# Patient Record
Sex: Female | Born: 1950 | Race: White | Hispanic: No | Smoking: Former smoker
Health system: Southern US, Community
[De-identification: ages and names within clinical notes are randomized; demographics above are authoritative.]

## PROBLEM LIST (undated history)

## (undated) DIAGNOSIS — Z720 Tobacco use: Secondary | ICD-10-CM

## (undated) DIAGNOSIS — F32A Depression, unspecified: Secondary | ICD-10-CM

## (undated) DIAGNOSIS — E785 Hyperlipidemia, unspecified: Secondary | ICD-10-CM

## (undated) DIAGNOSIS — N183 Chronic kidney disease, stage 3 unspecified: Secondary | ICD-10-CM

## (undated) DIAGNOSIS — I2510 Atherosclerotic heart disease of native coronary artery without angina pectoris: Secondary | ICD-10-CM

## (undated) DIAGNOSIS — I5022 Chronic systolic (congestive) heart failure: Secondary | ICD-10-CM

## (undated) DIAGNOSIS — Z8719 Personal history of other diseases of the digestive system: Secondary | ICD-10-CM

## (undated) DIAGNOSIS — I255 Ischemic cardiomyopathy: Secondary | ICD-10-CM

## (undated) DIAGNOSIS — I219 Acute myocardial infarction, unspecified: Secondary | ICD-10-CM

## (undated) DIAGNOSIS — I10 Essential (primary) hypertension: Secondary | ICD-10-CM

## (undated) DIAGNOSIS — I509 Heart failure, unspecified: Secondary | ICD-10-CM

## (undated) DIAGNOSIS — M1990 Unspecified osteoarthritis, unspecified site: Secondary | ICD-10-CM

## (undated) DIAGNOSIS — F329 Major depressive disorder, single episode, unspecified: Secondary | ICD-10-CM

## (undated) HISTORY — DX: Atherosclerotic heart disease of native coronary artery without angina pectoris: I25.10

## (undated) HISTORY — DX: Chronic systolic (congestive) heart failure: I50.22

## (undated) HISTORY — DX: Acute myocardial infarction, unspecified: I21.9

## (undated) HISTORY — DX: Hyperlipidemia, unspecified: E78.5

## (undated) HISTORY — DX: Major depressive disorder, single episode, unspecified: F32.9

## (undated) HISTORY — PX: CORONARY ANGIOPLASTY: SHX604

## (undated) HISTORY — PX: OTHER SURGICAL HISTORY: SHX169

## (undated) HISTORY — DX: Essential (primary) hypertension: I10

## (undated) HISTORY — DX: Depression, unspecified: F32.A

## (undated) HISTORY — DX: Tobacco use: Z72.0

## (undated) HISTORY — DX: Ischemic cardiomyopathy: I25.5

---

## 1998-11-12 ENCOUNTER — Ambulatory Visit (HOSPITAL_COMMUNITY): Admission: RE | Admit: 1998-11-12 | Discharge: 1998-11-12 | Payer: Self-pay | Admitting: Obstetrics and Gynecology

## 1998-11-12 ENCOUNTER — Encounter: Payer: Self-pay | Admitting: Obstetrics and Gynecology

## 1998-12-04 ENCOUNTER — Other Ambulatory Visit: Admission: RE | Admit: 1998-12-04 | Discharge: 1998-12-04 | Payer: Self-pay | Admitting: Obstetrics and Gynecology

## 1999-12-10 ENCOUNTER — Other Ambulatory Visit: Admission: RE | Admit: 1999-12-10 | Discharge: 1999-12-10 | Payer: Self-pay | Admitting: Obstetrics and Gynecology

## 2000-08-06 ENCOUNTER — Encounter: Payer: Self-pay | Admitting: Family Medicine

## 2000-08-06 ENCOUNTER — Encounter: Admission: RE | Admit: 2000-08-06 | Discharge: 2000-08-06 | Payer: Self-pay | Admitting: Family Medicine

## 2000-12-05 ENCOUNTER — Encounter: Payer: Self-pay | Admitting: Emergency Medicine

## 2000-12-06 ENCOUNTER — Inpatient Hospital Stay (HOSPITAL_COMMUNITY): Admission: EM | Admit: 2000-12-06 | Discharge: 2000-12-09 | Payer: Self-pay | Admitting: Emergency Medicine

## 2001-06-14 ENCOUNTER — Other Ambulatory Visit: Admission: RE | Admit: 2001-06-14 | Discharge: 2001-06-14 | Payer: Self-pay | Admitting: Obstetrics and Gynecology

## 2001-06-27 ENCOUNTER — Encounter: Admission: RE | Admit: 2001-06-27 | Discharge: 2001-06-27 | Payer: Self-pay | Admitting: Obstetrics and Gynecology

## 2001-06-27 ENCOUNTER — Encounter: Payer: Self-pay | Admitting: Obstetrics and Gynecology

## 2002-03-01 ENCOUNTER — Encounter: Payer: Self-pay | Admitting: *Deleted

## 2002-03-01 ENCOUNTER — Inpatient Hospital Stay (HOSPITAL_COMMUNITY): Admission: EM | Admit: 2002-03-01 | Discharge: 2002-03-04 | Payer: Self-pay | Admitting: *Deleted

## 2002-08-14 ENCOUNTER — Encounter: Payer: Self-pay | Admitting: Obstetrics and Gynecology

## 2002-08-14 ENCOUNTER — Encounter: Admission: RE | Admit: 2002-08-14 | Discharge: 2002-08-14 | Payer: Self-pay | Admitting: Obstetrics and Gynecology

## 2004-10-29 ENCOUNTER — Ambulatory Visit: Payer: Self-pay | Admitting: Cardiovascular Disease

## 2004-11-07 ENCOUNTER — Ambulatory Visit: Payer: Self-pay

## 2004-11-21 ENCOUNTER — Ambulatory Visit: Payer: Self-pay | Admitting: Cardiovascular Disease

## 2004-12-22 ENCOUNTER — Ambulatory Visit: Payer: Self-pay | Admitting: Cardiovascular Disease

## 2005-03-06 ENCOUNTER — Ambulatory Visit: Payer: Self-pay | Admitting: Cardiovascular Disease

## 2005-09-15 ENCOUNTER — Ambulatory Visit: Payer: Self-pay | Admitting: Cardiovascular Disease

## 2005-11-06 ENCOUNTER — Ambulatory Visit: Payer: Self-pay | Admitting: Cardiovascular Disease

## 2005-11-06 ENCOUNTER — Ambulatory Visit: Payer: Self-pay

## 2006-11-01 ENCOUNTER — Ambulatory Visit: Payer: Self-pay | Admitting: Cardiovascular Disease

## 2006-11-04 ENCOUNTER — Ambulatory Visit: Payer: Self-pay | Admitting: Cardiovascular Disease

## 2006-11-04 ENCOUNTER — Inpatient Hospital Stay (HOSPITAL_BASED_OUTPATIENT_CLINIC_OR_DEPARTMENT_OTHER): Admission: RE | Admit: 2006-11-04 | Discharge: 2006-11-04 | Payer: Self-pay | Admitting: Cardiovascular Disease

## 2006-11-11 ENCOUNTER — Ambulatory Visit: Payer: Self-pay | Admitting: Cardiovascular Disease

## 2008-11-08 ENCOUNTER — Ambulatory Visit: Payer: Self-pay | Admitting: Cardiovascular Disease

## 2008-11-26 ENCOUNTER — Ambulatory Visit (HOSPITAL_COMMUNITY): Admission: RE | Admit: 2008-11-26 | Discharge: 2008-11-26 | Payer: Self-pay | Admitting: Cardiovascular Disease

## 2008-11-26 ENCOUNTER — Ambulatory Visit: Payer: Self-pay | Admitting: Cardiovascular Disease

## 2008-12-28 ENCOUNTER — Ambulatory Visit: Payer: Self-pay | Admitting: Internal Medicine

## 2009-01-17 ENCOUNTER — Ambulatory Visit: Payer: Self-pay | Admitting: Internal Medicine

## 2009-01-17 LAB — CONVERTED CEMR LAB
BUN: 9 mg/dL (ref 6–23)
Basophils Absolute: 0.1 10*3/uL (ref 0.0–0.1)
Basophils Relative: 0.8 % (ref 0.0–3.0)
Chloride: 102 meq/L (ref 96–112)
Creatinine, Ser: 0.7 mg/dL (ref 0.4–1.2)
Eosinophils Absolute: 0.2 10*3/uL (ref 0.0–0.7)
Eosinophils Relative: 2.5 % (ref 0.0–5.0)
GFR calc Af Amer: 110 mL/min
GFR calc non Af Amer: 91 mL/min
HCT: 36.3 % (ref 36.0–46.0)
MCHC: 34.9 g/dL (ref 30.0–36.0)
MCV: 93.1 fL (ref 78.0–100.0)
Monocytes Absolute: 0.7 10*3/uL (ref 0.1–1.0)
Neutrophils Relative %: 63 % (ref 43.0–77.0)
Platelets: 221 10*3/uL (ref 150–400)
Potassium: 3.6 meq/L (ref 3.5–5.1)
WBC: 8.4 10*3/uL (ref 4.5–10.5)
aPTT: 28 s (ref 21.7–29.8)

## 2009-01-18 ENCOUNTER — Inpatient Hospital Stay (HOSPITAL_COMMUNITY): Admission: AD | Admit: 2009-01-18 | Discharge: 2009-01-19 | Payer: Self-pay | Admitting: Internal Medicine

## 2009-01-18 ENCOUNTER — Ambulatory Visit: Payer: Self-pay | Admitting: Internal Medicine

## 2009-01-18 HISTORY — PX: CARDIAC DEFIBRILLATOR PLACEMENT: SHX171

## 2009-02-01 ENCOUNTER — Encounter: Payer: Self-pay | Admitting: Internal Medicine

## 2009-02-04 ENCOUNTER — Ambulatory Visit: Payer: Self-pay

## 2009-05-01 DIAGNOSIS — I10 Essential (primary) hypertension: Secondary | ICD-10-CM

## 2009-05-01 DIAGNOSIS — I2589 Other forms of chronic ischemic heart disease: Secondary | ICD-10-CM

## 2009-05-01 DIAGNOSIS — F329 Major depressive disorder, single episode, unspecified: Secondary | ICD-10-CM

## 2009-05-01 DIAGNOSIS — I219 Acute myocardial infarction, unspecified: Secondary | ICD-10-CM | POA: Insufficient documentation

## 2009-05-01 DIAGNOSIS — I2510 Atherosclerotic heart disease of native coronary artery without angina pectoris: Secondary | ICD-10-CM

## 2009-05-09 ENCOUNTER — Ambulatory Visit: Payer: Self-pay | Admitting: Internal Medicine

## 2009-09-02 ENCOUNTER — Telehealth (INDEPENDENT_AMBULATORY_CARE_PROVIDER_SITE_OTHER): Payer: Self-pay | Admitting: *Deleted

## 2009-11-15 ENCOUNTER — Ambulatory Visit: Payer: Self-pay | Admitting: Internal Medicine

## 2010-08-21 ENCOUNTER — Encounter (INDEPENDENT_AMBULATORY_CARE_PROVIDER_SITE_OTHER): Payer: Self-pay | Admitting: *Deleted

## 2010-10-13 ENCOUNTER — Telehealth: Payer: Self-pay | Admitting: Cardiovascular Disease

## 2010-12-09 ENCOUNTER — Encounter (INDEPENDENT_AMBULATORY_CARE_PROVIDER_SITE_OTHER): Payer: Self-pay | Admitting: *Deleted

## 2010-12-28 ENCOUNTER — Encounter: Payer: Self-pay | Admitting: Cardiovascular Disease

## 2011-01-05 ENCOUNTER — Encounter: Payer: Self-pay | Admitting: Internal Medicine

## 2011-01-06 NOTE — Progress Notes (Signed)
Summary: pt needs refill   Phone Note Refill Request Call back at Home Phone [phone] Message from:  Patient on [employer] home delivery  Refills Requested: Medication #1:  CRESTOR 20 MG TABS Take one tablet by mouth daily. pt needs a 45 day supply she cant afford the 90day  Initial call taken by: Shelda Pal,  October 13, 2010 8:58 AM    Prescriptions: CRESTOR 20 MG TABS (ROSUVASTATIN CALCIUM) Take one tablet by mouth daily.  #45 x 4   Entered by:   Burnett Kanaris   Authorized by:   Bosie Clos, MD, [facility]   Signed by:   Burnett Kanaris on 10/13/2010   Method used:   Faxed to ...       [address] [facility] (mail-order)       [place]  [address]       Ph: [phone]       Fax: [phone]   RxID:   [id number]

## 2011-01-06 NOTE — Letter (Signed)
Summary: Device-Delinquent Check  [place], [place] [address] [place]   [place], [place] [address]   Phone: [phone]  Fax: [phone]     August 21, 2010 MRN: [id number]   [place], [place]  [address]   Dear [name],  According to our records, you have not had your implanted device checked in the recommended period of time.  We are unable to determine appropriate device function without checking your device on a regular basis.  Please call our office to schedule an appointment, with Dr. Ladon Applebaum soon as possible.  If you are having your device checked by another physician, please call us so that we may update our records.  Thank you, Terri Friendly, [name]  September 15, 624THL 3:24 PM   [facility]

## 2011-01-08 NOTE — Letter (Signed)
Summary: Device-Delinquent Check  [place], [place] [address] [place]   [place], [place] [address]   Phone: [phone]  Fax: [phone]     December 09, 2010 MRN: [id number]   [place], [place]  [address]   Dear Ms. Widmann,  According to our records, you have not had your implanted device checked in the recommended period of time.  We are unable to determine appropriate device function without checking your device on a regular basis.  Please call our office to schedule an appointment, with Dr Rayann Heman, as soon as possible.  If you are having your device checked by another physician, please call us so that we may update our records.  Thank you,  Gasper Sells, EMT  December 09, 2010 2:15 PM  [facility]

## 2011-01-28 NOTE — Cardiovascular Report (Signed)
Summary: Certified Letter Returned - Not doing f/u  Certified Letter Returned - Not doing f/u   Imported By: Ranell Patrick 01/19/2011 11:26:12  _____________________________________________________________________  External Attachment:    Type:   Image     Comment:   External Document

## 2011-01-30 ENCOUNTER — Encounter: Payer: Self-pay | Admitting: Cardiovascular Disease

## 2011-01-30 ENCOUNTER — Ambulatory Visit (INDEPENDENT_AMBULATORY_CARE_PROVIDER_SITE_OTHER): Payer: Managed Care, Other (non HMO) | Admitting: Cardiovascular Disease

## 2011-01-30 DIAGNOSIS — Z720 Tobacco use: Secondary | ICD-10-CM | POA: Insufficient documentation

## 2011-01-30 DIAGNOSIS — R0989 Other specified symptoms and signs involving the circulatory and respiratory systems: Secondary | ICD-10-CM | POA: Insufficient documentation

## 2011-01-30 DIAGNOSIS — I2510 Atherosclerotic heart disease of native coronary artery without angina pectoris: Secondary | ICD-10-CM

## 2011-01-30 DIAGNOSIS — I252 Old myocardial infarction: Secondary | ICD-10-CM

## 2011-01-30 DIAGNOSIS — E785 Hyperlipidemia, unspecified: Secondary | ICD-10-CM

## 2011-02-03 NOTE — Assessment & Plan Note (Signed)
Summary: F1Y PER PT CALL-MJ/JT  Medications Added LIPITOR 40 MG TABS (ATORVASTATIN CALCIUM) Take one tablet by mouth daily. PULMICORT FLEXHALER 180 MCG/ACT AEPB (BUDESONIDE) as needed ALLEGRA ALLERGY 180 MG TABS (FEXOFENADINE HCL) 1 tab by mouth once daily ZYBAN 150 MG XR12H-TAB (BUPROPION HCL (SMOKING DETER)) one tablet once daily x 3 days then one tabklet two times a day      Allergies Added: NKDA  Primary Provider:  [facility]    History of Present Illness: 60 yo ischemic DCM with previous intervention to LAD and RCA.  EF by MRI 24% with significant apical and inferior scar.  Prophylactic AICD placed by Dr Rayann Heman.  2/10.  Normal device function on routine device clinic F/U.  She is still depressed.  Mothr died in 08-Aug-2023.  Still smoking.  Has program at work Failed hypnosis and gum.  Told her we would start Zyban and patches.  Link to vascular disease discussed.  No SSCP, dyspnea, palpitaitons or device D/C  Current Problems (verified): 1)  Implantation of Defibrillator, Medtronic  (ICD-V45.02) 2)  Myocardial Infarction  (ICD-410.90) 3)  Depression  (ICD-311) 4)  Hypertension  (ICD-401.9) 5)  Cad  (ICD-414.00) 6)  Cardiomyopathy, Ischemic  (ICD-414.8)  Current Medications (verified): 1)  Furosemide 20 Mg Tabs (Furosemide) .... Take One Tablet By Mouth Daily. 2)  Ramipril 10 Mg Caps (Ramipril) .... Take One Capsule By Mouth Daily 3)  Crestor 10 Mg Tabs (Rosuvastatin Calcium) .... Take One Tablet By Mouth Daily. 4)  Adult Aspirin Low Strength 81 Mg Tbdp (Aspirin) .... Once Daily 5)  Prilosec 20 Mg Cpdr (Omeprazole) .... Take One Once Daily 6)  Paroxetine Hcl 30 Mg Tabs (Paroxetine Hcl) .... Take One Tablet By Mouth Once Daily 7)  Metoprolol Succinate 50 Mg Xr24h-Tab (Metoprolol Succinate) .... Take One Tablet By Mouth Once Daily 8)  Vitamin B-12 Cr 1000 Mcg Cr-Tabs (Cyanocobalamin) .... Take One Tab At Bedtime 9)  Vitamin C 500 Mg Tabs (Ascorbic Acid) .... Take One Tablet By Mouth  Once Daily. 10)  Fish Oil   Oil (Fish Oil) .... Take One Tablet By Mouth Once Daily. 11)  Pulmicort Flexhaler 180 Mcg/act Aepb (Budesonide) .... As Needed 12)  Allegra Allergy 180 Mg Tabs (Fexofenadine Hcl) .Marland Kitchen.. 1 Tab By Mouth Once Daily  Allergies (verified): No Known Drug Allergies  Past History:  Past Medical History: Last updated: 11/15/2009 Current Problems:  IMPLANTATION OF DEFIBRILLATOR, MEDTRONIC (ICD-V45.02) MYOCARDIAL INFARCTION (ICD-410.90) DEPRESSION (ICD-311) HYPERTENSION (ICD-401.9) CAD (ICD-414.00) CARDIOMYOPATHY, ISCHEMIC (ICD-414.8)    Past Surgical History: Last updated: 05/01/2009 implantation of a Medtronic single chamber ICD  01/18/2009  Thompson Grayer, MD Cardiac Catheterization [phone]  Family History: Last updated: 05/01/2009  Her family history is again noncontributory.  Social History: Last updated: 05/01/2009 She is divorced.  She works for [employer].  She has two children, a son of whom died in a car accident at the age of 65.  She does not use alcohol or recreational drugs.  Review of Systems       Denies fever, malais, weight loss, blurry vision, decreased visual acuity, cough, sputum, SOB, hemoptysis, pleuritic pain, palpitaitons, heartburn, abdominal pain, melena, lower extremity edema, claudication, or rash.   Vital Signs:  Patient profile:   60 year old female Height:      63 inches Weight:      136 pounds BMI:     24.18 Pulse rate:   89 / minute Resp:     14 per minute BP sitting:   119 /  60  (left arm)  Vitals Entered By: Burnett Kanaris (January 30, 2011 4:01 PM)  Physical Exam  General:  Affect appropriate Healthy:  appears stated age 60: normal Neck supple with no adenopathy JVP normal left  bruits no thyromegaly Lungs clear with mild wheezing and good diaphragmatic motion Heart:  S1/S2 no murmur,rub, gallop or click PMI normal Abdomen: benighn, BS positve, no tenderness, no AAA no bruit.  No HSM or  HJR Distal pulses intact with no bruits No edema Neuro non-focal Skin warm and dry     ICD Specifications Following MD:  Thompson Grayer, MD     ICD Vendor:  Medtronic     ICD Model Number:  [place]     ICD Serial Number:  [id number] ICD DOI:  01/18/2009     ICD Implanting MD:  Thompson Grayer, MD  Lead 1:    Location: RV     DOI: 01/18/2009     Model #: [id number]     Serial #[id number]     Status: active  Indications::  ICM   Episodes Coumadin:  No  Brady Parameters Mode VVI     Lower Rate Limit:  40      Tachy Zones VF:  200     VT:  off     VT1:  off     Impression & Recommendations:  Problem # 1:  IMPLANTATION OF DEFIBRILLATOR, MEDTRONIC (ICD-V45.02) F/U cllinci normal device funtion  Problem # 2:  CAD (ICD-414.00)  Stable no angina Her updated medication list for this problem includes:    Ramipril 10 Mg Caps (Ramipril) .Marland Kitchen... Take one capsule by mouth daily    Adult Aspirin Low Strength 81 Mg Tbdp (Aspirin) ..... Once daily    Metoprolol Succinate 50 Mg Xr24h-tab (Metoprolol succinate) .Marland Kitchen... Take one tablet by mouth once daily  Her updated medication list for this problem includes:    Ramipril 10 Mg Caps (Ramipril) .Marland Kitchen... Take one capsule by mouth daily    Adult Aspirin Low Strength 81 Mg Tbdp (Aspirin) ..... Once daily    Metoprolol Succinate 50 Mg Xr24h-tab (Metoprolol succinate) .Marland Kitchen... Take one tablet by mouth once daily  Problem # 3:  CARDIOMYOPATHY, ISCHEMIC (ICD-414.8)  No active CHF euvolemic  Cotninue current meds Her updated medication list for this problem includes:    Furosemide 20 Mg Tabs (Furosemide) .Marland Kitchen... Take one tablet by mouth daily.    Ramipril 10 Mg Caps (Ramipril) .Marland Kitchen... Take one capsule by mouth daily    Adult Aspirin Low Strength 81 Mg Tbdp (Aspirin) ..... Once daily    Metoprolol Succinate 50 Mg Xr24h-tab (Metoprolol succinate) .Marland Kitchen... Take one tablet by mouth once daily  Her updated medication list for this problem includes:    Furosemide 20 Mg Tabs  (Furosemide) .Marland Kitchen... Take one tablet by mouth daily.    Ramipril 10 Mg Caps (Ramipril) .Marland Kitchen... Take one capsule by mouth daily    Adult Aspirin Low Strength 81 Mg Tbdp (Aspirin) ..... Once daily    Metoprolol Succinate 50 Mg Xr24h-tab (Metoprolol succinate) .Marland Kitchen... Take one tablet by mouth once daily  Problem # 4:  CAROTID BRUIT, LEFT (ICD-785.9) Knwon vascular diseae and smoking Carotid duplex ordered  Problem # 5:  HYPERTENSION (ICD-401.9)  Well controlled Her updated medication list for this problem includes:    Furosemide 20 Mg Tabs (Furosemide) .Marland Kitchen... Take one tablet by mouth daily.    Ramipril 10 Mg Caps (Ramipril) .Marland Kitchen... Take one capsule by mouth daily    Adult Aspirin Low  Strength 81 Mg Tbdp (Aspirin) ..... Once daily    Metoprolol Succinate 50 Mg Xr24h-tab (Metoprolol succinate) .Marland Kitchen... Take one tablet by mouth once daily  Her updated medication list for this problem includes:    Furosemide 20 Mg Tabs (Furosemide) .Marland Kitchen... Take one tablet by mouth daily.    Ramipril 10 Mg Caps (Ramipril) .Marland Kitchen... Take one capsule by mouth daily    Adult Aspirin Low Strength 81 Mg Tbdp (Aspirin) ..... Once daily    Metoprolol Succinate 50 Mg Xr24h-tab (Metoprolol succinate) .Marland Kitchen... Take one tablet by mouth once daily  Problem # 6:  SMOKER (ICD-305.1) F/U work place paln  Zyban started and encouraged to use 14mg  patch  Other Orders: Carotid Duplex (Carotid Duplex)  Patient Instructions: 1)  Your physician has recommended you make the following change in your medication: stop crestor 2)  START LIPITOR 40MG  ONCE DAILY 3)  Your physician wants you to follow-up in: [place] will receive a reminder letter in the mail two months in advance. If you don't receive a letter, please call our office to schedule the follow-up appointment. 4)  Your physician has requested that you have a carotid duplex. This test is an ultrasound of the carotid arteries in your neck. It looks at blood flow through these arteries that  supply the brain with blood. Allow one hour for this exam. There are no restrictions or special instructions. Prescriptions: ZYBAN 150 MG XR12H-TAB (BUPROPION HCL (SMOKING DETER)) one tablet once daily x 3 days then one tabklet two times a day  #64 x 1   Entered by:   Fredia Beets, RN   Authorized by:   Bosie Clos, MD, [facility]   Signed by:   Fredia Beets, RN on 01/30/2011   Method used:   Print then Give to Patient   RxID:   [id number] LIPITOR 40 MG TABS (ATORVASTATIN CALCIUM) Take one tablet by mouth daily.  #90 x 4   Entered by:   Fredia Beets, RN   Authorized by:   Bosie Clos, MD, [facility]   Signed by:   Fredia Beets, RN on 01/30/2011   Method used:   Faxed to ...       [address] [facility] (mail-order)       [place]  [address]       Ph: [phone]       Fax: [phone]   RxID:   [id number]

## 2011-02-04 ENCOUNTER — Encounter: Payer: Self-pay | Admitting: Internal Medicine

## 2011-02-13 ENCOUNTER — Encounter: Payer: Self-pay | Admitting: Cardiovascular Disease

## 2011-02-13 ENCOUNTER — Encounter (INDEPENDENT_AMBULATORY_CARE_PROVIDER_SITE_OTHER): Payer: Managed Care, Other (non HMO)

## 2011-02-13 DIAGNOSIS — I6529 Occlusion and stenosis of unspecified carotid artery: Secondary | ICD-10-CM

## 2011-02-13 DIAGNOSIS — R0989 Other specified symptoms and signs involving the circulatory and respiratory systems: Secondary | ICD-10-CM

## 2011-03-12 ENCOUNTER — Telehealth: Payer: Self-pay | Admitting: Cardiovascular Disease

## 2011-03-12 NOTE — Telephone Encounter (Signed)
Furosemide 30 day supply to [facility]

## 2011-03-13 MED ORDER — FUROSEMIDE 20 MG PO TABS: 20.0000 mg | ORAL_TABLET | Freq: Every day | ORAL | Status: DC

## 2011-03-18 ENCOUNTER — Other Ambulatory Visit: Payer: Self-pay | Admitting: *Deleted

## 2011-03-19 ENCOUNTER — Other Ambulatory Visit: Payer: Self-pay | Admitting: *Deleted

## 2011-03-19 MED ORDER — FUROSEMIDE 20 MG PO TABS: 20.0000 mg | ORAL_TABLET | Freq: Every day | ORAL | Status: DC

## 2011-04-01 ENCOUNTER — Ambulatory Visit (INDEPENDENT_AMBULATORY_CARE_PROVIDER_SITE_OTHER): Payer: Managed Care, Other (non HMO) | Admitting: Internal Medicine

## 2011-04-01 ENCOUNTER — Encounter: Payer: Self-pay | Admitting: Internal Medicine

## 2011-04-01 DIAGNOSIS — I2589 Other forms of chronic ischemic heart disease: Secondary | ICD-10-CM

## 2011-04-01 DIAGNOSIS — I5023 Acute on chronic systolic (congestive) heart failure: Secondary | ICD-10-CM | POA: Insufficient documentation

## 2011-04-01 DIAGNOSIS — I10 Essential (primary) hypertension: Secondary | ICD-10-CM

## 2011-04-01 DIAGNOSIS — I509 Heart failure, unspecified: Secondary | ICD-10-CM

## 2011-04-01 DIAGNOSIS — F17200 Nicotine dependence, unspecified, uncomplicated: Secondary | ICD-10-CM

## 2011-04-01 DIAGNOSIS — I428 Other cardiomyopathies: Secondary | ICD-10-CM

## 2011-04-01 NOTE — Assessment & Plan Note (Signed)
No ischemic symptoms. Normal defibrillator function See Claudia Desanctis Art report No changes today

## 2011-04-01 NOTE — Patient Instructions (Signed)
Take lasix 40mg  every day for 3 days and then back to 20mg  every day. Start 2gram sodium per day diet. Smoking  Cessation program. Call [phone] or [email or web address] Your physician wants you to follow-up in:  12 months You will receive a reminder letter in the mail two months in advance. If you don't receive a letter, please call our office to schedule the follow-up appointment.

## 2011-04-01 NOTE — Assessment & Plan Note (Signed)
She appears slightly volume overloaded today and optivol is elevated.  She also reports symptoms of CHF, though these are mild. I have increased lasix to 40mg  daily today x 3 days.  She will then weight daily and increase lasix if weight increases by more than 2 lbs. 2 gram salt restriction advised.

## 2011-04-01 NOTE — Progress Notes (Signed)
The patient presents today for routine electrophysiology followup.  Since last being seen in our clinic, the patient reports doing very well.  She reports mild SOB with abdominal distension and orthopnea x 2-3 weeks.  She is unware of any increase in salt and reports compliance with medicine.  Today, she denies symptoms of palpitations, chest pain,lower extremity edema, dizziness, presyncope, syncope, or neurologic sequela.  The patient feels that she is tolerating medications without difficulties and is otherwise without complaint today.   Past Medical History  Diagnosis Date  . Ischemic cardiomyopathy   . CAD (coronary artery disease)   . Chronic systolic dysfunction of left ventricle   . HTN (hypertension)   . Depression    Past Surgical History  Procedure Date  . Cardiac defibrillator placement 01/18/09    [facility]T implanted by [facility]    Current Outpatient Prescriptions  Medication Sig Dispense Refill  . aspirin 81 MG tablet Take 81 mg by mouth daily.        Marland Kitchen atorvastatin (LIPITOR) 40 MG tablet Take 40 mg by mouth daily.        . Budesonide (PULMICORT IN) Inhale into the lungs as directed.        . Cyanocobalamin (VITAMIN B-12 CR PO) Take by mouth as directed.        . fish oil-omega-3 fatty acids 1000 MG capsule Take 2 g by mouth daily.        . furosemide (LASIX) 20 MG tablet Take 1 tablet (20 mg total) by mouth daily.  90 tablet  1  . metoprolol (TOPROL-XL) 50 MG 24 hr tablet Take 50 mg by mouth daily.        Marland Kitchen omeprazole (PRILOSEC) 10 MG capsule Take 10 mg by mouth daily.        Marland Kitchen PARoxetine (PAXIL) 30 MG tablet Take 30 mg by mouth every morning.        . ramipril (ALTACE) 10 MG capsule Take 10 mg by mouth daily.          Not on File  History   Social History  . Marital Status: Divorced    Spouse Name: N/A    Number of Children: N/A  . Years of Education: N/A   Occupational History  . Not on file.   Social History Main Topics  . Smoking status: Current Everyday Smoker  .  Smokeless tobacco: Not on file  . Alcohol Use: No  . Drug Use: No  . Sexually Active: Not on file   Other Topics Concern  . Not on file   Social History Narrative   Lives in [place]    No family history on file.  ROS-  All systems are reviewed and are negative except as outlined in the HPI above  Physical Exam: Filed Vitals:   04/01/11 1309  BP: 100/60  Pulse: 70  Height: 5\' 3"  (1.6 m)  Weight: 135 lb (61.236 kg)    GEN- The patient is well appearing, alert and oriented x 3 today.   Head- normocephalic, atraumatic Eyes-  Sclera clear, conjunctiva pink Ears- hearing intact Oropharynx- clear Neck- supple, JVP9 Lymph- no cervical lymphadenopathy Lungs- few basilar rales, normal work of breathing Chest- ICD pocket is well healed Heart- Regular rate and rhythm, no murmurs, rubs or gallops, PMI not laterally displaced GI- soft, NT, ND, + BS Extremities- no clubbing, cyanosis, or edema MS- no significant deformity or atrophy Skin- no rash or lesion Psych- euthymic mood, full affect Neuro- strength and sensation  are intact  ICD interrogation- reviewed in detail today,  See PACEART report  Assessment and Plan:

## 2011-04-01 NOTE — Assessment & Plan Note (Signed)
Cessation advised today She is not ready to quite

## 2011-04-01 NOTE — Assessment & Plan Note (Signed)
Controlled.  

## 2011-04-02 ENCOUNTER — Encounter: Payer: Self-pay | Admitting: Internal Medicine

## 2011-04-08 ENCOUNTER — Telehealth: Payer: Self-pay | Admitting: Internal Medicine

## 2011-04-08 NOTE — Telephone Encounter (Signed)
Pt. States will fax FMLA papers tomorrow to be fill out and sign.

## 2011-04-08 NOTE — Telephone Encounter (Signed)
Pt needs her fmla paper to be fill out.  Pt is going to be faxing fmla paper today.

## 2011-04-21 NOTE — Assessment & Plan Note (Signed)
[facility]                            CARDIOLOGY OFFICE NOTE   NAME:Lowman, NAZARI TOURANGEAU                        MRN:          [id number]  DATE:11/08/2008                            DOB:          Oct 08, 1951    [name] is seen today in followup.  Unfortunately, last time I saw her was  2 years ago.  She has been under somewhat of a lot of stress.  Her  mother is elderly and has had problems with dementia, hip fracture, and  she needs to work and see her mom on a daily basis.  The patient was  cathed in November 2007.  She has had a previous anterior wall MI with  angioplasty of the LAD and also angioplasty of the RCA.  She had patent  arteries by cath on November 2007.  Unfortunately at that time, however,  her EF was in the 35% range.  Given her coronary artery disease and  ischemic cardiomyopathy, I wanted to restratify her at that time either  in the determine protocol or using MRI to see if she would be a  candidate for defibrillator.  She never followed up with this and never  kept her appointment with Dr. Caryl Comes.  I explained to Shontia that there is  some significant issues regarding her current heart condition and sudden  death.  I still think she should have a cardiac MRI to restratify her.  If EF is below 35% and she has a large scar burden, I still would like  to refer her to possible AICD.   She otherwise has been doing well.  She is not having any significant  chest pain.  She has mild exertional dyspnea.  There has been no  presyncope or palpitations.   She has no allergies.   CURRENT MEDICATIONS:  1. Prozac 20 a day.  2. Toprol 50 a day.  3. Lasix 20 a day.  4. Altace 10.  5. Aspirin a day.  6. Prevacid 30 a day.  7. Zetia 10 a day.  8. Crestor 10 a day.   PHYSICAL EXAMINATION:  GENERAL:  Remarkable for somewhat depressed-  looking female in no distress.  VITAL SIGNS:  Weight is 136, blood pressure 140/82, pulse 75 and  regular,  respiratory rate is 14, afebrile.  HEENT:  Unremarkable.  She has a left carotid bruit just above the  clavicle.  No lymphadenopathy, thyromegaly, or JVP elevation.  LUNGS:  Clear with good diaphragmatic motion.  No wheezing.  HEART:  S1 and S2 with normal heart sounds.  PMI normal.  ABDOMEN:  Benign.  Bowel sounds positive.  No AAA, no tenderness, no  bruit, no hepatosplenomegaly, no hepatojugular reflux, no tenderness.  EXTREMITIES:  Distal pulses are intact.  No edema.  NEURO:  Nonfocal.  SKIN:  Warm and dry.  MUSCULOSKELETAL:  No muscle weakness.   IMPRESSION:  1. Ischemic cardiomyopathy.  Followup cardiac MRI to assess EF and      scar burden possible referral to EP if she has significant scar      burden or decreased EF.  2. Hypertension, currently well controlled.  Continue current dose of      diuretic and ACE inhibitor.  3. Hypercholesterolemia.  Lipid and liver profile in 6 months.      Continue Zetia and Crestor.  4. Left carotid bruit.  Looking back to our notes, she did have a      carotid duplex study in 2007.  At that time, there was an ECA      stenosis but no ICA stenosis.  I suspect this is a murmur that I      continue to hear.  I do not think she needs a followup carotid      duplex at this time.   Overall I think, the patient is stable.  Further recommendations will be  based on the results of her cardiac MRI.  Her EKG today did show sinus  rhythm with a previous anterior and inferior wall infarction.     [name]. Johnsie Cancel, MD, [facility]  Electronically Signed    PCN/MedQ  DD: 11/08/2008  DT: 11/09/2008  Job #: [id number]

## 2011-04-21 NOTE — Letter (Signed)
December 28, 2008    [name]. Johnsie Cancel, [place], [address], [place] [address]   RE:  Terri, Bowen  MRN:  [id number]  /  DOB:  [birth year]   Dear Collier Salina,   It was my pleasure to see your patient [name] in Electrophysiology  consultation today for consideration of ICD implantation.  As you  recall, 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  She is  status post distant anterior myocardial infarction with stenting of the  left anterior descending and diagonal arteries in 2001.  She had a  repeat left heart catheterization in November 2007, which revealed an  ejection fraction of 35% with patent stents and no significant  progression of her coronary artery disease.  She most recently had a  cardiac MRI performed on November 26, 2008, which revealed severe left  ventricular cavitary enlargement with an inferolateral and apical scar  and an ejection fraction of 24%.  There was noted to be full-thickness  scar of the inferolateral wall, distal septum, and apex.  A 2/3  thickness scar was also noted over the mid and apical inferior wall.  She is therefore referred for consideration of an implantable cardiac  defibrillator.   The patient reports doing quite well.  She typically is limited due to  shortness breath and leg weakness with moderate activities.  She denies  chest pain, palpitations, presyncope, or syncope.  She has not had  significant orthopnea, paroxysmal nocturnal dyspnea, or lower extremity  edema.  She is chronically compliant with her medical therapy for her  ischemic cardiomyopathy and has been maintaining an optimal medical  therapy.  Unfortunately, she continues to smoke, but does have interest  in cessation.   PAST MEDICAL  HISTORY:  1. Coronary artery disease, status post percutaneous coronary      intervention of the LAD.  2. Ischemic cardiomyopathy (as above).  3. New York Heart Association class II/III heart failure symptoms      chronically.  4. Hypertension.  5. Depression.   ALLERGIES:  No known drug allergies.   MEDICATIONS:  1. Aspirin 81 mg daily.  2. Toprol-XL 50 mg daily.  3. Lasix 20 mg daily.  4. Altace 10 mg daily.  5. Paroxetine 20 mg daily.  6. Prevacid 30 mg daily.  7. Zetia 10 mg daily.  8. Crestor 10 mg daily.  9. Vitamin D daily.  10.Vitamin B12 daily.  11.Albuterol p.r.n.  12.Nitroglycerin p.r.n.   SOCIAL HISTORY:  The patient lives in [place].  She works in  Geophysical data processor for [employer].  She has a 40 pack-year history of tobacco, which is  ongoing.  She denies alcohol or drug use.   FAMILY HISTORY:  Notable for diabetes, coronary artery disease,  cerebrovascular disease, and cancer.   REVIEW OF SYSTEMS:  All systems reviewed and negative except as outlined  in the HPI above.   PHYSICAL EXAMINATION:  VITAL SIGNS:  Blood pressure 130/70, heart rate  96, respirations 18, and weight 137 pounds.  GENERAL:  The patient is a thin, well-appearing female in no acute  distress.  She is alert and oriented x3.  HEENT:  Normocephalic and atraumatic.  Sclerae clear.  Conjunctivae  pink.  Oropharynx clear.  NECK:  Supple.  No JVD, thyromegaly, or  bruits.  LUNGS:  Clear to auscultation bilaterally.  HEART:  Regular rate and rhythm.  No murmurs, rubs, or gallops.  GI:  Soft, nontender, and nondistended.  Positive bowel sounds.  EXTREMITIES:  No clubbing, cyanosis, or edema.  NEUROLOGIC:  Strength and sensation are intact.  SKIN:  No ecchymosis or lacerations.  MUSCULOSKELETAL:  No deformity or atrophy.  PSYCH:  Euthymic mood and full affect.   EKG performed on November 08, 2008, reveals sinus rhythm with a PR  interval of 180 milliseconds.  The QRS duration is 94 milliseconds.  An   anterolateral infarct pattern is observed.  Left axis deviation is also  noted.   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  She meets SCD-HeFT made it to  criteria for implantable cardioverter-defibrillator implantation.  Risks, benefits, and alternatives to implantable cardioverter-  defibrillator implantation were discussed in detail with the patient  today.  These risk include but are not limited to infection, bleeding,  vascular damage, pericardial effusion, tamponade, pneumothorax, lead  dislodgement, cardiac perforation, and malfunctioning/recall of the  device.  She is also aware that inappropriate shocks could possibly be  delivered; however, device programming should minimize this.  She  understands these risks and wishes to proceed.   PLAN:  We will schedule the patient for a single chamber ICD on January 18, 2009.     Sincerely,      Thompson Grayer, MD  Electronically Signed    JA/MedQ  DD: 12/28/2008  DT: 12/29/2008  Job #: [id number]   CC:    Stanford [place], FNP

## 2011-04-21 NOTE — Op Note (Signed)
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   PREPROCEDURE DIAGNOSES:  1. Ischemic cardiomyopathy.  2. Coronary artery disease.  3. New York Heart Association class II/III heart failure.   POSTPROCEDURE DIAGNOSES:  1. Ischemic cardiomyopathy.  2. Coronary artery disease.  3. New York Heart Association class II/III heart failure.   PROCEDURES:  1. ICD implantation.  2. Programmed extra stimulus testing.   DESCRIPTION OF PROCEDURE:  Informed written consent was obtained and the  patient was brought to the Electrophysiology Lab in a fasting state.  She was adequately sedated with intravenous Versed and fentanyl as  outlined in the nursing report.  The patient's left chest was prepped  and draped in the usual sterile fashion by the EP lab staff.  The skin  overlying the left deltopectoral region was infiltrated with lidocaine  for local analgesia.  A 5-cm incision was then made over the left  deltopectoral region.  The left subcutaneous pacemaker pocket was  fashioned using a combination of sharp and blunt dissection.  Electrocautery was used to assure hemostasis.  The left cephalic vein  was visualized and cannulated.  Through this vein, a Medtronic [employer] operational officer model [id number] (serial number [id number]) defibrillator  lead was advanced into the right atrial appendage with fluoroscopic  visualization.  The  lead was then actively secured to the pectoralis  fascia using #2 silk suture over the suture sleeve.  Initial lead  measurements revealed an R-wave of 14.5 mV with an impedance of 1081  ohms and a threshold of 0.9 volts at 0.5 milliseconds.  The lead was  then connected to a Medtronic Virtuoso VR model D154VWC (serial number  [id number]) single chamber ICD.  The ICD pocket was irrigated with  copious gentamicin solution.  The device was then placed into the  pocket.  The pocket was closed with 2.0 Vicryl suture for the  subcutaneous and subcuticular layers.  Steri-Strips and a sterile  dressing were then applied.  Ventricular fibrillation was induced with a  T-shock and ventricular fibrillation was adequately detected with no  signal dropout with a sensitivity programmed at 1.2 mV.  The patient was  successfully defibrillated with a single 10 joules shock delivered  through the device with an impedance of 75 ohms.  Programmed extra  stimulus testing was performed with a basic cycle length of 500  milliseconds through the device from the right ventricular apex with S1,  S2, S3, S4 extrastimuli down to refractoriness with no ventricular  tachycardia  induced.  The procedure was therefore considered completed.  There were no early apparent complications.   CONCLUSIONS:  1. Successful implantation of a Medtronic single chamber ICD for      primary prevention of sudden cardiac death as described above.  2. Defibrillation threshold less than or equal to 10 joules.  3. No inducible ventricular tachycardia with programmed extra stimulus      testing with a basic cycle length of 500 milliseconds with S1, S2,      S3, S4 extrastimuli down to refractoriness.  4. No early apparent complications.      Thompson Grayer, MD  Electronically Signed     JA/MEDQ  D:  01/18/2009  T:  01/19/2009  Job:  [id number]   cc:   [name]. Johnsie Cancel, MD, [facility]  Maureen Dr. Armandina Gemma

## 2011-04-21 NOTE — Discharge Summary (Signed)
[name], [name]                 ACCOUNT NO.:  [id number]   MEDICAL RECORD NO.:  [id number]          PATIENT TYPE:  INP   LOCATION:  2041                         FACILITY:  [facility]   PHYSICIAN:  Satira Sark, MD DATE OF BIRTH:  12-03-1951   DATE OF ADMISSION:  01/18/2009  DATE OF DISCHARGE:  01/19/2009                               DISCHARGE SUMMARY   PRIMARY CARDIOLOGIST:  Collier Salina C. Johnsie Cancel, MD, [facility]   PRIMARY ELECTROPHYSIOLOGIST:  Thompson Grayer, MD   FINAL DISCHARGE DIAGNOSES:  1. Severe ischemic cardiomyopathy.      a.     Status post Medtronic single-chamber defibrillator, by Dr.       Thompson Grayer, for primary prophylaxis.      b.     Ejection fraction approximately 25%.      c.     New York Heart Association Class II/III heart failure       symptoms.      d.     Status post remote anterior myocardial infarction, treated       with percutaneous intervention of left anterior descending and       diagonal arteries in 2001.   SECONDARY DIAGNOSES:  1. Chronic systolic heart failure.  2. Hypertension.  3. Dyslipidemia.  4. Depression.   REASON FOR ADMISSION:  [name] is a 60 year old female, followed by  Dr. Jenkins Rouge, with history of severe ischemic cardiomyopathy.  She  was recently referred to Dr. Thompson Grayer for formal EP evaluation  regarding risk stratification for sudden cardiac death.  Recommendation  was to proceed with defibrillator implantation.   HOSPITAL COURSE:  The patient underwent placement of a Medtronic single-  chamber defibrillator, by Dr. Thompson Grayer, on day of admission, with no  noted complications.  She was kept for overnight observation, cleared  for discharge the following morning in hemodynamically stable condition.  Postop ICD interrogation notable for no adjustments indicated.   The patient will resume all previous home medications.   Postop chest x-ray was negative.   DISCHARGE LABORATORIES:  None.   DISCHARGE MEDICATIONS:  1.  Toprol-XL 50 mg daily.  2. Furosemide 20 mg daily.  3. Altace 10 mg daily.  4. Aspirin 81 mg daily.  5. Prevacid 30 mg daily.  6. Crestor 10 mg daily.  7. Zetia 10 mg daily.  8. Paxil 20 mg daily.   INSTRUCTIONS:  1. The patient is referred to standard pacemaker discharge sheet for      complete instructions, including restrictions with respect to      showering, driving, and raising of left arm.  2. Arrangements will be made for the patient to follow up in the      [facility] in approximately 10 days, for wound      check.  She will then be scheduled for a followup with Dr. Thompson Grayer in approximately 3 months.  3. The patient is to follow up with Dr. Jenkins Rouge, as previously      scheduled.   DISCHARGE DISPOSITION:  Stable.  DISCHARGE DURATION:  Greater than 30 minutes.      Gene Serpe, PA-C      Satira Sark, MD  Electronically Signed    GS/MEDQ  D:  01/19/2009  T:  01/19/2009  Job:  [id number]

## 2011-04-24 NOTE — H&P (Signed)
[place]  Patient:    [name], [name] Visit Number: [id number] MRN: [id number]          Service Type: MED Location: [phone] Attending Physician:  [name] Dictated by:   [name], M.D., [facility] Admit Date:  03/01/2002   CC:         [name]. [name], M.D.   History and Physical  HISTORY OF PRESENT ILLNESS:  [name] is a 60 year old woman who presents to the emergency department with chest pain that began this evening at 6 oclock. She has a history of an acute myocardial infarction on December 06, 2000 that was associated with a total occlusion of her LAD and her diagonal, both of which were angioplastied.  She had residual EF of 35%.  She has had no chest pain until today, when she developed chest pain that was retrosternal with radiation into the neck and both arms.  It was unassociated with shortness of breath, diaphoresis or nausea.  There was some associated dizziness.  It was really quite intense and she uses a Levine sign to describe the discomfort. She has had an intercurrent sinus infection with drainage and cough.  She has had progressive epigastric discomfort of late with antecedent history of GE reflux disease and has had a brackish taste which she attributes this to her sinus infection.  Her cardiac risk factors include ongoing cigarette abuse, she is hypertensive and cholesterolemic, on Lipitor therapy.  She does not have diabetes.  PAST MEDICAL HISTORY:  Her past medical history is notable primarily as above.  REVIEW OF SYSTEMS:  Her review of systems is notable as above and is otherwise noncontributory.  MEDICATIONS:  Her medications, for which she did not bring doses, include Altace, Toprol, Lasix, Paxil, aspirin, Prevacid once a day and she also takes Lipitor 20 mg, recently increased.  FAMILY HISTORY:  Her family history is again noncontributory.  SOCIAL HISTORY:  She is divorced.  She  works for [employer].  She has two children, a son of whom died in a car accident at the age of 34.  She does not use alcohol or recreational drugs.  PHYSICAL EXAMINATION:  GENERAL:  She is a young, middle-aged Caucasian female who appears older than her stated age.  VITAL SIGNS:  Her blood pressure is 132/66.  Her pulse is 73.  HEENT:  No scleral icterus or xanthomata.  NECK:  Her neck was supple.  Her neck veins were flat.  Her carotids were brisk with bilateral bruits.  BACK:  Without kyphosis or scoliosis.  LUNGS:  Clear.  HEART:  Heart sounds were regular without murmurs or gallops.  ABDOMEN:  Soft with epigastric and diffuse abdominal tenderness.  There was no midline pulsation or hepatomegaly.  EXTREMITIES:  Femoral pulses were 2+.  Distal pulses were intact.  There was no clubbing, cyanosis, or edema.  NEUROLOGIC:  Exam was grossly normal.  LABORATORY AND ACCESSORY DATA:  Electrocardiogram demonstrated sinus rhythm at 70 beats per minute with intervals of 0.16/0.09/0.37 with axis that was leftward and -76.  There were T wave inversions in II, III and V and V1 to V5; all the changes similar qualitatively to the electrocardiograms from her post myocardial infarction.  The T wave inversions are much less notable now than before.  IMPRESSION: 1. Chest pain with typical features similar to her prior myocardial    infarction. 2. Status post anterior wall myocardial infarction with ejection fraction of  35% with left anterior descending and first diagonal percutaneous    transluminal coronary angioplasty. 3. Epigastric discomfort with a history of gastroesophageal reflux disease. 4. Sinus infection with fevers, chills and cough. 5. Lipids, hypertension, ongoing cigarette abuse. 6. Abnormal electrocardiogram.  [name] has chest pain that is suggestive of her acute ischemic syndrome. Her electrocardiogram is abnormal, although it may be incomplete resolution from  her myocardial infarction electrocardiogram.  Other potential causes for this would include a gastrointestinal cause as well as infection cause, given her symptoms as outlined above.  Based on the above, therefore we will: 1. Admit for serial enzymes, heparin and nitroglycerin. 2. If her enzymes are positive or if her electrocardiographic changes are new    compared to an intercurrent electrocardiogram, I would submit her for    catheterization; if these are both negative, I would undertake a Cardiolite    scan. 3. Will increase her prednisone. 4. Antibiotics for sinus infection.Dictated by:   [name], M.D., [facility] Attending Physician:  [name] DD:  03/01/02 TD:  03/02/02 Job: [id number]

## 2011-04-24 NOTE — Assessment & Plan Note (Signed)
[facility]                            CARDIOLOGY OFFICE NOTE   NAME:[name], [name]                        MRN:          [id number]  DATE:11/01/2006                            DOB:          03/19/1951    The patient returns today for followup.  She has had a previous ischemic  cardiomyopathy with improvement in her EF to 50%.   She had an anterior wall MI which was fairly large in 2001 treated with  stenting of the LAD and diagonal.   She had a SEMI in 2003 with occlusion of the PDA which was treated  medically.  Over the last 2 months, particularly the last 2-3 weeks, she  has had recurrent angina that reminds her of a previous MI.  Although  the episodes have been self-limited, they have been fairly frequent,  occurring almost on a daily basis.  She is under a lot of stress lately.  She has a 62-year-old grandson who may be autistic.   The patient has taken a nitroglycerin on 2 occasions with relief of her  pain fairly quickly.  She has not had to take more than one  nitroglycerin.   She has been compliant with her medications which include:  1. Paroxetine 20 mg daily.  2. Toprol 50 mg daily.  3. Lasix 20 daily.  4. Altace 10 daily.  5. Aspirin daily.  6. Prevacid 30 daily.  7. Zetia 10 daily.  8. Crestor 20 daily.   REVIEW OF SYSTEMS:  Remarkable for no significant palpitations, PND,  orthopnea, or lower extremity edema.   Her LV function has improved to 50% by most recent echo.  She has not  been on Plavix and her previous stent was non-drug-eluting.   EXAM:  Blood pressure 120/70.  Pulse was 70 and regular.  HEENT:  Normal.  There are no carotid bruits.  There is no lymphadenopathy.  There is no  thyromegaly.  LUNGS:  Clear.  There is a S1 and S2 with normal heart sounds.  ABDOMEN:  Benign.  LOWER EXTREMITIES:  Intact pulses, no edema.  NEUROLOGIC:  Nonfocal.  SKIN:  Warm and dry.   EKG shows sinus rhythm with previous  anterolateral wall MI.  She has had  persistent T-wave inversions across her precordium since her MI.   IMPRESSION:  Recurrent angina in a patient with known coronary artery  disease requiring nitroglycerin.  I spoke at length to [facility] about  repeat heart catheterization.  I think this is important.  She had  recovery of left ventricular function after a large anterior wall  myocardial infarction and I would hate to lose progress with her.  On  her repeat catheter in 2003, she had 50% residual left anterior  descending disease at the site of her previous total occlusion.  The  patient will continue her current medications.  Since there is a  potential for needing open heart surgery, we will not start her on  Plavix.  She knows to go to the hospital for any prolonged episodes.  She will have a chest x-ray and  laboratory work today.  Further  recommendations will be based on the results of her heart  catheterization.     [name]. Johnsie Cancel, MD, [facility]  Electronically Signed    PCN/MedQ  DD: 11/01/2006  DT: 11/01/2006  Job #: [id number]

## 2011-04-24 NOTE — Assessment & Plan Note (Signed)
[facility]                            CARDIOLOGY OFFICE NOTE   NAME:Bowen, Terri BIERNACKI                        MRN:          [id number]  DATE:11/11/2006                            DOB:          1951-07-08    [name] returns today for follow-up.  She has a history of anterior wall  MI back, I believe, in 1995.  At the time her EF was 50%; however,  recently her ejection fraction seems to have decreased with some  remodeling.  I did a catheterization on her on November 04, 2006, for  increasing shortness of breath and chest pain.  Her coronary arteries  were essentially patent without critical disease; however, her EF  appeared to be in the 35% range with significant anterior and apical  wall hypokinesis.  The patient is on reasonable medical therapy  including beta blockers, Lasix, ACE inhibitors, and aspirin.  I told her  that I wanted her to have a DETERMINE protocol MRI to quantitate her EF  and extent of scar.  She has an appointment to see Dr. Caryl Comes on the 13th  to discuss possibilities of a defibrillator.   MEDICATIONS:  1. Paxil 20 mg a day.  2. Toprol 50 mg a day.  3. Lasix 20 mg a day.  4. Altace 10 mg a day.  5. An aspirin a day.  6. Prevacid 30 mg a day.  7. Zetia 10 mg a day.  8. Crestor 10 mg a day.   REVIEW OF SYSTEMS:  She does not have PND, orthopnea, palpitations or  syncope.  There has been no evidence of frequent ambient ventricular  ectopy.   PHYSICAL EXAMINATION:  VITAL SIGNS:  Her blood pressure is 120/70, pulse  70 and regular.  HEENT:  Normal.  NECK:  There are no carotid bruits, no lymphadenopathy.  LUNGS:  Clear.  CARDIAC:  There is an S1, S2, with normal heart sounds.  ABDOMEN:  Benign.  Catheterization site is well-healed with minimal  ecchymosis laterally.  EXTREMITIES:  Distal pulses are intact with no edema.  NEUROLOGIC:  Nonfocal.   IMPRESSION:  Ischemic cardiomyopathy at 35%, catheterization showing no  residual  disease.  Cardiac MRI to quantitate ejection fraction and  examine the extent of scar tissue.  Follow up with Dr. Caryl Comes next week  for possible defibrillator.    [name]. Johnsie Cancel, MD, [facility]  Electronically Signed   PCN/MedQ  DD: 11/11/2006  DT: 11/12/2006  Job #: [id number]

## 2011-04-24 NOTE — Discharge Summary (Signed)
[place]. [facility]  Patient:    [name], [name]                        MRN: [id number] Adm. Date:  [id number] Disc. Date: 12/09/00 Attending:  Cristopher Peru Dictator:   Tad Moore, P.A. CC:         Stanford Scotland, M.D., [facility], [place], [place], [place] [address]                  Referring Physician Discharge [place]  DISCHARGE DIAGNOSES:  1. Status post anterior lateral wall myocardial infarction.  2. Status post percutaneous transluminal coronary angioplasty x 3.  3. Left ventricular dysfunction, congestive heart failure:  Ejection     fraction 35%.  4. Hyperlipidemia.  5. Gastroesophageal reflux disease.  6. Sinusitis.  ADMISSION HISTORY:  The patient presented to the emergency room on December 06, 2000, complaining of substernal chest pain radiating to both arms.  She was seen and admitted by Dr. Crissie Sickles with diagnosis of rule out MI.  She was seen later that day by Dr. Jenkins Rouge.  After reviewing serial enzymes, Dr. Johnsie Cancel felt the the patient was having and acute MI.  She was taken emergently to the cath lab by Dr. Christy Sartorius.  Catheterization results:  1. Ejection fraction 35%.  2. Akinesis of the mid to distal anterior wall and apex.  3. Mild aneurysmal enlargement.  4. No mitral regurgitation.  5. Left main ostial tapering about 45%.  6. Left anterior descending:  Two diagonals 100% mid, 100% D2.  7. Left circumflex: Two small OM proximal, OM-3 mid 50%.  8. Right coronary artery dominant, PDA, and 2PV, mid 30%.  9. Recheck left ventricular function and perfusion in four to six months.     Dr. Lyndel Safe placed the patient on aspirin, Plavix, heparin, ReoPro.  That evening the patient had a nine-beat run of nonsustained ventricular tachycardia.  She was asymptomatic.  Dr. Lyndel Safe felt because of the catheterization findings that the patient was at risk for a left ventricular thrombus.  She was consented to the [place]  study to prevent left ventricular thrombus, and she was started on Lovenox.  On December 08, 2000, the patient underwent 2-D echocardiogram.  Findings:  1. Apical akinesis.  2. No mural thrombus.  3. Ejection fraction 30-35%.  4. Severe hypokinesis of anterior wall.  5. Severe hypokinesis of septal wall.  6. Severe hypokinesis of periapical wall.  7. Mild mitral regurgitation.  8. Mild dilation of the left atrium.  9. Mild to moderate tricuspid regurgitation.  On December 09, 2000, the patient was again seen by Dr. Johnsie Cancel.  He felt that the patient was stable enough to go home.  DISCHARGE MEDICATIONS:  1. Coated aspirin 81 mg q.d.  2. Lanoxin 0.125 mg every other day  3. Paxil 20 mg q.d.  4. Lipitor 10 mg q.d.  5. Prempro 0.625/2.5 mg q.d.  6. Prevacid 30 mg q.d.  7. Altace 5 mg q.d.  8. Lasix 20 mg q.d.  9. Toprol XL 50 mg q.d. 10. Lovenox as directed by research. 11. Tussionex one teaspoon q.12h. p.r.n. 12. Z-Pak as directed.  DISCHARGE INSTRUCTIONS:  1. The patient was advised to avoid driving, heavy lifting, sexual activity     or tub baths for 24 hours.  2. The patient was advised she may return to work on December 27, 2000.  3. She was advised to follow  a low-fat, low-cholesterol diet.  4. She was advised to watch the catheterization site for bleeding, pain,     swelling.  To call the [place] office for any of these problems.  5. She was advised to stop smoking.  DISCHARGE FOLLOWUP:  She was advised to call the [place] office on Friday, January 4 for a follow-up appointment with the P.A. in two weeks and Dr. Johnsie Cancel in six to eight weeks.  LABORATORY AND X-RAY DATA:  White count 7.1, hemoglobin 10.7, hematocrit 31.7, and platelets of 194.  ABGs:  A pH of 7.437, pCO2 43.4, bicarb 29.0, pCO2 of 31.0, base excess 5.0.  Sodium 137, potassium 4.6, chloride 104, glucose 122, BUN 10, creatinine 1.2.  Peak CK of 1535, peak CK-MB 342.5, peak troponin I of 10.69.  EKG revealed  normal sinus rhythm with a low voltage QRS, left anterior fascicular block, inferior and anterolateral infarction, prolonged QT. DD:  12/09/00 TD:  12/09/00 Job: [id number]

## 2011-04-24 NOTE — Cardiovascular Report (Signed)
[name], [name]                 ACCOUNT NO.:  [id number]   MEDICAL RECORD NO.:  [id number]          PATIENT TYPE:  OIB   LOCATION:  1961                         FACILITY:  [place]   PHYSICIAN:  Peter C. Johnsie Cancel, MD, FACCDATE OF BIRTH:  09-29-1951   DATE OF PROCEDURE:  11/04/2006  DATE OF DISCHARGE:                            CARDIAC CATHETERIZATION   [name] is a 60 year old patient who has had a previous anterior wall  MI with angioplasty of the LAD.  She has also had angioplasty of the  right coronary artery.   She has been having recurrent chest pain.  The study was done to rule  out progressive disease.   Cine catheterization was done with 4-French catheters from right femoral  artery.   Left main coronary artery was a short segment and was normal.   The left anterior descending artery was calcified.  The proximal portion  had 20% discrete stenosis.  The mid vessel was normal.  The distal  vessel after the takeoff of a rather large second diagonal branch was  somewhat small.  At the takeoff of the diagonal branch, there was a 40%  residual lesion.   There were no obvious stent struts seen fluoroscopically.   Circumflex coronary artery was nondominant.  There was a 30% eccentric  lesion proximally.   There was one large obtuse marginal branch which was normal.   The right coronary artery was dominant.   The proximal segment was normal.  The mid segment had 20 to 30% multiple  discrete lesions.   The patient had a PDA and 2 posterior lateral branches.  The distal  posterior lateral branch had 20 to 30% discrete disease.   RAO ventriculography:  RAO ventriculography showed significant anterior  apical wall hypokinesis.  The EF was in the 35% range.   There was no significant MR.  LV pressure was 145/61.  Aortic pressure  is 140/19.   IMPRESSION:  Patient has patent arteries.  However, her LV function is  down.  She will be referred for cardiac MRI and probably to  determine  trial to assess scar burden and risk for VT.  Her chest pain would  appear to be noncardiac at this time.      [name]. Johnsie Cancel, MD, [facility]  Electronically Signed     PCN/MEDQ  D:  11/04/2006  T:  11/04/2006  Job:  [id number]

## 2011-04-24 NOTE — Discharge Summary (Signed)
[place]. [facility]  Patient:    [name], [name] Visit Number: [id number] MRN: [id number]          Service Type: MED Location: [phone] Attending Physician:  Nikki Dom Dictated by:   Mannie Stabile, P.A. Admit Date:  03/01/2002 Discharge Date: 03/04/2002   CC:         Youlanda Roys. Deatra Ina, M.D.   Referring Physician Discharge [place]  PROCEDURES: 1. Coronary angiogram, March 02, 2002. 2. Carotid Dopplers.  REASON FOR ADMISSION:  Please refer to dictated admission noted.  LABORATORY DATA:  WBC 11.6, hemoglobin 12.7, hematocrit 35.8, platelets 243 on admission.  Sodium 137, potassium 3.8, glucose 118, BUN 8, creatinine 0.8 on admission.  Cardiac enzymes:  Peak CPK 347/53, peak troponin I 5.39. Lipid profile:  Total cholesterol 173, triglycerides 181, HDL 52, LDL 85.  Admission CXR:  Mild cardiomegaly, COPD.  HOSPITAL COURSE:  The patient presented with symptoms felt typical for ischemic heart disease, and cardiac enzymes were abnormal and consistent with non-Q-wave myocardial infarction.  The patient was stabilized on aspirin, beta blocker, and heparin and referred for coronary angiography.  Cardiac catheterization, performed by Dr. Terald Sleeper (see report for full details), revealed total occlusion of the distal PLA with otherwise nonobstructive coronary artery disease and moderate LV dysfunction (EF 40%) with 1+ mitral regurgitation.  Dr. Dannielle Burn recommended aggressive risk factor modification and the addition of Foltx and Plavix, the latter for nine months.  Recommendations also to proceed with an outpatient adenosine Cardiolite in 4-6 weeks.  The patient will also need outpatient lower extremity ABI, left.  Of note, the patient also underwent carotid duplex scanning for evaluation of bilateral bruits.  The study indicated 40-60% RICA with no significant LICA disease and moderate left ECA disease.  DISCHARGE MEDICATIONS:  1. Plavix 75  mg q.d. (x 9 months).  2. Foltx 1 tablet q.d.  3. Beconase nasal spray as directed.  4. Aspirin 81 mg q.d.  5. Lipitor 20 mg q.d.  6. Altace 2.5 mg b.i.d.  7. Lasix 20 mg q.d.  8. Protonix 40 mg b.i.d.  9. Paxil 20 mg q.d. 10. Toprol XL 50 mg q.d.  INSTRUCTIONS: 1. No heavy lifting, driving, or strenuous activity x 2 days. 2. Low fat/cholesterol diet. 3. Stop smoking tobacco.  The patient is instructed to call and schedule a follow-up appointment with Dr. Johnsie Cancel in two weeks.  Arrangements will be made for adenosine Cardiolite testing in 4-6 weeks.  She will also need to be scheduled for ABIs.  DISCHARGE DIAGNOSES: 1. Coronary artery disease.    a. Non-Q-wave myocardial infarction secondary to 100% posterolateral       coronary artery - treated medically.    b. Plavix x 9 months.    c. Moderate left ventricular dysfunction (EF 40%). 2. Peripheral vascular disease.    a. 40-60% right internal carotid artery. 3. Dyslipidemia. 4. Hypertension. 5. Chronic obstructive pulmonary disease/tobacco. 6. History of gastroesophageal reflux disease. Dictated by:   Mannie Stabile, P.A. Attending Physician:  Nikki Dom DD:  04/12/02 TD:  04/12/02 Job: [id number]

## 2011-04-24 NOTE — Cardiovascular Report (Signed)
[place]. [facility]  Patient:    [name], [name]                        MRN: [id number] Proc. Date: 12/06/00 Adm. Date:  [id number] Attending:  Cristopher Peru CC:         Grafton Folk, [place]. Johnsie Cancel, M.D. [facility]   Cardiac Catheterization  PROCEDURES PERFORMED 1. Left heart catheterization. 2. Left ventriculogram. 3. Selective coronary angiography. 4. Percutaneous transluminal coronary angioplasty of the mid and distal left    anterior descending. 5. Percutaneous transluminal coronary angioplasty of the second diagonal    branch. 6. Anterior jet of the left anterior descending. 7. Intravascular ultrasound of the left anterior descending and left main    trunk.  DIAGNOSES 1. Single-vessel coronary artery disease with acute anterior wall myocardial    infarction. 2. Moderate left ventricular dysfunction.  HISTORY:  [name] is a 60 year old white female with known history of LV dysfunction and congestive heart failure, who presents with substernal chest discomfort with shortness of breath and diaphoresis.  Patient had radiation of pain to both arms.  She had nonspecific ECG changes on presentation, which on followup suggested evolving anterior and anterolateral myocardial infarction; in addition, the patient had elevated cardiac enzymes.  She was brought to the catheterization lab urgently for left heart catheterization.  TECHNIQUE:  Informed consent was obtained from the patient.  She was brought to the catheterization lab.  A 6-French sheath was placed in the right femoral artery and left heart catheterization performed in the usual fashion using preformed 6-French Judkins catheters.  Initial findings are as follows:  1. Left main trunk:  There is an ostial taper of approximately 40%.  Damping    of wave form was noted with engagement of the [place] catheter. 2. LAD:  This begins as a large-caliber vessel and tapers  significantly in the    midsection.  The LAD is occluded in the midsection near the takeoff of the    second septal perforator.  The first diagonal branch is seen and has only    mild irregularities.  The proximal and mid-LAD has mild irregularities.    The distal LAD will later be seen to a narrowing of 70% after the second    diagonal branch.  The second diagonal branch is occluded in its proximal    segment. 3. Left circumflex artery:  This is a medium-caliber vessel that provided two    small marginal branches in the proximal segment and a bifurcating third    marginal branch in the midsection.  The A-V circumflex has a narrowing of    50% after the second marginal branch.  There is a further narrowing of 30%    in the proximal segment of the inferior division of the bifurcating third    marginal branch. 4. Right coronary artery is dominant.  This is a large-caliber vessel that    provides the posterior descending artery and two posterior ventricular    branches in the terminal segment.  There are irregularities of 30% in the    midsection of the right coronary artery. 5. LV:  Mildly dilated in systolic and end-diastolic dimensions.  Overall left    ventricular function is moderately impaired, ejection fraction    approximately 35%.  There is akinesis of the mid and distal anterior wall    as well as apex, with mild aneurysmal enlargement.  No mitral  regurgitation    is noted.  LV pressure is 142/15.  Aortic was 142/80.  LVEDP equals 25.  With these findings, we elected to proceed with urgent revascularization of the LAD.  The 6-French sheath in the right femoral artery was exchanged over a wire for a 7-French sheath and a 6-French venous sheath placed in the right femoral vein.  The patient was given heparin and ReoPro on a weight-adjusted basis and Plavix 300 mg orally.  A 0.014 Patriot wire was then introduced within a 3.5 Q guide catheter with side-holes.  This was used to probe  the lesion, with successful advancing into the distal LAD.  Repeat angiography showed sluggish flow into the distal LAD with evidence of stenosis and thrombus within the mid LAD at the site of occlusion.  The Possis AngioJet system was introduced and two passes made within the mid and distal LAD at the site of occlusion at 18 seconds each.  Repeat angiography showed improvement in vessel lumen; however, there was significant residual disease in the mid and distal LAD.  A 2.5 x 20.0-mm OpenSail balloon was introduced and a total of four inflations were performed within the mid and distal LAD at the site of occlusion as well as a further narrowing of 70% in the distal section at up to 8 atmospheres for 60 seconds.  Repeat angiography showed significant improvement in vessel lumen and flow.  There was evidence of a small thrombus that had migrated into the distal section of the vessel.  The origin of the second diagonal branch was now visible.  A short Hi-Torque floppy wire was introduced and used to cross the second diagonal branch.  A 2.5 x 10.0-mm CrossSail balloon was then introduced and an inflation performed at 8 atmospheres for 60 seconds in the proximal segment of the vessel.  Repeat angiography showed some mild vessel disruption, although flow was not that compromised and was in fact significantly improved.  A repeat inflation was performed at 4 atmospheres for three minutes; however, there was persistence of this defect.  This appeared to represent residual thrombus and not necessarily dissection.  Further assessment of the LAD was then performed as well as the left main trunk with intravascular ultrasound.  The floppy wire in the second diagonal branch was removed.  Intravascular ultrasound showed a small caliber of the distal LAD, approximately 2 mm in diameter.  There was evidence of an eccentric plaque in the midsection at the takeoff of the second diagonal branch which appeared  to compromise the lumen by approximately 20-30%.  There was further mild eccentric plaque in the proximal and  midsection of the LAD that was noncritical.  At the site of occlusion, there was no significant disease.  We initially prepared for stenting of the mid-LAD at the site of occlusion; however, with these findings, we elected not to. The left main trunk was a large-caliber vessel in its distal segment with a lumen of 4 x 4 mm.  The ostial segment was 2 x 4 mm.  There was significant eccentric plaque with calcium in the left main trunk.  Final angiography was then performed after the administration of intracoronary nitroglycerin and verapamil, showing an excellent result with less than 20% residual narrowing in the mid and distal LAD as well as the first diagonal branch.  There was TIMI-3 flow through the LAD and second diagonal branch.  This was deemed an acceptable result.  The guide catheter was then removed and the sheath secured into  position.  The patient tolerated the procedure well with resolution of pain.  She was then transferred to the floor in stable condition.  HEMODYNAMIC DATA:  LV was 142/15 and aortic was 142/80.  LVEDP equals 25.  FINAL RESULT 1. Successful percutaneous transluminal coronary angioplasty of the mid left    anterior descending, with the reduction of 100% narrowing to less than 20%. 2. Successful percutaneous transluminal coronary angioplasty of the distal    left anterior descending, with reduction of 70% narrowing to less than 20%. 3. Successful percutaneous transluminal coronary angioplasty of the second    diagonal branch, with reduction of 100% narrowing to less than 20%,    improvement of flow through the left anterior descending and second    diagonal branch from TIMI grade 0 to TIMI grade 3.  ASSESSMENT AND PLAN:  [name] is a 60 year old white female who suffered an anterior and anterolateral wall myocardial infarction with significant  left ventricular dysfunction.  She has at least moderate residual disease in the left main trunk that should be followed closely.  Reassessment of her left ventricular function and coronary perfusion may be made in four to six months time to determine if further revascularization is warranted. DD:  12/06/00 TD:  12/06/00 Job: [id number]

## 2011-04-24 NOTE — Cardiovascular Report (Signed)
[place]. [facility]  Patient:    TAILOR, STRATMANN Visit Number: [id number] MRN: [id number]          Service Type: MED Location: [phone] Attending Physician:  Nikki Dom Dictated by:   Terald Sleeper, M.D. [facility] Proc. Date: 03/02/02 Admit Date:  03/01/2002   CC:         Dr. Dallas Breeding C. Johnsie Cancel, M.D. [facility]   Cardiac Catheterization  DATE OF BIRTH: Jan 14, 1951  REFERRING PHYSICIAN: Dr. Deatra Ina  CARDIOLOGIST: [name]. Johnsie Cancel, M.D.  PROCEDURES PERFORMED: 1. Left heart catheterization with selective coronary angiography. 2. Ventriculography.  DIAGNOSES: 1. Occluded posterolateral branch (infarction-related artery, status post    non-Q-wave myocardial infarction). 2. Nonobstructive coronary artery disease in the distribution of the    left anterior descending and circumflex coronary artery. 3. Mild to moderate left ventricular systolic dysfunction.  INDICATIONS: The patient is a 60 year old white female with a history of coronary artery disease and continued tobacco use. The patient presented approximately one-year ago with anterior wall myocardial infarction requiring AngioJet therapy and percutaneous coronary intervention to the LAD and diagonal. She now presents with recurrent substernal chest pain and has ruled in for a non-Q-wave myocardial infarction. The patient had been referred for diagnostic catheterization to assess her coronary anatomy.  DESCRIPTION OF PROCEDURE: After informed consent was obtained, the patient was brought to the catheterization laboratory. Her right groin was sterilely prepped and draped.  Lidocaine 1% was injected. A 6 French arterial sheath was placed using the modified Seldinger technique.  Subsequently, a 6 [place] JL 3.5 and JR4 catheters were used to engage the ostia of the coronary arteries. Coronary angiography was then performed in various projections using manual injection of contrast. Following  coronary angiography, a 6 French pigtail catheter was placed in the left ventricular cavity and appropriate left-sided hemodynamics were obtained. Ventriculography was then performed in a single plan RAO projection using power injection of contrast. At the termination of the procedure, all catheters and sheath were removed and the patient was brought back to the holding area. No complications were encountered and adequate hemostasis was provided.  FINDINGS:  HEMODYNAMICS: Left ventricular pressure 133/16 mmHg, aortic pressure 133/62 mmHg. There was no gradient on aortic pullback.  VENTRICULOGRAPHY: Ejection fraction approximately 40% as there were multiple segmental wall motion abnormalities. There appeared to be hypokinesis of the anterior wall extending from the mid ventricle to the apex. There was a smaller area of hypokinesis in the mid inferior wall. Mitral regurgitation of 1+ was noted.  SELECTIVE CORONARY ANGIOGRAPHY: 1. Left main coronary is a large caliber vessel with no evidence of    flow-limiting coronary artery disease. 2. The LAD is a moderate to large caliber vessel giving rise to two    moderate sized diagonal branches. There is approximately 50% re-stenosis    at the prior site of the percutaneous coronary interventions following the    second diagonal branch. The remainder of the LAD distribution is free    of flow-limiting coronary artery disease. 3. Circumflex coronary is a large caliber vessel with diffuse 30%    stenosis in the proximal vessel and the first obtuse marginal branch    that is approximately 50% stenosed at the ostia. The circumflex proper,    otherwise is free of flow-limiting coronary artery disease. 4. Right coronary artery is a large caliber vessel terminating in three    posterolateral branches and a posterior descending artery. The mid  RCA has a diffuse 20-30%. There is a cutoff site in the second    posterolateral branch with some dye  hang-up consistent with a an acute    occlusion of the second posterolateral branch. This appeared to be the    infarct-related artery.  RECOMMENDATIONS: The angiographic images were reviewed with Dr. Vicenta Aly. The plan is to continue medical therapy, particularly in light of a small occlusion of the posterolateral branch. I have counseled the patient regarding tobacco use. I have also added Plavix to her medical regimen and decreased her aspirin to 81 mg a day. The patient also will be placed on Foltx. A lipid panel needs to be monitored closely and the patient could be a candidate for ______ for secondary prevention.  Followup is required and a Cardiolite study in four to six has been scheduled. ictated by:   Terald Sleeper, M.D. [place] Attending Physician:  Nikki Dom DD:  03/02/02 TD:  03/03/02 Job: [id number]

## 2011-05-07 NOTE — Telephone Encounter (Signed)
When papers come will fax to [facility]

## 2011-06-03 ENCOUNTER — Other Ambulatory Visit: Payer: Self-pay | Admitting: *Deleted

## 2011-06-03 DIAGNOSIS — I2589 Other forms of chronic ischemic heart disease: Secondary | ICD-10-CM

## 2011-06-03 DIAGNOSIS — I5023 Acute on chronic systolic (congestive) heart failure: Secondary | ICD-10-CM

## 2011-06-03 DIAGNOSIS — I509 Heart failure, unspecified: Secondary | ICD-10-CM

## 2011-06-03 DIAGNOSIS — F17200 Nicotine dependence, unspecified, uncomplicated: Secondary | ICD-10-CM

## 2011-06-03 DIAGNOSIS — I10 Essential (primary) hypertension: Secondary | ICD-10-CM

## 2011-06-03 DIAGNOSIS — I428 Other cardiomyopathies: Secondary | ICD-10-CM

## 2011-06-03 MED ORDER — METOPROLOL SUCCINATE ER 50 MG PO TB24: 50.0000 mg | ORAL_TABLET | Freq: Every day | ORAL | Status: DC

## 2011-06-04 ENCOUNTER — Other Ambulatory Visit: Payer: Self-pay | Admitting: *Deleted

## 2011-06-04 DIAGNOSIS — I2589 Other forms of chronic ischemic heart disease: Secondary | ICD-10-CM

## 2011-06-04 DIAGNOSIS — I509 Heart failure, unspecified: Secondary | ICD-10-CM

## 2011-06-04 DIAGNOSIS — I428 Other cardiomyopathies: Secondary | ICD-10-CM

## 2011-06-04 DIAGNOSIS — I10 Essential (primary) hypertension: Secondary | ICD-10-CM

## 2011-06-04 DIAGNOSIS — I5023 Acute on chronic systolic (congestive) heart failure: Secondary | ICD-10-CM

## 2011-06-04 DIAGNOSIS — F17200 Nicotine dependence, unspecified, uncomplicated: Secondary | ICD-10-CM

## 2011-06-04 MED ORDER — METOPROLOL SUCCINATE ER 50 MG PO TB24: 50.0000 mg | ORAL_TABLET | Freq: Every day | ORAL | Status: DC

## 2011-06-08 ENCOUNTER — Other Ambulatory Visit: Payer: Self-pay | Admitting: *Deleted

## 2011-06-08 DIAGNOSIS — F17200 Nicotine dependence, unspecified, uncomplicated: Secondary | ICD-10-CM

## 2011-06-08 DIAGNOSIS — I10 Essential (primary) hypertension: Secondary | ICD-10-CM

## 2011-06-08 DIAGNOSIS — I2589 Other forms of chronic ischemic heart disease: Secondary | ICD-10-CM

## 2011-06-08 DIAGNOSIS — I5023 Acute on chronic systolic (congestive) heart failure: Secondary | ICD-10-CM

## 2011-06-08 DIAGNOSIS — I509 Heart failure, unspecified: Secondary | ICD-10-CM

## 2011-06-08 DIAGNOSIS — I428 Other cardiomyopathies: Secondary | ICD-10-CM

## 2011-06-08 MED ORDER — ATORVASTATIN CALCIUM 40 MG PO TABS: 40.0000 mg | ORAL_TABLET | Freq: Every day | ORAL | Status: DC

## 2011-07-02 ENCOUNTER — Encounter: Payer: Managed Care, Other (non HMO) | Admitting: *Deleted

## 2011-07-07 ENCOUNTER — Encounter: Payer: Self-pay | Admitting: *Deleted

## 2011-07-13 ENCOUNTER — Encounter: Payer: Self-pay | Admitting: Internal Medicine

## 2011-07-13 ENCOUNTER — Ambulatory Visit (INDEPENDENT_AMBULATORY_CARE_PROVIDER_SITE_OTHER): Payer: Managed Care, Other (non HMO) | Admitting: *Deleted

## 2011-07-13 DIAGNOSIS — I428 Other cardiomyopathies: Secondary | ICD-10-CM

## 2011-07-13 DIAGNOSIS — I5023 Acute on chronic systolic (congestive) heart failure: Secondary | ICD-10-CM

## 2011-07-15 NOTE — Progress Notes (Signed)
icd remote check w/icm

## 2011-07-22 ENCOUNTER — Encounter: Payer: Self-pay | Admitting: Cardiovascular Disease

## 2011-07-24 ENCOUNTER — Other Ambulatory Visit: Payer: Self-pay | Admitting: Family Medicine

## 2011-07-24 ENCOUNTER — Ambulatory Visit
Admission: RE | Admit: 2011-07-24 | Discharge: 2011-07-24 | Disposition: A | Discharge status: Home / Self Care | Payer: Managed Care, Other (non HMO) | Source: Ambulatory Visit | Attending: Family Medicine | Admitting: Family Medicine

## 2011-07-24 DIAGNOSIS — J329 Chronic sinusitis, unspecified: Secondary | ICD-10-CM

## 2011-07-28 ENCOUNTER — Encounter: Payer: Self-pay | Admitting: *Deleted

## 2011-08-14 ENCOUNTER — Ambulatory Visit: Payer: Managed Care, Other (non HMO) | Admitting: Cardiovascular Disease

## 2011-08-14 ENCOUNTER — Encounter: Payer: Self-pay | Admitting: Cardiovascular Disease

## 2011-08-14 ENCOUNTER — Ambulatory Visit (INDEPENDENT_AMBULATORY_CARE_PROVIDER_SITE_OTHER)
Admission: RE | Admit: 2011-08-14 | Discharge: 2011-08-14 | Disposition: A | Discharge status: Home / Self Care | Payer: Managed Care, Other (non HMO) | Source: Ambulatory Visit | Attending: Cardiovascular Disease | Admitting: Cardiovascular Disease

## 2011-08-14 ENCOUNTER — Ambulatory Visit (INDEPENDENT_AMBULATORY_CARE_PROVIDER_SITE_OTHER): Payer: Managed Care, Other (non HMO) | Admitting: Cardiovascular Disease

## 2011-08-14 ENCOUNTER — Telehealth: Payer: Self-pay | Admitting: Physician Assistant

## 2011-08-14 VITALS — BP 136/85 | HR 91 | Resp 14 | Ht 63.0 in | Wt 135.0 lb

## 2011-08-14 DIAGNOSIS — I509 Heart failure, unspecified: Secondary | ICD-10-CM | POA: Insufficient documentation

## 2011-08-14 DIAGNOSIS — I2510 Atherosclerotic heart disease of native coronary artery without angina pectoris: Secondary | ICD-10-CM

## 2011-08-14 DIAGNOSIS — I10 Essential (primary) hypertension: Secondary | ICD-10-CM

## 2011-08-14 LAB — BRAIN NATRIURETIC PEPTIDE: Brain Natriuretic Peptide: 1098.6 pg/mL — ABNORMAL HIGH (ref 0.0–100.0)

## 2011-08-14 LAB — BASIC METABOLIC PANEL WITH GFR
BUN: 12 mg/dL (ref 6–23)
Creat: 0.82 mg/dL (ref 0.50–1.10)
GFR, Est African American: >60 mL/min (ref >60)
GFR, Est Non African American: >60 mL/min (ref >60)

## 2011-08-14 MED ORDER — METOLAZONE 2.5 MG PO TABS: ORAL_TABLET | ORAL | Status: DC

## 2011-08-14 NOTE — Assessment & Plan Note (Signed)
Add zaroxyln  BMET and BNP with CXR today.  F/U in 2-3 weeks  Not clear what component of dyspnea is cardiac vs pulmonary related

## 2011-08-14 NOTE — Telephone Encounter (Signed)
Rec'd a call that her BNP was >1000. Other labs OK. Called pt and she is doing OK. She filled Rx for metolazone and will take it as directed. Advised her to call 911 and come to ER if Sx worsen.

## 2011-08-14 NOTE — Patient Instructions (Signed)
Your physician recommends that you schedule a follow-up appointment in: 2-3 WEEKS  A chest x-ray takes a picture of the organs and structures inside the chest, including the heart, lungs, and blood vessels. This test can show several things, including, whether the heart is enlarges; whether fluid is building up in the lungs; and whether pacemaker / defibrillator leads are still in place. AT [place]  Your physician recommends that you return for lab work in: TODAY  START ZAROXOLYN 2.5 MG 30 [place]

## 2011-08-14 NOTE — Progress Notes (Signed)
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 EF by MRI 24% with significant apical and inferior scar. Prophylactic AICD placed by Dr Rayann Heman. 2/10. Normal device function on routine device clinic F/U. She is still depressed Paxil helps some . Mother died in 08-21-10. Still smoking. Has program at work Failed hypnosis and gum. Told her we would start Zyban and patches. Link to vascular disease discussed. No SSCP, dyspnea, palpitaitons or device D/C  She has been Rx with antibiotics by primary for URI and sinuses  Feels she is retaining fluid and weight up .  Not clear if dyspnea is related to COPD or CHF.     ROS: Denies fever, malais, weight loss, blurry vision, decreased visual acuity, cough, sputum, SOB, hemoptysis, pleuritic pain, palpitaitons, heartburn, abdominal pain, melena, lower extremity edema, claudication, or rash.  All other systems reviewed and negative  General: Affect appropriate Healthy:  appears stated age 60: normal Neck supple with no adenopathy JVP normal no bruits no thyromegaly Lungs bialteral inspitory crackles  with no wheezing and good diaphragmatic motion Heart:  S1/S2 no murmur,rub, gallop or click PMI normal Abdomen: benighn, BS positve, no tenderness, no AAA no bruit.  No HSM or HJR Distal pulses intact with no bruits Plus one bilateral  edema Neuro non-focal Skin warm and dry No muscular weakness   Current Outpatient Prescriptions  Medication Sig Dispense Refill  . aspirin 81 MG tablet Take 81 mg by mouth daily.        Marland Kitchen atorvastatin (LIPITOR) 40 MG tablet Take 1 tablet (40 mg total) by mouth daily.  90 tablet  3  . Budesonide (PULMICORT IN) Inhale into the lungs as directed.        . Cyanocobalamin (VITAMIN B-12 CR PO) Take by mouth as directed.        . fexofenadine (ALLEGRA) 180 MG tablet Take 180 mg by mouth daily.        . fish oil-omega-3 fatty acids 1000 MG capsule Take 2 g by mouth daily.        . furosemide (LASIX) 20 MG  tablet Take 1 tablet (20 mg total) by mouth daily.  90 tablet  1  . metoprolol (TOPROL-XL) 50 MG 24 hr tablet Take 1 tablet (50 mg total) by mouth daily.  90 tablet  3  . omeprazole (PRILOSEC) 10 MG capsule Take 10 mg by mouth daily.        Marland Kitchen PARoxetine (PAXIL) 30 MG tablet Take 30 mg by mouth every morning.        . ramipril (ALTACE) 10 MG capsule Take 10 mg by mouth daily.        . vitamin C (ASCORBIC ACID) 500 MG tablet Take 500 mg by mouth daily.          Allergies  Review of patient's allergies indicates no known allergies.  Electrocardiogram:  Assessment and Plan

## 2011-08-14 NOTE — Assessment & Plan Note (Signed)
Well controlled.  Continue current medications and low sodium Dash type diet.    

## 2011-08-14 NOTE — Assessment & Plan Note (Signed)
Stable with no angina and good activity level.  Continue medical Rx  

## 2011-08-19 ENCOUNTER — Telehealth: Payer: Self-pay | Admitting: Cardiovascular Disease

## 2011-08-19 NOTE — Telephone Encounter (Signed)
Spoke with pt, she is aware of lab results. Her weight is down and her swelling is better. She will weigh and if she is back down close to her dry weight she will decrease the zaroxolyn to every other day. Fredia Beets

## 2011-08-19 NOTE — Telephone Encounter (Signed)
Pt rtn call 

## 2011-08-31 ENCOUNTER — Ambulatory Visit: Payer: Managed Care, Other (non HMO) | Admitting: Cardiovascular Disease

## 2011-09-02 ENCOUNTER — Other Ambulatory Visit: Payer: Self-pay | Admitting: *Deleted

## 2011-09-02 DIAGNOSIS — F17200 Nicotine dependence, unspecified, uncomplicated: Secondary | ICD-10-CM

## 2011-09-02 DIAGNOSIS — I2589 Other forms of chronic ischemic heart disease: Secondary | ICD-10-CM

## 2011-09-02 DIAGNOSIS — I10 Essential (primary) hypertension: Secondary | ICD-10-CM

## 2011-09-02 DIAGNOSIS — I5023 Acute on chronic systolic (congestive) heart failure: Secondary | ICD-10-CM

## 2011-09-02 DIAGNOSIS — I428 Other cardiomyopathies: Secondary | ICD-10-CM

## 2011-09-02 DIAGNOSIS — I509 Heart failure, unspecified: Secondary | ICD-10-CM

## 2011-09-02 MED ORDER — RAMIPRIL 10 MG PO CAPS: 10.0000 mg | ORAL_CAPSULE | Freq: Every day | ORAL | Status: DC

## 2011-09-09 ENCOUNTER — Other Ambulatory Visit: Payer: Self-pay | Admitting: *Deleted

## 2011-09-09 MED ORDER — FUROSEMIDE 20 MG PO TABS: 20.0000 mg | ORAL_TABLET | Freq: Every day | ORAL | Status: DC

## 2011-09-17 ENCOUNTER — Ambulatory Visit (INDEPENDENT_AMBULATORY_CARE_PROVIDER_SITE_OTHER)
Admission: RE | Admit: 2011-09-17 | Discharge: 2011-09-17 | Disposition: A | Discharge status: Home / Self Care | Payer: Managed Care, Other (non HMO) | Source: Ambulatory Visit | Attending: Cardiovascular Disease | Admitting: Cardiovascular Disease

## 2011-09-17 ENCOUNTER — Ambulatory Visit (INDEPENDENT_AMBULATORY_CARE_PROVIDER_SITE_OTHER): Payer: Managed Care, Other (non HMO) | Admitting: Cardiovascular Disease

## 2011-09-17 ENCOUNTER — Encounter: Payer: Self-pay | Admitting: *Deleted

## 2011-09-17 ENCOUNTER — Encounter: Payer: Self-pay | Admitting: Cardiovascular Disease

## 2011-09-17 DIAGNOSIS — I428 Other cardiomyopathies: Secondary | ICD-10-CM

## 2011-09-17 DIAGNOSIS — Z95810 Presence of automatic (implantable) cardiac defibrillator: Secondary | ICD-10-CM | POA: Insufficient documentation

## 2011-09-17 DIAGNOSIS — I2510 Atherosclerotic heart disease of native coronary artery without angina pectoris: Secondary | ICD-10-CM

## 2011-09-17 DIAGNOSIS — F17200 Nicotine dependence, unspecified, uncomplicated: Secondary | ICD-10-CM

## 2011-09-17 DIAGNOSIS — I509 Heart failure, unspecified: Secondary | ICD-10-CM

## 2011-09-17 DIAGNOSIS — Z01810 Encounter for preprocedural cardiovascular examination: Secondary | ICD-10-CM

## 2011-09-17 DIAGNOSIS — I2589 Other forms of chronic ischemic heart disease: Secondary | ICD-10-CM

## 2011-09-17 DIAGNOSIS — I10 Essential (primary) hypertension: Secondary | ICD-10-CM

## 2011-09-17 DIAGNOSIS — I5023 Acute on chronic systolic (congestive) heart failure: Secondary | ICD-10-CM

## 2011-09-17 LAB — CBC WITH DIFFERENTIAL/PLATELET
Basophils Absolute: 0 10*3/uL (ref 0.0–0.1)
Basophils Relative: 0.3 % (ref 0.0–3.0)
Eosinophils Absolute: 0.1 10*3/uL (ref 0.0–0.7)
MCHC: 33.9 g/dL (ref 30.0–36.0)
MCV: 94.3 fl (ref 78.0–100.0)
Monocytes Absolute: 0.7 10*3/uL (ref 0.1–1.0)
Neutrophils Relative %: 64.6 % (ref 43.0–77.0)
Platelets: 209 10*3/uL (ref 150.0–400.0)
RDW: 15.3 % — ABNORMAL HIGH (ref 11.5–14.6)

## 2011-09-17 LAB — BASIC METABOLIC PANEL
BUN: 14 mg/dL (ref 6–23)
CO2: 31 mEq/L (ref 19–32)
Calcium: 9.3 mg/dL (ref 8.4–10.5)
Chloride: 92 mEq/L — ABNORMAL LOW (ref 96–112)
Creatinine, Ser: 0.9 mg/dL (ref 0.4–1.2)
Glucose, Bld: 121 mg/dL — ABNORMAL HIGH (ref 70–99)

## 2011-09-17 LAB — PROTIME-INR
INR: 0.9 ratio (ref 0.8–1.0)
Prothrombin Time: 10.4 s (ref 10.2–12.4)

## 2011-09-17 MED ORDER — RAMIPRIL 10 MG PO CAPS
10.0000 mg | ORAL_CAPSULE | Freq: Every day | ORAL | Status: DC
Start: 2011-09-17 — End: 2011-09-17

## 2011-09-17 MED ORDER — RAMIPRIL 10 MG PO CAPS: 10.0000 mg | ORAL_CAPSULE | Freq: Every day | ORAL | Status: DC

## 2011-09-17 NOTE — Assessment & Plan Note (Signed)
CHF improved with addition of zaroxyln.  Check labs.  Right heart cath with cors

## 2011-09-17 NOTE — Progress Notes (Signed)
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 EF by MRI 24% with significant apical and inferior scar. Prophylactic AICD placed by Dr Rayann Heman. 2/10. Normal device function on routine device clinic F/U. She is still depressed Paxil helps some . Mother died in 08-28-10. Still smoking. Has program at work Failed hypnosis and gum. Told her we would start Zyban and patches. Link to vascular disease discussed  Had CHF exacerbatin last  Few weeks.  Weight down 135 to 127 and has had a good response to zarozyln.  She is having angina.  In am and with stress and exertion.  No prolonged episodes Encouraged her to use nitro if needed  Last cath 2007 with patent cors.  Previous PCI of LAD and RCA.    With angina and CHF exacerbation right and left cath indicated.  Risks discussed and willing to proceed.  She stopped lipitor due to general malaise and memory problems  She feels better off it and will avoid statins for now  ROS: Denies fever, malais, weight loss, blurry vision, decreased visual acuity, cough, sputum, , hemoptysis, pleuritic pain, palpitaitons, heartburn, abdominal pain, melena, lower extremity edema, claudication, or rash.  All other systems reviewed and negative  General: Affect appropriate Healthy:  appears stated age 61: normal Neck supple with no adenopathy JVP normal no bruits no thyromegaly Lungs clear with no wheezing and good diaphragmatic motion Bronchitic cough Heart:  S1/S2 no murmur,rub, gallop or click PMI normal Abdomen: benighn, BS positve, no tenderness, no AAA no bruit.  No HSM or HJR Distal pulses intact with no bruits No edema Neuro non-focal Skin warm and dry No muscular weakness   Current Outpatient Prescriptions  Medication Sig Dispense Refill  . aspirin 81 MG tablet Take 81 mg by mouth daily.        . Budesonide (PULMICORT IN) Inhale into the lungs as directed.        . Cyanocobalamin (VITAMIN B-12 CR PO) Take by mouth as directed.          . fexofenadine (ALLEGRA) 180 MG tablet Take 180 mg by mouth daily.        . fish oil-omega-3 fatty acids 1000 MG capsule Take 2 g by mouth daily.        . furosemide (LASIX) 20 MG tablet Take 1 tablet (20 mg total) by mouth daily.  90 tablet  1  . metolazone (ZAROXOLYN) 2.5 MG tablet TAKE ONE TABLET 30 MIN PRIOR TO LASIX DOSAGE  30 tablet  11  . metoprolol (TOPROL-XL) 50 MG 24 hr tablet Take 1 tablet (50 mg total) by mouth daily.  90 tablet  3  . omeprazole (PRILOSEC) 10 MG capsule Take 10 mg by mouth daily.        Marland Kitchen PARoxetine (PAXIL) 30 MG tablet Take 30 mg by mouth every morning.        . ramipril (ALTACE) 10 MG capsule Take 1 capsule (10 mg total) by mouth daily.  90 capsule  3    Allergies  Review of patient's allergies indicates no known allergies.  Electrocardiogram:  Assessment and Plan

## 2011-09-17 NOTE — Assessment & Plan Note (Signed)
Well controlled.  Continue current medications and low sodium Dash type diet.    

## 2011-09-17 NOTE — Assessment & Plan Note (Signed)
Angina and known decreased LV function. Right and left cath next Friday.  Labs and CXR to be done

## 2011-09-17 NOTE — Assessment & Plan Note (Signed)
Stress at work keeping her from quitting Consider Chantix post cath

## 2011-09-17 NOTE — Patient Instructions (Signed)
Your physician recommends that you schedule a follow-up appointment in: [place] has requested that you have a cardiac catheterization. Cardiac catheterization is used to diagnose and/or treat various heart conditions. Doctors may recommend this procedure for a number of different reasons. The most common reason is to evaluate chest pain. Chest pain can be a symptom of coronary artery disease (CAD), and cardiac catheterization can show whether plaque is narrowing or blocking your heart's arteries. This procedure is also used to evaluate the valves, as well as measure the blood flow and oxygen levels in different parts of your heart. For further information please visit [email or web address]. Please follow instruction sheet, as given.   A chest x-ray takes a picture of the organs and structures inside the chest, including the heart, lungs, and blood vessels. This test can show several things, including, whether the heart is enlarges; whether fluid is building up in the lungs; and whether pacemaker / defibrillator leads are still in place. AT [place]  Your physician recommends that you return for lab work in: [place]

## 2011-09-17 NOTE — Assessment & Plan Note (Signed)
Medtronic Virtuoso single lead.  No CHF parameters.  No D/C F/U with EPS.  May need BiV upgrade in future

## 2011-09-23 ENCOUNTER — Telehealth: Payer: Self-pay | Admitting: Cardiovascular Disease

## 2011-09-23 DIAGNOSIS — I2589 Other forms of chronic ischemic heart disease: Secondary | ICD-10-CM

## 2011-09-23 DIAGNOSIS — I509 Heart failure, unspecified: Secondary | ICD-10-CM

## 2011-09-23 DIAGNOSIS — I10 Essential (primary) hypertension: Secondary | ICD-10-CM

## 2011-09-23 DIAGNOSIS — I428 Other cardiomyopathies: Secondary | ICD-10-CM

## 2011-09-23 DIAGNOSIS — I5023 Acute on chronic systolic (congestive) heart failure: Secondary | ICD-10-CM

## 2011-09-23 DIAGNOSIS — F17200 Nicotine dependence, unspecified, uncomplicated: Secondary | ICD-10-CM

## 2011-09-23 MED ORDER — RAMIPRIL 10 MG PO CAPS: 10.0000 mg | ORAL_CAPSULE | Freq: Every day | ORAL | Status: DC

## 2011-09-23 MED ORDER — POTASSIUM CHLORIDE CRYS ER 20 MEQ PO TBCR: 20.0000 meq | EXTENDED_RELEASE_TABLET | Freq: Every day | ORAL | Status: DC

## 2011-09-23 NOTE — Telephone Encounter (Signed)
Pt returning call from nurse. Please call back.

## 2011-09-23 NOTE — Telephone Encounter (Signed)
Spoke with pt, aware of labs and new med Fredia Beets

## 2011-09-25 ENCOUNTER — Other Ambulatory Visit: Payer: Self-pay | Admitting: Cardiovascular Disease

## 2011-09-25 ENCOUNTER — Inpatient Hospital Stay (HOSPITAL_BASED_OUTPATIENT_CLINIC_OR_DEPARTMENT_OTHER)
Admission: RE | Admit: 2011-09-25 | Discharge: 2011-09-25 | Disposition: A | Discharge status: Home / Self Care | Payer: Managed Care, Other (non HMO) | Source: Ambulatory Visit | Attending: Cardiovascular Disease | Admitting: Cardiovascular Disease

## 2011-09-25 ENCOUNTER — Other Ambulatory Visit: Payer: Self-pay | Admitting: Cardiology

## 2011-09-25 DIAGNOSIS — I6529 Occlusion and stenosis of unspecified carotid artery: Secondary | ICD-10-CM

## 2011-09-25 DIAGNOSIS — I2510 Atherosclerotic heart disease of native coronary artery without angina pectoris: Secondary | ICD-10-CM

## 2011-09-25 DIAGNOSIS — R0602 Shortness of breath: Secondary | ICD-10-CM | POA: Insufficient documentation

## 2011-09-25 DIAGNOSIS — R079 Chest pain, unspecified: Secondary | ICD-10-CM | POA: Insufficient documentation

## 2011-09-25 DIAGNOSIS — Z95810 Presence of automatic (implantable) cardiac defibrillator: Secondary | ICD-10-CM | POA: Insufficient documentation

## 2011-09-25 DIAGNOSIS — I2589 Other forms of chronic ischemic heart disease: Secondary | ICD-10-CM | POA: Insufficient documentation

## 2011-09-25 DIAGNOSIS — I208 Other forms of angina pectoris: Secondary | ICD-10-CM

## 2011-09-25 LAB — POCT I-STAT 3, VENOUS BLOOD GAS (G3P V)
Acid-Base Excess: 1 mmol/L (ref 0.0–2.0)
Bicarbonate: 28.2 mEq/L — ABNORMAL HIGH (ref 20.0–24.0)
TCO2: 30 mmol/L (ref 0–100)
pH, Ven: 7.334 — ABNORMAL HIGH (ref 7.250–7.300)

## 2011-09-25 LAB — POCT I-STAT 3, ART BLOOD GAS (G3+)
Acid-Base Excess: 3 mmol/L — ABNORMAL HIGH (ref 0.0–2.0)
Bicarbonate: 28.9 mEq/L — ABNORMAL HIGH (ref 20.0–24.0)
O2 Saturation: 91 %
TCO2: 30 mmol/L (ref 0–100)
pCO2 arterial: 48.7 mmHg — ABNORMAL HIGH (ref 35.0–45.0)
pH, Arterial: 7.381 (ref 7.350–7.400)
pO2, Arterial: 63 mmHg — ABNORMAL LOW (ref 80.0–100.0)

## 2011-09-25 MED ORDER — ISOSORBIDE MONONITRATE ER 30 MG PO TB24: 30.0000 mg | ORAL_TABLET | Freq: Every day | ORAL | Status: DC

## 2011-09-28 ENCOUNTER — Encounter: Payer: Managed Care, Other (non HMO) | Admitting: Cardiology

## 2011-09-28 ENCOUNTER — Encounter: Payer: Self-pay | Admitting: *Deleted

## 2011-10-01 NOTE — Cardiovascular Report (Signed)
  [name], [name]                 ACCOUNT NO.:  [id number]  MEDICAL RECORD NO.:  [id number]  LOCATION:                                 FACILITY:  PHYSICIAN:  [name]. Johnsie Cancel, MD, [facility]   DATE OF BIRTH:  DATE OF PROCEDURE: DATE OF DISCHARGE:                           CARDIAC CATHETERIZATION   A 60 year old patient with known ischemic cardiomyopathy and AICD, chest pain, and shortness of breath.  Right and left heart catheterization was done to rule out recurrent coronary artery disease and assess filling pressures.  Cine catheterization was done with 4-French right femoral arterial sheath and a 7-French right femoral venous sheath.  Standard JL-4 and JR- 4 catheters were used to engage the coronaries.  The patient received 2 mg of Versed for sedation.  Right heart pressures were as follows:  Mean right atrial pressure was 10.  RV pressure was 53/7.  PA pressure was 54/22 with a mean of 37. Mean pulmonary capillary wedge pressure was 21.  LV pressure was 120/12 with a post A-wave EDP of 22.  Aortic pressure was 168/57.  Fick cardiac output was 2.9 L/minute with a cardiac index of 1.8 L/minute per meter squared.  Aortic sat was 91%.  PA sat was 54%.  Left main coronary artery was somewhat calcified and a short segment without critical disease.  Left anterior descending artery was normal in its proximal and mid portion.  Distal portion after the takeoff of a medium-sized diagonal branch had a 40% discrete lesion.  Distal vessel beyond that was normal.  First diagonal branch was small and normal. Second diagonal branch was medium sized and normal.  Circumflex coronary artery was nondominant.  There was a 30% tubular lesion proximally.  First obtuse marginal branch had a 50% tubular lesion.  Distal AV groove branch was normal.  I did not think the OM lesion was flow limiting.  Right coronary artery was dominant.  There was a 30% tubular lesion proximally.  Mid and distal vessel  PDA and PLA were normal.  Due to the patient's high filling pressures, ventriculography was not performed.  IMPRESSION:  The patient has an ischemic cardiomyopathy.  She had previous angioplasty to the LAD and RCA, which appeared patent.  I do not think that the OM lesion is flow-limiting.  Her filling pressures would appear to be somewhat high.  Continued adjustment of her medications is in order.  She will continue her current dose of furosemide and metolazone.  She is on beta blockers.  We will consider adding nitrates.  She is also currently on Altace 10 mg a day.  She tolerated the procedure well.     [name]. Johnsie Cancel, MD, [facility]     PCN/MEDQ  D:  09/25/2011  T:  09/25/2011  Job:  [id number]  Electronically Signed by Jenkins Rouge MD [facility] on 10/01/2011 12:27:42 PM

## 2011-10-15 ENCOUNTER — Ambulatory Visit (INDEPENDENT_AMBULATORY_CARE_PROVIDER_SITE_OTHER): Payer: Managed Care, Other (non HMO) | Admitting: *Deleted

## 2011-10-15 ENCOUNTER — Other Ambulatory Visit: Payer: Self-pay

## 2011-10-15 DIAGNOSIS — I5023 Acute on chronic systolic (congestive) heart failure: Secondary | ICD-10-CM

## 2011-10-15 DIAGNOSIS — Z95810 Presence of automatic (implantable) cardiac defibrillator: Secondary | ICD-10-CM

## 2011-10-15 DIAGNOSIS — I428 Other cardiomyopathies: Secondary | ICD-10-CM

## 2011-10-16 ENCOUNTER — Ambulatory Visit (INDEPENDENT_AMBULATORY_CARE_PROVIDER_SITE_OTHER): Payer: Managed Care, Other (non HMO) | Admitting: Cardiovascular Disease

## 2011-10-16 ENCOUNTER — Encounter: Payer: Self-pay | Admitting: Cardiovascular Disease

## 2011-10-16 DIAGNOSIS — I10 Essential (primary) hypertension: Secondary | ICD-10-CM

## 2011-10-16 DIAGNOSIS — I2510 Atherosclerotic heart disease of native coronary artery without angina pectoris: Secondary | ICD-10-CM

## 2011-10-16 DIAGNOSIS — I509 Heart failure, unspecified: Secondary | ICD-10-CM

## 2011-10-16 DIAGNOSIS — Z95810 Presence of automatic (implantable) cardiac defibrillator: Secondary | ICD-10-CM

## 2011-10-16 NOTE — Assessment & Plan Note (Signed)
Well controlled.  Continue current medications and low sodium Dash type diet.    

## 2011-10-16 NOTE — Progress Notes (Signed)
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 EF by MRI 24% with significant apical and inferior scar. Prophylactic AICD placed by Dr Rayann Heman. 2/10. Normal device function on routine device clinic F/U. She is still depressed Paxil helps some . Mother died in 2010/08/11. Still smoking. Has program at work Failed hypnosis and gum. Told her we would start Zyban and patches. Link to vascular disease discussed Had CHF exacerbatin last Few weeks. Weight down 135 to 127 and has had a good response to zarozyln. She is having angina. In am and with stress and exertion. No prolonged episodes Encouraged her to use nitro if needed  Last cath 2007 with patent cors. Previous PCI of LAD and RCA.   Cath last month showed some elevation in right heart pressure  Right heart pressures were as follows:  Mean right atrial pressure was   10.  RV pressure was 53/7.  PA pressure was 54/22 with a mean of 37.   Mean pulmonary capillary wedge pressure was 21.  LV pressure was 120/12   with a post A-wave EDP of 22.  Aortic pressure was 168/57.  Fick cardiac   output was 2.9 L/minute with a cardiac index of 1.8 L/minute per meter   squared.  Aortic sat was 91%.  PA sat was 54%.  Stent to RCA and LAD patient  Residual 505 OM lesion  Not taking imdur.  Explained the benefit of it for CAD and CHF.  She will try it more PRN in future.    ROS: Denies fever, malais, weight loss, blurry vision, decreased visual acuity, cough, sputum, SOB, hemoptysis, pleuritic pain, palpitaitons, heartburn, abdominal pain, melena, lower extremity edema, claudication, or rash.  All other systems reviewed and negative  General: Affect appropriate Healthy:  appears stated age 60: normal Neck supple with no adenopathy JVP normal no bruits no thyromegaly Lungs clear with no wheezing and good diaphragmatic motion Heart:  S1/S2 no murmur,rub, gallop or click PMI normal Abdomen: benighn, BS positve, no tenderness, no AAA no bruit.   No HSM or HJR Distal pulses intact with no bruits No edema Neuro non-focal Skin warm and dry No muscular weakness   Current Outpatient Prescriptions  Medication Sig Dispense Refill  . aspirin 81 MG tablet Take 81 mg by mouth daily.        . Budesonide (PULMICORT IN) Inhale into the lungs as directed.        . Cyanocobalamin (VITAMIN B-12 CR PO) Take by mouth as directed.        . fexofenadine (ALLEGRA) 180 MG tablet Take 180 mg by mouth daily.        . fish oil-omega-3 fatty acids 1000 MG capsule Take 2 g by mouth daily.        . furosemide (LASIX) 20 MG tablet Take 1 tablet (20 mg total) by mouth daily.  90 tablet  1  . isosorbide mononitrate (IMDUR) 30 MG 24 hr tablet Take 1 tablet (30 mg total) by mouth daily.  30 tablet  11  . metolazone (ZAROXOLYN) 2.5 MG tablet 1 tab  Every other day       . metoprolol (TOPROL-XL) 50 MG 24 hr tablet Take 1 tablet (50 mg total) by mouth daily.  90 tablet  3  . omeprazole (PRILOSEC) 10 MG capsule Take 10 mg by mouth daily.        Marland Kitchen PARoxetine (PAXIL) 30 MG tablet Take 30 mg by mouth every morning.        Marland Kitchen  potassium chloride SA (K-DUR,KLOR-CON) 20 MEQ tablet Take 20 mEq by mouth daily. 1 tab po qd when pt remebers       . ramipril (ALTACE) 10 MG capsule Take 1 capsule (10 mg total) by mouth daily.  90 capsule  3    Allergies  Review of patient's allergies indicates no known allergies.  Electrocardiogram:  Assessment and Plan

## 2011-10-16 NOTE — Patient Instructions (Signed)
Your physician recommends that you schedule a follow-up appointment in: 3 months.  

## 2011-10-16 NOTE — Assessment & Plan Note (Signed)
Improved.  Continue current dose of diuretics.

## 2011-10-16 NOTE — Assessment & Plan Note (Signed)
Recent cath with no critical stenosis.  Continue ASA and Plavix

## 2011-10-16 NOTE — Assessment & Plan Note (Signed)
Rhythm stable no AICD shocks  F/U EPS

## 2011-10-18 LAB — REMOTE ICD DEVICE
BATTERY VOLTAGE: 3.17 V
BRDY-0002RV: 40 {beats}/min
CHARGE TIME: 9.158 s
HV IMPEDENCE: 80 Ohm
RV LEAD AMPLITUDE: 17.8 mv
RV LEAD IMPEDENCE ICD: 560 Ohm
TOT-0006: 20100212000000
TZAT-0001FASTVT: 1
TZAT-0001SLOWVT: 1
TZAT-0002FASTVT: NEGATIVE
TZAT-0012FASTVT: 200 ms
TZAT-0012SLOWVT: 200 ms
TZAT-0012SLOWVT: 200 ms
TZAT-0013SLOWVT: 1
TZAT-0013SLOWVT: 1
TZAT-0013SLOWVT: 1
TZAT-0018SLOWVT: NEGATIVE
TZAT-0019SLOWVT: 8 V
TZAT-0019SLOWVT: 8 V
TZAT-0019SLOWVT: 8 V
TZAT-0020SLOWVT: 1.5 ms
TZAT-0020SLOWVT: 1.5 ms
TZAT-0020SLOWVT: 1.5 ms
TZON-0003SLOWVT: 400 ms
TZON-0004SLOWVT: 12
TZST-0001FASTVT: 2
TZST-0001FASTVT: 4
TZST-0001SLOWVT: 4
TZST-0001SLOWVT: 6
TZST-0002FASTVT: NEGATIVE
TZST-0002FASTVT: NEGATIVE
TZST-0002FASTVT: NEGATIVE
TZST-0003SLOWVT: 35 J
VF: 0

## 2011-10-22 ENCOUNTER — Encounter: Payer: Self-pay | Admitting: *Deleted

## 2011-10-23 NOTE — Progress Notes (Signed)
Remote icd check with icm

## 2011-11-25 ENCOUNTER — Encounter: Payer: Self-pay | Admitting: Internal Medicine

## 2012-01-21 ENCOUNTER — Encounter: Payer: Managed Care, Other (non HMO) | Admitting: *Deleted

## 2012-01-25 ENCOUNTER — Encounter: Payer: Self-pay | Admitting: *Deleted

## 2012-01-27 ENCOUNTER — Ambulatory Visit: Payer: Managed Care, Other (non HMO) | Admitting: Cardiovascular Disease

## 2012-01-28 ENCOUNTER — Ambulatory Visit (INDEPENDENT_AMBULATORY_CARE_PROVIDER_SITE_OTHER): Payer: Managed Care, Other (non HMO) | Admitting: *Deleted

## 2012-01-28 ENCOUNTER — Encounter: Payer: Self-pay | Admitting: Internal Medicine

## 2012-01-28 DIAGNOSIS — Z95810 Presence of automatic (implantable) cardiac defibrillator: Secondary | ICD-10-CM

## 2012-01-28 DIAGNOSIS — I428 Other cardiomyopathies: Secondary | ICD-10-CM

## 2012-02-01 LAB — REMOTE ICD DEVICE
BRDY-0002RV: 40 {beats}/min
DEV-0020ICD: NEGATIVE
FVT: 0
RV LEAD IMPEDENCE ICD: 552 Ohm
TOT-0001: 1
TZAT-0001SLOWVT: 1
TZAT-0001SLOWVT: 2
TZAT-0001SLOWVT: 3
TZAT-0002FASTVT: NEGATIVE
TZAT-0004SLOWVT: 3
TZAT-0004SLOWVT: 8
TZAT-0004SLOWVT: 8
TZAT-0005SLOWVT: 75 pct
TZAT-0005SLOWVT: 88 pct
TZAT-0005SLOWVT: 91 pct
TZAT-0012FASTVT: 200 ms
TZAT-0018FASTVT: NEGATIVE
TZAT-0018SLOWVT: NEGATIVE
TZAT-0019FASTVT: 8 V
TZAT-0019SLOWVT: 8 V
TZAT-0020SLOWVT: 1.5 ms
TZAT-0020SLOWVT: 1.5 ms
TZON-0003VSLOWVT: 450 ms
TZST-0001FASTVT: 5
TZST-0001FASTVT: 6
TZST-0001SLOWVT: 4
TZST-0001SLOWVT: 5
TZST-0002FASTVT: NEGATIVE
TZST-0002FASTVT: NEGATIVE
TZST-0002FASTVT: NEGATIVE
TZST-0003SLOWVT: 35 J
TZST-0003SLOWVT: 7 J
VF: 0

## 2012-02-03 ENCOUNTER — Other Ambulatory Visit: Payer: Self-pay | Admitting: Family Medicine

## 2012-02-03 DIAGNOSIS — Z1231 Encounter for screening mammogram for malignant neoplasm of breast: Secondary | ICD-10-CM

## 2012-02-04 NOTE — Progress Notes (Signed)
Remote icd check  

## 2012-02-09 ENCOUNTER — Encounter: Payer: Self-pay | Admitting: *Deleted

## 2012-02-22 ENCOUNTER — Ambulatory Visit
Admission: RE | Admit: 2012-02-22 | Discharge: 2012-02-22 | Disposition: A | Discharge status: Home / Self Care | Payer: Managed Care, Other (non HMO) | Source: Ambulatory Visit | Attending: Family Medicine | Admitting: Family Medicine

## 2012-02-22 DIAGNOSIS — Z1231 Encounter for screening mammogram for malignant neoplasm of breast: Secondary | ICD-10-CM

## 2012-02-24 ENCOUNTER — Other Ambulatory Visit: Payer: Self-pay | Admitting: Family Medicine

## 2012-02-24 DIAGNOSIS — R928 Other abnormal and inconclusive findings on diagnostic imaging of breast: Secondary | ICD-10-CM

## 2012-02-29 ENCOUNTER — Ambulatory Visit (INDEPENDENT_AMBULATORY_CARE_PROVIDER_SITE_OTHER): Payer: Managed Care, Other (non HMO) | Admitting: Cardiovascular Disease

## 2012-02-29 ENCOUNTER — Encounter: Payer: Self-pay | Admitting: Cardiovascular Disease

## 2012-02-29 VITALS — BP 118/72 | HR 79 | Wt 133.8 lb

## 2012-02-29 DIAGNOSIS — I509 Heart failure, unspecified: Secondary | ICD-10-CM

## 2012-02-29 DIAGNOSIS — I2510 Atherosclerotic heart disease of native coronary artery without angina pectoris: Secondary | ICD-10-CM

## 2012-02-29 DIAGNOSIS — I10 Essential (primary) hypertension: Secondary | ICD-10-CM

## 2012-02-29 DIAGNOSIS — F329 Major depressive disorder, single episode, unspecified: Secondary | ICD-10-CM

## 2012-02-29 DIAGNOSIS — Z95810 Presence of automatic (implantable) cardiac defibrillator: Secondary | ICD-10-CM

## 2012-02-29 NOTE — Assessment & Plan Note (Signed)
No D/C has outpatient transtelephonic monitory

## 2012-02-29 NOTE — Assessment & Plan Note (Signed)
Well controlled.  Continue current medications and low sodium Dash type diet.    

## 2012-02-29 NOTE — Assessment & Plan Note (Signed)
No angina encouraged her to take imdur regularly

## 2012-02-29 NOTE — Assessment & Plan Note (Signed)
Continue antidpressant  F/u primary.  No improvement

## 2012-02-29 NOTE — Assessment & Plan Note (Signed)
Euvolemic with no CHF since last year  Continue current dose of meds

## 2012-02-29 NOTE — Patient Instructions (Signed)
Your physician wants you to follow-up in:  6 MONTHS WITH DR NISHAN  You will receive a reminder letter in the mail two months in advance. If you don't receive a letter, please call our office to schedule the follow-up appointment. Your physician recommends that you continue on your current medications as directed. Please refer to the Current Medication list given to you today. 

## 2012-02-29 NOTE — Progress Notes (Signed)
61 yo ischemic DCM with previous intervention to LAD and RCA. EF by MRI 24% with significant apical and inferior scar. Prophylactic AICD placed by Dr Rayann Heman. 2/10. Normal device function on routine device clinic F/U. She is still depressed Paxil helps some . Mother died in Aug 06, 2010. Still smoking. Has program at work Failed hypnosis and gum.Never took zyban or patches  Link to vascular disease discussed Had CHF exacerbatin last year but none since . No angina  Allergies starting to act up this Spring  Cath 10/12 showed some elevation in right heart pressure  Right heart pressures were as follows: Mean right atrial pressure was  10. RV pressure was 53/7. PA pressure was 54/22 with a mean of 37.  Mean pulmonary capillary wedge pressure was 21. LV pressure was 120/12  with a post A-wave EDP of 22. Aortic pressure was 168/57. Fick cardiac  output was 2.9 L/minute with a cardiac index of 1.8 L/minute per meter  squared. Aortic sat was 91%. PA sat was 54%.  Stent to RCA and LAD patient Residual 505 OM lesion  Still depressed and smoking  ROS: Denies fever, malais, weight loss, blurry vision, decreased visual acuity, cough, sputum, SOB, hemoptysis, pleuritic pain, palpitaitons, heartburn, abdominal pain, melena, lower extremity edema, claudication, or rash.  All other systems reviewed and negative  General: Affect appropriate Chronically ill female HEENT: normal Neck supple with no adenopathy JVP normal left carotid bruits no thyromegaly Lungs Rhonchi and wheezy  Heart:  S1/S2 no murmur, no rub, gallop or click PMI normal Abdomen: benighn, BS positve, no tenderness, no AAA no bruit.  No HSM or HJR Distal pulses intact with no bruits No edema Neuro non-focal Skin warm and dry No muscular weakness   Current Outpatient Prescriptions  Medication Sig Dispense Refill  . aspirin 81 MG tablet Take 81 mg by mouth daily.        . Cyanocobalamin (VITAMIN B-12 CR PO) Take by mouth as directed.         . fexofenadine (ALLEGRA) 180 MG tablet Take 180 mg by mouth as needed.       . fish oil-omega-3 fatty acids 1000 MG capsule Take 2 g by mouth daily.        . fluticasone (FLONASE) 50 MCG/ACT nasal spray Place 2 sprays into the nose daily.      . Fluticasone-Salmeterol (ADVAIR) 100-50 MCG/DOSE AEPB Inhale 1 puff into the lungs every 12 (twelve) hours.      . furosemide (LASIX) 20 MG tablet Take 1 tablet (20 mg total) by mouth daily.  90 tablet  1  . isosorbide mononitrate (IMDUR) 30 MG 24 hr tablet Take 1 tablet (30 mg total) by mouth daily.  30 tablet  11  . KP OMEPRAZOLE MAGNESIUM PO Take 12.6 mg by mouth daily.      . metolazone (ZAROXOLYN) 2.5 MG tablet Take 5 mg by mouth every 21 ( twenty-one) days. 1 tab  Every other day      . metoprolol (TOPROL-XL) 50 MG 24 hr tablet Take 1 tablet (50 mg total) by mouth daily.  90 tablet  3  . PARoxetine (PAXIL) 30 MG tablet Take 30 mg by mouth every morning.        . potassium chloride SA (K-DUR,KLOR-CON) 20 MEQ tablet Take 20 mEq by mouth daily. 1 tab po qd when pt remebers       . ramipril (ALTACE) 10 MG capsule Take 1 capsule (10 mg total) by mouth daily.  [place]  capsule  3    Allergies  Statins  Electrocardiogram:  Assessment and Plan

## 2012-03-18 ENCOUNTER — Encounter (HOSPITAL_COMMUNITY)
Admission: RE | Disposition: A | Discharge status: Home / Self Care | Payer: Self-pay | Source: Ambulatory Visit | Attending: Gastroenterology

## 2012-03-18 ENCOUNTER — Ambulatory Visit (HOSPITAL_COMMUNITY)
Admission: RE | Admit: 2012-03-18 | Discharge: 2012-03-18 | Disposition: A | Discharge status: Home / Self Care | Payer: Managed Care, Other (non HMO) | Source: Ambulatory Visit | Attending: Gastroenterology | Admitting: Gastroenterology

## 2012-03-18 ENCOUNTER — Encounter (HOSPITAL_COMMUNITY): Payer: Self-pay | Admitting: *Deleted

## 2012-03-18 DIAGNOSIS — I10 Essential (primary) hypertension: Secondary | ICD-10-CM | POA: Insufficient documentation

## 2012-03-18 DIAGNOSIS — I2510 Atherosclerotic heart disease of native coronary artery without angina pectoris: Secondary | ICD-10-CM | POA: Insufficient documentation

## 2012-03-18 DIAGNOSIS — I252 Old myocardial infarction: Secondary | ICD-10-CM | POA: Insufficient documentation

## 2012-03-18 DIAGNOSIS — Z1211 Encounter for screening for malignant neoplasm of colon: Secondary | ICD-10-CM | POA: Insufficient documentation

## 2012-03-18 DIAGNOSIS — K620 Anal polyp: Secondary | ICD-10-CM | POA: Insufficient documentation

## 2012-03-18 DIAGNOSIS — D126 Benign neoplasm of colon, unspecified: Secondary | ICD-10-CM | POA: Insufficient documentation

## 2012-03-18 DIAGNOSIS — K648 Other hemorrhoids: Secondary | ICD-10-CM | POA: Insufficient documentation

## 2012-03-18 DIAGNOSIS — K644 Residual hemorrhoidal skin tags: Secondary | ICD-10-CM | POA: Insufficient documentation

## 2012-03-18 DIAGNOSIS — Z95810 Presence of automatic (implantable) cardiac defibrillator: Secondary | ICD-10-CM | POA: Insufficient documentation

## 2012-03-18 HISTORY — DX: Heart failure, unspecified: I50.9

## 2012-03-18 HISTORY — DX: Personal history of other diseases of the digestive system: Z87.19

## 2012-03-18 HISTORY — PX: COLONOSCOPY: SHX5424

## 2012-03-18 SURGERY — COLONOSCOPY
Anesthesia: Moderate Sedation

## 2012-03-18 MED ORDER — FENTANYL CITRATE 0.05 MG/ML IJ SOLN
INTRAMUSCULAR | Status: AC
  Filled 2012-03-18: qty 2

## 2012-03-18 MED ORDER — FENTANYL NICU IV SYRINGE 50 MCG/ML
INJECTION | INTRAMUSCULAR | Status: DC | PRN
  Administered 2012-03-18 (×2): 25 ug via INTRAVENOUS

## 2012-03-18 MED ORDER — MIDAZOLAM HCL 5 MG/5ML IJ SOLN
INTRAMUSCULAR | Status: DC | PRN
  Administered 2012-03-18 (×2): 2 mg via INTRAVENOUS
  Administered 2012-03-18: 1 mg via INTRAVENOUS

## 2012-03-18 MED ORDER — MIDAZOLAM HCL 10 MG/2ML IJ SOLN
INTRAMUSCULAR | Status: AC
  Filled 2012-03-18: qty 2

## 2012-03-18 MED ORDER — SODIUM CHLORIDE 0.9 % IV SOLN
Freq: Once | INTRAVENOUS | Status: AC
  Administered 2012-03-18: 500 mL via INTRAVENOUS

## 2012-03-18 NOTE — Op Note (Signed)
[facility] [place], [place]  [id number]  OPERATIVE PROCEDURE REPORT  PATIENT:  Terri, Bowen  MR#:  [id number] BIRTHDATE:  23-Jul-1951  GENDER:  female ENDOSCOPIST:  Carol Ada, MD ASSISTANT:  Gunnar Fusi, RN and Blase Mess PROCEDURE DATE:  03/18/2012 PROCEDURE:  Colonoscopy with snare polypectomy ASA CLASS:  Class III INDICATIONS:  Screening MEDICATIONS:  Fentanyl 50 mcg IV, Versed 4 mg IV  DESCRIPTION OF PROCEDURE:   After the risks benefits and alternatives of the procedure were thoroughly explained, informed consent was obtained.  Digital rectal exam was performed and revealed no abnormalities.   The Pentax Colonoscope [id number] endoscope was introduced through the anus and advanced to the cecum, which was identified by both the appendix and ileocecal valve, without limitations.  The quality of the prep was excellent..  The instrument was then slowly withdrawn as the colon was fully examined. <<PROCEDUREIMAGES>>  FINDINGS:  A 3 mm sessile descending colon polyp was removed with a cold snare, however, it was not able to be recovered. The polyp appeared to be adenomatous. A 3 mm sessile rectal polyp was removed with a cold snare.   Retroflexed views in the rectum revealed internal and external hemorrhoids.    The scope was then withdrawn from the patient and the procedure terminated. CI 2:46 min, WD 15 min.  COMPLICATIONS:  None  IMPRESSION:  1) Polyp, multiple 2) Internal and external hemorrhoids RECOMMENDATIONS:  1) Await biopsy results 2) Repeat Colonoscopy in 5 years.  ______________________________ Carol Ada, MD  CPT CODES:  [phone] DIAGNOSIS CODES:  211.3, 4556, V76.51  CC:  Lanice Shirts, M.D.  n. Lorrin MaisCarol Ada at 03/18/2012 08:43 AM  Barrie Dunker, [place]:[id number]

## 2012-03-18 NOTE — H&P (Signed)
  Reason for Consult: Screening colonoscopy Referring Physician: Lynne Logan, M.D.  [name] HPI: 61 year old female referred for a screening colonoscopy.  She denies any problems with diarrhea, constipation, hematochezia, melena, abdominal pain, nausea, vomiting, GERD, or dysphagia.  She does have a defibrillator.  Past Medical History  Diagnosis Date  . Ischemic cardiomyopathy   . CAD (coronary artery disease)   . Chronic systolic dysfunction of left ventricle   . HTN (hypertension)   . Depression   . MI (myocardial infarction)   . ICD (implantable cardiac defibrillator) in place   . CHF (congestive heart failure)   . Asthma   . H/O hiatal hernia     Past Surgical History  Procedure Date  . Cardiac defibrillator placement 01/18/09    MDT implanted by JA  . Insert / replace / remove pacemaker     "pacemaker is not on"  . Coronary angioplasty   . Cardiac stents     History reviewed. No pertinent family history.  Social History:  reports that she has been smoking.  She does not have any smokeless tobacco history on file. She reports that she does not drink alcohol or use illicit drugs.  Allergies:  Allergies  Allergen Reactions  . Statins Other (See Comments)    Gradually tired.    Medications:  Scheduled:   . sodium chloride   Intravenous Once   Continuous:   No results found for this or any previous visit (from the past 24 hour(s)).   No results found.  ROS:  As stated above in the HPI otherwise negative.  Blood pressure 154/64, pulse 76, temperature 98.4 F (36.9 C), temperature source Oral, resp. rate 18, height 5\' 3"  (1.6 m), weight 56.7 kg (125 lb), SpO2 95.00%.    PE: Gen: NAD, Alert and Oriented HEENT:  [place]/AT, EOMI Neck: Supple, no LAD Lungs: CTA Bilaterally CV: RRR without M/G/R ABM: Soft, NTND, +BS Ext: No C/C/E  Assessment/Plan: 1) Screening colonoscopy.  Plan: 1) Colonoscopy.  Marquis Down D 03/18/2012, 8:03 AM

## 2012-03-18 NOTE — Interval H&P Note (Signed)
History and Physical Interval Note:  03/18/2012 8:05 AM  [name]  has presented today for surgery, with the diagnosis of screening/pt has a defib  The various methods of treatment have been discussed with the patient and family. After consideration of risks, benefits and other options for treatment, the patient has consented to  Procedure(s) (LRB): COLONOSCOPY (N/A) as a surgical intervention .  The patients' history has been reviewed, patient examined, no change in status, stable for surgery.  I have reviewed the patients' chart and labs.  Questions were answered to the patient's satisfaction.     Boomer Winders D

## 2012-03-18 NOTE — Discharge Instructions (Signed)
Colonoscopy Care After These instructions give you information on caring for yourself after your procedure. Your doctor may also give you more specific instructions. Call your doctor if you have any problems or questions after your procedure. HOME CARE  Take it easy for the next 24 hours.   Rest.   Walk or use warm packs on your belly (abdomen) if you have belly cramping or gas.   Do not drive for 24 hours.   You may shower.   Do not sign important papers or use machinery for 24 hours.   Drink enough fluids to keep your pee (urine) clear or pale yellow.   Resume your normal diet. Avoid heavy or fried foods.   Avoid alcohol.   Continue taking your normal medicines.   Only take medicine as told by your doctor. Do not take aspirin.  If you had growths (polyps) removed:  Do not take aspirin.   Do not drink alcohol for 7 days or as told by your doctor.   Eat a soft diet for 24 hours.  GET HELP RIGHT AWAY IF:  You have a fever.   You pass clumps of tissue (blood clots) or fill the toilet with blood.   You have belly pain that gets worse and medicine does not help.   Your belly is puffy (swollen).   You feel sick to your stomach (nauseous) or throw up (vomit).  MAKE SURE YOU:  Understand these instructions.   Will watch your condition.   Will get help right away if you are not doing well or get worse.  Document Released: 12/26/2010 Document Revised: 11/12/2011 Document Reviewed: 12/26/2010 [facility] Patient Information 2012 [place].

## 2012-03-21 ENCOUNTER — Encounter (HOSPITAL_COMMUNITY): Payer: Self-pay | Admitting: Gastroenterology

## 2012-03-29 ENCOUNTER — Telehealth: Payer: Self-pay | Admitting: Cardiovascular Disease

## 2012-03-29 ENCOUNTER — Other Ambulatory Visit: Payer: Self-pay | Admitting: *Deleted

## 2012-03-29 DIAGNOSIS — I10 Essential (primary) hypertension: Secondary | ICD-10-CM

## 2012-03-29 DIAGNOSIS — F17200 Nicotine dependence, unspecified, uncomplicated: Secondary | ICD-10-CM

## 2012-03-29 DIAGNOSIS — I5023 Acute on chronic systolic (congestive) heart failure: Secondary | ICD-10-CM

## 2012-03-29 DIAGNOSIS — I2589 Other forms of chronic ischemic heart disease: Secondary | ICD-10-CM

## 2012-03-29 DIAGNOSIS — I509 Heart failure, unspecified: Secondary | ICD-10-CM

## 2012-03-29 DIAGNOSIS — I428 Other cardiomyopathies: Secondary | ICD-10-CM

## 2012-03-29 MED ORDER — RAMIPRIL 10 MG PO CAPS: 10.0000 mg | ORAL_CAPSULE | Freq: Every day | ORAL | Status: DC

## 2012-03-29 MED ORDER — FUROSEMIDE 20 MG PO TABS: 20.0000 mg | ORAL_TABLET | Freq: Every day | ORAL | Status: DC

## 2012-03-29 NOTE — Telephone Encounter (Signed)
furosimide refill needed [facility] won't have enough to last until they deliver, needs 2 weeks worth  @ [facility] as well as ramipril

## 2012-04-05 ENCOUNTER — Other Ambulatory Visit: Payer: Self-pay | Admitting: Cardiovascular Disease

## 2012-04-05 MED ORDER — FUROSEMIDE 20 MG PO TABS: 20.0000 mg | ORAL_TABLET | Freq: Every day | ORAL | Status: DC

## 2012-04-13 ENCOUNTER — Telehealth: Payer: Self-pay | Admitting: Internal Medicine

## 2012-04-13 NOTE — Telephone Encounter (Signed)
PT AWARE  ONCE RECEIVES PAPERWORK TO DROP  OFF AT  OFFICE TO HAVE FILLED OUT. PT VERBALIZED UNDERSTANDING./CY

## 2012-04-13 NOTE — Telephone Encounter (Signed)
Will forward to Christine--pt follows with Dr Johnsie Cancel for her primary cardiologist

## 2012-04-13 NOTE — Telephone Encounter (Signed)
New msg Pt wants to talk to you about fmla paperwork. She said new paperwork has to be filled out every year. Please call

## 2012-05-24 ENCOUNTER — Telehealth: Payer: Self-pay | Admitting: Internal Medicine

## 2012-05-24 ENCOUNTER — Encounter: Payer: Self-pay | Admitting: Internal Medicine

## 2012-05-24 NOTE — Telephone Encounter (Signed)
05-24-12 PT PAST DUE WITH DEVICE CHECK, DUE IN April WITH [facility], OK TO SCHEDULE WITH [facility], ALL NUMBERS ARE INCORRECT OR OUT OF SERVICE, SENT LETTER/MT

## 2012-06-08 ENCOUNTER — Other Ambulatory Visit: Payer: Self-pay | Admitting: Cardiovascular Disease

## 2012-06-08 DIAGNOSIS — I5023 Acute on chronic systolic (congestive) heart failure: Secondary | ICD-10-CM

## 2012-06-08 DIAGNOSIS — I428 Other cardiomyopathies: Secondary | ICD-10-CM

## 2012-06-08 DIAGNOSIS — I509 Heart failure, unspecified: Secondary | ICD-10-CM

## 2012-06-08 DIAGNOSIS — I2589 Other forms of chronic ischemic heart disease: Secondary | ICD-10-CM

## 2012-06-08 DIAGNOSIS — F17200 Nicotine dependence, unspecified, uncomplicated: Secondary | ICD-10-CM

## 2012-06-08 DIAGNOSIS — I10 Essential (primary) hypertension: Secondary | ICD-10-CM

## 2012-06-08 MED ORDER — METOPROLOL SUCCINATE ER 50 MG PO TB24: 50.0000 mg | ORAL_TABLET | Freq: Every day | ORAL | Status: DC

## 2012-06-08 NOTE — Telephone Encounter (Signed)
Refill- Metoprolol  Patient needs temp supply of RX Metoprolol until mail order arrives, verified preferred as [facility]. She can be reached at [phone] for additional information.

## 2012-06-16 ENCOUNTER — Other Ambulatory Visit: Payer: Self-pay | Admitting: Internal Medicine

## 2012-06-16 DIAGNOSIS — I5023 Acute on chronic systolic (congestive) heart failure: Secondary | ICD-10-CM

## 2012-06-16 DIAGNOSIS — I10 Essential (primary) hypertension: Secondary | ICD-10-CM

## 2012-06-16 DIAGNOSIS — I509 Heart failure, unspecified: Secondary | ICD-10-CM

## 2012-06-16 DIAGNOSIS — I428 Other cardiomyopathies: Secondary | ICD-10-CM

## 2012-06-16 DIAGNOSIS — I2589 Other forms of chronic ischemic heart disease: Secondary | ICD-10-CM

## 2012-06-16 DIAGNOSIS — F17200 Nicotine dependence, unspecified, uncomplicated: Secondary | ICD-10-CM

## 2012-06-16 MED ORDER — METOPROLOL SUCCINATE ER 50 MG PO TB24: 50.0000 mg | ORAL_TABLET | Freq: Every day | ORAL | Status: DC

## 2012-06-16 NOTE — Telephone Encounter (Signed)
[facility] didn't receive rx please resend

## 2012-06-16 NOTE — Telephone Encounter (Signed)
Fax Received. Refill Completed. Loneta Tamplin Chowoe (R.M.A)   

## 2012-07-01 ENCOUNTER — Encounter: Payer: Self-pay | Admitting: Cardiology

## 2012-07-01 ENCOUNTER — Ambulatory Visit (INDEPENDENT_AMBULATORY_CARE_PROVIDER_SITE_OTHER): Payer: Managed Care, Other (non HMO) | Admitting: Cardiology

## 2012-07-01 ENCOUNTER — Encounter: Payer: Self-pay | Admitting: Internal Medicine

## 2012-07-01 VITALS — BP 116/82 | HR 77 | Ht 63.0 in | Wt 133.0 lb

## 2012-07-01 DIAGNOSIS — I2589 Other forms of chronic ischemic heart disease: Secondary | ICD-10-CM

## 2012-07-01 DIAGNOSIS — Z95810 Presence of automatic (implantable) cardiac defibrillator: Secondary | ICD-10-CM

## 2012-07-01 DIAGNOSIS — I509 Heart failure, unspecified: Secondary | ICD-10-CM

## 2012-07-01 LAB — ICD DEVICE OBSERVATION
BRDY-0002RV: 40 {beats}/min
DEV-0020ICD: NEGATIVE
RV LEAD IMPEDENCE ICD: 544 Ohm
TZAT-0001SLOWVT: 1
TZAT-0001SLOWVT: 2
TZAT-0001SLOWVT: 3
TZAT-0002FASTVT: NEGATIVE
TZAT-0005SLOWVT: 88 pct
TZAT-0012FASTVT: 200 ms
TZAT-0018SLOWVT: NEGATIVE
TZAT-0018SLOWVT: NEGATIVE
TZAT-0019FASTVT: 8 V
TZAT-0019SLOWVT: 8 V
TZAT-0019SLOWVT: 8 V
TZAT-0019SLOWVT: 8 V
TZAT-0020FASTVT: 1.5 ms
TZAT-0020SLOWVT: 1.5 ms
TZON-0004VSLOWVT: 20
TZON-0005SLOWVT: 12
TZST-0001FASTVT: 3
TZST-0001FASTVT: 5
TZST-0001SLOWVT: 4
TZST-0001SLOWVT: 6
TZST-0002FASTVT: NEGATIVE
TZST-0002FASTVT: NEGATIVE
TZST-0002FASTVT: NEGATIVE
TZST-0003SLOWVT: 35 J
TZST-0003SLOWVT: 7 J

## 2012-07-01 NOTE — Progress Notes (Signed)
ELECTROPHYSIOLOGY OFFICE NOTE  Patient ID: [name] MRN: [id number], DOB/AGE: January 18, 1951   Date of Visit: 07/01/2012  Primary Physician: Ailene Ards, RN Primary Cardiologist: Johnsie Cancel, MD Reason for Visit: Device follow-up  History of Present Illness [name] is a pleasant 61 year old woman with an ischemic CM s/p single chamber ICD implantation for primary prevention of SCD, chronic systolic CHF, HTN and asthma who presents today for device follow-up. She reports she has been feeling well from a cardiac standpoint. She reports her CHF symptoms are stable. She denies CP, SOB, palpitations, dizziness or LE swelling. She denies ICD shocks. She reports compliance with her medications.  Past Medical History  Diagnosis Date  . Ischemic cardiomyopathy   . CAD (coronary artery disease)   . Chronic systolic dysfunction of left ventricle   . HTN (hypertension)   . Depression   . MI (myocardial infarction)   . ICD (implantable cardiac defibrillator) in place   . CHF (congestive heart failure)   . Asthma   . H/O hiatal hernia     Past Surgical History  Procedure Date  . Cardiac defibrillator placement 01/18/09    MDT implanted by JA  . Insert / replace / remove pacemaker     "pacemaker is not on"  . Coronary angioplasty   . Cardiac stents   . Colonoscopy 03/18/2012    Procedure: COLONOSCOPY;  Surgeon: Beryle Beams, MD;  Location: [facility];  Service: Endoscopy;  Laterality: N/A;    Allergies/Intolerances Allergies  Allergen Reactions  . Statins Other (See Comments)    Gradually tired.   Current Home Medications Current Outpatient Prescriptions  Medication Sig Dispense Refill  . aspirin 81 MG tablet Take 81 mg by mouth daily.        . Cyanocobalamin (VITAMIN B-12 CR PO) Take by mouth as directed.        . fexofenadine (ALLEGRA) 180 MG tablet Take 180 mg by mouth as needed.       . fish oil-omega-3 fatty acids 1000 MG capsule Take 2 g by mouth daily.          . fluticasone (FLONASE) 50 MCG/ACT nasal spray Place 2 sprays into the nose daily.      . Fluticasone-Salmeterol (ADVAIR) 100-50 MCG/DOSE AEPB Inhale 1 puff into the lungs every 12 (twelve) hours.      . furosemide (LASIX) 20 MG tablet Take 1 tablet (20 mg total) by mouth daily.  90 tablet  3  . isosorbide mononitrate (IMDUR) 30 MG 24 hr tablet Take 1 tablet (30 mg total) by mouth daily.  30 tablet  11  . KP OMEPRAZOLE MAGNESIUM PO Take 12.6 mg by mouth daily.      . metolazone (ZAROXOLYN) 2.5 MG tablet Take 5 mg by mouth every 21 ( twenty-one) days. 1 tab  Every other day      . metoprolol succinate (TOPROL-XL) 50 MG 24 hr tablet Take 1 tablet (50 mg total) by mouth daily.  90 tablet  3  . PARoxetine (PAXIL) 30 MG tablet Take 30 mg by mouth every morning.        . potassium chloride SA (K-DUR,KLOR-CON) 20 MEQ tablet Take 20 mEq by mouth daily. 1 tab po qd when pt remebers       . ramipril (ALTACE) 10 MG capsule Take 1 capsule (10 mg total) by mouth daily.  30 capsule  0  . DISCONTD: Budesonide (PULMICORT IN) Inhale into the lungs as directed.        Marland Kitchen  DISCONTD: omeprazole (PRILOSEC) 10 MG capsule Take 10 mg by mouth daily.        Marland Kitchen DISCONTD: potassium chloride SA (K-DUR,KLOR-CON) 20 MEQ tablet Take 1 tablet (20 mEq total) by mouth daily.  30 tablet  12   Social History Social History  . Marital Status: Divorced    Spouse Name: N/A    Number of Children: 2  . Years of Education: N/A   Occupational History  . Works for [employer] History Main Topics  . Smoking status: Current Everyday Smoker  . Smokeless tobacco: Not on file  . Alcohol Use: No  . Drug Use: No  . Sexually Active: No   Social History Narrative   Lives in [place]    Review of Systems General:  No chills, fever, night sweats or weight changes Cardiovascular:  No chest pain, dyspnea on exertion, edema, orthopnea, palpitations, paroxysmal nocturnal dyspnea Dermatological: No rash, lesions or  masses Respiratory: No cough, dyspnea Urologic: No hematuria, dysuria Abdominal:   No nausea, vomiting, diarrhea, bright red blood per rectum, melena, or hematemesis Neurologic:  No visual changes, weakness, changes in mental status All other systems reviewed and are otherwise negative except as noted above.  Physical Exam Blood pressure 116/82, pulse 77, height 5\' 3"  (1.6 m), weight 133 lb (60.328 kg).  General: Well developed, well appearing 61 year old female in no acute distress. HEENT: Normocephalic, atraumatic. EOMs intact. Sclera nonicteric. Oropharynx clear.  Neck: Supple. No JVD. Lungs: Respirations regular and unlabored, CTA bilaterally. No wheezes, rales or rhonchi. Heart: RRR. S1, S2 present. No murmurs, rub, S3 or S4. Abdomen: Soft, non-distended.  Extremities: No clubbing, cyanosis or edema. DP/PT/Radials 2+ and equal bilaterally. Psych: Normal affect. Neuro: Alert and oriented X 3. Moves all extremities spontaneously.   Diagnostics Device interrogation shows normal single chamber ICD function with good battery status and stable lead measurements/parameters; LIA software was added per protocol; no other programming changes were made; see PaceArt report  Assessment and Plan 1. Ischemic cardiomyopathy s/p single chamber ICD implantation - normal device function; continue remote follow-up every 3 months; follow-up with Dr. Rayann Heman in one year unless needed sooner 2. CAD - stable without anginal symptoms; continue follow-up with primary cardiologist, Dr. Johnsie Cancel, as scheduled 3. Chronic systolic CHF - appears euvolemic by exam today; continue medical therapy, low sodium diet and routine follow-up  [name] expressed verbal understanding and agrees with this plan of care. Signed, Ileene Hutchinson, PA-C 07/01/2012, 4:12 PM

## 2012-07-01 NOTE — Patient Instructions (Signed)
Remote monitoring is used to monitor your Pacemaker of ICD from home. This monitoring reduces the number of office visits required to check your device to one time per year. It allows [place] to keep an eye on the functioning of your device to ensure it is working properly. You are scheduled for a device check from home on October 03, 2012. You may send your transmission at any time that day. If you have a wireless device, the transmission will be sent automatically. After your physician reviews your transmission, you will receive a postcard with your next transmission date.  Your physician wants you to follow-up in: 1 year with Dr Rayann Heman.  You will receive a reminder letter in the mail two months in advance. If you don't receive a letter, please call our office to schedule the follow-up appointment.

## 2012-10-03 ENCOUNTER — Encounter: Payer: Managed Care, Other (non HMO) | Admitting: *Deleted

## 2012-10-10 ENCOUNTER — Telehealth: Payer: Self-pay | Admitting: Cardiovascular Disease

## 2012-10-10 DIAGNOSIS — I428 Other cardiomyopathies: Secondary | ICD-10-CM

## 2012-10-10 DIAGNOSIS — I509 Heart failure, unspecified: Secondary | ICD-10-CM

## 2012-10-10 DIAGNOSIS — I10 Essential (primary) hypertension: Secondary | ICD-10-CM

## 2012-10-10 DIAGNOSIS — I5023 Acute on chronic systolic (congestive) heart failure: Secondary | ICD-10-CM

## 2012-10-10 DIAGNOSIS — F17200 Nicotine dependence, unspecified, uncomplicated: Secondary | ICD-10-CM

## 2012-10-10 DIAGNOSIS — I2589 Other forms of chronic ischemic heart disease: Secondary | ICD-10-CM

## 2012-10-10 MED ORDER — RAMIPRIL 10 MG PO CAPS: 10.0000 mg | ORAL_CAPSULE | Freq: Every day | ORAL | Status: DC

## 2012-10-10 NOTE — Telephone Encounter (Signed)
New Problem:    Patient called in needing a 15 day refill of her ramipril (ALTACE) 10 MG capsule [facility] [place]. And a regular refill of her ramipril (ALTACE) 10 MG capsule sent into the pharmacy listed on file.  Patient is completely out of her medication.

## 2012-10-11 ENCOUNTER — Encounter: Payer: Self-pay | Admitting: *Deleted

## 2012-10-25 ENCOUNTER — Other Ambulatory Visit: Payer: Self-pay | Admitting: *Deleted

## 2012-10-25 DIAGNOSIS — F17200 Nicotine dependence, unspecified, uncomplicated: Secondary | ICD-10-CM

## 2012-10-25 DIAGNOSIS — I5023 Acute on chronic systolic (congestive) heart failure: Secondary | ICD-10-CM

## 2012-10-25 DIAGNOSIS — I509 Heart failure, unspecified: Secondary | ICD-10-CM

## 2012-10-25 DIAGNOSIS — I2589 Other forms of chronic ischemic heart disease: Secondary | ICD-10-CM

## 2012-10-25 MED ORDER — RAMIPRIL 10 MG PO CAPS: 10.0000 mg | ORAL_CAPSULE | Freq: Every day | ORAL | Status: DC

## 2012-10-31 ENCOUNTER — Encounter: Payer: Self-pay | Admitting: Internal Medicine

## 2012-10-31 ENCOUNTER — Ambulatory Visit (INDEPENDENT_AMBULATORY_CARE_PROVIDER_SITE_OTHER): Payer: Managed Care, Other (non HMO) | Admitting: *Deleted

## 2012-10-31 DIAGNOSIS — Z95810 Presence of automatic (implantable) cardiac defibrillator: Secondary | ICD-10-CM

## 2012-10-31 DIAGNOSIS — I2589 Other forms of chronic ischemic heart disease: Secondary | ICD-10-CM

## 2012-11-02 LAB — REMOTE ICD DEVICE
BATTERY VOLTAGE: 3.14 V
DEV-0020ICD: NEGATIVE
PACEART VT: 0
TOT-0001: 1
TOT-0006: 20100212000000
TZAT-0004SLOWVT: 3
TZAT-0004SLOWVT: 8
TZAT-0004SLOWVT: 8
TZAT-0011SLOWVT: 10 ms
TZAT-0011SLOWVT: 10 ms
TZAT-0012FASTVT: 200 ms
TZAT-0012SLOWVT: 200 ms
TZAT-0012SLOWVT: 200 ms
TZAT-0013SLOWVT: 1
TZAT-0018FASTVT: NEGATIVE
TZAT-0020FASTVT: 1.5 ms
TZAT-0020SLOWVT: 1.5 ms
TZON-0003SLOWVT: 400 ms
TZST-0001FASTVT: 2
TZST-0001FASTVT: 6
TZST-0001SLOWVT: 4
TZST-0002FASTVT: NEGATIVE
TZST-0003SLOWVT: 35 J
TZST-0003SLOWVT: 35 J
TZST-0003SLOWVT: 7 J
VENTRICULAR PACING ICD: 0.03 pct

## 2012-11-04 ENCOUNTER — Telehealth: Payer: Self-pay

## 2012-11-04 NOTE — Telephone Encounter (Signed)
Pt called stating that she has been waiting over 3 wks for her refill for ramipril. I called [place] [facility] they advised me that they received the refill and were processing it should ship out next week. Called pt back to advise her of the status

## 2012-11-16 ENCOUNTER — Encounter: Payer: Self-pay | Admitting: *Deleted

## 2012-12-01 ENCOUNTER — Other Ambulatory Visit: Payer: Self-pay

## 2012-12-01 DIAGNOSIS — I208 Other forms of angina pectoris: Secondary | ICD-10-CM

## 2012-12-01 MED ORDER — ISOSORBIDE MONONITRATE ER 30 MG PO TB24: 30.0000 mg | ORAL_TABLET | Freq: Every day | ORAL | Status: DC

## 2012-12-08 ENCOUNTER — Other Ambulatory Visit: Payer: Self-pay | Admitting: *Deleted

## 2012-12-08 DIAGNOSIS — I5023 Acute on chronic systolic (congestive) heart failure: Secondary | ICD-10-CM

## 2012-12-08 DIAGNOSIS — F17200 Nicotine dependence, unspecified, uncomplicated: Secondary | ICD-10-CM

## 2012-12-08 DIAGNOSIS — I2589 Other forms of chronic ischemic heart disease: Secondary | ICD-10-CM

## 2012-12-08 DIAGNOSIS — I509 Heart failure, unspecified: Secondary | ICD-10-CM

## 2012-12-08 MED ORDER — RAMIPRIL 10 MG PO CAPS: 10.0000 mg | ORAL_CAPSULE | Freq: Every day | ORAL | Status: DC

## 2013-02-06 ENCOUNTER — Other Ambulatory Visit: Payer: Self-pay | Admitting: Internal Medicine

## 2013-02-06 ENCOUNTER — Encounter: Payer: Self-pay | Admitting: Internal Medicine

## 2013-02-06 ENCOUNTER — Ambulatory Visit (INDEPENDENT_AMBULATORY_CARE_PROVIDER_SITE_OTHER): Payer: Managed Care, Other (non HMO) | Admitting: *Deleted

## 2013-02-06 DIAGNOSIS — Z95810 Presence of automatic (implantable) cardiac defibrillator: Secondary | ICD-10-CM

## 2013-02-06 DIAGNOSIS — I509 Heart failure, unspecified: Secondary | ICD-10-CM

## 2013-02-08 LAB — REMOTE ICD DEVICE
BATTERY VOLTAGE: 3.11 V
BRDY-0002RV: 40 {beats}/min
CHARGE TIME: 9.519 s
HV IMPEDENCE: 74 Ohm
RV LEAD AMPLITUDE: 17.1 mv
TOT-0006: 20100212000000
TZAT-0001FASTVT: 1
TZAT-0001SLOWVT: 1
TZAT-0001SLOWVT: 2
TZAT-0001SLOWVT: 3
TZAT-0002FASTVT: NEGATIVE
TZAT-0004SLOWVT: 8
TZAT-0004SLOWVT: 8
TZAT-0012FASTVT: 200 ms
TZAT-0012SLOWVT: 200 ms
TZAT-0012SLOWVT: 200 ms
TZAT-0013SLOWVT: 1
TZAT-0013SLOWVT: 1
TZAT-0018FASTVT: NEGATIVE
TZAT-0018SLOWVT: NEGATIVE
TZAT-0019SLOWVT: 8 V
TZAT-0019SLOWVT: 8 V
TZAT-0020SLOWVT: 1.5 ms
TZAT-0020SLOWVT: 1.5 ms
TZAT-0020SLOWVT: 1.5 ms
TZON-0003SLOWVT: 400 ms
TZST-0001FASTVT: 4
TZST-0001FASTVT: 5
TZST-0001FASTVT: 6
TZST-0001SLOWVT: 4
TZST-0001SLOWVT: 6
TZST-0002FASTVT: NEGATIVE
TZST-0002FASTVT: NEGATIVE
TZST-0002FASTVT: NEGATIVE
TZST-0003SLOWVT: 35 J
TZST-0003SLOWVT: 35 J
VENTRICULAR PACING ICD: 0 pct
VF: 0

## 2013-02-17 ENCOUNTER — Encounter: Payer: Self-pay | Admitting: *Deleted

## 2013-03-01 ENCOUNTER — Other Ambulatory Visit: Payer: Self-pay | Admitting: Gastroenterology

## 2013-03-01 DIAGNOSIS — R1013 Epigastric pain: Secondary | ICD-10-CM

## 2013-03-06 ENCOUNTER — Other Ambulatory Visit: Payer: Self-pay | Admitting: *Deleted

## 2013-03-06 ENCOUNTER — Ambulatory Visit
Admission: RE | Admit: 2013-03-06 | Discharge: 2013-03-06 | Disposition: A | Discharge status: Home / Self Care | Payer: BC Managed Care – PPO | Source: Ambulatory Visit | Attending: Gastroenterology | Admitting: Gastroenterology

## 2013-03-06 DIAGNOSIS — R1013 Epigastric pain: Secondary | ICD-10-CM

## 2013-03-06 MED ORDER — METOLAZONE 2.5 MG PO TABS: ORAL_TABLET | ORAL | Status: DC

## 2013-03-06 MED ORDER — POTASSIUM CHLORIDE CRYS ER 20 MEQ PO TBCR: 20.0000 meq | EXTENDED_RELEASE_TABLET | Freq: Every day | ORAL | Status: DC

## 2013-03-07 ENCOUNTER — Other Ambulatory Visit: Payer: Self-pay | Admitting: Gastroenterology

## 2013-03-10 ENCOUNTER — Encounter (HOSPITAL_COMMUNITY): Payer: Self-pay | Admitting: *Deleted

## 2013-03-10 ENCOUNTER — Ambulatory Visit (HOSPITAL_COMMUNITY)
Admission: RE | Admit: 2013-03-10 | Discharge: 2013-03-10 | Disposition: A | Discharge status: Home / Self Care | Payer: BC Managed Care – PPO | Source: Ambulatory Visit | Attending: Gastroenterology | Admitting: Gastroenterology

## 2013-03-10 ENCOUNTER — Encounter (HOSPITAL_COMMUNITY)
Admission: RE | Disposition: A | Discharge status: Home / Self Care | Payer: Self-pay | Source: Ambulatory Visit | Attending: Gastroenterology

## 2013-03-10 DIAGNOSIS — I2589 Other forms of chronic ischemic heart disease: Secondary | ICD-10-CM | POA: Insufficient documentation

## 2013-03-10 DIAGNOSIS — I252 Old myocardial infarction: Secondary | ICD-10-CM | POA: Insufficient documentation

## 2013-03-10 DIAGNOSIS — I10 Essential (primary) hypertension: Secondary | ICD-10-CM | POA: Insufficient documentation

## 2013-03-10 DIAGNOSIS — R1013 Epigastric pain: Secondary | ICD-10-CM | POA: Insufficient documentation

## 2013-03-10 DIAGNOSIS — I2510 Atherosclerotic heart disease of native coronary artery without angina pectoris: Secondary | ICD-10-CM | POA: Insufficient documentation

## 2013-03-10 DIAGNOSIS — R1310 Dysphagia, unspecified: Secondary | ICD-10-CM | POA: Insufficient documentation

## 2013-03-10 DIAGNOSIS — K219 Gastro-esophageal reflux disease without esophagitis: Secondary | ICD-10-CM | POA: Insufficient documentation

## 2013-03-10 DIAGNOSIS — Z95810 Presence of automatic (implantable) cardiac defibrillator: Secondary | ICD-10-CM | POA: Insufficient documentation

## 2013-03-10 DIAGNOSIS — F17200 Nicotine dependence, unspecified, uncomplicated: Secondary | ICD-10-CM | POA: Insufficient documentation

## 2013-03-10 DIAGNOSIS — R6889 Other general symptoms and signs: Secondary | ICD-10-CM | POA: Insufficient documentation

## 2013-03-10 HISTORY — PX: ESOPHAGOGASTRODUODENOSCOPY: SHX5428

## 2013-03-10 HISTORY — DX: Unspecified osteoarthritis, unspecified site: M19.90

## 2013-03-10 SURGERY — EGD (ESOPHAGOGASTRODUODENOSCOPY)
Anesthesia: Moderate Sedation

## 2013-03-10 MED ORDER — SODIUM CHLORIDE 0.9 % IV SOLN
INTRAVENOUS | Status: DC
  Administered 2013-03-10: 500 mL via INTRAVENOUS

## 2013-03-10 MED ORDER — BUTAMBEN-TETRACAINE-BENZOCAINE 2-2-14 % EX AERO
INHALATION_SPRAY | CUTANEOUS | Status: DC | PRN
  Administered 2013-03-10: 2 via TOPICAL

## 2013-03-10 MED ORDER — FENTANYL CITRATE 0.05 MG/ML IJ SOLN
INTRAMUSCULAR | Status: DC | PRN
  Administered 2013-03-10 (×2): 25 ug via INTRAVENOUS

## 2013-03-10 MED ORDER — DIPHENHYDRAMINE HCL 50 MG/ML IJ SOLN
INTRAMUSCULAR | Status: AC
Start: 2013-03-10 — End: 2013-03-10
  Filled 2013-03-10: qty 1

## 2013-03-10 MED ORDER — MIDAZOLAM HCL 10 MG/2ML IJ SOLN
INTRAMUSCULAR | Status: DC | PRN
  Administered 2013-03-10 (×2): 2 mg via INTRAVENOUS

## 2013-03-10 MED ORDER — FENTANYL CITRATE 0.05 MG/ML IJ SOLN
INTRAMUSCULAR | Status: AC
  Filled 2013-03-10: qty 2

## 2013-03-10 MED ORDER — MIDAZOLAM HCL 10 MG/2ML IJ SOLN
INTRAMUSCULAR | Status: AC
  Filled 2013-03-10: qty 2

## 2013-03-10 NOTE — Op Note (Signed)
[facility] [place], [id number]   OPERATIVE PROCEDURE REPORT  PATIENT: [name], [name]  MR#: [id number] BIRTHDATE: Apr 16, 1951  GENDER: Female ENDOSCOPIST: Carol Ada, MD ASSISTANT:   Wray Kearns, RN and Charlyne Quale, technician PROCEDURE DATE: 03/10/2013 PROCEDURE:   EGD w/ biopsy ASA CLASS:   Class III INDICATIONS:Dysphagia. MEDICATIONS: Versed 4 mg IV and Fentanyl 50 mcg IV TOPICAL ANESTHETIC:   none  DESCRIPTION OF PROCEDURE:   After the risks benefits and alternatives of the procedure were thoroughly explained, informed consent was obtained.  The endoscope [id number]  endoscope was introduced through the mouth  and advanced to the second portion of the duodenum Without limitations.      The instrument was slowly withdrawn as the mucosa was fully examined.    FINDINGS: The upper, middle and distal third of the esophagus were carefully inspected and no abnormalities were noted.  The z-line was well seen at the GEJ.  Random biopsies of the esophagus were obtained to evaluate for EoE.  The endoscope was pushed into the fundus which was normal including a retroflexed view.  The antrum, gastric body, first and second part of the duodenum were unremarkable.   Retroflexed views revealed no abnormalities. The scope was then withdrawn from the patient and the procedure terminated.  COMPLICATIONS: There were no complications. IMPRESSION: 1) Normal EGD, however, there was some resistance with passage through the UES.  ? Hypertensive UES.  RECOMMENDATIONS: 1) Await biopsy results. 2) If it is negative for EoE a manometry will be scheduled. 3) Continue with Prilosec.   _______________________________ Lorrin MaisCarol Ada, MD 03/10/2013 10:29 AM

## 2013-03-10 NOTE — H&P (Signed)
Reason for Consult: ABM pain and Dysphagia Referring Physician: Lynne Logan, M.D.  Nadara Mustard HPI: This is a 62 year old female with complaints of a "bad taste" in her mouth and at times she feels as if she is choking.  There is an epigastric discomfort.  Touching the area causes her to have pain and there is radiation to her back.  She takes Prilosec 40 mg QD for the past year and it has helped with her symptoms of GERD, however, she reports that her current symptoms are not the same as her GERD.  She denies any vomiting, but she does feel ill at times.  It does not matter if she eats or does not eat, her symptoms are about the same.  Past Medical History  Diagnosis Date  . Ischemic cardiomyopathy   . CAD (coronary artery disease)   . Chronic systolic dysfunction of left ventricle   . HTN (hypertension)   . Depression   . MI (myocardial infarction)   . ICD (implantable cardiac defibrillator) in place   . CHF (congestive heart failure)   . Asthma   . H/O hiatal hernia   . Arthritis     hands    Past Surgical History  Procedure Laterality Date  . Cardiac defibrillator placement  01/18/09    MDT implanted by [facility]  . Insert / replace / remove pacemaker      "pacemaker is not on"  . Coronary angioplasty    . Cardiac stents    . Colonoscopy  03/18/2012    Procedure: COLONOSCOPY;  Surgeon: Beryle Beams, MD;  Location: [facility];  Service: Endoscopy;  Laterality: N/A;    History reviewed. No pertinent family history.  Social History:  reports that she has been smoking Cigarettes.  She has been smoking about 0.00 packs per day. She has never used smokeless tobacco. She reports that she does not drink alcohol or use illicit drugs.  Allergies:  Allergies  Allergen Reactions  . Statins Other (See Comments)    Gradually tired.    Medications:  Scheduled:  Continuous: . sodium chloride 500 mL (03/10/13 1004)    No results found for this or any previous visit (from the  past 24 hour(s)).   No results found.  ROS:  As stated above in the HPI otherwise negative.  Blood pressure 106/54, resp. rate 16, height 5' 3.5" (1.613 m), weight 124 lb (56.246 kg), SpO2 96.00%.    PE: Gen: NAD, Alert and Oriented HEENT:  [place]/AT, EOMI Neck: Supple, no LAD Lungs: CTA Bilaterally CV: RRR without M/G/R ABM: Soft, NTND, +BS Ext: No C/C/E  Assessment/Plan: 1) Dysphagia. 2) GERD. 3) Epigastric discomfort.  Plan: 1) EGD.  Kambrey Hagger D 03/10/2013, 10:06 AM

## 2013-03-13 ENCOUNTER — Encounter (HOSPITAL_COMMUNITY): Payer: Self-pay | Admitting: Gastroenterology

## 2013-04-05 ENCOUNTER — Other Ambulatory Visit: Payer: Self-pay | Admitting: *Deleted

## 2013-04-05 MED ORDER — POTASSIUM CHLORIDE CRYS ER 20 MEQ PO TBCR: 20.0000 meq | EXTENDED_RELEASE_TABLET | Freq: Every day | ORAL | Status: DC

## 2013-04-14 ENCOUNTER — Other Ambulatory Visit: Payer: Self-pay | Admitting: *Deleted

## 2013-04-14 MED ORDER — FUROSEMIDE 20 MG PO TABS: 20.0000 mg | ORAL_TABLET | Freq: Every day | ORAL | Status: DC

## 2013-04-14 NOTE — Telephone Encounter (Signed)
Fax Received. Refill Completed. [name] (R.M.A)   

## 2013-06-05 ENCOUNTER — Ambulatory Visit (INDEPENDENT_AMBULATORY_CARE_PROVIDER_SITE_OTHER): Payer: BC Managed Care – PPO | Admitting: Cardiovascular Disease

## 2013-06-05 ENCOUNTER — Encounter: Payer: Self-pay | Admitting: Cardiovascular Disease

## 2013-06-05 VITALS — BP 112/74 | HR 81 | Ht 63.5 in | Wt 129.4 lb

## 2013-06-05 DIAGNOSIS — M79606 Pain in leg, unspecified: Secondary | ICD-10-CM | POA: Insufficient documentation

## 2013-06-05 DIAGNOSIS — I509 Heart failure, unspecified: Secondary | ICD-10-CM

## 2013-06-05 DIAGNOSIS — I2510 Atherosclerotic heart disease of native coronary artery without angina pectoris: Secondary | ICD-10-CM

## 2013-06-05 DIAGNOSIS — I5022 Chronic systolic (congestive) heart failure: Secondary | ICD-10-CM

## 2013-06-05 DIAGNOSIS — F17200 Nicotine dependence, unspecified, uncomplicated: Secondary | ICD-10-CM

## 2013-06-05 DIAGNOSIS — I10 Essential (primary) hypertension: Secondary | ICD-10-CM

## 2013-06-05 DIAGNOSIS — Z79899 Other long term (current) drug therapy: Secondary | ICD-10-CM

## 2013-06-05 DIAGNOSIS — Z95810 Presence of automatic (implantable) cardiac defibrillator: Secondary | ICD-10-CM

## 2013-06-05 DIAGNOSIS — M79605 Pain in left leg: Secondary | ICD-10-CM

## 2013-06-05 DIAGNOSIS — M79609 Pain in unspecified limb: Secondary | ICD-10-CM

## 2013-06-05 NOTE — Assessment & Plan Note (Signed)
Well controlled.  Continue current medications and low sodium Dash type diet.    

## 2013-06-05 NOTE — Assessment & Plan Note (Signed)
Euvolemic continue PRN diuretic.

## 2013-06-05 NOTE — Progress Notes (Signed)
Patient ID: [name], female   DOB: [birth year], 62 y.o.   MRN: [id number] 62 yo ischemic DCM with previous intervention to LAD and RCA. EF by MRI 24% with significant apical and inferior scar. Prophylactic AICD placed by Dr Rayann Heman. 2/10. Normal device function on routine device clinic F/U. She is still depressed Paxil helps some . Mother died in 08-30-10. Still smoking. Has program at work Failed hypnosis and gum.Never took zyban or patches Link to vascular disease discussed Had CHF exacerbatin last year but none since . No angina Allergies starting to act up this Spring  Cath 10/12 showed some elevation in right heart pressure   Right heart pressures were as follows: Mean right atrial pressure was  10. RV pressure was 53/7. PA pressure was 54/22 with a mean of 37.  Mean pulmonary capillary wedge pressure was 21. LV pressure was 120/12  with a post A-wave EDP of 22. Aortic pressure was 168/57. Fick cardiac  output was 2.9 L/minute with a cardiac index of 1.8 L/minute per meter  squared. Aortic sat was 91%. PA sat was 54%.   Stent to RCA and LAD patient Residual 505 OM lesion   Still depressed and smoking  Having some LLE pain with ambulation.  Needs cholesterol rechecked after stopping statin for side effects  ROS: Denies fever, malais, weight loss, blurry vision, decreased visual acuity, cough, sputum, SOB, hemoptysis, pleuritic pain, palpitaitons, heartburn, abdominal pain, melena, lower extremity edema, claudication, or rash.  All other systems reviewed and negative  General: Affect appropriate Pale chronically ill depressed female HEENT: normal Neck supple with no adenopathy JVP normal no bruits no thyromegaly Lungs clear with no wheezing and good diaphragmatic motion Heart:  [id number] systolic murmur, no rub, gallop or click PMI normal Abdomen: benighn, BS positve, no tenderness, no AAA no bruit.  No HSM or HJR Distal pulses intact with no bruits No edema Neuro non-focal Skin  warm and dry No muscular weakness   Current Outpatient Prescriptions  Medication Sig Dispense Refill  . aspirin 81 MG tablet Take 81 mg by mouth daily.        . Cyanocobalamin (VITAMIN B-12 CR PO) Take by mouth as directed.        . fexofenadine (ALLEGRA) 180 MG tablet Take 180 mg by mouth as needed.       . fish oil-omega-3 fatty acids 1000 MG capsule Take 2 g by mouth daily.        . fluticasone (FLONASE) 50 MCG/ACT nasal spray Place 2 sprays into the nose daily.      . Fluticasone-Salmeterol (ADVAIR) 100-50 MCG/DOSE AEPB Inhale 1 puff into the lungs every 12 (twelve) hours.      . furosemide (LASIX) 20 MG tablet Take 1 tablet (20 mg total) by mouth daily.  30 tablet  1  . isosorbide mononitrate (IMDUR) 30 MG 24 hr tablet Take 1 tablet (30 mg total) by mouth daily.  90 tablet  0  . KP OMEPRAZOLE MAGNESIUM PO Take 12.6 mg by mouth daily.      . metolazone (ZAROXOLYN) 2.5 MG tablet 1 tab  Every other day  30 tablet  6  . metoprolol succinate (TOPROL-XL) 50 MG 24 hr tablet Take 1 tablet (50 mg total) by mouth daily.  90 tablet  3  . PARoxetine (PAXIL) 30 MG tablet Take 30 mg by mouth every morning.        . potassium chloride SA (K-DUR,KLOR-CON) 20 MEQ tablet Take 1 tablet (20 mEq  total) by mouth daily.  90 tablet  2  . ramipril (ALTACE) 10 MG capsule Take 1 capsule (10 mg total) by mouth daily.  30 capsule  12  . [DISCONTINUED] Budesonide (PULMICORT IN) Inhale into the lungs as directed.        . [DISCONTINUED] omeprazole (PRILOSEC) 10 MG capsule Take 10 mg by mouth daily.         No current facility-administered medications for this visit.    Allergies  Statins  Electrocardiogram:  SR rate 81 LAE LAD poor R wave progression  Assessment and Plan

## 2013-06-05 NOTE — Assessment & Plan Note (Signed)
Counseled for less than 10 minutes Has cut back to 10/day  No motivation to totally quit

## 2013-06-05 NOTE — Patient Instructions (Addendum)
Your physician wants you to follow-up in:   [place] will receive a reminder letter in the mail two months in advance. If you don't receive a letter, please call our office to schedule the follow-up appointment. Your physician has requested that you have a lower or upper extremity arterial duplex. This test is an ultrasound of the arteries in the legs or arms. It looks at arterial blood flow in the legs and arms. Allow one hour for Lower and Upper Arterial scans. There are no restrictions or special instructions  LOWER WITH  ABIS Your physician recommends that you return for lab work in: [place] AS  ABI

## 2013-06-05 NOTE — Assessment & Plan Note (Signed)
Stable with no angina and good activity level.  Continue medical Rx  

## 2013-06-05 NOTE — Assessment & Plan Note (Signed)
Bilateral worst in left some features of claudication. Pulses seem ok  F/U ABI's

## 2013-06-05 NOTE — Assessment & Plan Note (Signed)
No discharges f/u EP clinic

## 2013-06-08 ENCOUNTER — Other Ambulatory Visit (INDEPENDENT_AMBULATORY_CARE_PROVIDER_SITE_OTHER): Payer: BC Managed Care – PPO

## 2013-06-08 ENCOUNTER — Encounter (INDEPENDENT_AMBULATORY_CARE_PROVIDER_SITE_OTHER): Payer: BC Managed Care – PPO

## 2013-06-08 DIAGNOSIS — M79605 Pain in left leg: Secondary | ICD-10-CM

## 2013-06-08 DIAGNOSIS — I2510 Atherosclerotic heart disease of native coronary artery without angina pectoris: Secondary | ICD-10-CM

## 2013-06-08 DIAGNOSIS — I70219 Atherosclerosis of native arteries of extremities with intermittent claudication, unspecified extremity: Secondary | ICD-10-CM

## 2013-06-08 LAB — LIPID PANEL
Cholesterol: 258 mg/dL — ABNORMAL HIGH (ref 0–200)
Total CHOL/HDL Ratio: 5
VLDL: 22 mg/dL (ref 0.0–40.0)

## 2013-06-08 LAB — LDL CHOLESTEROL, DIRECT: Direct LDL: 184.3 mg/dL

## 2013-06-14 ENCOUNTER — Other Ambulatory Visit: Payer: Self-pay | Admitting: *Deleted

## 2013-06-14 MED ORDER — FUROSEMIDE 20 MG PO TABS: 20.0000 mg | ORAL_TABLET | Freq: Every day | ORAL | Status: DC

## 2013-06-14 NOTE — Telephone Encounter (Signed)
Fax Received. Refill Completed. Mahmood Boehringer Chowoe (R.M.A)  Pt called to get refills

## 2013-06-22 ENCOUNTER — Ambulatory Visit (INDEPENDENT_AMBULATORY_CARE_PROVIDER_SITE_OTHER): Payer: BC Managed Care – PPO | Admitting: Pharmacist

## 2013-06-22 VITALS — Wt 127.0 lb

## 2013-06-22 DIAGNOSIS — E785 Hyperlipidemia, unspecified: Secondary | ICD-10-CM

## 2013-06-22 MED ORDER — ROSUVASTATIN CALCIUM 5 MG PO TABS: ORAL_TABLET | ORAL | Status: DC

## 2013-06-22 NOTE — Assessment & Plan Note (Signed)
Pt's cholesterol above goal while not on therapy.  TC- 258, TG-110, HDL- 49, LDL- 184 (goal<70).  Pt tolerated Lipitor 20mg  with no problems- only had issues once dose was increased.  Likely dosing issue rather than absolute statin intolerance.  Will start on low-dose Crestor 3 days of the week to see if she can tolerate that.  If so, will try to titrate to get LDL to goal.  May need to add Zetia for maximum results.  Will follow up in 2 months.

## 2013-06-22 NOTE — Patient Instructions (Signed)
Start Crestor 5mg  on Monday, Wednesday and Friday  Try Miralax for constipation  Try to start walking 2-3 days a week for 15-20 minutes   Recheck labs 2 months

## 2013-06-22 NOTE — Progress Notes (Signed)
HPI  [name] is a 62 yo pt referred by Dr. Johnsie Cancel for statin intolerance.  She has a history of ischemic cardiomyopathy and stents to LAD and RCA.  Has history of elevated cholesterol.  Was first placed on Lipitor 20mg  and tolerated with no problems until dose was increased to 40mg  daily.  She then had problems with muscle weakness and lack of energy.  She was switched to Crestor (unsure of dose) with same effects and most recently switched back to Lipitor 40mg  with no success.  Most recent blood work is while pt on no cholesterol medications.  She has not tried any other therapy.    Reviewed pt's diet.  She is trying to make improvements.  She has changed from white bread to honey wheat.  Her only complaint is increased constipation with this change.  She tries to broil meats rather than fry.  Likes to saute vegetables in canola oil and eat salads.  She has decreased her sugar intake by changing to 1/2 and 1/2 sweet/unsweet tea.  She does crave candy but limits how much she eats.   Pt is currently not exercising.  She had a habit of walking with her daughter on a regular basis but her daughter moved and so she stopped walking.  She does have a treadmill in her home as well.  She does like to garden and work in the yard.  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   Current Outpatient Prescriptions on File Prior to Visit  Medication Sig Dispense Refill  . aspirin 81 MG tablet Take 81 mg by mouth daily.        . Cyanocobalamin (VITAMIN B-12 CR PO) Take by mouth as directed.        . fexofenadine (ALLEGRA) 180 MG tablet Take 180 mg by mouth as needed.       . fish oil-omega-3 fatty acids 1000 MG capsule Take 2 g by mouth daily.        . fluticasone (FLONASE) 50 MCG/ACT nasal spray Place 2 sprays into the nose daily.      . Fluticasone-Salmeterol (ADVAIR) 100-50 MCG/DOSE AEPB Inhale 1 puff into the lungs every 12 (twelve) hours.      . furosemide (LASIX) 20 MG tablet Take 1  tablet (20 mg total) by mouth daily.  90 tablet  3  . isosorbide mononitrate (IMDUR) 30 MG 24 hr tablet Take 1 tablet (30 mg total) by mouth daily.  90 tablet  0  . KP OMEPRAZOLE MAGNESIUM PO Take 12.6 mg by mouth daily.      . metolazone (ZAROXOLYN) 2.5 MG tablet 1 tab  Every other day  30 tablet  6  . metoprolol succinate (TOPROL-XL) 50 MG 24 hr tablet Take 1 tablet (50 mg total) by mouth daily.  90 tablet  3  . PARoxetine (PAXIL) 30 MG tablet Take 30 mg by mouth every morning.        . potassium chloride SA (K-DUR,KLOR-CON) 20 MEQ tablet Take 1 tablet (20 mEq total) by mouth daily.  90 tablet  2  . ramipril (ALTACE) 10 MG capsule Take 1 capsule (10 mg total) by mouth daily.  30 capsule  12  . [DISCONTINUED] Budesonide (PULMICORT IN) Inhale into the lungs as directed.        . [DISCONTINUED] omeprazole (PRILOSEC) 10 MG capsule Take 10 mg by mouth daily.         No current facility-administered medications on  file prior to visit.   Allergies  Allergen Reactions  . Statins Other (See Comments)    Gradually tired.

## 2013-07-12 ENCOUNTER — Other Ambulatory Visit: Payer: Self-pay | Admitting: *Deleted

## 2013-07-12 DIAGNOSIS — I5023 Acute on chronic systolic (congestive) heart failure: Secondary | ICD-10-CM

## 2013-07-12 DIAGNOSIS — F17200 Nicotine dependence, unspecified, uncomplicated: Secondary | ICD-10-CM

## 2013-07-12 DIAGNOSIS — I2589 Other forms of chronic ischemic heart disease: Secondary | ICD-10-CM

## 2013-07-12 DIAGNOSIS — I428 Other cardiomyopathies: Secondary | ICD-10-CM

## 2013-07-12 DIAGNOSIS — I10 Essential (primary) hypertension: Secondary | ICD-10-CM

## 2013-07-12 DIAGNOSIS — I509 Heart failure, unspecified: Secondary | ICD-10-CM

## 2013-07-12 MED ORDER — METOPROLOL SUCCINATE ER 50 MG PO TB24: 50.0000 mg | ORAL_TABLET | Freq: Every day | ORAL | Status: DC

## 2013-08-25 ENCOUNTER — Ambulatory Visit: Payer: No Typology Code available for payment source | Admitting: *Deleted

## 2013-08-28 ENCOUNTER — Other Ambulatory Visit (INDEPENDENT_AMBULATORY_CARE_PROVIDER_SITE_OTHER): Payer: BC Managed Care – PPO

## 2013-08-28 DIAGNOSIS — E785 Hyperlipidemia, unspecified: Secondary | ICD-10-CM

## 2013-08-28 LAB — HEPATIC FUNCTION PANEL
ALT: 29 U/L (ref 0–35)
AST: 41 U/L — ABNORMAL HIGH (ref 0–37)
Albumin: 3.2 g/dL — ABNORMAL LOW (ref 3.5–5.2)
Alkaline Phosphatase: 106 U/L (ref 39–117)

## 2013-08-28 LAB — LIPID PANEL
Total CHOL/HDL Ratio: 4
Triglycerides: 77 mg/dL (ref 0.0–149.0)

## 2013-08-29 ENCOUNTER — Other Ambulatory Visit: Payer: Self-pay | Admitting: *Deleted

## 2013-08-29 DIAGNOSIS — R748 Abnormal levels of other serum enzymes: Secondary | ICD-10-CM

## 2013-08-30 ENCOUNTER — Telehealth: Payer: Self-pay | Admitting: Cardiovascular Disease

## 2013-08-30 NOTE — Telephone Encounter (Signed)
New Problem  Pt states that she was called and advised to stop taking the Crest or until December... wanted to know if she should cancel the appt with sally or come in.. Please call back to confirm.

## 2013-08-30 NOTE — Telephone Encounter (Signed)
Called patient and advised her to keep appointment with Gay Filler tomorrow to review labs and discuss diet and activity.

## 2013-08-31 ENCOUNTER — Ambulatory Visit (INDEPENDENT_AMBULATORY_CARE_PROVIDER_SITE_OTHER): Payer: BC Managed Care – PPO | Admitting: Pharmacist

## 2013-08-31 DIAGNOSIS — E785 Hyperlipidemia, unspecified: Secondary | ICD-10-CM

## 2013-08-31 NOTE — Progress Notes (Signed)
HPI  [name] is a 62 yo pt referred by Dr. Johnsie Cancel for statin intolerance.  She has a history of ischemic cardiomyopathy and stents to LAD and RCA.  Has history of elevated cholesterol.  Was first placed on Lipitor 20mg  and tolerated with no problems until dose was increased to 40mg  daily.  She then had problems with muscle weakness and lack of energy.  She was switched to Crestor (unsure of dose) with same effects and most recently switched back to Lipitor 40mg  with no success. At last visit, we restarted Crestor at 5mg  on Monday, Wednesday and Friday. She has tolerated this with no issues other than slight muscle pains.  She did state she has had more lower extremity edema in the past 2 months but reports placing a type of salt on most foods she eats.    Reviewed pt's diet.  She is trying to make more improvements.  She has increased her fiber through fiber bars and fruit and vegetables.    Pt is currently not exercising.  She had a habit of walking with her daughter on a regular basis but her daughter moved and so she stopped walking.  She does have a elipical in her home as well.  She babysits her 2 grandchidren (age 48 and 3) 1 day of the week.   Current Outpatient Prescriptions on File Prior to Visit  Medication Sig Dispense Refill  . aspirin 81 MG tablet Take 81 mg by mouth daily.        . Cyanocobalamin (VITAMIN B-12 CR PO) Take by mouth as directed.        . fexofenadine (ALLEGRA) 180 MG tablet Take 180 mg by mouth as needed.       . fish oil-omega-3 fatty acids 1000 MG capsule Take 2 g by mouth daily.        . fluticasone (FLONASE) 50 MCG/ACT nasal spray Place 2 sprays into the nose daily.      . Fluticasone-Salmeterol (ADVAIR) 100-50 MCG/DOSE AEPB Inhale 1 puff into the lungs every 12 (twelve) hours.      . furosemide (LASIX) 20 MG tablet Take 1 tablet (20 mg total) by mouth daily.  90 tablet  3  . isosorbide mononitrate (IMDUR) 30 MG 24 hr tablet Take 1 tablet (30 mg total) by mouth  daily.  90 tablet  0  . KP OMEPRAZOLE MAGNESIUM PO Take 12.6 mg by mouth daily.      . metolazone (ZAROXOLYN) 2.5 MG tablet 1 tab  Every other day  30 tablet  6  . metoprolol succinate (TOPROL-XL) 50 MG 24 hr tablet Take 1 tablet (50 mg total) by mouth daily.  90 tablet  3  . PARoxetine (PAXIL) 30 MG tablet Take 30 mg by mouth every morning.        . potassium chloride SA (K-DUR,KLOR-CON) 20 MEQ tablet Take 1 tablet (20 mEq total) by mouth daily.  90 tablet  2  . ramipril (ALTACE) 10 MG capsule Take 1 capsule (10 mg total) by mouth daily.  30 capsule  12  . rosuvastatin (CRESTOR) 5 MG tablet Take 5mg  on Monday, Wednesday and Friday  28 tablet  0  . [DISCONTINUED] Budesonide (PULMICORT IN) Inhale into the lungs as directed.        . [DISCONTINUED] omeprazole (PRILOSEC) 10 MG capsule Take 10 mg by mouth daily.         No current facility-administered medications on file prior to visit.   Allergies  Allergen Reactions  .  Statins Other (See Comments)    Gradually tired.

## 2013-08-31 NOTE — Assessment & Plan Note (Signed)
Pt's cholesterol much improved with Crestor 3 days of the week.  LDL improved from 187 to 60.  AST slightly elevated at 41. No baseline data for comparison Will continue therapy and recheck labs in 3 months.  If AST continues to be elevated, may need to start alternative therapy.

## 2013-08-31 NOTE — Patient Instructions (Addendum)
Continue Crestor 5mg  on Monday, Wednesday and Friday.  Please call if you have any problems.   Continue to work on [employer] and exercise.   Recheck labs in January.

## 2013-09-06 ENCOUNTER — Ambulatory Visit (INDEPENDENT_AMBULATORY_CARE_PROVIDER_SITE_OTHER)
Admission: RE | Admit: 2013-09-06 | Discharge: 2013-09-06 | Disposition: A | Discharge status: Home / Self Care | Payer: BC Managed Care – PPO | Source: Ambulatory Visit | Attending: Physician Assistant | Admitting: Physician Assistant

## 2013-09-06 ENCOUNTER — Telehealth: Payer: Self-pay | Admitting: *Deleted

## 2013-09-06 ENCOUNTER — Ambulatory Visit (INDEPENDENT_AMBULATORY_CARE_PROVIDER_SITE_OTHER): Payer: BC Managed Care – PPO | Admitting: Physician Assistant

## 2013-09-06 ENCOUNTER — Encounter: Payer: Self-pay | Admitting: Physician Assistant

## 2013-09-06 ENCOUNTER — Other Ambulatory Visit (INDEPENDENT_AMBULATORY_CARE_PROVIDER_SITE_OTHER): Payer: BC Managed Care – PPO

## 2013-09-06 VITALS — BP 100/72 | HR 83 | Ht 63.5 in | Wt 131.0 lb

## 2013-09-06 DIAGNOSIS — I2589 Other forms of chronic ischemic heart disease: Secondary | ICD-10-CM

## 2013-09-06 DIAGNOSIS — I5023 Acute on chronic systolic (congestive) heart failure: Secondary | ICD-10-CM

## 2013-09-06 DIAGNOSIS — I2510 Atherosclerotic heart disease of native coronary artery without angina pectoris: Secondary | ICD-10-CM

## 2013-09-06 DIAGNOSIS — F17200 Nicotine dependence, unspecified, uncomplicated: Secondary | ICD-10-CM

## 2013-09-06 DIAGNOSIS — E785 Hyperlipidemia, unspecified: Secondary | ICD-10-CM

## 2013-09-06 DIAGNOSIS — I10 Essential (primary) hypertension: Secondary | ICD-10-CM

## 2013-09-06 LAB — BASIC METABOLIC PANEL
BUN: 11 mg/dL (ref 6–23)
CO2: 33 mEq/L — ABNORMAL HIGH (ref 19–32)
Calcium: 9.1 mg/dL (ref 8.4–10.5)
GFR: 61.34 mL/min (ref >60.00)
Glucose, Bld: 100 mg/dL — ABNORMAL HIGH (ref 70–99)
Potassium: 3.4 mEq/L — ABNORMAL LOW (ref 3.5–5.1)
Sodium: 135 mEq/L (ref 135–145)

## 2013-09-06 LAB — CBC WITH DIFFERENTIAL/PLATELET
Basophils Absolute: 0 10*3/uL (ref 0.0–0.1)
Eosinophils Absolute: 0 10*3/uL (ref 0.0–0.7)
HCT: 36.3 % (ref 36.0–46.0)
Hemoglobin: 11.8 g/dL — ABNORMAL LOW (ref 12.0–15.0)
Lymphocytes Relative: 13 % (ref 12.0–46.0)
Lymphs Abs: 0.9 10*3/uL (ref 0.7–4.0)
MCHC: 32.4 g/dL (ref 30.0–36.0)
Neutro Abs: 5.3 10*3/uL (ref 1.4–7.7)
Platelets: 149 10*3/uL — ABNORMAL LOW (ref 150.0–400.0)
RDW: 17.1 % — ABNORMAL HIGH (ref 11.5–14.6)

## 2013-09-06 MED ORDER — FUROSEMIDE 40 MG PO TABS: 40.0000 mg | ORAL_TABLET | Freq: Two times a day (BID) | ORAL | Status: DC

## 2013-09-06 MED ORDER — METOLAZONE 2.5 MG PO TABS: ORAL_TABLET | ORAL | Status: DC

## 2013-09-06 MED ORDER — POTASSIUM CHLORIDE CRYS ER 20 MEQ PO TBCR: 60.0000 meq | EXTENDED_RELEASE_TABLET | Freq: Every day | ORAL | Status: DC

## 2013-09-06 NOTE — Telephone Encounter (Signed)
lmptcb for results

## 2013-09-06 NOTE — Patient Instructions (Addendum)
INCREASE LASIX TO 40 MG TWICE DAILY, NEW RX WAS SENT IN TODAY  INCREASE POTASSIUM 20 MEQ THREE TIMES DAILY, NEW RX SENT IN TODAY  LABS TODAY WITH CHEST X-RAY AT [place]; LAB IS IN THE BASEMENT; BMET, CBC W/DIFF, BNP TODAY AND CXR  YOU HAVE A FOLLOW UP APPT WITH SCOTT WEAVER, [facility] 09/11/13 @ 3 PM YOU WILL HAVE REPEAT BMET SAME DAY AS PA APPT.  TAKE ZAROXOLYN 2.5 MG 30 MINUTES BEFORE MORNING LASIX TOMORROW ONLY 09/07/13 WITH THIS YOU WILL NEED TO TAKE AN EXTRA POTASSIUM 09/07/13 WHICH WILL TOTAL YOUR POTASSIUM DOSE 09/07/13 TO 80 MEQ.  PLEASE SCHEDULE TO HAVE AN ECHO ; DX 414.01, 428.23, 414.8

## 2013-09-06 NOTE — Progress Notes (Signed)
[facility]. [address], [place]  [address] Phone: [phone] Fax:  [phone]  Date:  09/06/2013   ID:  HOUSTON BRIDEN, DOB [birth year], MRN [place]:[id number]  PCP:  Ailene Ards, RN  Cardiologist:  Dr. Jenkins Rouge  Electrophysiologist:  Dr. Thompson Grayer    History of Present Illness: [name] is a 62 y.o. female who returns for evaluation of dyspnea and weight gain.   She has a hx of CAD, s/p prior Ant MI, s/p prior POBA to LAD, Dx, RCA, ICM with EF 123456 by MRI, systolic CHF, s/p AICD, HTN, HL, depression, tobacco abuse.  LHC (10/12):  dLAD 40, pCFX 30, OM1 50, pRCA 30.  MRI (12/09):  High scar burden, Inf and Apical AK, EF 24%.  Last seen by Dr. Jenkins Rouge 05/2013.  Over the last 1-2 weeks, she notes worsening DOE.  She notes dyspnea with just minimal activity such as getting dressed.  She is NYHA Class IIIb.  She notes 2 pillow orthopnea and ?PND.  LE edema is increased and her weight at home is up 10 lbs.  She took zaroxolyn 2-3 x last week without much benefit.  She notes some chest pressure.  No radiating symptoms.  She denies syncope.  She has felt near syncopal with dyspnea.    Labs (10/12):  K 3.2, creatinine 0.9, Hgb 13.5 Labs (9/14):    HDL 25, LDL 60, ALT 29  Wt Readings from Last 3 Encounters:  09/06/13 131 lb (59.421 kg)  06/22/13 127 lb (57.607 kg)  06/05/13 129 lb 6.4 oz (58.695 kg)     Past Medical History  Diagnosis Date  . Ischemic cardiomyopathy     MRI (12/09):  High scar burden, Inf and Apical AK, EF 24%.  . Chronic systolic heart failure   . HTN (hypertension)   . Depression   . MI (myocardial infarction)   . ICD (implantable cardiac defibrillator) in place   . Asthma   . H/O hiatal hernia   . Arthritis     hands  . CAD (coronary artery disease)     a. s/p prior Ant MI, b. s/p prior POBA to LAD, Dx, RCA,;  c.  LHC (10/12):  dLAD 40, pCFX 30, OM1 50, pRCA 30.   Marland Kitchen HLD (hyperlipidemia)   . Tobacco abuse     Current Outpatient  Prescriptions  Medication Sig Dispense Refill  . aspirin 81 MG tablet Take 81 mg by mouth daily.        . Cyanocobalamin (VITAMIN B-12 CR PO) Take by mouth as directed.        Marland Kitchen dexlansoprazole (DEXILANT) 60 MG capsule Take 60 mg by mouth daily.      . fexofenadine (ALLEGRA) 180 MG tablet Take 180 mg by mouth as needed.       . fish oil-omega-3 fatty acids 1000 MG capsule Take 2 g by mouth daily.        . fluticasone (FLONASE) 50 MCG/ACT nasal spray Place 2 sprays into the nose daily.      . Fluticasone-Salmeterol (ADVAIR) 100-50 MCG/DOSE AEPB Inhale 1 puff into the lungs every 12 (twelve) hours.      . furosemide (LASIX) 40 MG tablet Take 1 tablet (40 mg total) by mouth 2 (two) times daily.  60 tablet  11  . isosorbide mononitrate (IMDUR) 30 MG 24 hr tablet Take 1 tablet (30 mg total) by mouth daily.  90 tablet  0  . metolazone (ZAROXOLYN) 2.5  MG tablet TAKE 2.5 MG 09/07/13 ONLY; TAKE 30 MINUTES BEFORE LASIX      . metoprolol succinate (TOPROL-XL) 50 MG 24 hr tablet Take 1 tablet (50 mg total) by mouth daily.  90 tablet  3  . PARoxetine (PAXIL) 30 MG tablet Take 30 mg by mouth every morning.        . potassium chloride SA (K-DUR,KLOR-CON) 20 MEQ tablet Take 3 tablets (60 mEq total) by mouth daily.  90 tablet  11  . ramipril (ALTACE) 10 MG capsule Take 1 capsule (10 mg total) by mouth daily.  30 capsule  12  . rosuvastatin (CRESTOR) 5 MG tablet Take 5mg  on Monday, Wednesday and Friday  28 tablet  0  . [DISCONTINUED] Budesonide (PULMICORT IN) Inhale into the lungs as directed.        . [DISCONTINUED] omeprazole (PRILOSEC) 10 MG capsule Take 10 mg by mouth daily.         No current facility-administered medications for this visit.    Allergies:    Allergies  Allergen Reactions  . Statins Other (See Comments)    Gradually tired.    Social History:  The patient  reports that she has been smoking Cigarettes.  She has been smoking about 0.00 packs per day. She has never used smokeless tobacco.  She reports that she does not drink alcohol or use illicit drugs.   ROS:  Please see the history of present illness.   She has a cough with clear sputum.  She has occasional dark stools and hemorrhoidal bleeding.  No fever.   All other systems reviewed and negative.   PHYSICAL EXAM: VS:  BP 100/72  Pulse 83  Ht 5' 3.5" (1.613 m)  Wt 131 lb (59.421 kg)  BMI 22.84 kg/m2  SpO2 99% Well nourished, well developed, in no acute distress HEENT: normal Neck: + JVD Cardiac:  normal S1, S2; RRR; 2/6 systolic murmur LLSB Lungs:  Faint rales at the baes bilaterally, no wheezing  Abd: distended, no hepatomegaly Ext: 1+ bilateral LE edema Skin: warm and dry Neuro:  CNs 2-12 intact, no focal abnormalities noted  EKG:  NSR, HR 83, RAD (?lead placement), PRWP, NSSTTW changes      ASSESSMENT AND PLAN:  1. Acute on Chronic Systolic CHF:  She is volume overloaded.  She is up 10 lbs at home.  She did try taking extra Zaroxolyn last week without benefit.  We discussed increasing her diuresis at home with close follow up vs admission to the hospital.  She prefers to increase diuresis at home first.  I reviewed with Dr. Thompson Grayer (DOD) who agreed.  I will increase her Lasix to 40 bid, K+ 60 QD.  She will take a dose of metolazone 2.5 tomorrow only prior to her AM lasix with an extra K+ 20.  Check BMET, BNP, CBC and CXR today.  She knows to call if she feels worse or notes weight increase.  She knows to go to the ED if she feels much worse.  She will be brought back in close f/u.  If no improvement, she will require admission for IV diuresis.   2. Ischemic Cardiomyopathy:  Continue beta blocker, ACEI, Lasix.  Could consider adding Spironolactone in follow up.  She has a murmur on exam (?MR).  Arrange follow up echo. 3. Hypertension:  Controlled. 4. Hyperlipidemia:  Continue statin. 5. CAD:  She has had some chest pressure which seems to be related to volume overload.  Will diurese first.  If CP persists with  correction of volume, consider stress testing.   6. Tobacco Abuse:  She knows she needs to quit.  7. S/p AICD:  F/u with EP as directed.  8. Disposition:  F/u with me on Monday, 10/6 at 3pm.  Signed, Richardson Dopp, PA-C  09/06/2013 11:13 AM

## 2013-09-07 LAB — BRAIN NATRIURETIC PEPTIDE: Pro B Natriuretic peptide (BNP): 1560 pg/mL — ABNORMAL HIGH (ref 0.0–100.0)

## 2013-09-07 NOTE — Telephone Encounter (Signed)
s/w pt about cxr results. Pt states she is still not feeling better, states feels a little dizzy today but not like she will pass out. I did verify with the pt she took the zaroxolyn and K+ today 30 minutes before AM lasix today;pt states yes; which then she then took her new increased dose of lasix 40 mg along with her K+ from yesterday's visit. I asked if she had any water yet today and states no. I advised pt caffeine products will dehydrate her. I advised pt to go ahead a drink a glass of water and to rest and that I will d/w Nicki Reaper W. PA and try to get back today to her with any recommendations the PA may have. I also asked pt if she weighs daily and she said yes. Pt states wt today was 134.6; in the office yesterday wt was 131; pt then states he wt last night was 134 but this is on her scale at home. I asked pt if she weighed before her appt yesterday and she said no. She thinks her scale may be off from what our office scale said yesterday.. I did advise pt though I will still d/w PA and let he know what he said. Pt said thank you for our help. I said she was welcomed.

## 2013-09-08 ENCOUNTER — Telehealth: Payer: Self-pay | Admitting: *Deleted

## 2013-09-08 NOTE — Telephone Encounter (Signed)
lmptcb x 2. I lm pt to take extra K+ 20 meq today and we will see her at her appt 10/6 with Brynda Rim. PA @ 3 pm. I will try one more time to reach pt today before I leave today at 6 pm

## 2013-09-08 NOTE — Telephone Encounter (Signed)
lmptcb for lab results with med change

## 2013-09-11 ENCOUNTER — Encounter: Payer: Self-pay | Admitting: Physician Assistant

## 2013-09-11 ENCOUNTER — Ambulatory Visit (INDEPENDENT_AMBULATORY_CARE_PROVIDER_SITE_OTHER): Payer: BC Managed Care – PPO | Admitting: Physician Assistant

## 2013-09-11 ENCOUNTER — Other Ambulatory Visit: Payer: No Typology Code available for payment source

## 2013-09-11 VITALS — BP 130/60 | HR 76 | Ht 63.5 in | Wt 126.0 lb

## 2013-09-11 DIAGNOSIS — I2510 Atherosclerotic heart disease of native coronary artery without angina pectoris: Secondary | ICD-10-CM

## 2013-09-11 DIAGNOSIS — I10 Essential (primary) hypertension: Secondary | ICD-10-CM

## 2013-09-11 DIAGNOSIS — E785 Hyperlipidemia, unspecified: Secondary | ICD-10-CM

## 2013-09-11 DIAGNOSIS — I2589 Other forms of chronic ischemic heart disease: Secondary | ICD-10-CM

## 2013-09-11 DIAGNOSIS — F17200 Nicotine dependence, unspecified, uncomplicated: Secondary | ICD-10-CM

## 2013-09-11 DIAGNOSIS — E876 Hypokalemia: Secondary | ICD-10-CM

## 2013-09-11 DIAGNOSIS — I5023 Acute on chronic systolic (congestive) heart failure: Secondary | ICD-10-CM

## 2013-09-11 MED ORDER — POTASSIUM CHLORIDE CRYS ER 20 MEQ PO TBCR: 40.0000 meq | EXTENDED_RELEASE_TABLET | Freq: Every day | ORAL | Status: DC

## 2013-09-11 MED ORDER — FUROSEMIDE 40 MG PO TABS: 60.0000 mg | ORAL_TABLET | Freq: Two times a day (BID) | ORAL | Status: DC

## 2013-09-11 NOTE — Progress Notes (Signed)
[facility]. [address], [place]  [address] Phone: [phone] Fax:  [phone]  Date:  09/11/2013   ID:  [name], DOB 11/27/1951, MRN [place]:[id number]  PCP:  Ailene Ards, RN  Cardiologist:  Dr. Jenkins Rouge  Electrophysiologist:  Dr. Thompson Grayer    History of Present Illness: [name] is a 62 y.o. female who returns for evaluation of dyspnea and weight gain.   She has a hx of CAD, s/p prior Ant MI, s/p prior POBA to LAD, Dx, RCA, ICM with EF 123456 by MRI, systolic CHF, s/p AICD, HTN, HL, depression, tobacco abuse.  LHC (10/12):  dLAD 40, pCFX 30, OM1 50, pRCA 30.  MRI (12/09):  High scar burden, Inf and Apical AK, EF 24%.  I saw her 09/06/13 with complaints of increased dyspnea, edema, orthopnea and weight gain. I adjusted her diuretics. She was brought back today for close follow up.  Chest x-ray did demonstrate small right pleural effusion.  Her breathing is improved. She probably describes NYHA class IIb-3 symptoms. She continues to sleep on 2 pillows. She denies PND. Her LE edema is improved. She continues to have some chest heaviness with activity. This is also improved. She denies any radiating symptoms. She denies syncope. She has occasional nausea as well as dizziness.  Labs (10/12):  K 3.2, creatinine 0.9, Hgb 13.5 Labs (9/14):    K 3.4, creatinine 1.0, pro BNP 1560, Hgb 11.8, HDL 25, LDL 60, ALT 29  Wt Readings from Last 3 Encounters:  09/11/13 126 lb (57.153 kg)  09/06/13 131 lb (59.421 kg)  06/22/13 127 lb (57.607 kg)     Past Medical History  Diagnosis Date  . Ischemic cardiomyopathy     MRI (12/09):  High scar burden, Inf and Apical AK, EF 24%.  . Chronic systolic heart failure   . HTN (hypertension)   . Depression   . MI (myocardial infarction)   . ICD (implantable cardiac defibrillator) in place   . Asthma   . H/O hiatal hernia   . Arthritis     hands  . CAD (coronary artery disease)     a. s/p prior Ant MI, b. s/p prior POBA to  LAD, Dx, RCA,;  c.  LHC (10/12):  dLAD 40, pCFX 30, OM1 50, pRCA 30.   Marland Kitchen HLD (hyperlipidemia)   . Tobacco abuse     Current Outpatient Prescriptions  Medication Sig Dispense Refill  . aspirin 81 MG tablet Take 81 mg by mouth daily.        . Cyanocobalamin (VITAMIN B-12 CR PO) Take by mouth as directed.        Marland Kitchen dexlansoprazole (DEXILANT) 60 MG capsule Take 60 mg by mouth daily.      . fexofenadine (ALLEGRA) 180 MG tablet Take 180 mg by mouth as needed.       . fish oil-omega-3 fatty acids 1000 MG capsule Take 2 g by mouth daily.        . fluticasone (FLONASE) 50 MCG/ACT nasal spray Place 2 sprays into the nose daily.      . Fluticasone-Salmeterol (ADVAIR) 100-50 MCG/DOSE AEPB Inhale 1 puff into the lungs every 12 (twelve) hours.      . furosemide (LASIX) 40 MG tablet Take 1 tablet (40 mg total) by mouth 2 (two) times daily.  60 tablet  11  . isosorbide mononitrate (IMDUR) 30 MG 24 hr tablet Take 1 tablet (30 mg total) by mouth daily.  90 tablet  0  . metolazone (ZAROXOLYN) 2.5 MG tablet TAKE 2.5 MG 09/07/13 ONLY; TAKE 30 MINUTES BEFORE LASIX      . metoprolol succinate (TOPROL-XL) 50 MG 24 hr tablet Take 1 tablet (50 mg total) by mouth daily.  90 tablet  3  . PARoxetine (PAXIL) 30 MG tablet Take 30 mg by mouth every morning.        . potassium chloride SA (K-DUR,KLOR-CON) 20 MEQ tablet Take 3 tablets (60 mEq total) by mouth daily.  90 tablet  11  . ramipril (ALTACE) 10 MG capsule Take 1 capsule (10 mg total) by mouth daily.  30 capsule  12  . rosuvastatin (CRESTOR) 5 MG tablet Take 5mg  on Monday, Wednesday and Friday  28 tablet  0  . [DISCONTINUED] Budesonide (PULMICORT IN) Inhale into the lungs as directed.        . [DISCONTINUED] omeprazole (PRILOSEC) 10 MG capsule Take 10 mg by mouth daily.         No current facility-administered medications for this visit.    Allergies:    Allergies  Allergen Reactions  . Statins Other (See Comments)    Gradually tired.    Social History:  The  patient  reports that she has been smoking Cigarettes.  She has been smoking about 0.00 packs per day. She has never used smokeless tobacco. She reports that she does not drink alcohol or use illicit drugs.   ROS:  Please see the history of present illness.   She has occasional nonproductive cough.  All other systems reviewed and negative.   PHYSICAL EXAM: VS:  BP 130/60  Pulse 76  Ht 5' 3.5" (1.613 m)  Wt 126 lb (57.153 kg)  BMI 21.97 kg/m2 Well nourished, well developed, in no acute distress HEENT: normal Neck: no JVD Cardiac:  normal S1, S2; RRR; 1/6 systolic murmur LLSB Lungs:  Decreased breath sounds bilaterally, no rales, no wheezing  Abd: soft, nontender, no hepatomegaly Ext: 1+ bilateral ankle edema Skin: warm and dry Neuro:  CNs 2-12 intact, no focal abnormalities noted  EKG:  NSR, HR 83, RAD, PRWP, NSSTTW changes      ASSESSMENT AND PLAN:  1. Acute on Chronic Systolic CHF:  Her volume is improved. Her weight is down. Her breathing is improved. I will cut back on her Lasix to 60 mg daily and potassium to 40 mEq daily. Check a follow up basic metabolic panel and BNP today.  Repeat a basic metabolic panel in one week. 2. Ischemic Cardiomyopathy:  Continue beta blocker, ACEI, Lasix.  Consider adding Spironolactone depending upon results of upcoming blood work.  Follow up echocardiogram pending. 3. Hypertension:  Controlled. 4. Hyperlipidemia:  Continue statin. 5. CAD:  She has had some chest pressure which seems to be related to volume overload.  This has improved with diuresis. If she continues to have symptoms, consider proceeding with nuclear perfusion study.   6. Tobacco Abuse:  She knows she needs to quit.  7. S/p AICD:  F/u with EP as directed.  8. Disposition:  F/u with me or Dr. Johnsie Cancel in 2 weeks.Danton Sewer, PA-C  09/11/2013 3:40 PM

## 2013-09-11 NOTE — Patient Instructions (Addendum)
DECREASE LASIX TO 60 MG DAILY  DECREASE POTASSIUM TO 40 MEQ DAILY  LABS TODAY; BMET, BNP  REPEAT BMET 09/19/13  PLEASE FOLLOW UP 09/27/13 @ 11:30 WITH SCOTT WEAVER, PAC SAME DAY DR. Johnsie Cancel IS IN THE OFFICE

## 2013-09-12 ENCOUNTER — Ambulatory Visit: Payer: BC Managed Care – PPO | Admitting: Physician Assistant

## 2013-09-12 LAB — BASIC METABOLIC PANEL
BUN: 15 mg/dL (ref 6–23)
GFR: 55.98 mL/min — ABNORMAL LOW (ref >60.00)
Potassium: 3.3 mEq/L — ABNORMAL LOW (ref 3.5–5.1)
Sodium: 135 mEq/L (ref 135–145)

## 2013-09-12 LAB — BRAIN NATRIURETIC PEPTIDE: Pro B Natriuretic peptide (BNP): 1175 pg/mL — ABNORMAL HIGH (ref 0.0–100.0)

## 2013-09-13 ENCOUNTER — Telehealth: Payer: Self-pay | Admitting: *Deleted

## 2013-09-13 DIAGNOSIS — I10 Essential (primary) hypertension: Secondary | ICD-10-CM

## 2013-09-13 MED ORDER — SPIRONOLACTONE 25 MG PO TABS: 12.5000 mg | ORAL_TABLET | Freq: Every day | ORAL | Status: DC

## 2013-09-13 NOTE — Telephone Encounter (Signed)
pt notified about lab results and starting spironolactone 12.5 mg QD, bmet 09/15/13, pt verbalized understanding to Plan of Care

## 2013-09-15 ENCOUNTER — Other Ambulatory Visit: Payer: BC Managed Care – PPO

## 2013-09-20 ENCOUNTER — Ambulatory Visit (HOSPITAL_COMMUNITY): Payer: BC Managed Care – PPO | Attending: Cardiology | Admitting: Cardiology

## 2013-09-20 ENCOUNTER — Other Ambulatory Visit (INDEPENDENT_AMBULATORY_CARE_PROVIDER_SITE_OTHER): Payer: BC Managed Care – PPO | Admitting: *Deleted

## 2013-09-20 DIAGNOSIS — I509 Heart failure, unspecified: Secondary | ICD-10-CM | POA: Insufficient documentation

## 2013-09-20 DIAGNOSIS — I059 Rheumatic mitral valve disease, unspecified: Secondary | ICD-10-CM | POA: Insufficient documentation

## 2013-09-20 DIAGNOSIS — F17200 Nicotine dependence, unspecified, uncomplicated: Secondary | ICD-10-CM | POA: Insufficient documentation

## 2013-09-20 DIAGNOSIS — I10 Essential (primary) hypertension: Secondary | ICD-10-CM | POA: Insufficient documentation

## 2013-09-20 DIAGNOSIS — I2510 Atherosclerotic heart disease of native coronary artery without angina pectoris: Secondary | ICD-10-CM

## 2013-09-20 DIAGNOSIS — I5023 Acute on chronic systolic (congestive) heart failure: Secondary | ICD-10-CM

## 2013-09-20 DIAGNOSIS — I2589 Other forms of chronic ischemic heart disease: Secondary | ICD-10-CM

## 2013-09-20 DIAGNOSIS — E785 Hyperlipidemia, unspecified: Secondary | ICD-10-CM | POA: Insufficient documentation

## 2013-09-20 DIAGNOSIS — I517 Cardiomegaly: Secondary | ICD-10-CM | POA: Insufficient documentation

## 2013-09-20 LAB — BASIC METABOLIC PANEL
CO2: 29 mEq/L (ref 19–32)
Chloride: 98 mEq/L (ref 96–112)
Creatinine, Ser: 1.2 mg/dL (ref 0.4–1.2)
GFR: 49.89 mL/min — ABNORMAL LOW (ref >60.00)
Potassium: 4.2 mEq/L (ref 3.5–5.1)
Sodium: 136 mEq/L (ref 135–145)

## 2013-09-20 NOTE — Progress Notes (Signed)
Echo performed. 

## 2013-09-21 ENCOUNTER — Encounter: Payer: Self-pay | Admitting: Physician Assistant

## 2013-09-27 ENCOUNTER — Ambulatory Visit (INDEPENDENT_AMBULATORY_CARE_PROVIDER_SITE_OTHER): Payer: BC Managed Care – PPO | Admitting: Physician Assistant

## 2013-09-27 ENCOUNTER — Encounter: Payer: Self-pay | Admitting: Physician Assistant

## 2013-09-27 ENCOUNTER — Ambulatory Visit (INDEPENDENT_AMBULATORY_CARE_PROVIDER_SITE_OTHER): Payer: BC Managed Care – PPO | Admitting: *Deleted

## 2013-09-27 VITALS — BP 110/60 | HR 73 | Ht 63.5 in | Wt 121.0 lb

## 2013-09-27 DIAGNOSIS — I5022 Chronic systolic (congestive) heart failure: Secondary | ICD-10-CM

## 2013-09-27 DIAGNOSIS — F17200 Nicotine dependence, unspecified, uncomplicated: Secondary | ICD-10-CM

## 2013-09-27 DIAGNOSIS — I2510 Atherosclerotic heart disease of native coronary artery without angina pectoris: Secondary | ICD-10-CM

## 2013-09-27 DIAGNOSIS — I509 Heart failure, unspecified: Secondary | ICD-10-CM

## 2013-09-27 DIAGNOSIS — Z95810 Presence of automatic (implantable) cardiac defibrillator: Secondary | ICD-10-CM

## 2013-09-27 DIAGNOSIS — I10 Essential (primary) hypertension: Secondary | ICD-10-CM

## 2013-09-27 DIAGNOSIS — E785 Hyperlipidemia, unspecified: Secondary | ICD-10-CM

## 2013-09-27 DIAGNOSIS — I2589 Other forms of chronic ischemic heart disease: Secondary | ICD-10-CM

## 2013-09-27 DIAGNOSIS — R079 Chest pain, unspecified: Secondary | ICD-10-CM

## 2013-09-27 LAB — ICD DEVICE OBSERVATION
CHARGE TIME: 9.599 s
DEV-0020ICD: NEGATIVE
FVT: 0
HV IMPEDENCE: 68 Ohm
PACEART VT: 0
RV LEAD AMPLITUDE: 11.8426 mv
RV LEAD IMPEDENCE ICD: 472 Ohm
RV LEAD THRESHOLD: 1 V
TOT-0001: 1
TOT-0002: 0
TOT-0006: 20100212000000
TZAT-0001FASTVT: 1
TZAT-0001SLOWVT: 1
TZAT-0001SLOWVT: 2
TZAT-0001SLOWVT: 3
TZAT-0002FASTVT: NEGATIVE
TZAT-0004SLOWVT: 3
TZAT-0004SLOWVT: 8
TZAT-0004SLOWVT: 8
TZAT-0005SLOWVT: 75 pct
TZAT-0005SLOWVT: 88 pct
TZAT-0005SLOWVT: 91 pct
TZAT-0011SLOWVT: 10 ms
TZAT-0012SLOWVT: 200 ms
TZAT-0012SLOWVT: 200 ms
TZAT-0012SLOWVT: 200 ms
TZAT-0013SLOWVT: 1
TZAT-0013SLOWVT: 1
TZAT-0013SLOWVT: 1
TZAT-0018SLOWVT: NEGATIVE
TZAT-0018SLOWVT: NEGATIVE
TZAT-0018SLOWVT: NEGATIVE
TZAT-0019SLOWVT: 8 V
TZAT-0020SLOWVT: 1.5 ms
TZAT-0020SLOWVT: 1.5 ms
TZAT-0020SLOWVT: 1.5 ms
TZON-0003SLOWVT: 400 ms
TZON-0003VSLOWVT: 450 ms
TZON-0004SLOWVT: 12
TZON-0004VSLOWVT: 20
TZON-0005SLOWVT: 12
TZST-0001FASTVT: 2
TZST-0001FASTVT: 3
TZST-0001FASTVT: 4
TZST-0001SLOWVT: 4
TZST-0001SLOWVT: 5
TZST-0001SLOWVT: 6
TZST-0002FASTVT: NEGATIVE
TZST-0002FASTVT: NEGATIVE
TZST-0002FASTVT: NEGATIVE
TZST-0003SLOWVT: 35 J
VF: 0

## 2013-09-27 LAB — BASIC METABOLIC PANEL
Calcium: 9.8 mg/dL (ref 8.4–10.5)
GFR: 45.35 mL/min — ABNORMAL LOW (ref >60.00)
Glucose, Bld: 98 mg/dL (ref 70–99)
Potassium: 5.4 mEq/L — ABNORMAL HIGH (ref 3.5–5.1)
Sodium: 139 mEq/L (ref 135–145)

## 2013-09-27 LAB — BRAIN NATRIURETIC PEPTIDE: Pro B Natriuretic peptide (BNP): 1678 pg/mL — ABNORMAL HIGH (ref 0.0–100.0)

## 2013-09-27 MED ORDER — FUROSEMIDE 40 MG PO TABS: 40.0000 mg | ORAL_TABLET | ORAL | Status: DC

## 2013-09-27 MED ORDER — CARVEDILOL 3.125 MG PO TABS: 3.1250 mg | ORAL_TABLET | Freq: Two times a day (BID) | ORAL | Status: DC

## 2013-09-27 NOTE — Patient Instructions (Addendum)
STOP TOPROL  START COREG 3.125 MG TWICE DAILY  LABS TODAY; BMET, BNP  NEED LEXISCAN  REFER TO CHF CLINIC  PLEASE FOLLOW UP WITH DR. Johnsie Cancel IN 1 MONTH  YOU WILL NEED TO FOLLOW UP WITH DR. ALLRED IN 3 MONTHS ALSO WITH DEFIB CHECK SAME DAY PER [facility] IN [place]

## 2013-09-27 NOTE — Progress Notes (Signed)
[place]. [address], [address],   [address] Phone: [phone] Fax:  [phone]  Date:  09/27/2013   ID:  [name], DOB November 22, 1951, MRN [place]:[id number]  PCP:  Ailene Ards, RN  Cardiologist:  Dr. Jenkins Rouge  Electrophysiologist:  Dr. Thompson Grayer    History of Present Illness: [name] is a 62 y.o. female who returns for f/u on CHF.   She has a hx of CAD, s/p prior Ant MI, s/p prior POBA to LAD, Dx, RCA, ICM with EF 123456 by MRI, systolic CHF, s/p AICD, HTN, HL, depression, tobacco abuse.  LHC (10/12):  dLAD 40, pCFX 30, OM1 50, pRCA 30.  MRI (12/09):  High scar burden, Inf and Apical AK, EF 24%.  I have seen her recently for a/c systolic CHF.  I adjusted her diuretics.  When last seen, her volume was somewhat improved. Followup echocardiogram did demonstrate worsened LV function with an EF of 15%. I reviewed this with Dr. Johnsie Cancel. We are in the process of referring her to the CHF clinic.    Since last seen, she notes some improvement in her dyspnea. She still describes chest heaviness with exertion. This is overall improved. She sleeps on 2 pillows chronically. She denies PND. Her LE edema is improved. She is NYHA class IIb. She does note nausea in the morning. This seems to have improved. She has seen GI in the past. She has a history of dysphagia. She is already on max PPI therapy.  Labs (10/12):  K 3.2, creatinine 0.9, Hgb 13.5 Labs (9/14):    K 3.4, creatinine 1.0, pro BNP 1560, Hgb 11.8, HDL 25, LDL 60, ALT 29 Labs (10/14):  K 3.3=> 4.2, creatinine 1.2, BNP 1175  Wt Readings from Last 3 Encounters:  09/27/13 121 lb (54.885 kg)  09/11/13 126 lb (57.153 kg)  09/06/13 131 lb (59.421 kg)     Past Medical History  Diagnosis Date  . Ischemic cardiomyopathy     MRI (12/09):  High scar burden, Inf and Apical AK, EF 24%.;  Echo (10/14): EF 15%, Gr 2 DD, mild to mod MR, mod LAE, RVSF mildly reduced, mod RAE, severe TR, PASP 49-53 (mod pulmonary HTN)  .  Chronic systolic heart failure   . HTN (hypertension)   . Depression   . MI (myocardial infarction)   . ICD (implantable cardiac defibrillator) in place   . Asthma   . H/O hiatal hernia   . Arthritis     hands  . CAD (coronary artery disease)     a. s/p prior Ant MI, b. s/p prior POBA to LAD, Dx, RCA,;  c.  LHC (10/12):  dLAD 40, pCFX 30, OM1 50, pRCA 30.   Marland Kitchen HLD (hyperlipidemia)   . Tobacco abuse     Current Outpatient Prescriptions  Medication Sig Dispense Refill  . aspirin 81 MG tablet Take 81 mg by mouth daily.        . Cyanocobalamin (VITAMIN B-12 CR PO) Take by mouth as directed.        Marland Kitchen dexlansoprazole (DEXILANT) 60 MG capsule Take 60 mg by mouth daily.      . fexofenadine (ALLEGRA) 180 MG tablet Take 180 mg by mouth as needed.       . fish oil-omega-3 fatty acids 1000 MG capsule Take 2 g by mouth daily.        . fluticasone (FLONASE) 50 MCG/ACT nasal spray Place 2 sprays into the nose daily.      Marland Kitchen  Fluticasone-Salmeterol (ADVAIR) 100-50 MCG/DOSE AEPB Inhale 1 puff into the lungs every 12 (twelve) hours.      . furosemide (LASIX) 40 MG tablet Take 1.5 tablets (60 mg total) by mouth 2 (two) times daily.      . isosorbide mononitrate (IMDUR) 30 MG 24 hr tablet Take 1 tablet (30 mg total) by mouth daily.  90 tablet  0  . metolazone (ZAROXOLYN) 2.5 MG tablet TAKE 2.5 MG 09/07/13 ONLY; TAKE 30 MINUTES BEFORE LASIX      . metoprolol succinate (TOPROL-XL) 50 MG 24 hr tablet Take 1 tablet (50 mg total) by mouth daily.  90 tablet  3  . PARoxetine (PAXIL) 30 MG tablet Take 30 mg by mouth every morning.        . potassium chloride SA (K-DUR,KLOR-CON) 20 MEQ tablet Take 2 tablets (40 mEq total) by mouth daily.      . ramipril (ALTACE) 10 MG capsule Take 1 capsule (10 mg total) by mouth daily.  30 capsule  12  . rosuvastatin (CRESTOR) 5 MG tablet Take 5mg  on Monday, Wednesday and Friday  28 tablet  0  . spironolactone (ALDACTONE) 25 MG tablet Take 0.5 tablets (12.5 mg total) by mouth daily.   30 tablet  6  . [DISCONTINUED] Budesonide (PULMICORT IN) Inhale into the lungs as directed.        . [DISCONTINUED] omeprazole (PRILOSEC) 10 MG capsule Take 10 mg by mouth daily.         No current facility-administered medications for this visit.    Allergies:    Allergies  Allergen Reactions  . Statins Other (See Comments)    Gradually tired.    Social History:  The patient  reports that she has been smoking Cigarettes.  She has been smoking about 0.00 packs per day. She has never used smokeless tobacco. She reports that she does not drink alcohol or use illicit drugs.   ROS:  Please see the history of present illness.   All other systems reviewed and negative.   PHYSICAL EXAM: VS:  BP 110/60  Pulse 73  Ht 5' 3.5" (1.613 m)  Wt 121 lb (54.885 kg)  BMI 21.1 kg/m2 Well nourished, well developed, in no acute distress HEENT: normal Neck: no JVD Cardiac:  normal S1, S2; RRR; 1/6 systolic murmur LLSB Lungs:  Decreased breath sounds bilaterally, no rales, no wheezing  Abd: soft, nontender, no hepatomegaly Ext: no edema Skin: warm and dry Neuro:  CNs 2-12 intact, no focal abnormalities noted  EKG:  NSR, HR 73, RAD, no change from prior tracing      ASSESSMENT AND PLAN:  1. Chronic Systolic CHF:  Volume is improved. Her weight is down. Symptoms are marginally improved. She was due for device check today. Her Optivol remains elevated. I will check a basic metabolic panel and BNP today. If her BNP remains elevated or has increased since last check, I will further increase her Lasix. She is being referred to CHF clinic. She likely is in need of advanced therapies. She may even benefit from right heart catheterization and inotropic therapy.  We will try to arrange an appointment with CHF as soon as possible. 2. Ischemic Cardiomyopathy:  Continue ACEI, Lasix, Spironolactone.  I will discontinue Toprol. I will start Coreg 3.125 mg twice a day. 3. Hypertension:   Controlled. 4. Hyperlipidemia:  Continue statin. 5. CAD:  She continues to have chest pressure/heaviness with activity. I still suspect that this is all related to volume overload. However, she  continues to smoke and had moderate nonobstructive disease 2 years ago. She could have progression of disease.  I will arrange a Lexiscan Myoview. This will likely be abnormal with anterior wall scar. I will be looking mainly for high-risk features.   6. Tobacco Abuse:  She knows she needs to quit.  7. S/p AICD:  F/u with EP as directed.  8. Disposition:  F/u with Dr. Johnsie Cancel in 1 month.  CHF appointment pending.    Signed, Richardson Dopp, PA-C  09/27/2013 12:23 PM

## 2013-09-27 NOTE — Progress Notes (Signed)
Device check in clinic, all functions normal, no changes made, full details in [facility].  OptiVol up & ongoing since 06/22/13---Scott, PA-C, aware.   ROV w/ Dr. Rayann Heman in 55mo.

## 2013-09-28 ENCOUNTER — Telehealth: Payer: Self-pay | Admitting: *Deleted

## 2013-09-28 DIAGNOSIS — I5023 Acute on chronic systolic (congestive) heart failure: Secondary | ICD-10-CM

## 2013-09-28 DIAGNOSIS — I2589 Other forms of chronic ischemic heart disease: Secondary | ICD-10-CM

## 2013-09-28 MED ORDER — POTASSIUM CHLORIDE CRYS ER 20 MEQ PO TBCR: 40.0000 meq | EXTENDED_RELEASE_TABLET | Freq: Every day | ORAL | Status: DC

## 2013-09-28 MED ORDER — FUROSEMIDE 40 MG PO TABS: 40.0000 mg | ORAL_TABLET | ORAL | Status: DC

## 2013-09-28 NOTE — Telephone Encounter (Signed)
lmptcb go over lab results and med changes

## 2013-09-28 NOTE — Telephone Encounter (Signed)
Follow up  ° ° ° °Returning call back to nurse  °

## 2013-09-28 NOTE — Telephone Encounter (Signed)
pt notified about lab results and to hold K+, increase lasix to 60 A/40 P with bmet 10/27. Pt gave verbal read to me x 2 to Plan of Care.

## 2013-10-02 ENCOUNTER — Other Ambulatory Visit (INDEPENDENT_AMBULATORY_CARE_PROVIDER_SITE_OTHER): Payer: BC Managed Care – PPO

## 2013-10-02 ENCOUNTER — Telehealth: Payer: Self-pay | Admitting: *Deleted

## 2013-10-02 DIAGNOSIS — I5023 Acute on chronic systolic (congestive) heart failure: Secondary | ICD-10-CM

## 2013-10-02 DIAGNOSIS — I10 Essential (primary) hypertension: Secondary | ICD-10-CM

## 2013-10-02 DIAGNOSIS — I2589 Other forms of chronic ischemic heart disease: Secondary | ICD-10-CM

## 2013-10-02 LAB — BASIC METABOLIC PANEL
BUN: 9 mg/dL (ref 6–23)
CO2: 31 mEq/L (ref 19–32)
Chloride: 97 mEq/L (ref 96–112)
Creatinine, Ser: 0.9 mg/dL (ref 0.4–1.2)
GFR: 64.38 mL/min (ref >60.00)
Glucose, Bld: 129 mg/dL — ABNORMAL HIGH (ref 70–99)
Potassium: 3.2 mEq/L — ABNORMAL LOW (ref 3.5–5.1)

## 2013-10-02 NOTE — Telephone Encounter (Signed)
lmptcb go over lab results

## 2013-10-03 MED ORDER — SPIRONOLACTONE 25 MG PO TABS: 25.0000 mg | ORAL_TABLET | Freq: Every day | ORAL | Status: DC

## 2013-10-03 NOTE — Telephone Encounter (Signed)
s/w pt and asked if she got my message last night to take K+ 20 meq x 1. Pt said yes, and she will start the increased dose of spironolactone 25 mg today. bmet to be done on 10/10/13 appt with Dr. Haroldine Laws at the HF clinic.

## 2013-10-03 NOTE — Telephone Encounter (Signed)
lmptcb go over lab results and med changes. I will try tcb later today

## 2013-10-03 NOTE — Telephone Encounter (Signed)
Follow UP:  Pt states she is returning Carol's call

## 2013-10-10 ENCOUNTER — Ambulatory Visit (HOSPITAL_COMMUNITY)
Admission: RE | Admit: 2013-10-10 | Discharge: 2013-10-10 | Disposition: A | Discharge status: Home / Self Care | Payer: BC Managed Care – PPO | Source: Ambulatory Visit | Attending: Internal Medicine | Admitting: Internal Medicine

## 2013-10-10 ENCOUNTER — Encounter (HOSPITAL_COMMUNITY): Payer: Self-pay

## 2013-10-10 VITALS — BP 124/78 | HR 110 | Ht 63.5 in | Wt 121.0 lb

## 2013-10-10 DIAGNOSIS — F17200 Nicotine dependence, unspecified, uncomplicated: Secondary | ICD-10-CM

## 2013-10-10 DIAGNOSIS — I428 Other cardiomyopathies: Secondary | ICD-10-CM | POA: Insufficient documentation

## 2013-10-10 DIAGNOSIS — Z720 Tobacco use: Secondary | ICD-10-CM

## 2013-10-10 DIAGNOSIS — I2589 Other forms of chronic ischemic heart disease: Secondary | ICD-10-CM

## 2013-10-10 DIAGNOSIS — J4489 Other specified chronic obstructive pulmonary disease: Secondary | ICD-10-CM | POA: Insufficient documentation

## 2013-10-10 DIAGNOSIS — J449 Chronic obstructive pulmonary disease, unspecified: Secondary | ICD-10-CM | POA: Insufficient documentation

## 2013-10-10 DIAGNOSIS — I5022 Chronic systolic (congestive) heart failure: Secondary | ICD-10-CM | POA: Insufficient documentation

## 2013-10-10 LAB — BASIC METABOLIC PANEL
CO2: 25 mEq/L (ref 19–32)
Calcium: 9.9 mg/dL (ref 8.4–10.5)
Creatinine, Ser: 0.78 mg/dL (ref 0.50–1.10)
GFR calc non Af Amer: 87 mL/min — ABNORMAL LOW (ref >90)
Glucose, Bld: 123 mg/dL — ABNORMAL HIGH (ref 70–99)
Potassium: 4.1 mEq/L (ref 3.5–5.1)
Sodium: 137 mEq/L (ref 135–145)

## 2013-10-10 MED ORDER — DIGOXIN 125 MCG PO TABS: 0.1250 mg | ORAL_TABLET | Freq: Every day | ORAL | Status: DC

## 2013-10-10 NOTE — Patient Instructions (Signed)
Lab today  Start Digoxin 0.125 mg daily  Your physician has recommended that you have a cardiopulmonary stress test (CPX). CPX testing is a non-invasive measurement of heart and lung function. It replaces a traditional treadmill stress test. This type of test provides a tremendous amount of information that relates not only to your present condition but also for future outcomes. This test combines measurements of you ventilation, respiratory gas exchange in the lungs, electrocardiogram (EKG), blood pressure and physical response before, during, and following an exercise protocol.  Your physician recommends that you schedule a follow-up appointment in: 3-4 weeks

## 2013-10-10 NOTE — Progress Notes (Signed)
Patient ID: [name], female   DOB: January 24, 1951, 62 y.o.   MRN: [id number]   [address], [address] [place], [place]  [address] Phone: [phone] Fax:  [phone]  Date:  10/10/2013   ID:  [name], DOB [birth year], MRN [place]:[id number]  PCP:  Ailene Ards, RN  Cardiologist:  Dr. Jenkins Rouge  Electrophysiologist:  Dr. Thompson Grayer    History of Present Illness: [name] is a 62 y.o. female who is referred to the HF Clinic for evaluation for advanced therapies.   She has a hx of CAD, s/p prior Ant MI, s/p prior POBA to LAD, Dx, RCA, CHF due to ICM with EF 24% with high scar burden by MRI (12/09), s/p ICD, HTN, HL, depression, tobacco abuse.   LHC (10/12):  dLAD 40, pCFX 30, OM1 50, pRCA 30.   MRI (12/09):  High scar burden, Inf and Apical AK, EF 24%.  Echo 10/14 EF 15% global HK. RV mild HK  Mod MR. Severe TR. PA 16mmHG   Lexiscan Myoview pending for 10/17/13:   She has been struggling with advanced HF symptoms recently and also volume overload. Diuretics have been titrated. Feels she has improved over the past few weeks. Has to stop frequently when cleaning the house. Works in the yard. Can walk around most of store without stopping. Gets very tired. Takes lasix 60/40. No orthopnea or PND. Occasional chest heaviness when she starts moving then goes away. Occasional dizziness. No ICD shocks. Smoking 6 cigarettes per day.   Leg pain while walking L>R   ABI 7/14 R 0.93 L 0.95  Lives alone. 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 One daughter lives close by. Son killed by MVA. Sister lives in [place].   Optivol interrogated personally. Fluid has now come down to baseline. Activity level 4 hours per day. No VT.   Labs (10/12):  K 3.2, creatinine 0.9, Hgb 13.5 Labs (9/14):    K 3.4, creatinine 1.0, pro BNP 1560, Hgb 11.8, HDL 25, LDL 60, ALT 29 Labs (10/14):  K 3.3=> 4.2, creatinine 1.2, BNP 1175 Labs (10/02/13) K 3.2 creatinine 0.9  Wt Readings from Last 3  Encounters:  09/27/13 121 lb (54.885 kg)  09/11/13 126 lb (57.153 kg)  09/06/13 131 lb (59.421 kg)     Past Medical History  Diagnosis Date  . Ischemic cardiomyopathy     MRI (12/09):  High scar burden, Inf and Apical AK, EF 24%.;  Echo (10/14): EF 15%, Gr 2 DD, mild to mod MR, mod LAE, RVSF mildly reduced, mod RAE, severe TR, PASP 49-53 (mod pulmonary HTN)  . Chronic systolic heart failure   . HTN (hypertension)   . Depression   . MI (myocardial infarction)   . ICD (implantable cardiac defibrillator) in place   . Asthma   . H/O hiatal hernia   . Arthritis     hands  . CAD (coronary artery disease)     a. s/p prior Ant MI, b. s/p prior POBA to LAD, Dx, RCA,;  c.  LHC (10/12):  dLAD 40, pCFX 30, OM1 50, pRCA 30.   Marland Kitchen HLD (hyperlipidemia)   . Tobacco abuse     Current Outpatient Prescriptions  Medication Sig Dispense Refill  . aspirin 81 MG tablet Take 81 mg by mouth daily.        . carvedilol (COREG) 3.125 MG tablet Take 1 tablet (3.125 mg total) by mouth 2 (two) times daily.  60 tablet  11  . Cyanocobalamin (VITAMIN B-12 CR PO) Take by mouth as directed.        Marland Kitchen dexlansoprazole (DEXILANT) 60 MG capsule Take 60 mg by mouth daily.      . fexofenadine (ALLEGRA) 180 MG tablet Take 180 mg by mouth as needed.       . fish oil-omega-3 fatty acids 1000 MG capsule Take 2 g by mouth daily.        . fluticasone (FLONASE) 50 MCG/ACT nasal spray Place 2 sprays into the nose daily.      . Fluticasone-Salmeterol (ADVAIR) 100-50 MCG/DOSE AEPB Inhale 1 puff into the lungs every 12 (twelve) hours.      . furosemide (LASIX) 40 MG tablet Take 1 tablet (40 mg total) by mouth as directed. 60 MG IN THE AM AND 40 MG IN THE PM      . isosorbide mononitrate (IMDUR) 30 MG 24 hr tablet Take 1 tablet (30 mg total) by mouth daily.  90 tablet  0  . metolazone (ZAROXOLYN) 2.5 MG tablet TAKE 2.5 MG 09/07/13 ONLY; TAKE 30 MINUTES BEFORE LASIX      . PARoxetine (PAXIL) 30 MG tablet Take 30 mg by mouth every  morning.        . potassium chloride SA (K-DUR,KLOR-CON) 20 MEQ tablet Take 2 tablets (40 mEq total) by mouth daily. ON HOLD PER SCOTT WEAVER, [facility] 09/28/13      . ramipril (ALTACE) 10 MG capsule Take 1 capsule (10 mg total) by mouth daily.  30 capsule  12  . rosuvastatin (CRESTOR) 5 MG tablet Take 5mg  on Monday, Wednesday and Friday  28 tablet  0  . spironolactone (ALDACTONE) 25 MG tablet Take 1 tablet (25 mg total) by mouth daily.      . [DISCONTINUED] Budesonide (PULMICORT IN) Inhale into the lungs as directed.        . [DISCONTINUED] omeprazole (PRILOSEC) 10 MG capsule Take 10 mg by mouth daily.         No current facility-administered medications for this encounter.    Allergies:    Allergies  Allergen Reactions  . Statins Other (See Comments)    Gradually tired.    Social History:  The patient  reports that she has been smoking Cigarettes.  She has been smoking about 0.00 packs per day. She has never used smokeless tobacco. She reports that she does not drink alcohol or use illicit drugs.   ROS:  Please see the history of present illness.   All other systems reviewed and negative.   PHYSICAL EXAM: VS:  BP 124/78  Pulse 110  Ht 5' 3.5" (1.613 m)  Wt 121 lb (54.885 kg)  BMI 21.10 kg/m2  SpO2 100% Well nourished, well developed, in no acute distress HEENT: normal Neck: no JVD Cardiac:  normal S1, S2; RRR; 1/6 systolic murmur LLSB Lungs:  Markedly decreased breath sounds bilaterally, long exp phase no rales, no wheezing  Abd: soft, nontender, no hepatomegaly Ext: no edema, cyanosis or clubbing. L DP non palpable. R DP 1+  Skin: warm and dry Neuro:  CNs 2-12 intact, no focal abnormalities noted  ASSESSMENT AND PLAN:  1. Chronic Systolic CHF:  EF 0000000 due to ICM with mild RV dysfunction 2. Hypertension:  Controlled. 3. Hyperlipidemia:  Continue statin. 4. CAD:  She continues to have chest pressure/heaviness with activity.  Lexiscan Myoview pending 5. COPD/Tobacco Abuse:   She knows she needs to quit.  6. S/p AICD:  F/u with EP as directed.  7. Diminished peripheral pulses with normal ABIs 7/14  Discussion:   She currently is improved though remains with NYHA III HF symptoms. I have reviewed her echo personally and she has severe LV dysfunction and mild RV dysfunction. On exam I suspect she also has significant COPD and probably PAD (though ABIs were normal). We will add digoxin 0.125 mg daily and arrange for CPX testing to help quantify her functional limitation and sort out how much is her lungs vs HF. We discussed the possibility of advanced therapies. She would not be transplant candidate due to COPD but might be VAD candidate depending on degree of COPD and family support.  Volume status looks good on exam and by Optivol. Check labs today.  Will arrange CPX and see her back in 3-4 weeks to review results.  Counseled on need to stop smoking completely and reviewed the option of eCigs as a bridge to complete cessation.   Time spent 60 minutes with over 1/2 of that time spent discussing above.  Yumiko Alkins,MD 10:37 AM

## 2013-10-12 ENCOUNTER — Encounter: Payer: Self-pay | Admitting: Internal Medicine

## 2013-10-17 ENCOUNTER — Ambulatory Visit (HOSPITAL_COMMUNITY): Payer: BC Managed Care – PPO | Attending: Cardiology | Admitting: Radiology

## 2013-10-17 ENCOUNTER — Encounter: Payer: Self-pay | Admitting: Cardiology

## 2013-10-17 VITALS — BP 118/61 | Ht 63.0 in | Wt 117.0 lb

## 2013-10-17 DIAGNOSIS — Z9861 Coronary angioplasty status: Secondary | ICD-10-CM | POA: Insufficient documentation

## 2013-10-17 DIAGNOSIS — I4949 Other premature depolarization: Secondary | ICD-10-CM

## 2013-10-17 DIAGNOSIS — I252 Old myocardial infarction: Secondary | ICD-10-CM | POA: Insufficient documentation

## 2013-10-17 DIAGNOSIS — I10 Essential (primary) hypertension: Secondary | ICD-10-CM | POA: Insufficient documentation

## 2013-10-17 DIAGNOSIS — E785 Hyperlipidemia, unspecified: Secondary | ICD-10-CM | POA: Insufficient documentation

## 2013-10-17 DIAGNOSIS — Z87891 Personal history of nicotine dependence: Secondary | ICD-10-CM | POA: Insufficient documentation

## 2013-10-17 DIAGNOSIS — I509 Heart failure, unspecified: Secondary | ICD-10-CM | POA: Insufficient documentation

## 2013-10-17 DIAGNOSIS — I2510 Atherosclerotic heart disease of native coronary artery without angina pectoris: Secondary | ICD-10-CM

## 2013-10-17 DIAGNOSIS — Z95810 Presence of automatic (implantable) cardiac defibrillator: Secondary | ICD-10-CM | POA: Insufficient documentation

## 2013-10-17 DIAGNOSIS — R079 Chest pain, unspecified: Secondary | ICD-10-CM | POA: Insufficient documentation

## 2013-10-17 DIAGNOSIS — R0789 Other chest pain: Secondary | ICD-10-CM

## 2013-10-17 DIAGNOSIS — R0602 Shortness of breath: Secondary | ICD-10-CM

## 2013-10-17 MED ORDER — TECHNETIUM TC 99M SESTAMIBI GENERIC - CARDIOLITE
10.0000 | Freq: Once | INTRAVENOUS | Status: AC | PRN
  Administered 2013-10-17: 10 via INTRAVENOUS

## 2013-10-17 MED ORDER — AMINOPHYLLINE 25 MG/ML IV SOLN
75.0000 mg | Freq: Once | INTRAVENOUS | Status: AC
  Administered 2013-10-17: 75 mg via INTRAVENOUS

## 2013-10-17 MED ORDER — REGADENOSON 0.4 MG/5ML IV SOLN
0.4000 mg | Freq: Once | INTRAVENOUS | Status: AC
  Administered 2013-10-17: 0.4 mg via INTRAVENOUS

## 2013-10-17 MED ORDER — TECHNETIUM TC 99M SESTAMIBI GENERIC - CARDIOLITE
30.0000 | Freq: Once | INTRAVENOUS | Status: AC | PRN
  Administered 2013-10-17: 30 via INTRAVENOUS

## 2013-10-17 NOTE — Progress Notes (Signed)
[facility] [address] [place], [place] [address] [phone]    Cardiology Nuclear Med Study  [name] is a 62 y.o. female     MRN : [id number]     DOB: 10-16-1951  Procedure Date: 10/17/2013  Nuclear Med Background Indication for Stress Test:  Evaluation for Ischemia and PTCA Patency History:  MI-'01 Stents/PTCA LAD RCA, AICD, CHF, MPI: '06 EF: 42% 09/20/13 ECHO: EF: 15% Cardiac Risk Factors: History of Smoking, Hypertension and Lipids  Symptoms:  Chest Pain and Chest Pain with Exertion   Nuclear Pre-Procedure Caffeine/Decaff Intake:  None NPO After: 8:00pm   Lungs:  clear O2 Sat: 95% on room air. IV 0.9% NS with Angio Cath:  22g  IV Site: R Hand  IV Started by:  Matilde Haymaker, RN  Chest Size (in):  36 Cup Size: A  Height: 5\' 3"  (1.6 m)  Weight:  117 lb (53.071 kg)  BMI:  Body mass index is 20.73 kg/(m^2). Tech Comments:  Coreg taken at 0700 today. This patient was given Aminophylline 75 mg for symptoms from the [place] injection.     Nuclear Med Study 1 or 2 day study: 1 day  Stress Test Type:  Carlton Adam  Reading MD: Kirk Ruths, MD  Order Authorizing Provider:  Verlin Dike  Resting Radionuclide: Technetium 24m Sestamibi  Resting Radionuclide Dose: 10.6 mCi   Stress Radionuclide:  Technetium 63m Sestamibi  Stress Radionuclide Dose: 32.8 mCi           Stress Protocol Rest HR: 85 Stress HR: 109  Rest BP: 118/61 Stress BP: 125/58  Exercise Time (min): n/a METS: n/a   Predicted Max HR: 156 bpm % Max HR: 69.87 bpm Rate Pressure Product: 13625   Dose of Adenosine (mg):  n/a Dose of Lexiscan: 0.4 mg  Dose of Atropine (mg): n/a Dose of Dobutamine: n/a mcg/kg/min (at max HR)  Stress Test Technologist: Perrin Maltese, EMT-P  Nuclear Technologist:  Annye Rusk, CNMT     Rest Procedure:  Myocardial perfusion imaging was performed at rest 45 minutes following the intravenous administration of Technetium 74m  Sestamibi. Rest ECG: NSR, PVC, cannot R/O prior anterior MI, nonspecific ST changes.  Stress Procedure:  The patient received IV Lexiscan 0.4 mg over 15-seconds.  Technetium 21m Sestamibi injected at 30-seconds. This patient had sob, neck stiffness and a headache with the Lexiscan injection. Quantitative spect images were obtained after a 45 minute delay. Stress ECG: No significant ST segment change suggestive of ischemia.  QPS Raw Data Images:  Acquisition technically good; LVE. Stress Images:  There is decreased uptake in the anterior wall, apex and inferior wall. Rest Images:  There is decreased uptake in the anterior wall, apex and inferior wall; mild reversibility noted towards the apex. Subtraction (SDS):  These findings are consistent with prior infarct and very mild peri-infarct ischemia. Transient Ischemic Dilatation (Normal <1.22):  0.99 Lung/Heart Ratio (Normal <0.45):  0.40  Quantitative Gated Spect Images QGS EDV:  218 ml QGS ESV:  166 ml  Impression Exercise Capacity:  Lexiscan with no exercise. BP Response:  Normal blood pressure response. Clinical Symptoms:  There is dyspnea. ECG Impression:  No significant ST segment change suggestive of ischemia. Comparison with Prior Nuclear Study: Inferior defect more prominent compared to previous  Overall Impression:  High risk stress nuclear study with large, severe intensity defects in the inferior wall, anterior wall and apex; minimal reversibility towards the apex; findings c/w prior infarct and very mild  peri-infarct ischemia..  LV Ejection Fraction: 24%.  LV Wall Motion:  Akinesis of the inferior wall, apex and anterior wall.  Kirk Ruths

## 2013-10-18 ENCOUNTER — Encounter: Payer: Self-pay | Admitting: Physician Assistant

## 2013-10-18 ENCOUNTER — Other Ambulatory Visit: Payer: Self-pay

## 2013-10-18 DIAGNOSIS — I10 Essential (primary) hypertension: Secondary | ICD-10-CM

## 2013-10-18 DIAGNOSIS — I5023 Acute on chronic systolic (congestive) heart failure: Secondary | ICD-10-CM

## 2013-10-18 MED ORDER — SPIRONOLACTONE 25 MG PO TABS: 25.0000 mg | ORAL_TABLET | Freq: Every day | ORAL | Status: DC

## 2013-10-25 ENCOUNTER — Encounter (HOSPITAL_COMMUNITY): Payer: BC Managed Care – PPO

## 2013-10-26 ENCOUNTER — Ambulatory Visit (HOSPITAL_COMMUNITY): Payer: BC Managed Care – PPO | Attending: Internal Medicine

## 2013-10-26 DIAGNOSIS — J449 Chronic obstructive pulmonary disease, unspecified: Secondary | ICD-10-CM | POA: Insufficient documentation

## 2013-10-26 DIAGNOSIS — J4489 Other specified chronic obstructive pulmonary disease: Secondary | ICD-10-CM | POA: Insufficient documentation

## 2013-10-26 DIAGNOSIS — R0602 Shortness of breath: Secondary | ICD-10-CM

## 2013-10-26 DIAGNOSIS — I5022 Chronic systolic (congestive) heart failure: Secondary | ICD-10-CM | POA: Insufficient documentation

## 2013-10-26 DIAGNOSIS — I2589 Other forms of chronic ischemic heart disease: Secondary | ICD-10-CM

## 2013-10-31 ENCOUNTER — Telehealth: Payer: Self-pay | Admitting: *Deleted

## 2013-10-31 ENCOUNTER — Encounter: Payer: Self-pay | Admitting: Internal Medicine

## 2013-10-31 NOTE — Telephone Encounter (Signed)
LMTCB .  RE MYOVIEW RESULTS /CY

## 2013-11-01 ENCOUNTER — Encounter (HOSPITAL_COMMUNITY): Payer: Self-pay | Admitting: Cardiology

## 2013-11-01 ENCOUNTER — Other Ambulatory Visit (HOSPITAL_COMMUNITY): Payer: Self-pay | Admitting: Adult Health

## 2013-11-01 ENCOUNTER — Ambulatory Visit (HOSPITAL_COMMUNITY)
Admission: RE | Admit: 2013-11-01 | Discharge: 2013-11-01 | Disposition: A | Discharge status: Home / Self Care | Payer: BC Managed Care – PPO | Source: Ambulatory Visit | Attending: Internal Medicine | Admitting: Internal Medicine

## 2013-11-01 ENCOUNTER — Encounter (HOSPITAL_COMMUNITY): Payer: Self-pay

## 2013-11-01 VITALS — BP 112/60 | HR 83 | Wt 120.8 lb

## 2013-11-01 DIAGNOSIS — I252 Old myocardial infarction: Secondary | ICD-10-CM | POA: Insufficient documentation

## 2013-11-01 DIAGNOSIS — I5022 Chronic systolic (congestive) heart failure: Secondary | ICD-10-CM

## 2013-11-01 DIAGNOSIS — Z720 Tobacco use: Secondary | ICD-10-CM

## 2013-11-01 DIAGNOSIS — J4489 Other specified chronic obstructive pulmonary disease: Secondary | ICD-10-CM | POA: Insufficient documentation

## 2013-11-01 DIAGNOSIS — J449 Chronic obstructive pulmonary disease, unspecified: Secondary | ICD-10-CM | POA: Insufficient documentation

## 2013-11-01 DIAGNOSIS — I2589 Other forms of chronic ischemic heart disease: Secondary | ICD-10-CM | POA: Insufficient documentation

## 2013-11-01 DIAGNOSIS — Z79899 Other long term (current) drug therapy: Secondary | ICD-10-CM | POA: Insufficient documentation

## 2013-11-01 DIAGNOSIS — F17200 Nicotine dependence, unspecified, uncomplicated: Secondary | ICD-10-CM

## 2013-11-01 DIAGNOSIS — R0789 Other chest pain: Secondary | ICD-10-CM | POA: Insufficient documentation

## 2013-11-01 DIAGNOSIS — I2510 Atherosclerotic heart disease of native coronary artery without angina pectoris: Secondary | ICD-10-CM

## 2013-11-01 DIAGNOSIS — E785 Hyperlipidemia, unspecified: Secondary | ICD-10-CM | POA: Insufficient documentation

## 2013-11-01 DIAGNOSIS — I10 Essential (primary) hypertension: Secondary | ICD-10-CM | POA: Insufficient documentation

## 2013-11-01 DIAGNOSIS — Z7982 Long term (current) use of aspirin: Secondary | ICD-10-CM | POA: Insufficient documentation

## 2013-11-01 DIAGNOSIS — Z95810 Presence of automatic (implantable) cardiac defibrillator: Secondary | ICD-10-CM | POA: Insufficient documentation

## 2013-11-01 DIAGNOSIS — I509 Heart failure, unspecified: Secondary | ICD-10-CM | POA: Insufficient documentation

## 2013-11-01 LAB — CBC WITH DIFFERENTIAL/PLATELET
Basophils Relative: 1 % (ref 0–1)
Eosinophils Absolute: 0.4 10*3/uL (ref 0.0–0.7)
HCT: 40 % (ref 36.0–46.0)
Hemoglobin: 13.4 g/dL (ref 12.0–15.0)
Lymphs Abs: 1 10*3/uL (ref 0.7–4.0)
MCH: 27.6 pg (ref 26.0–34.0)
MCHC: 33.5 g/dL (ref 30.0–36.0)
MCV: 82.5 fL (ref 78.0–100.0)
Monocytes Absolute: 0.8 10*3/uL (ref 0.1–1.0)
Monocytes Relative: 16 % — ABNORMAL HIGH (ref 3–12)
Neutrophils Relative %: 56 % (ref 43–77)
Platelets: 177 10*3/uL (ref 150–400)
RBC: 4.85 MIL/uL (ref 3.87–5.11)

## 2013-11-01 LAB — PROTIME-INR
INR: 0.94 (ref 0.00–1.49)
Prothrombin Time: 12.4 seconds (ref 11.6–15.2)

## 2013-11-01 MED ORDER — CARVEDILOL 6.25 MG PO TABS: 6.2500 mg | ORAL_TABLET | Freq: Two times a day (BID) | ORAL | Status: DC

## 2013-11-01 NOTE — Patient Instructions (Addendum)
Your physician has requested that you have a cardiac catheterization. Cardiac catheterization is used to diagnose and/or treat various heart conditions. Doctors may recommend this procedure for a number of different reasons. The most common reason is to evaluate chest pain. Chest pain can be a symptom of coronary artery disease (CAD), and cardiac catheterization can show whether plaque is narrowing or blocking your heart's arteries. This procedure is also used to evaluate the valves, as well as measure the blood flow and oxygen levels in different parts of your heart. For further information please visit [email or web address]. Please follow instruction sheet, as given.  Follow up in 6 weeks   Take carvedilol 6.25 mg twice a day  Do the following things EVERYDAY: 1) Weigh yourself in the morning before breakfast. Write it down and keep it in a log. 2) Take your medicines as prescribed 3) Eat low salt foods-Limit salt (sodium) to 2000 mg per day.  4) Stay as active as you can everyday 5) Limit all fluids for the day to less than 2 liters

## 2013-11-01 NOTE — Progress Notes (Addendum)
Patient ID: [name], female   DOB: 1951/03/31, 62 y.o.   MRN: [id number]   [address], [address] [place], [place]  [address] Phone: [phone] Fax:  [phone]  Date:  11/01/2013   ID:  [name], DOB 26-Jul-1951, MRN [id number]  PCP:  Ailene Ards, RN  Cardiologist:  Dr. Jenkins Rouge  Electrophysiologist:  Dr. Thompson Grayer    History of Present Illness: [name] is a 62 y.o. female who is referred to the [facility] for evaluation for advanced therapies.   She has a hx of CAD, s/p prior Ant MI, s/p prior POBA to LAD, Dx, RCA, CHF due to ICM with EF 24% with high scar burden by MRI (12/09), s/p ICD, HTN, HL, depression, tobacco abuse.   ABI 7/14 R 0.93 L 0.95 LHC (10/12):  dLAD 40, pCFX 30, OM1 50, pRCA 30.   MRI (12/09):  High scar burden, Inf and Apical AK, EF 24%.  Echo 10/14 EF 15% global HK. RV mild HK  Mod MR. Severe TR. PA 34mmHG   Lexiscan Myoview (11/14): LV Ejection Fraction: 24%. Extensive scar in the inferior wall, apex and anterior wall. Minimal reversibility    CPX 11/14 FVC 2.08 (67%)  FEV1 1.58 (66%)  FEV1/FVC 76%   Resting HR: 84 Peak HR: 142 (91% age predicted max HR) BP rest: 114/60 BP peak: 148/70 (IPE)  Peak VO2: 15 (63% predicted peak VO2) VE/VCO2 slope: 32 OUES: 0.85 Peak RER: 1.33 Ventilatory Threshold: 12.1 (50.8% predicted peak VO2) VE/MVV: 60.1% O2pulse: 6 (75% predicted O2pulse)   Returns for f/u to discuss results of recent CPX and Myoview. Feels some better but still with NYHA II-III dyspnea and occasional chest tightness. No edema, orthopnea or PND. Continues to smoke 4 cigs per day.   Optivol interrogated personally. Fluid has now come down to baseline. Activity level 4 hours per day. No VT.   Labs (10/12):  K 3.2, creatinine 0.9, Hgb 13.5 Labs (9/14):    K 3.4, creatinine 1.0, pro BNP 1560, Hgb 11.8, HDL 25, LDL 60, ALT 29 Labs (10/14):  K 3.3=> 4.2, creatinine 1.2, BNP 1175 Labs (10/02/13) K 3.2  creatinine 0.9  Wt Readings from Last 3 Encounters:  11/01/13 120 lb 12.8 oz (54.795 kg)  10/17/13 117 lb (53.071 kg)  10/10/13 121 lb (54.885 kg)     Past Medical History  Diagnosis Date  . Ischemic cardiomyopathy     MRI (12/09):  High scar burden, Inf and Apical AK, EF 24%.;  Echo (10/14): EF 15%, Gr 2 DD, mild to mod MR, mod LAE, RVSF mildly reduced, mod RAE, severe TR, PASP 49-53 (mod pulmonary HTN)  . Chronic systolic heart failure   . HTN (hypertension)   . Depression   . MI (myocardial infarction)   . ICD (implantable cardiac defibrillator) in place   . Asthma   . H/O hiatal hernia   . Arthritis     hands  . CAD (coronary artery disease)     a. s/p prior Ant MI, b. s/p prior POBA to LAD, Dx, RCA,;  c.  LHC (10/12):  dLAD 40, pCFX 30, OM1 50, pRCA 30;  d.  Carlton Adam Myoview (11/14):  High risk study; inferior, anterior, apical defects; minimal reversibility toward the apex; consistent with prior infarct and very mild peri-infarct ischemia, EF 24%, inferior/apical/anterior akinesis  . HLD (hyperlipidemia)   . Tobacco abuse     Current Outpatient Prescriptions  Medication Sig Dispense Refill  .  aspirin 81 MG tablet Take 81 mg by mouth daily.        . carvedilol (COREG) 3.125 MG tablet Take 1 tablet (3.125 mg total) by mouth 2 (two) times daily.  60 tablet  11  . Cyanocobalamin (VITAMIN B-12 CR PO) Take by mouth as directed.        Marland Kitchen dexlansoprazole (DEXILANT) 60 MG capsule Take 60 mg by mouth daily.      . digoxin (LANOXIN) 0.125 MG tablet Take 1 tablet (0.125 mg total) by mouth daily.  30 tablet  3  . fexofenadine (ALLEGRA) 180 MG tablet Take 180 mg by mouth as needed.       . fish oil-omega-3 fatty acids 1000 MG capsule Take 2 g by mouth daily.        . fluticasone (FLONASE) 50 MCG/ACT nasal spray Place 2 sprays into the nose daily.      . Fluticasone-Salmeterol (ADVAIR) 100-50 MCG/DOSE AEPB Inhale 1 puff into the lungs every 12 (twelve) hours.      . furosemide (LASIX) 40  MG tablet Take 1 tablet (40 mg total) by mouth as directed. 60 MG IN THE AM AND 40 MG IN THE PM      . isosorbide mononitrate (IMDUR) 30 MG 24 hr tablet Take 1 tablet (30 mg total) by mouth daily.  90 tablet  0  . metolazone (ZAROXOLYN) 2.5 MG tablet TAKE 2.5 MG 09/07/13 ONLY; TAKE 30 MINUTES BEFORE LASIX      . PARoxetine (PAXIL) 30 MG tablet Take 30 mg by mouth every morning.        . ramipril (ALTACE) 10 MG capsule Take 1 capsule (10 mg total) by mouth daily.  30 capsule  12  . rosuvastatin (CRESTOR) 5 MG tablet Take 5mg  on Monday, Wednesday and Friday  28 tablet  0  . spironolactone (ALDACTONE) 25 MG tablet Take 1 tablet (25 mg total) by mouth daily.  30 tablet  6  . [DISCONTINUED] Budesonide (PULMICORT IN) Inhale into the lungs as directed.        . [DISCONTINUED] omeprazole (PRILOSEC) 10 MG capsule Take 10 mg by mouth daily.         No current facility-administered medications for this encounter.    Allergies:    Allergies  Allergen Reactions  . Statins Other (See Comments)    Gradually tired.    Social History:  The patient  reports that she has been smoking Cigarettes.  She has been smoking about 0.00 packs per day. She has never used smokeless tobacco. She reports that she does not drink alcohol or use illicit drugs.   ROS:  Please see the history of present illness.   All other systems reviewed and negative.   PHYSICAL EXAM: VS:  BP 112/60  Pulse 83  Wt 120 lb 12.8 oz (54.795 kg)  SpO2 98% Well nourished, well developed, in no acute distress HEENT: normal Neck: no JVD Cardiac:  normal S1, S2; RRR; 1/6 systolic murmur LLSB Lungs:  Markedly decreased breath sounds bilaterally, long exp phase no rales, no wheezing  Abd: soft, nontender, no hepatomegaly Ext: no edema, cyanosis or clubbing. L DP non palpable. R DP 1+  Skin: warm and dry Neuro:  CNs 2-12 intact, no focal abnormalities noted  ASSESSMENT AND PLAN:  1. Chronic Systolic CHF:  EF 0000000 due to ICM with mild RV  dysfunction 2. Hypertension:  Controlled. 3. Hyperlipidemia:  Continue statin. 4. CAD:  She continues to have chest pressure/heaviness with activity.  5. COPD/Tobacco Abuse:   6. S/p AICD:  F/u with EP as directed.  7. Diminished peripheral pulses with normal ABIs 7/14  Discussion:   CPX test and Myoview results reviewed with her personally. Both Myoview and previous MRI show significant scar burden but EF now worse. We discussed medical management versus cath. Given ongoing chest tightness we have decided to pursue cath. Risks discussed.  CPX testing shows moderate limitation due to HF but she does not meet criteria for advanced therapies at this point. Will continue with aggressive medical therapy. Will plan to titrate carvedilol after cath.   Volume status looks good on exam and by Optivol. Check labs today.  Counseled on need to stop smoking completely and reviewed the option of eCigs as a bridge to complete cessation.   Time spent 50 minutes with over 1/2 of that time spent discussing above.  Tanishi Nault,MD 11:58 AM

## 2013-11-02 NOTE — Addendum Note (Signed)
Encounter addended by: Jolaine Artist, MD on: 11/02/2013  9:49 PM<BR>     Documentation filed: Notes Section, Charges VN

## 2013-11-03 ENCOUNTER — Encounter (HOSPITAL_COMMUNITY): Payer: Self-pay | Admitting: Pharmacy Technician

## 2013-11-06 ENCOUNTER — Ambulatory Visit (HOSPITAL_COMMUNITY)
Admission: RE | Admit: 2013-11-06 | Payer: BC Managed Care – PPO | Source: Ambulatory Visit | Admitting: Internal Medicine

## 2013-11-06 ENCOUNTER — Encounter (HOSPITAL_COMMUNITY): Admission: RE | Payer: Self-pay | Source: Ambulatory Visit

## 2013-11-06 SURGERY — LEFT AND RIGHT HEART CATHETERIZATION WITH CORONARY ANGIOGRAM
Anesthesia: LOCAL

## 2013-11-06 NOTE — Telephone Encounter (Signed)
LMTCB ./CY 

## 2013-11-06 NOTE — Telephone Encounter (Signed)
Follow Up  ° °Pt returned call for results °

## 2013-11-09 NOTE — Telephone Encounter (Signed)
SPOKE WITH  PT . PT   CANCELLED  CATH  DUE  TO HAVING  A COLD  HAS APPT  TOM  WITH DR Johnsie Cancel  INSTRUCTED TO KEEP  APPT .VERBALIZED  UNDERSTANDING.

## 2013-11-10 ENCOUNTER — Ambulatory Visit (INDEPENDENT_AMBULATORY_CARE_PROVIDER_SITE_OTHER): Payer: BC Managed Care – PPO | Admitting: Cardiovascular Disease

## 2013-11-10 VITALS — BP 130/80 | HR 72 | Wt 120.0 lb

## 2013-11-10 DIAGNOSIS — E785 Hyperlipidemia, unspecified: Secondary | ICD-10-CM

## 2013-11-10 DIAGNOSIS — I2510 Atherosclerotic heart disease of native coronary artery without angina pectoris: Secondary | ICD-10-CM

## 2013-11-10 DIAGNOSIS — I10 Essential (primary) hypertension: Secondary | ICD-10-CM

## 2013-11-10 DIAGNOSIS — I5022 Chronic systolic (congestive) heart failure: Secondary | ICD-10-CM

## 2013-11-10 DIAGNOSIS — F17200 Nicotine dependence, unspecified, uncomplicated: Secondary | ICD-10-CM

## 2013-11-10 NOTE — Assessment & Plan Note (Signed)
Cholesterol is at goal.  Continue current dose of statin and diet Rx.  No myalgias or side effects.  F/U  LFT's in 6 months. Lab Results  Component Value Date   LDLCALC 60 08/28/2013

## 2013-11-10 NOTE — Assessment & Plan Note (Signed)
Well controlled.  Continue current medications and low sodium Dash type diet.    

## 2013-11-10 NOTE — Progress Notes (Signed)
Patient ID: [name], female   DOB: 07/23/1951, 62 y.o.   MRN: [id number] [name] is a 62 y.o. female who is referred to the [facility] Clinic for evaluation for advanced therapies.  She has a hx of CAD, s/p prior Ant MI, s/p prior POBA to LAD, Dx, RCA, CHF due to ICM with EF 24% with high scar burden by MRI (12/09), s/p ICD, HTN, HL, depression, tobacco abuse.  ABI 7/14 R 0.93 L 0.95  LHC (10/12): dLAD 40, pCFX 30, OM1 50, pRCA 30.  MRI (12/09): High scar burden, Inf and Apical AK, EF 24%.  Echo 10/14 EF 15% global HK. RV mild HK Mod MR. Severe TR. PA 17mmHG  Lexiscan Myoview (11/14): LV Ejection Fraction: 24%. Extensive scar in the inferior wall, apex and anterior wall. Minimal reversibility  CPX 11/14  FVC 2.08 (67%)  FEV1 1.58 (66%)  FEV1/FVC 76%   Resting HR: 84 Peak HR: 142 (91% age predicted max HR) BP rest: 114/60 BP peak: 148/70 (IPE)  Peak VO2: 15 (63% predicted peak VO2) VE/VCO2 slope: 32 OUES: 0.85 Peak RER: 1.33 Ventilatory Threshold: 12.1 (50.8% predicted peak VO2) VE/MVV: 60.1% O2pulse: 6 (75% predicted O2pulse)  Saw Dr Haroldine Laws in November and cath recommended  She had URI and sinus congestion and cancelled. Doing better Feels her dyspnea is improved Weight stable Still smoking Had flu shot    ROS: Denies fever, malais, weight loss, blurry vision, decreased visual acuity, cough, sputum, SOB, hemoptysis, pleuritic pain, palpitaitons, heartburn, abdominal pain, melena, lower extremity edema, claudication, or rash.  All other systems reviewed and negative  General: Affect appropriate Chronically ill white female  HEENT: normal Neck supple with no adenopathy JVP normal no bruits no thyromegaly  AICD under left clavicle Lungs clear with no wheezing and good diaphragmatic motion Heart:  S1/S2 no murmur, no rub, gallop or click PMI normal Abdomen: benighn, BS positve, no tenderness, no AAA no bruit.  No HSM or HJR Distal pulses intact with no bruits No edema Neuro  non-focal Skin warm and dry No muscular weakness   Current Outpatient Prescriptions  Medication Sig Dispense Refill  . aspirin 81 MG tablet Take 81 mg by mouth daily.        . carvedilol (COREG) 6.25 MG tablet Take 1 tablet (6.25 mg total) by mouth 2 (two) times daily.  60 tablet  11  . dexlansoprazole (DEXILANT) 60 MG capsule Take 60 mg by mouth daily.      . digoxin (LANOXIN) 0.125 MG tablet Take 1 tablet (0.125 mg total) by mouth daily.  30 tablet  3  . fexofenadine (ALLEGRA) 180 MG tablet Take 180 mg by mouth daily as needed for allergies.       . fish oil-omega-3 fatty acids 1000 MG capsule Take 2 g by mouth daily.       . fluticasone (FLONASE) 50 MCG/ACT nasal spray Place 2 sprays into the nose daily.      . Fluticasone-Salmeterol (ADVAIR) 100-50 MCG/DOSE AEPB Inhale 1 puff into the lungs every 12 (twelve) hours.      . furosemide (LASIX) 40 MG tablet Take 40-60 mg by mouth 2 (two) times daily. 60 MG IN THE AM AND 40 MG IN THE PM      . isosorbide mononitrate (IMDUR) 30 MG 24 hr tablet Take 1 tablet (30 mg total) by mouth daily.  90 tablet  0  . PARoxetine (PAXIL) 30 MG tablet Take 30 mg by mouth daily.       Marland Kitchen  ramipril (ALTACE) 10 MG capsule Take 1 capsule (10 mg total) by mouth daily.  30 capsule  12  . rosuvastatin (CRESTOR) 5 MG tablet Take 5 mg by mouth 3 (three) times a week. Take 5mg  on Monday, Wednesday and Friday      . spironolactone (ALDACTONE) 25 MG tablet Take 1 tablet (25 mg total) by mouth daily.  30 tablet  6  . vitamin B-12 (CYANOCOBALAMIN) 250 MCG tablet Take 250 mcg by mouth daily.      . [DISCONTINUED] Budesonide (PULMICORT IN) Inhale into the lungs as directed.        . [DISCONTINUED] omeprazole (PRILOSEC) 10 MG capsule Take 10 mg by mouth daily.         No current facility-administered medications for this visit.    Allergies  Statins  Electrocardiogram:  SR rate 73 narrow QRS old anterior MI  Assessment and Plan

## 2013-11-10 NOTE — Assessment & Plan Note (Signed)
Counseled for less than 10 minutes little motivation to quit Dr DB discussed E cigs with her  Encouraged her to talk to primary about pneumonia shot

## 2013-11-10 NOTE — Assessment & Plan Note (Signed)
Stable with no angina and good activity level.  Continue medical Rx  

## 2013-11-10 NOTE — Assessment & Plan Note (Signed)
Improved on good medical Rx  CPX ok and OptiVol low.  QRS narrow and would not benefit from BiV  F/U CHF clinic and consider rescheduling right and left cath

## 2013-11-10 NOTE — Patient Instructions (Signed)
Your physician recommends that you schedule a follow-up appointment in: [place]  Your physician recommends that you continue on your current medications as directed. Please refer to the Current Medication list given to you today.

## 2013-11-20 ENCOUNTER — Encounter: Payer: Self-pay | Admitting: Internal Medicine

## 2013-11-22 ENCOUNTER — Other Ambulatory Visit: Payer: BC Managed Care – PPO | Admitting: Cardiovascular Disease

## 2013-12-11 ENCOUNTER — Other Ambulatory Visit: Payer: Self-pay

## 2013-12-11 DIAGNOSIS — I10 Essential (primary) hypertension: Secondary | ICD-10-CM

## 2013-12-11 DIAGNOSIS — I5023 Acute on chronic systolic (congestive) heart failure: Secondary | ICD-10-CM

## 2013-12-11 MED ORDER — SPIRONOLACTONE 25 MG PO TABS: 25.0000 mg | ORAL_TABLET | Freq: Every day | ORAL | Status: DC

## 2013-12-13 ENCOUNTER — Other Ambulatory Visit (INDEPENDENT_AMBULATORY_CARE_PROVIDER_SITE_OTHER): Payer: BC Managed Care – PPO

## 2013-12-13 DIAGNOSIS — E785 Hyperlipidemia, unspecified: Secondary | ICD-10-CM

## 2013-12-13 DIAGNOSIS — R748 Abnormal levels of other serum enzymes: Secondary | ICD-10-CM

## 2013-12-13 LAB — LIPID PANEL
CHOL/HDL RATIO: 4
Cholesterol: 221 mg/dL — ABNORMAL HIGH (ref 0–200)
HDL: 55.2 mg/dL (ref >39.00)
TRIGLYCERIDES: 104 mg/dL (ref 0.0–149.0)
VLDL: 20.8 mg/dL (ref 0.0–40.0)

## 2013-12-13 LAB — HEPATIC FUNCTION PANEL
ALT: 24 U/L (ref 0–35)
AST: 24 U/L (ref 0–37)
Albumin: 3.9 g/dL (ref 3.5–5.2)
Alkaline Phosphatase: 114 U/L (ref 39–117)
Bilirubin, Direct: 0.2 mg/dL (ref 0.0–0.3)
Total Bilirubin: 1.1 mg/dL (ref 0.3–1.2)
Total Protein: 7.5 g/dL (ref 6.0–8.3)

## 2013-12-13 LAB — LDL CHOLESTEROL, DIRECT: LDL DIRECT: 134.7 mg/dL

## 2013-12-14 ENCOUNTER — Ambulatory Visit (INDEPENDENT_AMBULATORY_CARE_PROVIDER_SITE_OTHER): Payer: BC Managed Care – PPO | Admitting: Pharmacist

## 2013-12-14 VITALS — Wt 121.0 lb

## 2013-12-14 DIAGNOSIS — Z79899 Other long term (current) drug therapy: Secondary | ICD-10-CM

## 2013-12-14 DIAGNOSIS — E785 Hyperlipidemia, unspecified: Secondary | ICD-10-CM

## 2013-12-14 MED ORDER — PSYLLIUM 30.9 % PO POWD: ORAL | Status: DC

## 2013-12-14 NOTE — Assessment & Plan Note (Addendum)
Patient's LDL went up from 60 to 134 mg/dL over past 3 months despite not changing therapy.  LDL was 184 and dropped to 60 mg/dL on Crestor 5 mg tiw which seemed like an unreasonable reduction, but the LDL of 134 mg/dL makes more sense given baseline LDL of 184 mg/dL.  We discussed Zetia today, but she tells me she has failed this in the past due to muscle aches, and patient is fearful of taking anything else that may cause myalgias again.  Given her h/o constipation, will not consider Welchol at this time either.  Instead, we discussed using Metamucil and starting it at twice daily.  This will help both bowel regularity and also cholesterol reduction without risk of further muscle/joint pain.  We discussed the need to stop smoking as well, and she agrees to try to E-cigarette soon.  Samples of Crestor given today.  Patient states she would be interested in a clinical trial if she met the in criteria given she can't take a daily statin. Plan: 1.  Continue Crestor 5 mg three times weekly. 2.  Add Metamucil twice daily.  Mix 1 tablespoon of powder in 8 ounces of water and drink it. 3.  Continue low fat diet. 4.  Suggest trying E-Cigarettes to quit smoking. 5.  Recheck cholesterol and liver function in 3 months (03/14/14 - fasting) - see [place] in lipid clinic 1 day later (03/15/14)

## 2013-12-14 NOTE — Patient Instructions (Signed)
1.  Continue Crestor 5 mg three times weekly. 2.  Add Metamucil twice daily.  Mix 1 tablespoon of powder in 8 ounces of water and drink it. 3.  Continue low fat diet. 4.  Suggest trying E-Cigarettes to quit smoking. 5.  Recheck cholesterol and liver function in 3 months (03/14/14 - fasting) - see [place] in lipid clinic 1 day later (03/15/14)

## 2013-12-14 NOTE — Progress Notes (Signed)
HPI  [name] is a 63 yo pt referred by Dr. Johnsie Cancel for statin intolerance.  She has a history of ischemic cardiomyopathy and stents to LAD and RCA.  Has history of elevated cholesterol.  Was first placed on Lipitor 20mg  and tolerated with no problems until dose was increased to 40mg  daily.  She then had problems with muscle weakness and lack of energy.  She was switched to Crestor (unsure of dose) with same effects and most recently switched back to Lipitor 40mg  with no success.  She tells me today that she tried Zetia in the past, and had to stop as this caused muscle aches similar to these statins as well.   Last year we restarted Crestor at 5mg  on Monday, Wednesday and Friday. She has tolerated this with no issues other than slight muscle pains which she isn't positive is due to statin.  LDL dropped from 184 mg/dL to 60 mg/dL from 06/2013 to 08/2013 on Crestor tiw, and now LDL is up to 134 mg/dL despite patient stating she has been compliant with this agent.  LDL still down from 184 mg/dL to 134 mg/dL on Crestor 5 mg tiw over past 6 months, which is an expected reduction on this type of dosage.   Reviewed pt's diet.  She is trying to make more improvements.  She has increased her fiber through fiber bars and fruit and vegetables.  She typically eats breakfast, then a later lunch (3 pm).  Rarely eats an evening meal.  She tells me she has a h/o constipation which she has to use Miralax periodically for.  She admits to smoking 6 cigarettes daily, and is having a hard time smoking less than this.  She has failed nicotine replacement therapy.  She has thought about the E-cigarette, but hasn't tried it yet.  Pt is currently not exercising.  She is limited by her heart failure. She had a habit of walking with her daughter on a regular basis but her daughter moved and so she stopped walking.  She does have a elipical in her home as well.  She babysits her 2 grandchidren (age 35 and 60) 1 day of the week.     Intolerant:  Daily Crestor, daily lipitor, Zetia.  Labs:   12/2013 - TC 221, LDL 134, TG 104, HDL 55  (Crestor 5 mg tiw, fish oil 2 g/d) 08/2013 - TC 100, LDL 60 (Crestor 5 mg tiw, fish oil 2 g/d) 06/2013 - TC 258, LDL 184 (fish oil only)  Current Outpatient Prescriptions on File Prior to Visit  Medication Sig Dispense Refill  . aspirin 81 MG tablet Take 81 mg by mouth daily.        . carvedilol (COREG) 6.25 MG tablet Take 1 tablet (6.25 mg total) by mouth 2 (two) times daily.  60 tablet  11  . dexlansoprazole (DEXILANT) 60 MG capsule Take 60 mg by mouth daily.      . digoxin (LANOXIN) 0.125 MG tablet Take 1 tablet (0.125 mg total) by mouth daily.  30 tablet  3  . fexofenadine (ALLEGRA) 180 MG tablet Take 180 mg by mouth daily as needed for allergies.       . fish oil-omega-3 fatty acids 1000 MG capsule Take 2 g by mouth daily.       . fluticasone (FLONASE) 50 MCG/ACT nasal spray Place 2 sprays into the nose daily.      . Fluticasone-Salmeterol (ADVAIR) 100-50 MCG/DOSE AEPB Inhale 1 puff into the lungs every 12 (  twelve) hours.      . furosemide (LASIX) 40 MG tablet Take 40-60 mg by mouth 2 (two) times daily. 60 MG IN THE AM AND 40 MG IN THE PM      . PARoxetine (PAXIL) 30 MG tablet Take 30 mg by mouth daily.       . ramipril (ALTACE) 10 MG capsule Take 1 capsule (10 mg total) by mouth daily.  30 capsule  12  . rosuvastatin (CRESTOR) 5 MG tablet Take 5 mg by mouth 3 (three) times a week. Take 5mg  on Monday, Wednesday and Friday      . spironolactone (ALDACTONE) 25 MG tablet Take 1 tablet (25 mg total) by mouth daily.  30 tablet  6  . vitamin B-12 (CYANOCOBALAMIN) 250 MCG tablet Take 250 mcg by mouth daily.      . [DISCONTINUED] Budesonide (PULMICORT IN) Inhale into the lungs as directed.        . [DISCONTINUED] omeprazole (PRILOSEC) 10 MG capsule Take 10 mg by mouth daily.         No current facility-administered medications on file prior to visit.   Allergies  Allergen Reactions  .  Statins Other (See Comments)    Gradually tired.

## 2014-01-04 ENCOUNTER — Encounter (HOSPITAL_COMMUNITY): Payer: Self-pay

## 2014-01-04 ENCOUNTER — Ambulatory Visit (HOSPITAL_COMMUNITY)
Admission: RE | Admit: 2014-01-04 | Discharge: 2014-01-04 | Disposition: A | Discharge status: Home / Self Care | Payer: BC Managed Care – PPO | Source: Ambulatory Visit | Attending: Pediatrics | Admitting: Pediatrics

## 2014-01-04 VITALS — BP 106/48 | HR 54 | Wt 118.4 lb

## 2014-01-04 DIAGNOSIS — F17200 Nicotine dependence, unspecified, uncomplicated: Secondary | ICD-10-CM

## 2014-01-04 DIAGNOSIS — I509 Heart failure, unspecified: Secondary | ICD-10-CM | POA: Insufficient documentation

## 2014-01-04 DIAGNOSIS — I5022 Chronic systolic (congestive) heart failure: Secondary | ICD-10-CM | POA: Insufficient documentation

## 2014-01-04 DIAGNOSIS — I2510 Atherosclerotic heart disease of native coronary artery without angina pectoris: Secondary | ICD-10-CM

## 2014-01-04 DIAGNOSIS — J449 Chronic obstructive pulmonary disease, unspecified: Secondary | ICD-10-CM | POA: Insufficient documentation

## 2014-01-04 DIAGNOSIS — I10 Essential (primary) hypertension: Secondary | ICD-10-CM | POA: Insufficient documentation

## 2014-01-04 DIAGNOSIS — J4489 Other specified chronic obstructive pulmonary disease: Secondary | ICD-10-CM | POA: Insufficient documentation

## 2014-01-04 LAB — BASIC METABOLIC PANEL
BUN: 22 mg/dL (ref 6–23)
CO2: 28 meq/L (ref 19–32)
Calcium: 9.7 mg/dL (ref 8.4–10.5)
Chloride: 95 mEq/L — ABNORMAL LOW (ref 96–112)
Creatinine, Ser: 0.92 mg/dL (ref 0.50–1.10)
GFR calc non Af Amer: 64 mL/min — ABNORMAL LOW (ref >90)
GFR, EST AFRICAN AMERICAN: 75 mL/min — AB (ref >90)
Glucose, Bld: 97 mg/dL (ref 70–99)
POTASSIUM: 4.4 meq/L (ref 3.7–5.3)
Sodium: 137 mEq/L (ref 137–147)

## 2014-01-04 NOTE — Progress Notes (Signed)
Patient ID: [name], female   DOB: [birth year], 63 y.o.   MRN: [id number]   [address], [place]  [address] Phone: [phone] Fax:  [phone]  Date:  01/04/2014   ID:  [name], DOB Nov 29, 1951, MRN [id number]  PCP:  Ailene Ards, CRNA  Cardiologist:  Dr. Jenkins Rouge  Electrophysiologist:  Dr. Thompson Grayer    History of Present Illness: [name] is a 63 y.o. female who is referred to the [facility] for evaluation for advanced therapies.   She has a hx of CAD, s/p prior Ant MI, s/p prior POBA to LAD, Dx, RCA, CHF due to ICM with high scar burden by MRI (12/09), s/p ICD, HTN, HL, depression, tobacco abuse.   ABI 7/14 R 0.93 L 0.95 LHC (10/12):  dLAD 40, pCFX 30, OM1 50, pRCA 30.   MRI (12/09):  High scar burden, Inf and Apical AK, EF 24%.  Echo 10/14 EF 15% global HK. RV mild HK  Mod MR. Severe TR. PA 51mmHG   Lexiscan Myoview (11/14): LV Ejection Fraction: 24%. Extensive scar in the inferior wall, apex and anterior wall. Minimal reversibility    CPX 11/14 FVC 2.08 (67%)  FEV1 1.58 (66%)  FEV1/FVC 76%   Resting HR: 84 Peak HR: 142 (91% age predicted max HR) BP rest: 114/60 BP peak: 148/70 (IPE)  Peak VO2: 15 (63% predicted peak VO2) VE/VCO2 slope: 32 OUES: 0.85 Peak RER: 1.33 Ventilatory Threshold: 12.1 (50.8% predicted peak VO2) VE/MVV: 60.1% O2pulse: 6 (75% predicted O2pulse)  Follow up: Last visit was scheduled for heart cath for persistent CP, however had to cancel d/t she had no ride. Doing pretty good. + dizziness not always associated with position changes. Denies SOB, PND, orthopnea or edema. Can walk around whole grocery store without stopping. Still smoking about 4-6 cigarettes a day.  Optivol interrogated personally. Fluid has now come down to baseline. Activity level 4 hours per day. No VT.   Labs (10/12):  K 3.2, creatinine 0.9, Hgb 13.5 Labs (9/14):    K 3.4, creatinine 1.0, pro BNP 1560, Hgb 11.8, HDL 25,  LDL 60, ALT 29 Labs (10/14):  K 3.3=> 4.2, creatinine 1.2, BNP 1175 Labs (10/02/13) K 3.2 creatinine 0.9 Labs (12/13/13): LDL 134, AST 24, ALT 24, TC 221, TG104,   Wt Readings from Last 3 Encounters:  12/14/13 121 lb (54.885 kg)  11/10/13 120 lb (54.432 kg)  11/01/13 120 lb 12.8 oz (54.795 kg)     Past Medical History  Diagnosis Date  . Ischemic cardiomyopathy     MRI (12/09):  High scar burden, Inf and Apical AK, EF 24%.;  Echo (10/14): EF 15%, Gr 2 DD, mild to mod MR, mod LAE, RVSF mildly reduced, mod RAE, severe TR, PASP 49-53 (mod pulmonary HTN)  . Chronic systolic heart failure   . HTN (hypertension)   . Depression   . MI (myocardial infarction)   . ICD (implantable cardiac defibrillator) in place   . Asthma   . H/O hiatal hernia   . Arthritis     hands  . CAD (coronary artery disease)     a. s/p prior Ant MI, b. s/p prior POBA to LAD, Dx, RCA,;  c.  LHC (10/12):  dLAD 40, pCFX 30, OM1 50, pRCA 30;  d.  [facility] Adam Myoview (11/14):  High risk study; inferior, anterior, apical defects; minimal reversibility toward the apex; consistent with prior infarct and very mild peri-infarct ischemia, EF  24%, inferior/apical/anterior akinesis  . HLD (hyperlipidemia)   . Tobacco abuse     Current Outpatient Prescriptions  Medication Sig Dispense Refill  . aspirin 81 MG tablet Take 81 mg by mouth daily.        . carvedilol (COREG) 6.25 MG tablet Take 1 tablet (6.25 mg total) by mouth 2 (two) times daily.  60 tablet  11  . dexlansoprazole (DEXILANT) 60 MG capsule Take 60 mg by mouth daily.      . digoxin (LANOXIN) 0.125 MG tablet Take 1 tablet (0.125 mg total) by mouth daily.  30 tablet  3  . fexofenadine (ALLEGRA) 180 MG tablet Take 180 mg by mouth daily as needed for allergies.       . fish oil-omega-3 fatty acids 1000 MG capsule Take 2 g by mouth daily.       . fluticasone (FLONASE) 50 MCG/ACT nasal spray Place 2 sprays into the nose daily.      . Fluticasone-Salmeterol (ADVAIR) 100-50  MCG/DOSE AEPB Inhale 1 puff into the lungs every 12 (twelve) hours.      . furosemide (LASIX) 40 MG tablet Take 40-60 mg by mouth 2 (two) times daily. 60 MG IN THE AM AND 40 MG IN THE PM      . PARoxetine (PAXIL) 30 MG tablet Take 30 mg by mouth daily.       . Psyllium (METAMUCIL) 30.9 % POWD Mix 1 tablespoon in 8 ounces of water and drink - twice daily      . ramipril (ALTACE) 10 MG capsule Take 1 capsule (10 mg total) by mouth daily.  30 capsule  12  . rosuvastatin (CRESTOR) 5 MG tablet Take 5 mg by mouth 3 (three) times a week. Take 5mg  on Monday, Wednesday and Friday      . spironolactone (ALDACTONE) 25 MG tablet Take 1 tablet (25 mg total) by mouth daily.  30 tablet  6  . vitamin B-12 (CYANOCOBALAMIN) 250 MCG tablet Take 250 mcg by mouth daily.      . [DISCONTINUED] Budesonide (PULMICORT IN) Inhale into the lungs as directed.        . [DISCONTINUED] omeprazole (PRILOSEC) 10 MG capsule Take 10 mg by mouth daily.         No current facility-administered medications for this encounter.    Allergies:    Allergies  Allergen Reactions  . Statins Other (See Comments)    Gradually tired.    Social History:  The patient  reports that she has been smoking Cigarettes.  She has been smoking about 0.00 packs per day. She has never used smokeless tobacco. She reports that she does not drink alcohol or use illicit drugs.   ROS:  Please see the history of present illness.   All other systems reviewed and negative.   Filed Vitals:   01/04/14 0946  BP: 106/48  Pulse: 54  Weight: 118 lb 6.4 oz (53.706 kg)  SpO2: 100%    PHYSICAL EXAM:  Well nourished, well developed, in no acute distress HEENT: normal Neck: no JVD Cardiac:  normal S1, S2; RRR; 1/6 systolic murmur LLSB Lungs:  Markedly decreased breath sounds bilaterally, long exp phase no rales, no wheezing  Abd: soft, nontender, no hepatomegaly Ext: no edema, cyanosis or clubbing. L DP non palpable. R DP 1+  Skin: warm and dry Neuro:   CNs 2-12 intact, no focal abnormalities noted  ASSESSMENT AND PLAN:  1. Chronic Systolic CHF:  EF 0000000 (99991111) due to ICM with mild  RV dysfunction 2. Hypertension:   3. Hyperlipidemia: 4. CAD:    5. COPD/Tobacco Abuse:   6. S/p AICD:  F/u with EP as directed.  7. Diminished peripheral pulses with normal ABIs 7/14  Discussion:   NYHA II symptoms and volume status stable. Will continue coreg 6.25 mg BID, will not titrate with low HR. On goal dose ramipril 10 mg daily and spironolactone 25 mg daily. Check BMET today. Reinforced the need and importance of daily weights, a low sodium diet, and fluid restriction (less than 2 L a day). Instructed to call the HF clinic if weight increases more than 3 lbs overnight or 5 lbs in a week.   No longer having CP. She was scheduled for LHC late last year, but she had to cancel d/t she did not have a ride. Instructed to call back if she starts having CP again and we can schedule the cath. Continue statin, BB and ACE-I.  Discussed with patient again about trying to quit smoking. Praised her for cutting back, but encouraged to try and quit completely.  Optivol: No crossings over threshold, no afib, Pt activity 2-3 hrs a day.   F/U 3-4 months Rande Brunt NP-C 12:42 PM

## 2014-01-04 NOTE — Patient Instructions (Signed)
Doing well.  Continue all medications.  Continue to try and quit smoking  F/U 3-4 months  Do the following things EVERYDAY: 1) Weigh yourself in the morning before breakfast. Write it down and keep it in a log. 2) Take your medicines as prescribed 3) Eat low salt foods-Limit salt (sodium) to 2000 mg per day.  4) Stay as active as you can everyday 5) Limit all fluids for the day to less than 2 liters

## 2014-01-10 ENCOUNTER — Encounter: Payer: Self-pay | Admitting: *Deleted

## 2014-01-30 ENCOUNTER — Telehealth (HOSPITAL_COMMUNITY): Payer: Self-pay | Admitting: Cardiology

## 2014-01-30 NOTE — Telephone Encounter (Signed)
Spoke w/pt she states her abd is bloated and swollen, denies LE edema, has noticed more SOB with acitivity such as mopping and sweeping, she states wt is pretty stable, was slightly up on weekend but back down now, she is on lasix 60mg  in AM and 20 mg in PM, discussed w/Ali Boyce Medici, NP will have pt take extra 40 mg of lasix tonight, call us back if symptoms not improving, pt aware and agreeable

## 2014-01-30 NOTE — Telephone Encounter (Signed)
Pt called with c/o abdominal swelling and LE edema Pt states she has all the signs of HF Please advsie

## 2014-01-31 ENCOUNTER — Other Ambulatory Visit (HOSPITAL_COMMUNITY): Payer: Self-pay | Admitting: Internal Medicine

## 2014-02-13 ENCOUNTER — Other Ambulatory Visit: Payer: Self-pay | Admitting: *Deleted

## 2014-02-13 ENCOUNTER — Other Ambulatory Visit: Payer: Self-pay

## 2014-02-13 MED ORDER — RAMIPRIL 10 MG PO CAPS: 10.0000 mg | ORAL_CAPSULE | Freq: Every day | ORAL | Status: DC

## 2014-02-13 MED ORDER — DIGOXIN 125 MCG PO TABS: ORAL_TABLET | ORAL | Status: DC

## 2014-03-12 ENCOUNTER — Other Ambulatory Visit: Payer: BC Managed Care – PPO

## 2014-03-14 ENCOUNTER — Other Ambulatory Visit: Payer: BC Managed Care – PPO

## 2014-03-15 ENCOUNTER — Ambulatory Visit: Payer: BC Managed Care – PPO | Admitting: Pharmacist

## 2014-03-26 ENCOUNTER — Other Ambulatory Visit (INDEPENDENT_AMBULATORY_CARE_PROVIDER_SITE_OTHER): Payer: Medicare Other

## 2014-03-26 DIAGNOSIS — E785 Hyperlipidemia, unspecified: Secondary | ICD-10-CM

## 2014-03-26 DIAGNOSIS — Z79899 Other long term (current) drug therapy: Secondary | ICD-10-CM

## 2014-03-26 LAB — LIPID PANEL
Cholesterol: 206 mg/dL — ABNORMAL HIGH (ref 0–200)
HDL: 45.2 mg/dL (ref >39.00)
LDL Cholesterol: 136 mg/dL — ABNORMAL HIGH (ref 0–99)
Total CHOL/HDL Ratio: 5
Triglycerides: 122 mg/dL (ref 0.0–149.0)
VLDL: 24.4 mg/dL (ref 0.0–40.0)

## 2014-03-26 LAB — HEPATIC FUNCTION PANEL
ALBUMIN: 3.6 g/dL (ref 3.5–5.2)
ALT: 18 U/L (ref 0–35)
AST: 20 U/L (ref 0–37)
Alkaline Phosphatase: 88 U/L (ref 39–117)
Bilirubin, Direct: 0 mg/dL (ref 0.0–0.3)
Total Bilirubin: 0.8 mg/dL (ref 0.3–1.2)
Total Protein: 7.3 g/dL (ref 6.0–8.3)

## 2014-03-28 ENCOUNTER — Ambulatory Visit (INDEPENDENT_AMBULATORY_CARE_PROVIDER_SITE_OTHER): Payer: Medicare Other | Admitting: Pharmacist

## 2014-03-28 DIAGNOSIS — Z79899 Other long term (current) drug therapy: Secondary | ICD-10-CM

## 2014-03-28 DIAGNOSIS — I2510 Atherosclerotic heart disease of native coronary artery without angina pectoris: Secondary | ICD-10-CM

## 2014-03-28 DIAGNOSIS — I10 Essential (primary) hypertension: Secondary | ICD-10-CM

## 2014-03-28 DIAGNOSIS — I219 Acute myocardial infarction, unspecified: Secondary | ICD-10-CM

## 2014-03-28 DIAGNOSIS — E785 Hyperlipidemia, unspecified: Secondary | ICD-10-CM

## 2014-03-28 MED ORDER — ROSUVASTATIN CALCIUM 5 MG PO TABS: 5.0000 mg | ORAL_TABLET | Freq: Every day | ORAL | Status: DC

## 2014-03-28 NOTE — Assessment & Plan Note (Addendum)
Will slowly increase Crestor 5 mg tiw up to 5 mg daily over the next few months.  If able to tolerate Crestor 5 mg qd, and either CRP is elevated, or HDL is low in 3 months, she may also qualify for SPIRE clinical trial.  We discussed the study today and she is interested in this.  Need to get her on a daily Crestor dose first if possible.  Will titrate slowly adding 1 extra tablet weekly, every month. She will start with Crestor 4 tablets per week today.  If she develops side effects, she will reduce dose 1 tablet per week.  She will continue to try and limit cigarette use.  Will recheck INR in 3-4 months, and hopefully she'll be on daily Crestor 5 mg qd at that time. Plan: 1.  Will slowly increase Crestor 5 mg dosage.     -Starting today, increase to Crestor 5 mg - four times per week. -In one month, increase to five times per week. - in 2 months, increase to six times per week. - in 3 months, increase to 7 days per week. 2.  Continue all other medications. 3.  In 3-4 months will recheck labs (lipid/liver/CRP) on 07/10/14 (fasting labs, show up anytime after 7:30 am), and see Ysidro Evert 2 days later on 07/12/14 at 10:30 am.  Getting a CRP and not hs-CRP due to her insurance coverage, and if elevated will hopefully enroll into SPIRE study.

## 2014-03-28 NOTE — Patient Instructions (Signed)
1.  Will slowly increase Crestor 5 mg dosage.   -Starting today, increase to Crestor 5 mg - four times per week. -In one month, increase to five times per week. - in 2 months, increase to six times per week. - in 3 months, increase to 7 days per week. 2.  Continue all other medications. 3.  In 3-4 months will recheck labs (07/10/14 - fasting labs, show up anytime after 7:30 am), and see Ysidro Evert 2 days later on 07/12/14 at 10:30 am

## 2014-03-28 NOTE — Progress Notes (Signed)
HPI  [name] is a 63 yo pt referred by Dr. Johnsie Cancel for statin intolerance.  She has a history of ischemic cardiomyopathy and stents to LAD and RCA in 2007.  Has history of elevated cholesterol.  Was first placed on Lipitor 20mg  and tolerated with no problems until dose was increased to 40mg  daily.  She then had problems with muscle weakness and lack of energy.  She was switched to Crestor 10 mg qd with same effects and most recently switched back to Lipitor with no success.  She tells me today that she tried Zetia in the past, and had to stop as this caused muscle aches similar to these statins as well.   Last year we restarted Crestor at 5mg  on Monday, Wednesday and Friday. She has tolerated this with no issues other than slight muscle pains which she isn't positive is due to statin.  LDL dropped from 184 mg/dL to 60 mg/dL from 06/2013 to 08/2013 on Crestor initially, and now LDL is up to ~ 135mg /dL despite patient stating she has been compliant with this agent.  LDL still down from 184 mg/dL to 134 mg/dL on Crestor 5 mg tiw over past 9 months, which is an expected reduction on this type of dosage.  Her cholesterol is stable over past 3 months on Crestor 5 mg tiw - we discussed increasing this dose further today, and she is agreeable to this as long as we do it slowly as not hopefully not develop side effects.  Patient has CAD > 5 years and is a smoker.  If she has a low HDL or elevated CRP in future, she may qualify for PCSK-9 inhibitor study.  We discussed this in clinic today.  Reviewed pt's diet.  She is trying to make more improvements.  She has increased her fiber through fiber bars and fruit and vegetables.  She typically eats breakfast, then a later lunch (3 pm).  Rarely eats an evening meal.  She tells me she has a h/o constipation which she has to use Miralax periodically for.  She admits to smoking 6 cigarettes daily, and is having a hard time smoking less than this.  She has failed nicotine  replacement therapy.  She has thought about the E-cigarette, but hasn't tried it yet.  Pt is currently not exercising.  She is limited by her heart failure. She had a habit of walking with her daughter on a regular basis but her daughter moved and so she stopped walking.  She does have a elipical in her home as well.  She babysits her 2 grandchidren (age 40 and 61) 1 day of the week.    Intolerant:  Daily Crestor 10 mg qd, daily lipitor 20-40 mg qd, Zetia (muscle and joint aches)  Labs:   03/2014 - TC 206, LDL 136, TG 122, HDL 45 (Crestor 5 mg tiw, fish oil 2 g/d, metamucil once daily) 12/2013 - TC 221, LDL 134, TG 104, HDL 55  (Crestor 5 mg tiw, fish oil 2 g/d) 08/2013 - TC 100, LDL 60 (Crestor 5 mg tiw, fish oil 2 g/d) 06/2013 - TC 258, LDL 184 (fish oil only)  Current Outpatient Prescriptions on File Prior to Visit  Medication Sig Dispense Refill  . aspirin 81 MG tablet Take 81 mg by mouth daily.        . carvedilol (COREG) 6.25 MG tablet Take 1 tablet (6.25 mg total) by mouth 2 (two) times daily.  60 tablet  11  . dexlansoprazole (DEXILANT)  60 MG capsule Take 60 mg by mouth daily.      . digoxin (LANOXIN) 0.125 MG tablet TAKE ONE TABLET BY MOUTH DAILY  30 tablet  4  . fexofenadine (ALLEGRA) 180 MG tablet Take 180 mg by mouth daily as needed for allergies.       . fish oil-omega-3 fatty acids 1000 MG capsule Take 2 g by mouth daily.       . fluticasone (FLONASE) 50 MCG/ACT nasal spray Place 2 sprays into the nose daily.      . Fluticasone-Salmeterol (ADVAIR) 100-50 MCG/DOSE AEPB Inhale 1 puff into the lungs every 12 (twelve) hours.      . furosemide (LASIX) 40 MG tablet Take 40-60 mg by mouth 2 (two) times daily. 60 MG IN THE AM AND 40 MG IN THE PM      . PARoxetine (PAXIL) 30 MG tablet Take 30 mg by mouth daily.       . Psyllium (METAMUCIL) 30.9 % POWD Mix 1 tablespoon in 8 ounces of water and drink - twice daily      . ramipril (ALTACE) 10 MG capsule Take 1 capsule (10 mg total) by mouth  daily.  30 capsule  3  . rosuvastatin (CRESTOR) 5 MG tablet Take 5 mg by mouth 3 (three) times a week. Take 5mg  on Monday, Wednesday and Friday      . spironolactone (ALDACTONE) 25 MG tablet Take 1 tablet (25 mg total) by mouth daily.  30 tablet  6  . vitamin B-12 (CYANOCOBALAMIN) 250 MCG tablet Take 250 mcg by mouth daily.      . [DISCONTINUED] Budesonide (PULMICORT IN) Inhale into the lungs as directed.        . [DISCONTINUED] omeprazole (PRILOSEC) 10 MG capsule Take 10 mg by mouth daily.         No current facility-administered medications on file prior to visit.   Allergies  Allergen Reactions  . Statins Other (See Comments)    Gradually tired.

## 2014-05-02 ENCOUNTER — Other Ambulatory Visit: Payer: Self-pay | Admitting: *Deleted

## 2014-05-02 MED ORDER — FUROSEMIDE 40 MG PO TABS: 40.0000 mg | ORAL_TABLET | Freq: Two times a day (BID) | ORAL | Status: DC

## 2014-05-09 ENCOUNTER — Encounter: Payer: Self-pay | Admitting: Cardiology

## 2014-05-10 ENCOUNTER — Telehealth: Payer: Self-pay | Admitting: Internal Medicine

## 2014-05-10 NOTE — Telephone Encounter (Signed)
[id number] sent certified letter regarding past due device check/mt

## 2014-05-31 ENCOUNTER — Other Ambulatory Visit: Payer: Self-pay | Admitting: Internal Medicine

## 2014-06-04 ENCOUNTER — Other Ambulatory Visit: Payer: Self-pay | Admitting: *Deleted

## 2014-06-04 MED ORDER — RAMIPRIL 10 MG PO CAPS: 10.0000 mg | ORAL_CAPSULE | Freq: Every day | ORAL | Status: DC

## 2014-06-06 ENCOUNTER — Encounter (HOSPITAL_COMMUNITY): Payer: Self-pay | Admitting: Cardiology

## 2014-06-25 ENCOUNTER — Telehealth: Payer: Self-pay | Admitting: Cardiovascular Disease

## 2014-07-03 ENCOUNTER — Other Ambulatory Visit: Payer: Self-pay | Admitting: Physician Assistant

## 2014-07-03 ENCOUNTER — Other Ambulatory Visit: Payer: Self-pay | Admitting: Internal Medicine

## 2014-07-05 ENCOUNTER — Other Ambulatory Visit: Payer: Self-pay

## 2014-07-05 MED ORDER — RAMIPRIL 10 MG PO CAPS: 10.0000 mg | ORAL_CAPSULE | Freq: Every day | ORAL | Status: DC

## 2014-07-10 ENCOUNTER — Other Ambulatory Visit (INDEPENDENT_AMBULATORY_CARE_PROVIDER_SITE_OTHER): Payer: Medicare Other

## 2014-07-10 DIAGNOSIS — Z79899 Other long term (current) drug therapy: Secondary | ICD-10-CM

## 2014-07-10 DIAGNOSIS — I2510 Atherosclerotic heart disease of native coronary artery without angina pectoris: Secondary | ICD-10-CM

## 2014-07-10 DIAGNOSIS — E785 Hyperlipidemia, unspecified: Secondary | ICD-10-CM

## 2014-07-10 DIAGNOSIS — I219 Acute myocardial infarction, unspecified: Secondary | ICD-10-CM

## 2014-07-10 LAB — C-REACTIVE PROTEIN

## 2014-07-10 LAB — HEPATIC FUNCTION PANEL
ALT: 19 U/L (ref 0–35)
AST: 23 U/L (ref 0–37)
Albumin: 3.8 g/dL (ref 3.5–5.2)
Alkaline Phosphatase: 86 U/L (ref 39–117)
BILIRUBIN DIRECT: 0 mg/dL (ref 0.0–0.3)
TOTAL PROTEIN: 7.2 g/dL (ref 6.0–8.3)
Total Bilirubin: 0.8 mg/dL (ref 0.2–1.2)

## 2014-07-10 LAB — LIPID PANEL
Cholesterol: 219 mg/dL — ABNORMAL HIGH (ref 0–200)
HDL: 45.3 mg/dL (ref >39.00)
LDL Cholesterol: 147 mg/dL — ABNORMAL HIGH (ref 0–99)
NonHDL: 173.7
Total CHOL/HDL Ratio: 5
Triglycerides: 135 mg/dL (ref 0.0–149.0)
VLDL: 27 mg/dL (ref 0.0–40.0)

## 2014-07-12 ENCOUNTER — Ambulatory Visit (INDEPENDENT_AMBULATORY_CARE_PROVIDER_SITE_OTHER): Payer: Medicare Other | Admitting: Pharmacist

## 2014-07-12 VITALS — Wt 118.0 lb

## 2014-07-12 DIAGNOSIS — I2510 Atherosclerotic heart disease of native coronary artery without angina pectoris: Secondary | ICD-10-CM

## 2014-07-12 DIAGNOSIS — Z79899 Other long term (current) drug therapy: Secondary | ICD-10-CM

## 2014-07-12 DIAGNOSIS — E785 Hyperlipidemia, unspecified: Secondary | ICD-10-CM

## 2014-07-12 MED ORDER — EZETIMIBE 10 MG PO TABS: 5.0000 mg | ORAL_TABLET | Freq: Every day | ORAL | Status: DC

## 2014-07-12 NOTE — Assessment & Plan Note (Signed)
Cholesterol has not improved despite increasing Crestor 5 mg dose slightly (tiw up to qiw).  Can't tolerate higher dose of Crestor, and doesn't want to change to a different statin at this time.  Already has issues with constipation so will not use a BAS at this time.  Failed Zetia 10 mg qd in past, but is willing to try Zetia 5 mg qd given this can still give 15% LDL reduction and is tolerated better than the 10 mg dose.  This is likely going to be the most potent regimen [name] can tolerate given PCSK-9 inhibitor not covered by her medicare at this time.  Recheck blood work in 3 months.  Zetia samples given today in office.

## 2014-07-12 NOTE — Progress Notes (Signed)
HPI  [name] is a 63 yo pt referred by Dr. Johnsie Cancel for statin intolerance.  She has a history of ischemic cardiomyopathy and stents to LAD and RCA in 2007.  Has history of elevated cholesterol.  Currently on Crestor 5 mg four times per week, as she couldn't handle Crestor 5 mg fives per week or higher.  Was first placed on Lipitor 20mg  and tolerated with no problems until dose was increased to 40mg  daily.  She then had problems with muscle weakness and lack of energy.  She was switched to Crestor 10 mg qd with same effects and most recently switched back to Lipitor with no success.  She tells me today that she tried Zetia in the past, and had to stop as this caused muscle aches similar to these statins as well.   LDL dropped from 184 mg/dL to 60 mg/dL from 06/2013 to 08/2013 on Crestor initially, then LDL went up to 136 mg/dL despite patient stating she has been compliant with Crestor 5 mg tiw, and now LDL up to 147 mg/dL on Crestor 5 mg qiw and compliant.   LDL still down from 184 mg/dL to 135-145 mg/dL on Crestor 5 mg qiw which is an expected reduction on this type of dosage. Patient has CAD > 5 years and is a smoker. She was interested in PCSK-9 inhibitor study, so we checked a CRP today, however the levels were undetectable, and HDL is > 40, so she doesn't qualify for study drug.  She does not have a family history of premature CAD.   Reviewed pt's diet.  She is trying to make more improvements.  She has increased her fiber through fiber bars and fruit and vegetables.  She typically eats breakfast, then a later lunch (3 pm).  Rarely eats an evening meal.  She tells me she has a h/o constipation which she has to use Miralax periodically for.  She admits to smoking 6 cigarettes daily, and is having a hard time smoking less than this.  She has failed nicotine replacement therapy.  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  Pt is currently not exercising.  She is limited by her heart  failure. She had a habit of walking with her daughter on a regular basis but her daughter moved and so she stopped walking.  She does have a elipical in her home as well.  She babysits her 2 grandchidren (age 56 and 69) 1 day of the week.    Intolerant:  Daily Crestor 10 mg qd, Crestor 5 mg five times per week, daily lipitor 20-40 mg qd, Zetia 10 mg qd (muscle and joint aches)  Labs:   07/2014 - TC 219, LDL 147, TG 135, HDL 45 (Crestor 5 mg qiw, fish oil 2 g/d) 03/2014 - TC 206, LDL 136, TG 122, HDL 45 (Crestor 5 mg tiw, fish oil 2 g/d, metamucil once daily) 12/2013 - TC 221, LDL 134, TG 104, HDL 55  (Crestor 5 mg tiw, fish oil 2 g/d) 08/2013 - TC 100, LDL 60 (Crestor 5 mg tiw, fish oil 2 g/d) 06/2013 - TC 258, LDL 184 (fish oil only)  Current Outpatient Prescriptions on File Prior to Visit  Medication Sig Dispense Refill  . aspirin 81 MG tablet Take 81 mg by mouth daily.        . carvedilol (COREG) 6.25 MG tablet Take 1 tablet (6.25 mg total) by mouth 2 (two) times daily.  60 tablet  11  . dexlansoprazole (DEXILANT)  60 MG capsule Take 60 mg by mouth daily.      . digoxin (LANOXIN) 0.125 MG tablet TAKE ONE TABLET BY MOUTH DAILY  30 tablet  4  . fexofenadine (ALLEGRA) 180 MG tablet Take 180 mg by mouth daily as needed for allergies.       . fish oil-omega-3 fatty acids 1000 MG capsule Take 2 g by mouth daily.       . fluticasone (FLONASE) 50 MCG/ACT nasal spray Place 2 sprays into the nose daily.      . Fluticasone-Salmeterol (ADVAIR) 100-50 MCG/DOSE AEPB Inhale 1 puff into the lungs every 12 (twelve) hours.      . furosemide (LASIX) 40 MG tablet TAKE 1 AND 1/2 TABLETS IN THE MORNING AND 1 TABLET IN THE EVENING  75 tablet  3  . PARoxetine (PAXIL) 30 MG tablet Take 30 mg by mouth daily.       . Psyllium (METAMUCIL) 30.9 % POWD Mix 1 tablespoon in 8 ounces of water and drink - twice daily      . ramipril (ALTACE) 10 MG capsule Take 1 capsule (10 mg total) by mouth daily.  15 capsule  0  .  spironolactone (ALDACTONE) 25 MG tablet TAKE 1 TABLET BY MOUTH EVERY DAY  30 tablet  0  . vitamin B-12 (CYANOCOBALAMIN) 250 MCG tablet Take 250 mcg by mouth daily.      . [DISCONTINUED] Budesonide (PULMICORT IN) Inhale into the lungs as directed.        . [DISCONTINUED] omeprazole (PRILOSEC) 10 MG capsule Take 10 mg by mouth daily.         No current facility-administered medications on file prior to visit.   Allergies  Allergen Reactions  . Statins Other (See Comments)    Gradually tired with daily Lipitor and Crestor 5 mg more than 4 times per week

## 2014-07-12 NOTE — Patient Instructions (Signed)
1.  Continue Crestor 5 mg 3-4 times per week. 2.  Start Zetia 1/2 tablet daily (1/2 of 10 mg pill) 3.  Recheck cholesterol and liver panel in 3 months (10/09/14 - fasting labs, lab opens at 7:30 am), and see Ysidro Evert 2 days later on 10/11/14 at 10:30  [employer] - [phone]

## 2014-07-18 ENCOUNTER — Encounter (HOSPITAL_COMMUNITY): Payer: Medicare Other

## 2014-07-25 ENCOUNTER — Ambulatory Visit (HOSPITAL_BASED_OUTPATIENT_CLINIC_OR_DEPARTMENT_OTHER)
Admission: RE | Admit: 2014-07-25 | Discharge: 2014-07-25 | Disposition: A | Discharge status: Home / Self Care | Payer: Medicare Other | Source: Ambulatory Visit | Attending: Internal Medicine | Admitting: Internal Medicine

## 2014-07-25 ENCOUNTER — Encounter (HOSPITAL_COMMUNITY): Payer: Self-pay | Admitting: *Deleted

## 2014-07-25 ENCOUNTER — Telehealth (HOSPITAL_COMMUNITY): Payer: Self-pay | Admitting: Cardiology

## 2014-07-25 ENCOUNTER — Other Ambulatory Visit: Payer: Self-pay | Admitting: *Deleted

## 2014-07-25 ENCOUNTER — Encounter (HOSPITAL_COMMUNITY): Payer: Self-pay | Admitting: Pharmacy Technician

## 2014-07-25 VITALS — BP 108/54 | HR 47 | Wt 120.8 lb

## 2014-07-25 DIAGNOSIS — I252 Old myocardial infarction: Secondary | ICD-10-CM | POA: Diagnosis not present

## 2014-07-25 DIAGNOSIS — I2510 Atherosclerotic heart disease of native coronary artery without angina pectoris: Secondary | ICD-10-CM | POA: Diagnosis not present

## 2014-07-25 DIAGNOSIS — R072 Precordial pain: Secondary | ICD-10-CM | POA: Insufficient documentation

## 2014-07-25 DIAGNOSIS — F3289 Other specified depressive episodes: Secondary | ICD-10-CM | POA: Diagnosis not present

## 2014-07-25 DIAGNOSIS — M79609 Pain in unspecified limb: Secondary | ICD-10-CM

## 2014-07-25 DIAGNOSIS — I5022 Chronic systolic (congestive) heart failure: Secondary | ICD-10-CM | POA: Diagnosis not present

## 2014-07-25 DIAGNOSIS — F329 Major depressive disorder, single episode, unspecified: Secondary | ICD-10-CM | POA: Diagnosis not present

## 2014-07-25 DIAGNOSIS — Z95810 Presence of automatic (implantable) cardiac defibrillator: Secondary | ICD-10-CM | POA: Diagnosis not present

## 2014-07-25 DIAGNOSIS — Z7982 Long term (current) use of aspirin: Secondary | ICD-10-CM | POA: Diagnosis not present

## 2014-07-25 DIAGNOSIS — J45909 Unspecified asthma, uncomplicated: Secondary | ICD-10-CM | POA: Diagnosis not present

## 2014-07-25 DIAGNOSIS — Z9861 Coronary angioplasty status: Secondary | ICD-10-CM | POA: Diagnosis not present

## 2014-07-25 DIAGNOSIS — I509 Heart failure, unspecified: Secondary | ICD-10-CM | POA: Diagnosis not present

## 2014-07-25 DIAGNOSIS — E785 Hyperlipidemia, unspecified: Secondary | ICD-10-CM | POA: Diagnosis not present

## 2014-07-25 DIAGNOSIS — R5383 Other fatigue: Secondary | ICD-10-CM

## 2014-07-25 DIAGNOSIS — R079 Chest pain, unspecified: Secondary | ICD-10-CM | POA: Diagnosis present

## 2014-07-25 DIAGNOSIS — M79606 Pain in leg, unspecified: Secondary | ICD-10-CM

## 2014-07-25 DIAGNOSIS — I2584 Coronary atherosclerosis due to calcified coronary lesion: Secondary | ICD-10-CM | POA: Diagnosis not present

## 2014-07-25 DIAGNOSIS — I498 Other specified cardiac arrhythmias: Secondary | ICD-10-CM | POA: Diagnosis not present

## 2014-07-25 DIAGNOSIS — R5381 Other malaise: Secondary | ICD-10-CM

## 2014-07-25 DIAGNOSIS — I2589 Other forms of chronic ischemic heart disease: Secondary | ICD-10-CM | POA: Diagnosis not present

## 2014-07-25 DIAGNOSIS — R0989 Other specified symptoms and signs involving the circulatory and respiratory systems: Secondary | ICD-10-CM | POA: Diagnosis not present

## 2014-07-25 DIAGNOSIS — I739 Peripheral vascular disease, unspecified: Secondary | ICD-10-CM | POA: Diagnosis not present

## 2014-07-25 DIAGNOSIS — I10 Essential (primary) hypertension: Secondary | ICD-10-CM | POA: Diagnosis not present

## 2014-07-25 DIAGNOSIS — I2789 Other specified pulmonary heart diseases: Secondary | ICD-10-CM | POA: Diagnosis not present

## 2014-07-25 DIAGNOSIS — F17200 Nicotine dependence, unspecified, uncomplicated: Secondary | ICD-10-CM | POA: Diagnosis not present

## 2014-07-25 LAB — CBC
HEMATOCRIT: 36.3 % (ref 36.0–46.0)
HEMOGLOBIN: 12.8 g/dL (ref 12.0–15.0)
MCH: 33.9 pg (ref 26.0–34.0)
MCHC: 35.3 g/dL (ref 30.0–36.0)
MCV: 96 fL (ref 78.0–100.0)
PLATELETS: 204 10*3/uL (ref 150–400)
RBC: 3.78 MIL/uL — ABNORMAL LOW (ref 3.87–5.11)
RDW: 13.4 % (ref 11.5–15.5)
WBC: 9.1 10*3/uL (ref 4.0–10.5)

## 2014-07-25 LAB — DIGOXIN LEVEL: Digoxin Level: 2 ng/mL (ref 0.8–2.0)

## 2014-07-25 LAB — BASIC METABOLIC PANEL
Anion gap: 13 (ref 5–15)
BUN: 22 mg/dL (ref 6–23)
CALCIUM: 9.9 mg/dL (ref 8.4–10.5)
CO2: 25 meq/L (ref 19–32)
CREATININE: 1.1 mg/dL (ref 0.50–1.10)
Chloride: 95 mEq/L — ABNORMAL LOW (ref 96–112)
GFR calc Af Amer: 60 mL/min — ABNORMAL LOW (ref >90)
GFR calc non Af Amer: 52 mL/min — ABNORMAL LOW (ref >90)
GLUCOSE: 117 mg/dL — AB (ref 70–99)
Potassium: 5.1 mEq/L (ref 3.7–5.3)
Sodium: 133 mEq/L — ABNORMAL LOW (ref 137–147)

## 2014-07-25 LAB — PROTIME-INR
INR: 0.98 (ref 0.00–1.49)
PROTHROMBIN TIME: 13 s (ref 11.6–15.2)

## 2014-07-25 LAB — TROPONIN I: Troponin I: <0.3 ng/mL (ref <0.30)

## 2014-07-25 LAB — TSH: TSH: 1.63 u[IU]/mL (ref 0.350–4.500)

## 2014-07-25 NOTE — Telephone Encounter (Signed)
Pt scheduled for LHC on 07/26/2014 Cpt code 93458 icd 9- abnormal ECG With pts current insurance- Medicare A and B No pre cert req'd

## 2014-07-25 NOTE — Patient Instructions (Addendum)
Stop Digoxin  Stop Carvedilol  Labs today  Your physician has requested that you have a cardiac catheterization. Cardiac catheterization is used to diagnose and/or treat various heart conditions. Doctors may recommend this procedure for a number of different reasons. The most common reason is to evaluate chest pain. Chest pain can be a symptom of coronary artery disease (CAD), and cardiac catheterization can show whether plaque is narrowing or blocking your heart's arteries. This procedure is also used to evaluate the valves, as well as measure the blood flow and oxygen levels in different parts of your heart. For further information please visit [email or web address]. Please follow instruction sheet, as given.  SCHEDULED FOR Thursday 07/26/14, SEE INSTRUCTION SHEET  Your physician has requested that you have a carotid duplex. This test is an ultrasound of the carotid arteries in your neck. It looks at blood flow through these arteries that supply the brain with blood. Allow one hour for this exam. There are no restrictions or special instructions.  You have been referred to Dr Fletcher Anon for PAD (peripheral artery disease)  Your physician recommends that you schedule a follow-up appointment in: 1 month

## 2014-07-25 NOTE — Progress Notes (Signed)
Patient ID: [name], female   DOB: 03-30-1951, 63 y.o.   MRN: [id number]   [address], [address] [place], [place]  [address] Phone: [phone] Fax:  [phone]  Date:  07/25/2014   ID:  [name], DOB 1951-02-22, MRN [id number]  PCP:  Ailene Ards, CRNA  Cardiologist:  Dr. Jenkins Rouge  Electrophysiologist:  Dr. Thompson Grayer    History of Present Illness: [name] is a 63 y.o. female who is referred to the [facility] for evaluation for advanced therapies.   She has a hx of CAD, s/p prior Ant MI, s/p prior POBA to LAD, Dx, RCA, CHF due to ICM with high scar burden by MRI (12/09), s/p ICD, HTN, HL, depression, tobacco abuse.   ABI 7/14 R 0.93 L 0.95 LHC (10/12):  dLAD 40, pCFX 30, OM1 50, pRCA 30.   MRI (12/09):  High scar burden, Inf and Apical AK, EF 24%.  Echo 10/14 EF 15% global HK. RV mild HK  Mod MR. Severe TR. PA 70mmHG   Lexiscan Myoview (11/14): LV Ejection Fraction: 24%. Extensive scar in the inferior wall, apex and anterior wall. Minimal reversibility    CPX 11/14 FVC 2.08 (67%)  FEV1 1.58 (66%)  FEV1/FVC 76%   Resting HR: 84 Peak HR: 142 (91% age predicted max HR) BP rest: 114/60 BP peak: 148/70 (IPE)  Peak VO2: 15 (63% predicted peak VO2) VE/VCO2 slope: 32 OUES: 0.85 Peak RER: 1.33 Ventilatory Threshold: 12.1 (50.8% predicted peak VO2) VE/MVV: 60.1% O2pulse: 6 (75% predicted O2pulse)  Follow up: Earlier in the year was scheduled for heart cath for persistent CP, however had to cancel d/t she had no ride. Recently had sinus infection and start on abx on Friday by PCP. [place] [address] and she cuts grass and does all yard work. Occasional mild exertional chest heaviness which is slightly more frequent. No resting angina. Also c/o bilateral LE claudication. Mildly progressive can walk a few hundred yards. No rest pain or ulceration. Feels weak and foggy headed. No syncop. Denies SOB, PND, orthopnea or edema. Still smoking about 6  cigarettes a day. Weight stable 118-120. Working with lipid clinic to tolerate statins and zetia.  Optivol interrogated personally. Fluid has now come down to baseline. Activity level 4 hours per day. No VT.   Labs (10/12):  K 3.2, creatinine 0.9, Hgb 13.5 Labs (9/14):    K 3.4, creatinine 1.0, pro BNP 1560, Hgb 11.8, HDL 25, LDL 60, ALT 29 Labs (10/14):  K 3.3=> 4.2, creatinine 1.2, BNP 1175 Labs (10/02/13) K 3.2 creatinine 0.9 Labs (12/13/13): LDL 134, AST 24, ALT 24, TC 221, TG104,   Wt Readings from Last 3 Encounters:  07/25/14 120 lb 12 oz (54.772 kg)  07/12/14 118 lb (53.524 kg)  01/04/14 118 lb 6.4 oz (53.706 kg)     Past Medical History  Diagnosis Date  . Ischemic cardiomyopathy     MRI (12/09):  High scar burden, Inf and Apical AK, EF 24%.;  Echo (10/14): EF 15%, Gr 2 DD, mild to mod MR, mod LAE, RVSF mildly reduced, mod RAE, severe TR, PASP 49-53 (mod pulmonary HTN)  . Chronic systolic heart failure   . HTN (hypertension)   . Depression   . MI (myocardial infarction)   . ICD (implantable cardiac defibrillator) in place   . Asthma   . H/O hiatal hernia   . Arthritis     hands  . CAD (coronary artery disease)  a. s/p prior Ant MI, b. s/p prior POBA to LAD, Dx, RCA,;  c.  LHC (10/12):  dLAD 40, pCFX 30, OM1 50, pRCA 30;  d.  Carlton Adam Myoview (11/14):  High risk study; inferior, anterior, apical defects; minimal reversibility toward the apex; consistent with prior infarct and very mild peri-infarct ischemia, EF 24%, inferior/apical/anterior akinesis  . HLD (hyperlipidemia)   . Tobacco abuse     Current Outpatient Prescriptions  Medication Sig Dispense Refill  . amoxicillin-clavulanate (AUGMENTIN) 875-125 MG per tablet Take 1 tablet by mouth 2 (two) times daily.      Marland Kitchen aspirin 81 MG tablet Take 81 mg by mouth daily.        . carvedilol (COREG) 6.25 MG tablet Take 1 tablet (6.25 mg total) by mouth 2 (two) times daily.  60 tablet  11  . dexlansoprazole (DEXILANT) 60 MG  capsule Take 60 mg by mouth daily.      . digoxin (LANOXIN) 0.125 MG tablet TAKE ONE TABLET BY MOUTH DAILY  30 tablet  4  . ezetimibe (ZETIA) 10 MG tablet Take 5 mg by mouth every other day.      . fexofenadine (ALLEGRA) 180 MG tablet Take 180 mg by mouth daily as needed for allergies.       . fish oil-omega-3 fatty acids 1000 MG capsule Take 2 g by mouth daily.       . fluticasone (FLONASE) 50 MCG/ACT nasal spray Place 2 sprays into the nose daily.      . Fluticasone-Salmeterol (ADVAIR) 100-50 MCG/DOSE AEPB Inhale 1 puff into the lungs every 12 (twelve) hours.      . furosemide (LASIX) 40 MG tablet TAKE 1 AND 1/2 TABLETS IN THE MORNING AND 1 TABLET IN THE EVENING  75 tablet  3  . PARoxetine (PAXIL) 30 MG tablet Take 30 mg by mouth daily.       . Psyllium (METAMUCIL) 30.9 % POWD Mix 1 tablespoon in 8 ounces of water and drink - twice daily      . ramipril (ALTACE) 10 MG capsule Take 1 capsule (10 mg total) by mouth daily.  15 capsule  0  . rosuvastatin (CRESTOR) 5 MG tablet Take 5 mg by mouth 4 (four) times a week.      . spironolactone (ALDACTONE) 25 MG tablet TAKE 1 TABLET BY MOUTH EVERY DAY  30 tablet  0  . vitamin B-12 (CYANOCOBALAMIN) 250 MCG tablet Take 250 mcg by mouth daily.      . [DISCONTINUED] Budesonide (PULMICORT IN) Inhale into the lungs as directed.        . [DISCONTINUED] omeprazole (PRILOSEC) 10 MG capsule Take 10 mg by mouth daily.         No current facility-administered medications for this encounter.    Allergies:    Allergies  Allergen Reactions  . Statins Other (See Comments)    Gradually tired with daily Lipitor and Crestor 5 mg more than 4 times per week    Social History:  The patient  reports that she has been smoking Cigarettes.  She has been smoking about 0.00 packs per day. She has never used smokeless tobacco. She reports that she does not drink alcohol or use illicit drugs.   ROS:  Please see the history of present illness.   All other systems reviewed and  negative.   Filed Vitals:   07/25/14 1055  BP: 108/54  Pulse: 47  Weight: 120 lb 12 oz (54.772 kg)  SpO2: 98%  PHYSICAL EXAM:  Well nourished, well developed, in no acute distress HEENT: normal Neck: no JVD Cardiac:  normal S1, S2; brady regular; 1/6 systolic murmur LLSB Lungs:  Markedly decreased breath sounds bilaterally, long exp phase no rales, no wheezing  Abd: soft, nontender, no hepatomegaly Ext: no edema, cyanosis or clubbing. L DP non palpable. R DP 1+  Skin: warm and dry Neuro:  CNs 2-12 intact, no focal abnormalities noted  ECG: Marked sinus brady 42. Anterolateral TWI - much more pronounced since previous  ASSESSMENT AND PLAN:  1. CAD with progressive exertional chest pain and abnormal ECG: 2.  Chronic Systolic CHF:  EF 0000000 (99991111) due to ICM with mild RV dysfunction s/p Medtronic ICD 3. COPD with ongoing tobacco abuse 4. Intermittent claudication and diminished peripheral pulses with normal ABIs 7/14 5. Bilateral carotid bruits 6. Symptomatic bradycardia 7. HTN 8. Hyperlipidemia   Discussion:   She is having progressive exertional chest pressure with anterior TWI which is worse from previous. She is not having any resting angina. She also has marked sinus bradycardia with HRs in low 40s. I have suggested admission for cardiac cath but she refuses. Will check labs today including troponin, BMET and digoxin level. Have arranged for outpatient cath tomorrow. She will call 911 if has recurrent CP. Hold digoxin and carvedilol. Will u/s carotids and refer to Dr. Fletcher Anon for further evaluation of claudication. Discussed need for smoking cessation.   Total time spent 45 minutes. Over half that time spent discussing above.    Glori Bickers MD 11:08 AM  Labs reviewed. Trop ok. K 5.1. Digoxin 2.0 (dig has been stopped)  Benay Spice 3:34 PM

## 2014-07-26 ENCOUNTER — Other Ambulatory Visit: Payer: Self-pay | Admitting: *Deleted

## 2014-07-26 ENCOUNTER — Encounter (HOSPITAL_COMMUNITY)
Admission: RE | Disposition: A | Discharge status: Home / Self Care | Payer: Self-pay | Source: Ambulatory Visit | Attending: Internal Medicine

## 2014-07-26 ENCOUNTER — Ambulatory Visit (HOSPITAL_COMMUNITY)
Admission: RE | Admit: 2014-07-26 | Discharge: 2014-07-26 | Disposition: A | Discharge status: Home / Self Care | Payer: Medicare Other | Source: Ambulatory Visit | Attending: Internal Medicine | Admitting: Internal Medicine

## 2014-07-26 DIAGNOSIS — I10 Essential (primary) hypertension: Secondary | ICD-10-CM | POA: Insufficient documentation

## 2014-07-26 DIAGNOSIS — I739 Peripheral vascular disease, unspecified: Secondary | ICD-10-CM | POA: Insufficient documentation

## 2014-07-26 DIAGNOSIS — I509 Heart failure, unspecified: Secondary | ICD-10-CM | POA: Insufficient documentation

## 2014-07-26 DIAGNOSIS — E785 Hyperlipidemia, unspecified: Secondary | ICD-10-CM | POA: Insufficient documentation

## 2014-07-26 DIAGNOSIS — I2510 Atherosclerotic heart disease of native coronary artery without angina pectoris: Secondary | ICD-10-CM

## 2014-07-26 DIAGNOSIS — F3289 Other specified depressive episodes: Secondary | ICD-10-CM | POA: Insufficient documentation

## 2014-07-26 DIAGNOSIS — Z7982 Long term (current) use of aspirin: Secondary | ICD-10-CM | POA: Insufficient documentation

## 2014-07-26 DIAGNOSIS — J45909 Unspecified asthma, uncomplicated: Secondary | ICD-10-CM | POA: Insufficient documentation

## 2014-07-26 DIAGNOSIS — Z95810 Presence of automatic (implantable) cardiac defibrillator: Secondary | ICD-10-CM | POA: Insufficient documentation

## 2014-07-26 DIAGNOSIS — Z9861 Coronary angioplasty status: Secondary | ICD-10-CM | POA: Insufficient documentation

## 2014-07-26 DIAGNOSIS — I2589 Other forms of chronic ischemic heart disease: Secondary | ICD-10-CM | POA: Insufficient documentation

## 2014-07-26 DIAGNOSIS — F329 Major depressive disorder, single episode, unspecified: Secondary | ICD-10-CM | POA: Insufficient documentation

## 2014-07-26 DIAGNOSIS — F17200 Nicotine dependence, unspecified, uncomplicated: Secondary | ICD-10-CM | POA: Insufficient documentation

## 2014-07-26 DIAGNOSIS — I498 Other specified cardiac arrhythmias: Secondary | ICD-10-CM | POA: Insufficient documentation

## 2014-07-26 DIAGNOSIS — I2584 Coronary atherosclerosis due to calcified coronary lesion: Secondary | ICD-10-CM | POA: Insufficient documentation

## 2014-07-26 DIAGNOSIS — R0989 Other specified symptoms and signs involving the circulatory and respiratory systems: Secondary | ICD-10-CM | POA: Insufficient documentation

## 2014-07-26 DIAGNOSIS — I2789 Other specified pulmonary heart diseases: Secondary | ICD-10-CM | POA: Insufficient documentation

## 2014-07-26 DIAGNOSIS — I5022 Chronic systolic (congestive) heart failure: Secondary | ICD-10-CM | POA: Insufficient documentation

## 2014-07-26 DIAGNOSIS — I252 Old myocardial infarction: Secondary | ICD-10-CM | POA: Insufficient documentation

## 2014-07-26 HISTORY — PX: LEFT HEART CATHETERIZATION WITH CORONARY ANGIOGRAM: SHX5451

## 2014-07-26 SURGERY — LEFT HEART CATHETERIZATION WITH CORONARY ANGIOGRAM
Anesthesia: LOCAL

## 2014-07-26 MED ORDER — HEPARIN SODIUM (PORCINE) 1000 UNIT/ML IJ SOLN
INTRAMUSCULAR | Status: AC
  Filled 2014-07-26: qty 1

## 2014-07-26 MED ORDER — FENTANYL CITRATE 0.05 MG/ML IJ SOLN
INTRAMUSCULAR | Status: AC
  Filled 2014-07-26: qty 2

## 2014-07-26 MED ORDER — LIDOCAINE HCL (PF) 1 % IJ SOLN
INTRAMUSCULAR | Status: AC
  Filled 2014-07-26: qty 30

## 2014-07-26 MED ORDER — SODIUM CHLORIDE 0.9 % IV SOLN: 250.0000 mL | INTRAVENOUS | Status: DC | PRN

## 2014-07-26 MED ORDER — SODIUM CHLORIDE 0.9 % IJ SOLN: 3.0000 mL | Freq: Two times a day (BID) | INTRAMUSCULAR | Status: DC

## 2014-07-26 MED ORDER — HEPARIN (PORCINE) IN NACL 2-0.9 UNIT/ML-% IJ SOLN
INTRAMUSCULAR | Status: AC
  Filled 2014-07-26: qty 1000

## 2014-07-26 MED ORDER — SODIUM CHLORIDE 0.9 % IV SOLN: INTRAVENOUS | Status: DC

## 2014-07-26 MED ORDER — MIDAZOLAM HCL 2 MG/2ML IJ SOLN
INTRAMUSCULAR | Status: AC
  Filled 2014-07-26: qty 2

## 2014-07-26 MED ORDER — SODIUM CHLORIDE 0.9 % IJ SOLN: 3.0000 mL | INTRAMUSCULAR | Status: DC | PRN

## 2014-07-26 MED ORDER — VERAPAMIL HCL 2.5 MG/ML IV SOLN
INTRAVENOUS | Status: AC
  Filled 2014-07-26: qty 2

## 2014-07-26 MED ORDER — NITROGLYCERIN 1 MG/10 ML FOR IR/CATH LAB
INTRA_ARTERIAL | Status: AC
  Filled 2014-07-26: qty 10

## 2014-07-26 MED ORDER — RAMIPRIL 10 MG PO CAPS: 10.0000 mg | ORAL_CAPSULE | Freq: Every day | ORAL | Status: DC

## 2014-07-26 MED ORDER — ASPIRIN 81 MG PO CHEW: 81.0000 mg | CHEWABLE_TABLET | ORAL | Status: DC

## 2014-07-26 MED ORDER — SODIUM CHLORIDE 0.9 % IV SOLN
INTRAVENOUS | Status: DC
  Administered 2014-07-26: 10:00:00 via INTRAVENOUS

## 2014-07-26 NOTE — Interval H&P Note (Signed)
History and Physical Interval Note:  07/26/2014 11:41 AM  [name]  has presented today for surgery, with the diagnosis of heart failure  The various methods of treatment have been discussed with the patient and family. After consideration of risks, benefits and other options for treatment, the patient has consented to  Procedure(s): LEFT HEART CATHETERIZATION WITH CORONARY ANGIOGRAM (N/A) and possible angioplasty as a surgical intervention .  The patient's history has been reviewed, patient examined, no change in status, stable for surgery.  I have reviewed the patient's chart and labs.  Questions were answered to the patient's satisfaction.     [name]

## 2014-07-26 NOTE — Progress Notes (Signed)
TR BAND REMOVAL  LOCATION:    right radial  DEFLATED PER PROTOCOL:    Yes.    TIME BAND OFF / DRESSING APPLIED:    1550   SITE UPON ARRIVAL:    Level 0  SITE AFTER BAND REMOVAL:    Level 0  CIRCULATION SENSATION AND MOVEMENT:    Within Normal Limits   Yes.    COMMENTS:   Tegaderm dsg applied. Site unremarkable.

## 2014-07-26 NOTE — Addendum Note (Signed)
Encounter addended by: Bonne Dolores, NT on: 07/26/2014  8:17 AM<BR>     Documentation filed: Charges VN

## 2014-07-26 NOTE — CV Procedure (Signed)
Cardiac Cath Procedure Note:  Indication: Chest pain; abnormal ECG  Procedures performed:  1) Selective coronary angiography 2) Left heart catheterization 3) Left ventriculogram  Description of procedure:   The risks and indication of the procedure were explained. Consent was signed and placed on the chart. An appropriate timeout was taken prior to the procedure. After a normal Allen's test was confirmed, the right wrist was prepped and draped in the routine sterile fashion and anesthetized with 1% local lidocaine.   A 5 FR arterial sheath was then placed in the right radial artery using a modified Seldinger technique. Systemic heparin was administered. 3mg  IV verapamil was given through the sheath. Standard catheters including a JL 3.5, JR4 and straight pigtail were used. All catheter exchanges were made over a wire.  Complications:  None apparent  Findings:  Ao Pressure: 119/42 (72) LV Pressure: 125/5/11 There was no signficant gradient across the aortic valve on pullback.  Left main: Calcified 40% mid to distal stenosis  LAD: Long vessel wrapping the apex 20-30% plaque in midsection. Distal vessel is small.   LCX:  Large OM-1 and large OM-2. 50% lesion in proximal OM-2   RCA: Dominant vessel. Calcified, eccentric 50-60% lesion in midsection .  LV-gram done in the RAO projection: Ejection fraction = 25% Severe global HK of mid to distal LV. No MR.  Assessment: 1. Non-obstructive CAD 2. Severe LV dysfunction with EF 25% 3. Well compensated filling pressures  Plan/Discussion:  Medical therapy. Stop digoxin (level 2.0).    Glori Bickers MD 12:15 PM

## 2014-07-26 NOTE — Interval H&P Note (Signed)
History and Physical Interval Note:  07/26/2014 11:42 AM  [name]  has presented today for surgery, with the diagnosis of heart failure  The various methods of treatment have been discussed with the patient and family. After consideration of risks, benefits and other options for treatment, the patient has consented to  Procedure(s): LEFT HEART CATHETERIZATION WITH CORONARY ANGIOGRAM (N/A) and possible angioplasty as a surgical intervention .  The patient's history has been reviewed, patient examined, no change in status, stable for surgery.  I have reviewed the patient's chart and labs.  Questions were answered to the patient's satisfaction.   Cath Lab Visit (complete for each Cath Lab visit)  Clinical Evaluation Leading to the Procedure:   ACS: No.  Non-ACS:    Anginal Classification: CCS III  Anti-ischemic medical therapy: Minimal Therapy (1 class of medications)  Non-Invasive Test Results: No non-invasive testing performed  Prior CABG: No previous CABG        Glori Bickers

## 2014-07-26 NOTE — Progress Notes (Signed)
Assumed care of pt from Renee Richards, RN.  Report received.  Assessment documented. 

## 2014-07-26 NOTE — Progress Notes (Signed)
At 1340 i tried to remove 3cc of air from patients TRB.  Instantly it bleed.  I reinserted 3cc of air.  Will continue to monitor closely

## 2014-07-26 NOTE — Discharge Instructions (Signed)
Radial Site Care °Refer to this sheet in the next few weeks. These instructions provide you with information on caring for yourself after your procedure. Your caregiver may also give you more specific instructions. Your treatment has been planned according to current medical practices, but problems sometimes occur. Call your caregiver if you have any problems or questions after your procedure. °HOME CARE INSTRUCTIONS °· You may shower the day after the procedure. Remove the bandage (dressing) and gently wash the site with plain soap and water. Gently pat the site dry. °· Do not apply powder or lotion to the site. °· Do not submerge the affected site in water for 3 to 5 days. °· Inspect the site at least twice daily. °· Do not flex or bend the affected arm for 24 hours. °· No lifting over 5 pounds (2.3 kg) for 5 days after your procedure. °· Do not drive home if you are discharged the same day of the procedure. Have someone else drive you. °· You may drive 24 hours after the procedure unless otherwise instructed by your caregiver. °· Do not operate machinery or power tools for 24 hours. °· A responsible adult should be with you for the first 24 hours after you arrive home. °What to expect: °· Any bruising will usually fade within 1 to 2 weeks. °· Blood that collects in the tissue (hematoma) may be painful to the touch. It should usually decrease in size and tenderness within 1 to 2 weeks. °SEEK IMMEDIATE MEDICAL CARE IF: °· You have unusual pain at the radial site. °· You have redness, warmth, swelling, or pain at the radial site. °· You have drainage (other than a small amount of blood on the dressing). °· You have chills. °· You have a fever or persistent symptoms for more than 72 hours. °· You have a fever and your symptoms suddenly get worse. °· Your arm becomes pale, cool, tingly, or numb. °· You have heavy bleeding from the site. Hold pressure on the site. °Document Released: 12/26/2010 Document Revised:  02/15/2012 Document Reviewed: 12/26/2010 °ExitCare® Patient Information ©2015 [employer]. This information is not intended to replace advice given to you by your health care provider. Make sure you discuss any questions you have with your health care provider. ° °

## 2014-07-26 NOTE — H&P (View-Only) (Signed)
Patient ID: [name], female   DOB: 1951-06-16, 63 y.o.   MRN: [id number]   [address], [address] [place], [place]  [address] Phone: [phone] Fax:  [phone]  Date:  07/25/2014   ID:  [name], DOB 1951/07/12, MRN [id number]  PCP:  Ailene Ards, CRNA  Cardiologist:  Dr. Jenkins Rouge  Electrophysiologist:  Dr. Thompson Grayer    History of Present Illness: [name] is a 63 y.o. female who is referred to the [facility] for evaluation for advanced therapies.   She has a hx of CAD, s/p prior Ant MI, s/p prior POBA to LAD, Dx, RCA, CHF due to ICM with high scar burden by MRI (12/09), s/p ICD, HTN, HL, depression, tobacco abuse.   ABI 7/14 R 0.93 L 0.95 LHC (10/12):  dLAD 40, pCFX 30, OM1 50, pRCA 30.   MRI (12/09):  High scar burden, Inf and Apical AK, EF 24%.  Echo 10/14 EF 15% global HK. RV mild HK  Mod MR. Severe TR. PA 20mmHG   Lexiscan Myoview (11/14): LV Ejection Fraction: 24%. Extensive scar in the inferior wall, apex and anterior wall. Minimal reversibility    CPX 11/14 FVC 2.08 (67%)  FEV1 1.58 (66%)  FEV1/FVC 76%   Resting HR: 84 Peak HR: 142 (91% age predicted max HR) BP rest: 114/60 BP peak: 148/70 (IPE)  Peak VO2: 15 (63% predicted peak VO2) VE/VCO2 slope: 32 OUES: 0.85 Peak RER: 1.33 Ventilatory Threshold: 12.1 (50.8% predicted peak VO2) VE/MVV: 60.1% O2pulse: 6 (75% predicted O2pulse)  Follow up: Earlier in the year was scheduled for heart cath for persistent CP, however had to cancel d/t she had no ride. Recently had sinus infection and start on abx on Friday by PCP. [place] and she cuts grass and does all yard work. Occasional mild exertional chest heaviness which is slightly more frequent. No resting angina. Also c/o bilateral LE claudication. Mildly progressive can walk a few hundred yards. No rest pain or ulceration. Feels weak and foggy headed. No syncop. Denies SOB, PND, orthopnea or edema. Still smoking about 6  cigarettes a day. Weight stable 118-120. Working with lipid clinic to tolerate statins and zetia.  Optivol interrogated personally. Fluid has now come down to baseline. Activity level 4 hours per day. No VT.   Labs (10/12):  K 3.2, creatinine 0.9, Hgb 13.5 Labs (9/14):    K 3.4, creatinine 1.0, pro BNP 1560, Hgb 11.8, HDL 25, LDL 60, ALT 29 Labs (10/14):  K 3.3=> 4.2, creatinine 1.2, BNP 1175 Labs (10/02/13) K 3.2 creatinine 0.9 Labs (12/13/13): LDL 134, AST 24, ALT 24, TC 221, TG104,   Wt Readings from Last 3 Encounters:  07/25/14 120 lb 12 oz (54.772 kg)  07/12/14 118 lb (53.524 kg)  01/04/14 118 lb 6.4 oz (53.706 kg)     Past Medical History  Diagnosis Date  . Ischemic cardiomyopathy     MRI (12/09):  High scar burden, Inf and Apical AK, EF 24%.;  Echo (10/14): EF 15%, Gr 2 DD, mild to mod MR, mod LAE, RVSF mildly reduced, mod RAE, severe TR, PASP 49-53 (mod pulmonary HTN)  . Chronic systolic heart failure   . HTN (hypertension)   . Depression   . MI (myocardial infarction)   . ICD (implantable cardiac defibrillator) in place   . Asthma   . H/O hiatal hernia   . Arthritis     hands  . CAD (coronary artery disease)  a. s/p prior Ant MI, b. s/p prior POBA to LAD, Dx, RCA,;  c.  LHC (10/12):  dLAD 40, pCFX 30, OM1 50, pRCA 30;  d.  Carlton Adam Myoview (11/14):  High risk study; inferior, anterior, apical defects; minimal reversibility toward the apex; consistent with prior infarct and very mild peri-infarct ischemia, EF 24%, inferior/apical/anterior akinesis  . HLD (hyperlipidemia)   . Tobacco abuse     Current Outpatient Prescriptions  Medication Sig Dispense Refill  . amoxicillin-clavulanate (AUGMENTIN) 875-125 MG per tablet Take 1 tablet by mouth 2 (two) times daily.      Marland Kitchen aspirin 81 MG tablet Take 81 mg by mouth daily.        . carvedilol (COREG) 6.25 MG tablet Take 1 tablet (6.25 mg total) by mouth 2 (two) times daily.  60 tablet  11  . dexlansoprazole (DEXILANT) 60 MG  capsule Take 60 mg by mouth daily.      . digoxin (LANOXIN) 0.125 MG tablet TAKE ONE TABLET BY MOUTH DAILY  30 tablet  4  . ezetimibe (ZETIA) 10 MG tablet Take 5 mg by mouth every other day.      . fexofenadine (ALLEGRA) 180 MG tablet Take 180 mg by mouth daily as needed for allergies.       . fish oil-omega-3 fatty acids 1000 MG capsule Take 2 g by mouth daily.       . fluticasone (FLONASE) 50 MCG/ACT nasal spray Place 2 sprays into the nose daily.      . Fluticasone-Salmeterol (ADVAIR) 100-50 MCG/DOSE AEPB Inhale 1 puff into the lungs every 12 (twelve) hours.      . furosemide (LASIX) 40 MG tablet TAKE 1 AND 1/2 TABLETS IN THE MORNING AND 1 TABLET IN THE EVENING  75 tablet  3  . PARoxetine (PAXIL) 30 MG tablet Take 30 mg by mouth daily.       . Psyllium (METAMUCIL) 30.9 % POWD Mix 1 tablespoon in 8 ounces of water and drink - twice daily      . ramipril (ALTACE) 10 MG capsule Take 1 capsule (10 mg total) by mouth daily.  15 capsule  0  . rosuvastatin (CRESTOR) 5 MG tablet Take 5 mg by mouth 4 (four) times a week.      . spironolactone (ALDACTONE) 25 MG tablet TAKE 1 TABLET BY MOUTH EVERY DAY  30 tablet  0  . vitamin B-12 (CYANOCOBALAMIN) 250 MCG tablet Take 250 mcg by mouth daily.      . [DISCONTINUED] Budesonide (PULMICORT IN) Inhale into the lungs as directed.        . [DISCONTINUED] omeprazole (PRILOSEC) 10 MG capsule Take 10 mg by mouth daily.         No current facility-administered medications for this encounter.    Allergies:    Allergies  Allergen Reactions  . Statins Other (See Comments)    Gradually tired with daily Lipitor and Crestor 5 mg more than 4 times per week    Social History:  The patient  reports that she has been smoking Cigarettes.  She has been smoking about 0.00 packs per day. She has never used smokeless tobacco. She reports that she does not drink alcohol or use illicit drugs.   ROS:  Please see the history of present illness.   All other systems reviewed and  negative.   Filed Vitals:   07/25/14 1055  BP: 108/54  Pulse: 47  Weight: 120 lb 12 oz (54.772 kg)  SpO2: 98%  PHYSICAL EXAM:  Well nourished, well developed, in no acute distress HEENT: normal Neck: no JVD Cardiac:  normal S1, S2; brady regular; 1/6 systolic murmur LLSB Lungs:  Markedly decreased breath sounds bilaterally, long exp phase no rales, no wheezing  Abd: soft, nontender, no hepatomegaly Ext: no edema, cyanosis or clubbing. L DP non palpable. R DP 1+  Skin: warm and dry Neuro:  CNs 2-12 intact, no focal abnormalities noted  ECG: Marked sinus brady 42. Anterolateral TWI - much more pronounced since previous  ASSESSMENT AND PLAN:  1. CAD with progressive exertional chest pain and abnormal ECG: 2.  Chronic Systolic CHF:  EF 0000000 (99991111) due to ICM with mild RV dysfunction s/p Medtronic ICD 3. COPD with ongoing tobacco abuse 4. Intermittent claudication and diminished peripheral pulses with normal ABIs 7/14 5. Bilateral carotid bruits 6. Symptomatic bradycardia 7. HTN 8. Hyperlipidemia   Discussion:   She is having progressive exertional chest pressure with anterior TWI which is worse from previous. She is not having any resting angina. She also has marked sinus bradycardia with HRs in low 40s. I have suggested admission for cardiac cath but she refuses. Will check labs today including troponin, BMET and digoxin level. Have arranged for outpatient cath tomorrow. She will call 911 if has recurrent CP. Hold digoxin and carvedilol. Will u/s carotids and refer to Dr. Fletcher Anon for further evaluation of claudication. Discussed need for smoking cessation.   Total time spent 45 minutes. Over half that time spent discussing above.    Glori Bickers MD 11:08 AM  Labs reviewed. Trop ok. K 5.1. Digoxin 2.0 (dig has been stopped)  Benay Spice 3:34 PM

## 2014-07-30 ENCOUNTER — Ambulatory Visit (HOSPITAL_COMMUNITY): Payer: Medicare Other | Attending: Internal Medicine | Admitting: Radiology

## 2014-07-30 DIAGNOSIS — R0989 Other specified symptoms and signs involving the circulatory and respiratory systems: Secondary | ICD-10-CM

## 2014-07-30 DIAGNOSIS — I658 Occlusion and stenosis of other precerebral arteries: Secondary | ICD-10-CM | POA: Diagnosis present

## 2014-07-30 DIAGNOSIS — I6529 Occlusion and stenosis of unspecified carotid artery: Secondary | ICD-10-CM

## 2014-07-30 NOTE — Progress Notes (Signed)
Carotid Duplex performed. 

## 2014-08-07 ENCOUNTER — Encounter: Payer: Self-pay | Admitting: Cardiovascular Disease

## 2014-08-07 ENCOUNTER — Ambulatory Visit (INDEPENDENT_AMBULATORY_CARE_PROVIDER_SITE_OTHER): Payer: Medicare Other | Admitting: Cardiovascular Disease

## 2014-08-07 VITALS — BP 116/64 | HR 86 | Ht 63.5 in | Wt 120.1 lb

## 2014-08-07 DIAGNOSIS — I2510 Atherosclerotic heart disease of native coronary artery without angina pectoris: Secondary | ICD-10-CM

## 2014-08-07 DIAGNOSIS — I739 Peripheral vascular disease, unspecified: Secondary | ICD-10-CM

## 2014-08-07 DIAGNOSIS — I6523 Occlusion and stenosis of bilateral carotid arteries: Secondary | ICD-10-CM

## 2014-08-07 DIAGNOSIS — I6529 Occlusion and stenosis of unspecified carotid artery: Secondary | ICD-10-CM

## 2014-08-07 DIAGNOSIS — I658 Occlusion and stenosis of other precerebral arteries: Secondary | ICD-10-CM

## 2014-08-07 NOTE — Patient Instructions (Signed)
Your physician has requested that you have a lower extremity arterial doppler. This test is an ultrasound of the arteries in the legs. It looks at arterial blood flow in the legs. Allow one hour for Lower Arterial scans. There are no restrictions or special instructions  Your physician recommends that you schedule a follow-up appointment as needed with Dr Fletcher Anon.

## 2014-08-07 NOTE — Assessment & Plan Note (Addendum)
The patient has mild bilateral calf claudication (Rutherford class 1) which is currently not lifestyle limiting. I requested a LE arterial doppler to ensure stability given slight worsening in symptoms. No indication for revascularization.  She is on optimal medical therapy otherwise.   I discussed natural history and importance os smoking cessation.

## 2014-08-07 NOTE — Assessment & Plan Note (Signed)
Asymptomatic. Recommend a follow up doppler in 6 months. Continue Aspirin.

## 2014-08-07 NOTE — Progress Notes (Signed)
HPI  This is a 63 year old female who was referred by Dr. Haroldine Laws for evaluation of claudication. She has a hx of CAD, s/p prior Ant MI, s/p prior POBA to LAD, Dx, RCA, CHF due to ICM with high scar burden by MRI (12/09), s/p ICD, HTN, HL, depression, tobacco abuse.  ABI 7/14 R 0.93 L 0.95 with triphasic waveforms throughout. LHC (08/15): Mild nonobstructive disease with ejection fraction of 25%  Echo 10/14 EF 15% global HK. RV mild HK Mod MR. Severe TR. PA 97mmHG  Carotid Doppler : August 2015 showed 60-79% bilateral ICA stenosis. No previous CVA.  She complains of mild bilateral calf discomfort mainly if she walks uphill. Rarely on flat level. This does not interfere with activities of daily living.    Allergies  Allergen Reactions  . Statins Other (See Comments)    Gradually tired with daily Lipitor and Crestor 5 mg more than 4 times per week     Current Outpatient Prescriptions on File Prior to Visit  Medication Sig Dispense Refill  . aspirin 81 MG tablet Take 81 mg by mouth daily.        Marland Kitchen dexlansoprazole (DEXILANT) 60 MG capsule Take 60 mg by mouth daily.      Marland Kitchen ezetimibe (ZETIA) 10 MG tablet Take 5 mg by mouth every other day.      . fexofenadine (ALLEGRA) 180 MG tablet Take 180 mg by mouth daily as needed for allergies.       . fish oil-omega-3 fatty acids 1000 MG capsule Take 2 g by mouth daily.       . Fluticasone-Salmeterol (ADVAIR) 100-50 MCG/DOSE AEPB Inhale 1 puff into the lungs daily.       . furosemide (LASIX) 40 MG tablet Take 40-60 mg by mouth 2 (two) times daily. Take 60 mg by mouth in the morning and take 40 mg by mouth in the afternoon      . PARoxetine (PAXIL) 30 MG tablet Take 30 mg by mouth daily.       . polyethylene glycol (MIRALAX / GLYCOLAX) packet Take 17 g by mouth at bedtime.      . ramipril (ALTACE) 10 MG capsule Take 1 capsule (10 mg total) by mouth daily.  30 capsule  0  . rosuvastatin (CRESTOR) 5 MG tablet Take 5 mg by mouth 4 (four) times a week.       . spironolactone (ALDACTONE) 25 MG tablet Take 25 mg by mouth daily.      Marland Kitchen triamcinolone (NASACORT ALLERGY 24HR) 55 MCG/ACT AERO nasal inhaler Place 2 sprays into both nostrils daily.      . vitamin B-12 (CYANOCOBALAMIN) 250 MCG tablet Take 250 mcg by mouth daily.      . [DISCONTINUED] Budesonide (PULMICORT IN) Inhale into the lungs as directed.        . [DISCONTINUED] omeprazole (PRILOSEC) 10 MG capsule Take 10 mg by mouth daily.         No current facility-administered medications on file prior to visit.     Past Medical History  Diagnosis Date  . Ischemic cardiomyopathy     MRI (12/09):  High scar burden, Inf and Apical AK, EF 24%.;  Echo (10/14): EF 15%, Gr 2 DD, mild to mod MR, mod LAE, RVSF mildly reduced, mod RAE, severe TR, PASP 49-53 (mod pulmonary HTN)  . Chronic systolic heart failure   . HTN (hypertension)   . Depression   . MI (myocardial infarction)   . ICD (implantable cardiac defibrillator)  in place   . Asthma   . H/O hiatal hernia   . Arthritis     hands  . CAD (coronary artery disease)     a. s/p prior Ant MI, b. s/p prior POBA to LAD, Dx, RCA,;  c.  LHC (10/12):  dLAD 40, pCFX 30, OM1 50, pRCA 30;  d.  Carl[facility] Adam Myoview (11/14):  High risk study; inferior, anterior, apical defects; minimal reversibility toward the apex; consistent with prior infarct and very mild peri-infarct ischemia, EF 24%, inferior/apical/anterior akinesis  . HLD (hyperlipidemia)   . Tobacco abuse      Past Surgical History  Procedure Laterality Date  . Cardiac defibrillator placement  01/18/09    MDT implanted by JA  . Insert / replace / remove pacemaker      "pacemaker is not on"  . Coronary angioplasty    . Cardiac stents    . Colonoscopy  03/18/2012    Procedure: COLONOSCOPY;  Surgeon: Beryle Beams, MD;  Location: [facility];  Service: Endoscopy;  Laterality: N/A;  . Esophagogastroduodenoscopy N/A 03/10/2013    Procedure: ESOPHAGOGASTRODUODENOSCOPY (EGD);  Surgeon: Beryle Beams, MD;  Location: [facility];  Service: Endoscopy;  Laterality: N/A;     No family history on file.   History   Social History  . Marital Status: Divorced    Spouse Name: N/A    Number of Children: 2  . Years of Education: N/A   Occupational History  . Works for [employer] History Main Topics  . Smoking status: Current Every Day Smoker    Types: Cigarettes  . Smokeless tobacco: Never Used  . Alcohol Use: No  . Drug Use: No  . Sexual Activity: No   Other Topics Concern  . Not on file   Social History Narrative   Lives in [place] 10 point review of system was performed. It is negative other than that mentioned in the history of present illness.   PHYSICAL EXAM   BP 116/64  Pulse 86  Ht 5' 3.5" (1.613 m)  Wt 120 lb 1.9 oz (54.486 kg)  BMI 20.94 kg/m2 Constitutional: He is oriented to person, place, and time. He appears well-developed and well-nourished. No distress.  HENT: No nasal discharge.  Head: Normocephalic and atraumatic.  Eyes: Pupils are equal and round.  No discharge. Neck: Normal range of motion. Neck supple. No JVD present. No thyromegaly present. Faint carotid bruits.  Cardiovascular: Normal rate, regular rhythm, normal heart sounds. Exam reveals no gallop and no friction rub. No murmur heard.  Pulmonary/Chest: Effort normal and breath sounds normal. No stridor. No respiratory distress. He has no wheezes. He has no rales. He exhibits no tenderness.  Abdominal: Soft. Bowel sounds are normal. He exhibits no distension. There is no tenderness. There is no rebound and no guarding.  Musculoskeletal: Normal range of motion. He exhibits no edema and no tenderness.  Neurological: He is alert and oriented to person, place, and time. Coordination normal.  Skin: Skin is warm and dry. No rash noted. He is not diaphoretic. No erythema. No pallor.  Psychiatric: He has a normal mood and affect. His behavior is normal. Judgment and  thought content normal.  Vascular: Femoral pulse: normal with faint bruits. Distal pulses are not palpable.      ASSESSMENT AND PLAN

## 2014-08-10 ENCOUNTER — Other Ambulatory Visit (HOSPITAL_COMMUNITY): Payer: Self-pay | Admitting: Cardiology

## 2014-08-10 ENCOUNTER — Ambulatory Visit (HOSPITAL_COMMUNITY): Payer: Medicare Other | Attending: Cardiovascular Disease | Admitting: Cardiology

## 2014-08-10 DIAGNOSIS — E785 Hyperlipidemia, unspecified: Secondary | ICD-10-CM | POA: Diagnosis not present

## 2014-08-10 DIAGNOSIS — I739 Peripheral vascular disease, unspecified: Secondary | ICD-10-CM

## 2014-08-10 DIAGNOSIS — I70219 Atherosclerosis of native arteries of extremities with intermittent claudication, unspecified extremity: Secondary | ICD-10-CM | POA: Diagnosis present

## 2014-08-10 DIAGNOSIS — I10 Essential (primary) hypertension: Secondary | ICD-10-CM | POA: Insufficient documentation

## 2014-08-10 DIAGNOSIS — F17200 Nicotine dependence, unspecified, uncomplicated: Secondary | ICD-10-CM | POA: Insufficient documentation

## 2014-08-10 DIAGNOSIS — I2510 Atherosclerotic heart disease of native coronary artery without angina pectoris: Secondary | ICD-10-CM | POA: Diagnosis not present

## 2014-08-10 NOTE — Progress Notes (Signed)
LEA Doppler/ABI and duplex performed.

## 2014-08-23 ENCOUNTER — Other Ambulatory Visit: Payer: Self-pay | Admitting: Physician Assistant

## 2014-08-28 ENCOUNTER — Encounter: Payer: Self-pay | Admitting: Cardiovascular Disease

## 2014-08-28 ENCOUNTER — Ambulatory Visit (INDEPENDENT_AMBULATORY_CARE_PROVIDER_SITE_OTHER): Payer: Medicare Other | Admitting: Cardiovascular Disease

## 2014-08-28 VITALS — BP 104/62 | HR 90 | Ht 63.5 in | Wt 122.4 lb

## 2014-08-28 DIAGNOSIS — I6523 Occlusion and stenosis of bilateral carotid arteries: Secondary | ICD-10-CM

## 2014-08-28 DIAGNOSIS — I2510 Atherosclerotic heart disease of native coronary artery without angina pectoris: Secondary | ICD-10-CM

## 2014-08-28 DIAGNOSIS — I739 Peripheral vascular disease, unspecified: Secondary | ICD-10-CM

## 2014-08-28 DIAGNOSIS — I6529 Occlusion and stenosis of unspecified carotid artery: Secondary | ICD-10-CM

## 2014-08-28 DIAGNOSIS — I658 Occlusion and stenosis of other precerebral arteries: Secondary | ICD-10-CM

## 2014-08-28 NOTE — Assessment & Plan Note (Signed)
Asymptomatic. Recommend a follow up doppler in 6 months. Continue Aspirin.

## 2014-08-28 NOTE — Progress Notes (Signed)
HPI  This is a 63 year old female who was is here today for a follow up visit regarding PAD. She has a hx of CAD, s/p prior Ant MI, s/p prior POBA to LAD, Dx, RCA, CHF due to ICM with high scar burden by MRI (12/09), s/p ICD, HTN, HL, depression, tobacco abuse.  ABI 7/14 R 0.93 L 0.95 with triphasic waveforms throughout. LHC (08/15): Mild nonobstructive disease with ejection fraction of 25%  Echo 10/14 EF 15% global HK. RV mild HK Mod MR. Severe TR. PA 60mmHG  Carotid Doppler : August 2015 showed 60-79% bilateral ICA stenosis. No previous CVA.  She was seen recently for bilateral calf claudication worse on the left side which was felt to be not lifestyle limiting. I repeated her noninvasive evaluation which showed a drop in ABI on the left side to 0.56 and 0.78 on the right side. There was evidence of bilateral SFA disease. This was certainly worse than expected based on her reported symptoms. However, she does report having to rest for 5-10 minutes if she walks 200 yards. She does have cramps at night.  Allergies  Allergen Reactions  . Statins Other (See Comments)    Gradually tired with daily Lipitor and Crestor 5 mg more than 4 times per week     Current Outpatient Prescriptions on File Prior to Visit  Medication Sig Dispense Refill  . aspirin 81 MG tablet Take 81 mg by mouth daily.        Marland Kitchen dexlansoprazole (DEXILANT) 60 MG capsule Take 60 mg by mouth daily.      Marland Kitchen ezetimibe (ZETIA) 10 MG tablet Take 5 mg by mouth every other day.      . fexofenadine (ALLEGRA) 180 MG tablet Take 180 mg by mouth daily as needed for allergies.       . fish oil-omega-3 fatty acids 1000 MG capsule Take 2 g by mouth daily.       . Fluticasone-Salmeterol (ADVAIR) 100-50 MCG/DOSE AEPB Inhale 1 puff into the lungs daily.       . furosemide (LASIX) 40 MG tablet Take 40-60 mg by mouth 2 (two) times daily. Take 60 mg by mouth in the morning and take 40 mg by mouth in the afternoon      . PARoxetine (PAXIL) 30 MG  tablet Take 30 mg by mouth daily.       . polyethylene glycol (MIRALAX / GLYCOLAX) packet Take 17 g by mouth at bedtime.      . ramipril (ALTACE) 10 MG capsule Take 1 capsule (10 mg total) by mouth daily.  30 capsule  0  . rosuvastatin (CRESTOR) 5 MG tablet Take 5 mg by mouth 4 (four) times a week.      . spironolactone (ALDACTONE) 25 MG tablet TAKE 1 TABLET BY MOUTH EVERY DAY  30 tablet  4  . triamcinolone (NASACORT ALLERGY 24HR) 55 MCG/ACT AERO nasal inhaler Place 2 sprays into both nostrils daily.      . vitamin B-12 (CYANOCOBALAMIN) 250 MCG tablet Take 250 mcg by mouth daily.      . [DISCONTINUED] Budesonide (PULMICORT IN) Inhale into the lungs as directed.        . [DISCONTINUED] omeprazole (PRILOSEC) 10 MG capsule Take 10 mg by mouth daily.         No current facility-administered medications on file prior to visit.     Past Medical History  Diagnosis Date  . Ischemic cardiomyopathy     MRI (12/09):  High scar burden,  Inf and Apical AK, EF 24%.;  Echo (10/14): EF 15%, Gr 2 DD, mild to mod MR, mod LAE, RVSF mildly reduced, mod RAE, severe TR, PASP 49-53 (mod pulmonary HTN)  . Chronic systolic heart failure   . HTN (hypertension)   . Depression   . MI (myocardial infarction)   . ICD (implantable cardiac defibrillator) in place   . Asthma   . H/O hiatal hernia   . Arthritis     hands  . CAD (coronary artery disease)     a. s/p prior Ant MI, b. s/p prior POBA to LAD, Dx, RCA,;  c.  LHC (10/12):  dLAD 40, pCFX 30, OM1 50, pRCA 30;  d.  Carl[facility] Adam Myoview (11/14):  High risk study; inferior, anterior, apical defects; minimal reversibility toward the apex; consistent with prior infarct and very mild peri-infarct ischemia, EF 24%, inferior/apical/anterior akinesis  . HLD (hyperlipidemia)   . Tobacco abuse      Past Surgical History  Procedure Laterality Date  . Cardiac defibrillator placement  01/18/09    MDT implanted by JA  . Insert / replace / remove pacemaker      "pacemaker is  not on"  . Coronary angioplasty    . Cardiac stents    . Colonoscopy  03/18/2012    Procedure: COLONOSCOPY;  Surgeon: Beryle Beams, MD;  Location: [facility];  Service: Endoscopy;  Laterality: N/A;  . Esophagogastroduodenoscopy N/A 03/10/2013    Procedure: ESOPHAGOGASTRODUODENOSCOPY (EGD);  Surgeon: Beryle Beams, MD;  Location: [facility];  Service: Endoscopy;  Laterality: N/A;     No family history on file.   History   Social History  . Marital Status: Divorced    Spouse Name: N/A    Number of Children: 2  . Years of Education: N/A   Occupational History  . Works for [employer] History Main Topics  . Smoking status: Current Every Day Smoker    Types: Cigarettes  . Smokeless tobacco: Never Used  . Alcohol Use: No  . Drug Use: No  . Sexual Activity: No   Other Topics Concern  . Not on file   Social History Narrative   Lives in [place] 10 point review of system was performed. It is negative other than that mentioned in the history of present illness.   PHYSICAL EXAM   BP 104/62  Pulse 90  Ht 5' 3.5" (1.613 m)  Wt 122 lb 6.4 oz (55.52 kg)  BMI 21.34 kg/m2 Constitutional: He is oriented to person, place, and time. He appears well-developed and well-nourished. No distress.  HENT: No nasal discharge.  Head: Normocephalic and atraumatic.  Eyes: Pupils are equal and round.  No discharge. Neck: Normal range of motion. Neck supple. No JVD present. No thyromegaly present. Faint carotid bruits.  Cardiovascular: Normal rate, regular rhythm, normal heart sounds. Exam reveals no gallop and no friction rub. No murmur heard.  Pulmonary/Chest: Effort normal and breath sounds normal. No stridor. No respiratory distress. He has no wheezes. He has no rales. He exhibits no tenderness.  Abdominal: Soft. Bowel sounds are normal. He exhibits no distension. There is no tenderness. There is no rebound and no guarding.  Musculoskeletal: Normal range of  motion. He exhibits no edema and no tenderness.  Neurological: He is alert and oriented to person, place, and time. Coordination normal.  Skin: Skin is warm and dry. No rash noted. He is not diaphoretic. No erythema. No pallor.  Psychiatric: He has a normal mood and affect. His behavior is normal. Judgment and thought content normal.  Vascular: Femoral pulse: normal with faint bruits. Distal pulses are not palpable.      ASSESSMENT AND PLAN

## 2014-08-28 NOTE — Assessment & Plan Note (Signed)
The patient has moderate bilateral calf claudication worse on the left side due to SFA disease. Cilostazol is contraindicated due to congestive heart failure. I discussed with her the options of an attempted walking program versus endovascular intervention. She prefers to keep procedures as a last resort and she does not be significantly limited by her symptoms. I instructed her to start a walking program. Reevaluate symptoms in 6 months. She is otherwise on good medical therapy. Smoking cessation is strongly advised.

## 2014-08-28 NOTE — Patient Instructions (Signed)
Your physician wants you to follow-up in:   [name] will receive a reminder letter in the mail two months in advance. If you don't receive a letter, please call our office to schedule the follow-up appointment. Your physician recommends that you continue on your current medications as directed. Please refer to the Current Medication list given to you today.

## 2014-08-29 ENCOUNTER — Inpatient Hospital Stay (HOSPITAL_COMMUNITY): Admission: RE | Admit: 2014-08-29 | Payer: Medicare Other | Source: Ambulatory Visit

## 2014-10-02 ENCOUNTER — Encounter (HOSPITAL_COMMUNITY): Payer: Medicare Other

## 2014-10-09 ENCOUNTER — Other Ambulatory Visit (INDEPENDENT_AMBULATORY_CARE_PROVIDER_SITE_OTHER): Payer: Medicare Other

## 2014-10-09 DIAGNOSIS — Z79899 Other long term (current) drug therapy: Secondary | ICD-10-CM

## 2014-10-09 DIAGNOSIS — E785 Hyperlipidemia, unspecified: Secondary | ICD-10-CM

## 2014-10-09 LAB — HEPATIC FUNCTION PANEL
ALK PHOS: 84 U/L (ref 39–117)
ALT: 15 U/L (ref 0–35)
AST: 15 U/L (ref 0–37)
Albumin: 3.5 g/dL (ref 3.5–5.2)
BILIRUBIN TOTAL: 0.6 mg/dL (ref 0.2–1.2)
Bilirubin, Direct: 0 mg/dL (ref 0.0–0.3)
TOTAL PROTEIN: 7.6 g/dL (ref 6.0–8.3)

## 2014-10-09 LAB — LIPID PANEL
CHOL/HDL RATIO: 3
Cholesterol: 171 mg/dL (ref 0–200)
HDL: 67.2 mg/dL (ref >39.00)
LDL Cholesterol: 79 mg/dL (ref 0–99)
NonHDL: 103.8
Triglycerides: 126 mg/dL (ref 0.0–149.0)
VLDL: 25.2 mg/dL (ref 0.0–40.0)

## 2014-10-11 ENCOUNTER — Encounter: Payer: Self-pay | Admitting: Pharmacist Clinician (PhC)/ Clinical Pharmacy Specialist

## 2014-10-11 ENCOUNTER — Ambulatory Visit (INDEPENDENT_AMBULATORY_CARE_PROVIDER_SITE_OTHER): Payer: Medicare Other | Admitting: Pharmacist Clinician (PhC)/ Clinical Pharmacy Specialist

## 2014-10-11 DIAGNOSIS — E785 Hyperlipidemia, unspecified: Secondary | ICD-10-CM

## 2014-10-11 DIAGNOSIS — I2510 Atherosclerotic heart disease of native coronary artery without angina pectoris: Secondary | ICD-10-CM

## 2014-10-11 NOTE — Progress Notes (Signed)
HPI  [name] is a 63 yo pt referred by Dr. Johnsie Cancel for statin intolerance.  She has a history of ischemic cardiomyopathy and stents to LAD and RCA in 2007.  Has history of elevated cholesterol.  Currently on Crestor 5 mg four times per week, as she couldn't handle Crestor 5 mg fives per week or higher.  Was first placed on Lipitor 20mg  and tolerated with no problems until dose was increased to 40mg  daily.  She then had problems with muscle weakness and lack of energy.  She was switched to Crestor 10 mg qd with same effects and most recently switched back to Lipitor with no success.  She tells me today that she tried Zetia in the past, and had to stop as this caused muscle aches similar to these statins as well.   LDL dropped from 184 mg/dL to 60 mg/dL from 06/2013 to 08/2013 on Crestor initially, then LDL went up to 136 mg/dL despite patient stating she has been compliant with Crestor 5 mg tiw, and now LDL up to 147 mg/dL on Crestor 5 mg qiw and compliant.   LDL still down from 184 mg/dL to 135-145 mg/dL on Crestor 5 mg qiw which is an expected reduction on this type of dosage. Patient has CAD > 5 years and is a smoker. She was interested in PCSK-9 inhibitor study, so we checked a CRP today, however the levels were undetectable, and HDL is > 40, so she doesn't qualify for study drug.  She does not have a family history of premature CAD.    She is currently taking Crestor 5 mg four times weekly, Zetia 5 mg every other day and fish oil 2 gm daily.  About 6 weeks ago she started drinking a freeze-dried juicing product, OJC.  For the first couple of week she drank this once daily, but reported some constipation, so has been using every other day.  Looking at the product labeling it has about 3 gm of fiber per serving.     She typically eats breakfast, then a later lunch (3 pm).  Rarely eats an evening meal.  She tells me she has a h/o constipation which she has to use Miralax periodically for.  She admits to  smoking 6 cigarettes daily, and is having a hard time smoking less than this.  She has failed nicotine replacement therapy.    Pt is currently not exercising.  She is limited by her heart failure. She tries to stay active with yard and house work.   Intolerant:  Daily Crestor 10 mg qd, Crestor 5 mg five times per week, daily lipitor 20-40 mg qd, Zetia 10 mg qd (muscle and joint aches)  Labs:   10/2014 - TC 171, LDL 79, TG 126, HDL 67.2, nonHDL 103.8 (Crestor 5 mg qiw, Zetia 5 mg qod, fish oil 2 g/d) 07/2014 - TC 219, LDL 147, TG 135, HDL 45 (Crestor 5 mg qiw, fish oil 2 g/d) 03/2014 - TC 206, LDL 136, TG 122, HDL 45 (Crestor 5 mg tiw, fish oil 2 g/d, metamucil once daily) 12/2013 - TC 221, LDL 134, TG 104, HDL 55  (Crestor 5 mg tiw, fish oil 2 g/d) 08/2013 - TC 100, LDL 60 (Crestor 5 mg tiw, fish oil 2 g/d) 06/2013 - TC 258, LDL 184 (fish oil only)  Current Outpatient Prescriptions on File Prior to Visit  Medication Sig Dispense Refill  . aspirin 81 MG tablet Take 81 mg by mouth daily.      Marland Kitchen  dexlansoprazole (DEXILANT) 60 MG capsule Take 60 mg by mouth daily.    Marland Kitchen ezetimibe (ZETIA) 10 MG tablet Take 5 mg by mouth every other day.    . fexofenadine (ALLEGRA) 180 MG tablet Take 180 mg by mouth daily as needed for allergies.     . fish oil-omega-3 fatty acids 1000 MG capsule Take 2 g by mouth daily.     . Fluticasone-Salmeterol (ADVAIR) 100-50 MCG/DOSE AEPB Inhale 1 puff into the lungs daily.     . furosemide (LASIX) 40 MG tablet Take 40-60 mg by mouth 2 (two) times daily. Take 60 mg by mouth in the morning and take 40 mg by mouth in the afternoon    . PARoxetine (PAXIL) 30 MG tablet Take 30 mg by mouth daily.     . polyethylene glycol (MIRALAX / GLYCOLAX) packet Take 17 g by mouth at bedtime.    . ramipril (ALTACE) 10 MG capsule Take 1 capsule (10 mg total) by mouth daily. 30 capsule 0  . rosuvastatin (CRESTOR) 5 MG tablet Take 5 mg by mouth 4 (four) times a week.    . spironolactone (ALDACTONE)  25 MG tablet TAKE 1 TABLET BY MOUTH EVERY DAY 30 tablet 4  . triamcinolone (NASACORT ALLERGY 24HR) 55 MCG/ACT AERO nasal inhaler Place 2 sprays into both nostrils daily.    . vitamin B-12 (CYANOCOBALAMIN) 250 MCG tablet Take 250 mcg by mouth daily.    . [DISCONTINUED] Budesonide (PULMICORT IN) Inhale into the lungs as directed.      . [DISCONTINUED] omeprazole (PRILOSEC) 10 MG capsule Take 10 mg by mouth daily.       No current facility-administered medications on file prior to visit.   Allergies  Allergen Reactions  . Statins Other (See Comments)    Gradually tired with daily Lipitor and Crestor 5 mg more than 4 times per week

## 2014-10-11 NOTE — Assessment & Plan Note (Signed)
Today [name]' cholesterol has improved greatly.  Her LDL dropped by about 46% in the past 3 months.  For now we have asked her to continue her current regimen.  Gave her Zetia samples in the office today, as cost of medications is a concern for her.  She receives her Crestor from an [employer] patient assistance program.  We will repeat lipid and liver labs in 3 months and see her a few days afterward.

## 2014-10-11 NOTE — Patient Instructions (Signed)
1.  Continue with Crestor 5 mg 4 times per week, Zetia 5 mg every other day and fish oil 2 gm daily  2.  Continue with high fiber diet.  3.  Repeat cholesterol labs on Jan 07, 2015 at 8:30am  4.  Return for office visit on Feb 9 at 10:30am

## 2014-10-19 ENCOUNTER — Encounter (HOSPITAL_COMMUNITY): Payer: Medicare Other

## 2014-10-24 ENCOUNTER — Ambulatory Visit (HOSPITAL_COMMUNITY)
Admission: RE | Admit: 2014-10-24 | Discharge: 2014-10-24 | Disposition: A | Discharge status: Home / Self Care | Payer: Medicare Other | Source: Ambulatory Visit | Attending: Internal Medicine | Admitting: Internal Medicine

## 2014-10-24 VITALS — BP 98/52 | HR 88 | Wt 125.2 lb

## 2014-10-24 DIAGNOSIS — J449 Chronic obstructive pulmonary disease, unspecified: Secondary | ICD-10-CM | POA: Diagnosis not present

## 2014-10-24 DIAGNOSIS — I252 Old myocardial infarction: Secondary | ICD-10-CM | POA: Insufficient documentation

## 2014-10-24 DIAGNOSIS — I739 Peripheral vascular disease, unspecified: Secondary | ICD-10-CM | POA: Diagnosis not present

## 2014-10-24 DIAGNOSIS — I6523 Occlusion and stenosis of bilateral carotid arteries: Secondary | ICD-10-CM | POA: Diagnosis not present

## 2014-10-24 DIAGNOSIS — Z720 Tobacco use: Secondary | ICD-10-CM

## 2014-10-24 DIAGNOSIS — E785 Hyperlipidemia, unspecified: Secondary | ICD-10-CM | POA: Diagnosis not present

## 2014-10-24 DIAGNOSIS — R001 Bradycardia, unspecified: Secondary | ICD-10-CM | POA: Insufficient documentation

## 2014-10-24 DIAGNOSIS — Z7982 Long term (current) use of aspirin: Secondary | ICD-10-CM | POA: Diagnosis not present

## 2014-10-24 DIAGNOSIS — I5023 Acute on chronic systolic (congestive) heart failure: Secondary | ICD-10-CM

## 2014-10-24 DIAGNOSIS — I5022 Chronic systolic (congestive) heart failure: Secondary | ICD-10-CM | POA: Insufficient documentation

## 2014-10-24 DIAGNOSIS — Z95810 Presence of automatic (implantable) cardiac defibrillator: Secondary | ICD-10-CM | POA: Diagnosis not present

## 2014-10-24 DIAGNOSIS — I2510 Atherosclerotic heart disease of native coronary artery without angina pectoris: Secondary | ICD-10-CM | POA: Insufficient documentation

## 2014-10-24 DIAGNOSIS — F17210 Nicotine dependence, cigarettes, uncomplicated: Secondary | ICD-10-CM | POA: Insufficient documentation

## 2014-10-24 DIAGNOSIS — I10 Essential (primary) hypertension: Secondary | ICD-10-CM | POA: Diagnosis not present

## 2014-10-24 DIAGNOSIS — Z79899 Other long term (current) drug therapy: Secondary | ICD-10-CM | POA: Diagnosis not present

## 2014-10-24 MED ORDER — CARVEDILOL 3.125 MG PO TABS: 3.1250 mg | ORAL_TABLET | Freq: Two times a day (BID) | ORAL | Status: DC

## 2014-10-24 NOTE — Patient Instructions (Signed)
START Carvedilol 3.125mg  twice a day  Your physician recommends that you schedule a follow-up appointment in: 4 weeks in the CHF clinic  Your physician recommends that you schedule a follow-up appointment in: 2-3 months with Dr.Nichan   Do the following things EVERYDAY: 1) Weigh yourself in the morning before breakfast. Write it down and keep it in a log. 2) Take your medicines as prescribed 3) Eat low salt foods-Limit salt (sodium) to 2000 mg per day.  4) Stay as active as you can everyday 5) Limit all fluids for the day to less than 2 liters 6)

## 2014-10-24 NOTE — Progress Notes (Signed)
Patient ID: [name], female   DOB: 1951/09/17, 63 y.o.   MRN: [id number]   [address], [address] [place], [place]  [address] Phone: [phone] Fax:  [phone]  Date:  10/24/2014   ID:  [name], DOB 04-May-1951, MRN [id number]  PCP:  Ailene Ards, CRNA  Cardiologist:  Dr. Jenkins Rouge  Electrophysiologist:  Dr. Thompson Grayer    History of Present Illness: [name] is a 63 y.o. female who is referred to the [facility] Clinic for evaluation for advanced therapies.   She has a hx of CAD, s/p prior Ant MI, s/p prior POBA to LAD, Dx, RCA, CHF due to ICM with high scar burden by MRI (12/09), s/p ICD, HTN, HL, depression, tobacco abuse.   ABI 7/14 R 0.93 L 0.95 MRI (12/09):  High scar burden, Inf and Apical AK, EF 24%.  Echo 10/14 EF 15% global HK. RV mild HK  Mod MR. Severe TR. PA 79mmHG   Lexiscan Myoview (11/14): LV Ejection Fraction: 24%. Extensive scar in the inferior wall, apex and anterior wall. Minimal reversibility Carotid Doppler : August 2015 showed 60-79% bilateral ICA stenosis. No previous CVA. Cath 07/26/14 Left main: Calcified 40% mid to distal stenosis LAD: Long vessel wrapping the apex 20-30% plaque in midsection. Distal vessel is small.  LCX: Large OM-1 and large OM-2. 50% lesion in proximal OM-2 RCA: Dominant vessel. Calcified, eccentric 50-60% lesion in midsection . LV-gram done in the RAO projection: Ejection fraction = 25% Severe global HK of mid to distal LV. No MR.    CPX 11/14 FVC 2.08 (67%)  FEV1 1.58 (66%)  FEV1/FVC 76%  Resting HR: 84 Peak HR: 142 (91% age predicted max HR) BP rest: 114/60 BP peak: 148/70 (IPE) Peak VO2: 15.0 (63% predicted peak VO2) VE/VCO2 slope: 32 OUES: 0.85 Peak RER: 1.33 Ventilatory Threshold: 12.1 (50.8% predicted peak VO2) VE/MVV: 60.1% O2pulse: 6 (75% predicted O2pulse)  Follow up: Here for routine f/u. At last visit had CP and underwent cath with EF 25% and non-obstructive CAD.  Returns for f/u.  Says breathing is fine. Can go to store and work in yard without CP or dyspnea. Gets SOB if she hurries. No edema, orthopnea or PND. Going to Lipid Clinic and working on tolerating crestor and zetia. Saw Dr. Fletcher Anon for bilateral calf claudication and has chosen conservative therapy with walking program over revascularization. Still smoking 6 cigs/day. Carvedilol stopped at last visit due to bradycardia  ICD interrogated personally. No VT/AF. Fluid crossing in October. Now baseline. Activity level 4+ hours per day. Mean HR 70-90  Labs (10/12):  K 3.2, creatinine 0.9, Hgb 13.5 Labs (9/14):    K 3.4, creatinine 1.0, pro BNP 1560, Hgb 11.8, HDL 25, LDL 60, ALT 29 Labs (10/14):  K 3.3=> 4.2, creatinine 1.2, BNP 1175 Labs (10/02/13) K 3.2 creatinine 0.9 Labs (12/13/13): LDL 134, AST 24, ALT 24, TC 221, TG104,  Labs (10/15): TC 171 TG 125 HDL 67 LDL 79   Wt Readings from Last 3 Encounters:  10/24/14 125 lb 4 oz (56.813 kg)  08/28/14 122 lb 6.4 oz (55.52 kg)  08/07/14 120 lb 1.9 oz (54.486 kg)     Past Medical History  Diagnosis Date  . Ischemic cardiomyopathy     MRI (12/09):  High scar burden, Inf and Apical AK, EF 24%.;  Echo (10/14): EF 15%, Gr 2 DD, mild to mod MR, mod LAE, RVSF mildly reduced, mod RAE, severe TR, PASP 49-53 (mod  pulmonary HTN)  . Chronic systolic heart failure   . HTN (hypertension)   . Depression   . MI (myocardial infarction)   . ICD (implantable cardiac defibrillator) in place   . Asthma   . H/O hiatal hernia   . Arthritis     hands  . CAD (coronary artery disease)     a. s/p prior Ant MI, b. s/p prior POBA to LAD, Dx, RCA,;  c.  LHC (10/12):  dLAD 40, pCFX 30, OM1 50, pRCA 30;  d.  Carlton Adam Myoview (11/14):  High risk study; inferior, anterior, apical defects; minimal reversibility toward the apex; consistent with prior infarct and very mild peri-infarct ischemia, EF 24%, inferior/apical/anterior akinesis  . HLD (hyperlipidemia)   . Tobacco abuse     Current  Outpatient Prescriptions  Medication Sig Dispense Refill  . aspirin 81 MG tablet Take 81 mg by mouth daily.      Marland Kitchen dexlansoprazole (DEXILANT) 60 MG capsule Take 60 mg by mouth daily.    Marland Kitchen ezetimibe (ZETIA) 10 MG tablet Take 5 mg by mouth every other day.    . fexofenadine (ALLEGRA) 180 MG tablet Take 180 mg by mouth daily as needed for allergies.     . fish oil-omega-3 fatty acids 1000 MG capsule Take 2 g by mouth daily.     . Fluticasone-Salmeterol (ADVAIR) 100-50 MCG/DOSE AEPB Inhale 1 puff into the lungs daily.     . furosemide (LASIX) 40 MG tablet Take 40-60 mg by mouth 2 (two) times daily. Take 60 mg by mouth in the morning and take 40 mg by mouth in the afternoon    . NON FORMULARY OJC, organic juice cleanse dietary supplement, taking every other day    . PARoxetine (PAXIL) 30 MG tablet Take 30 mg by mouth daily.     . polyethylene glycol (MIRALAX / GLYCOLAX) packet Take 17 g by mouth at bedtime.    . ramipril (ALTACE) 10 MG capsule Take 1 capsule (10 mg total) by mouth daily. 30 capsule 0  . rosuvastatin (CRESTOR) 5 MG tablet Take 5 mg by mouth 4 (four) times a week.    . spironolactone (ALDACTONE) 25 MG tablet TAKE 1 TABLET BY MOUTH EVERY DAY 30 tablet 4  . triamcinolone (NASACORT ALLERGY 24HR) 55 MCG/ACT AERO nasal inhaler Place 2 sprays into both nostrils daily.    . vitamin B-12 (CYANOCOBALAMIN) 250 MCG tablet Take 250 mcg by mouth daily.    . [DISCONTINUED] Budesonide (PULMICORT IN) Inhale into the lungs as directed.      . [DISCONTINUED] omeprazole (PRILOSEC) 10 MG capsule Take 10 mg by mouth daily.       No current facility-administered medications for this encounter.    Allergies:    Allergies  Allergen Reactions  . Statins Other (See Comments)    Gradually tired with daily Lipitor and Crestor 5 mg more than 4 times per week    Social History:  The patient  reports that she has been smoking Cigarettes.  She has been smoking about 0.00 packs per day. She has never used  smokeless tobacco. She reports that she does not drink alcohol or use illicit drugs.   ROS:  Please see the history of present illness.   All other systems reviewed and negative.   Filed Vitals:   10/24/14 1427  BP: 98/52  Pulse: 88  Weight: 125 lb 4 oz (56.813 kg)  SpO2: 98%    PHYSICAL EXAM:  Well nourished, well developed, in no acute  distress HEENT: normal Neck: no JVD+ bilateral L>R carotid bruit Cardiac:  normal S1, S2; brady regular; 1/6 systolic murmur LLSB Lungs:  Markedly decreased breath sounds bilaterally, long exp phase no rales, no wheezing  Abd: soft, nontender, no hepatomegaly Ext: no edema, cyanosis or clubbing. L DP non palpable. R DP 1+  Skin: warm and dry Neuro:  CNs 2-12 intact, no focal abnormalities noted  ECG: NSR 80 Previous anterior MI. No ST-T wave abnormalities.    ASSESSMENT AND PLAN:  1. CAD s/p previous anterior MI    --Cath 8/15 nonobstructive CAD 2.  Chronic Systolic CHF:  EF 0000000 (99991111) due to ICM with mild RV dysfunction s/p Medtronic ICD 3. COPD with ongoing tobacco abuse 4. PAD with Intermittent claudication 5. Bilateral carotid stenosis 6. H/o Symptomatic bradycardia 7. HTN 8. Hyperlipidemia with intolerance of multiple cholesterol meds   She is stable from a HF perspective. More limited by her COPD and claudication. Likely NYHA II. Volume status looks good on exam and Optivol. Will try to add back carvedilol 3.125 bid and see how her HR tolerates.   Lipids improved. Continue to follow in Lipid clinic.   Will need to f/u with Drs. Nishan and Arida for CAD and peripheral vascular disease.   Counseled on need to stop smoking.  Glori Bickers MD 2:54 PM

## 2014-10-25 NOTE — Addendum Note (Signed)
Encounter addended by: Georga Kaufmann, CCT on: 10/25/2014  8:47 AM<BR>     Documentation filed: Charges VN

## 2014-10-27 ENCOUNTER — Other Ambulatory Visit (HOSPITAL_COMMUNITY): Payer: Self-pay | Admitting: Internal Medicine

## 2014-10-27 DIAGNOSIS — I5022 Chronic systolic (congestive) heart failure: Secondary | ICD-10-CM

## 2014-11-13 ENCOUNTER — Encounter: Payer: Self-pay | Admitting: Internal Medicine

## 2014-11-15 ENCOUNTER — Encounter (HOSPITAL_COMMUNITY): Payer: Self-pay | Admitting: Internal Medicine

## 2014-11-23 ENCOUNTER — Ambulatory Visit (INDEPENDENT_AMBULATORY_CARE_PROVIDER_SITE_OTHER): Payer: Medicare Other | Admitting: *Deleted

## 2014-11-23 ENCOUNTER — Ambulatory Visit (HOSPITAL_COMMUNITY)
Admission: RE | Admit: 2014-11-23 | Discharge: 2014-11-23 | Disposition: A | Discharge status: Home / Self Care | Payer: Medicare Other | Source: Ambulatory Visit | Attending: Internal Medicine | Admitting: Internal Medicine

## 2014-11-23 ENCOUNTER — Ambulatory Visit: Payer: Medicare Other

## 2014-11-23 VITALS — BP 112/62 | HR 86 | Wt 123.5 lb

## 2014-11-23 DIAGNOSIS — F329 Major depressive disorder, single episode, unspecified: Secondary | ICD-10-CM | POA: Insufficient documentation

## 2014-11-23 DIAGNOSIS — Z79899 Other long term (current) drug therapy: Secondary | ICD-10-CM | POA: Insufficient documentation

## 2014-11-23 DIAGNOSIS — I739 Peripheral vascular disease, unspecified: Secondary | ICD-10-CM | POA: Insufficient documentation

## 2014-11-23 DIAGNOSIS — Z95810 Presence of automatic (implantable) cardiac defibrillator: Secondary | ICD-10-CM | POA: Diagnosis not present

## 2014-11-23 DIAGNOSIS — I6523 Occlusion and stenosis of bilateral carotid arteries: Secondary | ICD-10-CM | POA: Insufficient documentation

## 2014-11-23 DIAGNOSIS — Z7982 Long term (current) use of aspirin: Secondary | ICD-10-CM | POA: Insufficient documentation

## 2014-11-23 DIAGNOSIS — Z720 Tobacco use: Secondary | ICD-10-CM

## 2014-11-23 DIAGNOSIS — J449 Chronic obstructive pulmonary disease, unspecified: Secondary | ICD-10-CM | POA: Diagnosis not present

## 2014-11-23 DIAGNOSIS — I2510 Atherosclerotic heart disease of native coronary artery without angina pectoris: Secondary | ICD-10-CM

## 2014-11-23 DIAGNOSIS — F17210 Nicotine dependence, cigarettes, uncomplicated: Secondary | ICD-10-CM | POA: Diagnosis not present

## 2014-11-23 DIAGNOSIS — I252 Old myocardial infarction: Secondary | ICD-10-CM | POA: Insufficient documentation

## 2014-11-23 DIAGNOSIS — F17200 Nicotine dependence, unspecified, uncomplicated: Secondary | ICD-10-CM

## 2014-11-23 DIAGNOSIS — E785 Hyperlipidemia, unspecified: Secondary | ICD-10-CM | POA: Insufficient documentation

## 2014-11-23 DIAGNOSIS — I255 Ischemic cardiomyopathy: Secondary | ICD-10-CM | POA: Diagnosis not present

## 2014-11-23 DIAGNOSIS — J45909 Unspecified asthma, uncomplicated: Secondary | ICD-10-CM | POA: Diagnosis not present

## 2014-11-23 DIAGNOSIS — Z7951 Long term (current) use of inhaled steroids: Secondary | ICD-10-CM | POA: Insufficient documentation

## 2014-11-23 DIAGNOSIS — Z23 Encounter for immunization: Secondary | ICD-10-CM

## 2014-11-23 DIAGNOSIS — I10 Essential (primary) hypertension: Secondary | ICD-10-CM | POA: Insufficient documentation

## 2014-11-23 DIAGNOSIS — I5022 Chronic systolic (congestive) heart failure: Secondary | ICD-10-CM

## 2014-11-23 DIAGNOSIS — J438 Other emphysema: Secondary | ICD-10-CM

## 2014-11-23 LAB — BASIC METABOLIC PANEL
Anion gap: 14 (ref 5–15)
BUN: 16 mg/dL (ref 6–23)
CHLORIDE: 97 meq/L (ref 96–112)
CO2: 23 meq/L (ref 19–32)
Calcium: 9.7 mg/dL (ref 8.4–10.5)
Creatinine, Ser: 0.84 mg/dL (ref 0.50–1.10)
GFR calc Af Amer: 83 mL/min — ABNORMAL LOW (ref >90)
GFR calc non Af Amer: 71 mL/min — ABNORMAL LOW (ref >90)
Glucose, Bld: 99 mg/dL (ref 70–99)
POTASSIUM: 4.7 meq/L (ref 3.7–5.3)
Sodium: 134 mEq/L — ABNORMAL LOW (ref 137–147)

## 2014-11-23 LAB — PRO B NATRIURETIC PEPTIDE: Pro B Natriuretic peptide (BNP): 649.8 pg/mL — ABNORMAL HIGH (ref 0–125)

## 2014-11-23 MED ORDER — BISOPROLOL FUMARATE 5 MG PO TABS: 2.5000 mg | ORAL_TABLET | Freq: Every day | ORAL | Status: DC

## 2014-11-23 NOTE — Patient Instructions (Addendum)
Stop Carvedilol  Start Bisoprolol 2.5 mg (1/2 tab) daily  Labs today  Your physician has requested that you have an echocardiogram. Echocardiography is a painless test that uses sound waves to create images of your heart. It provides your doctor with information about the size and shape of your heart and how well your heart's chambers and valves are working. This procedure takes approximately one hour. There are no restrictions for this procedure.  We will contact you in 3 months to schedule your next appointment.

## 2014-11-25 NOTE — Progress Notes (Signed)
Patient ID: [name], female   DOB: 1951/01/18, 63 y.o.   MRN: [id number] [address], [address] [place],   [address] Phone: [phone] Fax:  [phone]  Date:  11/25/2014   ID:  [name], DOB 30-Nov-1951, MRN [id number]  PCP:  Ailene Ards, CRNA  Electrophysiologist:  Dr. Thompson Grayer    History of Present Illness: [name] is a 63 y.o. female who is referred to the HF Clinic for evaluation for advanced therapies.   She has a hx of CAD, s/p prior Ant MI, s/p prior POBA to LAD, Dx, RCA, CHF due to ICM with high scar burden by MRI (12/09), s/p ICD, HTN, HL, depression, tobacco abuse.   ABI 7/14 R 0.93 L 0.95 MRI (12/09):  High scar burden, Inf and Apical AK, EF 24%.  Echo 10/14 EF 15% global HK. RV mild HK  Mod MR. Severe TR. PA 44mmHG   Lexiscan Myoview (11/14): LV Ejection Fraction: 24%. Extensive scar in the inferior wall, apex and anterior wall. Minimal reversibility Carotid Doppler : August 2015 showed 60-79% bilateral ICA stenosis. No previous CVA. Cath 07/26/14 Left main: Calcified 40% mid to distal stenosis LAD: Long vessel wrapping the apex 20-30% plaque in midsection. Distal vessel is small.  LCX: Large OM-1 and large OM-2. 50% lesion in proximal OM-2 RCA: Dominant vessel. Calcified, eccentric 50-60% lesion in midsection . LV-gram done in the RAO projection: Ejection fraction = 25% Severe global HK of mid to distal LV. No MR.    CPX 11/14 FVC 2.08 (67%)  FEV1 1.58 (66%)  FEV1/FVC 76%  Resting HR: 84 Peak HR: 142 (91% age predicted max HR) BP rest: 114/60 BP peak: 148/70 (IPE) Peak VO2: 15.0 (63% predicted peak VO2) VE/VCO2 slope: 32 OUES: 0.85 Peak RER: 1.33 Ventilatory Threshold: 12.1 (50.8% predicted peak VO2) VE/MVV: 60.1% O2pulse: 6 (75% predicted O2pulse)  Follow up: Stable today.  Did not tolerate Coreg well due to fatigue, taking Coreg 3.125 mg every other day.  Had recent bronchitis but now doing well.  Minimal dyspnea  walking on flat ground.  No chest pain.  No orthopnea or PND.  Saw Dr. Fletcher Anon for bilateral calf claudication (after walking about 100 yards currently) and has chosen conservative therapy with walking program over revascularization. Still smoking 6 cigs/day. Carvedilol stopped at last visit due to bradycardia.   ICD interrogated personally. No VT/AF. Optivol showed fluid index < threshold, impedance stable.   Labs (10/12):  K 3.2, creatinine 0.9, Hgb 13.5 Labs (9/14):    K 3.4, creatinine 1.0, pro BNP 1560, Hgb 11.8, HDL 25, LDL 60, ALT 29 Labs (10/14):  K 3.3=> 4.2, creatinine 1.2, BNP 1175 Labs (10/02/13) K 3.2 creatinine 0.9 Labs (12/13/13): LDL 134, AST 24, ALT 24, TC 221, TG 104 Labs (8/15): K 5.1, creatinine 1.1  Labs (11/15): LDL 79, HDL 67  Wt Readings from Last 3 Encounters:  11/23/14 123 lb 8 oz (56.019 kg)  10/24/14 125 lb 4 oz (56.813 kg)  08/28/14 122 lb 6.4 oz (55.52 kg)     Past Medical History  Diagnosis Date  . Ischemic cardiomyopathy     MRI (12/09):  High scar burden, Inf and Apical AK, EF 24%.;  Echo (10/14): EF 15%, Gr 2 DD, mild to mod MR, mod LAE, RVSF mildly reduced, mod RAE, severe TR, PASP 49-53 (mod pulmonary HTN)  . Chronic systolic heart failure   . HTN (hypertension)   . Depression   . MI (  myocardial infarction)   . ICD (implantable cardiac defibrillator) in place   . Asthma   . H/O hiatal hernia   . Arthritis     hands  . CAD (coronary artery disease)     a. s/p prior Ant MI, b. s/p prior POBA to LAD, Dx, RCA,;  c.  LHC (10/12):  dLAD 40, pCFX 30, OM1 50, pRCA 30;  d.  Carlton Adam Myoview (11/14):  High risk study; inferior, anterior, apical defects; minimal reversibility toward the apex; consistent with prior infarct and very mild peri-infarct ischemia, EF 24%, inferior/apical/anterior akinesis  . HLD (hyperlipidemia)   . Tobacco abuse     Current Outpatient Prescriptions  Medication Sig Dispense Refill  . aspirin 81 MG tablet Take 81 mg by mouth  daily.      Marland Kitchen dexlansoprazole (DEXILANT) 60 MG capsule Take 60 mg by mouth daily.    Marland Kitchen ezetimibe (ZETIA) 10 MG tablet Take 5 mg by mouth every other day.    . fexofenadine (ALLEGRA) 180 MG tablet Take 180 mg by mouth daily as needed for allergies.     . fish oil-omega-3 fatty acids 1000 MG capsule Take 2 g by mouth daily.     . Fluticasone-Salmeterol (ADVAIR) 100-50 MCG/DOSE AEPB Inhale 1 puff into the lungs daily.     . furosemide (LASIX) 20 MG tablet Take 60 mg in the AM and 40 mg in the PM 90 tablet 3  . NON FORMULARY OJC, organic juice cleanse dietary supplement, taking every other day    . PARoxetine (PAXIL) 30 MG tablet Take 30 mg by mouth daily.     . polyethylene glycol (MIRALAX / GLYCOLAX) packet Take 17 g by mouth at bedtime.    . ramipril (ALTACE) 10 MG capsule Take 1 capsule (10 mg total) by mouth daily. 30 capsule 0  . rosuvastatin (CRESTOR) 5 MG tablet Take 5 mg by mouth 4 (four) times a week.    . spironolactone (ALDACTONE) 25 MG tablet TAKE 1 TABLET BY MOUTH EVERY DAY 30 tablet 4  . triamcinolone (NASACORT ALLERGY 24HR) 55 MCG/ACT AERO nasal inhaler Place 2 sprays into both nostrils daily.    . vitamin B-12 (CYANOCOBALAMIN) 250 MCG tablet Take 250 mcg by mouth daily.    . bisoprolol (ZEBETA) 5 MG tablet Take 0.5 tablets (2.5 mg total) by mouth daily. 15 tablet 6  . [DISCONTINUED] Budesonide (PULMICORT IN) Inhale into the lungs as directed.      . [DISCONTINUED] omeprazole (PRILOSEC) 10 MG capsule Take 10 mg by mouth daily.       No current facility-administered medications for this encounter.    Allergies:    Allergies  Allergen Reactions  . Statins Other (See Comments)    Gradually tired with daily Lipitor and Crestor 5 mg more than 4 times per week    Social History:  The patient  reports that she has been smoking Cigarettes.  She has been smoking about 0.00 packs per day. She has never used smokeless tobacco. She reports that she does not drink alcohol or use illicit  drugs.   ROS:  Please see the history of present illness.   All other systems reviewed and negative.   Filed Vitals:   11/23/14 0916  BP: 112/62  Pulse: 86  Weight: 123 lb 8 oz (56.019 kg)  SpO2: 97%    PHYSICAL EXAM:  Well nourished, well developed, in no acute distress HEENT: normal Neck: no JVD+ bilateral L>R carotid bruit Cardiac:  normal S1, S2;  brady regular; 1/6 systolic murmur LLSB Lungs:  Markedly decreased breath sounds bilaterally, long exp phase no rales, no wheezing  Abd: soft, nontender, no hepatomegaly Ext: no edema, cyanosis or clubbing. L DP non palpable. R DP 1+  Skin: warm and dry Neuro:  CNs 2-12 intact, no focal abnormalities noted  ASSESSMENT AND PLAN:  1. CAD s/p previous anterior MI:  Cath 8/15 nonobstructive CAD.  No chest pain.  Continue ASA and statin.  2.  Chronic Systolic CHF:  EF 0000000 (99991111) due to ischemic cardiomyopathy with mild RV dysfunction s/p Medtronic ICD.  Optivol and exam show good volume status.   - Continue ramipril and spironolactone at current doses.  - She is unable to tolerate Coreg.  I will try her on bisoprolol 2.5 mg daily instead of Coreg.  She has COPD and bisoprolol is more beta-1 selective so it may be more tolerable for her.  - BMET/BNP today.  3. COPD with ongoing tobacco abuse: I strongly encouraged her to quit smoking.  4. PAD with Intermittent claudication: Continue efforts to walk through the pain for at least short distances.  5. Bilateral carotid stenosis 6. H/o symptomatic bradycardia: HR 86 today, follow closely on bisoprolol.  7. HTN: BP not elevated.  8. Hyperlipidemia with intolerance of multiple cholesterol meds: Tolerating Crestor 5 mg four times/week.  Recent lipids looked good.   Loralie Champagne 11/25/2014

## 2014-12-12 ENCOUNTER — Ambulatory Visit (HOSPITAL_COMMUNITY)
Admission: RE | Admit: 2014-12-12 | Discharge: 2014-12-12 | Disposition: A | Discharge status: Home / Self Care | Payer: Medicare Other | Source: Ambulatory Visit | Attending: Cardiology | Admitting: Cardiology

## 2014-12-12 DIAGNOSIS — I509 Heart failure, unspecified: Secondary | ICD-10-CM | POA: Diagnosis not present

## 2014-12-12 DIAGNOSIS — I10 Essential (primary) hypertension: Secondary | ICD-10-CM | POA: Diagnosis not present

## 2014-12-12 DIAGNOSIS — I5022 Chronic systolic (congestive) heart failure: Secondary | ICD-10-CM

## 2014-12-12 DIAGNOSIS — Z720 Tobacco use: Secondary | ICD-10-CM | POA: Diagnosis not present

## 2014-12-12 DIAGNOSIS — I059 Rheumatic mitral valve disease, unspecified: Secondary | ICD-10-CM

## 2014-12-12 NOTE — Progress Notes (Signed)
Echocardiogram 2D Echocardiogram has been performed.  [name] 12/12/2014, 10:29 AM

## 2014-12-17 ENCOUNTER — Telehealth (HOSPITAL_COMMUNITY): Payer: Self-pay | Admitting: *Deleted

## 2014-12-17 NOTE — Telephone Encounter (Signed)
Pt given echo results, at last OV she was started on Bisoprolol 2.5 mg, she states she can not tolerate this medication that it is causing her to feel very fatigued and lightheaded, pt was previously unable to tolerate Carvedilol, advised pt to stop medication and we will let Dr Aundra Dubin know

## 2014-12-17 NOTE — Telephone Encounter (Signed)
Before she stops it, would have her try to take it at bedtime and see if she can tolerate it that way.

## 2014-12-17 NOTE — Telephone Encounter (Signed)
-----   Message from Larey Dresser, MD sent at 12/14/2014  5:05 PM EST ----- EF 25%, mildly higher than in past.

## 2014-12-18 ENCOUNTER — Encounter: Payer: Self-pay | Admitting: Internal Medicine

## 2014-12-26 MED ORDER — BISOPROLOL FUMARATE 5 MG PO TABS: 2.5000 mg | ORAL_TABLET | Freq: Every day | ORAL | Status: DC

## 2014-12-26 NOTE — Addendum Note (Signed)
Addended by: Scarlette Calico on: 12/26/2014 11:54 AM   Modules accepted: Orders

## 2014-12-26 NOTE — Telephone Encounter (Signed)
Pt is aware and agreeable, if unable to tolerate at night she will let us know

## 2015-01-08 ENCOUNTER — Other Ambulatory Visit (INDEPENDENT_AMBULATORY_CARE_PROVIDER_SITE_OTHER): Payer: Medicare Other | Admitting: *Deleted

## 2015-01-08 DIAGNOSIS — E785 Hyperlipidemia, unspecified: Secondary | ICD-10-CM

## 2015-01-08 LAB — HEPATIC FUNCTION PANEL
ALT: 20 U/L (ref 0–35)
AST: 17 U/L (ref 0–37)
Albumin: 4.3 g/dL (ref 3.5–5.2)
Alkaline Phosphatase: 92 U/L (ref 39–117)
Bilirubin, Direct: 0.2 mg/dL (ref 0.0–0.3)
Total Bilirubin: 0.8 mg/dL (ref 0.2–1.2)
Total Protein: 7 g/dL (ref 6.0–8.3)

## 2015-01-08 LAB — LIPID PANEL
CHOLESTEROL: 152 mg/dL (ref 0–200)
HDL: 59 mg/dL (ref >39.00)
LDL Cholesterol: 69 mg/dL (ref 0–99)
NONHDL: 93
TRIGLYCERIDES: 118 mg/dL (ref 0.0–149.0)
Total CHOL/HDL Ratio: 3
VLDL: 23.6 mg/dL (ref 0.0–40.0)

## 2015-01-15 ENCOUNTER — Ambulatory Visit (INDEPENDENT_AMBULATORY_CARE_PROVIDER_SITE_OTHER): Payer: Medicare Other | Admitting: Pharmacist

## 2015-01-15 DIAGNOSIS — E785 Hyperlipidemia, unspecified: Secondary | ICD-10-CM

## 2015-01-15 DIAGNOSIS — I6523 Occlusion and stenosis of bilateral carotid arteries: Secondary | ICD-10-CM | POA: Diagnosis not present

## 2015-01-15 NOTE — Progress Notes (Signed)
[name] is a 64 yo F referred by Dr. Johnsie Cancel for follow up lipid management. Her PMH is significant for CAD s/p stenting x 2 in 2007, HFrEF stage 3, HTN, HLD, PAD, tobacco use.  She is currently being treated with Zetia 5mg  QOD, fish oil 2g QD and Crestor 5mg  4x/week without any intolerable side effects.   FH: none significant Maternal grandmother -- stroke, mother -- stroke at 74 yo  SH: current tobacco smoker (6 cigarettes/day -- has tried patches, gum and e-cigarettes), no alcohol  Diet: decaf sweet tea, some flavored water, juice cleanse (every other day), decaf coffee   Breakfast: cheerios, raisin brain, 2% milk   Dinner: chicken and dumplings, stuffed peppers (ground beef, rice, onions, tomatoes), grilled vegetables (onions, carrots)   Exercise: More frequent in summer months. Currently doing housework, gardening, yardwork. Has airwalker at home but not doing much currently.  Labs: 01/08/15: totalchol 152, TG 118, HDL 59, LDL 69 (Zetia 5mg  QOD, fish oil 2g/d, Crestor 5mg  QIW) 10/19/14: totalchol 171, TG 126, HDL 67, LDL 79 (Zetia 5mg  QOD, fish oil 2g/d, Crestor 5mg  QIW)  Allergies  Allergen Reactions  . Statins Other (See Comments)    Gradually tired with daily Lipitor and Crestor 5 mg more than 4 times per week   Current Outpatient Prescriptions  Medication Sig Dispense Refill  . aspirin 81 MG tablet Take 81 mg by mouth daily.      . bisoprolol (ZEBETA) 5 MG tablet Take 0.5 tablets (2.5 mg total) by mouth at bedtime.    Marland Kitchen dexlansoprazole (DEXILANT) 60 MG capsule Take 60 mg by mouth daily.    Marland Kitchen ezetimibe (ZETIA) 10 MG tablet Take 5 mg by mouth every other day.    . fexofenadine (ALLEGRA) 180 MG tablet Take 180 mg by mouth daily as needed for allergies.     . fish oil-omega-3 fatty acids 1000 MG capsule Take 2 g by mouth daily.     . Fluticasone-Salmeterol (ADVAIR) 100-50 MCG/DOSE AEPB Inhale 1 puff into the lungs daily.     . furosemide (LASIX) 20 MG tablet Take 60 mg in the AM  and 40 mg in the PM 90 tablet 3  . NON FORMULARY OJC, organic juice cleanse dietary supplement, taking every other day    . PARoxetine (PAXIL) 30 MG tablet Take 30 mg by mouth daily.     . polyethylene glycol (MIRALAX / GLYCOLAX) packet Take 17 g by mouth at bedtime.    . ramipril (ALTACE) 10 MG capsule Take 1 capsule (10 mg total) by mouth daily. 30 capsule 0  . rosuvastatin (CRESTOR) 5 MG tablet Take 5 mg by mouth 4 (four) times a week.    . spironolactone (ALDACTONE) 25 MG tablet TAKE 1 TABLET BY MOUTH EVERY DAY 30 tablet 4  . triamcinolone (NASACORT ALLERGY 24HR) 55 MCG/ACT AERO nasal inhaler Place 2 sprays into both nostrils daily.    . vitamin B-12 (CYANOCOBALAMIN) 250 MCG tablet Take 250 mcg by mouth daily.    . [DISCONTINUED] Budesonide (PULMICORT IN) Inhale into the lungs as directed.      . [DISCONTINUED] omeprazole (PRILOSEC) 10 MG capsule Take 10 mg by mouth daily.       No current facility-administered medications for this visit.    A/P: Based on Ms. Santmyer current lipid panel, she is at her goal LDL <70 and non-HDL <100 on her current regimen of Zetia 5mg  QOD, fish oil 2g/d and Crestor 5mg  QIW. We encouraged her to increase her intake  of lean proteins (i.e. Use ground [place] instead of ground beef) and fiber as well as increasing cardiovascular exercises. We discussed the benefits associated with smoking cessation and encouraged her to speak with her PCP about other smoking cessation agents that she has not tried in the past. Since her lipid panel is well controlled at this time with her current regimen, we will reassess a fasting lipid panel in 6 months time and schedule an appointment if needed. At the end of our visit, she did express concerns with the cost of her copays. Through the [employer], she was approved for copay assistance with her Zetia and Crestor for up $12,500 from 10/17/14 through 01/15/2016.   [employer] assistance information: ID: [id number] BIN: [id number] Rx  Group: [id number] PCN: [id number]  Ruta Hinds. Velva Harman, PharmD Clinical Pharmacist - Resident Pager: [phone] Pharmacy: [phone] 01/15/2015 12:29 PM

## 2015-01-15 NOTE — Patient Instructions (Signed)
It was great to meet you today!  Based on your recent cholesterol panel, please continue taking Crestor, Zetia and fish oil. We will work on patient assistance programs and follow up with you.  We will recheck your cholesterol in 6 months.

## 2015-01-16 NOTE — Progress Notes (Signed)
Agree with plan 

## 2015-01-21 ENCOUNTER — Other Ambulatory Visit (HOSPITAL_COMMUNITY): Payer: Self-pay | Admitting: *Deleted

## 2015-01-21 MED ORDER — SPIRONOLACTONE 25 MG PO TABS: 25.0000 mg | ORAL_TABLET | Freq: Every day | ORAL | Status: DC

## 2015-01-23 ENCOUNTER — Other Ambulatory Visit (HOSPITAL_COMMUNITY): Payer: Self-pay | Admitting: Cardiology

## 2015-01-23 ENCOUNTER — Other Ambulatory Visit: Payer: Self-pay | Admitting: Cardiovascular Disease

## 2015-01-23 DIAGNOSIS — I5022 Chronic systolic (congestive) heart failure: Secondary | ICD-10-CM

## 2015-01-23 MED ORDER — FUROSEMIDE 20 MG PO TABS: ORAL_TABLET | ORAL | Status: DC

## 2015-01-25 ENCOUNTER — Other Ambulatory Visit: Payer: Self-pay | Admitting: Cardiovascular Disease

## 2015-01-31 ENCOUNTER — Encounter: Payer: Self-pay | Admitting: Internal Medicine

## 2015-01-31 ENCOUNTER — Other Ambulatory Visit (HOSPITAL_COMMUNITY): Payer: Self-pay | Admitting: Cardiology

## 2015-01-31 ENCOUNTER — Ambulatory Visit (INDEPENDENT_AMBULATORY_CARE_PROVIDER_SITE_OTHER): Payer: Medicare Other | Admitting: Internal Medicine

## 2015-01-31 VITALS — BP 108/52 | HR 92 | Ht 63.5 in | Wt 127.4 lb

## 2015-01-31 DIAGNOSIS — I6523 Occlusion and stenosis of bilateral carotid arteries: Secondary | ICD-10-CM

## 2015-01-31 DIAGNOSIS — I5022 Chronic systolic (congestive) heart failure: Secondary | ICD-10-CM

## 2015-01-31 DIAGNOSIS — Z720 Tobacco use: Secondary | ICD-10-CM

## 2015-01-31 LAB — MDC_IDC_ENUM_SESS_TYPE_INCLINIC
Date Time Interrogation Session: 20160225112338
HIGH POWER IMPEDANCE MEASURED VALUE: 82 Ohm
Lead Channel Impedance Value: 624 Ohm
Lead Channel Pacing Threshold Amplitude: 0.5 V
Lead Channel Sensing Intrinsic Amplitude: 19.3264
Lead Channel Setting Pacing Amplitude: 2.5 V
Lead Channel Setting Pacing Pulse Width: 0.4 ms
MDC IDC MSMT BATTERY VOLTAGE: 3.06 V
MDC IDC MSMT LEADCHNL RV PACING THRESHOLD PULSEWIDTH: 0.4 ms
MDC IDC SET LEADCHNL RV SENSING SENSITIVITY: 0.3 mV
MDC IDC STAT BRADY RV PERCENT PACED: 2.67 %
Zone Setting Detection Interval: 300 ms
Zone Setting Detection Interval: 400 ms
Zone Setting Detection Interval: 450 ms

## 2015-01-31 NOTE — Patient Instructions (Signed)
Your physician wants you to follow-up in: 12 months with Dr Vallery Ridge will receive a reminder letter in the mail two months in advance. If you don't receive a letter, please call our office to schedule the follow-up appointment.  Remote monitoring is used to monitor your Pacemaker or ICD from home. This monitoring reduces the number of office visits required to check your device to one time per year. It allows [place] to keep an eye on the functioning of your device to ensure it is working properly. You are scheduled for a device check from home on 05/02/15. You may send your transmission at any time that day. If you have a wireless device, the transmission will be sent automatically. After your physician reviews your transmission, you will receive a postcard with your next transmission date.

## 2015-01-31 NOTE — Progress Notes (Signed)
Electrophysiology Office Note   Date:  01/31/2015   ID:  TAKALA GIAUQUE, DOB [birth year], MRN [id number]  PCP:  Pcp Not In System  Cardiologist:  Dr Johnsie Cancel Dr Haroldine Laws follows for CHF Primary Electrophysiologist: Thompson Grayer, MD    Chief Complaint  Patient presents with  . Follow-up    Other primary cardiomyopathies & CHF     History of Present Illness: [name] is a 64 y.o. female who presents today for electrophysiology evaluation.   She has not been seen by me in several years.  She has been followed recently in the CHF clinic and feels that her CHF continues to improve.  She remains active.  She still smokes 6 cigarettes per day but is trying to quit.  Today, she denies symptoms of palpitations, chest pain, shortness of breath, orthopnea, PND, lower extremity edema, claudication, dizziness, presyncope, syncope, bleeding, or neurologic sequela. The patient is tolerating medications without difficulties and is otherwise without complaint today.    Past Medical History  Diagnosis Date  . Ischemic cardiomyopathy     MRI (12/09):  High scar burden, Inf and Apical AK, EF 24%.;  Echo (10/14): EF 15%, Gr 2 DD, mild to mod MR, mod LAE, RVSF mildly reduced, mod RAE, severe TR, PASP 49-53 (mod pulmonary HTN)  . Chronic systolic heart failure   . HTN (hypertension)   . Depression   . MI (myocardial infarction)   . ICD (implantable cardiac defibrillator) in place   . Asthma   . H/O hiatal hernia   . Arthritis     hands  . CAD (coronary artery disease)     a. s/p prior Ant MI, b. s/p prior POBA to LAD, Dx, RCA,;  c.  LHC (10/12):  dLAD 40, pCFX 30, OM1 50, pRCA 30;  d.  Carlton Adam Myoview (11/14):  High risk study; inferior, anterior, apical defects; minimal reversibility toward the apex; consistent with prior infarct and very mild peri-infarct ischemia, EF 24%, inferior/apical/anterior akinesis  . HLD (hyperlipidemia)   . Tobacco abuse    Past Surgical History  Procedure  Laterality Date  . Cardiac defibrillator placement  01/18/09    MDT implanted by JA  . Insert / replace / remove pacemaker      "pacemaker is not on"  . Coronary angioplasty    . Cardiac stents    . Colonoscopy  03/18/2012    Procedure: COLONOSCOPY;  Surgeon: Beryle Beams, MD;  Location: [facility] ENDOSCOPY;  Service: Endoscopy;  Laterality: N/A;  . Esophagogastroduodenoscopy N/A 03/10/2013    Procedure: ESOPHAGOGASTRODUODENOSCOPY (EGD);  Surgeon: Beryle Beams, MD;  Location: [facility];  Service: Endoscopy;  Laterality: N/A;  . Left heart catheterization with coronary angiogram N/A 07/26/2014    Procedure: LEFT HEART CATHETERIZATION WITH CORONARY ANGIOGRAM;  Surgeon: Jolaine Artist, MD;  Location: [facility] CATH LAB;  Service: Cardiovascular;  Laterality: N/A;     Current Outpatient Prescriptions  Medication Sig Dispense Refill  . aspirin 81 MG tablet Take 81 mg by mouth daily.      Marland Kitchen dexlansoprazole (DEXILANT) 60 MG capsule Take 60 mg by mouth daily.    Marland Kitchen ezetimibe (ZETIA) 10 MG tablet Take 5 mg by mouth every other day.    . fexofenadine (ALLEGRA) 180 MG tablet Take 180 mg by mouth daily as needed for allergies.     . fish oil-omega-3 fatty acids 1000 MG capsule Take 2 g by mouth daily.     . Fluticasone-Salmeterol (ADVAIR) 100-50 MCG/DOSE AEPB  Inhale 1 puff into the lungs daily.     . furosemide (LASIX) 20 MG tablet Take 60 mg in the AM and 40 mg in the PM (Patient taking differently: Take 60 mg by mouth in the AM and 40 mg by mouth in the PM) 150 tablet 3  . NON FORMULARY OJC, organic juice cleanse dietary supplement, taking by mouth every other day    . PARoxetine (PAXIL) 30 MG tablet Take 30 mg by mouth daily.     . polyethylene glycol (MIRALAX / GLYCOLAX) packet Take 17 g by mouth at bedtime.    . ramipril (ALTACE) 10 MG capsule TAKE 1 CAPSULE (10 MG TOTAL) BY MOUTH DAILY. 30 capsule 3  . rosuvastatin (CRESTOR) 5 MG tablet Take 5 mg by mouth 4 (four) times a week.    . spironolactone  (ALDACTONE) 25 MG tablet Take 1 tablet (25 mg total) by mouth daily. 30 tablet 4  . triamcinolone (NASACORT ALLERGY 24HR) 55 MCG/ACT AERO nasal inhaler Place 2 sprays into both nostrils daily.    . vitamin B-12 (CYANOCOBALAMIN) 250 MCG tablet Take 250 mcg by mouth daily.    . [DISCONTINUED] Budesonide (PULMICORT IN) Inhale into the lungs as directed.      . [DISCONTINUED] omeprazole (PRILOSEC) 10 MG capsule Take 10 mg by mouth daily.       No current facility-administered medications for this visit.    Allergies:   Zebeta and Statins   Social History:  The patient  reports that she has been smoking Cigarettes.  She has never used smokeless tobacco. She reports that she does not drink alcohol or use illicit drugs.   ROS:  Please see the history of present illness.   All other systems are reviewed and negative.    PHYSICAL EXAM: VS:  BP 108/52 mmHg  Pulse 92  Ht 5' 3.5" (1.613 m)  Wt 127 lb 6.4 oz (57.788 kg)  BMI 22.21 kg/m2 , BMI Body mass index is 22.21 kg/(m^2). GEN: Well nourished, well developed, in no acute distress HEENT: normal Neck: no JVD, carotid bruits, or masses Cardiac: RRR; no murmurs, rubs, or gallops,no edema  Respiratory:  clear to auscultation bilaterally, normal work of breathing GI: soft, nontender, nondistended, + BS MS: no deformity or atrophy Skin: warm and dry, device pocket is well healed Neuro:  Strength and sensation are intact Psych: euthymic mood, full affect  Device interrogation is reviewed today in detail.  See PaceArt for details.   Recent Labs: 07/25/2014: Hemoglobin 12.8; Platelets 204; TSH 1.630 11/23/2014: BUN 16; Creatinine 0.84; Potassium 4.7; Pro B Natriuretic peptide (BNP) 649.8*; Sodium 134* 01/08/2015: ALT 20    Lipid Panel     Component Value Date/Time   CHOL 152 01/08/2015 1043   TRIG 118.0 01/08/2015 1043   HDL 59.00 01/08/2015 1043   CHOLHDL 3 01/08/2015 1043   VLDL 23.6 01/08/2015 1043   LDLCALC 69 01/08/2015 1043    LDLDIRECT 134.7 12/13/2013 1043     Wt Readings from Last 3 Encounters:  01/31/15 127 lb 6.4 oz (57.788 kg)  10/24/14 125 lb 4 oz (56.813 kg)  08/28/14 122 lb 6.4 oz (55.52 kg)      Other studies Reviewed: Additional studies/ records that were reviewed today include: CHF clinic note and lipid clinic note     ASSESSMENT AND PLAN:  1.  Ischemic cm/ CAD/ chronic systolic dysfunction Normal ICDfunction See Pace Art report No changes today No ischemic symptoms, CHF is improved No changes today  2.  Tobacco Cessation advised She it not ready to quit  carelink Return to see me in 1 year    Current medicines are reviewed at length with the patient today.   The patient does not have concerns regarding her medicines.  The following changes were made today:  none  Signed, Thompson Grayer, MD  01/31/2015 11:26 AM     [facility] [address] [place] [address] [phone] (office) [phone] (fax)

## 2015-02-08 ENCOUNTER — Ambulatory Visit (HOSPITAL_COMMUNITY): Payer: Medicare Other | Attending: Cardiology | Admitting: Cardiology

## 2015-02-08 DIAGNOSIS — I10 Essential (primary) hypertension: Secondary | ICD-10-CM | POA: Diagnosis not present

## 2015-02-08 DIAGNOSIS — I2510 Atherosclerotic heart disease of native coronary artery without angina pectoris: Secondary | ICD-10-CM | POA: Insufficient documentation

## 2015-02-08 DIAGNOSIS — E785 Hyperlipidemia, unspecified: Secondary | ICD-10-CM | POA: Insufficient documentation

## 2015-02-08 DIAGNOSIS — I6523 Occlusion and stenosis of bilateral carotid arteries: Secondary | ICD-10-CM | POA: Diagnosis not present

## 2015-02-08 DIAGNOSIS — Z720 Tobacco use: Secondary | ICD-10-CM | POA: Diagnosis not present

## 2015-02-08 NOTE — Progress Notes (Signed)
Carotid duplex performed 

## 2015-02-18 ENCOUNTER — Encounter: Payer: Self-pay | Admitting: Internal Medicine

## 2015-02-24 NOTE — Progress Notes (Signed)
Patient ID: [name], female   DOB: [birth year], 64 y.o.   MRN: [id number] [name] is a 64 y.o. Marland Kitchen female f/u CHF and vascular disease  Sees Dr [name] for AICD,  DB for CHF and Dr [name] for PVD  She has a hx of CAD, s/p prior Ant MI, s/p prior POBA to LAD, Dx, RCA, CHF due to ICM with EF 24% with high scar burden by MRI (12/09), s/p ICD, HTN, HL, depression, tobacco abuse.   Studies Reviewed Multiple   ABI 7/14 R 0.93 L 0.95  LHC (10/12): dLAD 40, pCFX 30, OM1 50, pRCA 30.  MRI (12/09): High scar burden, Inf and Apical AK, EF 24%.  Echo 10/14 EF 15% global HK. RV mild HK Mod MR. Severe TR. PA 44mmHG  Lexiscan Myoview (11/14): LV Ejection Fraction: 24%. Extensive scar in the inferior wall, apex and anterior wall. 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  All other systems reviewed and negative  General: Affect appropriate Chronically ill white female  HEENT: normal Neck supple with no adenopathy JVP normal bilateral bruits no thyromegaly  AICD under left clavicle Lungs clear with no wheezing and good diaphragmatic motion Heart:  S1/S2 no murmur, no rub, gallop or click PMI normal Abdomen: benighn, BS positve, no tenderness, no AAA Femoral  bruit.  No HSM or HJR Distal  pulses reduced  No edema Neuro non-focal Skin warm and dry No muscular weakness   Current Outpatient Prescriptions  Medication Sig Dispense Refill  . aspirin 81 MG tablet Take 81 mg by mouth daily.      Marland Kitchen dexlansoprazole (DEXILANT) 60 MG capsule Take 60 mg by mouth daily.    Marland Kitchen ezetimibe (ZETIA) 10 MG tablet Take 5 mg by mouth every other day.    . fexofenadine (ALLEGRA) 180 MG tablet Take 180 mg by mouth daily as needed for allergies.     . fish oil-omega-3 fatty acids 1000 MG capsule Take 2 g by mouth daily.     . Fluticasone-Salmeterol (ADVAIR) 100-50 MCG/DOSE AEPB Inhale 1 puff into the lungs daily.     . furosemide (LASIX) 20 MG tablet Take 60 mg in the AM and 40 mg in the PM (Patient taking differently: Take 60 mg by mouth in the AM and 40 mg by mouth in the PM) 150 tablet 3  . NON FORMULARY OJC, organic juice cleanse dietary supplement, taking by mouth every other day    . PARoxetine (PAXIL) 30 MG tablet Take 30 mg by mouth daily.     . polyethylene glycol (MIRALAX / GLYCOLAX) packet Take 17 g by mouth at bedtime.    . ramipril (ALTACE) 10 MG capsule TAKE 1 CAPSULE (10 MG TOTAL) BY MOUTH DAILY. 30 capsule 3  . rosuvastatin (CRESTOR) 5 MG tablet Take 5  mg by mouth 4 (four) times a week.    . spironolactone (ALDACTONE) 25 MG tablet Take 1 tablet (25 mg total) by mouth daily. 30 tablet 4  . triamcinolone (NASACORT ALLERGY 24HR) 55 MCG/ACT AERO nasal inhaler Place 2 sprays into both nostrils daily.    . vitamin B-12 (CYANOCOBALAMIN) 250 MCG tablet Take 250 mcg by mouth daily.    . [DISCONTINUED] Budesonide (PULMICORT IN) Inhale into the lungs as directed.      . [DISCONTINUED] omeprazole (PRILOSEC) 10 MG capsule Take 10 mg by mouth daily.       No current facility-administered medications for this visit.    Allergies  Zebeta and Statins  Electrocardiogram:  SR rate 73 narrow QRS old anterior MI  10/24/14   Assessment and Plan

## 2015-02-25 ENCOUNTER — Encounter: Payer: Medicare Other | Admitting: Cardiovascular Disease

## 2015-02-27 ENCOUNTER — Encounter: Payer: Self-pay | Admitting: Cardiovascular Disease

## 2015-02-27 ENCOUNTER — Ambulatory Visit (INDEPENDENT_AMBULATORY_CARE_PROVIDER_SITE_OTHER): Payer: Medicare Other | Admitting: Cardiovascular Disease

## 2015-02-27 ENCOUNTER — Ambulatory Visit (INDEPENDENT_AMBULATORY_CARE_PROVIDER_SITE_OTHER)
Admission: RE | Admit: 2015-02-27 | Discharge: 2015-02-27 | Disposition: A | Discharge status: Home / Self Care | Payer: Medicare Other | Source: Ambulatory Visit | Attending: Cardiovascular Disease | Admitting: Cardiovascular Disease

## 2015-02-27 VITALS — BP 102/36 | HR 95 | Ht 63.0 in | Wt 124.0 lb

## 2015-02-27 DIAGNOSIS — I6523 Occlusion and stenosis of bilateral carotid arteries: Secondary | ICD-10-CM

## 2015-02-27 DIAGNOSIS — I509 Heart failure, unspecified: Secondary | ICD-10-CM | POA: Diagnosis not present

## 2015-02-27 DIAGNOSIS — T148 Other injury of unspecified body region: Secondary | ICD-10-CM | POA: Diagnosis not present

## 2015-02-27 DIAGNOSIS — T148XXA Other injury of unspecified body region, initial encounter: Secondary | ICD-10-CM

## 2015-02-27 DIAGNOSIS — Z0000 Encounter for general adult medical examination without abnormal findings: Secondary | ICD-10-CM

## 2015-02-27 LAB — CBC WITH DIFFERENTIAL/PLATELET
Basophils Absolute: 0 10*3/uL (ref 0.0–0.1)
Basophils Relative: 0.4 % (ref 0.0–3.0)
EOS ABS: 0.1 10*3/uL (ref 0.0–0.7)
Eosinophils Relative: 1 % (ref 0.0–5.0)
HCT: 35.3 % — ABNORMAL LOW (ref 36.0–46.0)
Hemoglobin: 12.1 g/dL (ref 12.0–15.0)
Lymphocytes Relative: 16.1 % (ref 12.0–46.0)
Lymphs Abs: 1.4 10*3/uL (ref 0.7–4.0)
MCHC: 34.4 g/dL (ref 30.0–36.0)
MCV: 95.9 fl (ref 78.0–100.0)
MONO ABS: 0.6 10*3/uL (ref 0.1–1.0)
Monocytes Relative: 6.8 % (ref 3.0–12.0)
Neutro Abs: 6.5 10*3/uL (ref 1.4–7.7)
Neutrophils Relative %: 75.7 % (ref 43.0–77.0)
PLATELETS: 193 10*3/uL (ref 150.0–400.0)
RBC: 3.68 Mil/uL — AB (ref 3.87–5.11)
RDW: 13.6 % (ref 11.5–15.5)
WBC: 8.6 10*3/uL (ref 4.0–10.5)

## 2015-02-27 LAB — BASIC METABOLIC PANEL
BUN: 18 mg/dL (ref 6–23)
CO2: 28 mEq/L (ref 19–32)
Calcium: 9.1 mg/dL (ref 8.4–10.5)
Chloride: 103 mEq/L (ref 96–112)
Creatinine, Ser: 1.13 mg/dL (ref 0.40–1.20)
GFR: 51.2 mL/min — AB (ref >60.00)
Glucose, Bld: 125 mg/dL — ABNORMAL HIGH (ref 70–99)
Potassium: 3.9 mEq/L (ref 3.5–5.1)
SODIUM: 135 meq/L (ref 135–145)

## 2015-02-27 NOTE — Patient Instructions (Signed)
Your physician wants you to follow-up in: [place] will receive a reminder letter in the mail two months in advance. If you don't receive a letter, please call our office to schedule the follow-up appointment.  Your physician recommends that you continue on your current medications as directed. Please refer to the Current Medication list given to you today.  Your physician recommends that you return for lab work in:  TODAY BMET  CBC  A chest x-ray takes a picture of the organs and structures inside the chest, including the heart, lungs, and blood vessels. This test can show several things, including, whether the heart is enlarges; whether fluid is building up in the lungs; and whether pacemaker / defibrillator leads are still in place.

## 2015-02-27 NOTE — Progress Notes (Signed)
Patient ID: [name], female   DOB: [birth year], 64 y.o.   MRN: [id number]   [address], [address] [place], [place]  [address] Phone: [phone] Fax:  [phone]  Date:  02/27/2015   ID:  [name], DOB Jul 10, 1951, MRN :[id number]  PCP:  Pcp Not In System  Cardiologist:  Dr. Jenkins Rouge   Electrophysiologist:  Dr. Thompson Grayer  PV:  [place]letcher Anon CHF:  [place]    History of Present Illness: [name] is a 64 y.o. female f/u for vascular disease, CAD and CHF    She has a hx of CAD, s/p prior Ant MI, s/p prior POBA to LAD, Dx, RCA, CHF due to ICM with high scar burden by MRI (12/09), s/p ICD, HTN, HL, depression, tobacco abuse.   ABI 9/15  .56 on left and .78 on right with bilateral SFA disease  LHC (10/12):  dLAD 40, pCFX 30, OM1 50, pRCA 30.   MRI (12/09):  High scar burden, Inf and Apical AK, EF 24%.  Echo 10/14 EF 15% global HK. RV mild HK  Mod MR. Severe TR. PA 73mmHG   Lexiscan Myoview (11/14): LV Ejection Fraction: 24%. Extensive scar in the inferior wall, apex and anterior wall. Minimal reversibility    CPX 11/14 FVC 2.08 (67%)  FEV1 1.58 (66%)  FEV1/FVC 76%   Resting HR: 84 Peak HR: 142 (91% age predicted max HR) BP rest: 114/60 BP peak: 148/70 (IPE)  Peak VO2: 15 (63% predicted peak VO2) VE/VCO2 slope: 32 OUES: 0.85 Peak RER: 1.33 Ventilatory Threshold: 12.1 (50.8% predicted peak VO2) VE/MVV: 60.1% O2pulse: 6 (75% predicted O2pulse)  She continues to smoke  She understands how bad this is for her vascular disease  Can't cut back to less than 6cigs/day  Walking through moderate claudication No rest pain.  Not a candidate for Pletal due to CHF.  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  No TIAS.  Compliant with meds  CHF stable Saw JA earlier this year and normal AICD function.  Has sinusitis and URI been on flonase and antibiotics per primary   Wt Readings from Last 3 Encounters:  02/27/15 124 lb (56.246 kg)  01/31/15 127 lb 6.4  oz (57.788 kg)  10/24/14 125 lb 4 oz (56.813 kg)     Past Medical History  Diagnosis Date  . Ischemic cardiomyopathy     MRI (12/09):  High scar burden, Inf and Apical AK, EF 24%.;  Echo (10/14): EF 15%, Gr 2 DD, mild to mod MR, mod LAE, RVSF mildly reduced, mod RAE, severe TR, PASP 49-53 (mod pulmonary HTN)  . Chronic systolic heart failure   . HTN (hypertension)   . Depression   . MI (myocardial infarction)   . ICD (implantable cardiac defibrillator) in place   . Asthma   . H/O hiatal hernia   . Arthritis     hands  . CAD (coronary artery disease)     a. s/p prior Ant MI, b. s/p prior POBA to LAD, Dx, RCA,;  c.  LHC (10/12):  dLAD 40, pCFX 30, OM1 50, pRCA 30;  d.  Carlton Adam Myoview (11/14):  High risk study; inferior, anterior, apical defects; minimal reversibility toward the apex; consistent with prior infarct and very mild peri-infarct ischemia, EF 24%, inferior/apical/anterior akinesis  . HLD (hyperlipidemia)   . Tobacco abuse     Current Outpatient Prescriptions  Medication Sig Dispense Refill  . aspirin 81 MG tablet Take 81 mg by  mouth daily.      Marland Kitchen azelastine (ASTELIN) 0.1 % nasal spray Place 1 spray into both nostrils 2 (two) times daily. Use in each nostril as directed    . dexlansoprazole (DEXILANT) 60 MG capsule Take 60 mg by mouth daily.    Marland Kitchen ezetimibe (ZETIA) 10 MG tablet Take 5 mg by mouth every other day.    . fexofenadine (ALLEGRA) 180 MG tablet Take 180 mg by mouth daily as needed for allergies.     . fish oil-omega-3 fatty acids 1000 MG capsule Take 2 g by mouth daily.     . Fluticasone-Salmeterol (ADVAIR) 100-50 MCG/DOSE AEPB Inhale 1 puff into the lungs daily.     . furosemide (LASIX) 20 MG tablet Take 60 mg in the AM and 40 mg in the PM (Patient taking differently: Take 60 mg by mouth in the AM and 40 mg by mouth in the PM) 150 tablet 3  . NON FORMULARY OJC, organic juice cleanse dietary supplement, taking by mouth every other day    . PARoxetine (PAXIL) 30 MG  tablet Take 30 mg by mouth daily.     . polyethylene glycol (MIRALAX / GLYCOLAX) packet Take 17 g by mouth at bedtime.    . ramipril (ALTACE) 10 MG capsule TAKE 1 CAPSULE (10 MG TOTAL) BY MOUTH DAILY. 30 capsule 3  . rosuvastatin (CRESTOR) 5 MG tablet Take 5 mg by mouth 4 (four) times a week.    . spironolactone (ALDACTONE) 25 MG tablet Take 1 tablet (25 mg total) by mouth daily. 30 tablet 4  . vitamin B-12 (CYANOCOBALAMIN) 250 MCG tablet Take 250 mcg by mouth daily.    . [DISCONTINUED] Budesonide (PULMICORT IN) Inhale into the lungs as directed.      . [DISCONTINUED] omeprazole (PRILOSEC) 10 MG capsule Take 10 mg by mouth daily.       No current facility-administered medications for this visit.    Allergies:    Allergies  Allergen Reactions  . Zebeta [Bisoprolol Fumarate]     Pt states she couldn't think and it messed her head up  . Statins Other (See Comments)    Gradually tired with daily Lipitor, made pt feel bad (Pt can take Crestor 5 mg 4 times per week and Zetia 5 mg every other day)    Social History:  The patient  reports that she has been smoking Cigarettes.  She has never used smokeless tobacco. She reports that she does not drink alcohol or use illicit drugs.   ROS:  Please see the history of present illness.   All other systems reviewed and negative.   Filed Vitals:   02/27/15 1442  BP: 102/36  Pulse: 95  Height: 5\' 3"  (1.6 m)  Weight: 124 lb (56.246 kg)    PHYSICAL EXAM:  Well nourished, well developed, in no acute distress HEENT: normal Neck: no JVD Cardiac:  normal S1, S2; brady regular; 1/6 systolic murmur LLSB Lungs:  Markedly decreased breath sounds bilaterally, long exp phase  wheezing  Abd: soft, nontender, no hepatomegaly Ext: no edema, cyanosis or clubbing. L DP non palpable. R DP 1+  Skin: warm and dry Neuro:  CNs 2-12 intact, no focal abnormalities noted  ECG: Marked sinus brady 42. Anterolateral TWI - much more pronounced since  previous  ASSESSMENT AND PLAN: CAD:  stabel no angina continue current meds CHF:  Euvolemic f/u CHF clinic check BMET today Carotid:  F/u duplex July [id number] LICA  Bilateral bruits PVD:  Stop smoking  Suggested she see podiatrist.  ABI worse on left .43  Deferred angio f/u [place] AICD:  Normal function f/u [place] ID:  F/u primary after z pack finished for URI/sinusitis  Lungs sound bad today with COPD  CXR Bruising:  Likely from smoking and ASA  F/u CBC PLT

## 2015-05-15 ENCOUNTER — Telehealth: Payer: Self-pay | Admitting: *Deleted

## 2015-05-15 NOTE — Telephone Encounter (Signed)
LMOM for return call to have pt send remote transmission.

## 2015-05-16 ENCOUNTER — Other Ambulatory Visit (HOSPITAL_COMMUNITY): Payer: Self-pay | Admitting: Internal Medicine

## 2015-05-21 ENCOUNTER — Other Ambulatory Visit (HOSPITAL_COMMUNITY): Payer: Self-pay | Admitting: Internal Medicine

## 2015-06-06 ENCOUNTER — Other Ambulatory Visit (HOSPITAL_COMMUNITY): Payer: Self-pay | Admitting: Internal Medicine

## 2015-06-17 ENCOUNTER — Encounter: Payer: Self-pay | Admitting: *Deleted

## 2015-06-17 ENCOUNTER — Telehealth: Payer: Self-pay | Admitting: *Deleted

## 2015-06-17 NOTE — Telephone Encounter (Signed)
Attempted several phone calls to get pt to send transmission that was due on 05-02-15 that was not scheduled with no success. Sent letter as well.

## 2015-06-20 ENCOUNTER — Ambulatory Visit (INDEPENDENT_AMBULATORY_CARE_PROVIDER_SITE_OTHER): Payer: Medicare Other | Admitting: *Deleted

## 2015-06-20 DIAGNOSIS — Z95810 Presence of automatic (implantable) cardiac defibrillator: Secondary | ICD-10-CM

## 2015-06-21 ENCOUNTER — Telehealth: Payer: Self-pay | Admitting: Internal Medicine

## 2015-06-21 NOTE — Telephone Encounter (Signed)
Spoke w/pt to let know transmission was received.

## 2015-06-21 NOTE — Telephone Encounter (Signed)
New message      Did you get her remote transmission?

## 2015-06-24 NOTE — Progress Notes (Signed)
Remote ICD transmission.   

## 2015-06-26 LAB — CUP PACEART REMOTE DEVICE CHECK
Battery Voltage: 3.07 V
Brady Statistic RV Percent Paced: 0 %
Date Time Interrogation Session: 20160714202216
HighPow Impedance: 88 Ohm
Lead Channel Sensing Intrinsic Amplitude: 16.777 mV
Lead Channel Setting Pacing Amplitude: 2.5 V
Lead Channel Setting Pacing Pulse Width: 0.4 ms
MDC IDC MSMT LEADCHNL RV IMPEDANCE VALUE: 616 Ohm
MDC IDC SET LEADCHNL RV SENSING SENSITIVITY: 0.3 mV
MDC IDC SET ZONE DETECTION INTERVAL: 300 ms
Zone Setting Detection Interval: 400 ms
Zone Setting Detection Interval: 450 ms

## 2015-07-12 ENCOUNTER — Encounter: Payer: Self-pay | Admitting: Cardiology

## 2015-07-16 ENCOUNTER — Encounter: Payer: Self-pay | Admitting: Internal Medicine

## 2015-07-16 ENCOUNTER — Other Ambulatory Visit (INDEPENDENT_AMBULATORY_CARE_PROVIDER_SITE_OTHER): Payer: Medicare Other | Admitting: *Deleted

## 2015-07-16 DIAGNOSIS — E785 Hyperlipidemia, unspecified: Secondary | ICD-10-CM

## 2015-07-16 LAB — HEPATIC FUNCTION PANEL
ALBUMIN: 4.4 g/dL (ref 3.5–5.2)
ALT: 16 U/L (ref 0–35)
AST: 17 U/L (ref 0–37)
Alkaline Phosphatase: 100 U/L (ref 39–117)
BILIRUBIN TOTAL: 0.5 mg/dL (ref 0.2–1.2)
Bilirubin, Direct: 0.1 mg/dL (ref 0.0–0.3)
TOTAL PROTEIN: 7.5 g/dL (ref 6.0–8.3)

## 2015-07-16 LAB — LIPID PANEL
CHOL/HDL RATIO: 3
CHOLESTEROL: 167 mg/dL (ref 0–200)
HDL: 54.8 mg/dL (ref >39.00)
LDL CALC: 93 mg/dL (ref 0–99)
NONHDL: 112.58
TRIGLYCERIDES: 96 mg/dL (ref 0.0–149.0)
VLDL: 19.2 mg/dL (ref 0.0–40.0)

## 2015-07-22 ENCOUNTER — Telehealth: Payer: Self-pay | Admitting: Cardiovascular Disease

## 2015-07-22 NOTE — Telephone Encounter (Signed)
PT AWARE OF LAB RESULTS./CY 

## 2015-07-22 NOTE — Telephone Encounter (Signed)
F/U       Pt returning Christine's phone call.

## 2015-08-11 ENCOUNTER — Other Ambulatory Visit: Payer: Self-pay | Admitting: Cardiovascular Disease

## 2015-08-11 ENCOUNTER — Other Ambulatory Visit (HOSPITAL_COMMUNITY): Payer: Self-pay | Admitting: Internal Medicine

## 2015-08-14 ENCOUNTER — Inpatient Hospital Stay (HOSPITAL_COMMUNITY): Admission: RE | Admit: 2015-08-14 | Payer: Medicare Other | Source: Ambulatory Visit

## 2015-09-10 ENCOUNTER — Encounter (HOSPITAL_COMMUNITY): Payer: Self-pay | Admitting: Internal Medicine

## 2015-09-10 ENCOUNTER — Ambulatory Visit (HOSPITAL_COMMUNITY)
Admission: RE | Admit: 2015-09-10 | Discharge: 2015-09-10 | Disposition: A | Discharge status: Home / Self Care | Payer: Medicare Other | Source: Ambulatory Visit | Attending: Internal Medicine | Admitting: Internal Medicine

## 2015-09-10 VITALS — BP 102/54 | HR 68 | Ht 63.5 in | Wt 127.0 lb

## 2015-09-10 DIAGNOSIS — J45909 Unspecified asthma, uncomplicated: Secondary | ICD-10-CM | POA: Diagnosis not present

## 2015-09-10 DIAGNOSIS — R001 Bradycardia, unspecified: Secondary | ICD-10-CM | POA: Insufficient documentation

## 2015-09-10 DIAGNOSIS — I6523 Occlusion and stenosis of bilateral carotid arteries: Secondary | ICD-10-CM | POA: Insufficient documentation

## 2015-09-10 DIAGNOSIS — I255 Ischemic cardiomyopathy: Secondary | ICD-10-CM | POA: Insufficient documentation

## 2015-09-10 DIAGNOSIS — F17210 Nicotine dependence, cigarettes, uncomplicated: Secondary | ICD-10-CM | POA: Insufficient documentation

## 2015-09-10 DIAGNOSIS — I10 Essential (primary) hypertension: Secondary | ICD-10-CM | POA: Diagnosis not present

## 2015-09-10 DIAGNOSIS — I2510 Atherosclerotic heart disease of native coronary artery without angina pectoris: Secondary | ICD-10-CM | POA: Diagnosis not present

## 2015-09-10 DIAGNOSIS — I739 Peripheral vascular disease, unspecified: Secondary | ICD-10-CM | POA: Insufficient documentation

## 2015-09-10 DIAGNOSIS — J449 Chronic obstructive pulmonary disease, unspecified: Secondary | ICD-10-CM | POA: Diagnosis not present

## 2015-09-10 DIAGNOSIS — I5022 Chronic systolic (congestive) heart failure: Secondary | ICD-10-CM

## 2015-09-10 DIAGNOSIS — I5040 Unspecified combined systolic (congestive) and diastolic (congestive) heart failure: Secondary | ICD-10-CM | POA: Diagnosis not present

## 2015-09-10 DIAGNOSIS — Z95810 Presence of automatic (implantable) cardiac defibrillator: Secondary | ICD-10-CM | POA: Insufficient documentation

## 2015-09-10 DIAGNOSIS — Z79899 Other long term (current) drug therapy: Secondary | ICD-10-CM | POA: Insufficient documentation

## 2015-09-10 DIAGNOSIS — Z7982 Long term (current) use of aspirin: Secondary | ICD-10-CM | POA: Diagnosis not present

## 2015-09-10 DIAGNOSIS — Z720 Tobacco use: Secondary | ICD-10-CM

## 2015-09-10 DIAGNOSIS — I252 Old myocardial infarction: Secondary | ICD-10-CM | POA: Diagnosis not present

## 2015-09-10 DIAGNOSIS — E785 Hyperlipidemia, unspecified: Secondary | ICD-10-CM | POA: Diagnosis not present

## 2015-09-10 LAB — BASIC METABOLIC PANEL
Anion gap: 11 (ref 5–15)
BUN: 16 mg/dL (ref 6–20)
CALCIUM: 9.8 mg/dL (ref 8.9–10.3)
CHLORIDE: 99 mmol/L — AB (ref 101–111)
CO2: 26 mmol/L (ref 22–32)
CREATININE: 1.06 mg/dL — AB (ref 0.44–1.00)
GFR calc Af Amer: >60 mL/min (ref >60)
GFR calc non Af Amer: 53 mL/min — ABNORMAL LOW (ref >60)
Glucose, Bld: 99 mg/dL (ref 65–99)
Potassium: 4.4 mmol/L (ref 3.5–5.1)
SODIUM: 136 mmol/L (ref 135–145)

## 2015-09-10 LAB — BRAIN NATRIURETIC PEPTIDE: B NATRIURETIC PEPTIDE 5: 95 pg/mL (ref 0.0–100.0)

## 2015-09-10 NOTE — Addendum Note (Signed)
Encounter addended by: Carilyn Goodpasture, RN on: 09/10/2015 12:24 PM<BR>     Documentation filed: Visit Diagnoses, Orders, Dx Association

## 2015-09-10 NOTE — Patient Instructions (Signed)
Lab Drawn BMet and BNP  Follow up in 12 months

## 2015-09-10 NOTE — Progress Notes (Signed)
Advanced Heart Failure Note   Patient ID: [name], female   DOB: November 03, 1951, 64 y.o.   MRN: [id number]  Date:  09/10/2015   ID:  [name], DOB Sep 12, 1951, MRN [id number]  PCP:  [name] in [place] Electrophysiologist:  Dr. Thompson Grayer  General Cardiologist: Dr Johnsie Cancel HF: Dr Haroldine Laws   History of Present Illness: [name] is a 64 y.o. female who was referred to the HF Clinic for evaluation for advanced therapies.   She has a hx of CAD, s/p prior Ant MI, s/p prior POBA to LAD, Dx, RCA, CHF due to ICM with high scar burden by MRI (12/09), s/p ICD, HTN, HL, depression, tobacco abuse.   ABI 7/14 R 0.93 L 0.95 MRI (12/09):  High scar burden, Inf and Apical AK, EF 24%.  Echo 10/14 EF 15% global HK. RV mild HK  Mod MR. Severe TR. PA 55mmHG   Lexiscan Myoview (11/14): LV Ejection Fraction: 24%. Extensive scar in the inferior wall, apex and anterior wall. Minimal reversibility Carotid Doppler : August 2015 showed 60-79% bilateral ICA stenosis. No previous CVA. Cath 07/26/14 Left main: Calcified 40% mid to distal stenosis LAD: Long vessel wrapping the apex 20-30% plaque in midsection. Distal vessel is small.  LCX: Large OM-1 and large OM-2. 50% lesion in proximal OM-2 RCA: Dominant vessel. Calcified, eccentric 50-60% lesion in midsection . LV-gram done in the RAO projection: Ejection fraction = 25% Severe global HK of mid to distal LV. No MR.    CPX 11/14 FVC 2.08 (67%)  FEV1 1.58 (66%)  FEV1/FVC 76%  Resting HR: 84 Peak HR: 142 (91% age predicted max HR) BP rest: 114/60 BP peak: 148/70 (IPE) Peak VO2: 15.0 (63% predicted peak VO2) VE/VCO2 slope: 32 OUES: 0.85 Peak RER: 1.33 Ventilatory Threshold: 12.1 (50.8% predicted peak VO2) VE/MVV: 60.1% O2pulse: 6 (75% predicted O2pulse)  She presents today for follow up. At last visit was switched to bisoprolol from Coreg due to fatigue. Had to stop bisoprolol for fatigue as well. Feeling good overall.  Denies peripheral edema but does have occasional abdominal distention. Says she feels like crying most days.  Appetite comes and goes and doesn't enjoy her previous hobbies. Feels like paxil is not helping her depression.  No dyspnea on flat ground, but some DOE with stairs and inclines. No chest pain.  No orthopnea or PND.  Claudication still bothering her. Still smoking 6-7 cigs/day.   ICD interrogated thoracic impedence above threshold, fluid status stable.  Pt activity 4-5 hours daily. No VT/AF  Labs (10/12):  K 3.2, creatinine 0.9, Hgb 13.5 Labs (9/14):    K 3.4, creatinine 1.0, pro BNP 1560, Hgb 11.8, HDL 25, LDL 60, ALT 29 Labs (10/14):  K 3.3=> 4.2, creatinine 1.2, BNP 1175 Labs (10/02/13) K 3.2 creatinine 0.9 Labs (12/13/13): LDL 134, AST 24, ALT 24, TC 221, TG 104 Labs (8/15): K 5.1, creatinine 1.1  Labs (11/15): LDL 79, HDL 67 Labs (3/16): K 3.9, creatinine 1.13  Wt Readings from Last 3 Encounters:  09/10/15 127 lb (57.607 kg)  02/27/15 124 lb (56.246 kg)  01/31/15 127 lb 6.4 oz (57.788 kg)     Past Medical History  Diagnosis Date  . Ischemic cardiomyopathy     MRI (12/09):  High scar burden, Inf and Apical AK, EF 24%.;  Echo (10/14): EF 15%, Gr 2 DD, mild to mod MR, mod LAE, RVSF mildly reduced, mod RAE, severe TR, PASP 49-53 (mod pulmonary HTN)  . Chronic  systolic heart failure ([place])   . HTN (hypertension)   . Depression   . MI (myocardial infarction) ([place])   . ICD (implantable cardiac defibrillator) in place   . Asthma   . H/O hiatal hernia   . Arthritis     hands  . CAD (coronary artery disease)     a. s/p prior Ant MI, b. s/p prior POBA to LAD, Dx, RCA,;  c.  LHC (10/12):  dLAD 40, pCFX 30, OM1 50, pRCA 30;  d.  Carlton Adam Myoview (11/14):  High risk study; inferior, anterior, apical defects; minimal reversibility toward the apex; consistent with prior infarct and very mild peri-infarct ischemia, EF 24%, inferior/apical/anterior akinesis  . HLD (hyperlipidemia)   .  Tobacco abuse     Current Outpatient Prescriptions  Medication Sig Dispense Refill  . aspirin 81 MG tablet Take 81 mg by mouth daily.      Marland Kitchen azelastine (ASTELIN) 0.1 % nasal spray Place 1 spray into both nostrils 2 (two) times daily. Use in each nostril as directed    . dexlansoprazole (DEXILANT) 60 MG capsule Take 60 mg by mouth daily.    Marland Kitchen ezetimibe (ZETIA) 10 MG tablet Take 5 mg by mouth 2 (two) times a week.    . fexofenadine (ALLEGRA) 180 MG tablet Take 180 mg by mouth daily as needed for allergies.     . fish oil-omega-3 fatty acids 1000 MG capsule Take 2 g by mouth daily.     . Fluticasone-Salmeterol (ADVAIR) 100-50 MCG/DOSE AEPB Inhale 1 puff into the lungs daily.     . furosemide (LASIX) 20 MG tablet TAKE 3 TABLETS BY MOUTH IN THE MORNING AND TAKE 2 TABLETS IN THE EVENING 150 tablet 3  . NON FORMULARY OJC, organic juice cleanse dietary supplement, taking by mouth every other day    . PARoxetine (PAXIL) 30 MG tablet Take 30 mg by mouth daily.     . polyethylene glycol (MIRALAX / GLYCOLAX) packet Take 17 g by mouth at bedtime.    . ramipril (ALTACE) 10 MG capsule TAKE ONE CAPSULE BY MOUTH EVERY DAY 30 capsule 0  . rosuvastatin (CRESTOR) 5 MG tablet Take 5 mg by mouth 4 (four) times a week.    . spironolactone (ALDACTONE) 25 MG tablet TAKE 1 TABLET BY MOUTH EVERY DAY 30 tablet 4  . vitamin B-12 (CYANOCOBALAMIN) 250 MCG tablet Take 250 mcg by mouth daily.    . [DISCONTINUED] Budesonide (PULMICORT IN) Inhale into the lungs as directed.      . [DISCONTINUED] omeprazole (PRILOSEC) 10 MG capsule Take 10 mg by mouth daily.       No current facility-administered medications for this encounter.    Allergies:    Allergies  Allergen Reactions  . Zebeta [Bisoprolol Fumarate]     Pt states she couldn't think and it messed her head up  . Statins Other (See Comments)    Gradually tired with daily Lipitor, made pt feel bad (Pt can take Crestor 5 mg 4 times per week and Zetia 5 mg every other  day)    Social History:  The patient  reports that she has been smoking Cigarettes.  She has never used smokeless tobacco. She reports that she does not drink alcohol or use illicit drugs.   ROS:  Please see the history of present illness.   All other systems reviewed and negative.   Filed Vitals:   09/10/15 1114  BP: 102/54  Pulse: 68  Height: 5' 3.5" (1.613 m)  Weight: 127 lb (57.607 kg)  SpO2: 97%    PHYSICAL EXAM:  Well nourished, well developed, in no acute distress HEENT: normal Neck: no JVD. + bilateral L>R carotid bruit Cardiac:  normal S1, S2; brady regular; 1/6 systolic murmur LLSB Lungs:  Decreased bilateral breath sounds, no wheezing Abd: soft, nontender, non-distended,  no hepatomegaly Ext: no edema, cyanosis or clubbing. Diminished DP pulses. R>L  Skin: warm and dry Neuro:  CNs 2-12 intact, no focal abnormalities noted  ASSESSMENT AND PLAN:  1. CAD s/p previous anterior MI:  Cath 8/15 nonobstructive CAD.  No chest pain.  Continue ASA and statin.  2.  Chronic Systolic CHF:  EF 123456 ([id number]) due to ischemic cardiomyopathy with mild RV dysfunction s/p Medtronic ICD.  - Optivol and exam show stable volume status. - Continue ramipril and spironolactone at current doses.  - Does not tolerate beta blockers. - BMET/BNP today.  3. COPD with ongoing tobacco abuse: strongly encouraged her to quit smoking.  4. PAD with Intermittent claudication: Continue efforts to walk through the pain for at least short distances.  5. Bilateral carotid stenosis 6. H/o symptomatic bradycardia: HR 68 today, off BBs.  7. HTN: BP not elevated.  8. Hyperlipidemia with intolerance of multiple cholesterol meds: Tolerating Crestor 5 mg four times/week and Zetia two times a week. Lipids stable in 07/2015  Satira Mccallum Tillery PA-C 09/10/2015  Patient seen and examined with Oda Kilts, PA-C. We discussed all aspects of the encounter. I agree with the assessment and plan as stated above.    Doing well. NYHA II-III. Volume status looks good. On full dose ACE-I and spiro. Unable to tolerate b-blockers of any kind. BP too low to switch to [employer]. ICD interrogation looks good. No VT. Volume status good. Activity level great. Not candidate for advanced therapies due to COPD. Encouraged her to stop smoking if at all possible. Can f/u with Dr. Johnsie Cancel. We will see back in 1 year or sooner, if clinically needed.   Bensimhon, Daniel,MD 12:12 PM

## 2015-09-10 NOTE — Addendum Note (Signed)
Encounter addended by: Carilyn Goodpasture, RN on: 09/10/2015 12:28 PM<BR>     Documentation filed: Patient Instructions Section

## 2015-09-12 ENCOUNTER — Other Ambulatory Visit: Payer: Self-pay | Admitting: Cardiovascular Disease

## 2015-09-23 ENCOUNTER — Ambulatory Visit (INDEPENDENT_AMBULATORY_CARE_PROVIDER_SITE_OTHER): Payer: Medicare Other | Admitting: *Deleted

## 2015-09-23 ENCOUNTER — Telehealth: Payer: Self-pay | Admitting: Cardiology

## 2015-09-23 DIAGNOSIS — Z95810 Presence of automatic (implantable) cardiac defibrillator: Secondary | ICD-10-CM

## 2015-09-23 NOTE — Progress Notes (Signed)
Remote ICD transmission.   

## 2015-09-23 NOTE — Telephone Encounter (Signed)
LMOVM reminding pt to send remote transmission.   

## 2015-09-24 ENCOUNTER — Encounter: Payer: Self-pay | Admitting: Cardiology

## 2015-09-24 LAB — CUP PACEART REMOTE DEVICE CHECK
Brady Statistic RV Percent Paced: 0 %
HIGH POWER IMPEDANCE MEASURED VALUE: 84 Ohm
Implantable Lead Location: 753860
Lead Channel Impedance Value: 624 Ohm
Lead Channel Setting Pacing Amplitude: 2.5 V
Lead Channel Setting Pacing Pulse Width: 0.4 ms
MDC IDC LEAD IMPLANT DT: 20100212
MDC IDC LEAD MODEL: 6935
MDC IDC MSMT BATTERY VOLTAGE: 3.06 V
MDC IDC MSMT LEADCHNL RV SENSING INTR AMPL: 18.093 mV
MDC IDC SESS DTM: 20161017183828
MDC IDC SET LEADCHNL RV SENSING SENSITIVITY: 0.3 mV

## 2015-10-10 ENCOUNTER — Other Ambulatory Visit: Payer: Self-pay | Admitting: Cardiovascular Disease

## 2015-10-10 ENCOUNTER — Other Ambulatory Visit: Payer: Self-pay | Admitting: Internal Medicine

## 2015-10-14 ENCOUNTER — Encounter: Payer: Self-pay | Admitting: Internal Medicine

## 2015-10-21 ENCOUNTER — Other Ambulatory Visit (HOSPITAL_COMMUNITY): Payer: Self-pay | Admitting: *Deleted

## 2015-10-21 ENCOUNTER — Other Ambulatory Visit: Payer: Self-pay | Admitting: Pharmacist

## 2015-10-21 ENCOUNTER — Telehealth: Payer: Self-pay | Admitting: Internal Medicine

## 2015-10-21 MED ORDER — EZETIMIBE 10 MG PO TABS: 5.0000 mg | ORAL_TABLET | ORAL | Status: DC

## 2015-10-21 NOTE — Telephone Encounter (Signed)
Pt requesting a refill on zetia 10 mg tablet. Please advise

## 2015-10-25 NOTE — Telephone Encounter (Signed)
Pt's medication has already been refilled and sent to the pt's pharmacy. Confirmation received.

## 2015-11-08 ENCOUNTER — Other Ambulatory Visit: Payer: Self-pay | Admitting: Internal Medicine

## 2015-11-21 ENCOUNTER — Other Ambulatory Visit: Payer: Self-pay | Admitting: Internal Medicine

## 2015-12-29 ENCOUNTER — Other Ambulatory Visit: Payer: Self-pay | Admitting: Internal Medicine

## 2016-01-08 ENCOUNTER — Encounter: Payer: Self-pay | Admitting: Internal Medicine

## 2016-01-20 ENCOUNTER — Encounter: Payer: Self-pay | Admitting: Internal Medicine

## 2016-01-20 ENCOUNTER — Ambulatory Visit (INDEPENDENT_AMBULATORY_CARE_PROVIDER_SITE_OTHER): Payer: Medicare Other | Admitting: Internal Medicine

## 2016-01-20 VITALS — BP 102/60 | HR 78 | Ht 63.0 in | Wt 128.4 lb

## 2016-01-20 DIAGNOSIS — Z720 Tobacco use: Secondary | ICD-10-CM

## 2016-01-20 DIAGNOSIS — I255 Ischemic cardiomyopathy: Secondary | ICD-10-CM | POA: Diagnosis not present

## 2016-01-20 DIAGNOSIS — I5022 Chronic systolic (congestive) heart failure: Secondary | ICD-10-CM

## 2016-01-20 LAB — CUP PACEART INCLINIC DEVICE CHECK
Battery Voltage: 3.06 V
HighPow Impedance: 82 Ohm
Implantable Lead Implant Date: 20100212
Implantable Lead Location: 753860
Implantable Lead Model: 6935
Lead Channel Pacing Threshold Pulse Width: 0.4 ms
MDC IDC MSMT LEADCHNL RV IMPEDANCE VALUE: 608 Ohm
MDC IDC MSMT LEADCHNL RV PACING THRESHOLD AMPLITUDE: 0.75 V
MDC IDC MSMT LEADCHNL RV SENSING INTR AMPL: 17.106 mV
MDC IDC SESS DTM: 20170213113628
MDC IDC SET LEADCHNL RV PACING AMPLITUDE: 2.5 V
MDC IDC SET LEADCHNL RV PACING PULSEWIDTH: 0.4 ms
MDC IDC SET LEADCHNL RV SENSING SENSITIVITY: 0.3 mV
MDC IDC STAT BRADY RV PERCENT PACED: 0 %

## 2016-01-20 NOTE — Progress Notes (Signed)
Electrophysiology Office Note   Date:  01/20/2016   ID:  [name], DOB 11-Mar-1951, MRN [id number]  PCP:  Pcp Not In System  Cardiologist:  Dr Johnsie Cancel Dr Haroldine Laws follows for CHF Primary Electrophysiologist: Greely Atiyeh  Chief Complaint  Patient presents with  . Chronic systolic heart failure     History of Present Illness: [name] is a 65 y.o. female who presents today for routine electrophysiology follow up. She has been followed in the CHF clinic as well as by Dr Johnsie Cancel and feels that her CHF is stable.  She remains active.    Today, she denies symptoms of palpitations, chest pain, shortness of breath, orthopnea, PND, lower extremity edema, claudication, dizziness, presyncope, syncope, bleeding, or neurologic sequela. The patient is tolerating medications without difficulties and is otherwise without complaint today.    Past Medical History  Diagnosis Date  . Ischemic cardiomyopathy     MRI (12/09):  High scar burden, Inf and Apical AK, EF 24%.;  Echo (10/14): EF 15%, Gr 2 DD, mild to mod MR, mod LAE, RVSF mildly reduced, mod RAE, severe TR, PASP 49-53 (mod pulmonary HTN)  . Chronic systolic heart failure ([place])   . HTN (hypertension)   . Depression   . MI (myocardial infarction) ([place])   . ICD (implantable cardiac defibrillator) in place   . Asthma   . H/O hiatal hernia   . Arthritis     hands  . CAD (coronary artery disease)     a. s/p prior Ant MI, b. s/p prior POBA to LAD, Dx, RCA,;  c.  LHC (10/12):  dLAD 40, pCFX 30, OM1 50, pRCA 30;  d.  Carlton Adam Myoview (11/14):  High risk study; inferior, anterior, apical defects; minimal reversibility toward the apex; consistent with prior infarct and very mild peri-infarct ischemia, EF 24%, inferior/apical/anterior akinesis  . HLD (hyperlipidemia)   . Tobacco abuse    Past Surgical History  Procedure Laterality Date  . Cardiac defibrillator placement  01/18/09    MDT implanted by JA  . Insert / replace / remove pacemaker       "pacemaker is not on"  . Coronary angioplasty    . Cardiac stents    . Colonoscopy  03/18/2012    Procedure: COLONOSCOPY;  Surgeon: Beryle Beams, MD;  Location: [facility];  Service: Endoscopy;  Laterality: N/A;  . Esophagogastroduodenoscopy N/A 03/10/2013    Procedure: ESOPHAGOGASTRODUODENOSCOPY (EGD);  Surgeon: Beryle Beams, MD;  Location: [facility];  Service: Endoscopy;  Laterality: N/A;  . Left heart catheterization with coronary angiogram N/A 07/26/2014    Procedure: LEFT HEART CATHETERIZATION WITH CORONARY ANGIOGRAM;  Surgeon: Jolaine Artist, MD;  Location: [facility] CATH LAB;  Service: Cardiovascular;  Laterality: N/A;     Current Outpatient Prescriptions  Medication Sig Dispense Refill  . aspirin 81 MG tablet Take 81 mg by mouth daily.      Marland Kitchen azelastine (ASTELIN) 0.1 % nasal spray Place 1 spray into both nostrils 2 (two) times daily. Use in each nostril as directed    . dexlansoprazole (DEXILANT) 60 MG capsule Take 60 mg by mouth daily.    Marland Kitchen ezetimibe (ZETIA) 10 MG tablet Take 0.5 tablets (5 mg total) by mouth 2 (two) times a week. 5 tablet 11  . fexofenadine (ALLEGRA) 180 MG tablet Take 180 mg by mouth daily as needed for allergies.     . fish oil-omega-3 fatty acids 1000 MG capsule Take 2 g by mouth daily.     Marland Kitchen  Fluticasone-Salmeterol (ADVAIR) 100-50 MCG/DOSE AEPB Inhale 1 puff into the lungs daily.     . furosemide (LASIX) 20 MG tablet TAKE 3 TABLETS BY MOUTH IN THE MORNING AND TAKE 2 TABLETS IN THE EVENING 150 tablet 3  . NON FORMULARY OJC, organic juice cleanse dietary supplement, taking by mouth every other day    . PARoxetine (PAXIL) 30 MG tablet Take 30 mg by mouth daily.     . polyethylene glycol (MIRALAX / GLYCOLAX) packet Take 17 g by mouth at bedtime.    . ramipril (ALTACE) 10 MG capsule Take 10 mg by mouth daily.    . rosuvastatin (CRESTOR) 5 MG tablet Take 5 mg by mouth 4 (four) times a week.    . spironolactone (ALDACTONE) 25 MG tablet TAKE 1 TABLET BY MOUTH  EVERY DAY 30 tablet 4  . vitamin B-12 (CYANOCOBALAMIN) 250 MCG tablet Take 250 mcg by mouth daily.    . [DISCONTINUED] Budesonide (PULMICORT IN) Inhale into the lungs as directed.      . [DISCONTINUED] omeprazole (PRILOSEC) 10 MG capsule Take 10 mg by mouth daily.       No current facility-administered medications for this visit.    Allergies:   Zebeta and Statins   Social History:  The patient  reports that she has been smoking Cigarettes.  She has never used smokeless tobacco. She reports that she does not drink alcohol or use illicit drugs.   ROS:  Please see the history of present illness.   All other systems are reviewed and negative.    PHYSICAL EXAM: VS:  BP 102/60 mmHg  Pulse 78  Ht 5\' 3"  (1.6 m)  Wt 128 lb 6.4 oz (58.242 kg)  BMI 22.75 kg/m2 , BMI Body mass index is 22.75 kg/(m^2). GEN: Well nourished, well developed, in no acute distress HEENT: normal Neck: no JVD, carotid bruits, or masses Cardiac: RRR; no murmurs, rubs, or gallops,no edema  Respiratory:  clear to auscultation bilaterally, normal work of breathing GI: soft, nontender, nondistended, + BS MS: no deformity or atrophy Skin: warm and dry, device pocket is well healed Neuro:  Strength and sensation are intact Psych: euthymic mood, full affect  Device interrogation is reviewed today in detail.  See PaceArt for details.   Recent Labs: 02/27/2015: Hemoglobin 12.1; Platelets 193.0 07/16/2015: ALT 16 09/10/2015: B Natriuretic Peptide 95.0; BUN 16; Creatinine, Ser 1.06*; Potassium 4.4; Sodium 136    Lipid Panel     Component Value Date/Time   CHOL 167 07/16/2015 0955   TRIG 96.0 07/16/2015 0955   HDL 54.80 07/16/2015 0955   CHOLHDL 3 07/16/2015 0955   VLDL 19.2 07/16/2015 0955   LDLCALC 93 07/16/2015 0955   LDLDIRECT 134.7 12/13/2013 1043     Wt Readings from Last 3 Encounters:  01/20/16 128 lb 6.4 oz (58.242 kg)  09/10/15 127 lb (57.607 kg)  02/27/15 124 lb (56.246 kg)    Other studies  Reviewed: Additional studies/ records that were reviewed today include: CHF clinic note   ASSESSMENT AND PLAN:  1.  Ischemic cm/ CAD/ chronic systolic dysfunction Normal ICD function See Pace Art report No changes today No ischemic symptoms, CHF stable No changes today  2. Tobacco Cessation advised She it not ready to quit  [facility], enroll in ICM clinic  Return to see EP NP in 1 year  Current medicines are reviewed at length with the patient today.   The patient does not have concerns regarding her medicines.  The following changes were made  today:  none  Signed, Thompson Grayer, MD  01/20/2016 11:40 AM     [facility] [address] [place] [address] [phone] (office) [phone] (fax)

## 2016-01-20 NOTE — Patient Instructions (Signed)
Medication Instructions:  Your physician recommends that you continue on your current medications as directed. Please refer to the Current Medication list given to you today.   Labwork: None ordered   Testing/Procedures: None ordered   Follow-Up: Your physician wants you to follow-up in: 12 months with [name], NP You will receive a reminder letter in the mail two months in advance. If you don't receive a letter, please call our office to schedule the follow-up appointment.  Remote monitoring is used to monitor your  ICD from home. This monitoring reduces the number of office visits required to check your device to one time per year. It allows [place] to keep an eye on the functioning of your device to ensure it is working properly. You are scheduled for a device check from home on 04/20/16. You may send your transmission at any time that day. If you have a wireless device, the transmission will be sent automatically. After your physician reviews your transmission, you will receive a postcard with your next transmission date.  ICM clinic with Sharman Cheek, RN---she will call you with time    Any Other Special Instructions Will Be Listed Below (If Applicable).     If you need a refill on your cardiac medications before your next appointment, please call your pharmacy.

## 2016-01-20 NOTE — Progress Notes (Signed)
Patient referred to [facility] clinic by [name] NP/Dr Allred.  Met patient in office and provided ICM intro. She agreed to monthly follow up.  1st ICM remote transmission scheduled for 02/25/2016.  Encouraged her to call if she has any fluid symptoms.  Patient letter sent with transmission date.

## 2016-01-29 ENCOUNTER — Telehealth: Payer: Self-pay | Admitting: Cardiovascular Disease

## 2016-01-29 NOTE — Telephone Encounter (Signed)
Please call,she needs to ask some clinical questions. Pt is on Ezetimibe. I was not sure if this went to you the nurse,or refill pool.

## 2016-01-29 NOTE — Telephone Encounter (Signed)
KYLA FANTAUZZI  01/29/2016  Telephone  MRN:  [id number]   Description: 65 year old female  Provider: Josue Hector, MD  Department: [facility]       Reason for Call     Advice Only         Call Documentation      Glyn Ade at 01/29/2016 12:12 PM     Status: Signed       Expand All Collapse All   Please call,she needs to ask some clinical questions. Pt is on Ezetimibe. I was not sure if this went to you the nurse,or refill pool.             Encounter MyChart Messages     No messages in this encounter     Routing History     Priority Sent On From To Message Type     01/29/2016 2:36 PM Michaelyn Barter, RN [facility] Refill Patient Calls     01/29/2016 12:15 PM Valeria Batman, RN Patient Calls      Created by     Glyn Ade on 01/29/2016 12:10 PM     Visit Pharmacy     [id number] - [place], [place] - [address] [place] [address] [place] [address]     Contacts       Type Contact Phone    01/29/2016 12:10 PM Phone (Incoming) [facility] ([facility]) [phone]      LVM for pt to call us back & also called Verdis Frederickson @ [facility] as she was listed as the person calling, to see what they need, [facility]'t seem to know what i was calling for, Verdis Frederickson did not find any notes that they called [place], will wait for pt to call us back.

## 2016-01-29 NOTE — Telephone Encounter (Signed)
Pt called stating that [employer] will not pay for generic Zetia & that [employer] was suppose to be calling us. As of yet we have not received a call, will send to Howie Ill for advisement.

## 2016-01-29 NOTE — Telephone Encounter (Signed)
Patient needs prior auth for generic Zetia.

## 2016-02-04 ENCOUNTER — Telehealth (HOSPITAL_COMMUNITY): Payer: Self-pay | Admitting: Pharmacist

## 2016-02-04 NOTE — Telephone Encounter (Signed)
Ezetimibe PA approved by [employer] through 02/02/17.  Ruta Hinds. Velva Harman, PharmD, BCPS, CPP Clinical Pharmacist Pager: [phone] Phone: [phone] 02/04/2016 3:02 PM

## 2016-02-06 DIAGNOSIS — E785 Hyperlipidemia, unspecified: Secondary | ICD-10-CM | POA: Insufficient documentation

## 2016-02-06 DIAGNOSIS — K219 Gastro-esophageal reflux disease without esophagitis: Secondary | ICD-10-CM | POA: Insufficient documentation

## 2016-02-06 DIAGNOSIS — I2510 Atherosclerotic heart disease of native coronary artery without angina pectoris: Secondary | ICD-10-CM | POA: Insufficient documentation

## 2016-02-06 DIAGNOSIS — J349 Unspecified disorder of nose and nasal sinuses: Secondary | ICD-10-CM | POA: Insufficient documentation

## 2016-02-06 DIAGNOSIS — K449 Diaphragmatic hernia without obstruction or gangrene: Secondary | ICD-10-CM | POA: Insufficient documentation

## 2016-02-06 DIAGNOSIS — J45909 Unspecified asthma, uncomplicated: Secondary | ICD-10-CM | POA: Insufficient documentation

## 2016-02-06 DIAGNOSIS — R0683 Snoring: Secondary | ICD-10-CM | POA: Insufficient documentation

## 2016-02-06 DIAGNOSIS — I429 Cardiomyopathy, unspecified: Secondary | ICD-10-CM | POA: Insufficient documentation

## 2016-02-06 DIAGNOSIS — J309 Allergic rhinitis, unspecified: Secondary | ICD-10-CM | POA: Insufficient documentation

## 2016-02-07 ENCOUNTER — Telehealth: Payer: Self-pay | Admitting: Cardiovascular Disease

## 2016-02-07 NOTE — Telephone Encounter (Signed)
Apparently, this was already done through Dr. Damaris Schooner office. It was approved for a year.

## 2016-02-07 NOTE — Telephone Encounter (Signed)
Returning your call. °

## 2016-02-07 NOTE — Telephone Encounter (Signed)
Returned call to patient. Ezetimibe already approved.

## 2016-02-11 ENCOUNTER — Other Ambulatory Visit (HOSPITAL_COMMUNITY): Payer: Self-pay | Admitting: Internal Medicine

## 2016-02-24 ENCOUNTER — Ambulatory Visit (INDEPENDENT_AMBULATORY_CARE_PROVIDER_SITE_OTHER): Payer: Medicare Other

## 2016-02-24 ENCOUNTER — Telehealth: Payer: Self-pay

## 2016-02-24 DIAGNOSIS — I5022 Chronic systolic (congestive) heart failure: Secondary | ICD-10-CM | POA: Diagnosis not present

## 2016-02-24 DIAGNOSIS — Z95810 Presence of automatic (implantable) cardiac defibrillator: Secondary | ICD-10-CM

## 2016-02-24 NOTE — Progress Notes (Signed)
EPIC Encounter for ICM Monitoring  Patient Name: [name] is a 65 y.o. female Date: 02/24/2016 Primary Care Physican: Pcp Not In System Primary Cardiologist: Nishan/McLean Electrophysiologist: Allred Dry Weight: 124.5 lb   In the past month, have you:  1. Gained more than 2 pounds in a day or more than 5 pounds in a week? Yes, 2 lbs within last 4 days  2. Had changes in your medications (with verification of current medications)? no  3. Had more shortness of breath than is usual for you? no  4. Limited your activity because of shortness of breath? no  5. Not been able to sleep because of shortness of breath? no  6. Had increased swelling in your feet or ankles? Yes, earlier in the week she had swelling in her legs and today she feels it more in her abdomen.   7. Had symptoms of dehydration (dizziness, dry mouth, increased thirst, decreased urine output) no  8. Had changes in sodium restriction? no  9. Been compliant with medication? Yes   ICM trend: 3 month view for 02/24/2016   ICM trend: 1 year view for 02/24/2016  Follow-up plan: ICM clinic phone appointment on 02/28/2016.  Thoracic impedance below reference line from 02/17/2016 to 02/24/2016 suggesting fluid accumulation.  Patient has symptoms of slight increase in SOB, abdominal swelling and leg swelling in last week.  She stated she did have some tortilla chips and advised to limit sodium intake to 2000 mg daily and fluid intake to 64 oz daily.          Advised to increase Furosemide to 80 mg in am and 60 mg in pm x 3 days but if symptoms resolve prior to 3rd day then stop extra dosages.  Advised after 3 days return to normally prescribed Furosemide dosage of 60 mg every am        and 40 mg every pm.  She reported she has increased her medication in the past when she has symptoms.  Repeat transmission on 02/28/2016.  09/10/2015 Creatinine 1.06, BUN 16, Potassium 4.4 02/27/2015 Creatinine 1.3,. BUN 18, Potassium  3.9  Copy of note sent to patient's primary care physician, primary cardiologist, and device following physician.  Rosalene Billings, RN, CCM 02/24/2016 2:24 PM

## 2016-02-24 NOTE — Telephone Encounter (Signed)
Spoke with patient.  No ICM remote transmission sent.  Advised to send a manual transmission and she stated she will send today.

## 2016-02-28 ENCOUNTER — Ambulatory Visit (INDEPENDENT_AMBULATORY_CARE_PROVIDER_SITE_OTHER): Payer: Medicare Other

## 2016-02-28 DIAGNOSIS — Z95810 Presence of automatic (implantable) cardiac defibrillator: Secondary | ICD-10-CM

## 2016-02-28 DIAGNOSIS — I5022 Chronic systolic (congestive) heart failure: Secondary | ICD-10-CM

## 2016-02-28 NOTE — Progress Notes (Signed)
EPIC Encounter for ICM Monitoring  Patient Name: [name] is a 65 y.o. female Date: 02/28/2016 Primary Care Physican: Pcp Not In System Primary Cardiologist: Nishan/Bensimhon Electrophysiologist: Allred Dry Weight: 123.5 lbs   In the past month, have you:  1. Gained more than 2 pounds in a day or more than 5 pounds in a week? Lost 1 pound of the 2 pounds gained   2. Had changes in your medications (with verification of current medications)? no  3. Had more shortness of breath than is usual for you? no  4. Limited your activity because of shortness of breath? no  5. Not been able to sleep because of shortness of breath? no  6. Had increased swelling in your feet or ankles? no  7. Had symptoms of dehydration (dizziness, dry mouth, increased thirst, decreased urine output) no  8. Had changes in sodium restriction? no  9. Been compliant with medication? Yes   ICM trend: 3 month view for 02/28/2016   ICM trend: 1 year view for 02/28/2016   Follow-up plan: ICM clinic phone appointment on 03/16/2016.  Thoracic impedance continues below reference line since last transmission on 02/24/2016 suggesting she still has fluid accumulation.  Patient reported she has lost 1 lb of the 2 she had gained.  No edema in lower extremities or belly.   Thoracic impedance did improve after increase in Furosemide x 3 days from 02/24/2016 to 02/27/2016.  Patient has returned to prescribed dosage of Furosemide.   Education given to limit sodium intake to < 2000 mg and fluid intake to 64 oz daily.  Encouraged to call for any fluid symptoms.  No changes today.    Advised will send to Dr Johnsie Cancel, Dr Haroldine Laws and Dr Rayann Heman for review and will call back if any recommendation.  Will repeat transmission 03/16/2016.   Copy of note sent to patient's primary care physician, primary cardiologist, and device following physician.  Rosalene Billings, RN, CCM 02/28/2016 1:29 PM

## 2016-03-14 ENCOUNTER — Other Ambulatory Visit (HOSPITAL_COMMUNITY): Payer: Self-pay | Admitting: Internal Medicine

## 2016-03-16 ENCOUNTER — Telehealth: Payer: Self-pay

## 2016-03-16 ENCOUNTER — Ambulatory Visit (INDEPENDENT_AMBULATORY_CARE_PROVIDER_SITE_OTHER): Payer: Medicare Other

## 2016-03-16 DIAGNOSIS — I5022 Chronic systolic (congestive) heart failure: Secondary | ICD-10-CM

## 2016-03-16 DIAGNOSIS — Z95810 Presence of automatic (implantable) cardiac defibrillator: Secondary | ICD-10-CM

## 2016-03-16 NOTE — Telephone Encounter (Signed)
Remote ICM transmission received.  Attempted patient call and left message for return call.   

## 2016-03-16 NOTE — Progress Notes (Signed)
EPIC Encounter for ICM Monitoring  Patient Name: [name] is a 65 y.o. female Date: 03/16/2016 Primary Care Physican: Pcp Not In System Primary Cardiologist: Nishan/Bensimhon Electrophysiologist: Allred Dry Weight: 123 lb   In the past month, have you:  1. Gained more than 2 pounds in a day or more than 5 pounds in a week? no  2. Had changes in your medications (with verification of current medications)? no  3. Had more shortness of breath than is usual for you? no  4. Limited your activity because of shortness of breath? no  5. Not been able to sleep because of shortness of breath? no  6. Had increased swelling in your feet or ankles? no  7. Had symptoms of dehydration (dizziness, dry mouth, increased thirst, decreased urine output) no  8. Had changes in sodium restriction? no  9. Been compliant with medication? Yes   ICM trend: 3 month view for 03/16/2016   ICM trend: 1 year view for 03/16/2016   Follow-up plan: ICM clinic phone appointment on 04/20/2016.  Attempted call to patient and left message for return call.  Patient returned call.   Since last ICM transmission on 02/28/2016, thoracic impedance below reference line from 02/28/2016 to 03/16/2016 suggesting fluid accumulation and trending back to reference line 03/16/2016.  Furosemide was increased x 3 days on 02/24/2016 x 3 days.   She denied any fluid symptoms and stated she is feeling fine.     No changes today since thoracic impedance is close to reference line.  Suggested she call for any fluid symptoms.  Advised it has been a year since her last appointment with Dr Johnsie Cancel.     Copy of note sent to patient's primary care physician, primary cardiologist, and device following physician.  Rosalene Billings, RN, CCM 03/16/2016 1:59 PM

## 2016-03-31 DIAGNOSIS — N183 Chronic kidney disease, stage 3 (moderate): Secondary | ICD-10-CM

## 2016-03-31 DIAGNOSIS — N179 Acute kidney failure, unspecified: Secondary | ICD-10-CM | POA: Insufficient documentation

## 2016-04-08 DIAGNOSIS — M654 Radial styloid tenosynovitis [de Quervain]: Secondary | ICD-10-CM | POA: Insufficient documentation

## 2016-04-08 DIAGNOSIS — F411 Generalized anxiety disorder: Secondary | ICD-10-CM | POA: Insufficient documentation

## 2016-04-20 ENCOUNTER — Telehealth: Payer: Self-pay | Admitting: Cardiology

## 2016-04-20 ENCOUNTER — Ambulatory Visit (INDEPENDENT_AMBULATORY_CARE_PROVIDER_SITE_OTHER): Payer: Medicare Other | Admitting: *Deleted

## 2016-04-20 ENCOUNTER — Other Ambulatory Visit: Payer: Self-pay | Admitting: Internal Medicine

## 2016-04-20 DIAGNOSIS — I5022 Chronic systolic (congestive) heart failure: Secondary | ICD-10-CM

## 2016-04-20 DIAGNOSIS — I255 Ischemic cardiomyopathy: Secondary | ICD-10-CM

## 2016-04-20 DIAGNOSIS — Z95810 Presence of automatic (implantable) cardiac defibrillator: Secondary | ICD-10-CM | POA: Diagnosis not present

## 2016-04-20 NOTE — Telephone Encounter (Signed)
LMOVM reminding pt to send remote transmission.   

## 2016-04-20 NOTE — Progress Notes (Signed)
EPIC Encounter for ICM Monitoring  Patient Name: [name] is a 65 y.o. female Date: 04/20/2016 Primary Care Physican: Pcp Not In System Primary Cardiologist: Nishan/Bensimhon Electrophysiologist: Allred Dry Weight: 123 lbs       In the past month, have you:  1. Gained more than 2 pounds in a day or more than 5 pounds in a week? Yes, 1 lb  2. Had changes in your medications (with verification of current medications)? no  3. Had more shortness of breath than is usual for you? no  4. Limited your activity because of shortness of breath? no  5. Not been able to sleep because of shortness of breath? no  6. Had increased swelling in your feet, ankles, legs or stomach area? no  7. Had symptoms of dehydration (dizziness, dry mouth, increased thirst, decreased urine output) no  8. Had changes in sodium restriction? no  9. Been compliant with medication? Yes  ICM trend: 3 month view for 04/20/2016   ICM trend: 1 year view for 04/20/2016   Follow-up plan: ICM clinic phone appointment 05/07/2016.    FLUID LEVELS:  Optivol thoracic impedance slightly decreased 04/14/2016 to 04/20/2016 suggesting fluid accumulation.  Patient reported she does not want to keep taking extra Furosemide unless absolutely necessary.    SYMPTOMS:    None.  Denied any symptoms such as weight gain of 3 pounds overnight or 5 pounds within a week, SOB and/or lower extremity swelling.   Encouraged to call for any fluid symptoms.   EDUCATION:   Advised her to limit sodium intake to < 2000 mg and fluid intake to 64 oz daily.   No changes today.    Advised will send to PCP, primary cardiologist and device following physician for review regarding symptoms and  decreased thoracic impedance.  If any recommendations, will call back.    Rosalene Billings, RN, CCM 04/20/2016 2:18 PM

## 2016-04-21 NOTE — Progress Notes (Signed)
Remote ICD transmission.   

## 2016-05-05 LAB — CUP PACEART REMOTE DEVICE CHECK
Battery Voltage: 3.03 V
Brady Statistic RV Percent Paced: 0 %
HIGH POWER IMPEDANCE MEASURED VALUE: 80 Ohm
Implantable Lead Location: 753860
Lead Channel Impedance Value: 600 Ohm
Lead Channel Sensing Intrinsic Amplitude: 15.132 mV
Lead Channel Setting Pacing Amplitude: 2.5 V
Lead Channel Setting Pacing Pulse Width: 0.4 ms
Lead Channel Setting Sensing Sensitivity: 0.3 mV
MDC IDC LEAD IMPLANT DT: 20100212
MDC IDC LEAD MODEL: 6935
MDC IDC SESS DTM: 20170515180233

## 2016-05-07 ENCOUNTER — Ambulatory Visit (INDEPENDENT_AMBULATORY_CARE_PROVIDER_SITE_OTHER): Payer: Medicare Other

## 2016-05-07 DIAGNOSIS — I5022 Chronic systolic (congestive) heart failure: Secondary | ICD-10-CM

## 2016-05-07 DIAGNOSIS — Z95810 Presence of automatic (implantable) cardiac defibrillator: Secondary | ICD-10-CM

## 2016-05-07 NOTE — Progress Notes (Signed)
EPIC Encounter for ICM Monitoring  Patient Name: [name] is a 65 y.o. female Date: 05/07/2016 Primary Care Physican: Pcp Not In System Primary Cardiologist: Nishan/Bensimhon Electrophysiologist: Allred Dry Weight: 123 lbs      In the past month, have you:  1. Gained more than 2 pounds in a day or more than 5 pounds in a week? no  2. Had changes in your medications (with verification of current medications)? no  3. Had more shortness of breath than is usual for you? no  4. Limited your activity because of shortness of breath? no  5. Not been able to sleep because of shortness of breath? no  6. Had increased swelling in your feet, ankles, legs or stomach area? no  7. Had symptoms of dehydration (dizziness, dry mouth, increased thirst, decreased urine output) no  8. Had changes in sodium restriction? no  9. Been compliant with medication? Yes  ICM trend: 3 month view for 05/07/2016   ICM trend: 1 year view for 05/07/2016   Follow-up plan: ICM clinic phone appointment 06/10/2016.   FLUID LEVELS: Optivol thoracic impedance trending along baseline.   SYMPTOMS:  None.  Denied any symptoms such as weight gain of 3 pounds overnight or 5 pounds within a week, SOB and/or lower extremity swelling. Encouraged to call for any fluid symptoms.  RECOMMENDATIONS: No changes today.    Rosalene Billings, RN, CCM 05/07/2016 12:33 PM

## 2016-05-13 ENCOUNTER — Telehealth: Payer: Self-pay | Admitting: Cardiovascular Disease

## 2016-05-13 NOTE — Telephone Encounter (Signed)
New Message  Pt call to make an appt with Dr. Johnsie Cancel; none avail. To offer. I did offer her a AP appt; she refused only wanting to see Provider. Pt stated if something comes avail she ask that the RN would give her a call or if she can be place in his schedule. Please call back to advise

## 2016-05-14 NOTE — Telephone Encounter (Signed)
Patient has a follow-up appointment with Dr. Johnsie Cancel in August.

## 2016-05-20 ENCOUNTER — Encounter: Payer: Self-pay | Admitting: Cardiology

## 2016-06-03 ENCOUNTER — Other Ambulatory Visit: Payer: Self-pay | Admitting: Cardiovascular Disease

## 2016-06-07 ENCOUNTER — Other Ambulatory Visit (HOSPITAL_COMMUNITY): Payer: Self-pay | Admitting: Internal Medicine

## 2016-06-10 ENCOUNTER — Ambulatory Visit (INDEPENDENT_AMBULATORY_CARE_PROVIDER_SITE_OTHER): Payer: Medicare Other

## 2016-06-10 ENCOUNTER — Telehealth: Payer: Self-pay | Admitting: Cardiology

## 2016-06-10 DIAGNOSIS — Z95810 Presence of automatic (implantable) cardiac defibrillator: Secondary | ICD-10-CM | POA: Diagnosis not present

## 2016-06-10 DIAGNOSIS — I5022 Chronic systolic (congestive) heart failure: Secondary | ICD-10-CM

## 2016-06-10 NOTE — Telephone Encounter (Signed)
Spoke with pt and reminded pt of remote transmission that is due today. Pt verbalized understanding.   

## 2016-06-11 ENCOUNTER — Telehealth: Payer: Self-pay

## 2016-06-11 NOTE — Telephone Encounter (Signed)
Remote ICM transmission received.  Attempted patient call and left message for return call.   

## 2016-06-11 NOTE — Progress Notes (Signed)
EPIC Encounter for ICM Monitoring  Patient Name: [name] is a 65 y.o. female Date: 06/11/2016 Primary Care Physican: Pcp Not In System Primary Cardiologist: Nishan/Bensimhon Electrophysiologist: Allred Dry Weight: unknown        Thoracic impedence abnormal suggesting fluid accumulation.  Will repeat transmission on 06/15/2016.  Attempted patient call and unable to reach.    ICM trend: 06/10/2016      Follow-up plan: ICM clinic phone appointment on 06/15/2016.  Copy of ICM check sent to device physician.   Rosalene Billings, RN 06/11/2016 12:52 PM

## 2016-06-15 ENCOUNTER — Ambulatory Visit (INDEPENDENT_AMBULATORY_CARE_PROVIDER_SITE_OTHER): Payer: Medicare Other

## 2016-06-15 ENCOUNTER — Telehealth: Payer: Self-pay | Admitting: Cardiology

## 2016-06-15 DIAGNOSIS — Z95810 Presence of automatic (implantable) cardiac defibrillator: Secondary | ICD-10-CM | POA: Diagnosis not present

## 2016-06-15 DIAGNOSIS — I5022 Chronic systolic (congestive) heart failure: Secondary | ICD-10-CM | POA: Diagnosis not present

## 2016-06-15 NOTE — Telephone Encounter (Signed)
Spoke with pt and reminded pt of remote transmission that is due today. Pt verbalized understanding.   

## 2016-06-15 NOTE — Progress Notes (Signed)
EPIC Encounter for ICM Monitoring  Patient Name: [name] is a 65 y.o. female Date: 06/15/2016 Primary Care Physican: Pcp Not In System Primary Cardiologist: Nishan/Bensimhon Electrophysiologist: Allred Dry Weight: 123 lb         Heart Failure questions reviewed, pt asymptomatic.  Patient reported she had some SOB last week but has resolved now.    Thoracic impedence below reference line 05/25/2016 to 06/14/2016 and returned to baseline 06/14/2016.    Low sodium diet education provided.  She thinks she follows low salt diet but does not review food labels for sodium amount.  Encouraged to check food labels.   Patient gives permission to leave detailed voice mail message on cell and/or home phone.    Recommendations: None.  ICM trend: 06/15/2016       Follow-up plan: ICM clinic phone appointment on 07/21/2016.  Copy of ICM check sent to device physician.   Rosalene Billings, RN 06/15/2016 1:11 PM

## 2016-06-29 ENCOUNTER — Telehealth: Payer: Self-pay

## 2016-06-29 NOTE — Telephone Encounter (Signed)
Tier exception granted for Ezetimibe 10mg  through 06/15/2017.

## 2016-07-08 ENCOUNTER — Encounter: Payer: Self-pay | Admitting: Cardiovascular Disease

## 2016-07-21 ENCOUNTER — Ambulatory Visit (INDEPENDENT_AMBULATORY_CARE_PROVIDER_SITE_OTHER): Payer: Medicare Other

## 2016-07-21 ENCOUNTER — Telehealth: Payer: Self-pay

## 2016-07-21 ENCOUNTER — Ambulatory Visit (INDEPENDENT_AMBULATORY_CARE_PROVIDER_SITE_OTHER): Payer: Medicare Other | Admitting: *Deleted

## 2016-07-21 DIAGNOSIS — Z95810 Presence of automatic (implantable) cardiac defibrillator: Secondary | ICD-10-CM

## 2016-07-21 DIAGNOSIS — I5022 Chronic systolic (congestive) heart failure: Secondary | ICD-10-CM | POA: Diagnosis not present

## 2016-07-21 DIAGNOSIS — I255 Ischemic cardiomyopathy: Secondary | ICD-10-CM

## 2016-07-21 NOTE — Progress Notes (Signed)
EPIC Encounter for ICM Monitoring  Patient Name: [name] is a 65 y.o. female Date: 07/21/2016 Primary Care Physican: Pcp Not In System Primary Cardiologist: Nishan/Bensimhon Electrophysiologist: Allred Dry Weight: unknown        Attempted patient call and unable to reach.  Transmission reviewed.   Thoracic impedance normal.  Follow-up plan: ICM clinic phone appointment on 08/21/2016.  Copy of ICM check sent to device physician.   ICM trend: 07/21/2016       Rosalene Billings, RN 07/21/2016 8:29 AM

## 2016-07-21 NOTE — Telephone Encounter (Signed)
Remote ICM transmission received.  Attempted patient call and left detailed message for return call.   

## 2016-07-21 NOTE — Progress Notes (Signed)
Remote ICD transmission.   

## 2016-07-22 ENCOUNTER — Encounter: Payer: Self-pay | Admitting: Cardiology

## 2016-07-24 ENCOUNTER — Ambulatory Visit: Payer: Medicare Other | Admitting: Cardiovascular Disease

## 2016-07-31 ENCOUNTER — Encounter: Payer: Self-pay | Admitting: Physician Assistant

## 2016-07-31 ENCOUNTER — Ambulatory Visit (INDEPENDENT_AMBULATORY_CARE_PROVIDER_SITE_OTHER): Payer: Medicare Other | Admitting: Physician Assistant

## 2016-07-31 VITALS — BP 116/80 | HR 80 | Ht 63.5 in | Wt 121.4 lb

## 2016-07-31 DIAGNOSIS — I6529 Occlusion and stenosis of unspecified carotid artery: Secondary | ICD-10-CM

## 2016-07-31 DIAGNOSIS — E785 Hyperlipidemia, unspecified: Secondary | ICD-10-CM

## 2016-07-31 DIAGNOSIS — I739 Peripheral vascular disease, unspecified: Secondary | ICD-10-CM | POA: Diagnosis not present

## 2016-07-31 DIAGNOSIS — I10 Essential (primary) hypertension: Secondary | ICD-10-CM

## 2016-07-31 DIAGNOSIS — Z720 Tobacco use: Secondary | ICD-10-CM

## 2016-07-31 DIAGNOSIS — I255 Ischemic cardiomyopathy: Secondary | ICD-10-CM

## 2016-07-31 DIAGNOSIS — I5022 Chronic systolic (congestive) heart failure: Secondary | ICD-10-CM | POA: Diagnosis not present

## 2016-07-31 DIAGNOSIS — I25118 Atherosclerotic heart disease of native coronary artery with other forms of angina pectoris: Secondary | ICD-10-CM

## 2016-07-31 MED ORDER — ISOSORBIDE MONONITRATE ER 30 MG PO TB24: 30.0000 mg | ORAL_TABLET | Freq: Every day | ORAL | 11 refills | Status: DC

## 2016-07-31 NOTE — Patient Instructions (Addendum)
Medication Instructions:  Your physician has recommended you make the following change in your medication:  START Imdur 30mg  daily. An Rx has been sent to your pharmacy   Labwork: Your physician recommends that you return for a FASTING lipid profile, lft, cbc, bmet early next week  Testing/Procedures: Your physician has requested that you have a lower extremity arterial exercise duplex. During this test, exercise and ultrasound are used to evaluate arterial blood flow in the legs. Allow one hour for this exam. There are no restrictions or special instructions. Your physician has requested that you have a carotid duplex. This test is an ultrasound of the carotid arteries in your neck. It looks at blood flow through these arteries that supply the brain with blood. Allow one hour for this exam. There are no restrictions or special instructions.  Your physician has requested that you have a carotid duplex. This test is an ultrasound of the carotid arteries in your neck. It looks at blood flow through these arteries that supply the brain with blood. Allow one hour for this exam. There are no restrictions or special instructions.   Follow-Up: Your physician recommends that you schedule a follow-up appointment with Dr.Nishan first available     Any Other Special Instructions Will Be Listed Below (If Applicable).     If you need a refill on your cardiac medications before your next appointment, please call your pharmacy.

## 2016-07-31 NOTE — Progress Notes (Signed)
Cardiology Office Note    Date:  07/31/2016   ID:  [name], DOB [birth year], MRN [id number]  PCP:  [facility]  Cardiologist:  Dr. Johnsie Cancel Electrophysiologist: Dr. Rayann Heman CHF: Dr. Haroldine Laws PV: Dr. Fletcher Anon  Chief Complaint: 12 Months follow up  History of Present Illness:   [name] is a 65 y.o. female CAD, s/p prior Ant MI, s/p prior POBA to LAD, Dx, RCA, CHF due to ICM with high scar burden by MRI (12/09), s/p ICD, HTN, HL, depression, tobacco abuse, PVD and bilateral carotid stenosis who presented for follow up.   Stopped coreg and bisoprolol due to fatigue. The patient was evaluated by Dr. Haroldine Laws 09/10/15 for advanced therapy for CHF however not felt good candidate due to COPD -->she was on full dose ACE-I and spiro. Unable to tolerate b-blockers of any kind. BP too low to switch to [employer]. F/u in 1 year.   Last LE arterial doppler 08/2014 showed drop in ABI on the left side to 0.56 and 0.78 on the right side. There was evidence of bilateral SFA disease. Dr. Fletcher Anon discussed with her the options of an attempted walking program versus endovascular intervention. She prefers to keep procedures as a last resort. F/u in 6 months.   Carotid Doppler : March 2016 showed 60-79% bilateral ICA stenosis --> f/u in 6 months.   Echo 12/2014 showed EF of 25% (improved from 15% 09/2013), mild MR, PA peak pressure of 75mm Hg.   Last device check 07/21/16 --> pending result.   Presents today for follow up. She is recovering from sinus congestion for the past one week. She walks everyday however has bilateral leg pain after about walking for 10 mins. This has been stable. She occasionally gets chest pressure with extreme exertional and/or uphill walking. No dyspnea. Recently noted taking longer time to work, unable to provide further specification. She is retired. Still smoking 6 cigarette/day. Had episode of L arm tremors July 2017, no further episode. The patient  denies orthopnea, PND, syncope, LE edema, palpitations, melena or blood in his stool or urine.    Past Medical History:  Diagnosis Date  . Arthritis    hands  . Asthma   . CAD (coronary artery disease)    a. s/p prior Ant MI, b. s/p prior POBA to LAD, Dx, RCA,;  c.  LHC (10/12):  dLAD 40, pCFX 30, OM1 50, pRCA 30;  d.  [facility] Myoview (11/14):  High risk study; inferior, anterior, apical defects; minimal reversibility toward the apex; consistent with prior infarct and very mild peri-infarct ischemia, EF 24%, inferior/apical/anterior akinesis  . Chronic systolic heart failure ([place])   . Depression   . H/O hiatal hernia   . HLD (hyperlipidemia)   . HTN (hypertension)   . ICD (implantable cardiac defibrillator) in place   . Ischemic cardiomyopathy    MRI (12/09):  High scar burden, Inf and Apical AK, EF 24%.;  Echo (10/14): EF 15%, Gr 2 DD, mild to mod MR, mod LAE, RVSF mildly reduced, mod RAE, severe TR, PASP 49-53 (mod pulmonary HTN)  . MI (myocardial infarction) ([place])   . Tobacco abuse     Past Surgical History:  Procedure Laterality Date  . CARDIAC DEFIBRILLATOR PLACEMENT  01/18/09   MDT implanted by JA  . cardiac stents    . COLONOSCOPY  03/18/2012   Procedure: COLONOSCOPY;  Surgeon: Beryle Beams, MD;  Location: [facility] ENDOSCOPY;  Service: Endoscopy;  Laterality: N/A;  .  CORONARY ANGIOPLASTY    . ESOPHAGOGASTRODUODENOSCOPY N/A 03/10/2013   Procedure: ESOPHAGOGASTRODUODENOSCOPY (EGD);  Surgeon: Beryle Beams, MD;  Location: [facility];  Service: Endoscopy;  Laterality: N/A;  . INSERT / REPLACE / REMOVE PACEMAKER     "pacemaker is not on"  . LEFT HEART CATHETERIZATION WITH CORONARY ANGIOGRAM N/A 07/26/2014   Procedure: LEFT HEART CATHETERIZATION WITH CORONARY ANGIOGRAM;  Surgeon: Jolaine Artist, MD;  Location: [facility] CATH LAB;  Service: Cardiovascular;  Laterality: N/A;    Current Medications: Prior to Admission medications   Medication Sig Start Date End Date Taking? Authorizing  Provider  aspirin 81 MG tablet Take 81 mg by mouth daily.      Historical Provider, MD  azelastine (ASTELIN) 0.1 % nasal spray Place 1 spray into both nostrils 2 (two) times daily. Use in each nostril as directed    Historical Provider, MD  dexlansoprazole (DEXILANT) 60 MG capsule Take 60 mg by mouth daily.    Historical Provider, MD  ezetimibe (ZETIA) 10 MG tablet Take 0.5 tablets (5 mg total) by mouth 2 (two) times a week. 10/21/15   Jolaine Artist, MD  fexofenadine (ALLEGRA) 180 MG tablet Take 180 mg by mouth daily as needed for allergies.     Historical Provider, MD  fish oil-omega-3 fatty acids 1000 MG capsule Take 2 g by mouth daily.     Historical Provider, MD  Fluticasone-Salmeterol (ADVAIR) 100-50 MCG/DOSE AEPB Inhale 1 puff into the lungs daily.     Historical Provider, MD  furosemide (LASIX) 20 MG tablet TAKE 3 TABLETS BY MOUTH IN THE MORNING AND TAKE 2 TABLETS IN THE EVENING 05/16/15   Jolaine Artist, MD  furosemide (LASIX) 20 MG tablet TAKE 3 TABLETS BY MOUTH IN THE MORNING AND TAKE 2 TABLETS IN THE EVENING 06/10/16   Jolaine Artist, MD  NON FORMULARY OJC, organic juice cleanse dietary supplement, taking by mouth every other day    Historical Provider, MD  PARoxetine (PAXIL) 30 MG tablet Take 30 mg by mouth daily.     Historical Provider, MD  polyethylene glycol (MIRALAX / GLYCOLAX) packet Take 17 g by mouth at bedtime.    Historical Provider, MD  ramipril (ALTACE) 10 MG capsule Take 10 mg by mouth daily.    Historical Provider, MD  ramipril (ALTACE) 10 MG capsule TAKE 1 CAPSULE DAILY 04/20/16   Jolaine Artist, MD  rosuvastatin (CRESTOR) 5 MG tablet TAKE 1 TABLET BY MOUTH EVERY DAY ON [place] FRIDAY 06/03/16   Josue Hector, MD  spironolactone (ALDACTONE) 25 MG tablet TAKE 1 TABLET BY MOUTH EVERY DAY 06/06/15   Jolaine Artist, MD  spironolactone (ALDACTONE) 25 MG tablet TAKE 1 TABLET BY MOUTH EVERY DAY 03/16/16   Jolaine Artist, MD  vitamin B-12  (CYANOCOBALAMIN) 250 MCG tablet Take 250 mcg by mouth daily.    Historical Provider, MD    Allergies:   Zebeta [bisoprolol fumarate] and Statins   Social History   Social History  . Marital status: Divorced    Spouse name: N/A  . Number of children: 2  . Years of education: N/A   Occupational History  . Works for [employer] History Main Topics  . Smoking status: Current Every Day Smoker    Types: Cigarettes  . Smokeless tobacco: Never Used  . Alcohol use No  . Drug use: No  . Sexual activity: No   Other Topics Concern  . None   Social History Narrative  Lives in [place]     Family History:  The patient's family history is not on file.   ROS:   Please see the history of present illness.    ROS All other systems reviewed and are negative.   PHYSICAL EXAM:   VS:  BP 116/80   Pulse 80   Ht 5' 3.5" (1.613 m)   Wt 121 lb 6.4 oz (55.1 kg)   BMI 21.17 kg/m    GEN: Well nourished, well developed, in no acute distress  HEENT: normal  Neck: no JVD, bilateral carotid bruits, or masses Cardiac: RRR; no murmurs, rubs, or gallops,no edema  Respiratory:  clear to auscultation bilaterally, normal work of breathing GI: soft, nontender, nondistended, + BS MS: no deformity or atrophy  Skin: warm and dry, no rash Neuro:  Alert and Oriented x 3, Strength and sensation are intact Psych: euthymic mood, full affect  Wt Readings from Last 3 Encounters:  07/31/16 121 lb 6.4 oz (55.1 kg)  01/20/16 128 lb 6.4 oz (58.2 kg)  09/10/15 127 lb (57.6 kg)      Studies/Labs Reviewed:   EKG:  EKG is not  ordered today.    Recent Labs: 09/10/2015: B Natriuretic Peptide 95.0; BUN 16; Creatinine, Ser 1.06; Potassium 4.4; Sodium 136   Lipid Panel    Component Value Date/Time   CHOL 167 07/16/2015 0955   TRIG 96.0 07/16/2015 0955   HDL 54.80 07/16/2015 0955   CHOLHDL 3 07/16/2015 0955   VLDL 19.2 07/16/2015 0955   LDLCALC 93 07/16/2015 0955   LDLDIRECT 134.7  12/13/2013 1043    Additional studies/ records that were reviewed today include:   ABI 08/2014 R 0.78 L 0.56  MRI (12/09):  High scar burden, Inf and Apical AK, EF 24%.   Echo 12/2014 LV EF: 25%  ------------------------------------------------------------------- Indications:   CHF - 428.0.  ------------------------------------------------------------------- History:  Risk factors: Current tobacco use. Hypertension.  ------------------------------------------------------------------- Study Conclusions  - Left ventricle: Diffuse hypokinesis worse in the inferior wall. The cavity size was severely dilated. Wall thickness was normal. The estimated ejection fraction was 25%. Doppler parameters are consistent with elevated ventricular end-diastolic filling pressure. - Mitral valve: There was mild regurgitation. - Left atrium: The atrium was moderately dilated. - Atrial septum: No defect or patent foramen ovale was identified. - Pulmonary arteries: PA peak pressure: 34 mm Hg (S). - Pericardium, extracardiac: A trivial pericardial effusion was identified.   Lexiscan Myoview (11/14): LV Ejection Fraction: 24%. Extensive scar in the inferior wall, apex and anterior wall. Minimal reversibility  Carotid Doppler : March 2016 showed 60-79% bilateral ICA stenosis --> f/u in 6 months.   Cath 07/26/14 Left main: Calcified 40% mid to distal stenosis LAD: Long vessel wrapping the apex 20-30% plaque in midsection. Distal vessel is small.  LCX: Large OM-1 and large OM-2. 50% lesion in proximal OM-2 RCA: Dominant vessel. Calcified, eccentric 50-60% lesion in midsection . LV-gram done in the RAO projection: Ejection fraction = 25% Severe global HK of mid to distal LV. No MR.    ASSESSMENT & PLAN:   1. CAD s/p previous anterior MI:  Cath 8/15 nonobstructive CAD. Complains of occasional exertional chest pressure that relieves with rest. No dyspnea/nausea or radiation of pain. She  is currently not interested in ischemic evaluation however will consider (call us) if no improvement. Continue ASA and statin. Will add Imdur.   2.  Chronic Systolic CHF/ICM:  EF 123456 (12/2014)  On echo with mild RV dysfunction s/p [employer]  ICD.  Euvolemic  Continue ramipril, lasix and spironolactone at current doses. Does not tolerate beta blockers.  3. PVD with intermittent claudication: ABI 08/2014 R 0.78 L 0.56. She continue to walk. Not interested in angio. Will get LE Arterial doppler. If significant abnormal for previous study --> f/u with Dr. Fletcher Anon. Stop smoking. Continue statin.   4. Bilateral carotid stenosis: F/u duplex [id number] LICA.  Bilateral bruits. Will repeat study.   5. HTN: Stable and well controlled. Continue current regimen.   6. HLD: Continue stain. Will update lipid panel and hepatic panel.   7. Tobacco smoking: Encouraged cessation. Education given.   8. AICD: Pending device check from 07/21/16. Follow by Dr. Rayann Heman.   9. Bruising:  Likely from smoking and ASA. Will get CBC  10. Sinus congestion: f/u with PCP if no improvement   Medication Adjustments/Labs and Tests Ordered: Current medicines are reviewed at length with the patient today.  Concerns regarding medicines are outlined above.  Medication changes, Labs and Tests ordered today are listed in the Patient Instructions below. Patient Instructions  Medication Instructions:  Your physician has recommended you make the following change in your medication:  START Imdur 30mg  daily. An Rx has been sent to your pharmacy   Labwork: Your physician recommends that you return for a FASTING lipid profile, lft, cbc, bmet early next week  Testing/Procedures: Your physician has requested that you have a lower extremity arterial exercise duplex. During this test, exercise and ultrasound are used to evaluate arterial blood flow in the legs. Allow one hour for this exam. There are no restrictions or special  instructions. Your physician has requested that you have a carotid duplex. This test is an ultrasound of the carotid arteries in your neck. It looks at blood flow through these arteries that supply the brain with blood. Allow one hour for this exam. There are no restrictions or special instructions.  Your physician has requested that you have a carotid duplex. This test is an ultrasound of the carotid arteries in your neck. It looks at blood flow through these arteries that supply the brain with blood. Allow one hour for this exam. There are no restrictions or special instructions.   Follow-Up: Your physician recommends that you schedule a follow-up appointment with Dr.Nishan first available     Any Other Special Instructions Will Be Listed Below (If Applicable).     If you need a refill on your cardiac medications before your next appointment, please call your pharmacy.      Jarrett Soho, [place]  07/31/2016 11:44 AM    [facility] [place], [place], [place]  [address] Phone: [phone]; Fax: [phone]

## 2016-08-04 ENCOUNTER — Other Ambulatory Visit: Payer: Medicare Other | Admitting: *Deleted

## 2016-08-04 DIAGNOSIS — E785 Hyperlipidemia, unspecified: Secondary | ICD-10-CM

## 2016-08-04 DIAGNOSIS — I6529 Occlusion and stenosis of unspecified carotid artery: Secondary | ICD-10-CM

## 2016-08-04 LAB — CBC WITH DIFFERENTIAL/PLATELET
BASOS ABS: 0 {cells}/uL (ref 0–200)
BASOS PCT: 0 %
EOS ABS: 166 {cells}/uL (ref 15–500)
Eosinophils Relative: 2 %
HEMATOCRIT: 34.3 % — AB (ref 35.0–45.0)
Hemoglobin: 11.8 g/dL (ref 11.7–15.5)
LYMPHS PCT: 16 %
Lymphs Abs: 1328 cells/uL (ref 850–3900)
MCH: 32.6 pg (ref 27.0–33.0)
MCHC: 34.4 g/dL (ref 32.0–36.0)
MCV: 94.8 fL (ref 80.0–100.0)
MONO ABS: 747 {cells}/uL (ref 200–950)
MPV: 9.7 fL (ref 7.5–12.5)
Monocytes Relative: 9 %
NEUTROS PCT: 73 %
Neutro Abs: 6059 cells/uL (ref 1500–7800)
Platelets: 231 10*3/uL (ref 140–400)
RBC: 3.62 MIL/uL — ABNORMAL LOW (ref 3.80–5.10)
RDW: 13.1 % (ref 11.0–15.0)
WBC: 8.3 10*3/uL (ref 3.8–10.8)

## 2016-08-04 LAB — HEPATIC FUNCTION PANEL
ALK PHOS: 85 U/L (ref 33–130)
ALT: 13 U/L (ref 6–29)
AST: 15 U/L (ref 10–35)
Albumin: 4.2 g/dL (ref 3.6–5.1)
BILIRUBIN DIRECT: 0.1 mg/dL (ref <=0.2)
BILIRUBIN INDIRECT: 0.6 mg/dL (ref 0.2–1.2)
Total Bilirubin: 0.7 mg/dL (ref 0.2–1.2)
Total Protein: 6.7 g/dL (ref 6.1–8.1)

## 2016-08-04 LAB — CUP PACEART REMOTE DEVICE CHECK
Battery Voltage: 3.03 V
Brady Statistic RV Percent Paced: 0 %
Date Time Interrogation Session: 20170815092829
HighPow Impedance: 86 Ohm
Implantable Lead Implant Date: 20100212
Implantable Lead Location: 753860
Implantable Lead Model: 6935
MDC IDC MSMT LEADCHNL RV IMPEDANCE VALUE: 600 Ohm
MDC IDC MSMT LEADCHNL RV SENSING INTR AMPL: 18.093 mV
MDC IDC SET LEADCHNL RV PACING AMPLITUDE: 2.5 V
MDC IDC SET LEADCHNL RV PACING PULSEWIDTH: 0.4 ms
MDC IDC SET LEADCHNL RV SENSING SENSITIVITY: 0.3 mV

## 2016-08-04 LAB — LIPID PANEL
CHOLESTEROL: 165 mg/dL (ref 125–200)
HDL: 65 mg/dL (ref >=46)
LDL Cholesterol: 77 mg/dL (ref <130)
TRIGLYCERIDES: 117 mg/dL (ref <150)
Total CHOL/HDL Ratio: 2.5 Ratio (ref <=5.0)
VLDL: 23 mg/dL (ref <30)

## 2016-08-04 LAB — BASIC METABOLIC PANEL
BUN: 14 mg/dL (ref 7–25)
CALCIUM: 9.7 mg/dL (ref 8.6–10.4)
CO2: 27 mmol/L (ref 20–31)
Chloride: 100 mmol/L (ref 98–110)
Creat: 1.15 mg/dL — ABNORMAL HIGH (ref 0.50–0.99)
GLUCOSE: 90 mg/dL (ref 65–99)
Potassium: 4.6 mmol/L (ref 3.5–5.3)
Sodium: 136 mmol/L (ref 135–146)

## 2016-08-04 NOTE — Addendum Note (Signed)
Addended by: Eulis Foster on: 08/04/2016 10:33 AM   Modules accepted: Orders

## 2016-08-17 ENCOUNTER — Ambulatory Visit (HOSPITAL_COMMUNITY)
Admission: RE | Admit: 2016-08-17 | Discharge: 2016-08-17 | Disposition: A | Discharge status: Home / Self Care | Payer: Medicare Other | Source: Ambulatory Visit | Attending: Cardiology | Admitting: Cardiology

## 2016-08-17 ENCOUNTER — Other Ambulatory Visit: Payer: Self-pay | Admitting: Internal Medicine

## 2016-08-17 DIAGNOSIS — E785 Hyperlipidemia, unspecified: Secondary | ICD-10-CM | POA: Insufficient documentation

## 2016-08-17 DIAGNOSIS — I739 Peripheral vascular disease, unspecified: Secondary | ICD-10-CM | POA: Diagnosis not present

## 2016-08-17 DIAGNOSIS — R938 Abnormal findings on diagnostic imaging of other specified body structures: Secondary | ICD-10-CM | POA: Diagnosis not present

## 2016-08-17 DIAGNOSIS — Z720 Tobacco use: Secondary | ICD-10-CM | POA: Insufficient documentation

## 2016-08-17 DIAGNOSIS — I2510 Atherosclerotic heart disease of native coronary artery without angina pectoris: Secondary | ICD-10-CM | POA: Diagnosis not present

## 2016-08-17 DIAGNOSIS — I10 Essential (primary) hypertension: Secondary | ICD-10-CM | POA: Insufficient documentation

## 2016-08-17 DIAGNOSIS — I6529 Occlusion and stenosis of unspecified carotid artery: Secondary | ICD-10-CM | POA: Diagnosis present

## 2016-08-17 DIAGNOSIS — I6523 Occlusion and stenosis of bilateral carotid arteries: Secondary | ICD-10-CM | POA: Diagnosis not present

## 2016-08-21 ENCOUNTER — Ambulatory Visit (INDEPENDENT_AMBULATORY_CARE_PROVIDER_SITE_OTHER): Payer: Medicare Other

## 2016-08-21 DIAGNOSIS — Z95810 Presence of automatic (implantable) cardiac defibrillator: Secondary | ICD-10-CM | POA: Diagnosis not present

## 2016-08-21 DIAGNOSIS — I5022 Chronic systolic (congestive) heart failure: Secondary | ICD-10-CM

## 2016-08-21 NOTE — Progress Notes (Signed)
EPIC Encounter for ICM Monitoring  Patient Name: [name] is a 65 y.o. female Date: 08/21/2016 Primary Care Physican: [place] Primary Cardiologist:Nishan/Bensimhon Electrophysiologist: Allred Dry Weight: 124 lb         Heart Failure questions reviewed, pt symptomatic with 2 lb weight gain.   Thoracic impedance abnormal suggesting fluid accumulation.  Recommendations:  Increase Furosemide 20 mg to 3 tablets bid x 3 days and return to prescribed dosage after 3rd day.     Follow-up plan: ICM clinic phone appointment on 08/25/2016.  Office appointment with Dr Haroldine Laws 09/09/2016.  Copy of ICM check sent to primary cardiologist and device physician.   ICM trend: 08/21/2016       Rosalene Billings, RN 08/21/2016 12:35 PM

## 2016-08-25 ENCOUNTER — Ambulatory Visit (INDEPENDENT_AMBULATORY_CARE_PROVIDER_SITE_OTHER): Payer: Medicare Other

## 2016-08-25 DIAGNOSIS — Z95810 Presence of automatic (implantable) cardiac defibrillator: Secondary | ICD-10-CM

## 2016-08-25 DIAGNOSIS — I5022 Chronic systolic (congestive) heart failure: Secondary | ICD-10-CM

## 2016-08-25 NOTE — Progress Notes (Signed)
EPIC Encounter for ICM Monitoring  Patient Name: [name] is a 65 y.o. female Date: 08/25/2016 Primary Care Physican: [place] Primary Cardiologist:Nishan/Bensimhon Electrophysiologist: Allred Dry Weight: 123 lb        Heart Failure questions reviewed, pt asymptomatic   Thoracic impedance returned to normal after Furosemide increased for 3 days on 08/21/2016.  Recommendations: No changes.  Low sodium diet education provided.    Follow-up plan: ICM clinic phone appointment on 09/21/2016.  Copy of ICM check sent to primary cardiologist and device physician for update impedance returned to normal.   ICM trend: 08/25/2016       Rosalene Billings, RN 08/25/2016 8:48 AM

## 2016-09-09 ENCOUNTER — Ambulatory Visit (HOSPITAL_COMMUNITY)
Admission: RE | Admit: 2016-09-09 | Discharge: 2016-09-09 | Disposition: A | Discharge status: Home / Self Care | Payer: Medicare Other | Source: Ambulatory Visit | Attending: Internal Medicine | Admitting: Internal Medicine

## 2016-09-09 VITALS — BP 106/54 | HR 85 | Wt 125.5 lb

## 2016-09-09 DIAGNOSIS — R001 Bradycardia, unspecified: Secondary | ICD-10-CM | POA: Insufficient documentation

## 2016-09-09 DIAGNOSIS — Z720 Tobacco use: Secondary | ICD-10-CM | POA: Diagnosis not present

## 2016-09-09 DIAGNOSIS — Z95810 Presence of automatic (implantable) cardiac defibrillator: Secondary | ICD-10-CM | POA: Insufficient documentation

## 2016-09-09 DIAGNOSIS — I255 Ischemic cardiomyopathy: Secondary | ICD-10-CM | POA: Insufficient documentation

## 2016-09-09 DIAGNOSIS — I2510 Atherosclerotic heart disease of native coronary artery without angina pectoris: Secondary | ICD-10-CM | POA: Insufficient documentation

## 2016-09-09 DIAGNOSIS — I5022 Chronic systolic (congestive) heart failure: Secondary | ICD-10-CM | POA: Diagnosis present

## 2016-09-09 DIAGNOSIS — M1990 Unspecified osteoarthritis, unspecified site: Secondary | ICD-10-CM | POA: Diagnosis not present

## 2016-09-09 DIAGNOSIS — Z7982 Long term (current) use of aspirin: Secondary | ICD-10-CM | POA: Insufficient documentation

## 2016-09-09 DIAGNOSIS — I739 Peripheral vascular disease, unspecified: Secondary | ICD-10-CM | POA: Insufficient documentation

## 2016-09-09 DIAGNOSIS — J449 Chronic obstructive pulmonary disease, unspecified: Secondary | ICD-10-CM | POA: Insufficient documentation

## 2016-09-09 DIAGNOSIS — I6523 Occlusion and stenosis of bilateral carotid arteries: Secondary | ICD-10-CM | POA: Diagnosis not present

## 2016-09-09 DIAGNOSIS — I110 Hypertensive heart disease with heart failure: Secondary | ICD-10-CM | POA: Insufficient documentation

## 2016-09-09 DIAGNOSIS — F329 Major depressive disorder, single episode, unspecified: Secondary | ICD-10-CM | POA: Diagnosis not present

## 2016-09-09 DIAGNOSIS — I272 Pulmonary hypertension, unspecified: Secondary | ICD-10-CM | POA: Diagnosis not present

## 2016-09-09 DIAGNOSIS — F17200 Nicotine dependence, unspecified, uncomplicated: Secondary | ICD-10-CM | POA: Insufficient documentation

## 2016-09-09 DIAGNOSIS — Z888 Allergy status to other drugs, medicaments and biological substances status: Secondary | ICD-10-CM | POA: Diagnosis not present

## 2016-09-09 DIAGNOSIS — I252 Old myocardial infarction: Secondary | ICD-10-CM | POA: Diagnosis not present

## 2016-09-09 DIAGNOSIS — E785 Hyperlipidemia, unspecified: Secondary | ICD-10-CM | POA: Insufficient documentation

## 2016-09-09 MED ORDER — SACUBITRIL-VALSARTAN 24-26 MG PO TABS: 1.0000 | ORAL_TABLET | Freq: Two times a day (BID) | ORAL | 3 refills | Status: DC

## 2016-09-09 MED ORDER — FUROSEMIDE 20 MG PO TABS
40.0000 mg | ORAL_TABLET | Freq: Two times a day (BID) | ORAL | 3 refills | Status: DC
Start: 2016-09-09 — End: 2016-10-07

## 2016-09-09 NOTE — Progress Notes (Signed)
Advanced Heart Failure Note   Patient ID: [name], female   DOB: Nov 12, 1951, 65 y.o.   MRN: [id number]  Date:  09/09/2016   ID:  [name], DOB [birth year], MRN [id number]  PCP:  [name] in [place] Electrophysiologist:  Dr. Thompson Grayer  General Cardiologist: Dr Johnsie Cancel HF: Dr Haroldine Laws   History of Present Illness: [name] is a 65 y.o. female who was referred to the HF Clinic for evaluation for advanced therapies.   She has a hx of CAD, s/p prior Ant MI, s/p prior POBA to LAD, Dx, RCA, CHF due to ICM with high scar burden by MRI (12/09), s/p ICD, HTN, HL, depression, tobacco abuse.   ABI 7/14 R 0.93 L 0.95 MRI (12/09):  High scar burden, Inf and Apical AK, EF 24%.  Echo 10/14 EF 15% global HK. RV mild HK  Mod MR. Severe TR. PA 43mmHG   Lexiscan Myoview (11/14): LV Ejection Fraction: 24%. Extensive scar in the inferior wall, apex and anterior wall. Minimal reversibility Carotid Doppler : August 2015 showed 60-79% bilateral ICA stenosis. No previous CVA. Cath 07/26/14 Left main: Calcified 40% mid to distal stenosis LAD: Long vessel wrapping the apex 20-30% plaque in midsection. Distal vessel is small.  LCX: Large OM-1 and large OM-2. 50% lesion in proximal OM-2 RCA: Dominant vessel. Calcified, eccentric 50-60% lesion in midsection . LV-gram done in the RAO projection: Ejection fraction = 25% Severe global HK of mid to distal LV. No MR.    CPX 11/14 FVC 2.08 (67%)  FEV1 1.58 (66%)  FEV1/FVC 76%  Resting HR: 84 Peak HR: 142 (91% age predicted max HR) BP rest: 114/60 BP peak: 148/70 (IPE) Peak VO2: 15.0 (63% predicted peak VO2) VE/VCO2 slope: 32 OUES: 0.85 Peak RER: 1.33 Ventilatory Threshold: 12.1 (50.8% predicted peak VO2) VE/MVV: 60.1% O2pulse: 6 (75% predicted O2pulse)  She presents today for follow up. At last visit was switched to bisoprolol from Coreg due to fatigue. Had to stop bisoprolol for fatigue as well. Feeling good overall. Saw  Vin Bhagat last month and started on Imdur for intermittent chest heaviness which has improved. Also got ABI which were improved. Carotid u/s stable.  Says she is walking regularly and went to the [place] yesterday. Able to do all ADLs and go shopping without too much problem. Struggles with DOE on steps. Still smoking 6-7 cigs/day.   ICD interrogated. Volume status low. Pt activity 4-5 hours daily. No VT/AF  Labs (10/12):  K 3.2, creatinine 0.9, Hgb 13.5 Labs (9/14):    K 3.4, creatinine 1.0, pro BNP 1560, Hgb 11.8, HDL 25, LDL 60, ALT 29 Labs (10/14):  K 3.3=> 4.2, creatinine 1.2, BNP 1175 Labs (10/02/13) K 3.2 creatinine 0.9 Labs (12/13/13): LDL 134, AST 24, ALT 24, TC 221, TG 104 Labs (8/15): K 5.1, creatinine 1.1  Labs (11/15): LDL 79, HDL 67 Labs (3/16): K 3.9, creatinine 1.13  Wt Readings from Last 3 Encounters:  09/09/16 125 lb 8 oz (56.9 kg)  07/31/16 121 lb 6.4 oz (55.1 kg)  01/20/16 128 lb 6.4 oz (58.2 kg)     Past Medical History:  Diagnosis Date  . Arthritis    hands  . Asthma   . CAD (coronary artery disease)    a. s/p prior Ant MI, b. s/p prior POBA to LAD, Dx, RCA,;  c.  LHC (10/12):  dLAD 40, pCFX 30, OM1 50, pRCA 30;  d.  Carlton Adam Myoview (11/14):  High risk study;  inferior, anterior, apical defects; minimal reversibility toward the apex; consistent with prior infarct and very mild peri-infarct ischemia, EF 24%, inferior/apical/anterior akinesis  . Chronic systolic heart failure ([place])   . Depression   . H/O hiatal hernia   . HLD (hyperlipidemia)   . HTN (hypertension)   . ICD (implantable cardiac defibrillator) in place   . Ischemic cardiomyopathy    MRI (12/09):  High scar burden, Inf and Apical AK, EF 24%.;  Echo (10/14): EF 15%, Gr 2 DD, mild to mod MR, mod LAE, RVSF mildly reduced, mod RAE, severe TR, PASP 49-53 (mod pulmonary HTN)  . MI (myocardial infarction)   . Tobacco abuse     Current Outpatient Prescriptions  Medication Sig Dispense Refill  . aspirin  81 MG tablet Take 81 mg by mouth daily.      Marland Kitchen azelastine (ASTELIN) 0.1 % nasal spray Place 1 spray into both nostrils 2 (two) times daily. Use in each nostril as directed    . ezetimibe (ZETIA) 10 MG tablet Take 0.5 tablets (5 mg total) by mouth 2 (two) times a week. 5 tablet 11  . fexofenadine (ALLEGRA) 180 MG tablet Take 180 mg by mouth daily as needed for allergies.     . fish oil-omega-3 fatty acids 1000 MG capsule Take 2 g by mouth daily.     . fluticasone furoate-vilanterol (BREO ELLIPTA) 100-25 MCG/INH AEPB Inhale 1 puff into the lungs daily as needed for shortness of breath or wheezing.    . furosemide (LASIX) 20 MG tablet TAKE 3 TABLETS BY MOUTH IN THE MORNING AND TAKE 2 TABLETS IN THE EVENING 150 tablet 3  . isosorbide mononitrate (IMDUR) 30 MG 24 hr tablet Take 1 tablet (30 mg total) by mouth daily. 30 tablet 11  . NON FORMULARY OJC, organic juice cleanse dietary supplement, taking by mouth every other day    . PARoxetine (PAXIL) 30 MG tablet Take 30 mg by mouth daily.     . polyethylene glycol (MIRALAX / GLYCOLAX) packet Take 17 g by mouth at bedtime.    . ramipril (ALTACE) 10 MG capsule TAKE 1 CAPSULE BY MOUTH DAILY 30 capsule 3  . rosuvastatin (CRESTOR) 5 MG tablet TAKE 1 TABLET BY MOUTH EVERY DAY ON MONDAY, WEDNESDAY,AND FRIDAY 30 tablet 7  . spironolactone (ALDACTONE) 25 MG tablet TAKE 1 TABLET BY MOUTH EVERY DAY 30 tablet 4  . vitamin B-12 (CYANOCOBALAMIN) 250 MCG tablet Take 250 mcg by mouth daily.     No current facility-administered medications for this encounter.     Allergies:    Allergies  Allergen Reactions  . Zebeta [Bisoprolol Fumarate]     Pt states she couldn't think and it messed her head up  . Statins Other (See Comments)    Gradually tired with daily Lipitor, made pt feel bad (Pt can take Crestor 5 mg 4 times per week and Zetia 5 mg every other day)    Social History:  The patient  reports that she has been smoking Cigarettes.  She has never used smokeless  tobacco. She reports that she does not drink alcohol or use drugs.   ROS:  Please see the history of present illness.   All other systems reviewed and negative.   Vitals:   09/09/16 1055  BP: (!) 106/54  Pulse: 85  SpO2: 98%  Weight: 125 lb 8 oz (56.9 kg)    PHYSICAL EXAM:  Well nourished, well developed, in no acute distress  HEENT: normal  Neck: no JVD. +  bilateral L>R carotid bruit Cardiac:  normal S1, S2; brady regular; 1/6 systolic murmur LLSB  Lungs:  Decreased bilateral breath sounds, no wheezing Abd: soft, nontender, non-distended,  no hepatomegaly  Ext: no edema, cyanosis or clubbing. Diminished DP pulses. R>L  Skin: warm and dry  Neuro:  CNs 2-12 intact, no focal abnormalities noted  ASSESSMENT AND PLAN:  1. CAD s/p previous anterior MI:  Cath 8/15 nonobstructive CAD.  No chest pain.  Continue ASA and statin.  2.  Chronic Systolic CHF:  EF 28% (02/1516) due to ischemic cardiomyopathy with mild RV dysfunction s/p Medtronic ICD.  - NYHA II-early III  - Optivol and exam show stable volume status. - Cut lasix back to 40 bid and switch ramipril to entresto 24/26 - Continue spironolactone at current dose.  - Does not tolerate beta blockers. - Repeat ECHO  - F/u with HF PharmD in 4 weeks - I have referred her to the [place] [facility] program.  3. COPD with ongoing tobacco abuse: strongly encouraged her to quit smoking.  4. PAD with Intermittent claudication: Continue medical therapy 5. Bilateral carotid stenosis 6. H/o symptomatic bradycardia: HR ok off BBs.  7. HTN: BP controlled.  8. Hyperlipidemia with intolerance of multiple cholesterol meds: Tolerating Crestor 5 mg four times/week and Zetia two times a week. Lipids stable in 07/2015  Keithon Mccoin,MD 11:01 AM

## 2016-09-09 NOTE — Patient Instructions (Signed)
Decrease Furosemide to 40 mg Twice daily   Stop Ramipril  Start Entresto 24/26 mg Twice daily STARTING Friday 10/6 AM  Your physician has requested that you have an echocardiogram. Echocardiography is a painless test that uses sound waves to create images of your heart. It provides your doctor with information about the size and shape of your heart and how well your heart's chambers and valves are working. This procedure takes approximately one hour. There are no restrictions for this procedure.  You have been referred to [facility], they will contact you to schedule  You have been referred to Erika, pharm D  We will contact you in 6 months to schedule your next appointment.

## 2016-09-12 ENCOUNTER — Other Ambulatory Visit (HOSPITAL_COMMUNITY): Payer: Self-pay | Admitting: Internal Medicine

## 2016-09-17 ENCOUNTER — Ambulatory Visit (HOSPITAL_COMMUNITY): Payer: Medicare Other | Attending: Cardiology

## 2016-09-17 ENCOUNTER — Other Ambulatory Visit: Payer: Self-pay

## 2016-09-17 DIAGNOSIS — I5022 Chronic systolic (congestive) heart failure: Secondary | ICD-10-CM | POA: Diagnosis not present

## 2016-09-17 DIAGNOSIS — J449 Chronic obstructive pulmonary disease, unspecified: Secondary | ICD-10-CM | POA: Diagnosis not present

## 2016-09-17 DIAGNOSIS — Z720 Tobacco use: Secondary | ICD-10-CM | POA: Diagnosis not present

## 2016-09-17 DIAGNOSIS — E785 Hyperlipidemia, unspecified: Secondary | ICD-10-CM | POA: Diagnosis not present

## 2016-09-17 DIAGNOSIS — I255 Ischemic cardiomyopathy: Secondary | ICD-10-CM | POA: Diagnosis not present

## 2016-09-17 DIAGNOSIS — I110 Hypertensive heart disease with heart failure: Secondary | ICD-10-CM | POA: Insufficient documentation

## 2016-09-17 DIAGNOSIS — I358 Other nonrheumatic aortic valve disorders: Secondary | ICD-10-CM | POA: Insufficient documentation

## 2016-09-17 DIAGNOSIS — I059 Rheumatic mitral valve disease, unspecified: Secondary | ICD-10-CM | POA: Diagnosis not present

## 2016-09-17 DIAGNOSIS — I2510 Atherosclerotic heart disease of native coronary artery without angina pectoris: Secondary | ICD-10-CM | POA: Insufficient documentation

## 2016-09-17 DIAGNOSIS — I509 Heart failure, unspecified: Secondary | ICD-10-CM | POA: Diagnosis present

## 2016-09-17 LAB — ECHOCARDIOGRAM COMPLETE
AVLVOTPG: 2 mmHg
Ao-asc: 27 cm
CHL CUP DOP CALC LVOT VTI: 13.7 cm
CHL CUP RV SYS PRESS: 30 mmHg
CHL CUP TV REG PEAK VELOCITY: 258 cm/s
E/e' ratio: 29.09
EWDT: 194 ms
FS: 7 % — AB (ref 28–44)
IV/PV OW: 0.48
LA ID, A-P, ES: 42 mm
LA diam index: 2.63 cm/m2
LA vol A4C: 48 ml
LA vol: 51 mL
LAVOLIN: 31.9 mL/m2
LDCA: 3.46 cm2
LEFT ATRIUM END SYS DIAM: 42 mm
LV E/e' medial: 29.09
LV E/e'average: 29.09
LV e' LATERAL: 3.07 cm/s
LVOT SV: 47 mL
LVOTD: 21 mm
LVOTPV: 61.7 cm/s
MV Dec: 194
MV Peak grad: 3 mmHg
MV VTI: 163 cm
MV pk E vel: 89.3 m/s
MVPKAVEL: 107 m/s
PISA EROA: 0.26 cm2
PW: 8.95 mm — AB (ref 0.6–1.1)
TDI e' lateral: 3.07
TDI e' medial: 4.17
TRMAXVEL: 258 cm/s

## 2016-09-18 ENCOUNTER — Encounter: Payer: Self-pay | Admitting: Internal Medicine

## 2016-09-21 ENCOUNTER — Ambulatory Visit (INDEPENDENT_AMBULATORY_CARE_PROVIDER_SITE_OTHER): Payer: Medicare Other

## 2016-09-21 ENCOUNTER — Telehealth: Payer: Self-pay

## 2016-09-21 DIAGNOSIS — I5022 Chronic systolic (congestive) heart failure: Secondary | ICD-10-CM

## 2016-09-21 DIAGNOSIS — Z95810 Presence of automatic (implantable) cardiac defibrillator: Secondary | ICD-10-CM

## 2016-09-21 NOTE — Progress Notes (Signed)
EPIC Encounter for ICM Monitoring  Patient Name: [name] is a 65 y.o. female Date: 09/21/2016 Primary Care Physican: [place] Primary Cardiologist:Nishan/Bensimhon Electrophysiologist: Allred Dry Weight: 125 lb    Heart Failure questions reviewed, pt symptomatic with stomach bloating, increase in breathing difficulty and generally not feeling well.     Thoracic impedance abnormal suggesting fluid accumulation.  Furosemide dosage was decreased to 40 mg bid at office visit with Dr Clayborne Dana on 10/4 and started accumulating fluid 10/5.    Labs: 08/04/2016 Creatinine 1.15, BUN 14, Potassium 4.6, Sodium 136 09/10/2015 Creatinine 1.06, BUN 16, Potassium 4.4, Sodium 136 02/27/2015 Creatinine 1.13, BUN 18, Potassium 3.9, Sodium 135  Recommendations:   Increase Furosemide 20 mg 3 tablets in am and 3 tablets in pm for 3 days only and after 3rd day return to prescribed dosage of 40 mg bid.  She verbalized understanding and repeated back.  Follow-up plan: ICM clinic phone appointment on 09/28/2016 to recheck fluid levels and 10/22/2016.  Office appointment with Dr Johnsie Cancel on 10/28/2016.  Copy of ICM check sent to Dr Haroldine Laws, Dr Johnsie Cancel and Dr Rayann Heman for review and any further recommendations.  ICM trend: 09/21/2016       Rosalene Billings, RN 09/21/2016 8:56 AM

## 2016-09-23 NOTE — Progress Notes (Signed)
[name] Report   Patient Details  Name: [name] MRN: [id number] Date of Birth: 05/27/1951 Age: 65 y.o. PCP: [place]  There were no vitals filed for this visit.       [name] - 09/23/16 1300      Referral    Referring Provider Dr. Haroldine Laws   Reason for referral Heart Failure   Program Start Date 09/23/16     Measurement   Waist Circumference 35.2 inches   Hip Circumference 35.8 inches     Information for Trainer   Goals "to get stronger and my heart to get stronger & work on my stomach where I hold my fluid"   Current Exercise yard work & house work   Orthopedic Concerns tendonitis and arthritis in hands.   Pertinent Medical History chf, htn, high cholesterol, asthma, smokes 6 cigs/day   Current Barriers none   Medications that affect exercise Asthma inhaler  she also gets dizzy on occasion     Quality of Life Assessment [RPT]   Date of Last Quality of Life Assessment [RPT] 09/23/16     Mobility and Daily Activities   I find it easy to walk up or down two or more flights of stairs. 2   I have no trouble taking out the trash. 4   I do housework such as vacuuming and dusting on my own without difficulty. 3   I can easily lift a gallon of milk (8lbs). 4   I can easily walk a mile. 3   I have no trouble reaching into high cupboards or reaching down to pick up something from the floor. 4   I do not have trouble doing out-door work such as Armed forces logistics/support/administrative officer, raking leaves, or gardening. 3     Mobility and Daily Activities   I feel younger than my age. 2   I feel independent. 4   I feel energetic. 2   I live an active life.  3   I feel strong. 3   I feel healthy. 2   I feel active as other people my age. 4     How fit and strong are you.   Fit and Strong Total Score 43     Past Medical History:  Diagnosis Date  . Arthritis    hands  . Asthma   . CAD (coronary artery disease)    a. s/p prior Ant  MI, b. s/p prior POBA to LAD, Dx, RCA,;  c.  LHC (10/12):  dLAD 40, pCFX 30, OM1 50, pRCA 30;  d.  Carlton Adam Myoview (11/14):  High risk study; inferior, anterior, apical defects; minimal reversibility toward the apex; consistent with prior infarct and very mild peri-infarct ischemia, EF 24%, inferior/apical/anterior akinesis  . Chronic systolic heart failure ([place])   . Depression   . H/O hiatal hernia   . HLD (hyperlipidemia)   . HTN (hypertension)   . ICD (implantable cardiac defibrillator) in place   . Ischemic cardiomyopathy    MRI (12/09):  High scar burden, Inf and Apical AK, EF 24%.;  Echo (10/14): EF 15%, Gr 2 DD, mild to mod MR, mod LAE, RVSF mildly reduced, mod RAE, severe TR, PASP 49-53 (mod pulmonary HTN)  . MI (myocardial infarction)   . Tobacco abuse    Past Surgical History:  Procedure Laterality Date  . CARDIAC DEFIBRILLATOR PLACEMENT  01/18/09   MDT implanted by JA  . cardiac stents    .  COLONOSCOPY  03/18/2012   Procedure: COLONOSCOPY;  Surgeon: Beryle Beams, MD;  Location: [facility] ENDOSCOPY;  Service: Endoscopy;  Laterality: N/A;  . CORONARY ANGIOPLASTY    . ESOPHAGOGASTRODUODENOSCOPY N/A 03/10/2013   Procedure: ESOPHAGOGASTRODUODENOSCOPY (EGD);  Surgeon: Beryle Beams, MD;  Location: [facility];  Service: Endoscopy;  Laterality: N/A;  . INSERT / REPLACE / REMOVE PACEMAKER     "pacemaker is not on"  . LEFT HEART CATHETERIZATION WITH CORONARY ANGIOGRAM N/A 07/26/2014   Procedure: LEFT HEART CATHETERIZATION WITH CORONARY ANGIOGRAM;  Surgeon: Jolaine Artist, MD;  Location: [facility] CATH LAB;  Service: Cardiovascular;  Laterality: N/A;   History  Smoking Status  . Current Every Day Smoker  . Types: Cigarettes  Smokeless Tobacco  . Never Used     1 Wellness sessions attended.  Comments: Attended first class then registered afterwards.      [name]'s preferred day for training is Monday, Tuesday and Wednesday and preferred time is Morning (9AM -12PM)   [name] 09/23/2016, 1:33 PM

## 2016-09-28 ENCOUNTER — Ambulatory Visit (INDEPENDENT_AMBULATORY_CARE_PROVIDER_SITE_OTHER): Payer: Medicare Other

## 2016-09-28 ENCOUNTER — Telehealth: Payer: Self-pay

## 2016-09-28 DIAGNOSIS — Z95810 Presence of automatic (implantable) cardiac defibrillator: Secondary | ICD-10-CM

## 2016-09-28 DIAGNOSIS — I5022 Chronic systolic (congestive) heart failure: Secondary | ICD-10-CM

## 2016-09-28 NOTE — Telephone Encounter (Signed)
Remote ICM transmission received.  Attempted patient call and left detailed message regarding transmission and to return call.

## 2016-09-28 NOTE — Progress Notes (Signed)
EPIC Encounter for ICM Monitoring  Patient Name: [name] is a 65 y.o. female Date: 09/28/2016 Primary Care Physican: [place] Primary Cardiologist:Nishan/Bensimhon Electrophysiologist: Allred Dry Weight:  unknown        Attempted ICM call and unable to reach.  Left detailed message regarding transmission and to return call.  Transmission reviewed.    Thoracic impedance continues to be abnormal suggesting fluid accumulation after increasing Furosemide to 60 mg am and 40 mg pm x 3 days on 09/21/2016.   Patient has been accumulating fluid since a permanent decrease in Furosemide dosage to 40 mg bid on 10/4.   Labs: 08/04/2016 Creatinine 1.15, BUN 14, Potassium 4.6, Sodium 136 09/10/2015 Creatinine 1.06, BUN 16, Potassium 4.4, Sodium 136 02/27/2015 Creatinine 1.13, BUN 18, Potassium 3.9, Sodium 135  Follow-up plan: ICM clinic phone appointment on 10/05/2016 to recheck fluid levels.  Copy of ICM check sent to primary cardiologist and device physician.   ICM trend: 09/28/2016       Rosalene Billings, RN 09/28/2016 2:11 PM

## 2016-09-30 NOTE — Progress Notes (Signed)
[facility] PREP Weekly Session   Patient Details  Name: [name] MRN: [id number] Date of Birth: [birth year] Age: 65 y.o. PCP: [facility]:   09/30/16 1319  Weight: 127 lb 8 oz (57.8 kg)        [facility] [employer] Weekly seesion - 09/30/16 1300      Weekly Session   Topic Discussed Other  Fat and calorie detective   Minutes exercised this week 0 minutes  meeting with trainer today for first time   Classes attended to date 2   Comments "grateful for kids, grandkids, and home"       [name] 09/30/2016, 1:20 PM

## 2016-10-01 ENCOUNTER — Other Ambulatory Visit (HOSPITAL_COMMUNITY): Payer: Self-pay | Admitting: Internal Medicine

## 2016-10-05 ENCOUNTER — Ambulatory Visit (INDEPENDENT_AMBULATORY_CARE_PROVIDER_SITE_OTHER): Payer: Medicare Other

## 2016-10-05 ENCOUNTER — Telehealth: Payer: Self-pay

## 2016-10-05 DIAGNOSIS — I5022 Chronic systolic (congestive) heart failure: Secondary | ICD-10-CM

## 2016-10-05 DIAGNOSIS — Z95810 Presence of automatic (implantable) cardiac defibrillator: Secondary | ICD-10-CM

## 2016-10-05 NOTE — Progress Notes (Signed)
EPIC Encounter for ICM Monitoring  Patient Name: [name] is a 65 y.o. female Date: 10/05/2016 Primary Care Physican: [place] Primary Cardiologist:Nishan/Bensimhon Electrophysiologist: Allred Dry Weight:unknown                Attempted ICM call and unable to reach.  Transmission reviewed.   Thoracic impedance continues to be abnormal suggesting fluid accumulation after increasing Furosemide to 60 mg am and 40 mg pm x 3 days on 09/21/2016. Patient has been accumulating fluid since a permanent decrease in Furosemide dosage to 40 mg bid on 10/4.  Labs: 08/04/2016 Creatinine 1.15, BUN 14, Potassium 4.6, Sodium 136 09/10/2015 Creatinine 1.06, BUN 16, Potassium 4.4, Sodium 136 02/27/2015 Creatinine 1.13, BUN 18, Potassium 3.9, Sodium 135  Recommendations: NONE - Unable to reach  Follow-up plan: ICM clinic phone appointment on 10/22/2016 to recheck fluid levels.  Office appointment with Dr Johnsie Cancel on 10/28/2016  Copy of ICM check sent to cardiologists and device physician.   ICM trend: 10/05/2016       Rosalene Billings, RN 10/05/2016 3:23 PM

## 2016-10-05 NOTE — Telephone Encounter (Signed)
Remote ICM transmission received.  Attempted patient call and left message to return call.   

## 2016-10-07 ENCOUNTER — Ambulatory Visit (HOSPITAL_COMMUNITY)
Admission: RE | Admit: 2016-10-07 | Discharge: 2016-10-07 | Disposition: A | Discharge status: Home / Self Care | Payer: Medicare Other | Source: Ambulatory Visit | Attending: Cardiology | Admitting: Cardiology

## 2016-10-07 VITALS — BP 104/60 | HR 88 | Wt 125.6 lb

## 2016-10-07 DIAGNOSIS — Z9889 Other specified postprocedural states: Secondary | ICD-10-CM | POA: Insufficient documentation

## 2016-10-07 DIAGNOSIS — J449 Chronic obstructive pulmonary disease, unspecified: Secondary | ICD-10-CM | POA: Diagnosis not present

## 2016-10-07 DIAGNOSIS — I6523 Occlusion and stenosis of bilateral carotid arteries: Secondary | ICD-10-CM | POA: Diagnosis not present

## 2016-10-07 DIAGNOSIS — E785 Hyperlipidemia, unspecified: Secondary | ICD-10-CM | POA: Insufficient documentation

## 2016-10-07 DIAGNOSIS — I5022 Chronic systolic (congestive) heart failure: Secondary | ICD-10-CM | POA: Insufficient documentation

## 2016-10-07 DIAGNOSIS — I2510 Atherosclerotic heart disease of native coronary artery without angina pectoris: Secondary | ICD-10-CM | POA: Diagnosis not present

## 2016-10-07 DIAGNOSIS — F17200 Nicotine dependence, unspecified, uncomplicated: Secondary | ICD-10-CM | POA: Diagnosis not present

## 2016-10-07 DIAGNOSIS — I252 Old myocardial infarction: Secondary | ICD-10-CM | POA: Insufficient documentation

## 2016-10-07 DIAGNOSIS — I110 Hypertensive heart disease with heart failure: Secondary | ICD-10-CM | POA: Insufficient documentation

## 2016-10-07 LAB — BASIC METABOLIC PANEL
Anion gap: 6 (ref 5–15)
BUN: 16 mg/dL (ref 6–20)
CHLORIDE: 102 mmol/L (ref 101–111)
CO2: 28 mmol/L (ref 22–32)
CREATININE: 1.23 mg/dL — AB (ref 0.44–1.00)
Calcium: 9.9 mg/dL (ref 8.9–10.3)
GFR calc Af Amer: 51 mL/min — ABNORMAL LOW (ref >60)
GFR calc non Af Amer: 44 mL/min — ABNORMAL LOW (ref >60)
GLUCOSE: 115 mg/dL — AB (ref 65–99)
POTASSIUM: 4.2 mmol/L (ref 3.5–5.1)
Sodium: 136 mmol/L (ref 135–145)

## 2016-10-07 NOTE — Progress Notes (Signed)
HPI:  [name] is a 65 y.o. Caucasian female who was referred to the HF Clinic for evaluation for advanced therapies.   She has a hx of CAD, s/p prior Ant MI, s/p prior POBA to LAD, Dx, RCA, CHF due to ICM with high scar burden by MRI (12/09), s/p ICD, HTN, HL, depression, tobacco abuse.   She presents today for pharmacist-led HF medication titration. At last HF clinic visit on 10/4, her furosemide was cut back to 40 mg BID and she was switched from ramipril to Entresto 24/26 mg BID. Based on her Optivol results over the last few weeks c/w fluid accumulation, she self-titrated her dose of furosemide back to 60 mg qam/40 mg qpm about a week ago. She states that she has been feeling less congested since doing this but her abdomen is still somewhat distended. She did state that she starts with [facility] exercise program on Wednesday.      . Shortness of breath/dyspnea on exertion? Yes  . Orthopnea/PND? No . Edema? Yes - none peripherally but +abdominal edema . Lightheadedness/dizziness? Yes - lightheaded . Daily weights at home? Yes - 123.5-124 lb  . Blood pressure/heart rate monitoring at home? No . Following low-sodium/fluid-restricted diet? Yes - stays away from salty/fried foods; <2 L fluid intake  HF Medications: Furosemide 60 mg qam/40 mg qpm  Entresto 24-26 mg PO BID Spironolactone 25 mg PO daily   Precautions/Contraindications: Beta blockers cause profound bradycardia  Has the patient been experiencing any side effects to the medications prescribed?  no  Does the patient have any problems obtaining medications due to transportation or finances?   Yes - her Delene Loll will be $45/mo and she cannot afford this so will enroll her in [employer]. Verified with [facility] in [place] $0 copay.   Billing ID: [id number] Person Code: [id number] RX Group: [id number] RX BIN: [id number] PCN for Part D: [id number]    Understanding of regimen: good Understanding of indications: good Potential of  compliance: good Patient understands to avoid NSAIDs. Patient understands to avoid decongestants.    Pertinent Lab Values: . 10/07/16: Serum creatinine 1.23 (BL ~1-1.1), BUN 16, Potassium 4.2, Sodium 136  Vital Signs: . Weight: 125 lb (dry weight: 125 lb) . Blood pressure: 104/60 mmHg  . Heart rate: 88 bpm   Assessment: 1. Chronicsystolic CHF (EF 97-02%), due to ICM. NYHA class II-IIIsymptoms.  - Optivol transmission results since decreasing furosemide dose c/w fluid accumulation  - Continue previous dose of furosemide 60 mg qam/40 mg qpm  - Continue Entresto 24/26 mg BID and spironolactone 25 mg daily (will not titrate up with soft BP and occasional dizziness)   - No BB with previous intolerance 2/2 profound fatigue/bradycardia - Basic disease state pathophysiology, medication indication, mechanism and side effects reviewed at length with patient and he verbalized understanding 2. CAD s/p previous anterior MI: Cath 8/15 nonobstructive CAD  - Continue ASA and statin 3. COPD with ongoing tobacco abus  - Again strongly encouraged her to quit smoking  4. PAD with Intermittent claudication  - Continue medical therapy/Imdur 5. Bilateral carotid stenosis 6. H/o symptomatic bradycardia  - HR ok off BBs 7. HTN  - BP controlled  8. Hyperlipidemia with intolerance of multiple cholesterol meds  - Lipids stable in 07/2015  - Tolerating Crestor 5 mg four times/week and Zetia two times a week   Plan: 1) Medication changes: Based on clinical presentation, vital signs and recent labs will continue furosemide 60 mg qam/40 mg qpm  2)  Labs: BMET today 3) Follow-up: 5 months with Dr. Leeanne Mannan K. Velva Harman, PharmD, BCPS, CPP Clinical Pharmacist Pager: [phone] Phone: [phone] 10/07/2016 1:33 PM   Chart reviewed. Agree with plan as above.  Glori Bickers MD

## 2016-10-07 NOTE — Patient Instructions (Signed)
It was great to see you today!  Please continue your furosemide 60 mg (3 tablets) in the morning and 40 mg (2 tablets) in the afternoon.   Labs today. We will call you with any abnormalities.   We will call you for an appointment with Dr. Haroldine Laws in 5-6 months.

## 2016-10-07 NOTE — Progress Notes (Signed)
[facility] [employer] PREP Weekly Session   Patient Details  Name: [name] MRN: [id number] Date of Birth: 01/04/1951 Age: 65 y.o. PCP: [place]:   10/07/16 1238  Weight: 126 lb 6.4 oz (57.3 kg)        [facility] [employer] Weekly seesion - 10/07/16 1200      Weekly Session   Topic Discussed Healthy eating tips   Minutes exercised this week 0 minutes  meeting with trainer for first session today after class   Classes attended to date 3   Comments "grateful for home, family, health"       [name] 10/07/2016, 12:38 PM

## 2016-10-14 NOTE — Progress Notes (Signed)
[facility] PREP Weekly Session   Patient Details  Name: [name] MRN: [id number] Date of Birth: 1950-12-17 Age: 65 y.o. PCP: [place]:   10/14/16 1415  Weight: 127 lb (57.6 kg)        [facility] [employer] Weekly seesion - 10/14/16 1400      Weekly Session   Topic Discussed Other ways to be active   Classes attended to date 4   Comments "grateful for home, God, family, health."       [name] 10/14/2016, 2:16 PM

## 2016-10-21 NOTE — Progress Notes (Signed)
[facility] PREP Weekly Session   Patient Details  Name: [name] MRN: [id number] Date of Birth: 02/04/1951 Age: 65 y.o. PCP: [place]:   10/21/16 1237  Weight: 126 lb 12.8 oz (57.5 kg)        [facility] [employer] Weekly seesion - 10/21/16 1200      Weekly Session   Topic Discussed Eating for the season   Minutes exercised this week 225 minutes  180 cardio/45 strength   Classes attended to date [place] 10/21/2016, 12:39 PM

## 2016-10-22 ENCOUNTER — Ambulatory Visit (INDEPENDENT_AMBULATORY_CARE_PROVIDER_SITE_OTHER): Payer: Medicare Other | Admitting: *Deleted

## 2016-10-22 DIAGNOSIS — I5022 Chronic systolic (congestive) heart failure: Secondary | ICD-10-CM

## 2016-10-22 DIAGNOSIS — I255 Ischemic cardiomyopathy: Secondary | ICD-10-CM | POA: Diagnosis not present

## 2016-10-22 DIAGNOSIS — Z95810 Presence of automatic (implantable) cardiac defibrillator: Secondary | ICD-10-CM

## 2016-10-22 NOTE — Progress Notes (Signed)
Remote ICD transmission.   

## 2016-10-23 NOTE — Progress Notes (Signed)
EPIC Encounter for ICM Monitoring  Patient Name: [name] is a 65 y.o. female Date: 10/23/2016 Primary Care Physican: [place] Primary Cardiologist:Nishan/Bensimhon Electrophysiologist: Allred Dry Weight: 124.5 lbs   Heart Failure questions reviewed, pt asymptomatic   Thoracic impedance abnormal suggesting fluid accumulation about 09/12/2016.  Patient did not return to prescribed dosage of Furosemide 40 mg bid after a temporary increase on 09/21/2016 for 3 days.  Dr Haroldine Laws had decreased the Furosemide dosage on 09/09/2016 from 60 mg am and 40 mg pm to 40 mg bid.      Labs: 10/07/2016 Creatinine 1.23, BUN 16, Potassium 4.2, Sodium 136 08/04/2016 Creatinine 1.15, BUN 14, Potassium 4.6, Sodium 136 09/10/2015 Creatinine 1.06, BUN 16, Potassium 4.4, Sodium 136 02/27/2015 Creatinine 1.13, BUN 18, Potassium 3.9, Sodium 135   Recommendations:  Advised will send copy of note to Dr Johnsie Cancel, Dr Haroldine Laws and Dr Rayann Heman.  Patient will be seen in the office on 10/28/2016.   Discussed at length regarding low salt foods and she has not been following a low salt diet.  Reinforced low salt food choices with limit of 2000 mg daily and limiting fluid intake to < 2 liters per day. Encouraged to call for fluid symptoms.    Follow-up plan: ICM clinic phone appointment on 10/27/2016.  Office appointment with Dr Johnsie Cancel 10/28/2016.    ICM trend: 10/22/2016       Rosalene Billings, RN 10/23/2016 1:39 PM

## 2016-10-26 NOTE — Progress Notes (Signed)
Patient ID: [name], female   DOB: 1951-09-05, 65 y.o.   MRN: [id number]  Date:  10/28/2016   ID:  [name], DOB 1951/01/30, MRN [id number]  PCP:  [name] in [place] Electrophysiologist:  Dr. Thompson Grayer  General Cardiologist: Dr Johnsie Cancel HF: Dr Haroldine Laws   History of Present Illness: [name] is a 65 y.o. female with CHF.  Last seen 09/09/16 by Dr Jeffie Pollock   She has a hx of CAD, s/p prior Ant MI, s/p prior POBA to LAD, Dx, RCA, CHF due to ICM with high scar burden by MRI (12/09), s/p ICD, HTN, HL, depression, tobacco abuse.   Last Echo 09/17/16 no change in EF  Study Conclusions  - Left ventricle: The cavity size was moderately dilated. Wall   thickness was normal. Systolic function was severely reduced. The   estimated ejection fraction was in the range of 25% to 30%.   Diffuse hypokinesis. Doppler parameters are consistent with   abnormal left ventricular relaxation (grade 1 diastolic   dysfunction). Doppler parameters are consistent with high   ventricular filling pressure. Internal dimension, ED (PLAX   chordal): 65.9 mm. - Aortic valve: Trileaflet; mildly thickened, mildly calcified   leaflets. - Mitral valve: Calcified annulus. There was moderate   regurgitation. - Left atrium: The atrium was mildly dilated. Anterior-posterior   dimension: 42 mm. - Right ventricle: Pacer wire or catheter noted in right ventricle. - Pulmonary arteries: Systolic pressure was mildly increased.  Impressions:  - Compared to the prior study, there has been no significant   interval change.  ABI 08/17/16 Right 1.0 left .70  Carotid Doppler : 08/17/16  Bilateral 40-59% ICA stenosis .  Cath 07/26/14 Left main: Calcified 40% mid to distal stenosis LAD: Long vessel wrapping the apex 20-30% plaque in midsection. Distal vessel is small.  LCX: Large OM-1 and large OM-2. 50% lesion in proximal OM-2 RCA: Dominant vessel. Calcified, eccentric 50-60%  lesion in midsection . LV-gram done in the RAO projection: Ejection fraction = 25% Severe global HK of mid to distal LV. No MR.    Fatigue with bisoprolol and coreg Tolerating nitrates. ICM monitoring with elevated Volume starting in October.  Feels bloated and weight up about 2 lbs Does not get LE edema accumulates fluid in abdomen  Lab Results  Component Value Date   CREATININE 1.23 (H) 10/07/2016   BUN 16 10/07/2016   NA 136 10/07/2016   K 4.2 10/07/2016   CL 102 10/07/2016   CO2 28 10/07/2016     Wt Readings from Last 3 Encounters:  10/28/16 57.9 kg (127 lb 9.6 oz)  10/21/16 57.5 kg (126 lb 12.8 oz)  10/14/16 57.6 kg (127 lb)     Past Medical History:  Diagnosis Date  . Arthritis    hands  . Asthma   . CAD (coronary artery disease)    a. s/p prior Ant MI, b. s/p prior POBA to LAD, Dx, RCA,;  c.  LHC (10/12):  dLAD 40, pCFX 30, OM1 50, pRCA 30;  d.  [facility] Myoview (11/14):  High risk study; inferior, anterior, apical defects; minimal reversibility toward the apex; consistent with prior infarct and very mild peri-infarct ischemia, EF 24%, inferior/apical/anterior akinesis  . Chronic systolic heart failure ([place])   . Depression   . H/O hiatal hernia   . HLD (hyperlipidemia)   . HTN (hypertension)   . ICD (implantable cardiac defibrillator) in place   . Ischemic cardiomyopathy  MRI (12/09):  High scar burden, Inf and Apical AK, EF 24%.;  Echo (10/14): EF 15%, Gr 2 DD, mild to mod MR, mod LAE, RVSF mildly reduced, mod RAE, severe TR, PASP 49-53 (mod pulmonary HTN)  . MI (myocardial infarction)   . Tobacco abuse     Current Outpatient Prescriptions  Medication Sig Dispense Refill  . acetaminophen (TYLENOL ARTHRITIS PAIN) 650 MG CR tablet Take 650 mg by mouth every 8 (eight) hours as needed for pain.    Marland Kitchen aspirin 81 MG tablet Take 81 mg by mouth daily.      Marland Kitchen azelastine (ASTELIN) 0.1 % nasal spray Place 1 spray into both nostrils 2 (two) times daily. Use in each  nostril as directed    . busPIRone (BUSPAR) 5 MG tablet Take 5 mg by mouth 2 (two) times daily as needed for anxiety.    Marland Kitchen ezetimibe (ZETIA) 10 MG tablet Take 0.5 tablets (5 mg total) by mouth 2 (two) times a week. 5 tablet 11  . fexofenadine (ALLEGRA) 180 MG tablet Take 180 mg by mouth daily as needed for allergies.     . fish oil-omega-3 fatty acids 1000 MG capsule Take 2 g by mouth daily.     . fluticasone furoate-vilanterol (BREO ELLIPTA) 100-25 MCG/INH AEPB Inhale 1 puff into the lungs daily as needed for shortness of breath or wheezing.    . furosemide (LASIX) 20 MG tablet Take 40 mg by mouth 2 (two) times daily.    . isosorbide mononitrate (IMDUR) 30 MG 24 hr tablet Take 1 tablet (30 mg total) by mouth daily. 30 tablet 11  . NON FORMULARY OJC, organic juice cleanse dietary supplement, taking by mouth every other day    . PARoxetine (PAXIL) 30 MG tablet Take 30 mg by mouth daily.     . polyethylene glycol (MIRALAX / GLYCOLAX) packet Take 17 g by mouth at bedtime.    . rosuvastatin (CRESTOR) 5 MG tablet Take 5 mg by mouth 3 (three) times a week.    . sacubitril-valsartan (ENTRESTO) 24-26 MG Take 1 tablet by mouth 2 (two) times daily. 60 tablet 3  . spironolactone (ALDACTONE) 25 MG tablet TAKE 1 TABLET BY MOUTH EVERY DAY 30 tablet 4  . vitamin B-12 (CYANOCOBALAMIN) 250 MCG tablet Take 250 mcg by mouth daily.     No current facility-administered medications for this visit.     Allergies:    Allergies  Allergen Reactions  . Zebeta [Bisoprolol Fumarate]     Pt states she couldn't think and it messed her head up  . Statins Other (See Comments)    Gradually tired with daily Lipitor, made pt feel bad (Pt can take Crestor 5 mg 4 times per week and Zetia 5 mg every other day)    Social History:  The patient  reports that she has been smoking Cigarettes.  She has never used smokeless tobacco. She reports that she does not drink alcohol or use drugs.   ROS:  Please see the history of present  illness.   All other systems reviewed and negative.   Vitals:   10/28/16 1038  BP: (!) 106/50  Pulse: 80  SpO2: 98%  Weight: 57.9 kg (127 lb 9.6 oz)  Height: 5\' 4"  (1.626 m)    PHYSICAL EXAM:  Well nourished, well developed, in no acute distress  HEENT: normal  Neck: no JVD. + bilateral L>R carotid bruit Cardiac:  normal S1, S2; brady regular; 1/6 systolic murmur LLSB  Lungs:  Decreased  bilateral breath sounds, no wheezing Abd: soft, nontender, non-distended,  no hepatomegaly  Ext: no edema, cyanosis or clubbing. Diminished DP pulses. R>L  Skin: warm and dry  Neuro:  CNs 2-12 intact, no focal abnormalities noted  ASSESSMENT AND PLAN:  1. CAD s/p previous anterior MI:  Cath 8/15 nonobstructive CAD. Brief [facility] on imdur will call in SL nitro if continues will need right and left cath 2.  Chronic Systolic CHF:  EF 31-49% echo 09/17/16  due to ischemic cardiomyopathy with mild RV dysfunction s/p Medtronic ICD.  - NYHA II-early III  - Optivol increased volume starting in October  - increase entresto to 49/51 mg   - Continue spironolactone at current dose.  - Does not tolerate beta blockers. - f/u with CHF clinic   3. COPD with ongoing tobacco abuse: strongly encouraged her to quit smoking.  4. PAD with Intermittent claudication: Continue medical therapy 5. Bilateral carotid stenosis moderate f/u duplex 08/2017  6. H/o symptomatic bradycardia: HR ok off BBs.  7. HTN: BP controlled.  8. Hyperlipidemia with intolerance of multiple cholesterol meds: Tolerating Crestor 5 mg four times/week and Zetia two times a week.   Lab Results  Component Value Date   LDLCALC 77 08/04/2016     Verlin Dike 10:55 AM

## 2016-10-27 ENCOUNTER — Ambulatory Visit (INDEPENDENT_AMBULATORY_CARE_PROVIDER_SITE_OTHER): Payer: Medicare Other

## 2016-10-27 ENCOUNTER — Telehealth: Payer: Self-pay

## 2016-10-27 DIAGNOSIS — Z95810 Presence of automatic (implantable) cardiac defibrillator: Secondary | ICD-10-CM

## 2016-10-27 DIAGNOSIS — I5022 Chronic systolic (congestive) heart failure: Secondary | ICD-10-CM

## 2016-10-27 NOTE — Progress Notes (Signed)
EPIC Encounter for ICM Monitoring  Patient Name: [name] is a 65 y.o. female Date: 10/27/2016 Primary Care Physican: [place] Primary Cardiologist: Nishan/Bensimhon Electrophysiologist: Allred Dry Weight:  unknown       Attempted ICM call and unable to reach.  Transmission reviewed.   Thoracic impedance abnormal suggesting fluid accumulation and fluid index is > threshold despite increase in Furosemide.   Patient has been accumulating fluid since a decrease in Furosemide dosage to 40 mg bid on 10/4 but she titrated her self up to 60 mg am and 40 mg pm and per 11/1 HF pharmacy note she will continue on this dosage.    Labs: 10/07/2016 Creatinine 1.23, BUN 16, Potassium 4.2, Sodium 136, eGFR 44-51 08/04/2016 Creatinine 1.15, BUN 14, Potassium 4.6, Sodium 136 09/10/2015 Creatinine 1.06, BUN 16, Potassium 4.4, Sodium 136 02/27/2015 Creatinine 1.13, BUN 18, Potassium 3.9, Sodium 135  Recommendations: NONE-Unable to reach  Follow-up plan: ICM clinic phone appointment on 11/27/2016.   Office appointment with Dr Johnsie Cancel on 10/27/2016  Copy of ICM check sent to primary cardiologist and device physician.   ICM trend: 10/27/2016             Rosalene Billings, RN 10/27/2016 11:16 AM

## 2016-10-27 NOTE — Telephone Encounter (Signed)
Remote ICM transmission received.  Attempted patient call and no answer 

## 2016-10-28 ENCOUNTER — Ambulatory Visit (INDEPENDENT_AMBULATORY_CARE_PROVIDER_SITE_OTHER): Payer: Medicare Other | Admitting: Cardiovascular Disease

## 2016-10-28 ENCOUNTER — Encounter: Payer: Self-pay | Admitting: Cardiology

## 2016-10-28 ENCOUNTER — Encounter: Payer: Self-pay | Admitting: Cardiovascular Disease

## 2016-10-28 VITALS — BP 106/50 | HR 80 | Ht 64.0 in | Wt 127.6 lb

## 2016-10-28 DIAGNOSIS — I255 Ischemic cardiomyopathy: Secondary | ICD-10-CM

## 2016-10-28 DIAGNOSIS — I5022 Chronic systolic (congestive) heart failure: Secondary | ICD-10-CM

## 2016-10-28 MED ORDER — NITROGLYCERIN 0.4 MG SL SUBL: 0.4000 mg | SUBLINGUAL_TABLET | SUBLINGUAL | 3 refills | Status: AC | PRN

## 2016-10-28 MED ORDER — SACUBITRIL-VALSARTAN 49-51 MG PO TABS: 1.0000 | ORAL_TABLET | Freq: Two times a day (BID) | ORAL | 11 refills | Status: DC

## 2016-10-28 NOTE — Patient Instructions (Signed)
Medication Instructions:  1) INCREASE ENTRESTO to 49-51 twice daily  Labwork: None  Testing/Procedures: None  Follow-Up: Your physician recommends that you schedule a follow-up appointment in the [place] in 2-3 weeks.  Your physician recommends that you schedule a follow-up appointment in 3 MONTHS with Dr. Johnsie Cancel.  Any Other Special Instructions Will Be Listed Below (If Applicable).     If you need a refill on your cardiac medications before your next appointment, please call your pharmacy.

## 2016-10-31 LAB — CUP PACEART REMOTE DEVICE CHECK
Battery Voltage: 3 V
Date Time Interrogation Session: 20171116083426
HIGH POWER IMPEDANCE MEASURED VALUE: 82 Ohm
Implantable Lead Implant Date: 20100212
Implantable Lead Location: 753860
Implantable Lead Model: 6935
Implantable Pulse Generator Implant Date: 20100212
Lead Channel Impedance Value: 632 Ohm
Lead Channel Sensing Intrinsic Amplitude: 19.326 mV
Lead Channel Setting Sensing Sensitivity: 0.3 mV
MDC IDC SET LEADCHNL RV PACING AMPLITUDE: 2.5 V
MDC IDC SET LEADCHNL RV PACING PULSEWIDTH: 0.4 ms
MDC IDC STAT BRADY RV PERCENT PACED: 0 %

## 2016-11-10 ENCOUNTER — Other Ambulatory Visit: Payer: Self-pay | Admitting: Internal Medicine

## 2016-11-11 ENCOUNTER — Encounter (HOSPITAL_COMMUNITY): Payer: Medicare Other

## 2016-11-11 NOTE — Progress Notes (Signed)
[facility] PREP Weekly Session   Patient Details  Name: [name] MRN: [id number] Date of Birth: 1950/12/23 Age: 65 y.o. PCP: [place]:   11/11/16 1326  Weight: 126 lb (57.2 kg)        [facility] Weekly seesion - 11/11/16 1300      Weekly Session   Topic Discussed Restaurant Eating   Minutes exercised this week 55 minutes  25 cardio/30 strength   Classes attended to date 5   Comments "grateful for family, God, children, grandkids"       [name] 11/11/2016, 1:27 PM

## 2016-11-18 NOTE — Progress Notes (Signed)
[facility] PREP Weekly Session   Patient Details  Name: [name] MRN: [id number] Date of Birth: Feb 23, 1951 Age: 65 y.o. PCP: [place]:   11/18/16 1251  Weight: 127 lb 6.4 oz (57.8 kg)        [facility] Weekly seesion - 11/18/16 1200      Weekly Session   Topic Discussed Stress management and problem solving   Minutes exercised this week 60 minutes  40cardio/20strength   Classes attended to date 6   Comments "grateful for grandkids, kids, family, God     Per [name]'s personal trainer, "she is doing well, has great balance, and is here a lot to workout"  [name] 11/18/2016, 12:53 PM

## 2016-11-20 ENCOUNTER — Ambulatory Visit (HOSPITAL_COMMUNITY)
Admission: RE | Admit: 2016-11-20 | Discharge: 2016-11-20 | Disposition: A | Discharge status: Home / Self Care | Payer: Medicare Other | Source: Ambulatory Visit | Attending: Cardiology | Admitting: Cardiology

## 2016-11-20 VITALS — BP 124/58 | HR 97 | Wt 125.8 lb

## 2016-11-20 DIAGNOSIS — I5040 Unspecified combined systolic (congestive) and diastolic (congestive) heart failure: Secondary | ICD-10-CM | POA: Diagnosis not present

## 2016-11-20 DIAGNOSIS — I10 Essential (primary) hypertension: Secondary | ICD-10-CM

## 2016-11-20 DIAGNOSIS — I2510 Atherosclerotic heart disease of native coronary artery without angina pectoris: Secondary | ICD-10-CM | POA: Insufficient documentation

## 2016-11-20 DIAGNOSIS — F419 Anxiety disorder, unspecified: Secondary | ICD-10-CM | POA: Insufficient documentation

## 2016-11-20 DIAGNOSIS — I6523 Occlusion and stenosis of bilateral carotid arteries: Secondary | ICD-10-CM | POA: Diagnosis not present

## 2016-11-20 DIAGNOSIS — Z95810 Presence of automatic (implantable) cardiac defibrillator: Secondary | ICD-10-CM | POA: Insufficient documentation

## 2016-11-20 DIAGNOSIS — I739 Peripheral vascular disease, unspecified: Secondary | ICD-10-CM | POA: Diagnosis not present

## 2016-11-20 DIAGNOSIS — I5022 Chronic systolic (congestive) heart failure: Secondary | ICD-10-CM | POA: Insufficient documentation

## 2016-11-20 DIAGNOSIS — E785 Hyperlipidemia, unspecified: Secondary | ICD-10-CM | POA: Insufficient documentation

## 2016-11-20 DIAGNOSIS — F17210 Nicotine dependence, cigarettes, uncomplicated: Secondary | ICD-10-CM | POA: Insufficient documentation

## 2016-11-20 DIAGNOSIS — Z7982 Long term (current) use of aspirin: Secondary | ICD-10-CM | POA: Insufficient documentation

## 2016-11-20 DIAGNOSIS — F329 Major depressive disorder, single episode, unspecified: Secondary | ICD-10-CM | POA: Insufficient documentation

## 2016-11-20 DIAGNOSIS — I252 Old myocardial infarction: Secondary | ICD-10-CM | POA: Diagnosis not present

## 2016-11-20 DIAGNOSIS — Z79899 Other long term (current) drug therapy: Secondary | ICD-10-CM | POA: Insufficient documentation

## 2016-11-20 DIAGNOSIS — R001 Bradycardia, unspecified: Secondary | ICD-10-CM | POA: Insufficient documentation

## 2016-11-20 DIAGNOSIS — I255 Ischemic cardiomyopathy: Secondary | ICD-10-CM | POA: Diagnosis not present

## 2016-11-20 DIAGNOSIS — I110 Hypertensive heart disease with heart failure: Secondary | ICD-10-CM | POA: Insufficient documentation

## 2016-11-20 DIAGNOSIS — J438 Other emphysema: Secondary | ICD-10-CM

## 2016-11-20 DIAGNOSIS — J449 Chronic obstructive pulmonary disease, unspecified: Secondary | ICD-10-CM | POA: Insufficient documentation

## 2016-11-20 LAB — BASIC METABOLIC PANEL
ANION GAP: 10 (ref 5–15)
BUN: 14 mg/dL (ref 6–20)
CHLORIDE: 102 mmol/L (ref 101–111)
CO2: 24 mmol/L (ref 22–32)
Calcium: 9.8 mg/dL (ref 8.9–10.3)
Creatinine, Ser: 1.24 mg/dL — ABNORMAL HIGH (ref 0.44–1.00)
GFR calc non Af Amer: 44 mL/min — ABNORMAL LOW (ref >60)
GFR, EST AFRICAN AMERICAN: 51 mL/min — AB (ref >60)
Glucose, Bld: 103 mg/dL — ABNORMAL HIGH (ref 65–99)
Potassium: 4.6 mmol/L (ref 3.5–5.1)
Sodium: 136 mmol/L (ref 135–145)

## 2016-11-20 LAB — BRAIN NATRIURETIC PEPTIDE: B Natriuretic Peptide: 225.7 pg/mL — ABNORMAL HIGH (ref 0.0–100.0)

## 2016-11-20 MED ORDER — FUROSEMIDE 20 MG PO TABS: 40.0000 mg | ORAL_TABLET | Freq: Two times a day (BID) | ORAL | 3 refills | Status: DC

## 2016-11-20 NOTE — Progress Notes (Signed)
Advanced Heart Failure Note   Patient ID: [name], female   DOB: 08/10/1951, 65 y.o.   MRN: [id number]  Date:  11/20/2016   ID:  [name], DOB Jun 10, 1951, MRN [id number]  PCP:  [name] in [place] Electrophysiologist:  Dr. Thompson Grayer  General Cardiologist: Dr Johnsie Cancel HF: Dr Haroldine Laws   History of Present Illness: [name] is a 66 y.o. female who was referred to the HF Clinic for evaluation for advanced therapies.   She has a hx of CAD, s/p prior Ant MI, s/p prior POBA to LAD, Dx, RCA, CHF due to ICM with high scar burden by MRI (12/09), s/p ICD, HTN, HL, depression, tobacco abuse.   ABI 7/14 R 0.93 L 0.95 MRI (12/09):  High scar burden, Inf and Apical AK, EF 24%.  Echo 10/14 EF 15% global HK. RV mild HK  Mod MR. Severe TR. PA 42mmHG   Lexiscan Myoview (11/14): LV Ejection Fraction: 24%. Extensive scar in the inferior wall, apex and anterior wall. Minimal reversibility Carotid Doppler : August 2015 showed 60-79% bilateral ICA stenosis. No previous CVA. Cath 07/26/14 Left main: Calcified 40% mid to distal stenosis LAD: Long vessel wrapping the apex 20-30% plaque in midsection. Distal vessel is small.  LCX: Large OM-1 and large OM-2. 50% lesion in proximal OM-2 RCA: Dominant vessel. Calcified, eccentric 50-60% lesion in midsection . LV-gram done in the RAO projection: Ejection fraction = 25% Severe global HK of mid to distal LV. No MR.    CPX 11/14 FVC 2.08 (67%)  FEV1 1.58 (66%)  FEV1/FVC 76%  Resting HR: 84 Peak HR: 142 (91% age predicted max HR) BP rest: 114/60 BP peak: 148/70 (IPE) Peak VO2: 15.0 (63% predicted peak VO2) VE/VCO2 slope: 32 OUES: 0.85 Peak RER: 1.33 Ventilatory Threshold: 12.1 (50.8% predicted peak VO2) VE/MVV: 60.1% O2pulse: 6 (75% predicted O2pulse)  She presents today for follow up.Was switched to Entresto at last visit, Lasix titrated down.  Has since had Entresto titrated up. Going to [employer] HF program. Did have  some mild chest heaviness while running on treadmill, switched to Bike without any difficulty.  Does have occasional chest heaviness along with her SOB.  States she was mildly SOB this am. Has good and bad days. States she was mildly SOB from elevator to front desk. No SOB with ADLs on good days.  Weight at home 123 - 125. Continues to smoke 6 cigarettes daily.   ICD interrogated. Fluid index below threshold with mild uptrend. Thoracic impedence mildly depressed. Pt activity 4-5 hours daily. No VT/AF.   Echo 09/17/16 :LVEF 25-30%, Grade 1  Labs (10/12):  K 3.2, creatinine 0.9, Hgb 13.5 Labs (9/14):    K 3.4, creatinine 1.0, pro BNP 1560, Hgb 11.8, HDL 25, LDL 60, ALT 29 Labs (10/14):  K 3.3=> 4.2, creatinine 1.2, BNP 1175 Labs (10/02/13) K 3.2 creatinine 0.9 Labs (12/13/13): LDL 134, AST 24, ALT 24, TC 221, TG 104 Labs (8/15): K 5.1, creatinine 1.1  Labs (11/15): LDL 79, HDL 67 Labs (3/16): K 3.9, creatinine 1.13  Wt Readings from Last 3 Encounters:  11/20/16 125 lb 12.8 oz (57.1 kg)  11/18/16 127 lb 6.4 oz (57.8 kg)  11/11/16 126 lb (57.2 kg)     Past Medical History:  Diagnosis Date  . Arthritis    hands  . Asthma   . CAD (coronary artery disease)    a. s/p prior Ant MI, b. s/p prior POBA to LAD, Dx, RCA,;  c.  LHC (10/12):  dLAD 40, pCFX 30, OM1 50, pRCA 30;  d.  Lexiscan Myoview (11/14):  High risk study; inferior, anterior, apical defects; minimal reversibility toward the apex; consistent with prior infarct and very mild peri-infarct ischemia, EF 24%, inferior/apical/anterior akinesis  . Chronic systolic heart failure ([place])   . Depression   . H/O hiatal hernia   . HLD (hyperlipidemia)   . HTN (hypertension)   . ICD (implantable cardiac defibrillator) in place   . Ischemic cardiomyopathy    MRI (12/09):  High scar burden, Inf and Apical AK, EF 24%.;  Echo (10/14): EF 15%, Gr 2 DD, mild to mod MR, mod LAE, RVSF mildly reduced, mod RAE, severe TR, PASP 49-53 (mod pulmonary HTN)  .  MI (myocardial infarction)   . Tobacco abuse     Current Outpatient Prescriptions  Medication Sig Dispense Refill  . acetaminophen (TYLENOL ARTHRITIS PAIN) 650 MG CR tablet Take 650 mg by mouth every 8 (eight) hours as needed for pain.    Marland Kitchen aspirin 81 MG tablet Take 81 mg by mouth daily.      Marland Kitchen azelastine (ASTELIN) 0.1 % nasal spray Place 1 spray into both nostrils daily. Use in each nostril as directed     . busPIRone (BUSPAR) 5 MG tablet Take 5 mg by mouth 2 (two) times daily as needed for anxiety.    Marland Kitchen ezetimibe (ZETIA) 10 MG tablet TAKE 1/2 TABLET TWICE A WEEK 5 tablet 7  . fexofenadine (ALLEGRA) 180 MG tablet Take 180 mg by mouth daily as needed for allergies.     . fish oil-omega-3 fatty acids 1000 MG capsule Take 2 g by mouth daily.     . fluticasone furoate-vilanterol (BREO ELLIPTA) 100-25 MCG/INH AEPB Inhale 1 puff into the lungs daily as needed for shortness of breath or wheezing.    . furosemide (LASIX) 20 MG tablet Take 40 mg by mouth 2 (two) times daily.    . isosorbide mononitrate (IMDUR) 30 MG 24 hr tablet Take 30 mg by mouth daily.    . NON FORMULARY OJC, organic juice cleanse dietary supplement, taking by mouth every other day    . pantoprazole (PROTONIX) 40 MG tablet Take 40 mg by mouth daily.    Marland Kitchen PARoxetine (PAXIL) 30 MG tablet Take 30 mg by mouth daily.     . polyethylene glycol (MIRALAX / GLYCOLAX) packet Take 17 g by mouth at bedtime.    . rosuvastatin (CRESTOR) 5 MG tablet Take 5 mg by mouth 3 (three) times a week.    . sacubitril-valsartan (ENTRESTO) 49-51 MG Take 1 tablet by mouth 2 (two) times daily. 60 tablet 11  . spironolactone (ALDACTONE) 25 MG tablet TAKE 1 TABLET BY MOUTH EVERY DAY 30 tablet 4  . nitroGLYCERIN (NITROSTAT) 0.4 MG SL tablet Place 1 tablet (0.4 mg total) under the tongue every 5 (five) minutes as needed for chest pain. (Patient not taking: Reported on 11/20/2016) 25 tablet 3   No current facility-administered medications for this encounter.      Allergies:    Allergies  Allergen Reactions  . Zebeta [Bisoprolol Fumarate]     Pt states she couldn't think and it messed her head up  . Statins Other (See Comments)    Gradually tired with daily Lipitor, made pt feel bad (Pt can take Crestor 5 mg 4 times per week and Zetia 5 mg every other day)    Social History:  The patient  reports that she has been smoking  Cigarettes.  She has never used smokeless tobacco. She reports that she does not drink alcohol or use drugs.   ROS:  Please see the history of present illness.   All other systems reviewed and negative.   Vitals:   11/20/16 1042  BP: (!) 124/58  BP Location: Left Arm  Patient Position: Sitting  Cuff Size: Normal  Pulse: 97  SpO2: 97%  Weight: 125 lb 12.8 oz (57.1 kg)   Wt Readings from Last 3 Encounters:  11/20/16 125 lb 12.8 oz (57.1 kg)  11/18/16 127 lb 6.4 oz (57.8 kg)  11/11/16 126 lb (57.2 kg)    PHYSICAL EXAM:  General: Anxious. Appears older than stated age. NAD.  Psych: Normal affect. HEENT: Normal, without mass or lesion. Neck: Supple, no bruits. JVP appears 6-7 cm at most. Carotids 2+. No lymphadenopathy/thyromegaly appreciated. Heart: PMI nondisplaced. RRR. 1/6 systolic murmur LLSB Lungs: Mildly decreased throughout.  Abdomen: Soft, non-tender, non-distended, No HSM, BS + x 4.  Extremities: No clubbing, cyanosis or edema. DP/PT/Radials 2+ and equal bilaterally. Neuro: Alert and oriented X 3. Moves all extremities spontaneously.    ASSESSMENT AND PLAN:  1. CAD s/p previous anterior MI:  Cath 8/15 nonobstructive CAD.   - No overt chest pain. Did have some mild discomfort on Treadmill associated with SOB. She continued to exercise on stationary bike, and had no further CP.  - Continue ASA and statin.  2.  Chronic Systolic CHF:  EF 29% (06/9891) due to ischemic cardiomyopathy with mild RV dysfunction s/p Medtronic ICD.  - NYHA III symptoms on some days.  - Volume status stable on exam.  Optivol with mild up-trend. ReDS Vest 26% - Continue lasix 40 mg daily. Can take extra 40 mg as needed.  - Continue entresto 49/51 mg BID - Continue spironolactone 25 mg daily.   - Does not tolerate beta blockers. - Encouraged to continue [employer] HF program.  3. COPD with ongoing tobacco abuse:  - Smoking 6 cigarettes a day at least. Strongly encouraged to quit smoking.   4. PAD with Intermittent claudication: Continue medical therapy 5. Bilateral carotid stenosis 6. H/o symptomatic bradycardia: HR ok off BBs.  7. HTN - Stable on current regimen. Pt resistant to med up-titration at this time.  8. Hyperlipidemia with intolerance of multiple cholesterol meds: Tolerating Crestor 5 mg four times/week and Zetia two times a week. Lipids stable in 07/2015 9. Depression/Anxiety - Think this could also be large contributor to her functional status.   Volume status stable on exam and ReDs vest. Optivol has recent sudden drop and mild uptrend. Can take extra lasix as needed. Needs to stop smoking. Follow up with MD 2 months.   Shirley Friar, PA-C 11:09 AM  Total time spent > 25 minutes. Over half that spent discussing the above.

## 2016-11-20 NOTE — Patient Instructions (Signed)
Routine lab work today. Will notify you of abnormal results, otherwise no news is good news!  Take 1 extra lasix tablet once daily AS NEEDED for weight gain 3 lbs or more overnight or 5 lbs or more in 1 week.  Follow up 2 months with Dr. Haroldine Laws.  Do the following things EVERYDAY: 1) Weigh yourself in the morning before breakfast. Write it down and keep it in a log. 2) Take your medicines as prescribed 3) Eat low salt foods-Limit salt (sodium) to 2000 mg per day.  4) Stay as active as you can everyday 5) Limit all fluids for the day to less than 2 liters

## 2016-11-20 NOTE — Progress Notes (Signed)
Advanced Heart Failure Medication Review by a Pharmacist  Does the patient  feel that his/her medications are working for him/her?  yes  Has the patient been experiencing any side effects to the medications prescribed?  no  Does the patient measure his/her own blood pressure or blood glucose at home?  yes   Does the patient have any problems obtaining medications due to transportation or finances?   no  Understanding of regimen: good Understanding of indications: good Potential of compliance: good Patient understands to avoid NSAIDs. Patient understands to avoid decongestants.  Issues to address at subsequent visits: None   Pharmacist comments:  [name] is a pleasant 65 yo F presenting without a medication list but with good recall of her regimen. She reports good compliance and did not have any specific medication-related questions or concerns for me at this time.   [name], PharmD, BCPS, CPP Clinical Pharmacist Pager: [phone] Phone: [phone] 11/20/2016 10:58 AM      Time with patient: 10 minutes Preparation and documentation time: 2 minutes Total time: 12 minutes

## 2016-11-25 NOTE — Progress Notes (Signed)
[facility] PREP Weekly Session   Patient Details  Name: [name] MRN: [id number] Date of Birth: 24-Oct-1951 Age: 65 y.o. PCP: [place]:   11/25/16 1347  Weight: 126 lb 9.6 oz (57.4 kg)        [facility] Weekly seesion - 11/25/16 1300      Weekly Session   Topic Discussed Importance of resistance training   Minutes exercised this week 60 minutes  cardio   Classes attended to date 7   Comments "grateful for grandkids, kids, sisters, God."     Barriers/struggles: "Watching my salt"   [name] 11/25/2016, 1:48 PM

## 2016-11-27 ENCOUNTER — Other Ambulatory Visit: Payer: Self-pay | Admitting: Internal Medicine

## 2016-11-27 NOTE — Progress Notes (Unsigned)
ICM transmission rescheduled from 11/27/2016 to 12/17/2016 due to patient was seen in HF clinic on 11/20/2016.

## 2016-12-09 NOTE — Progress Notes (Signed)
[facility] PREP Weekly Session   Patient Details  Name: [name] MRN: [id number] Date of Birth: 1951-04-04 Age: 66 y.o. PCP: [place]:   12/09/16 1404  Weight: 124 lb 8 oz (56.5 kg)        [facility] Weekly seesion - 12/09/16 1400      Weekly Session   Topic Discussed Expectations and non-scale victories   Minutes exercised this week 61 minutes  41 cardio/20 strength   Classes attended to date [place] 12/09/2016, 2:05 PM

## 2016-12-16 NOTE — Progress Notes (Signed)
[facility] PREP Weekly Session   Patient Details  Name: JOBY HERSHKOWITZ MRN: [id number] Date of Birth: [birth year] Age: 66 y.o. PCP: [place]:   12/16/16 1325  Weight: 128 lb 6.4 oz (58.2 kg)        [facility] Weekly seesion - 12/16/16 1300      Weekly Session   Topic Discussed Other  portion control   Minutes exercised this week 75 minutes  60 cardio/15 strength   Classes attended to date 9     Things grateful for: "God, family, friends"   [name] 12/16/2016, 1:26 PM

## 2016-12-17 ENCOUNTER — Telehealth: Payer: Self-pay | Admitting: Cardiology

## 2016-12-17 NOTE — Telephone Encounter (Signed)
LMOVM reminding pt to send remote transmission.   

## 2016-12-21 NOTE — Progress Notes (Unsigned)
No ICM remote transmission received on 12/17/2016.   Next ICM transmission scheduled for 02/16/2017.  Patient has follow office appointments, HF clinic 2/15, Defib check with [name], 2/19 and office visit with Dr Johnsie Cancel 2/22.

## 2016-12-31 ENCOUNTER — Encounter: Payer: Self-pay | Admitting: Nurse Practitioner

## 2017-01-15 ENCOUNTER — Encounter (HOSPITAL_COMMUNITY): Payer: Self-pay

## 2017-01-15 NOTE — Progress Notes (Signed)
[facility] PREP Weekly Session   Patient Details  Name: [name] MRN: [id number] Date of Birth: 1951/11/25 Age: 66 y.o. PCP: [place]:   01/15/17 0900  Weight: 129 lb 6.4 oz (58.7 kg)        [facility] Weekly seesion - 01/15/17 0900      Weekly Session   Topic Discussed Hitting roadblocks   Classes attended to date 32   Comments "grateful for friends, family, God"       [name] 01/15/2017, 9:20 AM

## 2017-01-18 NOTE — Progress Notes (Signed)
Patient ID: [name], female   DOB: Apr 23, 1951, 66 y.o.   MRN: [id number]  Date:  01/28/2017   ID:  [name], DOB 1951-09-05, MRN [id number]  PCP:  [name] in [place] Electrophysiologist:  Dr. Thompson Grayer  General Cardiologist: Dr Johnsie Cancel HF: Dr Haroldine Laws   History of Present Illness: MASSA PE is a 66 y.o. female with CHF.    She has a hx of CAD, s/p prior Ant MI, s/p prior POBA to LAD, Dx, RCA, CHF due to ICM with high scar burden by MRI (12/09), s/p ICD, HTN, HL, depression, tobacco abuse.   Last Echo 09/17/16 no change in EF  Study Conclusions  - Left ventricle: The cavity size was moderately dilated. Wall   thickness was normal. Systolic function was severely reduced. The   estimated ejection fraction was in the range of 25% to 30%.   Diffuse hypokinesis. Doppler parameters are consistent with   abnormal left ventricular relaxation (grade 1 diastolic   dysfunction). Doppler parameters are consistent with high   ventricular filling pressure. Internal dimension, ED (PLAX   chordal): 65.9 mm. - Aortic valve: Trileaflet; mildly thickened, mildly calcified   leaflets. - Mitral valve: Calcified annulus. There was moderate   regurgitation. - Left atrium: The atrium was mildly dilated. Anterior-posterior   dimension: 42 mm. - Right ventricle: Pacer wire or catheter noted in right ventricle. - Pulmonary arteries: Systolic pressure was mildly increased.  Impressions:  - Compared to the prior study, there has been no significant   interval change.  ABI 08/17/16 Right 1.0 left .70  Carotid Doppler : 08/17/16  Bilateral 40-59% ICA stenosis .  Cath 07/26/14 Left main: Calcified 40% mid to distal stenosis LAD: Long vessel wrapping the apex 20-30% plaque in midsection. Distal vessel is small.  LCX: Large OM-1 and large OM-2. 50% lesion in proximal OM-2 RCA: Dominant vessel. Calcified, eccentric 50-60% lesion in midsection . LV-gram done in  the RAO projection: Ejection fraction = 25% Severe global HK of mid to distal LV. No MR.    Fatigue with bisoprolol and coreg Tolerating nitrates. ICM monitoring with elevated Volume starting in October.  Feels bloated and weight up about 2 lbs Does not get LE edema accumulates fluid in abdomen Better on higher dose entresto   Lab Results  Component Value Date   CREATININE 1.24 (H) 11/20/2016   BUN 14 11/20/2016   NA 136 11/20/2016   K 4.6 11/20/2016   CL 102 11/20/2016   CO2 24 11/20/2016     Wt Readings from Last 3 Encounters:  01/28/17 128 lb 12.8 oz (58.4 kg)  01/22/17 127 lb (57.6 kg)  01/15/17 129 lb 6.4 oz (58.7 kg)     Past Medical History:  Diagnosis Date  . Arthritis    hands  . Asthma   . CAD (coronary artery disease)    a. s/p prior Ant MI, b. s/p prior POBA to LAD, Dx, RCA,;  c.  LHC (10/12):  dLAD 40, pCFX 30, OM1 50, pRCA 30;  d.  Carl[facility] Adam Myoview (11/14):  High risk study; inferior, anterior, apical defects; minimal reversibility toward the apex; consistent with prior infarct and very mild peri-infarct ischemia, EF 24%, inferior/apical/anterior akinesis  . Chronic systolic heart failure ([place])   . Depression   . H/O hiatal hernia   . HLD (hyperlipidemia)   . HTN (hypertension)   . Ischemic cardiomyopathy    MRI (12/09):  High scar burden, Inf and  Apical AK, EF 24%.;  Echo (10/14): EF 15%, Gr 2 DD, mild to mod MR, mod LAE, RVSF mildly reduced, mod RAE, severe TR, PASP 49-53 (mod pulmonary HTN)  . MI (myocardial infarction)   . Tobacco abuse     Current Outpatient Prescriptions  Medication Sig Dispense Refill  . acetaminophen (TYLENOL ARTHRITIS PAIN) 650 MG CR tablet Take 650 mg by mouth every 8 (eight) hours as needed for pain.    Marland Kitchen aspirin 81 MG tablet Take 81 mg by mouth daily.      Marland Kitchen azelastine (ASTELIN) 0.1 % nasal spray Place 1 spray into both nostrils daily. Use in each nostril as directed     . busPIRone (BUSPAR) 5 MG tablet Take 5 mg by mouth 2  (two) times daily as needed for anxiety.    Marland Kitchen ezetimibe (ZETIA) 10 MG tablet TAKE 1/2 TABLET TWICE A WEEK 5 tablet 7  . fexofenadine (ALLEGRA) 180 MG tablet Take 180 mg by mouth daily as needed for allergies.     . fish oil-omega-3 fatty acids 1000 MG capsule Take 2 g by mouth daily.     . fluticasone furoate-vilanterol (BREO ELLIPTA) 100-25 MCG/INH AEPB Inhale 1 puff into the lungs daily as needed for shortness of breath or wheezing.    . furosemide (LASIX) 20 MG tablet Take 2 tablets (40 mg total) by mouth 2 (two) times daily. Take 1 extra tablet once daily AS NEEDED for weight gain. 150 tablet 3  . furosemide (LASIX) 20 MG tablet Take 2 tablets (40 mg total) by mouth daily. May take any additional 2 tabs prn swelling/sob 120 tablet 3  . isosorbide mononitrate (IMDUR) 30 MG 24 hr tablet Take 30 mg by mouth daily.    . nitroGLYCERIN (NITROSTAT) 0.4 MG SL tablet Place 1 tablet (0.4 mg total) under the tongue every 5 (five) minutes as needed for chest pain. 25 tablet 3  . NON FORMULARY OJC, organic juice cleanse dietary supplement, taking by mouth every other day    . pantoprazole (PROTONIX) 40 MG tablet Take 40 mg by mouth daily.    Marland Kitchen PARoxetine (PAXIL) 30 MG tablet Take 30 mg by mouth daily.     . polyethylene glycol (MIRALAX / GLYCOLAX) packet Take 17 g by mouth at bedtime.    . rosuvastatin (CRESTOR) 5 MG tablet Take 5 mg by mouth 3 (three) times a week.    . sacubitril-valsartan (ENTRESTO) 49-51 MG Take 1 tablet by mouth 2 (two) times daily. 60 tablet 11  . spironolactone (ALDACTONE) 25 MG tablet TAKE 1 TABLET BY MOUTH EVERY DAY 30 tablet 4   No current facility-administered medications for this visit.     Allergies:    Allergies  Allergen Reactions  . Zebeta [Bisoprolol Fumarate]     Pt states she couldn't think and it messed her head up  . Statins Other (See Comments)    Gradually tired with daily Lipitor, made pt feel bad (Pt can take Crestor 5 mg 4 times per week and Zetia 5 mg  every other day)    Social History:  The patient  reports that she has been smoking Cigarettes.  She has never used smokeless tobacco. She reports that she does not drink alcohol or use drugs.   ROS:  Please see the history of present illness.   All other systems reviewed and negative.   ECG:  RR rate 82 PVC RAD pulmonary Dx pattern   Vitals:   01/28/17 1044  BP: (!) 104/52  Pulse: 79  SpO2: 98%  Weight: 128 lb 12.8 oz (58.4 kg)  Height: 5\' 3"  (1.6 m)    PHYSICAL EXAM:  Well nourished, well developed, in no acute distress  HEENT: normal  Neck: no JVD. + bilateral L>R carotid bruit Cardiac:  normal S1, S2; brady regular; 1/6 systolic murmur LLSB  Lungs:  Decreased bilateral breath sounds, no wheezing Abd: soft, nontender, non-distended,  no hepatomegaly  Ext: no edema, cyanosis or clubbing. Diminished DP pulses. R>L  Skin: warm and dry  Neuro:  CNs 2-12 intact, no focal abnormalities noted  ASSESSMENT AND PLAN:  1. CAD s/p previous anterior MI:  Cath 8/15 nonobstructive CAD. Continue medical Rx 2.  Chronic Systolic CHF:  EF 04-59% echo 09/17/16  due to ischemic cardiomyopathy with mild RV dysfunction s/p Medtronic ICD.  - NYHA II-early III Otivol back in normal range today reviewed with Amber   - continue higher dose entresto  - Continue spironolactone at current dose.  - Does not tolerate beta blockers. - f/u with CHF clinic in a month with DB   3. COPD with ongoing tobacco abuse: strongly encouraged her to quit smoking.  4. PAD with Intermittent claudication: Continue medical therapy will check ABI"s in  December  with carotids 5. Bilateral carotid stenosis moderate f/u duplex 11/2017  6. H/o symptomatic bradycardia: HR ok off BBs.  7. HTN: BP controlled.  8. Hyperlipidemia with intolerance of multiple cholesterol meds: Tolerating Crestor 5 mg four times/week and Zetia two times a week.   Lab Results  Component Value Date   LDLCALC 77 08/04/2016     Timur Nibert,MD 11:14 AM

## 2017-01-21 ENCOUNTER — Encounter (HOSPITAL_COMMUNITY): Payer: Medicare Other | Admitting: Internal Medicine

## 2017-01-22 NOTE — Progress Notes (Signed)
[facility] PREP Weekly Session   Patient Details  Name: [name] MRN: [id number] Date of Birth: 25-Oct-1951 Age: 66 y.o. PCP: [place]:   01/22/17 1137  Weight: 127 lb (57.6 kg)        [facility] Weekly seesion - 01/22/17 1100      Weekly Session   Topic Discussed Other   Classes attended to date 43     [place] Weekly Session   Patient Details  Name: [name] MRN: [id number] Date of Birth: [birth year] Age: 66 y.o. PCP: [place]:   01/22/17 1137  Weight: 127 lb (57.6 kg)        [facility] Weekly seesion - 01/22/17 1100      Weekly Session   Topic Discussed Other   Classes attended to date 12  guest speaker       [name] 01/22/2017, 11:38 AM   [name] 01/22/2017, 11:38 AM

## 2017-01-24 ENCOUNTER — Other Ambulatory Visit (HOSPITAL_COMMUNITY): Payer: Self-pay | Admitting: Internal Medicine

## 2017-01-25 ENCOUNTER — Encounter: Payer: Medicare Other | Admitting: Nurse Practitioner

## 2017-01-26 NOTE — Progress Notes (Signed)
Pt seen today by Dr Johnsie Cancel Device check only Normal device function No changes today Follow up with Karen Chafe, ICM clinic  [name], NP 01/28/2017 11:17 AM

## 2017-01-28 ENCOUNTER — Ambulatory Visit (INDEPENDENT_AMBULATORY_CARE_PROVIDER_SITE_OTHER): Payer: Medicare Other | Admitting: Cardiovascular Disease

## 2017-01-28 ENCOUNTER — Encounter: Payer: Self-pay | Admitting: Cardiovascular Disease

## 2017-01-28 ENCOUNTER — Ambulatory Visit (INDEPENDENT_AMBULATORY_CARE_PROVIDER_SITE_OTHER): Payer: Medicare Other | Admitting: Nurse Practitioner

## 2017-01-28 ENCOUNTER — Encounter: Payer: Medicare Other | Admitting: Nurse Practitioner

## 2017-01-28 ENCOUNTER — Encounter: Payer: Self-pay | Admitting: Nurse Practitioner

## 2017-01-28 VITALS — BP 104/52 | HR 79 | Ht 63.0 in | Wt 128.8 lb

## 2017-01-28 DIAGNOSIS — I5040 Unspecified combined systolic (congestive) and diastolic (congestive) heart failure: Secondary | ICD-10-CM | POA: Diagnosis not present

## 2017-01-28 DIAGNOSIS — I6523 Occlusion and stenosis of bilateral carotid arteries: Secondary | ICD-10-CM

## 2017-01-28 DIAGNOSIS — I739 Peripheral vascular disease, unspecified: Secondary | ICD-10-CM

## 2017-01-28 DIAGNOSIS — I5022 Chronic systolic (congestive) heart failure: Secondary | ICD-10-CM

## 2017-01-28 DIAGNOSIS — Z720 Tobacco use: Secondary | ICD-10-CM

## 2017-01-28 LAB — CUP PACEART INCLINIC DEVICE CHECK
Date Time Interrogation Session: 20180222111851
Implantable Lead Implant Date: 20100212
Implantable Lead Model: 6935
Implantable Pulse Generator Implant Date: 20100212
MDC IDC LEAD LOCATION: 753860

## 2017-01-28 NOTE — Patient Instructions (Addendum)
Medication Instructions:  Your physician recommends that you continue on your current medications as directed. Please refer to the Current Medication list given to you today.  Labwork: NONE  Testing/Procedures: Your physician has requested that you have a carotid duplex in September. This test is an ultrasound of the carotid arteries in your neck. It looks at blood flow through these arteries that supply the brain with blood. Allow one hour for this exam. There are no restrictions or special instructions.  Your physician has requested that you have a lower extremity arterial duplex with ABIs in September. During this test  ultrasound are used to evaluate arterial blood flow in the legs. Allow one hour for this exam. There are no restrictions or special instructions.   Follow-Up: Your physician wants you to follow-up in: 6 months with Dr. Johnsie Cancel. You will receive a reminder letter in the mail two months in advance. If you don't receive a letter, please call our office to schedule the follow-up appointment.   If you need a refill on your cardiac medications before your next appointment, please call your pharmacy.

## 2017-01-28 NOTE — Patient Instructions (Signed)
Medication Instructions:   Your physician recommends that you continue on your current medications as directed. Please refer to the Current Medication list given to you today.    If you need a refill on your cardiac medications before your next appointment, please call your pharmacy.  Labwork: NONE ORDERED  TODAY    Testing/Procedures: NONE ORDERED  TODAY    Follow-Up: Your physician wants you to follow-up in: [place] will receive a reminder letter in the mail two months in advance. If you don't receive a letter, please call our office to schedule the follow-up appointment.  Remote monitoring is used to monitor your Pacemaker of ICD from home. This monitoring reduces the number of office visits required to check your device to one time per year. It allows [place] to keep an eye on the functioning of your device to ensure it is working properly. You are scheduled for a device check from home on .5/23/2018You may send your transmission at any time that day. If you have a wireless device, the transmission will be sent automatically. After your physician reviews your transmission, you will receive a postcard with your next transmission date.     Any Other Special Instructions Will Be Listed Below (If Applicable).

## 2017-02-04 ENCOUNTER — Other Ambulatory Visit (HOSPITAL_COMMUNITY): Payer: Self-pay | Admitting: Internal Medicine

## 2017-02-16 ENCOUNTER — Telehealth: Payer: Self-pay | Admitting: Cardiology

## 2017-02-16 ENCOUNTER — Ambulatory Visit (INDEPENDENT_AMBULATORY_CARE_PROVIDER_SITE_OTHER): Payer: Medicare Other

## 2017-02-16 DIAGNOSIS — Z95810 Presence of automatic (implantable) cardiac defibrillator: Secondary | ICD-10-CM

## 2017-02-16 DIAGNOSIS — I5022 Chronic systolic (congestive) heart failure: Secondary | ICD-10-CM | POA: Diagnosis not present

## 2017-02-16 NOTE — Telephone Encounter (Signed)
Spoke with pt and reminded pt of remote transmission that is due today. Pt verbalized understanding.   

## 2017-02-16 NOTE — Progress Notes (Signed)
EPIC Encounter for ICM Monitoring  Patient Name: [name] is a 66 y.o. female Date: 02/16/2017 Primary Care Physican: [place] Primary Cardiologist: Nishan/Bensimhon Electrophysiologist: Allred Dry Weight:     unknown      Transmission reviewed   Thoracic impedance normal.  Prescribed dosage: Furosemide 2 tablets (40 mg total) by mouth daily. May take any additional 2 tabs prn swelling/sob  Labs: 11/20/2016 Creatinine 1.24, BUN 14, Potassium 4.6, Sodium 136, EGFR 44-51 10/07/2016 Creatinine 1.23, BUN 16, Potassium 4.2, Sodium 136, eGFR 44-51 08/04/2016 Creatinine 1.15, BUN 14, Potassium 4.6, Sodium 136 09/10/2015 Creatinine 1.06, BUN 16, Potassium 4.4, Sodium 136 02/27/2015 Creatinine 1.13, BUN 18, Potassium 3.9, Sodium 135  Recommendations: None  Follow-up plan: ICM clinic phone appointment on 03/22/2017.  Copy of ICM check sent to device physician.   3 month ICM trend: 02/16/2017   1 Year ICM trend:      Rosalene Billings, RN 02/16/2017 5:15 PM

## 2017-02-26 ENCOUNTER — Encounter (HOSPITAL_COMMUNITY): Payer: Self-pay | Admitting: Internal Medicine

## 2017-02-26 ENCOUNTER — Ambulatory Visit (HOSPITAL_COMMUNITY)
Admission: RE | Admit: 2017-02-26 | Discharge: 2017-02-26 | Disposition: A | Discharge status: Home / Self Care | Payer: Medicare Other | Source: Ambulatory Visit | Attending: Internal Medicine | Admitting: Internal Medicine

## 2017-02-26 VITALS — BP 100/52 | HR 89 | Wt 126.4 lb

## 2017-02-26 DIAGNOSIS — I739 Peripheral vascular disease, unspecified: Secondary | ICD-10-CM | POA: Diagnosis not present

## 2017-02-26 DIAGNOSIS — I6523 Occlusion and stenosis of bilateral carotid arteries: Secondary | ICD-10-CM | POA: Diagnosis not present

## 2017-02-26 DIAGNOSIS — E785 Hyperlipidemia, unspecified: Secondary | ICD-10-CM | POA: Diagnosis not present

## 2017-02-26 DIAGNOSIS — F419 Anxiety disorder, unspecified: Secondary | ICD-10-CM | POA: Insufficient documentation

## 2017-02-26 DIAGNOSIS — F329 Major depressive disorder, single episode, unspecified: Secondary | ICD-10-CM | POA: Insufficient documentation

## 2017-02-26 DIAGNOSIS — Z9889 Other specified postprocedural states: Secondary | ICD-10-CM | POA: Insufficient documentation

## 2017-02-26 DIAGNOSIS — I5022 Chronic systolic (congestive) heart failure: Secondary | ICD-10-CM | POA: Insufficient documentation

## 2017-02-26 DIAGNOSIS — I252 Old myocardial infarction: Secondary | ICD-10-CM | POA: Insufficient documentation

## 2017-02-26 DIAGNOSIS — I272 Pulmonary hypertension, unspecified: Secondary | ICD-10-CM | POA: Diagnosis not present

## 2017-02-26 DIAGNOSIS — I2510 Atherosclerotic heart disease of native coronary artery without angina pectoris: Secondary | ICD-10-CM | POA: Diagnosis not present

## 2017-02-26 DIAGNOSIS — Z888 Allergy status to other drugs, medicaments and biological substances status: Secondary | ICD-10-CM | POA: Insufficient documentation

## 2017-02-26 DIAGNOSIS — I255 Ischemic cardiomyopathy: Secondary | ICD-10-CM | POA: Diagnosis not present

## 2017-02-26 DIAGNOSIS — J449 Chronic obstructive pulmonary disease, unspecified: Secondary | ICD-10-CM | POA: Insufficient documentation

## 2017-02-26 DIAGNOSIS — Z95810 Presence of automatic (implantable) cardiac defibrillator: Secondary | ICD-10-CM | POA: Insufficient documentation

## 2017-02-26 DIAGNOSIS — I110 Hypertensive heart disease with heart failure: Secondary | ICD-10-CM | POA: Insufficient documentation

## 2017-02-26 DIAGNOSIS — Z7982 Long term (current) use of aspirin: Secondary | ICD-10-CM | POA: Diagnosis not present

## 2017-02-26 DIAGNOSIS — F17210 Nicotine dependence, cigarettes, uncomplicated: Secondary | ICD-10-CM | POA: Insufficient documentation

## 2017-02-26 MED ORDER — FUROSEMIDE 20 MG PO TABS: 40.0000 mg | ORAL_TABLET | Freq: Every day | ORAL | 3 refills | Status: DC

## 2017-02-26 NOTE — Progress Notes (Signed)
Advanced Heart Failure Note   Patient ID: [name], female   DOB: Oct 30, 1951, 66 y.o.   MRN: [id number]  Date:  02/26/2017   ID:  [name], DOB 1951/08/17, MRN [id number]  PCP:  [name] in [place] Electrophysiologist:  Dr. Thompson Grayer  General Cardiologist: Dr Johnsie Cancel HF: Dr Haroldine Laws   History of Present Illness: [name] is a 66 y.o. female who was referred to the HF Clinic for evaluation for advanced therapies.   She has a hx of CAD, s/p prior Ant MI, s/p prior POBA to LAD, Dx, RCA, CHF due to ICM with high scar burden by MRI (12/09), s/p ICD, HTN, HL, depression, tobacco abuse.   ABI 7/14 R 0.93 L 0.95 MRI (12/09):  High scar burden, Inf and Apical AK, EF 24%.  Echo 10/14 EF 15% global HK. RV mild HK  Mod MR. Severe TR. PA 23mmHG   Lexiscan Myoview (11/14): LV Ejection Fraction: 24%. Extensive scar in the inferior wall, apex and anterior wall. Minimal reversibility Carotid Doppler : August 2015 showed 60-79% bilateral ICA stenosis. No previous CVA. Cath 07/26/14 Left main: Calcified 40% mid to distal stenosis LAD: Long vessel wrapping the apex 20-30% plaque in midsection. Distal vessel is small.  LCX: Large OM-1 and large OM-2. 50% lesion in proximal OM-2 RCA: Dominant vessel. Calcified, eccentric 50-60% lesion in midsection . LV-gram done in the RAO projection: Ejection fraction = 25% Severe global HK of mid to distal LV. No MR.    CPX 11/14 FVC 2.08 (67%)  FEV1 1.58 (66%)  FEV1/FVC 76%  Resting HR: 84 Peak HR: 142 (91% age predicted max HR) BP rest: 114/60 BP peak: 148/70 (IPE) Peak VO2: 15.0 (63% predicted peak VO2) VE/VCO2 slope: 32 OUES: 0.85 Peak RER: 1.33 Ventilatory Threshold: 12.1 (50.8% predicted peak VO2) VE/MVV: 60.1% O2pulse: 6 (75% predicted O2pulse)  She returns today for HF follow up. Weights stable, 122-125 pounds. Taking all medications with compliance. Had a recent bout of bronchitis. Treated with z-pack by PCP.  Still feeling weak. No fever. Had chills a few days ago. No wheezing. Using inhaler. No SOB with stairs, can walk around the grocery store without SOB. Eating a low salt diet, will occasionally eat a ham biscuit. Drinking less than 2 L a day. No ETOH. Smokes 6 cigarettes a day. Sleeps with one pillow, no PND. Hasn't been to the [name] to work out in a month due to the bronchitis. Will start back soon. Is feeling more dizzy lately. No edema or orthopnea. + dizzy when standing. No syncope/palpitations.    Echo 09/17/16 :LVEF 25-30%, Grade 1  Labs (10/12):  K 3.2, creatinine 0.9, Hgb 13.5 Labs (9/14):    K 3.4, creatinine 1.0, pro BNP 1560, Hgb 11.8, HDL 25, LDL 60, ALT 29 Labs (10/14):  K 3.3=> 4.2, creatinine 1.2, BNP 1175 Labs (10/02/13) K 3.2 creatinine 0.9 Labs (12/13/13): LDL 134, AST 24, ALT 24, TC 221, TG 104 Labs (8/15): K 5.1, creatinine 1.1  Labs (11/15): LDL 79, HDL 67 Labs (3/16): K 3.9, creatinine 1.13  Wt Readings from Last 3 Encounters:  02/26/17 126 lb 6.4 oz (57.3 kg)  01/28/17 128 lb 12.8 oz (58.4 kg)  01/22/17 127 lb (57.6 kg)     Past Medical History:  Diagnosis Date  . Arthritis    hands  . Asthma   . CAD (coronary artery disease)    a. s/p prior Ant MI, b. s/p prior POBA to LAD,  Dx, RCA,;  c.  LHC (10/12):  dLAD 40, pCFX 30, OM1 50, pRCA 30;  d.  Carl[facility] Adam Myoview (11/14):  High risk study; inferior, anterior, apical defects; minimal reversibility toward the apex; consistent with prior infarct and very mild peri-infarct ischemia, EF 24%, inferior/apical/anterior akinesis  . Chronic systolic heart failure ([place])   . Depression   . H/O hiatal hernia   . HLD (hyperlipidemia)   . HTN (hypertension)   . Ischemic cardiomyopathy    MRI (12/09):  High scar burden, Inf and Apical AK, EF 24%.;  Echo (10/14): EF 15%, Gr 2 DD, mild to mod MR, mod LAE, RVSF mildly reduced, mod RAE, severe TR, PASP 49-53 (mod pulmonary HTN)  . MI (myocardial infarction)   . Tobacco abuse      Current Outpatient Prescriptions  Medication Sig Dispense Refill  . acetaminophen (TYLENOL ARTHRITIS PAIN) 650 MG CR tablet Take 650 mg by mouth every 8 (eight) hours as needed for pain.    Marland Kitchen aspirin 81 MG tablet Take 81 mg by mouth daily.      Marland Kitchen azelastine (ASTELIN) 0.1 % nasal spray Place 1 spray into both nostrils daily. Use in each nostril as directed     . busPIRone (BUSPAR) 5 MG tablet Take 5 mg by mouth 2 (two) times daily as needed for anxiety.    Marland Kitchen ezetimibe (ZETIA) 10 MG tablet TAKE 1/2 TABLET TWICE A WEEK 5 tablet 7  . fexofenadine (ALLEGRA) 180 MG tablet Take 180 mg by mouth daily as needed for allergies.     . fish oil-omega-3 fatty acids 1000 MG capsule Take 2 g by mouth daily.     . fluticasone furoate-vilanterol (BREO ELLIPTA) 100-25 MCG/INH AEPB Inhale 1 puff into the lungs daily as needed for shortness of breath or wheezing.    . furosemide (LASIX) 20 MG tablet Take 2 tablets (40 mg total) by mouth 2 (two) times daily. Take 1 extra tablet once daily AS NEEDED for weight gain. 150 tablet 3  . isosorbide mononitrate (IMDUR) 30 MG 24 hr tablet Take 30 mg by mouth daily.    . nitroGLYCERIN (NITROSTAT) 0.4 MG SL tablet Place 1 tablet (0.4 mg total) under the tongue every 5 (five) minutes as needed for chest pain. 25 tablet 3  . NON FORMULARY OJC, organic juice cleanse dietary supplement, taking by mouth every other day    . pantoprazole (PROTONIX) 40 MG tablet Take 40 mg by mouth daily.    Marland Kitchen PARoxetine (PAXIL) 30 MG tablet Take 30 mg by mouth daily.     . polyethylene glycol (MIRALAX / GLYCOLAX) packet Take 17 g by mouth at bedtime.    . rosuvastatin (CRESTOR) 5 MG tablet Take 5 mg by mouth 3 (three) times a week.    . sacubitril-valsartan (ENTRESTO) 49-51 MG Take 1 tablet by mouth 2 (two) times daily. 60 tablet 11  . spironolactone (ALDACTONE) 25 MG tablet TAKE 1 TABLET BY MOUTH EVERY DAY 30 tablet 4   No current facility-administered medications for this encounter.      Allergies:    Allergies  Allergen Reactions  . Zebeta [Bisoprolol Fumarate]     Pt states she couldn't think and it messed her head up  . Statins Other (See Comments)    Gradually tired with daily Lipitor, made pt feel bad (Pt can take Crestor 5 mg 4 times per week and Zetia 5 mg every other day)    Social History:  The patient  reports that she  has been smoking Cigarettes.  She has never used smokeless tobacco. She reports that she does not drink alcohol or use drugs.   ROS:  Please see the history of present illness.   All other systems reviewed and negative.   Vitals:   02/26/17 1146  BP: (!) 100/52  Pulse: 89  SpO2: 98%  Weight: 126 lb 6.4 oz (57.3 kg)   Wt Readings from Last 3 Encounters:  02/26/17 126 lb 6.4 oz (57.3 kg)  01/28/17 128 lb 12.8 oz (58.4 kg)  01/22/17 127 lb (57.6 kg)    PHYSICAL EXAM:  General: Elderly female. NAD. No resp difficulty  Psych: Normal affect. HEENT: Normal,atraumatic Neck: Supple, no bruits. No JVP  Carotids 2+. No lymphadenopathy/thyromegaly appreciated. Heart: PMI nondisplaced. RRR. No murmurs.  Lungs: Diminished in bases.  Abdomen: Soft, non-tender, non-distended, No HSM, BS + x 4.  Extremities: No clubbing, cyanosis or edema. DP/PT/Radials 2+ and equal bilaterally. Neuro: Alert and oriented X 3. Moves all extremities spontaneously.    ASSESSMENT AND PLAN:  1. CAD s/p previous anterior MI:  Cath 8/15 nonobstructive CAD.   -  No chest pain  - Continue ASA and statin.  2.  Chronic Systolic CHF:  EF 84% (05/6598) due to ischemic cardiomyopathy with mild RV dysfunction s/p Medtronic ICD. Ech 10/17 EF 25-30% - NYHA II  - Orthostatic vitals : lying 103/46 Standing 88/39 - Optivol flat. NO VT/AF. Activity 3 hr/day - Hold lasix for 2 days and then drop to 40 daily.  - Continue entresto 49/51 mg BID - Continue spironolactone 25 mg daily.   - Does not tolerate beta blockers. - Encouraged to continue [employer] HF program.   3. COPD with ongoing tobacco abuse:  - Smoking 6 cigarettes a day at least. Strongly encouraged to quit smoking.   4. PAD with Intermittent claudication: Continue medical therapy 5. Bilateral carotid stenosis 6. H/o symptomatic bradycardia: HR ok off BBs.  7. HTN - Stable on current regimen. Pt resistant to med up-titration at this time.  8. Hyperlipidemia with intolerance of multiple cholesterol meds: Tolerating Crestor 5 mg three times/week and Zetia 5mg   two times a week. Followed by PCP 9. Depression/Anxiety: Stable.   Arbutus Leas, NP-C Advanced Heart Failure Team   Patient seen and examined with Jettie Booze, NP. We discussed all aspects of the encounter. I agree with the assessment and plan as stated above.   CAD is stable. No current angina. Doing ok from HF perspective however she is dry and orthostatic today in setting of poor po intake. Will hold lasix x 2 days. Restart lasix 40 daily on Sunday. Can go back to 40 bid as needed. However hopefully addition of Entresto will limit lasix requirement. Encouraged to stop smoking completely. ICD interrogated personally in Clinic.   Glori Bickers, MD  2:11 PM

## 2017-02-26 NOTE — Patient Instructions (Signed)
Hold Furosemide (Lasix) until Sunday, then start taking 40 mg DAILY (every morning)  Your physician recommends that you schedule a follow-up appointment in: 4-6 weeks

## 2017-03-01 DIAGNOSIS — S61211A Laceration without foreign body of left index finger without damage to nail, initial encounter: Secondary | ICD-10-CM | POA: Insufficient documentation

## 2017-03-01 DIAGNOSIS — J449 Chronic obstructive pulmonary disease, unspecified: Secondary | ICD-10-CM | POA: Diagnosis not present

## 2017-03-01 DIAGNOSIS — I5022 Chronic systolic (congestive) heart failure: Secondary | ICD-10-CM | POA: Diagnosis not present

## 2017-03-01 DIAGNOSIS — I2510 Atherosclerotic heart disease of native coronary artery without angina pectoris: Secondary | ICD-10-CM | POA: Diagnosis not present

## 2017-03-01 DIAGNOSIS — Y929 Unspecified place or not applicable: Secondary | ICD-10-CM | POA: Insufficient documentation

## 2017-03-01 DIAGNOSIS — Y9389 Activity, other specified: Secondary | ICD-10-CM | POA: Diagnosis not present

## 2017-03-01 DIAGNOSIS — Z7982 Long term (current) use of aspirin: Secondary | ICD-10-CM | POA: Diagnosis not present

## 2017-03-01 DIAGNOSIS — I252 Old myocardial infarction: Secondary | ICD-10-CM | POA: Insufficient documentation

## 2017-03-01 DIAGNOSIS — I110 Hypertensive heart disease with heart failure: Secondary | ICD-10-CM | POA: Insufficient documentation

## 2017-03-01 DIAGNOSIS — F17210 Nicotine dependence, cigarettes, uncomplicated: Secondary | ICD-10-CM | POA: Diagnosis not present

## 2017-03-01 DIAGNOSIS — Z23 Encounter for immunization: Secondary | ICD-10-CM | POA: Insufficient documentation

## 2017-03-01 DIAGNOSIS — W272XXA Contact with scissors, initial encounter: Secondary | ICD-10-CM | POA: Diagnosis not present

## 2017-03-01 DIAGNOSIS — Y999 Unspecified external cause status: Secondary | ICD-10-CM | POA: Insufficient documentation

## 2017-03-02 ENCOUNTER — Encounter (HOSPITAL_COMMUNITY): Payer: Self-pay | Admitting: Emergency Medicine

## 2017-03-02 ENCOUNTER — Emergency Department (HOSPITAL_COMMUNITY)
Admission: EM | Admit: 2017-03-02 | Discharge: 2017-03-02 | Disposition: A | Discharge status: Home / Self Care | Payer: Medicare Other | Attending: Emergency Medicine | Admitting: Emergency Medicine

## 2017-03-02 DIAGNOSIS — S61211A Laceration without foreign body of left index finger without damage to nail, initial encounter: Secondary | ICD-10-CM | POA: Diagnosis not present

## 2017-03-02 MED ORDER — TETANUS-DIPHTH-ACELL PERTUSSIS 5-2.5-18.5 LF-MCG/0.5 IM SUSP
0.5000 mL | Freq: Once | INTRAMUSCULAR | Status: AC
  Administered 2017-03-02: 0.5 mL via INTRAMUSCULAR
  Filled 2017-03-02: qty 0.5

## 2017-03-02 NOTE — ED Notes (Signed)
Dr. Jeneen Rinks is at bedside, suturing finger. Pt had a laceration to the left index finger.

## 2017-03-02 NOTE — ED Provider Notes (Signed)
[facility] DEPT Provider Note   CSN: [id number] Arrival date & time: 03/01/17  2350     History   Chief Complaint Chief Complaint  Patient presents with  . Extremity Laceration    HPI [name] is a 66 y.o. female. She was using some scissors with the right hand tonight cutting some nose. She lacerated her left finger. He will stop bleeding she presents here. HPI  Past Medical History:  Diagnosis Date  . Arthritis    hands  . Asthma   . CAD (coronary artery disease)    a. s/p prior Ant MI, b. s/p prior POBA to LAD, Dx, RCA,;  c.  LHC (10/12):  dLAD 40, pCFX 30, OM1 50, pRCA 30;  d.  Carlton Adam Myoview (11/14):  High risk study; inferior, anterior, apical defects; minimal reversibility toward the apex; consistent with prior infarct and very mild peri-infarct ischemia, EF 24%, inferior/apical/anterior akinesis  . Chronic systolic heart failure ([place])   . Depression   . H/O hiatal hernia   . HLD (hyperlipidemia)   . HTN (hypertension)   . Ischemic cardiomyopathy    MRI (12/09):  High scar burden, Inf and Apical AK, EF 24%.;  Echo (10/14): EF 15%, Gr 2 DD, mild to mod MR, mod LAE, RVSF mildly reduced, mod RAE, severe TR, PASP 49-53 (mod pulmonary HTN)  . MI (myocardial infarction)   . Tobacco abuse     Patient Active Problem List   Diagnosis Date Noted  . Claudication, intermittent ([place]) 08/07/2014  . Bilateral carotid artery stenosis 08/07/2014  . Precordial chest pain 07/25/2014  . Chronic systolic heart failure ([place]) 10/10/2013  . COPD (chronic obstructive pulmonary disease) ([place]) 10/10/2013  . Tobacco use 10/10/2013  . Hyperlipidemia 06/22/2013  . Leg pain 06/05/2013  . AICD (automatic cardioverter/defibrillator) present 09/17/2011  . CHF (congestive heart failure) ([place]) 08/14/2011  . SMOKER 01/30/2011  . Other symptoms involving cardiovascular system 01/30/2011  . DEPRESSION 05/01/2009  . Essential hypertension 05/01/2009  . MYOCARDIAL INFARCTION 05/01/2009    . Coronary atherosclerosis 05/01/2009  . CARDIOMYOPATHY, ISCHEMIC 05/01/2009    Past Surgical History:  Procedure Laterality Date  . CARDIAC DEFIBRILLATOR PLACEMENT  01/18/2009   MDT single chamber ICD implanted by Dr Rayann Heman   . cardiac stents    . COLONOSCOPY  03/18/2012   Procedure: COLONOSCOPY;  Surgeon: Beryle Beams, MD;  Location: [facility] ENDOSCOPY;  Service: Endoscopy;  Laterality: N/A;  . CORONARY ANGIOPLASTY    . ESOPHAGOGASTRODUODENOSCOPY N/A 03/10/2013   Procedure: ESOPHAGOGASTRODUODENOSCOPY (EGD);  Surgeon: Beryle Beams, MD;  Location: [facility];  Service: Endoscopy;  Laterality: N/A;  . LEFT HEART CATHETERIZATION WITH CORONARY ANGIOGRAM N/A 07/26/2014   Procedure: LEFT HEART CATHETERIZATION WITH CORONARY ANGIOGRAM;  Surgeon: Jolaine Artist, MD;  Location: [facility] CATH LAB;  Service: Cardiovascular;  Laterality: N/A;    OB History    No data available       Home Medications    Prior to Admission medications   Medication Sig Start Date End Date Taking? Authorizing Provider  acetaminophen (TYLENOL ARTHRITIS PAIN) 650 MG CR tablet Take 650 mg by mouth every 8 (eight) hours as needed for pain.    Historical Provider, MD  aspirin 81 MG tablet Take 81 mg by mouth daily.      Historical Provider, MD  azelastine (ASTELIN) 0.1 % nasal spray Place 1 spray into both nostrils daily. Use in each nostril as directed     Historical Provider, MD  busPIRone (BUSPAR) 5 MG  tablet Take 5 mg by mouth 2 (two) times daily as needed for anxiety. 10/01/16   Historical Provider, MD  ezetimibe (ZETIA) 10 MG tablet TAKE 1/2 TABLET TWICE A WEEK 11/11/16   Jolaine Artist, MD  fexofenadine (ALLEGRA) 180 MG tablet Take 180 mg by mouth daily as needed for allergies.     Historical Provider, MD  fish oil-omega-3 fatty acids 1000 MG capsule Take 2 g by mouth daily.     Historical Provider, MD  fluticasone furoate-vilanterol (BREO ELLIPTA) 100-25 MCG/INH AEPB Inhale 1 puff into the lungs daily as needed  for shortness of breath or wheezing. 04/08/16   Historical Provider, MD  furosemide (LASIX) 20 MG tablet Take 2 tablets (40 mg total) by mouth daily. 02/26/17   Jolaine Artist, MD  isosorbide mononitrate (IMDUR) 30 MG 24 hr tablet Take 30 mg by mouth daily.    Historical Provider, MD  nitroGLYCERIN (NITROSTAT) 0.4 MG SL tablet Place 1 tablet (0.4 mg total) under the tongue every 5 (five) minutes as needed for chest pain. 10/28/16 10/23/17  Josue Hector, MD  NON FORMULARY OJC, organic juice cleanse dietary supplement, taking by mouth every other day    Historical Provider, MD  pantoprazole (PROTONIX) 40 MG tablet Take 40 mg by mouth daily. 11/03/16   Historical Provider, MD  PARoxetine (PAXIL) 30 MG tablet Take 30 mg by mouth daily.     Historical Provider, MD  polyethylene glycol (MIRALAX / GLYCOLAX) packet Take 17 g by mouth at bedtime.    Historical Provider, MD  rosuvastatin (CRESTOR) 5 MG tablet Take 5 mg by mouth 3 (three) times a week.    Historical Provider, MD  sacubitril-valsartan (ENTRESTO) 49-51 MG Take 1 tablet by mouth 2 (two) times daily. 10/28/16   Josue Hector, MD  spironolactone (ALDACTONE) 25 MG tablet TAKE 1 TABLET BY MOUTH EVERY DAY 06/06/15   Jolaine Artist, MD    Family History History reviewed. No pertinent family history.  Social History Social History  Substance Use Topics  . Smoking status: Current Every Day Smoker    Types: Cigarettes  . Smokeless tobacco: Never Used  . Alcohol use No     Allergies   Zebeta [bisoprolol fumarate] and Statins   Review of Systems Review of Systems  Constitutional: Negative for appetite change, chills, diaphoresis, fatigue and fever.  HENT: Negative for mouth sores, sore throat and trouble swallowing.   Eyes: Negative for visual disturbance.  Respiratory: Negative for cough, chest tightness, shortness of breath and wheezing.   Cardiovascular: Negative for chest pain.  Gastrointestinal: Negative for abdominal  distention, abdominal pain, diarrhea, nausea and vomiting.  Endocrine: Negative for polydipsia, polyphagia and polyuria.  Genitourinary: Negative for dysuria, frequency and hematuria.  Musculoskeletal: Negative for gait problem.  Skin: Positive for wound. Negative for color change, pallor and rash.  Neurological: Negative for dizziness, syncope, light-headedness and headaches.  Hematological: Does not bruise/bleed easily.  Psychiatric/Behavioral: Negative for behavioral problems and confusion.     Physical Exam Updated Vital Signs BP (!) 119/57 (BP Location: Right Arm)   Pulse (!) 101   Temp 98.2 F (36.8 C) (Oral)   Resp 20   Ht 5\' 3"  (1.6 m)   Wt 118 lb (53.5 kg)   SpO2 99%   BMI 20.90 kg/m   Physical Exam  Constitutional: She is oriented to person, place, and time. She appears well-developed and well-nourished. No distress.  HENT:  Head: Normocephalic.  Eyes: Conjunctivae are normal. Pupils are  equal, round, and reactive to light. No scleral icterus.  Neck: Normal range of motion. Neck supple. No thyromegaly present.  Cardiovascular: Normal rate and regular rhythm.  Exam reveals no gallop and no friction rub.   No murmur heard. Pulmonary/Chest: Effort normal and breath sounds normal. No respiratory distress. She has no wheezes. She has no rales.  Abdominal: Soft. Bowel sounds are normal. She exhibits no distension. There is no tenderness. There is no rebound.  Musculoskeletal: Normal range of motion.  Neurological: She is alert and oriented to person, place, and time.  Skin: Skin is warm and dry. No rash noted.  1.5cm laceration on the P3 of the left index finger radial lateral. Neurovascular intact distally.  Psychiatric: She has a normal mood and affect. Her behavior is normal.     ED Treatments / Results  Labs (all labs ordered are listed, but only abnormal results are displayed) Labs Reviewed - No data to display  EKG  EKG Interpretation None        Radiology No results found.  Procedures Procedures (including critical care time)  Medications Ordered in ED Medications  Tdap (BOOSTRIX) injection 0.5 mL (not administered)     Initial Impression / Assessment and Plan / ED Course  I have reviewed the triage vital signs and the nursing notes.  Pertinent labs & imaging results that were available during my care of the patient were reviewed by me and considered in my medical decision making (see chart for details).     LACERATION REPAIR Performed by: Lolita Patella Authorized by: Lolita Patella Consent: Verbal consent obtained. Risks and benefits: risks, benefits and alternatives were discussed Consent given by: patient Patient identity confirmed: provided demographic data Prepped and Draped in normal sterile fashion Wound explored  Laceration Location: Left index finger  Laceration Length: 2 cm  No Foreign Bodies seen or palpated  Anesthesia: local infiltration  Local anesthetic: lidocaine 1 % without epinephrine  Anesthetic total: 2 ml  Irrigation method: syringe Amount of cleaning: standard  Skin closure: 4-0 nylon   Number of sutures: 4   Technique: Simple  Patient tolerance: Patient tolerated the procedure well with no immediate complications.   Final Clinical Impressions(s) / ED Diagnoses   Final diagnoses:  Laceration of left index finger without foreign body, nail damage status unspecified, initial encounter    New Prescriptions New Prescriptions   No medications on file     Tanna Furry, MD 03/02/17 [phone]

## 2017-03-02 NOTE — Discharge Instructions (Signed)
Clean wound daily and apply an antibiotic ointment and a bandage.  Suture removal in 7-10 days

## 2017-03-02 NOTE — ED Triage Notes (Signed)
Pt states [place] and cut her left index finger with a pair of scissors and cant get it to quit bleeding  Pt states she takes an aspirin a day

## 2017-03-22 ENCOUNTER — Ambulatory Visit (INDEPENDENT_AMBULATORY_CARE_PROVIDER_SITE_OTHER): Payer: Medicare Other

## 2017-03-22 ENCOUNTER — Telehealth: Payer: Self-pay | Admitting: Cardiology

## 2017-03-22 DIAGNOSIS — I5022 Chronic systolic (congestive) heart failure: Secondary | ICD-10-CM | POA: Diagnosis not present

## 2017-03-22 DIAGNOSIS — Z95810 Presence of automatic (implantable) cardiac defibrillator: Secondary | ICD-10-CM

## 2017-03-22 NOTE — Telephone Encounter (Signed)
Spoke with pt and reminded pt of remote transmission that is due today. Pt verbalized understanding.   

## 2017-03-22 NOTE — Progress Notes (Signed)
EPIC Encounter for ICM Monitoring  Patient Name: [name] is a 66 y.o. female Date: 03/22/2017 Primary Care Physican: [place] Primary Cardiologist: Nishan/Bensimhon Electrophysiologist: Allred Dry Weight:123.5 lbs            Heart Failure questions reviewed, pt symptomatic with small amount of stomach bloating.   Thoracic impedance normal.  Prescribed dosage: Furosemide 2 tablets (40 mg total) by mouth daily. May take any additional 2 tabs prn swelling/sob  Labs: 11/20/2016 Creatinine 1.24, BUN 14, Potassium 4.6, Sodium 136, EGFR 44-51 10/07/2016 Creatinine 1.23, BUN 16, Potassium 4.2, Sodium 136, eGFR 44-51 08/04/2016 Creatinine 1.15, BUN 14, Potassium 4.6, Sodium 136 09/10/2015 Creatinine 1.06, BUN 16, Potassium 4.4, Sodium 136 02/27/2015 Creatinine 1.13, BUN 18, Potassium 3.9, Sodium 135  Recommendations: She had not taken extra Furosemide for stomach bloating and encouraged her to do so if needed.  Encouraged to call for fluid symptoms.  Follow-up plan: ICM clinic phone appointment on 04/22/2017.  Office appointment scheduled on 03/26/2017 with HF clinic.  Copy of ICM check sent to device physician.   3 month ICM trend: 03/22/2017   1 Year ICM trend:      Rosalene Billings, RN 03/22/2017 2:08 PM

## 2017-03-26 ENCOUNTER — Encounter (HOSPITAL_COMMUNITY): Payer: Self-pay

## 2017-03-26 ENCOUNTER — Ambulatory Visit (HOSPITAL_COMMUNITY)
Admission: RE | Admit: 2017-03-26 | Discharge: 2017-03-26 | Disposition: A | Discharge status: Home / Self Care | Payer: Medicare Other | Source: Ambulatory Visit | Attending: Cardiology | Admitting: Cardiology

## 2017-03-26 VITALS — BP 100/58 | HR 77 | Wt 126.2 lb

## 2017-03-26 DIAGNOSIS — F17200 Nicotine dependence, unspecified, uncomplicated: Secondary | ICD-10-CM | POA: Insufficient documentation

## 2017-03-26 DIAGNOSIS — R251 Tremor, unspecified: Secondary | ICD-10-CM | POA: Insufficient documentation

## 2017-03-26 DIAGNOSIS — M19042 Primary osteoarthritis, left hand: Secondary | ICD-10-CM | POA: Insufficient documentation

## 2017-03-26 DIAGNOSIS — I509 Heart failure, unspecified: Secondary | ICD-10-CM

## 2017-03-26 DIAGNOSIS — M19041 Primary osteoarthritis, right hand: Secondary | ICD-10-CM | POA: Insufficient documentation

## 2017-03-26 DIAGNOSIS — I255 Ischemic cardiomyopathy: Secondary | ICD-10-CM | POA: Diagnosis not present

## 2017-03-26 DIAGNOSIS — I110 Hypertensive heart disease with heart failure: Secondary | ICD-10-CM | POA: Insufficient documentation

## 2017-03-26 DIAGNOSIS — I6523 Occlusion and stenosis of bilateral carotid arteries: Secondary | ICD-10-CM | POA: Insufficient documentation

## 2017-03-26 DIAGNOSIS — I272 Pulmonary hypertension, unspecified: Secondary | ICD-10-CM | POA: Insufficient documentation

## 2017-03-26 DIAGNOSIS — Z720 Tobacco use: Secondary | ICD-10-CM

## 2017-03-26 DIAGNOSIS — J449 Chronic obstructive pulmonary disease, unspecified: Secondary | ICD-10-CM | POA: Diagnosis not present

## 2017-03-26 DIAGNOSIS — E785 Hyperlipidemia, unspecified: Secondary | ICD-10-CM | POA: Insufficient documentation

## 2017-03-26 DIAGNOSIS — Z888 Allergy status to other drugs, medicaments and biological substances status: Secondary | ICD-10-CM | POA: Diagnosis not present

## 2017-03-26 DIAGNOSIS — J438 Other emphysema: Secondary | ICD-10-CM

## 2017-03-26 DIAGNOSIS — K449 Diaphragmatic hernia without obstruction or gangrene: Secondary | ICD-10-CM | POA: Diagnosis not present

## 2017-03-26 DIAGNOSIS — Z7982 Long term (current) use of aspirin: Secondary | ICD-10-CM | POA: Insufficient documentation

## 2017-03-26 DIAGNOSIS — Z95810 Presence of automatic (implantable) cardiac defibrillator: Secondary | ICD-10-CM | POA: Insufficient documentation

## 2017-03-26 DIAGNOSIS — I10 Essential (primary) hypertension: Secondary | ICD-10-CM

## 2017-03-26 DIAGNOSIS — I252 Old myocardial infarction: Secondary | ICD-10-CM | POA: Diagnosis not present

## 2017-03-26 DIAGNOSIS — F329 Major depressive disorder, single episode, unspecified: Secondary | ICD-10-CM | POA: Insufficient documentation

## 2017-03-26 DIAGNOSIS — I5022 Chronic systolic (congestive) heart failure: Secondary | ICD-10-CM | POA: Diagnosis present

## 2017-03-26 DIAGNOSIS — I2510 Atherosclerotic heart disease of native coronary artery without angina pectoris: Secondary | ICD-10-CM

## 2017-03-26 DIAGNOSIS — I739 Peripheral vascular disease, unspecified: Secondary | ICD-10-CM | POA: Diagnosis not present

## 2017-03-26 LAB — BASIC METABOLIC PANEL
Anion gap: 9 (ref 5–15)
BUN: 18 mg/dL (ref 6–20)
CHLORIDE: 105 mmol/L (ref 101–111)
CO2: 24 mmol/L (ref 22–32)
CREATININE: 1.08 mg/dL — AB (ref 0.44–1.00)
Calcium: 9.1 mg/dL (ref 8.9–10.3)
GFR calc Af Amer: 60 mL/min — ABNORMAL LOW (ref >60)
GFR calc non Af Amer: 52 mL/min — ABNORMAL LOW (ref >60)
GLUCOSE: 106 mg/dL — AB (ref 65–99)
Potassium: 4 mmol/L (ref 3.5–5.1)
SODIUM: 138 mmol/L (ref 135–145)

## 2017-03-26 LAB — MAGNESIUM: MAGNESIUM: 2.1 mg/dL (ref 1.7–2.4)

## 2017-03-26 MED ORDER — SPIRONOLACTONE 25 MG PO TABS: 25.0000 mg | ORAL_TABLET | Freq: Every day | ORAL | 4 refills | Status: DC

## 2017-03-26 NOTE — Patient Instructions (Signed)
Take Spironolactone at bedtime.  Routine lab work today. Will notify you of abnormal results  Follow up in 3 months.

## 2017-03-26 NOTE — Progress Notes (Signed)
Advanced Heart Failure Note   Patient ID: [name], female   DOB: Jul 31, 1951, 66 y.o.   MRN: [id number]  Date:  03/26/2017   ID:  SADAF PRZYBYSZ, DOB May 10, 1951, MRN [id number]  PCP:  [name] in [place] Electrophysiologist:  Dr. Thompson Grayer  General Cardiologist: Dr Johnsie Cancel HF: Dr Haroldine Laws   History of Present Illness: [name] is a 66 y.o. female who was referred to the HF Clinic for evaluation for advanced therapies.   She has a hx of CAD, s/p prior Ant MI, s/p prior POBA to LAD, Dx, RCA, CHF due to ICM with high scar burden by MRI (12/09), s/p ICD, HTN, HL, depression, tobacco abuse.   ABI 7/14 R 0.93 L 0.95 MRI (12/09):  High scar burden, Inf and Apical AK, EF 24%.  Echo 10/14 EF 15% global HK. RV mild HK  Mod MR. Severe TR. PA 51mmHG   Lexiscan Myoview (11/14): LV Ejection Fraction: 24%. Extensive scar in the inferior wall, apex and anterior wall. Minimal reversibility Carotid Doppler : August 2015 showed 60-79% bilateral ICA stenosis. No previous CVA. Cath 07/26/14 Left main: Calcified 40% mid to distal stenosis LAD: Long vessel wrapping the apex 20-30% plaque in midsection. Distal vessel is small.  LCX: Large OM-1 and large OM-2. 50% lesion in proximal OM-2 RCA: Dominant vessel. Calcified, eccentric 50-60% lesion in midsection . LV-gram done in the RAO projection: Ejection fraction = 25% Severe global HK of mid to distal LV. No MR.    CPX 11/14 FVC 2.08 (67%)  FEV1 1.58 (66%)  FEV1/FVC 76%  Resting HR: 84 Peak HR: 142 (91% age predicted max HR) BP rest: 114/60 BP peak: 148/70 (IPE) Peak VO2: 15.0 (63% predicted peak VO2) VE/VCO2 slope: 32 OUES: 0.85 Peak RER: 1.33 Ventilatory Threshold: 12.1 (50.8% predicted peak VO2) VE/MVV: 60.1% O2pulse: 6 (75% predicted O2pulse)  She returns today for regular follow up. Feeling good overall. Has been having cramps in both feet. Lightheadedness has improved but still happening only about once a week.  Taking lasix 40 mg daily. Took extra yesterday after having pizza on Wednesday and weight gain.  Watching fluid and salt otherwise. Denies DOE. Avoids steps. Sleeps on 1 pillow. [facility] Denies fevers,chills, N/V, or near syncope. Taking all medications as directed. Still smoking 4-6 cigarettes, sometimes more.   Optivol personally reviewed in clinic: Fluid index below threshold. Thoracic impedence slightly depressed with dietary non compliance. Pt activity > 4 hours daily.   Echo 09/17/16 :LVEF 25-30%, Grade 1  Labs (10/12):  K 3.2, creatinine 0.9, Hgb 13.5 Labs (9/14):    K 3.4, creatinine 1.0, pro BNP 1560, Hgb 11.8, HDL 25, LDL 60, ALT 29 Labs (10/14):  K 3.3=> 4.2, creatinine 1.2, BNP 1175 Labs (10/02/13) K 3.2 creatinine 0.9 Labs (12/13/13): LDL 134, AST 24, ALT 24, TC 221, TG 104 Labs (8/15): K 5.1, creatinine 1.1  Labs (11/15): LDL 79, HDL 67 Labs (3/16): K 3.9, creatinine 1.13  Wt Readings from Last 3 Encounters:  03/26/17 126 lb 4 oz (57.3 kg)  03/01/17 118 lb (53.5 kg)  02/26/17 126 lb 6.4 oz (57.3 kg)     Past Medical History:  Diagnosis Date  . Arthritis    hands  . Asthma   . CAD (coronary artery disease)    a. s/p prior Ant MI, b. s/p prior POBA to LAD, Dx, RCA,;  c.  LHC (10/12):  dLAD 40, pCFX 30, OM1 50,  pRCA 30;  d.  Carl[facility] Adam Myoview (11/14):  High risk study; inferior, anterior, apical defects; minimal reversibility toward the apex; consistent with prior infarct and very mild peri-infarct ischemia, EF 24%, inferior/apical/anterior akinesis  . Chronic systolic heart failure ([place])   . Depression   . H/O hiatal hernia   . HLD (hyperlipidemia)   . HTN (hypertension)   . Ischemic cardiomyopathy    MRI (12/09):  High scar burden, Inf and Apical AK, EF 24%.;  Echo (10/14): EF 15%, Gr 2 DD, mild to mod MR, mod LAE, RVSF mildly reduced, mod RAE, severe TR, PASP 49-53 (mod pulmonary HTN)  . MI (myocardial infarction) ([place])   . Tobacco abuse      Current Outpatient Prescriptions  Medication Sig Dispense Refill  . acetaminophen (TYLENOL ARTHRITIS PAIN) 650 MG CR tablet Take 650 mg by mouth every 8 (eight) hours as needed for pain.    Marland Kitchen aspirin 81 MG tablet Take 81 mg by mouth daily.      Marland Kitchen azelastine (ASTELIN) 0.1 % nasal spray Place 1 spray into both nostrils daily. Use in each nostril as directed     . busPIRone (BUSPAR) 5 MG tablet Take 5 mg by mouth 2 (two) times daily as needed for anxiety.    Marland Kitchen ezetimibe (ZETIA) 10 MG tablet TAKE 1/2 TABLET TWICE A WEEK 5 tablet 7  . fexofenadine (ALLEGRA) 180 MG tablet Take 180 mg by mouth daily as needed for allergies.     . fish oil-omega-3 fatty acids 1000 MG capsule Take 2 g by mouth daily.     . fluticasone furoate-vilanterol (BREO ELLIPTA) 100-25 MCG/INH AEPB Inhale 1 puff into the lungs daily as needed for shortness of breath or wheezing.    . furosemide (LASIX) 20 MG tablet Take 2 tablets (40 mg total) by mouth daily. 150 tablet 3  . isosorbide mononitrate (IMDUR) 30 MG 24 hr tablet Take 30 mg by mouth daily.    . NON FORMULARY OJC, organic juice cleanse dietary supplement, taking by mouth every other day    . pantoprazole (PROTONIX) 40 MG tablet Take 40 mg by mouth daily.    Marland Kitchen PARoxetine (PAXIL) 30 MG tablet Take 30 mg by mouth daily.     . polyethylene glycol (MIRALAX / GLYCOLAX) packet Take 17 g by mouth at bedtime.    . rosuvastatin (CRESTOR) 5 MG tablet Take 5 mg by mouth 3 (three) times a week.    . sacubitril-valsartan (ENTRESTO) 49-51 MG Take 1 tablet by mouth 2 (two) times daily. 60 tablet 11  . spironolactone (ALDACTONE) 25 MG tablet TAKE 1 TABLET BY MOUTH EVERY DAY 30 tablet 4  . nitroGLYCERIN (NITROSTAT) 0.4 MG SL tablet Place 1 tablet (0.4 mg total) under the tongue every 5 (five) minutes as needed for chest pain. (Patient not taking: Reported on 03/26/2017) 25 tablet 3   No current facility-administered medications for this encounter.     Allergies:    Allergies   Allergen Reactions  . Zebeta [Bisoprolol Fumarate]     Pt states she couldn't think and it messed her head up  . Statins Other (See Comments)    Gradually tired with daily Lipitor, made pt feel bad (Pt can take Crestor 5 mg 4 times per week and Zetia 5 mg every other day)    Social History:  The patient  reports that she has been smoking Cigarettes.  She has never used smokeless tobacco. She reports that she does not drink alcohol or use  drugs.   Review of systems complete and found to be negative unless listed in HPI.    Vitals:   03/26/17 1105  BP: (!) 100/58  Pulse: 77  SpO2: 100%  Weight: 126 lb 4 oz (57.3 kg)   Wt Readings from Last 3 Encounters:  03/26/17 126 lb 4 oz (57.3 kg)  03/01/17 118 lb (53.5 kg)  02/26/17 126 lb 6.4 oz (57.3 kg)    PHYSICAL EXAM:  General: Well appearing. No resp difficulty. HEENT: normal Neck: supple. JVD 5-6. Carotids 2+ bilat; no bruits. No thyromegaly or nodule noted. Cor: PMI nondisplaced. RRR, No M/G/R noted Lungs: CTAB, normal effort. Abdomen: soft, non-tender, distended, no HSM. No bruits or masses. +BS  Extremities: no cyanosis, clubbing, rash, R and LLE no edema.  Neuro: alert & orientedx3, cranial nerves grossly intact. moves all 4 extremities w/o difficulty. Affect pleasant   ASSESSMENT AND PLAN:  1. CAD s/p previous anterior MI:  Cath 8/15 nonobstructive CAD.   -  No chest pain.  - Continue ASA and statin.  2.  Chronic Systolic CHF  due to [facility] with mild RV dysfunction s/p Medtronic ICD. Ech 10/17 EF 25-30% - NYHA II. Volume status stable on exam.  - Optivol not elevated. No Arrythmias. Activity 4-5 hrs daily.  - Continue lasix 40 daily. Can take extra as needed.  - Continue entresto 49/51 mg BID - Continue spironolactone 25 mg daily.   - Does not tolerate beta blockers. - Encouraged to resume [place] HF program.  - Reinforced fluid restriction to < 2 L daily, sodium restriction to less than 2000 mg daily, and the  importance of daily weights.   3. COPD with ongoing tobacco abuse:  - Encouraged to stop smoking completely.  4. PAD with Intermittent claudication: Medical therapy. No change.  5. Bilateral carotid stenosis 6. H/o symptomatic bradycardia: Stable off BB.  7. HTN - Soft on current regimen. Pt unwilling to consider up-titration at this time.   8. Hyperlipidemia with intolerance of multiple cholesterol meds: Tolerating Crestor 5 mg three times/week and Zetia 5mg   two times a week. Followed by PCP.  - Encouraged to follow up with fasting lipids.  9. Depression/Anxiety:  - Stable.  10. Tremors - Occasional. Sound like Essential tremors. None noted on exam today.  - Contributing medications could include Paxil or Breo-Ellipta, neither of which are new medications for her.  - Encouraged to follow up with PCP for further neuro work up. No focal deficits on exam.   Labs today. Follow up 3 months. Sooner with symptoms.   Shirley Friar, PA-C  03/26/17   Greater than 50% of the 25 minute visit was spent in counseling/coordination of care regarding disease state education, medication regimen discussion with on site Pharm-D, and diet/fluid compliance.

## 2017-04-01 ENCOUNTER — Other Ambulatory Visit: Payer: Self-pay | Admitting: Internal Medicine

## 2017-04-22 ENCOUNTER — Telehealth: Payer: Self-pay

## 2017-04-22 ENCOUNTER — Ambulatory Visit (INDEPENDENT_AMBULATORY_CARE_PROVIDER_SITE_OTHER): Payer: Medicare Other

## 2017-04-22 DIAGNOSIS — I5022 Chronic systolic (congestive) heart failure: Secondary | ICD-10-CM | POA: Diagnosis not present

## 2017-04-22 DIAGNOSIS — Z95810 Presence of automatic (implantable) cardiac defibrillator: Secondary | ICD-10-CM | POA: Diagnosis not present

## 2017-04-22 NOTE — Progress Notes (Signed)
EPIC Encounter for ICM Monitoring  Patient Name: RAKIA FRAYNE is a 66 y.o. female Date: 04/22/2017 Primary Care Physican: [place], [place] At Primary Cardiologist: Nishan/Bensimhon Electrophysiologist: Allred Dry Weight:Last ICM weight 123.5 lbs     Attempted call to patient and unable to reach.  Left detailed message regarding transmission.  Transmission reviewed.    Thoracic impedance normal.  Prescribed dosage: Furosemide 2 tablets (40 mg total) by mouth daily. May take any additional 2 tabs prn swelling/sob  Labs: 11/20/2016 Creatinine 1.24, BUN 14, Potassium 4.6, Sodium 136, EGFR 44-51 10/07/2016 Creatinine 1.23, BUN 16, Potassium 4.2, Sodium 136, eGFR 44-51 08/04/2016 Creatinine 1.15, BUN 14, Potassium 4.6, Sodium 136 09/10/2015 Creatinine 1.06, BUN 16, Potassium 4.4, Sodium 136 02/27/2015 Creatinine 1.13, BUN 18, Potassium 3.9, Sodium 135  Recommendations:  Left voice mail with ICM number and encouraged to call for fluid symptoms.  Follow-up plan: ICM clinic phone appointment on 05/25/2017.    Copy of ICM check sent to device physician.   3 month ICM trend: 04/22/2017   1 Year ICM trend:      Rosalene Billings, RN 04/22/2017 11:14 AM

## 2017-04-22 NOTE — Telephone Encounter (Signed)
Remote ICM transmission received.  Attempted patient call and left detailed message regarding transmission and next ICM scheduled for 05/25/2017.  Advised to return call for any fluid symptoms or questions.

## 2017-05-25 ENCOUNTER — Ambulatory Visit (INDEPENDENT_AMBULATORY_CARE_PROVIDER_SITE_OTHER): Payer: Medicare Other

## 2017-05-25 DIAGNOSIS — I5022 Chronic systolic (congestive) heart failure: Secondary | ICD-10-CM | POA: Diagnosis not present

## 2017-05-25 DIAGNOSIS — Z95810 Presence of automatic (implantable) cardiac defibrillator: Secondary | ICD-10-CM

## 2017-05-25 NOTE — Progress Notes (Signed)
EPIC Encounter for ICM Monitoring  Patient Name: [name] is a 66 y.o. female Date: 05/25/2017 Primary Care Physican: [place], [place] At Primary Cardiologist: Nishan/Bensimhon Electrophysiologist: Allred Dry Weight:Last ICM weight 123.5 lbs       Heart Failure questions reviewed, pt asymptomatic for fluid symptoms but does have drop in BP that makes her feel dizzy at times.   Thoracic impedance normal.  Prescribed dosage: Furosemide 2 tablets (40 mg total) by mouth daily. Can take extra as needed per last HF chart note  Labs: 03/26/2017 Creatinine 1.08, BUN 18, Potassium 4.0, Sodium 138, EGFR 52-60 11/20/2016 Creatinine 1.24, BUN 14, Potassium 4.6, Sodium 136, EGFR 44-51 10/07/2016 Creatinine 1.23, BUN 16, Potassium 4.2, Sodium 136, eGFR 44-51 08/04/2016 Creatinine 1.15, BUN 14, Potassium 4.6, Sodium 136 09/10/2015 Creatinine 1.06, BUN 16, Potassium 4.4, Sodium 136 02/27/2015 Creatinine 1.13, BUN 18, Potassium 3.9, Sodium 135  Recommendations:  No changes.   Encouraged to call for fluid symptoms.  Follow-up plan: ICM clinic phone appointment on 06/25/2017.    Copy of ICM check sent to device physician.   3 month ICM trend: 05/25/2017   1 Year ICM trend:      Rosalene Billings, RN 05/25/2017 11:04 AM

## 2017-05-29 ENCOUNTER — Other Ambulatory Visit (HOSPITAL_COMMUNITY): Payer: Self-pay | Admitting: Cardiology

## 2017-06-04 ENCOUNTER — Other Ambulatory Visit: Payer: Self-pay | Admitting: Cardiovascular Disease

## 2017-06-25 ENCOUNTER — Ambulatory Visit (INDEPENDENT_AMBULATORY_CARE_PROVIDER_SITE_OTHER): Payer: Medicare Other

## 2017-06-25 DIAGNOSIS — Z95810 Presence of automatic (implantable) cardiac defibrillator: Secondary | ICD-10-CM

## 2017-06-25 DIAGNOSIS — I5022 Chronic systolic (congestive) heart failure: Secondary | ICD-10-CM

## 2017-06-25 NOTE — Progress Notes (Signed)
EPIC Encounter for ICM Monitoring  Patient Name: [name] is a 66 y.o. female Date: 06/25/2017 Primary Care Physican: [place], [place] At Primary Cardiologist: Nishan/Bensimhon Electrophysiologist: Allred Dry Weight:123 lbs      Heart Failure questions reviewed, pt asymptomatic for fluid symptoms.    Thoracic impedance normal.  Prescribed dosage: Furosemide 20 mg 2 tablets (40 mg total) by mouth daily. May take additional 2 tablets as needed for weight gain/swelling  Labs: 03/26/2017 Creatinine 1.08, BUN 18, Potassium 4.0, Sodium 138, EGFR 52-60 11/20/2016 Creatinine 1.24, BUN 14, Potassium 4.6, Sodium 136, EGFR 44-51 10/07/2016 Creatinine 1.23, BUN 16, Potassium 4.2, Sodium 136, eGFR 44-51 08/04/2016 Creatinine 1.15, BUN 14, Potassium 4.6, Sodium 136 09/10/2015 Creatinine 1.06, BUN 16, Potassium 4.4, Sodium 136 02/27/2015 Creatinine 1.13, BUN 18, Potassium 3.9, Sodium 135  Recommendations: No changes.   Encouraged to call for fluid symptoms.  Follow-up plan: ICM clinic phone appointment on 07/26/2017.  Office appointment scheduled 07/30/2017 with Dr. Haroldine Laws.  Copy of ICM check sent to device physician.   3 month ICM trend: 06/25/2017   1 Year ICM trend:      Rosalene Billings, RN 06/25/2017 9:09 AM

## 2017-06-27 ENCOUNTER — Other Ambulatory Visit (HOSPITAL_COMMUNITY): Payer: Self-pay | Admitting: Cardiology

## 2017-07-01 ENCOUNTER — Other Ambulatory Visit: Payer: Self-pay | Admitting: Internal Medicine

## 2017-07-25 ENCOUNTER — Other Ambulatory Visit: Payer: Self-pay | Admitting: Physician Assistant

## 2017-07-26 ENCOUNTER — Encounter: Payer: Medicare Other | Admitting: *Deleted

## 2017-07-29 ENCOUNTER — Encounter: Payer: Self-pay | Admitting: Cardiology

## 2017-07-30 ENCOUNTER — Encounter (HOSPITAL_COMMUNITY): Payer: Self-pay | Admitting: Internal Medicine

## 2017-07-30 ENCOUNTER — Ambulatory Visit (HOSPITAL_COMMUNITY)
Admission: RE | Admit: 2017-07-30 | Discharge: 2017-07-30 | Disposition: A | Discharge status: Home / Self Care | Payer: Medicare Other | Source: Ambulatory Visit | Attending: Internal Medicine | Admitting: Internal Medicine

## 2017-07-30 VITALS — BP 98/48 | HR 87 | Wt 130.4 lb

## 2017-07-30 DIAGNOSIS — Z7982 Long term (current) use of aspirin: Secondary | ICD-10-CM | POA: Insufficient documentation

## 2017-07-30 DIAGNOSIS — R42 Dizziness and giddiness: Secondary | ICD-10-CM

## 2017-07-30 DIAGNOSIS — R001 Bradycardia, unspecified: Secondary | ICD-10-CM | POA: Insufficient documentation

## 2017-07-30 DIAGNOSIS — I5022 Chronic systolic (congestive) heart failure: Secondary | ICD-10-CM | POA: Insufficient documentation

## 2017-07-30 DIAGNOSIS — I6523 Occlusion and stenosis of bilateral carotid arteries: Secondary | ICD-10-CM | POA: Diagnosis not present

## 2017-07-30 DIAGNOSIS — I10 Essential (primary) hypertension: Secondary | ICD-10-CM

## 2017-07-30 DIAGNOSIS — I2510 Atherosclerotic heart disease of native coronary artery without angina pectoris: Secondary | ICD-10-CM | POA: Diagnosis not present

## 2017-07-30 DIAGNOSIS — Z720 Tobacco use: Secondary | ICD-10-CM | POA: Diagnosis not present

## 2017-07-30 DIAGNOSIS — I110 Hypertensive heart disease with heart failure: Secondary | ICD-10-CM | POA: Insufficient documentation

## 2017-07-30 DIAGNOSIS — F17210 Nicotine dependence, cigarettes, uncomplicated: Secondary | ICD-10-CM | POA: Insufficient documentation

## 2017-07-30 DIAGNOSIS — I252 Old myocardial infarction: Secondary | ICD-10-CM | POA: Diagnosis not present

## 2017-07-30 DIAGNOSIS — J449 Chronic obstructive pulmonary disease, unspecified: Secondary | ICD-10-CM | POA: Diagnosis not present

## 2017-07-30 DIAGNOSIS — E785 Hyperlipidemia, unspecified: Secondary | ICD-10-CM | POA: Insufficient documentation

## 2017-07-30 DIAGNOSIS — F329 Major depressive disorder, single episode, unspecified: Secondary | ICD-10-CM | POA: Insufficient documentation

## 2017-07-30 DIAGNOSIS — J438 Other emphysema: Secondary | ICD-10-CM | POA: Diagnosis not present

## 2017-07-30 DIAGNOSIS — I509 Heart failure, unspecified: Secondary | ICD-10-CM

## 2017-07-30 DIAGNOSIS — I739 Peripheral vascular disease, unspecified: Secondary | ICD-10-CM | POA: Insufficient documentation

## 2017-07-30 DIAGNOSIS — Z79899 Other long term (current) drug therapy: Secondary | ICD-10-CM | POA: Diagnosis not present

## 2017-07-30 DIAGNOSIS — R319 Hematuria, unspecified: Secondary | ICD-10-CM | POA: Diagnosis not present

## 2017-07-30 LAB — BASIC METABOLIC PANEL
ANION GAP: 7 (ref 5–15)
BUN: 14 mg/dL (ref 6–20)
CHLORIDE: 103 mmol/L (ref 101–111)
CO2: 26 mmol/L (ref 22–32)
Calcium: 9.6 mg/dL (ref 8.9–10.3)
Creatinine, Ser: 1.24 mg/dL — ABNORMAL HIGH (ref 0.44–1.00)
GFR calc Af Amer: 51 mL/min — ABNORMAL LOW (ref >60)
GFR calc non Af Amer: 44 mL/min — ABNORMAL LOW (ref >60)
GLUCOSE: 96 mg/dL (ref 65–99)
POTASSIUM: 4.6 mmol/L (ref 3.5–5.1)
Sodium: 136 mmol/L (ref 135–145)

## 2017-07-30 NOTE — Patient Instructions (Signed)
Please pick up: Bonine OTC at your pharmacy   Labs today We will only contact you if something comes back abnormal or we need to make some changes. Otherwise no news is good news!   Your physician recommends that you schedule a follow-up appointment in: 4 months with Dr Haroldine Laws   Do the following things EVERYDAY: 1) Weigh yourself in the morning before breakfast. Write it down and keep it in a log. 2) Take your medicines as prescribed 3) Eat low salt foods-Limit salt (sodium) to 2000 mg per day.  4) Stay as active as you can everyday 5) Limit all fluids for the day to less than 2 liters

## 2017-07-30 NOTE — Progress Notes (Signed)
No ICM remote transmission received for 07/26/2017 and next ICM transmission scheduled for 08/19/2017.

## 2017-07-30 NOTE — Progress Notes (Signed)
Advanced Heart Failure Note   Patient ID: [name], female   DOB: Oct 12, 1951, 66 y.o.   MRN: [id number]  Date:  07/30/2017   ID:  [name], DOB January 15, 1951, MRN [id number]  PCP:  [name] in [place] Electrophysiologist:  Dr. Thompson Grayer  General Cardiologist: Dr Johnsie Cancel HF: Dr Haroldine Laws   History of Present Illness: [name] is a 66 y.o. female who was referred to the HF Clinic for evaluation for advanced therapies.   She has a hx of CAD, s/p prior Ant MI, s/p prior POBA to LAD, Dx, RCA, CHF due to ICM with high scar burden by MRI (12/09), s/p ICD, HTN, HL, depression, tobacco abuse.   ABI 7/14 R 0.93 L 0.95 MRI (12/09):  High scar burden, Inf and Apical AK, EF 24%.  Echo 10/14 EF 15% global HK. RV mild HK  Mod MR. Severe TR. PA 28mmHG   Lexiscan Myoview (11/14): LV Ejection Fraction: 24%. Extensive scar in the inferior wall, apex and anterior wall. Minimal reversibility Carotid Doppler : August 2015 showed 60-79% bilateral ICA stenosis. No previous CVA. Cath 07/26/14 Left main: Calcified 40% mid to distal stenosis LAD: Long vessel wrapping the apex 20-30% plaque in midsection. Distal vessel is small.  LCX: Large OM-1 and large OM-2. 50% lesion in proximal OM-2 RCA: Dominant vessel. Calcified, eccentric 50-60% lesion in midsection . LV-gram done in the RAO projection: Ejection fraction = 25% Severe global HK of mid to distal LV. No MR.    CPX 11/14 FVC 2.08 (67%)  FEV1 1.58 (66%)  FEV1/FVC 76%  Resting HR: 84 Peak HR: 142 (91% age predicted max HR) BP rest: 114/60 BP peak: 148/70 (IPE) Peak VO2: 15.0 (63% predicted peak VO2) VE/VCO2 slope: 32 OUES: 0.85 Peak RER: 1.33 Ventilatory Threshold: 12.1 (50.8% predicted peak VO2) VE/MVV: 60.1% O2pulse: 6 (75% predicted O2pulse)  She presents today for regular follow up. No changes to medications at last visit. Weight up 4 lbs from last visit. Weight at home 126-128. No SOB on flat ground. Mild SOB  up a hill, stairs, or an incline. Taking lasix 40 mg daily. She has not been back to the [employer], but has been busy the summer with her teenage grandkids. She has had 2 episodes of "blood in her urine" states it was a small amount in the toilet bowel. Last episode > 6 weeks ago. Occasionally lightheaded with standing, but often has a "spinning sensation" when she is at rest.   Optivol personally reviewed in clinic: Fluid index below threshold. Thoracic impedence slightly depress. Pt activity > 4 hrs daily.  Echo 09/17/16 :LVEF 25-30%, Grade 1  Labs (10/12):  K 3.2, creatinine 0.9, Hgb 13.5 Labs (9/14):    K 3.4, creatinine 1.0, pro BNP 1560, Hgb 11.8, HDL 25, LDL 60, ALT 29 Labs (10/14):  K 3.3=> 4.2, creatinine 1.2, BNP 1175 Labs (10/02/13) K 3.2 creatinine 0.9 Labs (12/13/13): LDL 134, AST 24, ALT 24, TC 221, TG 104 Labs (8/15): K 5.1, creatinine 1.1  Labs (11/15): LDL 79, HDL 67 Labs (3/16): K 3.9, creatinine 1.13  Wt Readings from Last 3 Encounters:  07/30/17 130 lb 6.4 oz (59.1 kg)  03/26/17 126 lb 4 oz (57.3 kg)  03/01/17 118 lb (53.5 kg)     Past Medical History:  Diagnosis Date  . Arthritis    hands  . Asthma   . CAD (coronary artery disease)    a. s/p prior Ant MI, b. s/p prior  POBA to LAD, Dx, RCA,;  c.  LHC (10/12):  dLAD 40, pCFX 30, OM1 50, pRCA 30;  d.  Lexiscan Myoview (11/14):  High risk study; inferior, anterior, apical defects; minimal reversibility toward the apex; consistent with prior infarct and very mild peri-infarct ischemia, EF 24%, inferior/apical/anterior akinesis  . Chronic systolic heart failure ([place])   . Depression   . H/O hiatal hernia   . HLD (hyperlipidemia)   . HTN (hypertension)   . Ischemic cardiomyopathy    MRI (12/09):  High scar burden, Inf and Apical AK, EF 24%.;  Echo (10/14): EF 15%, Gr 2 DD, mild to mod MR, mod LAE, RVSF mildly reduced, mod RAE, severe TR, PASP 49-53 (mod pulmonary HTN)  . MI (myocardial infarction) ([place])   . Tobacco abuse      Current Outpatient Prescriptions  Medication Sig Dispense Refill  . acetaminophen (TYLENOL ARTHRITIS PAIN) 650 MG CR tablet Take 650 mg by mouth every 8 (eight) hours as needed for pain.    Marland Kitchen aspirin 81 MG tablet Take 81 mg by mouth daily.      Marland Kitchen azelastine (ASTELIN) 0.1 % nasal spray Place 1 spray into both nostrils daily. Use in each nostril as directed     . busPIRone (BUSPAR) 5 MG tablet Take 5 mg by mouth 2 (two) times daily as needed for anxiety.    Marland Kitchen ezetimibe (ZETIA) 10 MG tablet TAKE 1/2 TABLET TWICE A WEEK 5 tablet 11  . fexofenadine (ALLEGRA) 180 MG tablet Take 180 mg by mouth daily as needed for allergies.     . fish oil-omega-3 fatty acids 1000 MG capsule Take 2 g by mouth daily.     . fluticasone furoate-vilanterol (BREO ELLIPTA) 100-25 MCG/INH AEPB Inhale 1 puff into the lungs daily as needed for shortness of breath or wheezing.    . furosemide (LASIX) 20 MG tablet Take 2 tablets (40 mg total) by mouth daily. 150 tablet 3  . furosemide (LASIX) 20 MG tablet TAKE 2 TABLETS BY MOUTH DAILY. MAY TAKE ADDITIONAL 2 TABS AS NEEDED FOR SWELLING/ SHORT OF BREATH 120 tablet 3  . isosorbide mononitrate (IMDUR) 30 MG 24 hr tablet Take 30 mg by mouth daily.    . isosorbide mononitrate (IMDUR) 30 MG 24 hr tablet TAKE 1 TABLET BY MOUTH EVERY DAY 30 tablet 11  . nitroGLYCERIN (NITROSTAT) 0.4 MG SL tablet Place 1 tablet (0.4 mg total) under the tongue every 5 (five) minutes as needed for chest pain. 25 tablet 3  . NON FORMULARY OJC, organic juice cleanse dietary supplement, taking by mouth every other day    . pantoprazole (PROTONIX) 40 MG tablet Take 40 mg by mouth daily.    Marland Kitchen PARoxetine (PAXIL) 30 MG tablet Take 30 mg by mouth daily.     . polyethylene glycol (MIRALAX / GLYCOLAX) packet Take 17 g by mouth at bedtime.    . rosuvastatin (CRESTOR) 5 MG tablet Take 5 mg by mouth 3 (three) times a week.    . rosuvastatin (CRESTOR) 5 MG tablet TAKE 1 TABLET BY MOUTH EVERY DAY ON MONDAY,  WEDNESDAY,AND FRIDAY 30 tablet 6  . sacubitril-valsartan (ENTRESTO) 49-51 MG Take 1 tablet by mouth 2 (two) times daily. 60 tablet 11  . spironolactone (ALDACTONE) 25 MG tablet Take 1 tablet (25 mg total) by mouth at bedtime. 30 tablet 4  . spironolactone (ALDACTONE) 25 MG tablet TAKE 1 TABLET BY MOUTH EVERY DAY 30 tablet 4   No current facility-administered medications for this encounter.  Allergies:    Allergies  Allergen Reactions  . Zebeta [Bisoprolol Fumarate]     Pt states she couldn't think and it messed her head up  . Statins Other (See Comments)    Gradually tired with daily Lipitor, made pt feel bad (Pt can take Crestor 5 mg 4 times per week and Zetia 5 mg every other day)    Social History:  The patient  reports that she has been smoking Cigarettes.  She has never used smokeless tobacco. She reports that she does not drink alcohol or use drugs.   Review of systems complete and found to be negative unless listed in HPI.    Vitals:   07/30/17 1032  BP: (!) 98/48  Pulse: 87  SpO2: 99%  Weight: 130 lb 6.4 oz (59.1 kg)   Orthostatics Sitting      108-84 Standing  102/76  Wt Readings from Last 3 Encounters:  07/30/17 130 lb 6.4 oz (59.1 kg)  03/26/17 126 lb 4 oz (57.3 kg)  03/01/17 118 lb (53.5 kg)    PHYSICAL EXAM:  General: Well appearing. No resp difficulty. HEENT: Normal Neck: Supple. JVP 6-7 cm. Carotids 2+ bilat; no bruits. No thyromegaly or nodule noted. Cor: PMI nondisplaced. RRR, No M/G/R noted Lungs: CTAB, normal effort. Abdomen: Soft, non-tender, non-distended, no HSM. No bruits or masses. +BS  Extremities: No cyanosis, clubbing, rash, R and LLE no edema.  Neuro: Alert & orientedx3, cranial nerves grossly intact. moves all 4 extremities w/o difficulty. Affect pleasant   ASSESSMENT AND PLAN:  1. CAD s/p previous anterior MI:  Cath 8/15 nonobstructive CAD.   -  No chest pain.  - Continue ASA and statin.  - Followed by Dr. Johnsie Cancel  2.  Chronic  Systolic CHF  due to [facility] with mild RV dysfunction s/p [employer] ICD. Echo 10/17 EF 25-30%. No VT.  - Due for repeat Echo - scheduled for next month   - NYHA class II symptoms.  - Volume status stable on exam. Continue lasix 40 mg daily. Can take extra as needed.  - Continue entresto 49/51 mg BID. No room to up-titrate.  - Continue spironolactone 25 mg daily.   - Does not tolerate beta blockers. - Encouraged to resume [name] HF program. Needs to restart.  - Reinforced fluid restriction to < 2 L daily, sodium restriction to less than 2000 mg daily, and the importance of daily weights.   3. COPD with ongoing tobacco abuse:  - Encouraged to stop smoking completely.  4. PAD with Intermittent claudication: Medical therapy. No change.  - had f/u with Dr. Johnsie Cancel  5. Bilateral carotid stenosis 6. H/o symptomatic bradycardia:  - Stable off BB. No room to rechallenge.  7. HTN - Soft on current regimen. No room for up-titration.  8. Hyperlipidemia with intolerance of multiple cholesterol meds: Tolerating Crestor 5 mg three times/week and Zetia 5mg   two times a week.  - Per PCP.  9. Depression/Anxiety:  - Stable.  10. Dizziness - Not orthostatics. Has room spinning sensation, but not significant or when lying in bed.  - Encouraged to try Bonine/Meclizine OTC and follow up with PCP 11. Hematuria - Does not sound like gross hematuria and has had 2 distant episodes. - Encouraged follow up with PCP, and encouraged her to call PCP immediately if re-occurs or worsens for follow up.   Shirley Friar, PA-C  07/30/17    Patient seen and examined with the above-signed Advanced Practice Provider and/or Housestaff. I personally  reviewed laboratory data, imaging studies and relevant notes. I independently examined the patient and formulated the important aspects of the plan. I have edited the note to reflect any of my changes or salient points. I have personally discussed the plan with the patient  and/or family.  Overall doing well from a HF standpoint. Volume status looks good on exam and by Optivol. (ICD interrogated personally in Clinic). Getting stronger. BP too low to titrate meds further. Await repeat echo next month to reassess EF. Can cut back diuretics a bit if needed.   Regarding painless hematuria. I have asked her to f/u with PCP to check UA and possible urine cytology and CT scan to exclude RCC in setting of tobacco use.   CAD is stable. No s/s ischemia.Cluadication stable. Due for repeat ABIs next month.   Glori Bickers, MD  11:39 AM

## 2017-07-30 NOTE — Addendum Note (Signed)
Encounter addended by: Kerry Dory, CMA on: 07/30/2017 11:43 AM<BR>    Actions taken: Order list changed, Diagnosis association updated, Sign clinical note

## 2017-08-05 ENCOUNTER — Other Ambulatory Visit: Payer: Self-pay | Admitting: Cardiovascular Disease

## 2017-08-05 ENCOUNTER — Other Ambulatory Visit: Payer: Self-pay | Admitting: Internal Medicine

## 2017-08-05 DIAGNOSIS — I739 Peripheral vascular disease, unspecified: Secondary | ICD-10-CM

## 2017-08-10 ENCOUNTER — Ambulatory Visit (HOSPITAL_COMMUNITY)
Admission: RE | Admit: 2017-08-10 | Discharge: 2017-08-10 | Disposition: A | Discharge status: Home / Self Care | Payer: Medicare Other | Source: Ambulatory Visit | Attending: Cardiovascular Disease | Admitting: Cardiovascular Disease

## 2017-08-10 DIAGNOSIS — I10 Essential (primary) hypertension: Secondary | ICD-10-CM | POA: Insufficient documentation

## 2017-08-10 DIAGNOSIS — I2510 Atherosclerotic heart disease of native coronary artery without angina pectoris: Secondary | ICD-10-CM | POA: Diagnosis not present

## 2017-08-10 DIAGNOSIS — E785 Hyperlipidemia, unspecified: Secondary | ICD-10-CM | POA: Diagnosis not present

## 2017-08-10 DIAGNOSIS — Z720 Tobacco use: Secondary | ICD-10-CM | POA: Insufficient documentation

## 2017-08-10 DIAGNOSIS — I739 Peripheral vascular disease, unspecified: Secondary | ICD-10-CM | POA: Insufficient documentation

## 2017-08-10 DIAGNOSIS — I5040 Unspecified combined systolic (congestive) and diastolic (congestive) heart failure: Secondary | ICD-10-CM | POA: Diagnosis present

## 2017-08-10 DIAGNOSIS — I6523 Occlusion and stenosis of bilateral carotid arteries: Secondary | ICD-10-CM | POA: Diagnosis present

## 2017-08-10 DIAGNOSIS — R938 Abnormal findings on diagnostic imaging of other specified body structures: Secondary | ICD-10-CM | POA: Insufficient documentation

## 2017-08-10 DIAGNOSIS — I110 Hypertensive heart disease with heart failure: Secondary | ICD-10-CM | POA: Insufficient documentation

## 2017-08-13 ENCOUNTER — Telehealth: Payer: Self-pay

## 2017-08-13 DIAGNOSIS — I779 Disorder of arteries and arterioles, unspecified: Secondary | ICD-10-CM

## 2017-08-13 DIAGNOSIS — I739 Peripheral vascular disease, unspecified: Principal | ICD-10-CM

## 2017-08-13 NOTE — Telephone Encounter (Signed)
-----   Message from Josue Hector, MD sent at 08/10/2017 11:20 PM EDT ----- 60-79% RICA stenosis.  F/U carotid duplex in 6 months 18-33% LICA stenosis.

## 2017-08-13 NOTE — Telephone Encounter (Signed)
Patient aware of carotid. Order in for repeat carotid duplex. Put in recall for patient to have carotid done in 6 months.

## 2017-08-19 ENCOUNTER — Ambulatory Visit (INDEPENDENT_AMBULATORY_CARE_PROVIDER_SITE_OTHER): Payer: Medicare Other

## 2017-08-19 DIAGNOSIS — Z95810 Presence of automatic (implantable) cardiac defibrillator: Secondary | ICD-10-CM

## 2017-08-19 DIAGNOSIS — I5022 Chronic systolic (congestive) heart failure: Secondary | ICD-10-CM | POA: Diagnosis not present

## 2017-08-19 NOTE — Progress Notes (Signed)
EPIC Encounter for ICM Monitoring  Patient Name: [name] is a 66 y.o. female Date: 08/19/2017 Primary Care Physican: [facility] Primary Cardiologist: Nishan/Bensimhon Electrophysiologist: Allred Dry Weight:128 lbs            Heart Failure questions reviewed, pt asymptomatic.   Thoracic impedance normal.  Prescribed dosage: Furosemide 20 mg 2 tablets (40 mg total) by mouth daily. May take additional 2 tablets as needed for weight gain/swelling  Labs: 07/30/2017 Creatinine 1.24, BUN 14, Potassium 4.6, Sodium 136, EGFR 44-51 03/26/2017 Creatinine 1.08, BUN 18, Potassium 4.0, Sodium 138, EGFR 52-60 11/20/2016 Creatinine 1.24, BUN 14, Potassium 4.6, Sodium 136, EGFR 44-51 10/07/2016 Creatinine 1.23, BUN 16, Potassium 4.2, Sodium 136, eGFR 44-51 08/04/2016 Creatinine 1.15, BUN 14, Potassium 4.6, Sodium 136 09/10/2015 Creatinine 1.06, BUN 16, Potassium 4.4, Sodium 136 02/27/2015 Creatinine 1.13, BUN 18, Potassium 3.9, Sodium 135  Recommendations: No changes.   Encouraged to call for fluid symptoms.  Follow-up plan: ICM clinic phone appointment on 09/21/2017.    Copy of ICM check sent to Dr. Rayann Heman.   3 month ICM trend: 08/19/2017   1 Year ICM trend:      Rosalene Billings, RN 08/19/2017 10:05 AM

## 2017-08-24 ENCOUNTER — Ambulatory Visit (INDEPENDENT_AMBULATORY_CARE_PROVIDER_SITE_OTHER): Payer: Medicare Other | Admitting: Cardiovascular Disease

## 2017-08-24 ENCOUNTER — Encounter: Payer: Self-pay | Admitting: Cardiovascular Disease

## 2017-08-24 VITALS — BP 92/56 | HR 86 | Ht 63.5 in | Wt 129.4 lb

## 2017-08-24 DIAGNOSIS — I739 Peripheral vascular disease, unspecified: Secondary | ICD-10-CM | POA: Diagnosis not present

## 2017-08-24 DIAGNOSIS — I2510 Atherosclerotic heart disease of native coronary artery without angina pectoris: Secondary | ICD-10-CM

## 2017-08-24 DIAGNOSIS — I5022 Chronic systolic (congestive) heart failure: Secondary | ICD-10-CM

## 2017-08-24 DIAGNOSIS — Z720 Tobacco use: Secondary | ICD-10-CM | POA: Diagnosis not present

## 2017-08-24 DIAGNOSIS — I6523 Occlusion and stenosis of bilateral carotid arteries: Secondary | ICD-10-CM | POA: Diagnosis not present

## 2017-08-24 NOTE — Progress Notes (Signed)
Cardiology Office Note   Date:  08/24/2017   ID:  [name], DOB 05-24-1951, MRN [id number]  PCP:  [facility]  Cardiologist:  Dr. Eber Jones  Chief Complaint  Patient presents with  . New Patient (Initial Visit)    PVD      History of Present Illness: [name] is a 66 y.o. female who was referred for evaluation and management of peripheral arterial disease. She was seen by me in 2015. She has a hx of CAD, s/p prior Ant MI, s/p prior POBA to LAD, Dx, RCA, CHF due to ICM with high scar burden by MRI (12/09), s/p ICD, HTN, HL, depression, carotid disease and tobacco abuse.  She was most recently seen by me in 2015 for claudication. Lower extremity arterial Doppler showed  a drop in ABI on the left side to 0.56 and 0.78 on the right side. There was evidence of bilateral SFA disease. The patient elected for continued medical therapy given that she did not feel significantly limited by her symptoms. She underwent recent noninvasive vascular evaluation which showed an ABI of 0.96 on the right and 0.69 on the left. She reports no significant change in lower extremity claudication. She usually does not have symptoms walking on flat level but she does complain of bilateral calf discomfort worse on the left side if she goes up hill. No pain at rest or lower extremity ulceration. She is able to do her yard work without having to stop. She continues to smoke although she cut down to 6 cigarettes a day.  Past Medical History:  Diagnosis Date  . Arthritis    hands  . Asthma   . CAD (coronary artery disease)    a. s/p prior Ant MI, b. s/p prior POBA to LAD, Dx, RCA,;  c.  LHC (10/12):  dLAD 40, pCFX 30, OM1 50, pRCA 30;  d.  [facility] Adam Myoview (11/14):  High risk study; inferior, anterior, apical defects; minimal reversibility toward the apex; consistent with prior infarct and very mild peri-infarct ischemia, EF 24%, inferior/apical/anterior akinesis  .  Chronic systolic heart failure ([place])   . Depression   . H/O hiatal hernia   . HLD (hyperlipidemia)   . HTN (hypertension)   . Ischemic cardiomyopathy    MRI (12/09):  High scar burden, Inf and Apical AK, EF 24%.;  Echo (10/14): EF 15%, Gr 2 DD, mild to mod MR, mod LAE, RVSF mildly reduced, mod RAE, severe TR, PASP 49-53 (mod pulmonary HTN)  . MI (myocardial infarction) ([place])   . Tobacco abuse     Past Surgical History:  Procedure Laterality Date  . CARDIAC DEFIBRILLATOR PLACEMENT  01/18/2009   MDT single chamber ICD implanted by Dr Rayann Heman   . cardiac stents    . COLONOSCOPY  03/18/2012   Procedure: COLONOSCOPY;  Surgeon: Beryle Beams, MD;  Location: [facility] ENDOSCOPY;  Service: Endoscopy;  Laterality: N/A;  . CORONARY ANGIOPLASTY    . ESOPHAGOGASTRODUODENOSCOPY N/A 03/10/2013   Procedure: ESOPHAGOGASTRODUODENOSCOPY (EGD);  Surgeon: Beryle Beams, MD;  Location: [facility];  Service: Endoscopy;  Laterality: N/A;  . LEFT HEART CATHETERIZATION WITH CORONARY ANGIOGRAM N/A 07/26/2014   Procedure: LEFT HEART CATHETERIZATION WITH CORONARY ANGIOGRAM;  Surgeon: Jolaine Artist, MD;  Location: [facility] CATH LAB;  Service: Cardiovascular;  Laterality: N/A;     Current Outpatient Prescriptions  Medication Sig Dispense Refill  . acetaminophen (TYLENOL ARTHRITIS PAIN) 650 MG CR tablet Take 650 mg by mouth  every 8 (eight) hours as needed for pain.    Marland Kitchen aspirin 81 MG tablet Take 81 mg by mouth daily.      Marland Kitchen azelastine (ASTELIN) 0.1 % nasal spray Place 1 spray into both nostrils daily. Use in each nostril as directed     . busPIRone (BUSPAR) 5 MG tablet Take 5 mg by mouth 2 (two) times daily as needed for anxiety.    Marland Kitchen ezetimibe (ZETIA) 10 MG tablet TAKE 1/2 TABLET TWICE A WEEK 5 tablet 11  . fexofenadine (ALLEGRA) 180 MG tablet Take 180 mg by mouth daily as needed for allergies.     . fish oil-omega-3 fatty acids 1000 MG capsule Take 2 g by mouth daily.     . fluticasone furoate-vilanterol (BREO ELLIPTA)  100-25 MCG/INH AEPB Inhale 1 puff into the lungs daily as needed for shortness of breath or wheezing.    . furosemide (LASIX) 20 MG tablet Take 2 tablets (40 mg total) by mouth daily. 150 tablet 3  . furosemide (LASIX) 20 MG tablet TAKE 2 TABLETS BY MOUTH DAILY. MAY TAKE ADDITIONAL 2 TABS AS NEEDED FOR SWELLING/ SHORT OF BREATH 120 tablet 3  . isosorbide mononitrate (IMDUR) 30 MG 24 hr tablet Take 30 mg by mouth daily.    . isosorbide mononitrate (IMDUR) 30 MG 24 hr tablet TAKE 1 TABLET BY MOUTH EVERY DAY 30 tablet 11  . nitroGLYCERIN (NITROSTAT) 0.4 MG SL tablet Place 1 tablet (0.4 mg total) under the tongue every 5 (five) minutes as needed for chest pain. 25 tablet 3  . NON FORMULARY OJC, organic juice cleanse dietary supplement, taking by mouth every other day    . pantoprazole (PROTONIX) 40 MG tablet Take 40 mg by mouth daily.    Marland Kitchen PARoxetine (PAXIL) 30 MG tablet Take 30 mg by mouth daily.     . polyethylene glycol (MIRALAX / GLYCOLAX) packet Take 17 g by mouth at bedtime.    . rosuvastatin (CRESTOR) 5 MG tablet Take 5 mg by mouth 3 (three) times a week.    . rosuvastatin (CRESTOR) 5 MG tablet TAKE 1 TABLET BY MOUTH EVERY DAY ON MONDAY, WEDNESDAY,AND FRIDAY 30 tablet 6  . sacubitril-valsartan (ENTRESTO) 49-51 MG Take 1 tablet by mouth 2 (two) times daily. 60 tablet 11  . spironolactone (ALDACTONE) 25 MG tablet Take 1 tablet (25 mg total) by mouth at bedtime. 30 tablet 4  . spironolactone (ALDACTONE) 25 MG tablet TAKE 1 TABLET BY MOUTH EVERY DAY 30 tablet 4   No current facility-administered medications for this visit.     Allergies:   Zebeta [bisoprolol fumarate] and Statins    Social History:  The patient  reports that she has been smoking Cigarettes.  She has never used smokeless tobacco. She reports that she does not drink alcohol or use drugs.   Family History:  The patient's family history is not on file.    ROS:  Please see the history of present illness.   Otherwise, review of  systems are positive for none.   All other systems are reviewed and negative.    PHYSICAL EXAM: VS:  BP (!) 92/56   Pulse 86   Ht 5' 3.5" (1.613 m)   Wt 129 lb 6.4 oz (58.7 kg)   BMI 22.56 kg/m  , BMI Body mass index is 22.56 kg/m. GEN: Well nourished, well developed, in no acute distress  HEENT: normal  Neck: no JVD, carotid bruits, or masses Cardiac: RRR; no murmurs, rubs, or gallops,no edema  Respiratory:  clear to auscultation bilaterally, normal work of breathing GI: soft, nontender, nondistended, + BS MS: no deformity or atrophy  Skin: warm and dry, no rash Neuro:  Strength and sensation are intact Psych: euthymic mood, full affect Vascular: Femoral pulses normal bilaterally. Distal pulses are not palpable.  EKG:  EKG is not ordered today.    Recent Labs: 11/20/2016: B Natriuretic Peptide 225.7 03/26/2017: Magnesium 2.1 07/30/2017: BUN 14; Creatinine, Ser 1.24; Potassium 4.6; Sodium 136    Lipid Panel    Component Value Date/Time   CHOL 165 08/04/2016 1033   TRIG 117 08/04/2016 1033   HDL 65 08/04/2016 1033   CHOLHDL 2.5 08/04/2016 1033   VLDL 23 08/04/2016 1033   LDLCALC 77 08/04/2016 1033   LDLDIRECT 134.7 12/13/2013 1043      Wt Readings from Last 3 Encounters:  08/24/17 129 lb 6.4 oz (58.7 kg)  07/30/17 130 lb 6.4 oz (59.1 kg)  03/26/17 126 lb 4 oz (57.3 kg)       PAD Screen 08/24/2017  Previous PAD dx? Yes  Previous surgical procedure? No  Pain with walking? Yes  Subsides with rest? Yes  Feet/toe relief with dangling? No  Painful, non-healing ulcers? No  Extremities discolored? No      ASSESSMENT AND PLAN:  1.  Peripheral arterial disease: Moderate bilateral calf claudication worse on the left side due to known SFA disease. She reports no significant worsening of symptoms since 2015. I discussed with her the option of proceeding with angiography and possible endovascular intervention. She does not feel significantly limited by claudication  and wants to continue with medical therapy.  2. Coronary artery disease involving native coronary arteries without angina: Continue medical therapy.  3. Chronic systolic heart failure: She appears to be euvolemic  4. Tobacco use: I had a prolonged discussion with her about the importance of smoking cessation. I offered to prescribe Chantix but she wants to make up her mind before committing.  5. Hyperlipidemia: Continue treatment with Zetia and rosuvastatin.    Disposition:   FU with me in 6 months  Signed,  Kathlyn Sacramento, MD  08/24/2017 5:53 PM    [place]

## 2017-08-24 NOTE — Patient Instructions (Signed)
Steps to Quit Smoking Smoking tobacco can be bad for your health. It can also affect almost every organ in your body. Smoking puts you and people around you at risk for many serious long-lasting (chronic) diseases. Quitting smoking is hard, but it is one of the best things that you can do for your health. It is never too late to quit. What are the benefits of quitting smoking? When you quit smoking, you lower your risk for getting serious diseases and conditions. They can include:  Lung cancer or lung disease.  Heart disease.  Stroke.  Heart attack.  Not being able to have children (infertility).  Weak bones (osteoporosis) and broken bones (fractures).  If you have coughing, wheezing, and shortness of breath, those symptoms may get better when you quit. You may also get sick less often. If you are pregnant, quitting smoking can help to lower your chances of having a baby of low birth weight. What can I do to help me quit smoking? Talk with your doctor about what can help you quit smoking. Some things you can do (strategies) include:  Quitting smoking totally, instead of slowly cutting back how much you smoke over a period of time.  Going to in-person counseling. You are more likely to quit if you go to many counseling sessions.  Using resources and support systems, such as: ? Online chats with a counselor. ? Phone quitlines. ? Printed self-help materials. ? Support groups or group counseling. ? Text messaging programs. ? Mobile phone apps or applications.  Taking medicines. Some of these medicines may have nicotine in them. If you are pregnant or breastfeeding, do not take any medicines to quit smoking unless your doctor says it is okay. Talk with your doctor about counseling or other things that can help you.  Talk with your doctor about using more than one strategy at the same time, such as taking medicines while you are also going to in-person counseling. This can help make  quitting easier. What things can I do to make it easier to quit? Quitting smoking might feel very hard at first, but there is a lot that you can do to make it easier. Take these steps:  Talk to your family and friends. Ask them to support and encourage you.  Call phone quitlines, reach out to support groups, or work with a counselor.  Ask people who smoke to not smoke around you.  Avoid places that make you want (trigger) to smoke, such as: ? Bars. ? Parties. ? Smoke-break areas at work.  Spend time with people who do not smoke.  Lower the stress in your life. Stress can make you want to smoke. Try these things to help your stress: ? Getting regular exercise. ? Deep-breathing exercises. ? Yoga. ? Meditating. ? Doing a body scan. To do this, close your eyes, focus on one area of your body at a time from head to toe, and notice which parts of your body are tense. Try to relax the muscles in those areas.  Download or buy apps on your mobile phone or tablet that can help you stick to your quit plan. There are many free apps, such as QuitGuide from the CDC (Centers for Disease Control and Prevention). You can find more support from smokefree.gov and other websites.  This information is not intended to replace advice given to you by your health care provider. Make sure you discuss any questions you have with your health care provider. Document Released: 09/19/2009 Document   Revised: 07/21/2016 Document Reviewed: 04/09/2015 Elsevier Interactive Patient Education  2018 [place] physician wants you to follow-up in: [place] will receive a reminder letter in the mail two months in advance. If you don't receive a letter, please call our office to schedule the follow-up appointment.

## 2017-09-07 ENCOUNTER — Ambulatory Visit: Payer: Medicare Other | Admitting: Cardiovascular Disease

## 2017-09-21 ENCOUNTER — Telehealth: Payer: Self-pay | Admitting: Cardiology

## 2017-09-21 ENCOUNTER — Telehealth (HOSPITAL_COMMUNITY): Payer: Self-pay | Admitting: Pharmacist

## 2017-09-21 ENCOUNTER — Encounter: Payer: Medicare Other | Admitting: *Deleted

## 2017-09-21 NOTE — Telephone Encounter (Signed)
Spoke with pt and reminded pt of remote transmission that is due today. Pt verbalized understanding.   

## 2017-09-21 NOTE — Telephone Encounter (Signed)
Since PANF has run out of funding, Ms. Goree cannot afford her Entresto copays. I have recommended applying for the [employer] PAF and will mail her the application to her home. In the meantime, if she runs out of medication, will provide her with samples.   Ruta Hinds. Velva Harman, PharmD, BCPS, CPP Clinical Pharmacist Pager: [phone] Phone: [phone] 09/21/2017 3:03 PM

## 2017-09-23 ENCOUNTER — Other Ambulatory Visit (HOSPITAL_COMMUNITY): Payer: Self-pay | Admitting: Internal Medicine

## 2017-09-24 ENCOUNTER — Encounter: Payer: Self-pay | Admitting: Cardiology

## 2017-09-27 ENCOUNTER — Other Ambulatory Visit: Payer: Self-pay | Admitting: *Deleted

## 2017-09-27 DIAGNOSIS — I739 Peripheral vascular disease, unspecified: Secondary | ICD-10-CM

## 2017-09-28 ENCOUNTER — Ambulatory Visit (INDEPENDENT_AMBULATORY_CARE_PROVIDER_SITE_OTHER): Payer: Medicare Other

## 2017-09-28 DIAGNOSIS — Z95810 Presence of automatic (implantable) cardiac defibrillator: Secondary | ICD-10-CM

## 2017-09-28 DIAGNOSIS — I5022 Chronic systolic (congestive) heart failure: Secondary | ICD-10-CM

## 2017-09-28 NOTE — Progress Notes (Signed)
EPIC Encounter for ICM Monitoring  Patient Name: [name] is a 66 y.o. female Date: 09/28/2017 Primary Care Physican: [place] [place] [facility] Primary Cardiologist: Nishan/Bensimhon Electrophysiologist: Allred Dry Weight:128 lbs   Since 28-Jan-2017 VT-NS (>4 beats, >200 bpm)   3     Heart Failure questions reviewed, pt asymptomatic.   Thoracic impedance normal.  Prescribed dosage: Furosemide 20 mg 2 tablets (40 mg total) by mouth daily. May take additional 2 tablets as needed for weight gain/swelling  Labs: 07/30/2017 Creatinine 1.24, BUN 14, Potassium 4.6, Sodium 136, EGFR 44-51 03/26/2017 Creatinine 1.08, BUN 18, Potassium 4.0, Sodium 138, EGFR 52-60 11/20/2016 Creatinine 1.24, BUN 14, Potassium 4.6, Sodium 136, EGFR 44-51 10/07/2016 Creatinine 1.23, BUN 16, Potassium 4.2, Sodium 136, eGFR 44-51 08/04/2016 Creatinine 1.15, BUN 14, Potassium 4.6, Sodium 136 09/10/2015 Creatinine 1.06, BUN 16, Potassium 4.4, Sodium 136 02/27/2015 Creatinine 1.13, BUN 18, Potassium 3.9, Sodium 135  Recommendations: No changes.   Encouraged to call for fluid symptoms.  Follow-up plan: ICM clinic phone appointment on 11/01/2017.  Office appointment scheduled 10/05/2017 with Dr. Johnsie Cancel.  Copy of ICM check sent to Dr. Rayann Heman.   3 month ICM trend: 09/27/2017   1 Year ICM trend:      Rosalene Billings, RN 09/28/2017 8:26 AM
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
## 2017-10-05 ENCOUNTER — Other Ambulatory Visit (HOSPITAL_COMMUNITY): Payer: Self-pay | Admitting: Pharmacist

## 2017-10-05 ENCOUNTER — Ambulatory Visit (INDEPENDENT_AMBULATORY_CARE_PROVIDER_SITE_OTHER): Payer: Medicare Other | Admitting: Cardiovascular Disease

## 2017-10-05 ENCOUNTER — Encounter: Payer: Self-pay | Admitting: Cardiovascular Disease

## 2017-10-05 ENCOUNTER — Encounter (INDEPENDENT_AMBULATORY_CARE_PROVIDER_SITE_OTHER): Payer: Self-pay

## 2017-10-05 VITALS — BP 106/72 | HR 93 | Ht 63.5 in | Wt 132.4 lb

## 2017-10-05 DIAGNOSIS — I6523 Occlusion and stenosis of bilateral carotid arteries: Secondary | ICD-10-CM | POA: Diagnosis not present

## 2017-10-05 DIAGNOSIS — I5022 Chronic systolic (congestive) heart failure: Secondary | ICD-10-CM

## 2017-10-05 DIAGNOSIS — I10 Essential (primary) hypertension: Secondary | ICD-10-CM

## 2017-10-05 MED ORDER — SACUBITRIL-VALSARTAN 49-51 MG PO TABS: 1.0000 | ORAL_TABLET | Freq: Two times a day (BID) | ORAL | 3 refills | Status: DC

## 2017-10-05 NOTE — Patient Instructions (Addendum)
Medication Instructions:  Your physician recommends that you continue on your current medications as directed. Please refer to the Current Medication list given to you today.  Labwork: NONE  Testing/Procedures: Your physician has requested that you have a carotid duplex. This test is an ultrasound of the carotid arteries in your neck. It looks at blood flow through these arteries that supply the brain with blood. Allow one hour for this exam. There are no restrictions or special instructions.  Follow-Up: Your physician wants you to follow-up in: 6 months with Dr. Johnsie Cancel. You will receive a reminder letter in the mail two months in advance. If you don't receive a letter, please call our office to schedule the follow-up appointment.   If you need a refill on your cardiac medications before your next appointment, please call your pharmacy.

## 2017-10-08 ENCOUNTER — Encounter (HOSPITAL_COMMUNITY): Payer: Medicare Other

## 2017-10-18 ENCOUNTER — Inpatient Hospital Stay (HOSPITAL_COMMUNITY): Admission: RE | Admit: 2017-10-18 | Payer: Medicare Other | Source: Ambulatory Visit

## 2017-11-01 ENCOUNTER — Ambulatory Visit (INDEPENDENT_AMBULATORY_CARE_PROVIDER_SITE_OTHER): Payer: Medicare Other

## 2017-11-01 ENCOUNTER — Telehealth: Payer: Self-pay

## 2017-11-01 DIAGNOSIS — Z95810 Presence of automatic (implantable) cardiac defibrillator: Secondary | ICD-10-CM

## 2017-11-01 DIAGNOSIS — I5022 Chronic systolic (congestive) heart failure: Secondary | ICD-10-CM

## 2017-11-01 NOTE — Telephone Encounter (Signed)
Remote ICM transmission received.  Attempted call to patient and left detailed message per DPR regarding transmission and next ICM scheduled for 12/02/2017.  Advised to return call for any fluid symptoms or questions.

## 2017-11-01 NOTE — Progress Notes (Signed)
EPIC Encounter for ICM Monitoring  Patient Name: [name] is a 66 y.o. female Date: 11/01/2017 Primary Care Physican: [place] [place] [facility] Primary Cardiologist: Nishan/Bensimhon Electrophysiologist: Allred Dry Weight:Previous weight 128lbs         Attempted call to patient and unable to reach.  Left detailed message regarding transmission.  Transmission reviewed.    Thoracic impedance normal but was abnormal suggesting fluid accumulation since 10/07/2017 with few days at baseline including today.  Prescribed dosage: Furosemide 20 mg 2 tablets (40 mg total) by mouth daily. May take additional 2 tablets as needed for weight gain/swelling  Labs: 07/30/2017 Creatinine 1.24, BUN 14, Potassium 4.6, Sodium 136, EGFR 44-51 03/26/2017 Creatinine 1.08, BUN 18, Potassium 4.0, Sodium 138, EGFR 52-60 11/20/2016 Creatinine 1.24, BUN 14, Potassium 4.6, Sodium 136, EGFR 44-51 10/07/2016 Creatinine 1.23, BUN 16, Potassium 4.2, Sodium 136, eGFR 44-51 08/04/2016 Creatinine 1.15, BUN 14, Potassium 4.6, Sodium 136 09/10/2015 Creatinine 1.06, BUN 16, Potassium 4.4, Sodium 136 02/27/2015 Creatinine 1.13, BUN 18, Potassium 3.9, Sodium 135  Recommendations: Left voice mail with ICM number and encouraged to call if experiencing any fluid symptoms.  Follow-up plan: ICM clinic phone appointment on 12/02/2017.  Office appointment scheduled 12/27/2017 with Dr. Haroldine Laws.  Copy of ICM check sent to Dr. Rayann Heman and Dr. Haroldine Laws.   3 month ICM trend: 11/01/2017    1 Year ICM trend:       Rosalene Billings, RN 11/01/2017 12:11 PM

## 2017-11-18 ENCOUNTER — Telehealth (HOSPITAL_COMMUNITY): Payer: Self-pay | Admitting: Pharmacist

## 2017-11-18 NOTE — Telephone Encounter (Signed)
[employer] patient assistance approved for Entresto 49-51 mg BID through 12/06/17.   Ruta Hinds. Velva Harman, PharmD, BCPS, CPP Clinical Pharmacist Pager: [phone] Phone: [phone] 11/18/2017 12:28 PM

## 2017-11-22 ENCOUNTER — Other Ambulatory Visit (HOSPITAL_COMMUNITY): Payer: Self-pay | Admitting: Cardiology

## 2017-11-26 ENCOUNTER — Ambulatory Visit (HOSPITAL_COMMUNITY)
Admission: RE | Admit: 2017-11-26 | Discharge: 2017-11-26 | Disposition: A | Discharge status: Home / Self Care | Payer: Medicare Other | Source: Ambulatory Visit | Attending: Internal Medicine | Admitting: Internal Medicine

## 2017-11-26 ENCOUNTER — Encounter (HOSPITAL_COMMUNITY): Payer: Self-pay | Admitting: Internal Medicine

## 2017-11-26 VITALS — BP 110/48 | HR 85 | Wt 131.0 lb

## 2017-11-26 DIAGNOSIS — I739 Peripheral vascular disease, unspecified: Secondary | ICD-10-CM | POA: Diagnosis not present

## 2017-11-26 DIAGNOSIS — Z888 Allergy status to other drugs, medicaments and biological substances status: Secondary | ICD-10-CM | POA: Diagnosis not present

## 2017-11-26 DIAGNOSIS — I6523 Occlusion and stenosis of bilateral carotid arteries: Secondary | ICD-10-CM | POA: Diagnosis not present

## 2017-11-26 DIAGNOSIS — Z95810 Presence of automatic (implantable) cardiac defibrillator: Secondary | ICD-10-CM | POA: Diagnosis not present

## 2017-11-26 DIAGNOSIS — I252 Old myocardial infarction: Secondary | ICD-10-CM | POA: Diagnosis not present

## 2017-11-26 DIAGNOSIS — K449 Diaphragmatic hernia without obstruction or gangrene: Secondary | ICD-10-CM | POA: Diagnosis not present

## 2017-11-26 DIAGNOSIS — Z79899 Other long term (current) drug therapy: Secondary | ICD-10-CM | POA: Diagnosis not present

## 2017-11-26 DIAGNOSIS — I272 Pulmonary hypertension, unspecified: Secondary | ICD-10-CM | POA: Diagnosis not present

## 2017-11-26 DIAGNOSIS — R001 Bradycardia, unspecified: Secondary | ICD-10-CM | POA: Insufficient documentation

## 2017-11-26 DIAGNOSIS — F17210 Nicotine dependence, cigarettes, uncomplicated: Secondary | ICD-10-CM | POA: Diagnosis not present

## 2017-11-26 DIAGNOSIS — F329 Major depressive disorder, single episode, unspecified: Secondary | ICD-10-CM | POA: Diagnosis not present

## 2017-11-26 DIAGNOSIS — Z720 Tobacco use: Secondary | ICD-10-CM

## 2017-11-26 DIAGNOSIS — J449 Chronic obstructive pulmonary disease, unspecified: Secondary | ICD-10-CM | POA: Insufficient documentation

## 2017-11-26 DIAGNOSIS — I2510 Atherosclerotic heart disease of native coronary artery without angina pectoris: Secondary | ICD-10-CM | POA: Diagnosis not present

## 2017-11-26 DIAGNOSIS — I5022 Chronic systolic (congestive) heart failure: Secondary | ICD-10-CM | POA: Insufficient documentation

## 2017-11-26 DIAGNOSIS — E785 Hyperlipidemia, unspecified: Secondary | ICD-10-CM | POA: Diagnosis not present

## 2017-11-26 DIAGNOSIS — Z7982 Long term (current) use of aspirin: Secondary | ICD-10-CM | POA: Insufficient documentation

## 2017-11-26 DIAGNOSIS — I255 Ischemic cardiomyopathy: Secondary | ICD-10-CM | POA: Insufficient documentation

## 2017-11-26 LAB — BASIC METABOLIC PANEL
ANION GAP: 7 (ref 5–15)
BUN: 16 mg/dL (ref 6–20)
CHLORIDE: 101 mmol/L (ref 101–111)
CO2: 26 mmol/L (ref 22–32)
Calcium: 9.6 mg/dL (ref 8.9–10.3)
Creatinine, Ser: 1.08 mg/dL — ABNORMAL HIGH (ref 0.44–1.00)
GFR, EST AFRICAN AMERICAN: 60 mL/min — AB (ref >60)
GFR, EST NON AFRICAN AMERICAN: 52 mL/min — AB (ref >60)
Glucose, Bld: 94 mg/dL (ref 65–99)
POTASSIUM: 4.6 mmol/L (ref 3.5–5.1)
SODIUM: 134 mmol/L — AB (ref 135–145)

## 2017-11-26 NOTE — Patient Instructions (Signed)
Labs drawn today (if we do not call you, then your lab work was stable)   Your physician has requested that you have an echocardiogram. Echocardiography is a painless test that uses sound waves to create images of your heart. It provides your doctor with information about the size and shape of your heart and how well your heart's chambers and valves are working. This procedure takes approximately one hour. There are no restrictions for this procedure.   Your physician recommends that you schedule a follow-up appointment in: 4 months with Dr. McLean an a echocardiogram    

## 2017-11-26 NOTE — Addendum Note (Signed)
Encounter addended by: Shirley Muscat, RN on: 11/26/2017 10:56 AM  Actions taken: Visit diagnoses modified, Order list changed, Diagnosis association updated, Sign clinical note

## 2017-11-26 NOTE — Progress Notes (Signed)
Advanced Heart Failure Note   Patient ID: [name], female   DOB: 12/17/1950, 66 y.o.   MRN: [id number]  Date:  11/26/2017   ID:  [name], DOB 1951-06-14, MRN [id number]  PCP:  [name] in [place] Electrophysiologist:  Dr. Thompson Grayer  General Cardiologist: Dr Johnsie Cancel HF: Dr Haroldine Laws   History of Present Illness: [name] is a 66 y.o. female who was referred to the HF Clinic for evaluation for advanced therapies.   She has a hx of CAD, s/p prior Ant MI, s/p prior POBA to LAD, Dx, RCA, CHF due to ICM with high scar burden by MRI (12/09), s/p ICD, HTN, HL, depression, tobacco abuse.   ABI 7/14 R 0.93 L 0.95 MRI (12/09):  High scar burden, Inf and Apical AK, EF 24%.  Echo 10/14 EF 15% global HK. RV mild HK  Mod MR. Severe TR. PA 31mmHG   Lexiscan Myoview (11/14): LV Ejection Fraction: 24%. Extensive scar in the inferior wall, apex and anterior wall. Minimal reversibility Carotid Doppler : August 2015 showed 60-79% bilateral ICA stenosis. No previous CVA. Cath 07/26/14 Left main: Calcified 40% mid to distal stenosis LAD: Long vessel wrapping the apex 20-30% plaque in midsection. Distal vessel is small.  LCX: Large OM-1 and large OM-2. 50% lesion in proximal OM-2 RCA: Dominant vessel. Calcified, eccentric 50-60% lesion in midsection . LV-gram done in the RAO projection: Ejection fraction = 25% Severe global HK of mid to distal LV. No MR.    CPX 11/14 FVC 2.08 (67%)  FEV1 1.58 (66%)  FEV1/FVC 76%  Resting HR: 84 Peak HR: 142 (91% age predicted max HR) BP rest: 114/60 BP peak: 148/70 (IPE) Peak VO2: 15.0 (63% predicted peak VO2) VE/VCO2 slope: 32 OUES: 0.85 Peak RER: 1.33 Ventilatory Threshold: 12.1 (50.8% predicted peak VO2) VE/MVV: 60.1% O2pulse: 6 (75% predicted O2pulse)  She presents today for regular follow up. No changes to medications at last visit due to low BP. Says she is feeling pretty good. Does all activities without too much  problem. Left leg claudication had progressed some but says it doesn't limit her too much. Saw Dr. Aida Raider who is following. Gets occasional CP in very stressful events. Still smoking 6 cigarettes per day. Taking lasix 40 mg daily. Takes extra as needed. Occasionally lightheaded but no presyncope.    Optivol personally reviewed in clinic: Fluid index was up in November but now back down. No VT/AF. Pt activity > 4 hrs daily.  Echo 09/17/16 :LVEF 25-30%, Grade 1  Labs (10/12):  K 3.2, creatinine 0.9, Hgb 13.5 Labs (9/14):    K 3.4, creatinine 1.0, pro BNP 1560, Hgb 11.8, HDL 25, LDL 60, ALT 29 Labs (10/14):  K 3.3=> 4.2, creatinine 1.2, BNP 1175 Labs (10/02/13) K 3.2 creatinine 0.9 Labs (12/13/13): LDL 134, AST 24, ALT 24, TC 221, TG 104 Labs (8/15): K 5.1, creatinine 1.1  Labs (11/15): LDL 79, HDL 67 Labs (3/16): K 3.9, creatinine 1.13  Wt Readings from Last 3 Encounters:  11/26/17 131 lb (59.4 kg)  10/05/17 132 lb 6.4 oz (60.1 kg)  08/24/17 129 lb 6.4 oz (58.7 kg)     Past Medical History:  Diagnosis Date  . Arthritis    hands  . Asthma   . CAD (coronary artery disease)    a. s/p prior Ant MI, b. s/p prior POBA to LAD, Dx, RCA,;  c.  LHC (10/12):  dLAD 40, pCFX 30, OM1 50, pRCA 30;  d.  Lexiscan Myoview (11/14):  High risk study; inferior, anterior, apical defects; minimal reversibility toward the apex; consistent with prior infarct and very mild peri-infarct ischemia, EF 24%, inferior/apical/anterior akinesis  . Chronic systolic heart failure ([place])   . Depression   . H/O hiatal hernia   . HLD (hyperlipidemia)   . HTN (hypertension)   . Ischemic cardiomyopathy    MRI (12/09):  High scar burden, Inf and Apical AK, EF 24%.;  Echo (10/14): EF 15%, Gr 2 DD, mild to mod MR, mod LAE, RVSF mildly reduced, mod RAE, severe TR, PASP 49-53 (mod pulmonary HTN)  . MI (myocardial infarction) ([place])   . Tobacco abuse     Current Outpatient Medications  Medication Sig Dispense Refill  .  acetaminophen (TYLENOL ARTHRITIS PAIN) 650 MG CR tablet Take 650 mg by mouth every 8 (eight) hours as needed for pain.    Marland Kitchen aspirin 81 MG tablet Take 81 mg by mouth daily.      Marland Kitchen azelastine (ASTELIN) 0.1 % nasal spray Place 1 spray into both nostrils daily. Use in each nostril as directed     . busPIRone (BUSPAR) 5 MG tablet Take 5 mg by mouth 2 (two) times daily as needed for anxiety.    Marland Kitchen ezetimibe (ZETIA) 10 MG tablet TAKE 1/2 TABLET TWICE A WEEK 5 tablet 11  . fexofenadine (ALLEGRA) 180 MG tablet Take 180 mg by mouth daily as needed for allergies.     . fish oil-omega-3 fatty acids 1000 MG capsule Take 2 g by mouth daily.     . fluticasone furoate-vilanterol (BREO ELLIPTA) 100-25 MCG/INH AEPB Inhale 1 puff into the lungs daily as needed for shortness of breath or wheezing.    . furosemide (LASIX) 20 MG tablet TAKE 2 TABLETS BY MOUTH DAILY. MAY TAKE ADDITIONAL 2 TABS AS NEEDED FOR SWELLING/ SHORT OF BREATH 120 tablet 3  . isosorbide mononitrate (IMDUR) 30 MG 24 hr tablet Take 30 mg by mouth daily.    . NON FORMULARY OJC, organic juice cleanse dietary supplement, taking by mouth every other day    . pantoprazole (PROTONIX) 40 MG tablet Take 40 mg by mouth daily.    Marland Kitchen PARoxetine (PAXIL) 30 MG tablet Take 30 mg by mouth daily.     . polyethylene glycol (MIRALAX / GLYCOLAX) packet Take 17 g by mouth at bedtime.    . rosuvastatin (CRESTOR) 5 MG tablet TAKE 1 TABLET BY MOUTH EVERY DAY ON MONDAY, WEDNESDAY,AND FRIDAY 30 tablet 6  . sacubitril-valsartan (ENTRESTO) 49-51 MG Take 1 tablet by mouth 2 (two) times daily. 180 tablet 3  . spironolactone (ALDACTONE) 25 MG tablet Take 1 tablet (25 mg total) by mouth at bedtime. 30 tablet 4  . spironolactone (ALDACTONE) 25 MG tablet TAKE 1 TABLET BY MOUTH EVERY DAY 30 tablet 4  . nitroGLYCERIN (NITROSTAT) 0.4 MG SL tablet Place 1 tablet (0.4 mg total) under the tongue every 5 (five) minutes as needed for chest pain. 25 tablet 3   No current  facility-administered medications for this encounter.     Allergies:    Allergies  Allergen Reactions  . Zebeta [Bisoprolol Fumarate]     Pt states she couldn't think and it messed her head up  . Statins Other (See Comments)    Gradually tired with daily Lipitor, made pt feel bad (Pt can take Crestor 5 mg 4 times per week and Zetia 5 mg every other day)    Social History:  The patient  reports that she has  been smoking cigarettes.  she has never used smokeless tobacco. She reports that she does not drink alcohol or use drugs.   Review of systems complete and found to be negative unless listed in HPI.    Vitals:   11/26/17 1027  BP: (!) 110/48  Pulse: 85  SpO2: 99%  Weight: 131 lb (59.4 kg)   Orthostatics Sitting      108-84 Standing  102/76  Wt Readings from Last 3 Encounters:  11/26/17 131 lb (59.4 kg)  10/05/17 132 lb 6.4 oz (60.1 kg)  08/24/17 129 lb 6.4 oz (58.7 kg)    PHYSICAL EXAM:  General:  Well appearing. No resp difficulty HEENT: normal Neck: supple. no JVD. Carotids 2+ bilat; no bruits. No lymphadenopathy or thryomegaly appreciated. Cor: PMI nondisplaced. Regular rate & rhythm. No rubs, gallops or murmurs. Lungs: clear with decreased BS throughout  Abdomen: soft, nontender, nondistended. No hepatosplenomegaly. No bruits or masses. Good bowel sounds. Extremities: no cyanosis, clubbing, rash, edema Neuro: alert & orientedx3, cranial nerves grossly intact. moves all 4 extremities w/o difficulty. Affect pleasant   ASSESSMENT AND PLAN:  1. CAD s/p previous anterior MI:  Cath 8/15 nonobstructive CAD.   -  Occasional CP in high-stress situations.stable pattern.  - Continue ASA and statin.  - Followed by Dr. Johnsie Cancel  2.  Chronic Systolic CHF  due to [facility] with mild RV dysfunction s/p Med[employer] ICD. Echo 10/17 EF 25-30%. No VT.   - Stable NYHA class II symptoms.  - Volume status stable on exam and by ICD interrogation (done personally). Continue lasix 40 mg daily.  Can take extra as needed.  - Continue entresto 49/51 mg BID. No room to up-titrate. With soft BP.  - Continue spironolactone 25 mg daily.  Check BMET today - Does not tolerate beta blockers due to symptomatic brady in past. - Reinforced fluid restriction to < 2 L daily, sodium restriction to less than 2000 mg daily, and the importance of daily weights.   3. COPD with ongoing tobacco abuse:  - Encouraged to stop smoking completely.  4. PAD with Intermittent claudication: Medical therapy. No change.  - Moderate bilateral calf claudication worse on the left side due to known SFA disease. This has progressed over past few months   - Saw Dr. Fletcher Anon recently. Continue medical f/u. Counseled on need to stop smoking completely.  5. Bilateral carotid stenosis - Followed by Dr. Johnsie Cancel  6. H/o symptomatic bradycardia:  - Stable off BB. Will not rechallenge.  7. Hyperlipidemia with intolerance of multiple cholesterol meds: Tolerating Crestor 5 mg three times/week and Zetia 5mg   two times a week.  - Per PCP.  8. Depression/Anxiety:  - Stable.   Glori Bickers, MD  11/26/17

## 2017-11-26 NOTE — Addendum Note (Signed)
Encounter addended by: Shirley Muscat, RN on: 11/26/2017 11:31 AM  Actions taken: Sign clinical note

## 2017-11-26 NOTE — Progress Notes (Signed)
Medication Samples have been provided to the patient.  Drug name: Delene Loll       Strength: 49/51        Qty: 2  LOT: [id number]  Exp.Date: 1/21  Dosing instructions: Take 1 tablet twice a day   The patient has been instructed regarding the correct time, dose, and frequency of taking this medication, including desired effects and most common side effects.   Shirley Muscat 11:26 AM 11/26/2017

## 2017-12-02 ENCOUNTER — Ambulatory Visit (INDEPENDENT_AMBULATORY_CARE_PROVIDER_SITE_OTHER): Payer: Medicare Other

## 2017-12-02 DIAGNOSIS — I5022 Chronic systolic (congestive) heart failure: Secondary | ICD-10-CM

## 2017-12-02 DIAGNOSIS — Z95810 Presence of automatic (implantable) cardiac defibrillator: Secondary | ICD-10-CM | POA: Diagnosis not present

## 2017-12-03 NOTE — Progress Notes (Signed)
EPIC Encounter for ICM Monitoring  Patient Name: [name] is a 66 y.o. female Date: 12/03/2017 Primary Care Physican: [place], [place] At Primary Cardiologist: Nishan/Bensimhon Electrophysiologist: Allred Dry Weight:128lbs      Heart Failure questions reviewed, pt asymptomatic.   Thoracic impedance normal.  Prescribed dosage: Furosemide 20 mg 2 tablets (40 mg total) by mouth daily. May take additional 2 tablets as needed for weight gain/swelling  Labs: 11/26/2017 Creatinine 1.08, BUN 16, Potassium 4.6, Sodium 134, EGFR 52-60 07/30/2017 Creatinine 1.24, BUN 14, Potassium 4.6, Sodium 136, EGFR 44-51 03/26/2017 Creatinine 1.08, BUN 18, Potassium 4.0, Sodium 138, EGFR 52-60 11/20/2016 Creatinine 1.24, BUN 14, Potassium 4.6, Sodium 136, EGFR 44-51 10/07/2016 Creatinine 1.23, BUN 16, Potassium 4.2, Sodium 136, eGFR 44-51 08/04/2016 Creatinine 1.15, BUN 14, Potassium 4.6, Sodium 136 09/10/2015 Creatinine 1.06, BUN 16, Potassium 4.4, Sodium 136 02/27/2015 Creatinine 1.13, BUN 18, Potassium 3.9, Sodium 135  Recommendations: No changes.   Encouraged to call for fluid symptoms.  Follow-up plan: ICM clinic phone appointment on 01/03/2018.  Copy of ICM check sent to Dr. Rayann Heman.   3 month ICM trend: 12/03/2017    1 Year ICM trend:       Rosalene Billings, RN 12/03/2017 8:48 AM

## 2017-12-08 ENCOUNTER — Telehealth (HOSPITAL_COMMUNITY): Payer: Self-pay | Admitting: Pharmacist

## 2017-12-08 NOTE — Telephone Encounter (Signed)
PANF renewed so that [name] will have $1000 to use toward her Entresto copays through 10/06/18.   Member ID: [id number] Group ID: [id number] RxBin ID: [id number] PCN: [id number] Eligibility Start Date: 10/07/2017 Eligibility End Date: 10/06/2018 Assistance Amount: $1,000.00   Doroteo Bradford K. Velva Harman, PharmD, BCPS, CPP Clinical Pharmacist Pager: [phone] Phone: [phone] 12/08/2017 2:12 PM

## 2017-12-20 ENCOUNTER — Other Ambulatory Visit: Payer: Self-pay | Admitting: Internal Medicine

## 2018-01-03 ENCOUNTER — Ambulatory Visit (INDEPENDENT_AMBULATORY_CARE_PROVIDER_SITE_OTHER): Payer: Medicare Other

## 2018-01-03 ENCOUNTER — Telehealth: Payer: Self-pay

## 2018-01-03 DIAGNOSIS — I5022 Chronic systolic (congestive) heart failure: Secondary | ICD-10-CM | POA: Diagnosis not present

## 2018-01-03 DIAGNOSIS — Z95810 Presence of automatic (implantable) cardiac defibrillator: Secondary | ICD-10-CM

## 2018-01-03 NOTE — Telephone Encounter (Signed)
Remote ICM transmission received.  Attempted call to patient and left message to return call. 

## 2018-01-03 NOTE — Progress Notes (Signed)
EPIC Encounter for ICM Monitoring  Patient Name: [name] is a 67 y.o. female Date: 01/03/2018 Primary Care Physican: [place], [place] At Primary Cardiologist: Nishan/Bensimhon Electrophysiologist: Allred Dry Weight:Prior weight 128lbs       Attempted call to patient and unable to reach.    Transmission reviewed.    Thoracic impedance normal.  Prescribed dosage:  Furosemide 20 mg 2 tablets (40 mg total) by mouth daily. May take additional 2 tablets as needed for weight gain/swelling  Labs: 11/26/2017 Creatinine 1.08, BUN 16, Potassium 4.6, Sodium 134, EGFR 52-60 07/30/2017 Creatinine 1.24, BUN 14, Potassium 4.6, Sodium 136, EGFR 44-51 03/26/2017 Creatinine 1.08, BUN 18, Potassium 4.0, Sodium 138, EGFR 52-60 11/20/2016 Creatinine 1.24, BUN 14, Potassium 4.6, Sodium 136, EGFR 44-51 10/07/2016 Creatinine 1.23, BUN 16, Potassium 4.2, Sodium 136, eGFR 44-51 08/04/2016 Creatinine 1.15, BUN 14, Potassium 4.6, Sodium 136 09/10/2015 Creatinine 1.06, BUN 16, Potassium 4.4, Sodium 136 02/27/2015 Creatinine 1.13, BUN 18, Potassium 3.9, Sodium 135  Recommendations: NONE - Unable to reach.  Follow-up plan: ICM clinic phone appointment on 03/07/2018.  Office appointment scheduled 02/02/2018 with Dr. Rayann Heman.  Copy of ICM check sent to Dr. Rayann Heman.   3 month ICM trend: 01/03/2018    1 Year ICM trend:       Rosalene Billings, RN 01/03/2018 2:00 PM

## 2018-01-12 ENCOUNTER — Other Ambulatory Visit: Payer: Self-pay | Admitting: Physician Assistant

## 2018-01-12 ENCOUNTER — Ambulatory Visit
Admission: RE | Admit: 2018-01-12 | Discharge: 2018-01-12 | Disposition: A | Discharge status: Home / Self Care | Payer: Medicare Other | Source: Ambulatory Visit | Attending: Physician Assistant | Admitting: Physician Assistant

## 2018-01-12 DIAGNOSIS — R062 Wheezing: Secondary | ICD-10-CM

## 2018-01-12 DIAGNOSIS — R05 Cough: Secondary | ICD-10-CM

## 2018-01-12 DIAGNOSIS — R059 Cough, unspecified: Secondary | ICD-10-CM

## 2018-01-12 DIAGNOSIS — R0602 Shortness of breath: Secondary | ICD-10-CM

## 2018-01-14 ENCOUNTER — Emergency Department (HOSPITAL_COMMUNITY): Payer: Medicare Other

## 2018-01-14 ENCOUNTER — Encounter (HOSPITAL_COMMUNITY): Payer: Self-pay | Admitting: Emergency Medicine

## 2018-01-14 ENCOUNTER — Inpatient Hospital Stay (HOSPITAL_COMMUNITY)
Admission: EM | Admit: 2018-01-14 | Discharge: 2018-01-17 | DRG: 291 | Disposition: A | Discharge status: Home / Self Care | Payer: Medicare Other | Attending: Internal Medicine | Admitting: Internal Medicine

## 2018-01-14 DIAGNOSIS — I130 Hypertensive heart and chronic kidney disease with heart failure and stage 1 through stage 4 chronic kidney disease, or unspecified chronic kidney disease: Secondary | ICD-10-CM | POA: Diagnosis not present

## 2018-01-14 DIAGNOSIS — J438 Other emphysema: Secondary | ICD-10-CM

## 2018-01-14 DIAGNOSIS — J441 Chronic obstructive pulmonary disease with (acute) exacerbation: Secondary | ICD-10-CM | POA: Diagnosis present

## 2018-01-14 DIAGNOSIS — R0600 Dyspnea, unspecified: Secondary | ICD-10-CM | POA: Diagnosis present

## 2018-01-14 DIAGNOSIS — Z7982 Long term (current) use of aspirin: Secondary | ICD-10-CM | POA: Diagnosis not present

## 2018-01-14 DIAGNOSIS — I252 Old myocardial infarction: Secondary | ICD-10-CM

## 2018-01-14 DIAGNOSIS — Z720 Tobacco use: Secondary | ICD-10-CM | POA: Diagnosis present

## 2018-01-14 DIAGNOSIS — J440 Chronic obstructive pulmonary disease with acute lower respiratory infection: Secondary | ICD-10-CM | POA: Diagnosis present

## 2018-01-14 DIAGNOSIS — I2510 Atherosclerotic heart disease of native coronary artery without angina pectoris: Secondary | ICD-10-CM | POA: Diagnosis present

## 2018-01-14 DIAGNOSIS — J9601 Acute respiratory failure with hypoxia: Secondary | ICD-10-CM | POA: Diagnosis present

## 2018-01-14 DIAGNOSIS — I255 Ischemic cardiomyopathy: Secondary | ICD-10-CM | POA: Diagnosis present

## 2018-01-14 DIAGNOSIS — I248 Other forms of acute ischemic heart disease: Secondary | ICD-10-CM | POA: Diagnosis present

## 2018-01-14 DIAGNOSIS — I5043 Acute on chronic combined systolic (congestive) and diastolic (congestive) heart failure: Secondary | ICD-10-CM | POA: Diagnosis present

## 2018-01-14 DIAGNOSIS — E785 Hyperlipidemia, unspecified: Secondary | ICD-10-CM | POA: Diagnosis present

## 2018-01-14 DIAGNOSIS — J209 Acute bronchitis, unspecified: Secondary | ICD-10-CM | POA: Diagnosis present

## 2018-01-14 DIAGNOSIS — Z79899 Other long term (current) drug therapy: Secondary | ICD-10-CM

## 2018-01-14 DIAGNOSIS — J449 Chronic obstructive pulmonary disease, unspecified: Secondary | ICD-10-CM | POA: Diagnosis present

## 2018-01-14 DIAGNOSIS — I340 Nonrheumatic mitral (valve) insufficiency: Secondary | ICD-10-CM | POA: Diagnosis not present

## 2018-01-14 DIAGNOSIS — J101 Influenza due to other identified influenza virus with other respiratory manifestations: Secondary | ICD-10-CM | POA: Diagnosis present

## 2018-01-14 DIAGNOSIS — R0902 Hypoxemia: Secondary | ICD-10-CM | POA: Diagnosis not present

## 2018-01-14 DIAGNOSIS — N183 Chronic kidney disease, stage 3 (moderate): Secondary | ICD-10-CM | POA: Diagnosis present

## 2018-01-14 DIAGNOSIS — I10 Essential (primary) hypertension: Secondary | ICD-10-CM | POA: Diagnosis present

## 2018-01-14 DIAGNOSIS — F17210 Nicotine dependence, cigarettes, uncomplicated: Secondary | ICD-10-CM | POA: Diagnosis present

## 2018-01-14 DIAGNOSIS — Z95810 Presence of automatic (implantable) cardiac defibrillator: Secondary | ICD-10-CM | POA: Diagnosis not present

## 2018-01-14 DIAGNOSIS — I509 Heart failure, unspecified: Secondary | ICD-10-CM

## 2018-01-14 DIAGNOSIS — F329 Major depressive disorder, single episode, unspecified: Secondary | ICD-10-CM | POA: Diagnosis present

## 2018-01-14 DIAGNOSIS — R748 Abnormal levels of other serum enzymes: Secondary | ICD-10-CM | POA: Diagnosis not present

## 2018-01-14 LAB — BASIC METABOLIC PANEL
Anion gap: 11 (ref 5–15)
BUN: 17 mg/dL (ref 6–20)
CO2: 23 mmol/L (ref 22–32)
Calcium: 9.4 mg/dL (ref 8.9–10.3)
Chloride: 100 mmol/L — ABNORMAL LOW (ref 101–111)
Creatinine, Ser: 1.19 mg/dL — ABNORMAL HIGH (ref 0.44–1.00)
GFR calc Af Amer: 53 mL/min — ABNORMAL LOW (ref >60)
GFR calc non Af Amer: 46 mL/min — ABNORMAL LOW (ref >60)
Glucose, Bld: 117 mg/dL — ABNORMAL HIGH (ref 65–99)
Potassium: 4.1 mmol/L (ref 3.5–5.1)
Sodium: 134 mmol/L — ABNORMAL LOW (ref 135–145)

## 2018-01-14 LAB — RESPIRATORY PANEL BY PCR
Adenovirus: NOT DETECTED
Bordetella pertussis: NOT DETECTED
Chlamydophila pneumoniae: NOT DETECTED
Coronavirus 229E: NOT DETECTED
Coronavirus HKU1: NOT DETECTED
Coronavirus NL63: NOT DETECTED
Coronavirus OC43: NOT DETECTED
Influenza A H1 2009: NOT DETECTED
Influenza A H1: NOT DETECTED
Influenza A H3: DETECTED — AB
Influenza A: NOT DETECTED
Influenza B: NOT DETECTED
Metapneumovirus: NOT DETECTED
Mycoplasma pneumoniae: NOT DETECTED
Parainfluenza Virus 1: NOT DETECTED
Parainfluenza Virus 2: NOT DETECTED
Parainfluenza Virus 3: NOT DETECTED
Parainfluenza Virus 4: NOT DETECTED
Respiratory Syncytial Virus: NOT DETECTED
Rhinovirus / Enterovirus: NOT DETECTED

## 2018-01-14 LAB — BRAIN NATRIURETIC PEPTIDE: B Natriuretic Peptide: 2051.6 pg/mL — ABNORMAL HIGH (ref 0.0–100.0)

## 2018-01-14 LAB — CBC WITH DIFFERENTIAL/PLATELET
Basophils Absolute: 0 10*3/uL (ref 0.0–0.1)
Basophils Relative: 0 %
Eosinophils Absolute: 0 10*3/uL (ref 0.0–0.7)
Eosinophils Relative: 0 %
HCT: 34.2 % — ABNORMAL LOW (ref 36.0–46.0)
Hemoglobin: 11.9 g/dL — ABNORMAL LOW (ref 12.0–15.0)
Lymphocytes Relative: 5 %
Lymphs Abs: 0.5 10*3/uL — ABNORMAL LOW (ref 0.7–4.0)
MCH: 32.8 pg (ref 26.0–34.0)
MCHC: 34.8 g/dL (ref 30.0–36.0)
MCV: 94.2 fL (ref 78.0–100.0)
Monocytes Absolute: 0.9 10*3/uL (ref 0.1–1.0)
Monocytes Relative: 8 %
Neutro Abs: 9.2 10*3/uL — ABNORMAL HIGH (ref 1.7–7.7)
Neutrophils Relative %: 87 %
Platelets: 169 10*3/uL (ref 150–400)
RBC: 3.63 MIL/uL — ABNORMAL LOW (ref 3.87–5.11)
RDW: 13.4 % (ref 11.5–15.5)
WBC: 10.6 10*3/uL — ABNORMAL HIGH (ref 4.0–10.5)

## 2018-01-14 LAB — TROPONIN I: TROPONIN I: 0.25 ng/mL — AB (ref <0.03)

## 2018-01-14 MED ORDER — CITALOPRAM HYDROBROMIDE 20 MG PO TABS
20.0000 mg | ORAL_TABLET | Freq: Every day | ORAL | Status: DC
  Administered 2018-01-15 – 2018-01-17 (×3): 20 mg via ORAL
  Filled 2018-01-14 (×3): qty 1

## 2018-01-14 MED ORDER — ALBUTEROL SULFATE (2.5 MG/3ML) 0.083% IN NEBU
2.5000 mg | INHALATION_SOLUTION | RESPIRATORY_TRACT | Status: DC | PRN
  Administered 2018-01-14: 2.5 mg via RESPIRATORY_TRACT
  Filled 2018-01-14: qty 3

## 2018-01-14 MED ORDER — ALBUTEROL (5 MG/ML) CONTINUOUS INHALATION SOLN
10.0000 mg/h | INHALATION_SOLUTION | RESPIRATORY_TRACT | Status: DC
  Administered 2018-01-14: 10 mg/h via RESPIRATORY_TRACT
  Filled 2018-01-14: qty 20

## 2018-01-14 MED ORDER — ISOSORBIDE MONONITRATE ER 30 MG PO TB24
30.0000 mg | ORAL_TABLET | Freq: Every day | ORAL | Status: DC
  Administered 2018-01-15 – 2018-01-17 (×3): 30 mg via ORAL
  Filled 2018-01-14 (×3): qty 1

## 2018-01-14 MED ORDER — PANTOPRAZOLE SODIUM 40 MG PO TBEC
40.0000 mg | DELAYED_RELEASE_TABLET | Freq: Every day | ORAL | Status: DC
  Administered 2018-01-15 – 2018-01-17 (×3): 40 mg via ORAL
  Filled 2018-01-14 (×3): qty 1

## 2018-01-14 MED ORDER — METHYLPREDNISOLONE SODIUM SUCC 125 MG IJ SOLR
125.0000 mg | Freq: Once | INTRAMUSCULAR | Status: AC
  Administered 2018-01-14: 125 mg via INTRAVENOUS
  Filled 2018-01-14: qty 2

## 2018-01-14 MED ORDER — ENOXAPARIN SODIUM 40 MG/0.4ML ~~LOC~~ SOLN
40.0000 mg | SUBCUTANEOUS | Status: DC
  Administered 2018-01-14 – 2018-01-15 (×2): 40 mg via SUBCUTANEOUS
  Filled 2018-01-14 (×2): qty 0.4

## 2018-01-14 MED ORDER — IPRATROPIUM-ALBUTEROL 0.5-2.5 (3) MG/3ML IN SOLN
3.0000 mL | Freq: Four times a day (QID) | RESPIRATORY_TRACT | Status: DC
  Administered 2018-01-15 – 2018-01-16 (×8): 3 mL via RESPIRATORY_TRACT
  Filled 2018-01-14 (×10): qty 3

## 2018-01-14 MED ORDER — ASPIRIN EC 81 MG PO TBEC
81.0000 mg | DELAYED_RELEASE_TABLET | Freq: Every day | ORAL | Status: DC
  Administered 2018-01-15 – 2018-01-17 (×3): 81 mg via ORAL
  Filled 2018-01-14 (×3): qty 1

## 2018-01-14 MED ORDER — SODIUM CHLORIDE 0.9% FLUSH
3.0000 mL | Freq: Two times a day (BID) | INTRAVENOUS | Status: DC
  Administered 2018-01-14 – 2018-01-17 (×6): 3 mL via INTRAVENOUS

## 2018-01-14 MED ORDER — LORATADINE 10 MG PO TABS
10.0000 mg | ORAL_TABLET | Freq: Every day | ORAL | Status: DC
  Administered 2018-01-14 – 2018-01-17 (×4): 10 mg via ORAL
  Filled 2018-01-14 (×4): qty 1

## 2018-01-14 MED ORDER — AZELASTINE HCL 0.1 % NA SOLN
1.0000 | Freq: Every day | NASAL | Status: DC
  Administered 2018-01-15 – 2018-01-17 (×3): 1 via NASAL
  Filled 2018-01-14: qty 30

## 2018-01-14 MED ORDER — ALBUTEROL SULFATE (2.5 MG/3ML) 0.083% IN NEBU
5.0000 mg | INHALATION_SOLUTION | Freq: Once | RESPIRATORY_TRACT | Status: AC
  Administered 2018-01-14: 5 mg via RESPIRATORY_TRACT
  Filled 2018-01-14: qty 6

## 2018-01-14 MED ORDER — SPIRONOLACTONE 25 MG PO TABS
25.0000 mg | ORAL_TABLET | Freq: Every day | ORAL | Status: DC
  Administered 2018-01-15 – 2018-01-17 (×3): 25 mg via ORAL
  Filled 2018-01-14 (×3): qty 1

## 2018-01-14 MED ORDER — FUROSEMIDE 10 MG/ML IJ SOLN
40.0000 mg | Freq: Two times a day (BID) | INTRAMUSCULAR | Status: DC
  Administered 2018-01-15 (×2): 40 mg via INTRAVENOUS
  Filled 2018-01-14 (×2): qty 4

## 2018-01-14 MED ORDER — SACUBITRIL-VALSARTAN 49-51 MG PO TABS
1.0000 | ORAL_TABLET | Freq: Two times a day (BID) | ORAL | Status: DC
  Administered 2018-01-14 – 2018-01-17 (×6): 1 via ORAL
  Filled 2018-01-14 (×6): qty 1

## 2018-01-14 MED ORDER — ACETAMINOPHEN 325 MG PO TABS
650.0000 mg | ORAL_TABLET | Freq: Three times a day (TID) | ORAL | Status: DC | PRN
  Administered 2018-01-14 – 2018-01-17 (×7): 650 mg via ORAL
  Filled 2018-01-14 (×7): qty 2

## 2018-01-14 MED ORDER — NITROGLYCERIN 0.4 MG SL SUBL: 0.4000 mg | SUBLINGUAL_TABLET | SUBLINGUAL | Status: DC | PRN

## 2018-01-14 MED ORDER — NICOTINE 7 MG/24HR TD PT24: 7.0000 mg | MEDICATED_PATCH | Freq: Every day | TRANSDERMAL | Status: DC | PRN

## 2018-01-14 MED ORDER — POLYETHYLENE GLYCOL 3350 17 G PO PACK
17.0000 g | PACK | Freq: Every day | ORAL | Status: DC
  Administered 2018-01-14 – 2018-01-16 (×3): 17 g via ORAL
  Filled 2018-01-14 (×3): qty 1

## 2018-01-14 MED ORDER — ONDANSETRON HCL 4 MG PO TABS: 4.0000 mg | ORAL_TABLET | Freq: Four times a day (QID) | ORAL | Status: DC | PRN

## 2018-01-14 MED ORDER — MOMETASONE FURO-FORMOTEROL FUM 200-5 MCG/ACT IN AERO
2.0000 | INHALATION_SPRAY | Freq: Two times a day (BID) | RESPIRATORY_TRACT | Status: DC
  Administered 2018-01-15 – 2018-01-17 (×4): 2 via RESPIRATORY_TRACT
  Filled 2018-01-14: qty 8.8

## 2018-01-14 MED ORDER — ONDANSETRON HCL 4 MG/2ML IJ SOLN: 4.0000 mg | Freq: Four times a day (QID) | INTRAMUSCULAR | Status: DC | PRN

## 2018-01-14 MED ORDER — FUROSEMIDE 10 MG/ML IJ SOLN
80.0000 mg | Freq: Once | INTRAMUSCULAR | Status: AC
  Administered 2018-01-14: 80 mg via INTRAVENOUS

## 2018-01-14 NOTE — ED Triage Notes (Signed)
Patient reports that Wed she was seen at PCP for acute bronchitis and given shot of steroid and taking pills. But SOB gotten worse yesterday. Patient has cough

## 2018-01-14 NOTE — ED Notes (Addendum)
Patient ambulatory to restroom without difficulty. 

## 2018-01-14 NOTE — H&P (Signed)
History and Physical   [name]:[id number] DOB: 1951/10/08 DOA: 01/14/2018  Referring MD/NP/PA: Wilson Singer, MD, EDP PCP: [place], [place] At Outpatient Specialists: Cardiology, Bensimohn  Patient coming from: Home  Chief Complaint: Shortness of breath  HPI: [name] is a 67 y.o. female with a history of COPD, ongoing tobacco use, and systolic heart failure who presented to the ED for worsening dyspnea and orthopnea. She began with URI symptoms about a week ago that worsened, prompting her to see her PCP earlier in the week and again 2 days ago when she was prescribed an antibiotic and prednisone. She reluctantly took these medications, though she says prednisone is hard on her stomach. She has noticed increasing weights from 128lbs > 131lbs over the past few days, increasing abdominal distention and orthopnea since starting steroids. She also notes some wheezing that wasn't improved with albuterol inhaler but has responded to nebulized therapy here. A dry cough has developed as well with pleuritic chest pain.  ED Course: Afebrile, tachypneic with sinus tachycardia and increased work of breathing and hypoxia on EDP exam. Declined CXR, though imaging 2 days ago did not show infiltrate. WBC 10.6, Hgb 11.9. Creatinine 1.19 and BNP >2k. Hospitalists consulted for admission for heart failure. Viral panel pending.   Review of Systems: Denies fevers, abd pain, N/V/D, palpitations. , and per HPI. All others reviewed and are negative.   Past Medical History:  Diagnosis Date  . Arthritis    hands  . Asthma   . CAD (coronary artery disease)    a. s/p prior Ant MI, b. s/p prior POBA to LAD, Dx, RCA,;  c.  LHC (10/12):  dLAD 40, pCFX 30, OM1 50, pRCA 30;  d.  [facility] Myoview (11/14):  High risk study; inferior, anterior, apical defects; minimal reversibility toward the apex; consistent with prior infarct and very mild peri-infarct ischemia, EF 24%, inferior/apical/anterior  akinesis  . Chronic systolic heart failure ([place])   . Depression   . H/O hiatal hernia   . HLD (hyperlipidemia)   . HTN (hypertension)   . Ischemic cardiomyopathy    MRI (12/09):  High scar burden, Inf and Apical AK, EF 24%.;  Echo (10/14): EF 15%, Gr 2 DD, mild to mod MR, mod LAE, RVSF mildly reduced, mod RAE, severe TR, PASP 49-53 (mod pulmonary HTN)  . MI (myocardial infarction) ([place])   . Tobacco abuse    Past Surgical History:  Procedure Laterality Date  . CARDIAC DEFIBRILLATOR PLACEMENT  01/18/2009   MDT single chamber ICD implanted by Dr Rayann Heman   . cardiac stents    . COLONOSCOPY  03/18/2012   Procedure: COLONOSCOPY;  Surgeon: Beryle Beams, MD;  Location: [facility] ENDOSCOPY;  Service: Endoscopy;  Laterality: N/A;  . CORONARY ANGIOPLASTY    . ESOPHAGOGASTRODUODENOSCOPY N/A 03/10/2013   Procedure: ESOPHAGOGASTRODUODENOSCOPY (EGD);  Surgeon: Beryle Beams, MD;  Location: [facility];  Service: Endoscopy;  Laterality: N/A;  . LEFT HEART CATHETERIZATION WITH CORONARY ANGIOGRAM N/A 07/26/2014   Procedure: LEFT HEART CATHETERIZATION WITH CORONARY ANGIOGRAM;  Surgeon: Jolaine Artist, MD;  Location: [facility] C[facility] LAB;  Service: Cardiovascular;  Laterality: N/A;   - Smokes 6 cigarettes per day, no EtOH or drugs. Independent.  reports that she has been smoking cigarettes.  she has never used smokeless tobacco. She reports that she does not drink alcohol or use drugs. Allergies  Allergen Reactions  . Prednisone Shortness Of Breath and Rash  . Zebeta [Bisoprolol Fumarate]     Pt  states she couldn't think and it messed her head up  . Statins Other (See Comments)    Gradually tired with daily Lipitor, made pt feel bad (Pt can take Crestor 5 mg 4 times per week and Zetia 5 mg every other day)   History reviewed. No pertinent family history. - Family history otherwise reviewed and not pertinent.  Prior to Admission medications   Medication Sig Start Date End Date Taking? Authorizing Provider    acetaminophen (TYLENOL ARTHRITIS PAIN) 650 MG CR tablet Take 650 mg by mouth every 8 (eight) hours as needed for pain.   Yes [provider]  albuterol (PROAIR HFA) 108 (90 Base) MCG/ACT inhaler Inhale 2 puffs into the lungs every 6 (six) hours as needed for wheezing. 01/12/18  Yes [provider]  aspirin 81 MG tablet Take 81 mg by mouth daily.     Yes [provider]  azelastine (ASTELIN) 0.1 % nasal spray Place 1 spray into both nostrils daily. Use in each nostril as directed    Yes [provider]  budesonide-formoterol (SYMBICORT) 160-4.5 MCG/ACT inhaler Inhale 2 puffs into the lungs 2 (two) times daily. 10/14/17  Yes [provider]  citalopram (CELEXA) 20 MG tablet Take 20 mg by mouth daily. 12/16/17  Yes [provider]  doxycycline (VIBRA-TABS) 100 MG tablet Take 100 mg by mouth 2 (two) times daily.   Yes [provider]  ezetimibe (ZETIA) 10 MG tablet TAKE 1/2 TABLET TWICE A WEEK 07/02/17  Yes Bensimhon, Shaune Pascal, MD  fexofenadine (ALLEGRA) 180 MG tablet Take 180 mg by mouth daily as needed for allergies.    Yes [provider]  fish oil-omega-3 fatty acids 1000 MG capsule Take 2 g by mouth daily.    Yes [provider]  fluconazole (DIFLUCAN) 40 MG/ML suspension Take 200 mg by mouth. SWISH AND SWALLOW 4 TIMES DAILY 01/12/18 01/26/18 Yes [provider]  fluticasone furoate-vilanterol (BREO ELLIPTA) 100-25 MCG/INH AEPB Inhale 1 puff into the lungs daily as needed for shortness of breath or wheezing. 04/08/16  Yes [provider]  furosemide (LASIX) 20 MG tablet TAKE 2 TABLETS BY MOUTH DAILY. MAY TAKE ADDITIONAL 2 TABS AS NEEDED FOR SWELLING/ SHORT OF BREATH 09/23/17  Yes Bensimhon, Shaune Pascal, MD  isosorbide mononitrate (IMDUR) 30 MG 24 hr tablet Take 30 mg by mouth daily.   Yes [provider]  pantoprazole (PROTONIX) 40 MG tablet Take 40 mg by mouth daily. 11/03/16  Yes [provider]   polyethylene glycol (MIRALAX / GLYCOLAX) packet Take 17 g by mouth at bedtime.   Yes [provider]  rosuvastatin (CRESTOR) 5 MG tablet TAKE 1 TABLET BY MOUTH EVERY DAY ON Adelfa Koh, [facility] FRIDAY 06/04/17  Yes Josue Hector, MD  sacubitril-valsartan (ENTRESTO) 49-51 MG Take 1 tablet by mouth 2 (two) times daily. 10/05/17  Yes Bensimhon, Shaune Pascal, MD  spironolactone (ALDACTONE) 25 MG tablet TAKE 1 TABLET BY MOUTH EVERY DAY 11/23/17  Yes Larey Dresser, MD  nitroGLYCERIN (NITROSTAT) 0.4 MG SL tablet Place 1 tablet (0.4 mg total) under the tongue every 5 (five) minutes as needed for chest pain. 10/28/16 10/23/17  Josue Hector, MD  predniSONE (DELTASONE) 10 MG tablet Take 3 tablets PO for 3 days, then take 2 tablets PO for 3 days, then take 1 tablet PO for 3 days. 01/12/18   [provider]  spironolactone (ALDACTONE) 25 MG tablet Take 1 tablet (25 mg total) by mouth at bedtime. Patient not taking:  Reported on 01/14/2018 03/26/17   Shirley Friar, PA-C  Budesonide (PULMICORT IN) Inhale into the lungs as directed.    02/29/12  [provider]  omeprazole (PRILOSEC) 10 MG capsule Take 10 mg by mouth daily.    02/29/12  [provider]    Physical Exam: Vitals:   01/14/18 0833 01/14/18 1241 01/14/18 1304 01/14/18 1614  BP: 131/75  109/62 (!) 104/59  Pulse: (!) 119  (!) 108 (!) 103  Resp: (!) 29 18 20 18   Temp: 98.4 F (36.9 C)     TempSrc: Oral     SpO2: 92% 93% 100% 96%   Constitutional: 67 y.o. female in no distress, calm demeanor Eyes: Lids and conjunctivae normal, PERRL ENMT: Mucous membranes are moist. Posterior pharynx clear of any exudate or lesions. Fair dentition.  Neck: normal, supple, no masses, no thyromegaly Respiratory: Labored tachypnea with diminished breath sounds with scattered rhonchi. Cardiovascular: Regular tachycardia, no murmurs, rubs, or gallops. No carotid bruits. No JVD. No LE edema. 2+ pedal pulses. Abdomen:  Normoactive bowel sounds. No tenderness, non-distended, and no masses palpated. No hepatosplenomegaly. No pitting edema. GU: No indwelling catheter Musculoskeletal: No clubbing / cyanosis. No joint deformity upper and lower extremities. Good ROM, no contractures. Normal muscle tone.  Skin: Warm, dry. No rashes, wounds, or ulcers. No significant lesions noted.  Neurologic: CN II-XII grossly intact. Gait narrow based. Speech normal. No focal deficits in motor strength or sensation in all extremities.  Psychiatric: Alert and oriented x3. Normal judgment and insight. Mood anxious with congruent affect.   CBC: Recent Labs  Lab 01/14/18 1254  WBC 10.6*  NEUTROABS 9.2*  HGB 11.9*  HCT 34.2*  MCV 94.2  PLT 390   Basic Metabolic Panel: Recent Labs  Lab 01/14/18 1254  NA 134*  K 4.1  CL 100*  CO2 23  GLUCOSE 117*  BUN 17  CREATININE 1.19*  CALCIUM 9.4   EKG: Independently reviewed. Sinus tachycardia w/LAD, IVCD, no ST depression/elevation.   Assessment/Plan Principal Problem:   Acute on chronic combined systolic and diastolic CHF (congestive heart failure) (HCC) Active Problems:   Essential hypertension   Coronary atherosclerosis   Tobacco abuse   AICD (automatic cardioverter/defibrillator) present   Hyperlipidemia   COPD (chronic obstructive pulmonary disease) (HCC)   Acute hypoxic respiratory failure: Pt was in upper 80%'s, currently refusing supplemental oxygen. Improved but still with increased WOB after continuous neb. Suspect due to URI, possible viral bronchitis triggering COPD exacerbation and CHF exacerbation from steroids.  - Diuresis, breathing treatments as below. Hoping to avoid further steroids.   Acute on chronic combined CHF, hx CAD s/p MI high scar burden, s/p AICD: Echo Oct 2017 showed EF 25-30%, G1DD, diffuse hypokinesis, followed by Dr. Jeffie Pollock. Possible precipitant is recent steroids.  - Lasix 80mg  IV given now, start 40mg  IV BID tomorrow, adjust based on  diuretic effect. - Continue home medications entresto, spironolactone, imdur, ASA - Daily weights (awaiting weight in ED), strict I/O - Sodium restriction. - Update echocardiogram - If any difficulty diuresing, consider HF team consultation - Troponin pending  COPD exacerbation:  - Duonebs scheduled, albuterol prn - Avoiding steroids if possible, received solumedrol 125mg  in ED - RVP pending, empiric droplet precautions  Tobacco use:  - Cessation counseling provided  Hyperlipidemia:  - Takes statin irregularly, will not administer for now  Depression: Chronic, stable.  - continue home medication  DVT prophylaxis: Lovenox  Code Status: Full  Family Communication: None at bedside Disposition Plan: Uncertain,  likely home when improved Consults called: None  Admission status: Inpatient    Vance Gather, MD [facility] Pager [phone]  If 7PM-7AM, please contact night-coverage [email or web address] Password [facility] 01/14/2018, 5:31 PM

## 2018-01-14 NOTE — Progress Notes (Signed)
CRITICAL VALUE ALERT  Critical Value:  Troponin 0.25  Date & Time Notied:  01-14-18 2230  Provider Notified: X. Blount  Orders Received/Actions taken:

## 2018-01-14 NOTE — ED Notes (Signed)
Patient given coffee. 

## 2018-01-14 NOTE — ED Notes (Signed)
Patient reports that she had chest xray WED and doesn't want another one now.

## 2018-01-14 NOTE — ED Notes (Signed)
Hospitalist at bedside 

## 2018-01-14 NOTE — ED Provider Notes (Signed)
[facility] DEPT Provider Note   CSN: [id number] Arrival date & time: 01/14/18  0815     History   Chief Complaint Chief Complaint  Patient presents with  . Shortness of Breath    HPI [name] is a 67 y.o. female.  HPI   67 year old female with dyspnea.  Her progressively worsening over the past 4-5 days.  She was seen by her PCP on Wednesday and prescribed Levaquin and prednisone.  She reports that she did not take it better.  She reports that she has an allergy to prednisone and her pharmacist advised her not to take the Levaquin because of "my heart."  Symptoms have progressively worsened.  Productive cough.  She feels like she just cannot clear her lungs.  No fevers or chills.  Some nausea.  She does endorse some chest discomfort which she attributes to all her coughing.   Past Medical History:  Diagnosis Date  . Arthritis    hands  . Asthma   . CAD (coronary artery disease)    a. s/p prior Ant MI, b. s/p prior POBA to LAD, Dx, RCA,;  c.  LHC (10/12):  dLAD 40, pCFX 30, OM1 50, pRCA 30;  d.  [facility] Adam Myoview (11/14):  High risk study; inferior, anterior, apical defects; minimal reversibility toward the apex; consistent with prior infarct and very mild peri-infarct ischemia, EF 24%, inferior/apical/anterior akinesis  . Chronic systolic heart failure ([place])   . Depression   . H/O hiatal hernia   . HLD (hyperlipidemia)   . HTN (hypertension)   . Ischemic cardiomyopathy    MRI (12/09):  High scar burden, Inf and Apical AK, EF 24%.;  Echo (10/14): EF 15%, Gr 2 DD, mild to mod MR, mod LAE, RVSF mildly reduced, mod RAE, severe TR, PASP 49-53 (mod pulmonary HTN)  . MI (myocardial infarction) ([place])   . Tobacco abuse     Patient Active Problem List   Diagnosis Date Noted  . Dizziness 07/30/2017  . Claudication, intermittent ([place]) 08/07/2014  . Bilateral carotid artery stenosis 08/07/2014  . Precordial chest pain 07/25/2014  . Chronic systolic  heart failure ([place]) 10/10/2013  . COPD (chronic obstructive pulmonary disease) ([place]) 10/10/2013  . Tobacco use 10/10/2013  . Hyperlipidemia 06/22/2013  . Leg pain 06/05/2013  . AICD (automatic cardioverter/defibrillator) present 09/17/2011  . CHF (congestive heart failure) ([place]) 08/14/2011  . SMOKER 01/30/2011  . Other symptoms involving cardiovascular system 01/30/2011  . DEPRESSION 05/01/2009  . Essential hypertension 05/01/2009  . MYOCARDIAL INFARCTION 05/01/2009  . Coronary atherosclerosis 05/01/2009  . CARDIOMYOPATHY, ISCHEMIC 05/01/2009    Past Surgical History:  Procedure Laterality Date  . CARDIAC DEFIBRILLATOR PLACEMENT  01/18/2009   MDT single chamber ICD implanted by Dr Rayann Heman   . cardiac stents    . COLONOSCOPY  03/18/2012   Procedure: COLONOSCOPY;  Surgeon: Beryle Beams, MD;  Location: [facility] ENDOSCOPY;  Service: Endoscopy;  Laterality: N/A;  . CORONARY ANGIOPLASTY    . ESOPHAGOGASTRODUODENOSCOPY N/A 03/10/2013   Procedure: ESOPHAGOGASTRODUODENOSCOPY (EGD);  Surgeon: Beryle Beams, MD;  Location: [facility];  Service: Endoscopy;  Laterality: N/A;  . LEFT HEART CATHETERIZATION WITH CORONARY ANGIOGRAM N/A 07/26/2014   Procedure: LEFT HEART CATHETERIZATION WITH CORONARY ANGIOGRAM;  Surgeon: Jolaine Artist, MD;  Location: [facility] CATH LAB;  Service: Cardiovascular;  Laterality: N/A;    OB History    No data available       Home Medications    Prior to Admission medications  Medication Sig Start Date End Date Taking? Authorizing Provider  acetaminophen (TYLENOL ARTHRITIS PAIN) 650 MG CR tablet Take 650 mg by mouth every 8 (eight) hours as needed for pain.   Yes [provider]  albuterol (PROAIR HFA) 108 (90 Base) MCG/ACT inhaler Inhale 2 puffs into the lungs every 6 (six) hours as needed for wheezing. 01/12/18  Yes [provider]  aspirin 81 MG tablet Take 81 mg by mouth daily.     Yes [provider]  azelastine (ASTELIN) 0.1 % nasal spray  Place 1 spray into both nostrils daily. Use in each nostril as directed    Yes [provider]  budesonide-formoterol (SYMBICORT) 160-4.5 MCG/ACT inhaler Inhale 2 puffs into the lungs 2 (two) times daily. 10/14/17  Yes [provider]  citalopram (CELEXA) 20 MG tablet Take 20 mg by mouth daily. 12/16/17  Yes [provider]  doxycycline (VIBRA-TABS) 100 MG tablet Take 100 mg by mouth 2 (two) times daily.   Yes [provider]  ezetimibe (ZETIA) 10 MG tablet TAKE 1/2 TABLET TWICE A WEEK 07/02/17  Yes Bensimhon, Shaune Pascal, MD  fexofenadine (ALLEGRA) 180 MG tablet Take 180 mg by mouth daily as needed for allergies.    Yes [provider]  fish oil-omega-3 fatty acids 1000 MG capsule Take 2 g by mouth daily.    Yes [provider]  fluconazole (DIFLUCAN) 40 MG/ML suspension Take 200 mg by mouth. SWISH AND SWALLOW 4 TIMES DAILY 01/12/18 01/26/18 Yes [provider]  fluticasone furoate-vilanterol (BREO ELLIPTA) 100-25 MCG/INH AEPB Inhale 1 puff into the lungs daily as needed for shortness of breath or wheezing. 04/08/16  Yes [provider]  furosemide (LASIX) 20 MG tablet TAKE 2 TABLETS BY MOUTH DAILY. MAY TAKE ADDITIONAL 2 TABS AS NEEDED FOR SWELLING/ SHORT OF BREATH 09/23/17  Yes Bensimhon, Shaune Pascal, MD  isosorbide mononitrate (IMDUR) 30 MG 24 hr tablet Take 30 mg by mouth daily.   Yes [provider]  pantoprazole (PROTONIX) 40 MG tablet Take 40 mg by mouth daily. 11/03/16  Yes [provider]  polyethylene glycol (MIRALAX / GLYCOLAX) packet Take 17 g by mouth at bedtime.   Yes [provider]  rosuvastatin (CRESTOR) 5 MG tablet TAKE 1 TABLET BY MOUTH EVERY DAY ON Adelfa Koh, [facility] FRIDAY 06/04/17  Yes Josue Hector, MD  sacubitril-valsartan (ENTRESTO) 49-51 MG Take 1 tablet by mouth 2 (two) times daily. 10/05/17  Yes Bensimhon, Shaune Pascal, MD  spironolactone (ALDACTONE) 25 MG tablet TAKE 1 TABLET BY MOUTH  EVERY DAY 11/23/17  Yes Larey Dresser, MD  nitroGLYCERIN (NITROSTAT) 0.4 MG SL tablet Place 1 tablet (0.4 mg total) under the tongue every 5 (five) minutes as needed for chest pain. 10/28/16 10/23/17  Josue Hector, MD  predniSONE (DELTASONE) 10 MG tablet Take 3 tablets PO for 3 days, then take 2 tablets PO for 3 days, then take 1 tablet PO for 3 days. 01/12/18   [provider]  spironolactone (ALDACTONE) 25 MG tablet Take 1 tablet (25 mg total) by mouth at bedtime. Patient not taking: Reported on 01/14/2018 03/26/17   Shirley Friar, PA-C  Budesonide (PULMICORT IN) Inhale into the lungs as directed.    02/29/12  [provider]  omeprazole (PRILOSEC) 10 MG capsule Take 10 mg by mouth daily.    02/29/12  [provider]    Family History No family history on file.  Social History Social History   Tobacco Use  .  Smoking status: Current Every Day Smoker    Types: Cigarettes  . Smokeless tobacco: Never Used  Substance Use Topics  . Alcohol use: No  . Drug use: No     Allergies   Prednisone; Zebeta [bisoprolol fumarate]; and Statins   Review of Systems Review of Systems  All systems reviewed and negative, other than as noted in HPI. Physical Exam Updated Vital Signs BP 109/62   Pulse (!) 108   Temp 98.4 F (36.9 C) (Oral)   Resp 20   SpO2 100%   Physical Exam  Constitutional: She appears well-developed and well-nourished. No distress.  HENT:  Head: Normocephalic and atraumatic.  Eyes: Conjunctivae are normal. Right eye exhibits no discharge. Left eye exhibits no discharge.  Neck: Neck supple.  Cardiovascular: Normal rate, regular rhythm and normal heart sounds. Exam reveals no gallop and no friction rub.  No murmur heard. Pulmonary/Chest: Effort normal and breath sounds normal. No respiratory distress.  Kidney appeared coarse breath sounds bilaterally.  Abdominal: Soft. She exhibits no distension. There is no tenderness.    Musculoskeletal: She exhibits no edema or tenderness.  Neurological: She is alert.  Skin: Skin is warm and dry.  Psychiatric: She has a normal mood and affect. Her behavior is normal. Thought content normal.  Nursing note and vitals reviewed.    ED Treatments / Results  Labs (all labs ordered are listed, but only abnormal results are displayed) Labs Reviewed  CBC WITH DIFFERENTIAL/PLATELET - Abnormal; Notable for the following components:      Result Value   WBC 10.6 (*)    RBC 3.63 (*)    Hemoglobin 11.9 (*)    HCT 34.2 (*)    Neutro Abs 9.2 (*)    Lymphs Abs 0.5 (*)    All other components within normal limits  BASIC METABOLIC PANEL - Abnormal; Notable for the following components:   Sodium 134 (*)    Chloride 100 (*)    Glucose, Bld 117 (*)    Creatinine, Ser 1.19 (*)    GFR calc non Af Amer 46 (*)    GFR calc Af Amer 53 (*)    All other components within normal limits  BRAIN NATRIURETIC PEPTIDE - Abnormal; Notable for the following components:   B Natriuretic Peptide 2,051.6 (*)    All other components within normal limits  RESPIRATORY PANEL BY PCR  TROPONIN I    EKG  EKG Interpretation  Date/Time:  Friday January 14 2018 08:32:05 EST Ventricular Rate:  118 PR Interval:    QRS Duration: 119 QT Interval:  349 QTC Calculation: 489 R Axis:   -67 Text Interpretation:  Sinus tachycardia LAD, consider left anterior fascicular block Probable anterior infarct, old Nonspecific T abnormalities, lateral leads Confirmed by Virgel Manifold [phone]) on 01/14/2018 8:43:45 AM Also confirmed by Virgel Manifold [phone]), editor Lynder Parents [phone])  on 01/14/2018 9:42:47 AM       Radiology No results found.  Procedures Procedures (including critical care time)  Medications Ordered in ED Medications  albuterol (PROVENTIL,VENTOLIN) solution continuous neb (10 mg/hr Nebulization New Bag/Given 01/14/18 1241)  albuterol (PROVENTIL) (2.5 MG/3ML) 0.083% nebulizer solution 5 mg (5  mg Nebulization Given 01/14/18 0846)  methylPREDNISolone sodium succinate (SOLU-MEDROL) 125 mg/2 mL injection 125 mg (125 mg Intravenous Given 01/14/18 1302)     Initial Impression / Assessment and Plan / ED Course  I have reviewed the triage vital signs and the nursing notes.  Pertinent labs & imaging results that were available during my  care of the patient were reviewed by me and considered in my medical decision making (see chart for details).     67 year old female with progressive cough and dyspnea.  Clinically suspect heart failure. She she is declining chest x-ray.  She had one 2 days ago which did not show any significant dated.  Coarse breath sounds on exam.  Perhaps some mild wheezing.  Oxygen saturations in the high 80s on room air.  She does not use oxygen at home.  Will admit for ongoing treatment.  Final Clinical Impressions(s) / ED Diagnoses   Final diagnoses:  Dyspnea, unspecified type  Heart failure, unspecified HF chronicity, unspecified heart failure type [facility])    ED Discharge Orders    None       Virgel Manifold, MD 01/14/18 1616

## 2018-01-14 NOTE — ED Notes (Signed)
Family at bedside. 

## 2018-01-14 NOTE — ED Notes (Signed)
Pt was tachypneic with some increased sob.  Placed pt on 2L [place] and pt began feeling better.

## 2018-01-14 NOTE — Progress Notes (Signed)
Patient positive for flu per respiratory panel. Paged on call provider.

## 2018-01-14 NOTE — ED Notes (Signed)
ED TO INPATIENT HANDOFF REPORT  Name/Age/Gender [name] 67 y.o. female  Code Status    Code Status Orders  (From admission, onward)        Start     Ordered   01/14/18 1924  Full code  Continuous     01/14/18 1923    Code Status History    Date Active Date Inactive Code Status Order ID Comments User Context   07/26/2014 12:48 07/26/2014 19:54 Full Code [id number]  Bensimhon, Shaune Pascal, MD Inpatient      Home/SNF/Other Home  Chief Complaint shortness of breath  Level of Care/Admitting Diagnosis ED Disposition    ED Disposition Condition [facility] Area: [facility] [[id number]]  Level of Care: Telemetry [5]  Admit to tele based on following criteria: Acute CHF  Diagnosis: Acute systolic (congestive) heart failure [facility]) [[id number]]  Admitting Physician: Patrecia Pour [phone]  Attending Physician: Patrecia Pour [phone]  Estimated length of stay: past midnight tomorrow  Certification:: I certify this patient will need inpatient services for at least 2 midnights  PT Class (Do Not Modify): Inpatient [101]  PT Acc Code (Do Not Modify): Private [1]       Medical History Past Medical History:  Diagnosis Date  . Arthritis    hands  . Asthma   . CAD (coronary artery disease)    a. s/p prior Ant MI, b. s/p prior POBA to LAD, Dx, RCA,;  c.  LHC (10/12):  dLAD 40, pCFX 30, OM1 50, pRCA 30;  d.  [facility] Adam Myoview (11/14):  High risk study; inferior, anterior, apical defects; minimal reversibility toward the apex; consistent with prior infarct and very mild peri-infarct ischemia, EF 24%, inferior/apical/anterior akinesis  . Chronic systolic heart failure ([place])   . Depression   . H/O hiatal hernia   . HLD (hyperlipidemia)   . HTN (hypertension)   . Ischemic cardiomyopathy    MRI (12/09):  High scar burden, Inf and Apical AK, EF 24%.;  Echo (10/14): EF 15%, Gr 2 DD, mild to mod MR, mod LAE, RVSF mildly reduced, mod RAE, severe TR, PASP 49-53 (mod  pulmonary HTN)  . MI (myocardial infarction) ([place])   . Tobacco abuse     Allergies Allergies  Allergen Reactions  . Prednisone Shortness Of Breath and Rash  . Zebeta [Bisoprolol Fumarate]     Pt states she couldn't think and it messed her head up  . Statins Other (See Comments)    Gradually tired with daily Lipitor, made pt feel bad (Pt can take Crestor 5 mg 4 times per week and Zetia 5 mg every other day)    IV Location/Drains/Wounds Patient Lines/Drains/Airways Status   Active Line/Drains/Airways    Name:   Placement date:   Placement time:   Site:   Days:   Peripheral IV 01/14/18 Right Antecubital   01/14/18    1259    Antecubital   less than 1          Labs/Imaging Results for orders placed or performed during the hospital encounter of 01/14/18 (from the past 48 hour(s))  Brain natriuretic peptide     Status: Abnormal   Collection Time: 01/14/18 12:36 PM  Result Value Ref Range   B Natriuretic Peptide 2,051.6 (H) 0.0 - 100.0 pg/mL    Comment: Performed at [facility], [place] [address] [place], [place] [address]  CBC with Differential     Status: Abnormal   Collection Time:  01/14/18 12:54 PM  Result Value Ref Range   WBC 10.6 (H) 4.0 - 10.5 K/uL   RBC 3.63 (L) 3.87 - 5.11 MIL/uL   Hemoglobin 11.9 (L) 12.0 - 15.0 g/dL   HCT 34.2 (L) 36.0 - 46.0 %   MCV 94.2 78.0 - 100.0 fL   MCH 32.8 26.0 - 34.0 pg   MCHC 34.8 30.0 - 36.0 g/dL   RDW 13.4 11.5 - 15.5 %   Platelets 169 150 - 400 K/uL   Neutrophils Relative % 87 %   Neutro Abs 9.2 (H) 1.7 - 7.7 K/uL   Lymphocytes Relative 5 %   Lymphs Abs 0.5 (L) 0.7 - 4.0 K/uL   Monocytes Relative 8 %   Monocytes Absolute 0.9 0.1 - 1.0 K/uL   Eosinophils Relative 0 %   Eosinophils Absolute 0.0 0.0 - 0.7 K/uL   Basophils Relative 0 %   Basophils Absolute 0.0 0.0 - 0.1 K/uL    Comment: Performed at [facility], [place] [address], [place], [place] [address]  Basic metabolic panel     Status:  Abnormal   Collection Time: 01/14/18 12:54 PM  Result Value Ref Range   Sodium 134 (L) 135 - 145 mmol/L   Potassium 4.1 3.5 - 5.1 mmol/L   Chloride 100 (L) 101 - 111 mmol/L   CO2 23 22 - 32 mmol/L   Glucose, Bld 117 (H) 65 - 99 mg/dL   BUN 17 6 - 20 mg/dL   Creatinine, Ser 1.19 (H) 0.44 - 1.00 mg/dL   Calcium 9.4 8.9 - 10.3 mg/dL   GFR calc non Af Amer 46 (L) >60 mL/min   GFR calc Af Amer 53 (L) >60 mL/min    Comment: (NOTE) The eGFR has been calculated using the CKD EPI equation. This calculation has not been validated in all clinical situations. eGFR's persistently <60 mL/min signify possible Chronic Kidney Disease.    Anion gap 11 5 - 15    Comment: Performed at [facility], [place] [address], [place], [place] [address]   No results found.  Pending Labs Unresulted Labs (From admission, onward)   Start     Ordered   01/21/18 0500  Creatinine, serum  (enoxaparin (LOVENOX)    CrCl >/= 30 ml/min)  Weekly,   R    Comments:  while on enoxaparin therapy    01/14/18 1923   01/15/18 7824  Basic metabolic panel  Tomorrow morning,   R     01/14/18 1923   01/14/18 1555  Troponin I  Add-on,   STAT     01/14/18 1554   01/14/18 1353  Respiratory Panel by PCR  (Respiratory virus panel)  Once,   R     01/14/18 1352      Vitals/Pain Today's Vitals   01/14/18 1922 01/14/18 2000 01/14/18 2126 01/14/18 2126  BP: (!) 102/56 (!) 112/58  131/76  Pulse: (!) 102 98  (!) 108  Resp: (!) 22 (!) 22  (!) 26  Temp: 98.7 F (37.1 C)   98.1 F (36.7 C)  TempSrc: Oral   Oral  SpO2: 97% 96%  100%  Weight:      PainSc: '8   4  4     '$ Isolation Precautions Droplet precaution  Medications Medications  acetaminophen (TYLENOL) tablet 650 mg (650 mg Oral Given 01/14/18 2036)  ipratropium-albuterol (DUONEB) 0.5-2.5 (3) MG/3ML nebulizer solution 3 mL (not administered)  albuterol (PROVENTIL) (2.5 MG/3ML) 0.083% nebulizer solution 2.5 mg (not administered)  azelastine (ASTELIN) 0.1  % nasal spray 1 spray (not administered)  aspirin tablet 81 mg (81 mg Oral Not Given 01/14/18 1930)  mometasone-formoterol (DULERA) 200-5 MCG/ACT inhaler 2 puff (not administered)  citalopram (CELEXA) tablet 20 mg (20 mg Oral Not Given 01/14/18 1930)  loratadine (CLARITIN) tablet 10 mg (not administered)  isosorbide mononitrate (IMDUR) 24 hr tablet 30 mg (not administered)  nitroGLYCERIN (NITROSTAT) SL tablet 0.4 mg (not administered)  pantoprazole (PROTONIX) EC tablet 40 mg (40 mg Oral Not Given 01/14/18 1930)  polyethylene glycol (MIRALAX / GLYCOLAX) packet 17 g (not administered)  sacubitril-valsartan (ENTRESTO) 49-51 mg per tablet (not administered)  spironolactone (ALDACTONE) tablet 25 mg (not administered)  enoxaparin (LOVENOX) injection 40 mg (not administered)  sodium chloride flush (NS) 0.9 % injection 3 mL (not administered)  ondansetron (ZOFRAN) tablet 4 mg (not administered)    Or  ondansetron (ZOFRAN) injection 4 mg (not administered)  furosemide (LASIX) injection 40 mg (not administered)  nicotine (NICODERM CQ - dosed in mg/24 hr) patch 7 mg (not administered)  albuterol (PROVENTIL) (2.5 MG/3ML) 0.083% nebulizer solution 5 mg (5 mg Nebulization Given 01/14/18 0846)  methylPREDNISolone sodium succinate (SOLU-MEDROL) 125 mg/2 mL injection 125 mg (125 mg Intravenous Given 01/14/18 1302)  furosemide (LASIX) injection 80 mg (80 mg Intravenous Given 01/14/18 1636)    Mobility Walks (I have been accompanying her here in ED to restroom)

## 2018-01-15 ENCOUNTER — Other Ambulatory Visit: Payer: Self-pay

## 2018-01-15 ENCOUNTER — Inpatient Hospital Stay (HOSPITAL_COMMUNITY): Payer: Medicare Other

## 2018-01-15 DIAGNOSIS — I340 Nonrheumatic mitral (valve) insufficiency: Secondary | ICD-10-CM

## 2018-01-15 DIAGNOSIS — R748 Abnormal levels of other serum enzymes: Secondary | ICD-10-CM

## 2018-01-15 DIAGNOSIS — J101 Influenza due to other identified influenza virus with other respiratory manifestations: Secondary | ICD-10-CM

## 2018-01-15 DIAGNOSIS — R0902 Hypoxemia: Secondary | ICD-10-CM

## 2018-01-15 DIAGNOSIS — J441 Chronic obstructive pulmonary disease with (acute) exacerbation: Secondary | ICD-10-CM

## 2018-01-15 LAB — BASIC METABOLIC PANEL
Anion gap: 8 (ref 5–15)
BUN: 23 mg/dL — ABNORMAL HIGH (ref 6–20)
CALCIUM: 9.1 mg/dL (ref 8.9–10.3)
CO2: 26 mmol/L (ref 22–32)
Chloride: 100 mmol/L — ABNORMAL LOW (ref 101–111)
Creatinine, Ser: 1.27 mg/dL — ABNORMAL HIGH (ref 0.44–1.00)
GFR, EST AFRICAN AMERICAN: 49 mL/min — AB (ref >60)
GFR, EST NON AFRICAN AMERICAN: 42 mL/min — AB (ref >60)
GLUCOSE: 161 mg/dL — AB (ref 65–99)
POTASSIUM: 4.1 mmol/L (ref 3.5–5.1)
Sodium: 134 mmol/L — ABNORMAL LOW (ref 135–145)

## 2018-01-15 LAB — ECHOCARDIOGRAM COMPLETE
HEIGHTINCHES: 63 in
Weight: 2067.03 oz

## 2018-01-15 LAB — TROPONIN I
TROPONIN I: 0.25 ng/mL — AB (ref <0.03)
TROPONIN I: 0.34 ng/mL — AB (ref <0.03)
Troponin I: 0.27 ng/mL (ref <0.03)

## 2018-01-15 MED ORDER — OSELTAMIVIR PHOSPHATE 30 MG PO CAPS
30.0000 mg | ORAL_CAPSULE | Freq: Two times a day (BID) | ORAL | Status: DC
  Administered 2018-01-15 – 2018-01-16 (×3): 30 mg via ORAL
  Filled 2018-01-15 (×3): qty 1

## 2018-01-15 MED ORDER — OSELTAMIVIR PHOSPHATE 75 MG PO CAPS: 75.0000 mg | ORAL_CAPSULE | Freq: Two times a day (BID) | ORAL | Status: DC

## 2018-01-15 MED ORDER — ORAL CARE MOUTH RINSE
15.0000 mL | Freq: Two times a day (BID) | OROMUCOSAL | Status: DC
  Administered 2018-01-15 – 2018-01-17 (×4): 15 mL via OROMUCOSAL

## 2018-01-15 MED ORDER — METHYLPREDNISOLONE SODIUM SUCC 40 MG IJ SOLR
40.0000 mg | Freq: Three times a day (TID) | INTRAMUSCULAR | Status: DC
  Administered 2018-01-15 – 2018-01-16 (×3): 40 mg via INTRAVENOUS
  Filled 2018-01-15 (×3): qty 1

## 2018-01-15 NOTE — Progress Notes (Signed)
  Echocardiogram 2D Echocardiogram has been performed.  Jennette Dubin 01/15/2018, 2:09 PM

## 2018-01-15 NOTE — Progress Notes (Signed)
Patient ID: [name], female   DOB: 01/14/1951, 67 y.o.   MRN: [id number]  PROGRESS NOTE    [name]  HGD:[id number] DOB: 19-Aug-1951 DOA: 01/14/2018 PCP: [facility]   Brief Narrative:  67 year old female with history of COPD, ongoing tobacco use and systolic heart failure presented with worsening shortness of breath.  Patient was admitted with hypoxia secondary to CHF and COPD exacerbation.   Assessment & Plan:   Principal Problem:   Acute on chronic combined systolic and diastolic CHF (congestive heart failure) (HCC) Active Problems:   Essential hypertension   Coronary atherosclerosis   Tobacco abuse   AICD (automatic cardioverter/defibrillator) present   Hyperlipidemia   COPD (chronic obstructive pulmonary disease) (HCC)  Hypoxia: -Probably from COPD and CHF exacerbation and influenza bronchitis -Continue oxygen supplementation and wean off as able  -Currently requiring 2 L oxygen via nasal cannula  Acute on chronic combined CHF in a patient with history of CAD s/p MI , s/p AICD:  - Echo Oct 2017 showed EF 96-22%, grade 1 diastolic dysfunction, diffuse hypokinesis, followed by Dr. Jeffie Pollock.   -Continue Lasix.  Strict input and output.  Daily weights. - Continue home medications entresto, spironolactone, imdur, ASA -Follow echocardiogram -Outpatient follow-up with cardiology/heart failure team  Mildly positive troponin -Probably secondary to hypoxia and CHF exacerbation. -Currently no chest pain.  Cycle troponins.  If troponins increase, might consider cardiology evaluation  COPD exacerbation:  -Start Solu-Medrol 40 milligrams IV every 8 hours.  Continue duo nebs and Dulera  Influenza A probable acute bronchitis -Continue Tamiflu  Tobacco use:  - Cessation counseling provided  Hyperlipidemia:  - Takes statin irregularly, will not administer for now  Depression: Chronic, stable.  - continue home medication  DVT  prophylaxis: Lovenox  Code Status: Full  Family Communication: None at bedside Disposition Plan:  Home in 1-3 days  Consultants: None  Procedures: None  Antimicrobials: Tamiflu from 01/15/2018 onwards   Subjective: Patient seen and examined at bedside.  She feels slightly better but is still short of breath and coughing.  No overnight fever or vomiting.  No current chest pain.  Objective: Vitals:   01/15/18 0200 01/15/18 0444 01/15/18 0446 01/15/18 0600  BP:  (!) 103/50    Pulse:  95    Resp:  20    Temp:  97.8 F (36.6 C)    TempSrc:  Oral    SpO2: 98% 98%    Weight:   58.6 kg (129 lb 3 oz)   Height:    5\' 3"  (1.6 m)    Intake/Output Summary (Last 24 hours) at 01/15/2018 1024 Last data filed at 01/15/2018 1023 Gross per 24 hour  Intake 360 ml  Output 200 ml  Net 160 ml   Filed Weights   01/14/18 1758 01/15/18 0446  Weight: 58.6 kg (129 lb 1.6 oz) 58.6 kg (129 lb 3 oz)    Examination:  General exam: Appears calm and comfortable  Respiratory system: Bilateral decreased breath sound at bases with some scattered crackles Cardiovascular system: S1 & S2 heard, rate controlled  gastrointestinal system: Abdomen is nondistended, soft and nontender. Normal bowel sounds heard. Central nervous system: Alert and oriented. No focal neurological deficits. Moving extremities Extremities: No cyanosis, clubbing; trace edema  Skin: No rashes, lesions or ulcers Lymph: No cervical lymphadenopathy    Data Reviewed: I have personally reviewed following labs and imaging studies  CBC: Recent Labs  Lab 01/14/18 1254  WBC 10.6*  NEUTROABS 9.2*  HGB 11.9*  HCT 34.2*  MCV 94.2  PLT 353   Basic Metabolic Panel: Recent Labs  Lab 01/14/18 1254 01/15/18 0517  NA 134* 134*  K 4.1 4.1  CL 100* 100*  CO2 23 26  GLUCOSE 117* 161*  BUN 17 23*  CREATININE 1.19* 1.27*  CALCIUM 9.4 9.1   GFR: Estimated Creatinine Clearance: 35.1 mL/min (A) (by C-G formula based on SCr of 1.27  mg/dL (H)). Liver Function Tests: No results for input(s): AST, ALT, ALKPHOS, BILITOT, PROT, ALBUMIN in the last 168 hours. No results for input(s): LIPASE, AMYLASE in the last 168 hours. No results for input(s): AMMONIA in the last 168 hours. Coagulation Profile: No results for input(s): INR, PROTIME in the last 168 hours. Cardiac Enzymes: Recent Labs  Lab 01/14/18 2223 01/15/18 0738  TROPONINI 0.25* 0.25*   BNP (last 3 results) No results for input(s): PROBNP in the last 8760 hours. HbA1C: No results for input(s): HGBA1C in the last 72 hours. CBG: No results for input(s): GLUCAP in the last 168 hours. Lipid Profile: No results for input(s): CHOL, HDL, LDLCALC, TRIG, CHOLHDL, LDLDIRECT in the last 72 hours. Thyroid Function Tests: No results for input(s): TSH, T4TOTAL, FREET4, T3FREE, THYROIDAB in the last 72 hours. Anemia Panel: No results for input(s): VITAMINB12, FOLATE, FERRITIN, TIBC, IRON, RETICCTPCT in the last 72 hours. Sepsis Labs: No results for input(s): PROCALCITON, LATICACIDVEN in the last 168 hours.  Recent Results (from the past 240 hour(s))  Respiratory Panel by PCR     Status: Abnormal   Collection Time: 01/14/18  2:11 PM  Result Value Ref Range Status   Adenovirus NOT DETECTED NOT DETECTED Final   Coronavirus 229E NOT DETECTED NOT DETECTED Final   Coronavirus HKU1 NOT DETECTED NOT DETECTED Final   Coronavirus NL63 NOT DETECTED NOT DETECTED Final   Coronavirus OC43 NOT DETECTED NOT DETECTED Final   Metapneumovirus NOT DETECTED NOT DETECTED Final   Rhinovirus / Enterovirus NOT DETECTED NOT DETECTED Final   Influenza A NOT DETECTED NOT DETECTED Final   Influenza A H1 NOT DETECTED NOT DETECTED Final   Influenza A H1 2009 NOT DETECTED NOT DETECTED Final   Influenza A H3 DETECTED (A) NOT DETECTED Final   Influenza B NOT DETECTED NOT DETECTED Final   Parainfluenza Virus 1 NOT DETECTED NOT DETECTED Final   Parainfluenza Virus 2 NOT DETECTED NOT DETECTED Final     Parainfluenza Virus 3 NOT DETECTED NOT DETECTED Final   Parainfluenza Virus 4 NOT DETECTED NOT DETECTED Final   Respiratory Syncytial Virus NOT DETECTED NOT DETECTED Final   Bordetella pertussis NOT DETECTED NOT DETECTED Final   Chlamydophila pneumoniae NOT DETECTED NOT DETECTED Final   Mycoplasma pneumoniae NOT DETECTED NOT DETECTED Final    Comment: Performed at [facility], [address], [place], [place] [address]         Radiology Studies: No results found.      Scheduled Meds: . aspirin EC  81 mg Oral Daily  . azelastine  1 spray Each Nare Daily  . citalopram  20 mg Oral Daily  . enoxaparin (LOVENOX) injection  40 mg Subcutaneous Q24H  . furosemide  40 mg Intravenous BID  . ipratropium-albuterol  3 mL Nebulization Q6H  . isosorbide mononitrate  30 mg Oral Daily  . loratadine  10 mg Oral Daily  . mouth rinse  15 mL Mouth Rinse BID  . mometasone-formoterol  2 puff Inhalation BID  . oseltamivir  30 mg Oral BID  .  pantoprazole  40 mg Oral Daily  . polyethylene glycol  17 g Oral QHS  . sacubitril-valsartan  1 tablet Oral BID  . sodium chloride flush  3 mL Intravenous Q12H  . spironolactone  25 mg Oral Daily   Continuous Infusions:   LOS: 1 day        Aline August, MD [facility] Pager [phone]  If 7PM-7AM, please contact night-coverage [email or web address] Password [employer] 01/15/2018, 10:24 AM

## 2018-01-16 ENCOUNTER — Inpatient Hospital Stay (HOSPITAL_COMMUNITY): Payer: Medicare Other

## 2018-01-16 DIAGNOSIS — N183 Chronic kidney disease, stage 3 (moderate): Secondary | ICD-10-CM

## 2018-01-16 LAB — CBC WITH DIFFERENTIAL/PLATELET
Basophils Absolute: 0 10*3/uL (ref 0.0–0.1)
Basophils Relative: 0 %
Eosinophils Absolute: 0 10*3/uL (ref 0.0–0.7)
Eosinophils Relative: 0 %
HEMATOCRIT: 32.6 % — AB (ref 36.0–46.0)
HEMOGLOBIN: 11.1 g/dL — AB (ref 12.0–15.0)
LYMPHS ABS: 0.4 10*3/uL — AB (ref 0.7–4.0)
LYMPHS PCT: 4 %
MCH: 32.4 pg (ref 26.0–34.0)
MCHC: 34 g/dL (ref 30.0–36.0)
MCV: 95 fL (ref 78.0–100.0)
Monocytes Absolute: 0.4 10*3/uL (ref 0.1–1.0)
Monocytes Relative: 5 %
NEUTROS ABS: 7.3 10*3/uL (ref 1.7–7.7)
NEUTROS PCT: 91 %
Platelets: 154 10*3/uL (ref 150–400)
RBC: 3.43 MIL/uL — ABNORMAL LOW (ref 3.87–5.11)
RDW: 13.7 % (ref 11.5–15.5)
WBC: 8 10*3/uL (ref 4.0–10.5)

## 2018-01-16 LAB — BASIC METABOLIC PANEL
Anion gap: 9 (ref 5–15)
BUN: 35 mg/dL — AB (ref 6–20)
CHLORIDE: 100 mmol/L — AB (ref 101–111)
CO2: 25 mmol/L (ref 22–32)
Calcium: 9.2 mg/dL (ref 8.9–10.3)
Creatinine, Ser: 1.54 mg/dL — ABNORMAL HIGH (ref 0.44–1.00)
GFR calc Af Amer: 39 mL/min — ABNORMAL LOW (ref >60)
GFR calc non Af Amer: 34 mL/min — ABNORMAL LOW (ref >60)
GLUCOSE: 159 mg/dL — AB (ref 65–99)
Potassium: 3.9 mmol/L (ref 3.5–5.1)
Sodium: 134 mmol/L — ABNORMAL LOW (ref 135–145)

## 2018-01-16 LAB — MAGNESIUM: Magnesium: 1.9 mg/dL (ref 1.7–2.4)

## 2018-01-16 MED ORDER — IPRATROPIUM-ALBUTEROL 0.5-2.5 (3) MG/3ML IN SOLN
3.0000 mL | Freq: Three times a day (TID) | RESPIRATORY_TRACT | Status: DC
  Administered 2018-01-17: 3 mL via RESPIRATORY_TRACT
  Filled 2018-01-16: qty 3

## 2018-01-16 MED ORDER — OSELTAMIVIR PHOSPHATE 30 MG PO CAPS
30.0000 mg | ORAL_CAPSULE | Freq: Every day | ORAL | Status: DC
  Filled 2018-01-16: qty 1

## 2018-01-16 MED ORDER — METHYLPREDNISOLONE SODIUM SUCC 40 MG IJ SOLR
40.0000 mg | Freq: Two times a day (BID) | INTRAMUSCULAR | Status: DC
  Administered 2018-01-16 – 2018-01-17 (×2): 40 mg via INTRAVENOUS
  Filled 2018-01-16 (×2): qty 1

## 2018-01-16 MED ORDER — ENOXAPARIN SODIUM 30 MG/0.3ML ~~LOC~~ SOLN
30.0000 mg | SUBCUTANEOUS | Status: DC
  Administered 2018-01-16: 30 mg via SUBCUTANEOUS
  Filled 2018-01-16: qty 0.3

## 2018-01-16 NOTE — Plan of Care (Signed)
Cont to mon 

## 2018-01-16 NOTE — Progress Notes (Addendum)
Patient ID: [name], female   DOB: 09/02/1951, 67 y.o.   MRN: [id number]  PROGRESS NOTE    [name]  HAL:[id number] DOB: 1951/02/13 DOA: 01/14/2018 PCP: [facility]   Brief Narrative:  67 year old female with history of COPD, ongoing tobacco use and systolic heart failure presented with worsening shortness of breath.  Patient was admitted with hypoxia secondary to CHF and COPD exacerbation.   Assessment & Plan:   Principal Problem:   Acute on chronic combined systolic and diastolic CHF (congestive heart failure) (HCC) Active Problems:   Essential hypertension   Coronary atherosclerosis   Tobacco abuse   AICD (automatic cardioverter/defibrillator) present   Hyperlipidemia   COPD (chronic obstructive pulmonary disease) (HCC)  Hypoxia: -Probably from COPD and CHF exacerbation and influenza bronchitis -Improving.  Currently on room air  Acute on chronic combined CHF in a patient with history of CAD s/p MI , s/p AICD:  - Echo Oct 2017 showed EF 73-53%, grade 1 diastolic dysfunction, diffuse hypokinesis, followed by Dr. Jeffie Pollock.   -Echo on admission showed ejection fraction of 20-25% with grade 3 diastolic dysfunction -Hold Lasix today because of slight bump in creatinine.  Repeat a.m. creatinine.  Strict input and output.  Daily weights.  Negative balance of 1460 mL since admission. - Continue home medications entresto, spironolactone, imdur, ASA -Outpatient follow-up with cardiology/heart failure team  Mildly positive troponin -Probably secondary to hypoxia and CHF exacerbation. -Currently no chest pain.  Troponins did not trend upwards.  Spoke to Dr. Nishan/cardiology on phone about the patient and he recommended to continue current medical management and outpatient follow-up with cardiology/heart failure team.  He recommended that there was no need for any further intervention inpatient for troponin elevation.  Chronic kidney disease stage  III -Creatinine is slightly elevated since yesterday.  Hold Lasix.  Repeat a.m. labs  COPD exacerbation:  -Wheezing is improving.  Change Solu-Medrol to 40 milligrams IV every 12 hours and switch to oral prednisone tomorrow.  Continue duo nebs and Dulera  Influenza A probable acute bronchitis -Continue Tamiflu  Tobacco use:  - Cessation counseling provided  Hyperlipidemia:  - Takes statin irregularly, will not administer for now  Depression: Chronic, stable.  - continue home medication  DVT prophylaxis: Lovenox  Code Status: Full  Family Communication:  Spoke to sister at bedside Disposition Plan:  Home in 1-2 days  Consultants: Spoke to cardiology/Dr. Johnsie Cancel on phone on 01/16/2018  Procedures: Study Conclusions  - Left ventricle: Systolic function was severely reduced. The   estimated ejection fraction was in the range of 20% to 25%.   Diffuse hypokinesis. Doppler parameters are consistent with a   reversible restrictive pattern, indicative of decreased left   ventricular diastolic compliance and/or increased left atrial   pressure (grade 3 diastolic dysfunction). - Mitral valve: There was moderate regurgitation. - Left atrium: The atrium was severely dilated. - Pulmonary arteries: Systolic pressure was mildly to moderately   increased. PA peak pressure: 40 mm Hg (S).  Antimicrobials: Tamiflu from 01/15/2018 onwards   Subjective: Patient seen and examined at bedside.  She feels better but is still short of breath with exertion.  No overnight fever or vomiting.  No current chest pain.  Objective: Vitals:   01/15/18 1953 01/15/18 1954 01/16/18 0500 01/16/18 0520  BP:    110/81  Pulse:    99  Resp:    16  Temp:    98 F (36.7 C)  TempSrc:    Oral  SpO2: 96% 96%  100%  Weight:   55.5 kg (122 lb 5.7 oz)   Height:        Intake/Output Summary (Last 24 hours) at 01/16/2018 1147 Last data filed at 01/16/2018 8295 Gross per 24 hour  Intake 600 ml  Output 2020 ml   Net -1420 ml   Filed Weights   01/14/18 1758 01/15/18 0446 01/16/18 0500  Weight: 58.6 kg (129 lb 1.6 oz) 58.6 kg (129 lb 3 oz) 55.5 kg (122 lb 5.7 oz)    Examination:  General exam: Appears calm and comfortable  Respiratory system: Bilateral decreased breath sound at bases with some scattered crackles, very minimal wheezing Cardiovascular system: S1 & S2 heard, rate controlled  gastrointestinal system: Abdomen is nondistended, soft and nontender. Normal bowel sounds heard. Extremities: No cyanosis, clubbing; trace edema     Data Reviewed: I have personally reviewed following labs and imaging studies  CBC: Recent Labs  Lab 01/14/18 1254 01/16/18 0459  WBC 10.6* 8.0  NEUTROABS 9.2* 7.3  HGB 11.9* 11.1*  HCT 34.2* 32.6*  MCV 94.2 95.0  PLT 169 621   Basic Metabolic Panel: Recent Labs  Lab 01/14/18 1254 01/15/18 0517 01/16/18 0459  NA 134* 134* 134*  K 4.1 4.1 3.9  CL 100* 100* 100*  CO2 23 26 25   GLUCOSE 117* 161* 159*  BUN 17 23* 35*  CREATININE 1.19* 1.27* 1.54*  CALCIUM 9.4 9.1 9.2  MG  --   --  1.9   GFR: Estimated Creatinine Clearance: 28.9 mL/min (A) (by C-G formula based on SCr of 1.54 mg/dL (H)). Liver Function Tests: No results for input(s): AST, ALT, ALKPHOS, BILITOT, PROT, ALBUMIN in the last 168 hours. No results for input(s): LIPASE, AMYLASE in the last 168 hours. No results for input(s): AMMONIA in the last 168 hours. Coagulation Profile: No results for input(s): INR, PROTIME in the last 168 hours. Cardiac Enzymes: Recent Labs  Lab 01/14/18 2223 01/15/18 0738 01/15/18 1327 01/15/18 2039  TROPONINI 0.25* 0.25* 0.34* 0.27*   BNP (last 3 results) No results for input(s): PROBNP in the last 8760 hours. HbA1C: No results for input(s): HGBA1C in the last 72 hours. CBG: No results for input(s): GLUCAP in the last 168 hours. Lipid Profile: No results for input(s): CHOL, HDL, LDLCALC, TRIG, CHOLHDL, LDLDIRECT in the last 72 hours. Thyroid  Function Tests: No results for input(s): TSH, T4TOTAL, FREET4, T3FREE, THYROIDAB in the last 72 hours. Anemia Panel: No results for input(s): VITAMINB12, FOLATE, FERRITIN, TIBC, IRON, RETICCTPCT in the last 72 hours. Sepsis Labs: No results for input(s): PROCALCITON, LATICACIDVEN in the last 168 hours.  Recent Results (from the past 240 hour(s))  Respiratory Panel by PCR     Status: Abnormal   Collection Time: 01/14/18  2:11 PM  Result Value Ref Range Status   Adenovirus NOT DETECTED NOT DETECTED Final   Coronavirus 229E NOT DETECTED NOT DETECTED Final   Coronavirus HKU1 NOT DETECTED NOT DETECTED Final   Coronavirus NL63 NOT DETECTED NOT DETECTED Final   Coronavirus OC43 NOT DETECTED NOT DETECTED Final   Metapneumovirus NOT DETECTED NOT DETECTED Final   Rhinovirus / Enterovirus NOT DETECTED NOT DETECTED Final   Influenza A NOT DETECTED NOT DETECTED Final   Influenza A H1 NOT DETECTED NOT DETECTED Final   Influenza A H1 2009 NOT DETECTED NOT DETECTED Final   Influenza A H3 DETECTED (A) NOT DETECTED Final   Influenza B NOT DETECTED NOT DETECTED Final   Parainfluenza Virus 1 NOT DETECTED  NOT DETECTED Final   Parainfluenza Virus 2 NOT DETECTED NOT DETECTED Final   Parainfluenza Virus 3 NOT DETECTED NOT DETECTED Final   Parainfluenza Virus 4 NOT DETECTED NOT DETECTED Final   Respiratory Syncytial Virus NOT DETECTED NOT DETECTED Final   Bordetella pertussis NOT DETECTED NOT DETECTED Final   Chlamydophila pneumoniae NOT DETECTED NOT DETECTED Final   Mycoplasma pneumoniae NOT DETECTED NOT DETECTED Final    Comment: Performed at [facility], [place] [address], [place], [place] [address]         Radiology Studies: Dg Chest 2 View  Result Date: 01/16/2018 CLINICAL DATA:  Cough and shortness of breath. EXAM: CHEST  2 VIEW COMPARISON:  01/12/2018 and prior chest radiographs FINDINGS: Cardiomegaly and left ICD again noted. There is no evidence of focal airspace disease, pulmonary  edema, suspicious pulmonary nodule/mass, pleural effusion, or pneumothorax. No acute bony abnormalities are identified. IMPRESSION: Cardiomegaly without evidence of acute cardiopulmonary disease. Electronically Signed   By: Margarette Canada M.D.   On: 01/16/2018 09:23        Scheduled Meds: . aspirin EC  81 mg Oral Daily  . azelastine  1 spray Each Nare Daily  . citalopram  20 mg Oral Daily  . enoxaparin (LOVENOX) injection  40 mg Subcutaneous Q24H  . ipratropium-albuterol  3 mL Nebulization Q6H  . isosorbide mononitrate  30 mg Oral Daily  . loratadine  10 mg Oral Daily  . mouth rinse  15 mL Mouth Rinse BID  . methylPREDNISolone (SOLU-MEDROL) injection  40 mg Intravenous Q8H  . mometasone-formoterol  2 puff Inhalation BID  . [START ON 01/17/2018] oseltamivir  30 mg Oral Daily  . pantoprazole  40 mg Oral Daily  . polyethylene glycol  17 g Oral QHS  . sacubitril-valsartan  1 tablet Oral BID  . sodium chloride flush  3 mL Intravenous Q12H  . spironolactone  25 mg Oral Daily   Continuous Infusions:   LOS: 2 days        Aline August, MD [facility] Pager [phone]  If 7PM-7AM, please contact night-coverage [email or web address] Password [email or web address] 01/16/2018, 11:47 AM

## 2018-01-17 DIAGNOSIS — I5043 Acute on chronic combined systolic (congestive) and diastolic (congestive) heart failure: Secondary | ICD-10-CM

## 2018-01-17 LAB — CBC WITH DIFFERENTIAL/PLATELET
Basophils Absolute: 0 10*3/uL (ref 0.0–0.1)
Basophils Relative: 0 %
EOS ABS: 0 10*3/uL (ref 0.0–0.7)
Eosinophils Relative: 0 %
HCT: 32.2 % — ABNORMAL LOW (ref 36.0–46.0)
Hemoglobin: 11 g/dL — ABNORMAL LOW (ref 12.0–15.0)
Lymphocytes Relative: 7 %
Lymphs Abs: 0.4 10*3/uL — ABNORMAL LOW (ref 0.7–4.0)
MCH: 32.6 pg (ref 26.0–34.0)
MCHC: 34.2 g/dL (ref 30.0–36.0)
MCV: 95.5 fL (ref 78.0–100.0)
MONO ABS: 0.2 10*3/uL (ref 0.1–1.0)
MONOS PCT: 3 %
NEUTROS ABS: 5.6 10*3/uL (ref 1.7–7.7)
NEUTROS PCT: 90 %
PLATELETS: 156 10*3/uL (ref 150–400)
RBC: 3.37 MIL/uL — ABNORMAL LOW (ref 3.87–5.11)
RDW: 13.6 % (ref 11.5–15.5)
WBC: 6.3 10*3/uL (ref 4.0–10.5)

## 2018-01-17 LAB — BASIC METABOLIC PANEL WITH GFR
Anion gap: 10 (ref 5–15)
BUN: 32 mg/dL — ABNORMAL HIGH (ref 6–20)
CO2: 23 mmol/L (ref 22–32)
Calcium: 9.2 mg/dL (ref 8.9–10.3)
Chloride: 99 mmol/L — ABNORMAL LOW (ref 101–111)
Creatinine, Ser: 1.32 mg/dL — ABNORMAL HIGH (ref 0.44–1.00)
GFR calc Af Amer: 47 mL/min — ABNORMAL LOW
GFR calc non Af Amer: 40 mL/min — ABNORMAL LOW
Glucose, Bld: 168 mg/dL — ABNORMAL HIGH (ref 65–99)
Potassium: 4.9 mmol/L (ref 3.5–5.1)
Sodium: 132 mmol/L — ABNORMAL LOW (ref 135–145)

## 2018-01-17 LAB — MAGNESIUM: Magnesium: 2 mg/dL (ref 1.7–2.4)

## 2018-01-17 MED ORDER — OSELTAMIVIR PHOSPHATE 30 MG PO CAPS
30.0000 mg | ORAL_CAPSULE | Freq: Two times a day (BID) | ORAL | Status: DC
  Administered 2018-01-17: 30 mg via ORAL
  Filled 2018-01-17: qty 1

## 2018-01-17 MED ORDER — PREDNISONE 10 MG PO TABS: 10.0000 mg | ORAL_TABLET | Freq: Every day | ORAL | 0 refills | Status: DC

## 2018-01-17 MED ORDER — OSELTAMIVIR PHOSPHATE 30 MG PO CAPS: 30.0000 mg | ORAL_CAPSULE | Freq: Two times a day (BID) | ORAL | 0 refills | Status: AC

## 2018-01-17 MED ORDER — SPIRONOLACTONE 25 MG PO TABS: 25.0000 mg | ORAL_TABLET | Freq: Every day | ORAL | 0 refills | Status: DC

## 2018-01-17 NOTE — Discharge Summary (Signed)
Discharge Summary  [name] ZJI:[id number] DOB: 08/25/1951  PCP: [place], [place] date: 01/14/2018 Discharge date: 01/17/2018  Time spent: >95mins, more than 50% time spent on coordination of care.  Recommendations for Outpatient Follow-up:  1. F/u with PMD within a week  for hospital discharge follow up, repeat cbc/bmp at follow up 2. F/u with cardiology for heart failure management  Discharge Diagnoses:  Active Hospital Problems   Diagnosis Date Noted  . Acute on chronic combined systolic and diastolic CHF (congestive heart failure) ([place]) 01/14/2018  . COPD (chronic obstructive pulmonary disease) ([place]) 10/10/2013  . Hyperlipidemia 06/22/2013  . AICD (automatic cardioverter/defibrillator) present 09/17/2011  . Tobacco abuse 01/30/2011  . Coronary atherosclerosis 05/01/2009  . Essential hypertension 05/01/2009    Resolved Hospital Problems  No resolved problems to display.    Discharge Condition: stable  Diet recommendation: heart healthy  Filed Weights   01/15/18 0446 01/16/18 0500 01/17/18 1751  Weight: 58.6 kg (129 lb 3 oz) 55.5 kg (122 lb 5.7 oz) 58.2 kg (128 lb 3.2 oz)    History of present illness:  ( per admitting MD Dr Bonner Puna) PCP: [facility] [place] [facility] Outpatient Specialists: Cardiology, [facility]  Patient coming from: Home  Chief Complaint: Shortness of breath  HPI: [name] is a 67 y.o. female with a history of COPD, ongoing tobacco use, and systolic heart failure who presented to the ED for worsening dyspnea and orthopnea. She began with URI symptoms about a week ago that worsened, prompting her to see her PCP earlier in the week and again 2 days ago when she was prescribed an antibiotic and prednisone. She reluctantly took these medications, though she says prednisone is hard on her stomach. She has noticed increasing weights from 128lbs > 131lbs over the past few days, increasing abdominal  distention and orthopnea since starting steroids. She also notes some wheezing that wasn't improved with albuterol inhaler but has responded to nebulized therapy here. A dry cough has developed as well with pleuritic chest pain.  ED Course: Afebrile, tachypneic with sinus tachycardia and increased work of breathing and hypoxia on EDP exam. Declined CXR, though imaging 2 days ago did not show infiltrate. WBC 10.6, Hgb 11.9. Creatinine 1.19 and BNP >2k. Hospitalists consulted for admission for heart failure. Viral panel pending.      Hospital Course:  Principal Problem:   Acute on chronic combined systolic and diastolic CHF (congestive heart failure) (HCC) Active Problems:   Essential hypertension   Coronary atherosclerosis   Tobacco abuse   AICD (automatic cardioverter/defibrillator) present   Hyperlipidemia   COPD (chronic obstructive pulmonary disease) (HCC)   Acute hypoxic respiratory failure:  -Pt was in upper 80%'s on room air, she refused supplemental oxygen.she received  continuous neb while in the ED for increase wob and wheezing -acute hypoxia likely from from  influenza bronchitis/COPD. She dose not look volume overload on exam, cxr no acute findings, no edema on exam -hypoxia resolved, she ambulated in hallway on room air without difficulty at discharge.  Influenza A  With acute bronchitis/COPD exacerbation:  -she is started on  Tamiflu, continue to finish total of 5 days treatment -she received iv solumedrol in the hospital ( she refused prednisone due to intolerance/allergy), nebs. Wheezing has mostly resolved, she is now on room air -albuterol, she has symbicort and breo listed on her home meds list.   chronic combined CHF in a patient with history of CAD s/p MI , s/p AICD:  -  Echo Oct 2017 showed EF 32-20%, grade 1 diastolic dysfunction, diffuse hypokinesis, followed by Dr. Jeffie Pollock.   -Echo on admission showed ejection fraction of 20-25% with grade 3 diastolic  dysfunction - Continue home medications entresto, spironolactone, lasix, imdur, ASA, fish oil, statin ( she reports Takes statin irregularly) -she does not appear volume overload on exam. Her respiratory symptom on presentation likely from flu/bronchitis/copd.  -Outpatient follow-up with cardiology/heart failure team  Mildly positive troponin -troponin peaked at 0.34 and trend down, troponin elevation Probably demand ischemia - no chest pain.   -Dr Marisa Severin to Dr. Nishan/cardiology on phone about the patient on 2/10, Dr Johnsie Cancel recommended to continue current medical management and outpatient follow-up with cardiology/heart failure team.  He recommended that there was no need for any further intervention inpatient for troponin elevation.  Chronic kidney disease stage III -cr peaked at 1.54, trending down at discharge -continue home meds, repeat bmp at hospital discharge follow up.  Depression: Chronic, stable.  - continue home medication  Tobacco use:  - Cessation counseling provided    DVT prophylaxis while in the hospital:Lovenox Code Status:Full Family Communication: Spoke to sister at bedside Disposition Plan: Home   Consultants: my Hospitalist colleague Dr Marisa Severin to cardiology/Dr. Johnsie Cancel on phone on 01/16/2018   Discharge Exam: BP (!) 111/59 (BP Location: Right Arm)   Pulse 91   Temp 98.3 F (36.8 C) (Oral)   Resp 18   Ht 5\' 3"  (1.6 m)   Wt 58.2 kg (128 lb 3.2 oz)   SpO2 98%   BMI 22.71 kg/m   General: NAD Cardiovascular: RRR Respiratory: good aeration, very mild intermittent wheezing at basis No edema  Discharge Instructions You were cared for by a hospitalist during your hospital stay. If you have any questions about your discharge medications or the care you received while you were in the hospital after you are discharged, you can call the unit and asked to speak with the hospitalist on call if the hospitalist that took care of you is not  available. Once you are discharged, your primary care physician will handle any further medical issues. Please note that NO REFILLS for any discharge medications will be authorized once you are discharged, as it is imperative that you return to your primary care physician (or establish a relationship with a primary care physician if you do not have one) for your aftercare needs so that they can reassess your need for medications and monitor your lab values.  Discharge Instructions    Diet - low sodium heart healthy   Complete by:  As directed    Increase activity slowly   Complete by:  As directed      Allergies as of 01/17/2018      Reactions   Prednisone Shortness Of Breath, Rash   Zebeta [bisoprolol Fumarate]    Pt states she couldn't think and it messed her head up   Statins Other (See Comments)   Gradually tired with daily Lipitor, made pt feel bad (Pt can take Crestor 5 mg 4 times per week and Zetia 5 mg every other day)      Medication List    TAKE these medications   aspirin 81 MG tablet Take 81 mg by mouth daily.   azelastine 0.1 % nasal spray Commonly known as:  ASTELIN Place 1 spray into both nostrils daily. Use in each nostril as directed   citalopram 20 MG tablet Commonly known as:  CELEXA Take 20 mg by mouth daily.  doxycycline 100 MG tablet Commonly known as:  VIBRA-TABS Take 100 mg by mouth 2 (two) times daily.   ezetimibe 10 MG tablet Commonly known as:  ZETIA TAKE 1/2 TABLET TWICE A WEEK   fexofenadine 180 MG tablet Commonly known as:  ALLEGRA Take 180 mg by mouth daily as needed for allergies.   fish oil-omega-3 fatty acids 1000 MG capsule Take 2 g by mouth daily.   fluconazole 40 MG/ML suspension Commonly known as:  DIFLUCAN Take 200 mg by mouth. SWISH AND SWALLOW 4 TIMES DAILY   fluticasone furoate-vilanterol 100-25 MCG/INH Aepb Commonly known as:  BREO ELLIPTA Inhale 1 puff into the lungs daily as needed for shortness of breath or  wheezing.   furosemide 20 MG tablet Commonly known as:  LASIX TAKE 2 TABLETS BY MOUTH DAILY. MAY TAKE ADDITIONAL 2 TABS AS NEEDED FOR SWELLING/ SHORT OF BREATH   isosorbide mononitrate 30 MG 24 hr tablet Commonly known as:  IMDUR Take 30 mg by mouth daily.   nitroGLYCERIN 0.4 MG SL tablet Commonly known as:  NITROSTAT Place 1 tablet (0.4 mg total) under the tongue every 5 (five) minutes as needed for chest pain.   oseltamivir 30 MG capsule Commonly known as:  TAMIFLU Take 1 capsule (30 mg total) by mouth 2 (two) times daily for 2 days.   pantoprazole 40 MG tablet Commonly known as:  PROTONIX Take 40 mg by mouth daily.   polyethylene glycol packet Commonly known as:  MIRALAX / GLYCOLAX Take 17 g by mouth at bedtime.   predniSONE 10 MG tablet Commonly known as:  DELTASONE Take 1 tablet (10 mg total) by mouth daily. What changed:  See the new instructions.   predniSONE 10 MG tablet Commonly known as:  DELTASONE Take 1 tablet (10 mg total) by mouth daily. What changed:  You were already taking a medication with the same name, and this prescription was added. Make sure you understand how and when to take each.   PROAIR HFA 108 (90 Base) MCG/ACT inhaler Generic drug:  albuterol Inhale 2 puffs into the lungs every 6 (six) hours as needed for wheezing.   rosuvastatin 5 MG tablet Commonly known as:  CRESTOR TAKE 1 TABLET BY MOUTH EVERY DAY ON MONDAY, WEDNESDAY,AND FRIDAY   sacubitril-valsartan 49-51 MG Commonly known as:  ENTRESTO Take 1 tablet by mouth 2 (two) times daily.   spironolactone 25 MG tablet Commonly known as:  ALDACTONE Take 1 tablet (25 mg total) by mouth daily. What changed:    when to take this  Another medication with the same name was removed. Continue taking this medication, and follow the directions you see here.   SYMBICORT 160-4.5 MCG/ACT inhaler Generic drug:  budesonide-formoterol Inhale 2 puffs into the lungs 2 (two) times daily.   TYLENOL  ARTHRITIS PAIN 650 MG CR tablet Generic drug:  acetaminophen Take 650 mg by mouth every 8 (eight) hours as needed for pain.      Allergies  Allergen Reactions  . Prednisone Shortness Of Breath and Rash  . Zebeta [Bisoprolol Fumarate]     Pt states she couldn't think and it messed her head up  . Statins Other (See Comments)    Gradually tired with daily Lipitor, made pt feel bad (Pt can take Crestor 5 mg 4 times per week and Zetia 5 mg every other day)   Follow-up Information    [facility] [place] [facility] Follow up in 1 week(s).   Specialty:  Family Medicine Why:  hospital  discharge follow up, repeat cbc/bmp at follow up. Contact information: [address] [place] [address] [place] [address] [phone]        [facility]ensim[facility], Shaune Pascal, MD Follow up in 3 week(s).   Specialty:  Cardiology Why:  heart failure Contact information: [address] [place] [address] [phone]            The results of significant diagnostics from this hospitalization (including imaging, microbiology, ancillary and laboratory) are listed below for reference.    Significant Diagnostic Studies: Dg Chest 2 View  Result Date: 01/16/2018 CLINICAL DATA:  Cough and shortness of breath. EXAM: CHEST  2 VIEW COMPARISON:  01/12/2018 and prior chest radiographs FINDINGS: Cardiomegaly and left ICD again noted. There is no evidence of focal airspace disease, pulmonary edema, suspicious pulmonary nodule/mass, pleural effusion, or pneumothorax. No acute bony abnormalities are identified. IMPRESSION: Cardiomegaly without evidence of acute cardiopulmonary disease. Electronically Signed   By: Margarette Canada M.D.   On: 01/16/2018 09:23   Dg Chest 2 View  Result Date: 01/12/2018 CLINICAL DATA:  Cough and wheezing. EXAM: CHEST  2 VIEW COMPARISON:  February 27, 2015 FINDINGS: A single lead AICD device is identified. Mild cardiomegaly. The hila and mediastinum are normal. No pneumothorax. No  pulmonary nodules or masses. No focal infiltrates. IMPRESSION: No active cardiopulmonary disease. Electronically Signed   By: Dorise Bullion III M.D   On: 01/12/2018 22:23    Microbiology: Recent Results (from the past 240 hour(s))  Respiratory Panel by PCR     Status: Abnormal   Collection Time: 01/14/18  2:11 PM  Result Value Ref Range Status   Adenovirus NOT DETECTED NOT DETECTED Final   Coronavirus 229E NOT DETECTED NOT DETECTED Final   Coronavirus HKU1 NOT DETECTED NOT DETECTED Final   Coronavirus NL63 NOT DETECTED NOT DETECTED Final   Coronavirus OC43 NOT DETECTED NOT DETECTED Final   Metapneumovirus NOT DETECTED NOT DETECTED Final   Rhinovirus / Enterovirus NOT DETECTED NOT DETECTED Final   Influenza A NOT DETECTED NOT DETECTED Final   Influenza A H1 NOT DETECTED NOT DETECTED Final   Influenza A H1 2009 NOT DETECTED NOT DETECTED Final   Influenza A H3 DETECTED (A) NOT DETECTED Final   Influenza B NOT DETECTED NOT DETECTED Final   Parainfluenza Virus 1 NOT DETECTED NOT DETECTED Final   Parainfluenza Virus 2 NOT DETECTED NOT DETECTED Final   Parainfluenza Virus 3 NOT DETECTED NOT DETECTED Final   Parainfluenza Virus 4 NOT DETECTED NOT DETECTED Final   Respiratory Syncytial Virus NOT DETECTED NOT DETECTED Final   Bordetella pertussis NOT DETECTED NOT DETECTED Final   Chlamydophila pneumoniae NOT DETECTED NOT DETECTED Final   Mycoplasma pneumoniae NOT DETECTED NOT DETECTED Final    Comment: Performed at [facility], [address] [place], [place] [address]     Labs: Basic Metabolic Panel: Recent Labs  Lab 01/14/18 1254 01/15/18 0517 01/16/18 0459 01/17/18 0517  NA 134* 134* 134* 132*  K 4.1 4.1 3.9 4.9  CL 100* 100* 100* 99*  CO2 23 26 25 23   GLUCOSE 117* 161* 159* 168*  BUN 17 23* 35* 32*  CREATININE 1.19* 1.27* 1.54* 1.32*  CALCIUM 9.4 9.1 9.2 9.2  MG  --   --  1.9 2.0   Liver Function Tests: No results for input(s): AST, ALT, ALKPHOS, BILITOT, PROT,  ALBUMIN in the last 168 hours. No results for input(s): LIPASE, AMYLASE in the last 168 hours. No results for input(s):  AMMONIA in the last 168 hours. CBC: Recent Labs  Lab 01/14/18 1254 01/16/18 0459 01/17/18 0517  WBC 10.6* 8.0 6.3  NEUTROABS 9.2* 7.3 5.6  HGB 11.9* 11.1* 11.0*  HCT 34.2* 32.6* 32.2*  MCV 94.2 95.0 95.5  PLT 169 154 156   Cardiac Enzymes: Recent Labs  Lab 01/14/18 2223 01/15/18 0738 01/15/18 1327 01/15/18 2039  TROPONINI 0.25* 0.25* 0.34* 0.27*   BNP: BNP (last 3 results) Recent Labs    01/14/18 1236  BNP 2,051.6*    ProBNP (last 3 results) No results for input(s): PROBNP in the last 8760 hours.  CBG: No results for input(s): GLUCAP in the last 168 hours.     Signed:  Florencia Reasons MD, PhD  [facility] 01/17/2018, 2:07 PM

## 2018-01-17 NOTE — Care Management Note (Signed)
Case Management Note  Patient Details  Name: SHERREL PLOCH MRN: [id number] Date of Birth: 18-Sep-1951  Subjective/Objective:   No CM needs.                 Action/Plan:d/c home.   Expected Discharge Date:  01/17/18               Expected Discharge Plan:  Home/Self Care  In-House Referral:     Discharge planning Services  CM Consult  Post Acute Care Choice:    Choice offered to:     DME Arranged:    DME Agency:     HH Arranged:    HH Agency:     Status of Service:  Completed, signed off  If discussed at [employer] of Stay Meetings, dates discussed:    Additional Comments:  Dessa Phi, RN 01/17/2018, 10:59 AM

## 2018-01-17 NOTE — Progress Notes (Signed)
PHARMACY NOTE:  ANTIMICROBIAL RENAL DOSAGE ADJUSTMENT  Current antimicrobial regimen includes a mismatch between antimicrobial dosage and estimated renal function.  As per policy approved by the Pharmacy & Therapeutics and Medical Executive Committees, the antimicrobial dosage will be adjusted accordingly.  Current antimicrobial dosage:  tamiflu 30mg  daily   Indication: influenza  Renal Function:  Estimated Creatinine Clearance: 33.7 mL/min (A) (by C-G formula based on SCr of 1.32 mg/dL (H)). []      On intermittent HD, scheduled: []      On CRRT    Antimicrobial dosage has been changed to:  tamiflu 30mg  BID to complete 5 days  Additional comments:   Thank you for allowing pharmacy to be a part of this patient's care.  Doreene Eland, PharmD, BCPS.   Pager: [phone] 01/17/2018 8:08 AM

## 2018-01-24 ENCOUNTER — Telehealth: Payer: Self-pay

## 2018-01-24 NOTE — Telephone Encounter (Addendum)
**Note De-Identified [name] Obfuscation** I have completed a Rosuvastatin PA form that was faxed to [place] from [place] and faxed it back to them.

## 2018-01-27 NOTE — Telephone Encounter (Signed)
Received fax from [employer] regarding ROSUVASTATIN, it was approved for a tier reduction dates 10/26/2017-01/24/2019. I called patient and gave her this information.

## 2018-02-02 ENCOUNTER — Ambulatory Visit (INDEPENDENT_AMBULATORY_CARE_PROVIDER_SITE_OTHER): Payer: Medicare Other | Admitting: Internal Medicine

## 2018-02-02 ENCOUNTER — Encounter: Payer: Self-pay | Admitting: Internal Medicine

## 2018-02-02 VITALS — BP 124/62 | HR 83 | Ht 63.0 in | Wt 125.0 lb

## 2018-02-02 DIAGNOSIS — I2510 Atherosclerotic heart disease of native coronary artery without angina pectoris: Secondary | ICD-10-CM | POA: Diagnosis not present

## 2018-02-02 DIAGNOSIS — I5022 Chronic systolic (congestive) heart failure: Secondary | ICD-10-CM | POA: Diagnosis not present

## 2018-02-02 DIAGNOSIS — I509 Heart failure, unspecified: Secondary | ICD-10-CM

## 2018-02-02 DIAGNOSIS — Z720 Tobacco use: Secondary | ICD-10-CM

## 2018-02-02 LAB — CUP PACEART INCLINIC DEVICE CHECK
Brady Statistic RV Percent Paced: 0 %
HighPow Impedance: 82 Ohm
Implantable Lead Implant Date: 20100212
Implantable Lead Location: 753860
Implantable Lead Model: 6935
MDC IDC MSMT BATTERY VOLTAGE: 2.78 V
MDC IDC MSMT LEADCHNL RV IMPEDANCE VALUE: 584 Ohm
MDC IDC MSMT LEADCHNL RV SENSING INTR AMPL: 19.326 mV
MDC IDC PG IMPLANT DT: 20100212
MDC IDC SESS DTM: 20190227135428
MDC IDC SET LEADCHNL RV PACING AMPLITUDE: 2.5 V
MDC IDC SET LEADCHNL RV PACING PULSEWIDTH: 0.4 ms
MDC IDC SET LEADCHNL RV SENSING SENSITIVITY: 0.3 mV

## 2018-02-02 NOTE — Progress Notes (Signed)
PCP: [facility] Primary Cardiologist:  Dr Johnsie Cancel Also follows in advanced CHF clinic Primary EP: Dr Estevan Oaks is a 67 y.o. female who presents today for routine electrophysiology followup.  Since last being seen in our clinic, the patient reports doing reasonably well. She was recently hospitalized with flu.  She is making slow progress. Today, she denies symptoms of palpitations, chest pain, shortness of breath,  lower extremity edema, dizziness, presyncope, syncope, or ICD shocks.  The patient is otherwise without complaint today.   Past Medical History:  Diagnosis Date  . Arthritis    hands  . Asthma   . CAD (coronary artery disease)    a. s/p prior Ant MI, b. s/p prior POBA to LAD, Dx, RCA,;  c.  LHC (10/12):  dLAD 40, pCFX 30, OM1 50, pRCA 30;  d.  [facility] Myoview (11/14):  High risk study; inferior, anterior, apical defects; minimal reversibility toward the apex; consistent with prior infarct and very mild peri-infarct ischemia, EF 24%, inferior/apical/anterior akinesis  . Chronic systolic heart failure ([place])   . Depression   . H/O hiatal hernia   . HLD (hyperlipidemia)   . HTN (hypertension)   . Ischemic cardiomyopathy    MRI (12/09):  High scar burden, Inf and Apical AK, EF 24%.;  Echo (10/14): EF 15%, Gr 2 DD, mild to mod MR, mod LAE, RVSF mildly reduced, mod RAE, severe TR, PASP 49-53 (mod pulmonary HTN)  . MI (myocardial infarction) ([place])   . Tobacco abuse    Past Surgical History:  Procedure Laterality Date  . CARDIAC DEFIBRILLATOR PLACEMENT  01/18/2009   MDT single chamber ICD implanted by Dr Rayann Heman   . cardiac stents    . COLONOSCOPY  03/18/2012   Procedure: COLONOSCOPY;  Surgeon: Beryle Beams, MD;  Location: [facility];  Service: Endoscopy;  Laterality: N/A;  . CORONARY ANGIOPLASTY    . ESOPHAGOGASTRODUODENOSCOPY N/A 03/10/2013   Procedure: ESOPHAGOGASTRODUODENOSCOPY (EGD);  Surgeon: Beryle Beams, MD;  Location: [facility];  Service: Endoscopy;  Laterality: N/A;  . LEFT HEART CATHETERIZATION WITH CORONARY ANGIOGRAM N/A 07/26/2014   Procedure: LEFT HEART CATHETERIZATION WITH CORONARY ANGIOGRAM;  Surgeon: Jolaine Artist, MD;  Location: [facility] CATH LAB;  Service: Cardiovascular;  Laterality: N/A;    ROS- all systems are reviewed and negative except as per HPI above  Current Outpatient Medications  Medication Sig Dispense Refill  . acetaminophen (TYLENOL ARTHRITIS PAIN) 650 MG CR tablet Take 650 mg by mouth every 8 (eight) hours as needed for pain.    Marland Kitchen albuterol (PROAIR HFA) 108 (90 Base) MCG/ACT inhaler Inhale 2 puffs into the lungs every 6 (six) hours as needed for wheezing.    Marland Kitchen amoxicillin-clavulanate (AUGMENTIN) 875-125 MG tablet Take 1 tablet by mouth 2 (two) times daily. For 10 days    . aspirin 81 MG tablet Take 81 mg by mouth daily.      Marland Kitchen azelastine (ASTELIN) 0.1 % nasal spray Place 1 spray into both nostrils daily. Use in each nostril as directed     . budesonide-formoterol (SYMBICORT) 160-4.5 MCG/ACT inhaler Inhale 2 puffs into the lungs 2 (two) times daily.    . citalopram (CELEXA) 20 MG tablet Take 20 mg by mouth daily.    Marland Kitchen doxycycline (VIBRA-TABS) 100 MG tablet Take 100 mg by mouth 2 (two) times daily.    Marland Kitchen ezetimibe (ZETIA) 10 MG tablet TAKE 1/2 TABLET TWICE A WEEK 5 tablet 11  . fexofenadine (ALLEGRA)  180 MG tablet Take 180 mg by mouth daily as needed for allergies.     . fish oil-omega-3 fatty acids 1000 MG capsule Take 2 g by mouth daily.     . fluticasone furoate-vilanterol (BREO ELLIPTA) 100-25 MCG/INH AEPB Inhale 1 puff into the lungs daily as needed for shortness of breath or wheezing.    . furosemide (LASIX) 20 MG tablet TAKE 2 TABLETS BY MOUTH DAILY. MAY TAKE ADDITIONAL 2 TABS AS NEEDED FOR SWELLING/ SHORT OF BREATH 120 tablet 3  . isosorbide mononitrate (IMDUR) 30 MG 24 hr tablet Take 30 mg by mouth daily.    . nitroGLYCERIN (NITROSTAT) 0.4 MG SL tablet Place 1 tablet (0.4 mg  total) under the tongue every 5 (five) minutes as needed for chest pain. 25 tablet 3  . omeprazole (PRILOSEC) 10 MG capsule Take 10 mg by mouth daily.    . pantoprazole (PROTONIX) 40 MG tablet Take 40 mg by mouth daily.    . polyethylene glycol (MIRALAX / GLYCOLAX) packet Take 17 g by mouth at bedtime.    . rosuvastatin (CRESTOR) 5 MG tablet TAKE 1 TABLET BY MOUTH EVERY DAY ON MONDAY, WEDNESDAY,AND FRIDAY 30 tablet 6  . sacubitril-valsartan (ENTRESTO) 49-51 MG Take 1 tablet by mouth 2 (two) times daily. 180 tablet 3  . spironolactone (ALDACTONE) 25 MG tablet Take 1 tablet (25 mg total) by mouth daily. 30 tablet 0   No current facility-administered medications for this visit.     Physical Exam: Vitals:   02/02/18 1214  BP: 124/62  Pulse: 83  SpO2: 99%  Weight: 125 lb (56.7 kg)  Height: 5\' 3"  (1.6 m)    GEN- The patient is well appearing, alert and oriented x 3 today.   Head- normocephalic, atraumatic Eyes-  Sclera clear, conjunctiva pink Ears- hearing intact Oropharynx- clear Lungs- Clear to ausculation bilaterally, normal work of breathing Chest- ICD pocket is well healed Heart- Regular rate and rhythm, no murmurs, rubs or gallops, PMI not laterally displaced GI- soft, NT, ND, + BS Extremities- no clubbing, cyanosis, or edema  ICD interrogation- reviewed in detail today,  See PACEART report  ekg tracing 01/16/18 is personally reviewed and shows sinus rhythm, QRS 119 msec  Assessment and Plan:  1.  Chronic systolic dysfunction/ CAD/ ischemic CM euvolemic today No ischemic symptoms Stable on an appropriate medical regimen Normal ICD function See Pace Art report No changes today Followed in ICM device clinic She appears to have 15-18 months estimated battery longevity at this time.  We discussed this today.  Demonstrated auditory tone, though she could not hear it.  2. Tobacco Cessation advised She is down to 3 cigarettes per day and is trying to quit  [facility] Return  to see EP NP every year  Thompson Grayer MD, [facility] 02/02/2018 12:24 PM

## 2018-02-02 NOTE — Patient Instructions (Addendum)
Medication Instructions:  Your physician recommends that you continue on your current medications as directed. Please refer to the Current Medication list given to you today.   Labwork: None ordered  Testing/Procedures: None ordered  Follow-Up: Remote monitoring is used to monitor your ICD from home. This monitoring reduces the number of office visits required to check your device to one time per year. It allows [place] to keep an eye on the functioning of your device to ensure it is working properly. You are scheduled for a device check from home on 03/07/18. You may send your transmission at any time that day. If you have a wireless device, the transmission will be sent automatically. After your physician reviews your transmission, you will receive a postcard with your next transmission date.   Your physician wants you to follow-up in: 1 year with [name], NP. You will receive a reminder letter in the mail two months in advance. If you don't receive a letter, please call our office to schedule the follow-up appointment.   Any Other Special Instructions Will Be Listed Below (If Applicable).     If you need a refill on your cardiac medications before your next appointment, please call your pharmacy.

## 2018-02-09 ENCOUNTER — Ambulatory Visit (HOSPITAL_COMMUNITY)
Admission: RE | Admit: 2018-02-09 | Discharge: 2018-02-09 | Disposition: A | Discharge status: Home / Self Care | Payer: Medicare Other | Source: Ambulatory Visit | Attending: Internal Medicine | Admitting: Internal Medicine

## 2018-02-09 ENCOUNTER — Encounter (HOSPITAL_COMMUNITY): Payer: Self-pay

## 2018-02-09 VITALS — BP 108/56 | HR 91 | Wt 127.8 lb

## 2018-02-09 DIAGNOSIS — Z7982 Long term (current) use of aspirin: Secondary | ICD-10-CM | POA: Insufficient documentation

## 2018-02-09 DIAGNOSIS — I10 Essential (primary) hypertension: Secondary | ICD-10-CM

## 2018-02-09 DIAGNOSIS — F17210 Nicotine dependence, cigarettes, uncomplicated: Secondary | ICD-10-CM | POA: Diagnosis not present

## 2018-02-09 DIAGNOSIS — I739 Peripheral vascular disease, unspecified: Secondary | ICD-10-CM | POA: Insufficient documentation

## 2018-02-09 DIAGNOSIS — I252 Old myocardial infarction: Secondary | ICD-10-CM | POA: Insufficient documentation

## 2018-02-09 DIAGNOSIS — Z95810 Presence of automatic (implantable) cardiac defibrillator: Secondary | ICD-10-CM | POA: Insufficient documentation

## 2018-02-09 DIAGNOSIS — I5022 Chronic systolic (congestive) heart failure: Secondary | ICD-10-CM

## 2018-02-09 DIAGNOSIS — I259 Chronic ischemic heart disease, unspecified: Secondary | ICD-10-CM | POA: Diagnosis not present

## 2018-02-09 DIAGNOSIS — I6523 Occlusion and stenosis of bilateral carotid arteries: Secondary | ICD-10-CM | POA: Diagnosis not present

## 2018-02-09 DIAGNOSIS — F329 Major depressive disorder, single episode, unspecified: Secondary | ICD-10-CM | POA: Diagnosis not present

## 2018-02-09 DIAGNOSIS — F32A Depression, unspecified: Secondary | ICD-10-CM

## 2018-02-09 DIAGNOSIS — Z79899 Other long term (current) drug therapy: Secondary | ICD-10-CM | POA: Diagnosis not present

## 2018-02-09 DIAGNOSIS — I255 Ischemic cardiomyopathy: Secondary | ICD-10-CM | POA: Diagnosis not present

## 2018-02-09 DIAGNOSIS — K449 Diaphragmatic hernia without obstruction or gangrene: Secondary | ICD-10-CM | POA: Diagnosis not present

## 2018-02-09 DIAGNOSIS — Z888 Allergy status to other drugs, medicaments and biological substances status: Secondary | ICD-10-CM | POA: Diagnosis not present

## 2018-02-09 DIAGNOSIS — R001 Bradycardia, unspecified: Secondary | ICD-10-CM | POA: Insufficient documentation

## 2018-02-09 DIAGNOSIS — Z720 Tobacco use: Secondary | ICD-10-CM

## 2018-02-09 DIAGNOSIS — I2510 Atherosclerotic heart disease of native coronary artery without angina pectoris: Secondary | ICD-10-CM | POA: Diagnosis not present

## 2018-02-09 DIAGNOSIS — E785 Hyperlipidemia, unspecified: Secondary | ICD-10-CM | POA: Diagnosis not present

## 2018-02-09 DIAGNOSIS — J449 Chronic obstructive pulmonary disease, unspecified: Secondary | ICD-10-CM | POA: Insufficient documentation

## 2018-02-09 LAB — BASIC METABOLIC PANEL
ANION GAP: 11 (ref 5–15)
BUN: 12 mg/dL (ref 6–20)
CALCIUM: 9.2 mg/dL (ref 8.9–10.3)
CO2: 22 mmol/L (ref 22–32)
Chloride: 102 mmol/L (ref 101–111)
Creatinine, Ser: 1.02 mg/dL — ABNORMAL HIGH (ref 0.44–1.00)
GFR calc Af Amer: >60 mL/min (ref >60)
GFR, EST NON AFRICAN AMERICAN: 55 mL/min — AB (ref >60)
Glucose, Bld: 116 mg/dL — ABNORMAL HIGH (ref 65–99)
POTASSIUM: 4.2 mmol/L (ref 3.5–5.1)
Sodium: 135 mmol/L (ref 135–145)

## 2018-02-09 LAB — CBC
HEMATOCRIT: 32.9 % — AB (ref 36.0–46.0)
HEMOGLOBIN: 11.1 g/dL — AB (ref 12.0–15.0)
MCH: 32.1 pg (ref 26.0–34.0)
MCHC: 33.7 g/dL (ref 30.0–36.0)
MCV: 95.1 fL (ref 78.0–100.0)
Platelets: 221 10*3/uL (ref 150–400)
RBC: 3.46 MIL/uL — ABNORMAL LOW (ref 3.87–5.11)
RDW: 14.7 % (ref 11.5–15.5)
WBC: 6.9 10*3/uL (ref 4.0–10.5)

## 2018-02-09 NOTE — Progress Notes (Signed)
Advanced Heart Failure Note   Patient ID: [name], female   DOB: [birth year], 67 y.o.   MRN: [id number]  Date:  02/09/2018   ID:  [name], DOB 19-Feb-1951, MRN [id number]  PCP:  [name] in [place] Electrophysiologist:  Dr. Thompson Grayer  General Cardiologist: Dr Johnsie Cancel HF: Dr Haroldine Laws   History of Present Illness: [name] is a 67 y.o. female who was referred to the [facility] for evaluation for advanced therapies.   She has a hx of CAD, s/p prior Ant MI, s/p prior POBA to LAD, Dx, RCA, CHF due to ICM with high scar burden by MRI (12/09), s/p ICD, HTN, HL, depression, tobacco abuse.   ABI 7/14 R 0.93 L 0.95 MRI (12/09):  High scar burden, Inf and Apical AK, EF 24%.  Echo 10/14 EF 15% global HK. RV mild HK  Mod MR. Severe TR. PA 5mmHG   Lexiscan Myoview (11/14): LV Ejection Fraction: 24%. Extensive scar in the inferior wall, apex and anterior wall. Minimal reversibility Carotid Doppler : August 2015 showed 60-79% bilateral ICA stenosis. No previous CVA. Cath 07/26/14 Left main: Calcified 40% mid to distal stenosis LAD: Long vessel wrapping the apex 20-30% plaque in midsection. Distal vessel is small.  LCX: Large OM-1 and large OM-2. 50% lesion in proximal OM-2 RCA: Dominant vessel. Calcified, eccentric 50-60% lesion in midsection . LV-gram done in the RAO projection: Ejection fraction = 25% Severe global HK of mid to distal LV. No MR.    CPX 11/14 FVC 2.08 (67%)  FEV1 1.58 (66%)  FEV1/FVC 76%  Resting HR: 84 Peak HR: 142 (91% age predicted max HR) BP rest: 114/60 BP peak: 148/70 (IPE) Peak VO2: 15.0 (63% predicted peak VO2) VE/VCO2 slope: 32 OUES: 0.85 Peak RER: 1.33 Ventilatory Threshold: 12.1 (50.8% predicted peak VO2) VE/MVV: 60.1% O2pulse: 6 (75% predicted O2pulse)  Admitted 2/8 - 01/17/18 with Acute/bronchitis + Influenza A. Diuresed with IV lasix with A/C CHF. Discharge weight 128 lbs. Finished course of doxycyline as outpatient.    She presents today post hospital follow up. She is feels like she is still getting over her hospitalization, and feeling better every day. No problems with her daily activities. Hasn't needed any extra lasix. Cutting back on cigarettes.   Denies lightheadedness or dizziness. Having bilateral claudication and leg fatigue. Seeing vascular, plan for procedure to "bust up" blockages and possible stent.   Echo 09/17/16 :LVEF 25-30%, Grade 1  Labs (10/12):  K 3.2, creatinine 0.9, Hgb 13.5 Labs (9/14):    K 3.4, creatinine 1.0, pro BNP 1560, Hgb 11.8, HDL 25, LDL 60, ALT 29 Labs (10/14):  K 3.3=> 4.2, creatinine 1.2, BNP 1175 Labs (10/02/13) K 3.2 creatinine 0.9 Labs (12/13/13): LDL 134, AST 24, ALT 24, TC 221, TG 104 Labs (8/15): K 5.1, creatinine 1.1  Labs (11/15): LDL 79, HDL 67 Labs (3/16): K 3.9, creatinine 1.13  Wt Readings from Last 3 Encounters:  02/09/18 127 lb 12.8 oz (58 kg)  02/02/18 125 lb (56.7 kg)  01/17/18 128 lb 3.2 oz (58.2 kg)     Past Medical History:  Diagnosis Date  . Arthritis    hands  . Asthma   . CAD (coronary artery disease)    a. s/p prior Ant MI, b. s/p prior POBA to LAD, Dx, RCA,;  c.  LHC (10/12):  dLAD 40, pCFX 30, OM1 50, pRCA 30;  d.  [facility] Myoview (11/14):  High risk study; inferior, anterior, apical defects; minimal  reversibility toward the apex; consistent with prior infarct and very mild peri-infarct ischemia, EF 24%, inferior/apical/anterior akinesis  . Chronic systolic heart failure ([place])   . Depression   . H/O hiatal hernia   . HLD (hyperlipidemia)   . HTN (hypertension)   . Ischemic cardiomyopathy    MRI (12/09):  High scar burden, Inf and Apical AK, EF 24%.;  Echo (10/14): EF 15%, Gr 2 DD, mild to mod MR, mod LAE, RVSF mildly reduced, mod RAE, severe TR, PASP 49-53 (mod pulmonary HTN)  . MI (myocardial infarction) ([place])   . Tobacco abuse     Current Outpatient Medications  Medication Sig Dispense Refill  . acetaminophen (TYLENOL  ARTHRITIS PAIN) 650 MG CR tablet Take 650 mg by mouth every 8 (eight) hours as needed for pain.    Marland Kitchen albuterol (PROAIR HFA) 108 (90 Base) MCG/ACT inhaler Inhale 2 puffs into the lungs every 6 (six) hours as needed for wheezing.    Marland Kitchen aspirin 81 MG tablet Take 81 mg by mouth daily.      Marland Kitchen azelastine (ASTELIN) 0.1 % nasal spray Place 1 spray into both nostrils daily. Use in each nostril as directed     . budesonide-formoterol (SYMBICORT) 160-4.5 MCG/ACT inhaler Inhale 2 puffs into the lungs 2 (two) times daily.    . citalopram (CELEXA) 20 MG tablet Take 20 mg by mouth daily.    Marland Kitchen doxycycline (VIBRA-TABS) 100 MG tablet Take 100 mg by mouth 2 (two) times daily.    Marland Kitchen ezetimibe (ZETIA) 10 MG tablet TAKE 1/2 TABLET TWICE A WEEK 5 tablet 11  . fexofenadine (ALLEGRA) 180 MG tablet Take 180 mg by mouth daily as needed for allergies.     . fish oil-omega-3 fatty acids 1000 MG capsule Take 2 g by mouth daily.     . fluticasone furoate-vilanterol (BREO ELLIPTA) 100-25 MCG/INH AEPB Inhale 1 puff into the lungs daily as needed for shortness of breath or wheezing.    . furosemide (LASIX) 20 MG tablet TAKE 2 TABLETS BY MOUTH DAILY. MAY TAKE ADDITIONAL 2 TABS AS NEEDED FOR SWELLING/ SHORT OF BREATH 120 tablet 3  . isosorbide mononitrate (IMDUR) 30 MG 24 hr tablet Take 30 mg by mouth daily.    . nitroGLYCERIN (NITROSTAT) 0.4 MG SL tablet Place 1 tablet (0.4 mg total) under the tongue every 5 (five) minutes as needed for chest pain. 25 tablet 3  . omeprazole (PRILOSEC) 10 MG capsule Take 10 mg by mouth daily.    . pantoprazole (PROTONIX) 40 MG tablet Take 40 mg by mouth daily.    . polyethylene glycol (MIRALAX / GLYCOLAX) packet Take 17 g by mouth at bedtime.    . rosuvastatin (CRESTOR) 5 MG tablet TAKE 1 TABLET BY MOUTH EVERY DAY ON MONDAY, WEDNESDAY,AND FRIDAY 30 tablet 6  . sacubitril-valsartan (ENTRESTO) 49-51 MG Take 1 tablet by mouth 2 (two) times daily. 180 tablet 3  . spironolactone (ALDACTONE) 25 MG tablet  Take 1 tablet (25 mg total) by mouth daily. 30 tablet 0   No current facility-administered medications for this encounter.    Allergies:    Allergies  Allergen Reactions  . Prednisone Shortness Of Breath, Swelling and Rash    Sore throat  . Zebeta [Bisoprolol Fumarate]     Pt states she couldn't think and it messed her head up  . Statins Other (See Comments)    Gradually tired with daily Lipitor, made pt feel bad (Pt can take Crestor 5 mg 4 times per week  and Zetia 5 mg every other day) Other reaction(s): Other (See Comments) un   Social History:  The patient  reports that she has been smoking cigarettes.  she has never used smokeless tobacco. She reports that she does not drink alcohol or use drugs.   Review of systems complete and found to be negative unless listed in HPI.    Vitals:   02/09/18 1139  BP: (!) 108/56  Pulse: 91  SpO2: 97%  Weight: 127 lb 12.8 oz (58 kg)   Wt Readings from Last 3 Encounters:  02/09/18 127 lb 12.8 oz (58 kg)  02/02/18 125 lb (56.7 kg)  01/17/18 128 lb 3.2 oz (58.2 kg)    PHYSICAL EXAM:  General: Well appearing. No resp difficulty. HEENT: Normal Neck: Supple. JVP 5-6. Carotids 2+ bilat; no bruits. No thyromegaly or nodule noted. Cor: PMI nondisplaced. RRR, No M/G/R noted Lungs: CTAB, normal effort. Abdomen: Soft, non-tender, non-distended, no HSM. No bruits or masses. +BS  Extremities: No cyanosis, clubbing, or rash. R and LLE no edema.  Neuro: Alert & orientedx3, cranial nerves grossly intact. moves all 4 extremities w/o difficulty. Affect pleasant   ASSESSMENT AND PLAN:  1. CAD s/p previous anterior MI:  Cath 8/15 nonobstructive CAD.   -  Occasional CP in high-stress situations. Stable pattern - Continue ASA and statin.  - Followed by Dr. Johnsie Cancel  2.  Chronic Systolic CHF  due to [facility] with mild RV dysfunction s/p [employer] ICD.  - Echo 01/15/18 LVEF 20-25%.  - NYHA class III symptoms - Volume status stable by exam and optivol.  -  Continue lasix 40 mg daily. Can take extra as needed. - Continue entresto 49/51 mg BID. Intolerant to up-titration with hypotension.  - Continue spironolactone 25 mg daily.  Check BMET today - Does not tolerate beta blockers due to symptomatic brady in past. - Reinforced fluid restriction to < 2 L daily, sodium restriction to less than 2000 mg daily, and the importance of daily weights.   - Consider CPX once recovered from her respiratory issues, though candidacy for advanced therapies likely limited from ventilatory standpoint.  3. COPD with ongoing tobacco abuse:  - With recent exacerbation in setting of Flu A and bronchitis.  Resolved.  - Needs to stop smoking.  4. PAD with Intermittent claudication: Medical therapy.  - Moderate bilateral calf claudication worse on the left side due to known SFA disease. This has progressed over past few months   - Saw Dr. Fletcher Anon recently. Continue medical f/u.  - Need to stop smoking. No change. 5. Bilateral carotid stenosis - Followed by Dr. Johnsie Cancel. No change.  6. H/o symptomatic bradycardia:  - Stable of BB. Do not plan to re-challenge.  7. Hyperlipidemia with intolerance of multiple cholesterol meds: - Tolerating Crestor 5 mg three times/week and Zetia 5mg  two times weekly. No change.  - Per PCP.  8. Depression/Anxiety:  - Stable.   She is doing well post recent hospitalization. Labs today. No room to up-titrate meds. RTC 2 months.   Shirley Friar, PA-C  02/09/18   Greater than 50% of the 25 minute visit was spent in counseling/coordination of care regarding disease state education, salt/fluid restriction, sliding scale diuretics, and medication compliance.

## 2018-02-09 NOTE — Patient Instructions (Signed)
Routine lab work today. Will notify you of abnormal results, otherwise no news is good news!  Follow up with Oda Kilts PA-C in 2 months.  ________________________________________________________ [name] Code: 2824  Take all medication as prescribed the day of your appointment. Bring all medications with you to your appointment.  Do the following things EVERYDAY: 1) Weigh yourself in the morning before breakfast. Write it down and keep it in a log. 2) Take your medicines as prescribed 3) Eat low salt foods-Limit salt (sodium) to 2000 mg per day.  4) Stay as active as you can everyday 5) Limit all fluids for the day to less than 2 liters

## 2018-02-22 ENCOUNTER — Encounter: Payer: Self-pay | Admitting: Cardiovascular Disease

## 2018-02-22 ENCOUNTER — Ambulatory Visit (INDEPENDENT_AMBULATORY_CARE_PROVIDER_SITE_OTHER): Payer: Medicare Other | Admitting: Cardiovascular Disease

## 2018-02-22 VITALS — BP 118/62 | HR 86 | Ht 63.0 in | Wt 127.6 lb

## 2018-02-22 DIAGNOSIS — Z720 Tobacco use: Secondary | ICD-10-CM | POA: Diagnosis not present

## 2018-02-22 DIAGNOSIS — I739 Peripheral vascular disease, unspecified: Secondary | ICD-10-CM | POA: Diagnosis not present

## 2018-02-22 DIAGNOSIS — I5022 Chronic systolic (congestive) heart failure: Secondary | ICD-10-CM | POA: Diagnosis not present

## 2018-02-22 DIAGNOSIS — I2510 Atherosclerotic heart disease of native coronary artery without angina pectoris: Secondary | ICD-10-CM | POA: Diagnosis not present

## 2018-02-22 DIAGNOSIS — I259 Chronic ischemic heart disease, unspecified: Secondary | ICD-10-CM | POA: Diagnosis not present

## 2018-02-22 DIAGNOSIS — Z01818 Encounter for other preprocedural examination: Secondary | ICD-10-CM

## 2018-02-22 LAB — BASIC METABOLIC PANEL
BUN/Creatinine Ratio: 14 (ref 12–28)
BUN: 13 mg/dL (ref 8–27)
CALCIUM: 9.3 mg/dL (ref 8.7–10.3)
CO2: 23 mmol/L (ref 20–29)
CREATININE: 0.92 mg/dL (ref 0.57–1.00)
Chloride: 102 mmol/L (ref 96–106)
GFR calc Af Amer: 73 mL/min/{1.73_m2} (ref >59)
GFR, EST NON AFRICAN AMERICAN: 64 mL/min/{1.73_m2} (ref >59)
GLUCOSE: 86 mg/dL (ref 65–99)
Potassium: 4.6 mmol/L (ref 3.5–5.2)
Sodium: 139 mmol/L (ref 134–144)

## 2018-02-22 LAB — CBC
HEMOGLOBIN: 11 g/dL — AB (ref 11.1–15.9)
Hematocrit: 31.2 % — ABNORMAL LOW (ref 34.0–46.6)
MCH: 33.1 pg — AB (ref 26.6–33.0)
MCHC: 35.3 g/dL (ref 31.5–35.7)
MCV: 94 fL (ref 79–97)
Platelets: 180 10*3/uL (ref 150–379)
RBC: 3.32 x10E6/uL — AB (ref 3.77–5.28)
RDW: 14.5 % (ref 12.3–15.4)
WBC: 6.6 10*3/uL (ref 3.4–10.8)

## 2018-02-22 LAB — PROTIME-INR
INR: 0.9 (ref 0.8–1.2)
Prothrombin Time: 9.8 s (ref 9.1–12.0)

## 2018-02-22 NOTE — Progress Notes (Signed)
Cardiology Office Note   Date:  02/22/2018   ID:  MINETTE MANDERS, DOB [birth year], MRN [id number]  PCP:  [facility]  Cardiologist:  Dr. Eber Jones  No chief complaint on file.     History of Present Illness: [name] is a 67 y.o. female who is here today for a follow up visit regarding peripheral arterial disease. She has a hx of CAD, s/p prior Ant MI, s/p prior POBA to LAD, CHF due to ICM with high scar burden by MRI (12/09), s/p ICD, HTN, HL, depression, carotid disease and tobacco abuse.   most recent vascular studies showed an ABI of 0.96 on the right and 0.69 on the left.  She was hospitalized in February for the flu.  Since hospital discharge, she noted significant worsening of left calf claudication with minimal walking.  This has significantly affected her activities of daily living.  She continues to smoke 6 cigarettes a day.  No significant symptoms on the right side.  She reports stable mild exertional chest pain.  She is now more limited by her left calf pain than chest pain.  Past Medical History:  Diagnosis Date  . Arthritis    hands  . Asthma   . CAD (coronary artery disease)    a. s/p prior Ant MI, b. s/p prior POBA to LAD, Dx, RCA,;  c.  LHC (10/12):  dLAD 40, pCFX 30, OM1 50, pRCA 30;  d.  [facility] Adam Myoview (11/14):  High risk study; inferior, anterior, apical defects; minimal reversibility toward the apex; consistent with prior infarct and very mild peri-infarct ischemia, EF 24%, inferior/apical/anterior akinesis  . Chronic systolic heart failure ([place])   . Depression   . H/O hiatal hernia   . HLD (hyperlipidemia)   . HTN (hypertension)   . Ischemic cardiomyopathy    MRI (12/09):  High scar burden, Inf and Apical AK, EF 24%.;  Echo (10/14): EF 15%, Gr 2 DD, mild to mod MR, mod LAE, RVSF mildly reduced, mod RAE, severe TR, PASP 49-53 (mod pulmonary HTN)  . MI (myocardial infarction) ([place])   . Tobacco abuse     Past  Surgical History:  Procedure Laterality Date  . CARDIAC DEFIBRILLATOR PLACEMENT  01/18/2009   MDT single chamber ICD implanted by Dr Rayann Heman   . cardiac stents    . COLONOSCOPY  03/18/2012   Procedure: COLONOSCOPY;  Surgeon: Beryle Beams, MD;  Location: [facility] ENDOSCOPY;  Service: Endoscopy;  Laterality: N/A;  . CORONARY ANGIOPLASTY    . ESOPHAGOGASTRODUODENOSCOPY N/A 03/10/2013   Procedure: ESOPHAGOGASTRODUODENOSCOPY (EGD);  Surgeon: Beryle Beams, MD;  Location: [facility];  Service: Endoscopy;  Laterality: N/A;  . LEFT HEART CATHETERIZATION WITH CORONARY ANGIOGRAM N/A 07/26/2014   Procedure: LEFT HEART CATHETERIZATION WITH CORONARY ANGIOGRAM;  Surgeon: Jolaine Artist, MD;  Location: [facility] CATH LAB;  Service: Cardiovascular;  Laterality: N/A;     Current Outpatient Medications  Medication Sig Dispense Refill  . acetaminophen (TYLENOL) 500 MG tablet Take 500 mg by mouth as needed (for pain).    Marland Kitchen albuterol (PROAIR HFA) 108 (90 Base) MCG/ACT inhaler Inhale 2 puffs into the lungs every 6 (six) hours as needed for wheezing.    Marland Kitchen aspirin 81 MG tablet Take 81 mg by mouth daily.      Marland Kitchen azelastine (ASTELIN) 0.1 % nasal spray Place 1 spray into both nostrils daily. Use in each nostril as directed     . budesonide-formoterol (SYMBICORT) 160-4.5 MCG/ACT inhaler  Inhale 2 puffs into the lungs 2 (two) times daily.    . citalopram (CELEXA) 20 MG tablet Take 20 mg by mouth daily.    Marland Kitchen ezetimibe (ZETIA) 10 MG tablet TAKE 1/2 TABLET TWICE A WEEK 5 tablet 11  . fexofenadine (ALLEGRA) 180 MG tablet Take 180 mg by mouth daily as needed for allergies.     . fish oil-omega-3 fatty acids 1000 MG capsule Take 2 g by mouth daily.     . furosemide (LASIX) 20 MG tablet TAKE 2 TABLETS BY MOUTH DAILY. MAY TAKE ADDITIONAL 2 TABS AS NEEDED FOR SWELLING/ SHORT OF BREATH 120 tablet 3  . isosorbide mononitrate (IMDUR) 30 MG 24 hr tablet Take 30 mg by mouth daily.    . nitroGLYCERIN (NITROSTAT) 0.4 MG SL tablet Place 1  tablet (0.4 mg total) under the tongue every 5 (five) minutes as needed for chest pain. 25 tablet 3  . omeprazole (PRILOSEC) 40 MG capsule Take 40 mg by mouth daily.     . polyethylene glycol (MIRALAX / GLYCOLAX) packet Take 17 g by mouth at bedtime.    . rosuvastatin (CRESTOR) 5 MG tablet TAKE 1 TABLET BY MOUTH EVERY DAY ON MONDAY, WEDNESDAY,AND FRIDAY 30 tablet 6  . sacubitril-valsartan (ENTRESTO) 49-51 MG Take 1 tablet by mouth 2 (two) times daily. 180 tablet 3  . spironolactone (ALDACTONE) 25 MG tablet Take 1 tablet (25 mg total) by mouth daily. 30 tablet 0  . acetaminophen (TYLENOL ARTHRITIS PAIN) 650 MG CR tablet Take 650 mg by mouth every 8 (eight) hours as needed for pain.    . clonazePAM (KLONOPIN) 0.5 MG tablet Take 0.25-0.5 mg by mouth 2 (two) times daily as needed for anxiety.     No current facility-administered medications for this visit.     Allergies:   Prednisone; Zebeta [bisoprolol fumarate]; and Statins    Social History:  The patient  reports that she has been smoking cigarettes.  she has never used smokeless tobacco. She reports that she does not drink alcohol or use drugs.   Family History:  The patient's family history is not on file.    ROS:  Please see the history of present illness.   Otherwise, review of systems are positive for none.   All other systems are reviewed and negative.    PHYSICAL EXAM: VS:  BP 118/62   Pulse 86   Ht 5\' 3"  (1.6 m)   Wt 127 lb 9.6 oz (57.9 kg)   BMI 22.60 kg/m  , BMI Body mass index is 22.6 kg/m. GEN: Well nourished, well developed, in no acute distress  HEENT: normal  Neck: no JVD, carotid bruits, or masses Cardiac: RRR; no murmurs, rubs, or gallops,no edema  Respiratory:  clear to auscultation bilaterally, normal work of breathing GI: soft, nontender, nondistended, + BS MS: no deformity or atrophy  Skin: warm and dry, no rash Neuro:  Strength and sensation are intact Psych: euthymic mood, full affect Vascular: Femoral  pulses normal bilaterally. Distal pulses are not palpable.  EKG:  EKG is not ordered today.    Recent Labs: 01/14/2018: B Natriuretic Peptide 2,051.6 01/17/2018: Magnesium 2.0 02/22/2018: BUN 13; Creatinine, Ser 0.92; Hemoglobin 11.0; Platelets 180; Potassium 4.6; Sodium 139    Lipid Panel    Component Value Date/Time   CHOL 165 08/04/2016 1033   TRIG 117 08/04/2016 1033   HDL 65 08/04/2016 1033   CHOLHDL 2.5 08/04/2016 1033   VLDL 23 08/04/2016 1033   LDLCALC 77 08/04/2016 1033  LDLDIRECT 134.7 12/13/2013 1043      Wt Readings from Last 3 Encounters:  02/22/18 127 lb 9.6 oz (57.9 kg)  02/09/18 127 lb 12.8 oz (58 kg)  02/02/18 125 lb (56.7 kg)       PAD Screen 08/24/2017  Previous PAD dx? Yes  Previous surgical procedure? No  Pain with walking? Yes  Subsides with rest? Yes  Feet/toe relief with dangling? No  Painful, non-healing ulcers? No  Extremities discolored? No      ASSESSMENT AND PLAN:  1.  Peripheral arterial disease: She now has severe left calf claudication which is significantly affecting activities of daily living.  Due to that, I recommend proceeding with abdominal aortogram with left lower extremity runoff and possible endovascular intervention.  I discussed the procedure in details as well as risks and benefits.  She does have mild to moderate chronic kidney disease and will bring her few hours earlier for hydration.  Hold furosemide the morning of the test.    2. Coronary artery disease involving native coronary arteries with stable angina: No significant worsening of symptoms.  Continue medical therapy.  3. Chronic systolic heart failure: She appears to be euvolemic  4. Tobacco use: I again discussed with her the importance of smoking cessation.  5. Hyperlipidemia: Continue treatment with Zetia and rosuvastatin.    Disposition:   FU with me in 1 months  Signed,  Kathlyn Sacramento, MD  02/22/2018 5:56 PM    [place]

## 2018-02-22 NOTE — H&P (View-Only) (Signed)
Cardiology Office Note   Date:  02/22/2018   ID:  [name], DOB 11-26-1951, MRN [id number]  PCP:  [facility]  Cardiologist:  Dr. Eber Jones  No chief complaint on file.     History of Present Illness: [name] is a 67 y.o. female who is here today for a follow up visit regarding peripheral arterial disease. She has a hx of CAD, s/p prior Ant MI, s/p prior POBA to LAD, CHF due to ICM with high scar burden by MRI (12/09), s/p ICD, HTN, HL, depression, carotid disease and tobacco abuse.   most recent vascular studies showed an ABI of 0.96 on the right and 0.69 on the left.  She was hospitalized in February for the flu.  Since hospital discharge, she noted significant worsening of left calf claudication with minimal walking.  This has significantly affected her activities of daily living.  She continues to smoke 6 cigarettes a day.  No significant symptoms on the right side.  She reports stable mild exertional chest pain.  She is now more limited by her left calf pain than chest pain.  Past Medical History:  Diagnosis Date  . Arthritis    hands  . Asthma   . CAD (coronary artery disease)    a. s/p prior Ant MI, b. s/p prior POBA to LAD, Dx, RCA,;  c.  LHC (10/12):  dLAD 40, pCFX 30, OM1 50, pRCA 30;  d.  [facility] Myoview (11/14):  High risk study; inferior, anterior, apical defects; minimal reversibility toward the apex; consistent with prior infarct and very mild peri-infarct ischemia, EF 24%, inferior/apical/anterior akinesis  . Chronic systolic heart failure ([place])   . Depression   . H/O hiatal hernia   . HLD (hyperlipidemia)   . HTN (hypertension)   . Ischemic cardiomyopathy    MRI (12/09):  High scar burden, Inf and Apical AK, EF 24%.;  Echo (10/14): EF 15%, Gr 2 DD, mild to mod MR, mod LAE, RVSF mildly reduced, mod RAE, severe TR, PASP 49-53 (mod pulmonary HTN)  . MI (myocardial infarction) ([place])   . Tobacco abuse     Past  Surgical History:  Procedure Laterality Date  . CARDIAC DEFIBRILLATOR PLACEMENT  01/18/2009   MDT single chamber ICD implanted by Dr Rayann Heman   . cardiac stents    . COLONOSCOPY  03/18/2012   Procedure: COLONOSCOPY;  Surgeon: Beryle Beams, MD;  Location: [facility] ENDOSCOPY;  Service: Endoscopy;  Laterality: N/A;  . CORONARY ANGIOPLASTY    . ESOPHAGOGASTRODUODENOSCOPY N/A 03/10/2013   Procedure: ESOPHAGOGASTRODUODENOSCOPY (EGD);  Surgeon: Beryle Beams, MD;  Location: [facility];  Service: Endoscopy;  Laterality: N/A;  . LEFT HEART CATHETERIZATION WITH CORONARY ANGIOGRAM N/A 07/26/2014   Procedure: LEFT HEART CATHETERIZATION WITH CORONARY ANGIOGRAM;  Surgeon: Jolaine Artist, MD;  Location: [facility] CATH LAB;  Service: Cardiovascular;  Laterality: N/A;     Current Outpatient Medications  Medication Sig Dispense Refill  . acetaminophen (TYLENOL) 500 MG tablet Take 500 mg by mouth as needed (for pain).    Marland Kitchen albuterol (PROAIR HFA) 108 (90 Base) MCG/ACT inhaler Inhale 2 puffs into the lungs every 6 (six) hours as needed for wheezing.    Marland Kitchen aspirin 81 MG tablet Take 81 mg by mouth daily.      Marland Kitchen azelastine (ASTELIN) 0.1 % nasal spray Place 1 spray into both nostrils daily. Use in each nostril as directed     . budesonide-formoterol (SYMBICORT) 160-4.5 MCG/ACT inhaler  Inhale 2 puffs into the lungs 2 (two) times daily.    . citalopram (CELEXA) 20 MG tablet Take 20 mg by mouth daily.    Marland Kitchen ezetimibe (ZETIA) 10 MG tablet TAKE 1/2 TABLET TWICE A WEEK 5 tablet 11  . fexofenadine (ALLEGRA) 180 MG tablet Take 180 mg by mouth daily as needed for allergies.     . fish oil-omega-3 fatty acids 1000 MG capsule Take 2 g by mouth daily.     . furosemide (LASIX) 20 MG tablet TAKE 2 TABLETS BY MOUTH DAILY. MAY TAKE ADDITIONAL 2 TABS AS NEEDED FOR SWELLING/ SHORT OF BREATH 120 tablet 3  . isosorbide mononitrate (IMDUR) 30 MG 24 hr tablet Take 30 mg by mouth daily.    . nitroGLYCERIN (NITROSTAT) 0.4 MG SL tablet Place 1  tablet (0.4 mg total) under the tongue every 5 (five) minutes as needed for chest pain. 25 tablet 3  . omeprazole (PRILOSEC) 40 MG capsule Take 40 mg by mouth daily.     . polyethylene glycol (MIRALAX / GLYCOLAX) packet Take 17 g by mouth at bedtime.    . rosuvastatin (CRESTOR) 5 MG tablet TAKE 1 TABLET BY MOUTH EVERY DAY ON MONDAY, WEDNESDAY,AND FRIDAY 30 tablet 6  . sacubitril-valsartan (ENTRESTO) 49-51 MG Take 1 tablet by mouth 2 (two) times daily. 180 tablet 3  . spironolactone (ALDACTONE) 25 MG tablet Take 1 tablet (25 mg total) by mouth daily. 30 tablet 0  . acetaminophen (TYLENOL ARTHRITIS PAIN) 650 MG CR tablet Take 650 mg by mouth every 8 (eight) hours as needed for pain.    . clonazePAM (KLONOPIN) 0.5 MG tablet Take 0.25-0.5 mg by mouth 2 (two) times daily as needed for anxiety.     No current facility-administered medications for this visit.     Allergies:   Prednisone; Zebeta [bisoprolol fumarate]; and Statins    Social History:  The patient  reports that she has been smoking cigarettes.  she has never used smokeless tobacco. She reports that she does not drink alcohol or use drugs.   Family History:  The patient's family history is not on file.    ROS:  Please see the history of present illness.   Otherwise, review of systems are positive for none.   All other systems are reviewed and negative.    PHYSICAL EXAM: VS:  BP 118/62   Pulse 86   Ht 5\' 3"  (1.6 m)   Wt 127 lb 9.6 oz (57.9 kg)   BMI 22.60 kg/m  , BMI Body mass index is 22.6 kg/m. GEN: Well nourished, well developed, in no acute distress  HEENT: normal  Neck: no JVD, carotid bruits, or masses Cardiac: RRR; no murmurs, rubs, or gallops,no edema  Respiratory:  clear to auscultation bilaterally, normal work of breathing GI: soft, nontender, nondistended, + BS MS: no deformity or atrophy  Skin: warm and dry, no rash Neuro:  Strength and sensation are intact Psych: euthymic mood, full affect Vascular: Femoral  pulses normal bilaterally. Distal pulses are not palpable.  EKG:  EKG is not ordered today.    Recent Labs: 01/14/2018: B Natriuretic Peptide 2,051.6 01/17/2018: Magnesium 2.0 02/22/2018: BUN 13; Creatinine, Ser 0.92; Hemoglobin 11.0; Platelets 180; Potassium 4.6; Sodium 139    Lipid Panel    Component Value Date/Time   CHOL 165 08/04/2016 1033   TRIG 117 08/04/2016 1033   HDL 65 08/04/2016 1033   CHOLHDL 2.5 08/04/2016 1033   VLDL 23 08/04/2016 1033   LDLCALC 77 08/04/2016 1033  LDLDIRECT 134.7 12/13/2013 1043      Wt Readings from Last 3 Encounters:  02/22/18 127 lb 9.6 oz (57.9 kg)  02/09/18 127 lb 12.8 oz (58 kg)  02/02/18 125 lb (56.7 kg)       PAD Screen 08/24/2017  Previous PAD dx? Yes  Previous surgical procedure? No  Pain with walking? Yes  Subsides with rest? Yes  Feet/toe relief with dangling? No  Painful, non-healing ulcers? No  Extremities discolored? No      ASSESSMENT AND PLAN:  1.  Peripheral arterial disease: She now has severe left calf claudication which is significantly affecting activities of daily living.  Due to that, I recommend proceeding with abdominal aortogram with left lower extremity runoff and possible endovascular intervention.  I discussed the procedure in details as well as risks and benefits.  She does have mild to moderate chronic kidney disease and will bring her few hours earlier for hydration.  Hold furosemide the morning of the test.    2. Coronary artery disease involving native coronary arteries with stable angina: No significant worsening of symptoms.  Continue medical therapy.  3. Chronic systolic heart failure: She appears to be euvolemic  4. Tobacco use: I again discussed with her the importance of smoking cessation.  5. Hyperlipidemia: Continue treatment with Zetia and rosuvastatin.    Disposition:   FU with me in 1 months  Signed,  Kathlyn Sacramento, MD  02/22/2018 5:56 PM    [place]

## 2018-02-22 NOTE — Patient Instructions (Addendum)
   [place] [address] [place] [address] Dept: [phone] Loc: [place]  02/22/2018  You are scheduled for a Peripheral Angiogram on Wednesday, March 27th with Dr. Kathlyn Sacramento.  1. Please arrive at the [facility] (Main Entrance A) at [facility]: [place], [place] [address] at 7:00 AM (three and a half hours before your procedure to ensure your preparation and for pre hydration). Free valet parking service is available.   Special note: Every effort is made to have your procedure done on time. Please understand that emergencies sometimes delay scheduled procedures.  2. Diet: Do not eat or drink anything after midnight prior to your procedure except sips of water to take medications.  3. Labs: Please have your labs drawn today, 3/19, at the [facility] office.   4. Medication instructions in preparation for your procedure:  Please hold your Furosemide the morning of the procedure.  On the morning of your procedure, take your Aspirin and any morning medicines NOT listed above.  You may use sips of water.  5. Plan for one night stay--bring personal belongings. 6. Bring a current list of your medications and current insurance cards. 7. You MUST have a responsible person to drive you home. 8. Someone MUST be with you the first 24 hours after you arrive home or your discharge will be delayed. 9. Please wear clothes that are easy to get on and off and wear slip-on shoes.  Thank you for allowing [place] to care for you!   -- [facility]

## 2018-03-02 ENCOUNTER — Ambulatory Visit (HOSPITAL_COMMUNITY)
Admission: RE | Disposition: A | Discharge status: Home / Self Care | Payer: Self-pay | Source: Ambulatory Visit | Attending: Cardiovascular Disease

## 2018-03-02 ENCOUNTER — Ambulatory Visit (HOSPITAL_COMMUNITY)
Admission: RE | Admit: 2018-03-02 | Discharge: 2018-03-02 | Disposition: A | Discharge status: Home / Self Care | Payer: Medicare Other | Source: Ambulatory Visit | Attending: Cardiovascular Disease | Admitting: Cardiovascular Disease

## 2018-03-02 DIAGNOSIS — I2510 Atherosclerotic heart disease of native coronary artery without angina pectoris: Secondary | ICD-10-CM | POA: Diagnosis not present

## 2018-03-02 DIAGNOSIS — F17210 Nicotine dependence, cigarettes, uncomplicated: Secondary | ICD-10-CM | POA: Insufficient documentation

## 2018-03-02 DIAGNOSIS — I130 Hypertensive heart and chronic kidney disease with heart failure and stage 1 through stage 4 chronic kidney disease, or unspecified chronic kidney disease: Secondary | ICD-10-CM | POA: Insufficient documentation

## 2018-03-02 DIAGNOSIS — F329 Major depressive disorder, single episode, unspecified: Secondary | ICD-10-CM | POA: Insufficient documentation

## 2018-03-02 DIAGNOSIS — Z95810 Presence of automatic (implantable) cardiac defibrillator: Secondary | ICD-10-CM | POA: Insufficient documentation

## 2018-03-02 DIAGNOSIS — I70212 Atherosclerosis of native arteries of extremities with intermittent claudication, left leg: Secondary | ICD-10-CM | POA: Insufficient documentation

## 2018-03-02 DIAGNOSIS — N189 Chronic kidney disease, unspecified: Secondary | ICD-10-CM | POA: Insufficient documentation

## 2018-03-02 DIAGNOSIS — I272 Pulmonary hypertension, unspecified: Secondary | ICD-10-CM | POA: Insufficient documentation

## 2018-03-02 DIAGNOSIS — I739 Peripheral vascular disease, unspecified: Secondary | ICD-10-CM

## 2018-03-02 DIAGNOSIS — J45909 Unspecified asthma, uncomplicated: Secondary | ICD-10-CM | POA: Diagnosis not present

## 2018-03-02 DIAGNOSIS — I708 Atherosclerosis of other arteries: Secondary | ICD-10-CM | POA: Diagnosis not present

## 2018-03-02 DIAGNOSIS — I255 Ischemic cardiomyopathy: Secondary | ICD-10-CM | POA: Insufficient documentation

## 2018-03-02 DIAGNOSIS — I5022 Chronic systolic (congestive) heart failure: Secondary | ICD-10-CM | POA: Diagnosis not present

## 2018-03-02 DIAGNOSIS — I252 Old myocardial infarction: Secondary | ICD-10-CM | POA: Diagnosis not present

## 2018-03-02 DIAGNOSIS — Z79899 Other long term (current) drug therapy: Secondary | ICD-10-CM | POA: Diagnosis not present

## 2018-03-02 DIAGNOSIS — I701 Atherosclerosis of renal artery: Secondary | ICD-10-CM | POA: Diagnosis not present

## 2018-03-02 DIAGNOSIS — Z7982 Long term (current) use of aspirin: Secondary | ICD-10-CM | POA: Diagnosis not present

## 2018-03-02 DIAGNOSIS — Z9861 Coronary angioplasty status: Secondary | ICD-10-CM | POA: Diagnosis not present

## 2018-03-02 DIAGNOSIS — Z888 Allergy status to other drugs, medicaments and biological substances status: Secondary | ICD-10-CM | POA: Insufficient documentation

## 2018-03-02 DIAGNOSIS — I70201 Unspecified atherosclerosis of native arteries of extremities, right leg: Secondary | ICD-10-CM | POA: Insufficient documentation

## 2018-03-02 DIAGNOSIS — E785 Hyperlipidemia, unspecified: Secondary | ICD-10-CM | POA: Insufficient documentation

## 2018-03-02 DIAGNOSIS — M1990 Unspecified osteoarthritis, unspecified site: Secondary | ICD-10-CM | POA: Insufficient documentation

## 2018-03-02 DIAGNOSIS — I70213 Atherosclerosis of native arteries of extremities with intermittent claudication, bilateral legs: Secondary | ICD-10-CM

## 2018-03-02 DIAGNOSIS — Z7951 Long term (current) use of inhaled steroids: Secondary | ICD-10-CM | POA: Insufficient documentation

## 2018-03-02 HISTORY — PX: ABDOMINAL AORTOGRAM W/LOWER EXTREMITY: CATH118223

## 2018-03-02 SURGERY — ABDOMINAL AORTOGRAM W/LOWER EXTREMITY
Anesthesia: LOCAL

## 2018-03-02 MED ORDER — ACETAMINOPHEN 325 MG PO TABS: 650.0000 mg | ORAL_TABLET | ORAL | Status: DC | PRN

## 2018-03-02 MED ORDER — SODIUM CHLORIDE 0.9 % IV SOLN: 250.0000 mL | INTRAVENOUS | Status: DC | PRN

## 2018-03-02 MED ORDER — LIDOCAINE HCL (PF) 1 % IJ SOLN
INTRAMUSCULAR | Status: DC | PRN
  Administered 2018-03-02: 20 mL

## 2018-03-02 MED ORDER — FENTANYL CITRATE (PF) 100 MCG/2ML IJ SOLN
INTRAMUSCULAR | Status: DC | PRN
  Administered 2018-03-02: 25 ug via INTRAVENOUS

## 2018-03-02 MED ORDER — SODIUM CHLORIDE 0.9 % IV SOLN
INTRAVENOUS | Status: DC | PRN
  Administered 2018-03-02: 250 mL via INTRAVENOUS

## 2018-03-02 MED ORDER — SODIUM CHLORIDE 0.9 % IV SOLN
INTRAVENOUS | Status: DC
  Administered 2018-03-02: 08:00:00 via INTRAVENOUS

## 2018-03-02 MED ORDER — MIDAZOLAM HCL 2 MG/2ML IJ SOLN
INTRAMUSCULAR | Status: AC
  Filled 2018-03-02: qty 2

## 2018-03-02 MED ORDER — HEPARIN SODIUM (PORCINE) 1000 UNIT/ML IJ SOLN
INTRAMUSCULAR | Status: AC
  Filled 2018-03-02: qty 1

## 2018-03-02 MED ORDER — IODIXANOL 320 MG/ML IV SOLN
INTRAVENOUS | Status: DC | PRN
  Administered 2018-03-02: 55 mL via INTRA_ARTERIAL

## 2018-03-02 MED ORDER — MIDAZOLAM HCL 2 MG/2ML IJ SOLN
INTRAMUSCULAR | Status: DC | PRN
  Administered 2018-03-02: 1 mg via INTRAVENOUS

## 2018-03-02 MED ORDER — SODIUM CHLORIDE 0.9 % IV SOLN: INTRAVENOUS | Status: DC

## 2018-03-02 MED ORDER — LIDOCAINE HCL 1 % IJ SOLN
INTRAMUSCULAR | Status: AC
  Filled 2018-03-02: qty 20

## 2018-03-02 MED ORDER — SODIUM CHLORIDE 0.9% FLUSH: 3.0000 mL | INTRAVENOUS | Status: DC | PRN

## 2018-03-02 MED ORDER — HEPARIN (PORCINE) IN NACL 2-0.9 UNIT/ML-% IJ SOLN
INTRAMUSCULAR | Status: AC
  Filled 2018-03-02: qty 1000

## 2018-03-02 MED ORDER — ASPIRIN 81 MG PO CHEW: 81.0000 mg | CHEWABLE_TABLET | ORAL | Status: DC

## 2018-03-02 MED ORDER — HEPARIN (PORCINE) IN NACL 2-0.9 UNIT/ML-% IJ SOLN
INTRAMUSCULAR | Status: DC | PRN
  Administered 2018-03-02 (×2): 500 mL via INTRA_ARTERIAL

## 2018-03-02 MED ORDER — SODIUM CHLORIDE 0.9% FLUSH: 3.0000 mL | Freq: Two times a day (BID) | INTRAVENOUS | Status: DC

## 2018-03-02 MED ORDER — FENTANYL CITRATE (PF) 100 MCG/2ML IJ SOLN
INTRAMUSCULAR | Status: AC
  Filled 2018-03-02: qty 2

## 2018-03-02 MED ORDER — ONDANSETRON HCL 4 MG/2ML IJ SOLN: 4.0000 mg | Freq: Four times a day (QID) | INTRAMUSCULAR | Status: DC | PRN

## 2018-03-02 SURGICAL SUPPLY — 15 items
CATH ANGIO 5F PIGTAIL 65CM (CATHETERS) ×1 IMPLANT
CATH CROSS OVER TEMPO 5F (CATHETERS) ×1 IMPLANT
CATH STRAIGHT 5FR 65CM (CATHETERS) ×1 IMPLANT
COVER PRB 48X5XTLSCP FOLD TPE (BAG) IMPLANT
COVER PROBE 5X48 (BAG) ×2
DEVICE CLOSURE MYNXGRIP 5F (Vascular Products) ×1 IMPLANT
KIT MICROPUNCTURE NIT STIFF (SHEATH) ×1 IMPLANT
KIT PV (KITS) ×2 IMPLANT
SHEATH AVANTI 11CM 5FR (SHEATH) ×1 IMPLANT
STOPCOCK MORSE 400PSI 3WAY (MISCELLANEOUS) ×1 IMPLANT
SYRINGE MEDRAD AVANTA MACH 7 (SYRINGE) ×1 IMPLANT
TRANSDUCER W/STOPCOCK (MISCELLANEOUS) ×2 IMPLANT
TRAY PV CATH (CUSTOM PROCEDURE TRAY) ×2 IMPLANT
TUBING CIL FLEX 10 FLL-RA (TUBING) ×1 IMPLANT
WIRE HITORQ VERSACORE ST 145CM (WIRE) ×1 IMPLANT

## 2018-03-02 NOTE — Discharge Instructions (Signed)

## 2018-03-02 NOTE — Interval H&P Note (Signed)
History and Physical Interval Note:  03/02/2018 10:18 AM  [name]  has presented today for surgery, with the diagnosis of pad  The various methods of treatment have been discussed with the patient and family. After consideration of risks, benefits and other options for treatment, the patient has consented to  Procedure(s): ABDOMINAL AORTOGRAM W/LOWER EXTREMITY (N/A) as a surgical intervention .  The patient's history has been reviewed, patient examined, no change in status, stable for surgery.  I have reviewed the patient's chart and labs.  Questions were answered to the patient's satisfaction.     Kathlyn Sacramento

## 2018-03-03 ENCOUNTER — Other Ambulatory Visit (HOSPITAL_COMMUNITY): Payer: Self-pay | Admitting: Cardiology

## 2018-03-03 ENCOUNTER — Encounter (HOSPITAL_COMMUNITY): Payer: Self-pay | Admitting: Cardiovascular Disease

## 2018-03-03 ENCOUNTER — Telehealth: Payer: Self-pay | Admitting: Cardiovascular Disease

## 2018-03-03 MED FILL — Heparin Sodium (Porcine) 2 Unit/ML in Sodium Chloride 0.9%: INTRAMUSCULAR | Qty: 1000 | Status: AC

## 2018-03-03 MED FILL — Lidocaine HCl Local Inj 1%: INTRAMUSCULAR | Qty: 20 | Status: AC

## 2018-03-03 NOTE — Telephone Encounter (Signed)
New message:   Pt calling to see if Dr. Fletcher Anon would like a f/u appt with her from procedure on yesterday.

## 2018-03-03 NOTE — Telephone Encounter (Signed)
Patient aware she has an MD follow up on 03/22/18 @ 940am

## 2018-03-07 ENCOUNTER — Ambulatory Visit (INDEPENDENT_AMBULATORY_CARE_PROVIDER_SITE_OTHER): Payer: Medicare Other

## 2018-03-07 DIAGNOSIS — Z95810 Presence of automatic (implantable) cardiac defibrillator: Secondary | ICD-10-CM | POA: Diagnosis not present

## 2018-03-07 DIAGNOSIS — I5022 Chronic systolic (congestive) heart failure: Secondary | ICD-10-CM

## 2018-03-07 NOTE — Progress Notes (Signed)
EPIC Encounter for ICM Monitoring  Patient Name: CHARICE ZUNO is a 67 y.o. female Date: 03/07/2018 Primary Care Physican: [place] [place] At Primary Cardiologist: Nishan/Bensimhon Electrophysiologist: Allred Dry Weight: 126lbs(weighs daily)      Heart Failure questions reviewed, pt feels like she has some fluid and weight has been fluctuating.  Patient had the flu in the last few weeks. She also was taking prednisone which she stated triggered her heart failure symptoms.   Thoracic impedance abnormal suggesting fluid accumulation 02/08/2018 and trending back toward baseline normal.  Prescribed dosage: Furosemide 20 mg 2 tablets (40 mg total) by mouth daily. May take additional 2 tablets as needed for weight gain/swelling  Labs: 02/22/2018 Creatinine 0.92, BUN 13, Potassium 4.6, Sodium 139, EGFR 64-73 02/09/2018 Creatinine 1.02, BUN 12, Potassium 4.2, Sodium 135, EGFR 55->60  01/17/2018 Creatinine 1.32, BUN 32, Potassium 4.9, Sodium 132, EGFR 40-47  01/16/2018 Creatinine 1.54, BUN 35, Potassium 3.9, Sodium 134, EGFR 34-39  01/15/2018 Creatinine 1.27, BUN 23, Potassium 4.1, Sodium 134, EGFR 42-49  01/14/2018 Creatinine 1.9, BUN 17, Potassium 4.1, Sodium 134, EGFR 46-53  11/26/2017 Creatinine 1.08, BUN 16, Potassium 4.6, Sodium 134, EGFR 52-60 07/30/2017 Creatinine 1.24, BUN 14, Potassium 4.6, Sodium 136, EGFR 44-51 03/26/2017 Creatinine 1.08, BUN 18, Potassium 4.0, Sodium 138, EGFR 52-60  Recommendations:   She is going to take extra Furosemide as prescribed for 2 days. Reinforced fluid restriction to < 2 L daily and sodium restriction to less than 2000 mg daily.   Follow-up plan: ICM clinic phone appointment on 03/11/2018.    Copy of ICM check sent to Dr. Rayann Heman and Dr. Haroldine Laws.   3 month ICM trend: 03/07/2018    1 Year ICM trend:       Rosalene Billings, RN 03/07/2018 2:01 PM

## 2018-03-10 NOTE — Progress Notes (Signed)
Received: Yesterday  Message Contents  Thompson Grayer, MD  Terre Hanneman Panda, RN        Agreed

## 2018-03-11 ENCOUNTER — Ambulatory Visit (INDEPENDENT_AMBULATORY_CARE_PROVIDER_SITE_OTHER): Payer: Self-pay

## 2018-03-11 DIAGNOSIS — I5022 Chronic systolic (congestive) heart failure: Secondary | ICD-10-CM

## 2018-03-11 DIAGNOSIS — Z95810 Presence of automatic (implantable) cardiac defibrillator: Secondary | ICD-10-CM

## 2018-03-11 NOTE — Progress Notes (Signed)
EPIC Encounter for ICM Monitoring  Patient Name: [name] is a 67 y.o. female Date: 03/11/2018 Primary Care Physican: [place], [place] At Primary Cardiologist: Nishan/Bensimhon Electrophysiologist: Allred Dry Weight: 126lbs(weighs daily)  Heart Failure questions reviewed, pt feels like she still has some fluid due to weight returned to 126 lbs.    Thoracic impedance improved and trending toward baseline but remains abnormal suggesting fluid accumulation after taking extra Furosemide x 2 days.  Prescribed dosage: Furosemide 20 mg 2 tablets (40 mg total) by mouth daily. May take additional 2 tablets as needed for weight gain/swelling  Labs: 02/22/2018 Creatinine 0.92, BUN 13, Potassium 4.6, Sodium 139, EGFR 64-73 02/09/2018 Creatinine 1.02, BUN 12, Potassium 4.2, Sodium 135, EGFR 55->60  01/17/2018 Creatinine 1.32, BUN 32, Potassium 4.9, Sodium 132, EGFR 40-47  01/16/2018 Creatinine 1.54, BUN 35, Potassium 3.9, Sodium 134, EGFR 34-39  01/15/2018 Creatinine 1.27, BUN 23, Potassium 4.1, Sodium 134, EGFR 42-49  01/14/2018 Creatinine 1.9, BUN 17,   Potassium 4.1, Sodium 134, EGFR 46-53  11/26/2017 Creatinine 1.08, BUN 16, Potassium 4.6, Sodium 134, EGFR 52-60 07/30/2017 Creatinine 1.24, BUN 14, Potassium 4.6, Sodium 136, EGFR 44-51 03/26/2017 Creatinine 1.08, BUN 18, Potassium 4.0, Sodium 138, EGFR 52-60  Recommendations:  Advised patient to take extra 2 tablets of Furosemide as prescribed x 2 days.  She said she only took the extra for one day earlier this week.    Follow-up plan: ICM clinic phone appointment on 03/15/2018 to recheck fluid levels (manual send).  Office appointment scheduled 04/18/2018 with HF clinic NP/PA.  Copy of ICM check sent to Dr. Rayann Heman and Dr. Haroldine Laws.   3 month ICM trend: 03/11/2018    1 Year ICM trend:       Rosalene Billings, RN 03/11/2018 8:01 AM

## 2018-03-15 ENCOUNTER — Ambulatory Visit (INDEPENDENT_AMBULATORY_CARE_PROVIDER_SITE_OTHER): Payer: Self-pay

## 2018-03-15 DIAGNOSIS — Z95810 Presence of automatic (implantable) cardiac defibrillator: Secondary | ICD-10-CM

## 2018-03-15 DIAGNOSIS — I5022 Chronic systolic (congestive) heart failure: Secondary | ICD-10-CM

## 2018-03-15 NOTE — Progress Notes (Signed)
EPIC Encounter for ICM Monitoring  Patient Name: [name] is a 67 y.o. female Date: 03/15/2018 Primary Care Physican: [place], [place] At Primary Cardiologist: Nishan/Bensimhon Electrophysiologist: Allred Dry Weight: 126lbs(weighs daily)        Heart Failure questions reviewed, pt asymptomatic.  She says she tries to follow low salt diet but does not look at food labels for salt content.  Also eats out at restaurants and explained restaurant foods are typically high in sodium.  Advised to review food labels.  Remains at 126 lbs   Thoracic impedance remains abnormal suggesting fluid accumulation even after taking extra Furosemide.  Prescribed dosage: Furosemide 20 mg 2 tablets (40 mg total) by mouth daily. May take additional 2 tablets as needed for weight gain/swelling  Labs: 02/22/2018 Creatinine0.92, BUN13, Potassium4.6, Sodium139, [id number] 02/09/2018 Creatinine1.02, BUN12, Potassium4.2, Sodium135, EGFR55->60  01/17/2018 Creatinine1.32, BUN32, Potassium4.9, Sodium132, EGFR40-47  01/16/2018 Creatinine1.54, BUN35, Potassium3.9, Sodium134, WEXH37-16  01/15/2018 Creatinine1.27, BUN23, Potassium4.1, Sodium134, [id number]  01/14/2018 Creatinine1.9, BUN17,   Potassium4.1, OFBPZW258, [id number] 11/26/2017 Creatinine 1.08, BUN 16, Potassium 4.6, Sodium 134, EGFR 52-60 07/30/2017 Creatinine 1.24, BUN 14, Potassium 4.6, Sodium 136, EGFR 44-51 03/26/2017 Creatinine 1.08, BUN 18, Potassium 4.0, Sodium 138, EGFR 52-60  Recommendations:  Advised to take additional 2 Furosemide tablets as prescribed x 2 more days. Reinforced fluid restriction to < 2 L daily and sodium restriction to less than 2000 mg daily.  Encouraged to call for fluid symptoms.  Follow-up plan: ICM clinic phone appointment on 03/18/2018.   Office appointment scheduled 04/18/2018 with HF Clinic NP/PA.  Copy of ICM check sent to Dr. Rayann Heman and Dr. Haroldine Laws.   3 month ICM  trend: 03/15/2018    1 Year ICM trend:       Rosalene Billings, RN 03/15/2018 1:23 PM

## 2018-03-18 ENCOUNTER — Ambulatory Visit (INDEPENDENT_AMBULATORY_CARE_PROVIDER_SITE_OTHER): Payer: Self-pay

## 2018-03-18 ENCOUNTER — Telehealth (HOSPITAL_COMMUNITY): Payer: Self-pay | Admitting: *Deleted

## 2018-03-18 DIAGNOSIS — Z95810 Presence of automatic (implantable) cardiac defibrillator: Secondary | ICD-10-CM

## 2018-03-18 DIAGNOSIS — I5022 Chronic systolic (congestive) heart failure: Secondary | ICD-10-CM

## 2018-03-18 NOTE — Telephone Encounter (Signed)
Patient called me back and she stated she does feel like she is retaining fluid.  She took two extra lasix this afternoon per her prescription.  Her weight this morning was 127 lbs.  Yesterday it was 126 lbs.  She has some swelling in her lower extremities but no shortness of breath.    I have scheduled her for a nurse visit on Monday at 11:00 AM and I will get a vest reading at that time.  I also advised her to take two extra lasix tomorrow if she still has swelling or her weight continues to increase per her prescription.  She is agreeable with plan and no further questions.

## 2018-03-18 NOTE — Telephone Encounter (Signed)
Advanced Heart Failure Triage Encounter  Patient Name: [name]  Date of Call: 03/18/18  Problem:  Called patient to check on her regarding her fluid.  Her device showed she may have some extra fluid on her.  I called her mobile number with no answer and no VM setup.  I called her home number but had to leave message asking for her to call us back.   Plan:  Will continue to try and reach patient.   Darron Doom, RN

## 2018-03-18 NOTE — Progress Notes (Signed)
EPIC Encounter for ICM Monitoring  Patient Name: [name] is a 67 y.o. female Date: 03/18/2018 Primary Care Physican: [place], [place] At Primary Cardiologist: Nishan/Bensimhon Electrophysiologist: Allred Dry Weight: 127lbs(weighs daily)                                       Heart Failure questions reviewed, pt has 1 lb weight increase since 4/9.  She reported increased urination after taking extra Furosemide.  She reported limiting salt intake and trying to eat out less.    Thoracic impedance remains abnormal suggesting fluid accumulation even after taking extra Furosemide x 2 days per week for the past 2 weeks.  Prescribed dosage: Furosemide 20 mg 2 tablets (40 mg total) by mouth daily. May take additional 2 tablets as needed for weight gain/swelling  Labs: 02/22/2018 Creatinine0.92, BUN13, Potassium4.6, Sodium139, [id number] 02/09/2018 Creatinine1.02, BUN12, Potassium4.2, Sodium135, EGFR55->60  01/17/2018 Creatinine1.32, BUN32, Potassium4.9, Sodium132, EGFR40-47  01/16/2018 Creatinine1.54, BUN35, Potassium3.9, Sodium134, [id number]  01/15/2018 Creatinine1.27, BUN23, Potassium4.1, Sodium134, LSLH73-42  01/14/2018 Creatinine1.9,   BUN17, Potassium4.1, [id number], [id number] 11/26/2017 Creatinine 1.08, BUN 16, Potassium 4.6, Sodium 134, EGFR 52-60 07/30/2017 Creatinine 1.24, BUN 14, Potassium 4.6, Sodium 136, EGFR 44-51 03/26/2017 Creatinine 1.08, BUN 18, Potassium 4.0, Sodium 138, EGFR 52-60  Recommendations:  Copy of ICM check sent to Dr. Haroldine Laws and Dr. Rayann Heman for review and recommendations since taking extra Furosemide has not resolved fluid accumulation.   Reinforced fluid restriction to < 2 L daily and sodium restriction to less than 2000 mg daily.  Encouraged to call for fluid symptoms.  Follow-up plan: ICM clinic phone appointment on 03/24/2018 to recheck fluid levels (manual send).  Office appointment scheduled  04/18/2018 with HF clinic NP/PA.  Copy of ICM check sent to Dr. Haroldine Laws and Dr. Rayann Heman.   3 month ICM trend: 03/18/2018    1 Year ICM trend:       Rosalene Billings, RN 03/18/2018 10:02 AM

## 2018-03-21 ENCOUNTER — Ambulatory Visit (HOSPITAL_COMMUNITY)
Admission: RE | Admit: 2018-03-21 | Discharge: 2018-03-21 | Disposition: A | Discharge status: Home / Self Care | Payer: Medicare Other | Source: Ambulatory Visit | Attending: Internal Medicine | Admitting: Internal Medicine

## 2018-03-21 VITALS — BP 104/60 | HR 86 | Wt 129.6 lb

## 2018-03-21 DIAGNOSIS — I5022 Chronic systolic (congestive) heart failure: Secondary | ICD-10-CM | POA: Diagnosis not present

## 2018-03-21 LAB — BASIC METABOLIC PANEL
ANION GAP: 9 (ref 5–15)
BUN: 10 mg/dL (ref 6–20)
CHLORIDE: 104 mmol/L (ref 101–111)
CO2: 24 mmol/L (ref 22–32)
Calcium: 9 mg/dL (ref 8.9–10.3)
Creatinine, Ser: 1.11 mg/dL — ABNORMAL HIGH (ref 0.44–1.00)
GFR calc non Af Amer: 49 mL/min — ABNORMAL LOW (ref >60)
GFR, EST AFRICAN AMERICAN: 57 mL/min — AB (ref >60)
Glucose, Bld: 104 mg/dL — ABNORMAL HIGH (ref 65–99)
Potassium: 4.3 mmol/L (ref 3.5–5.1)
Sodium: 137 mmol/L (ref 135–145)

## 2018-03-21 LAB — BRAIN NATRIURETIC PEPTIDE: B Natriuretic Peptide: 726.9 pg/mL — ABNORMAL HIGH (ref 0.0–100.0)

## 2018-03-21 NOTE — Patient Instructions (Signed)
Labs today (will call for abnormal results, otherwise no news is good news)  No changes at this time, continue taking lasix 40 mg Daily.  Follow up as scheduled.

## 2018-03-21 NOTE — Progress Notes (Signed)
Patient came in for nurse visit.  Vest reading and vitals completed.  Per Barrington Ellison, PA bmet and bnp ordered.  No further changes at this time, patient will follow up as scheduled.

## 2018-03-22 ENCOUNTER — Ambulatory Visit (INDEPENDENT_AMBULATORY_CARE_PROVIDER_SITE_OTHER): Payer: Medicare Other | Admitting: Cardiovascular Disease

## 2018-03-22 ENCOUNTER — Encounter: Payer: Self-pay | Admitting: Cardiovascular Disease

## 2018-03-22 VITALS — BP 90/60 | HR 91 | Ht 63.5 in | Wt 131.0 lb

## 2018-03-22 DIAGNOSIS — E785 Hyperlipidemia, unspecified: Secondary | ICD-10-CM

## 2018-03-22 DIAGNOSIS — I739 Peripheral vascular disease, unspecified: Secondary | ICD-10-CM

## 2018-03-22 DIAGNOSIS — I5022 Chronic systolic (congestive) heart failure: Secondary | ICD-10-CM | POA: Diagnosis not present

## 2018-03-22 DIAGNOSIS — Z720 Tobacco use: Secondary | ICD-10-CM | POA: Diagnosis not present

## 2018-03-22 DIAGNOSIS — I259 Chronic ischemic heart disease, unspecified: Secondary | ICD-10-CM

## 2018-03-22 DIAGNOSIS — I2510 Atherosclerotic heart disease of native coronary artery without angina pectoris: Secondary | ICD-10-CM | POA: Diagnosis not present

## 2018-03-22 NOTE — Patient Instructions (Signed)
Medication Instructions: Your physician recommends that you continue on your current medications as directed. Please refer to the Current Medication list given to you today.  If you need a refill on your cardiac medications before your next appointment, please call your pharmacy.   Follow-Up: Your physician wants you to follow-up in 4 months with Dr. Arida.    Thank you for choosing [facility]!!      

## 2018-03-22 NOTE — Progress Notes (Signed)
Cardiology Office Note   Date:  03/22/2018   ID:  [name], DOB [birth year], MRN [id number]  PCP:  [facility]  Cardiologist:  Dr. Eber Jones  Chief Complaint  Patient presents with  . s/p angio f/u visit    pt c/o left leg pain      History of Present Illness: [name] is a 67 y.o. female who is here today for a follow up visit regarding peripheral arterial disease. She has a hx of CAD, s/p prior Ant MI, s/p prior POBA to LAD, CHF due to ICM with high scar burden by MRI (12/09), s/p ICD, HTN, HL, depression, carotid disease and tobacco abuse.  Most recent vascular studies showed an ABI of 0.96 on the right and 0.69 on the left.  She was hospitalized in February for the flu.  Since hospital discharge, she noted significant worsening of left calf claudication with minimal walking.    I proceeded with angiography last month which showed no significant aortoiliac disease.  On the left side, there was diffuse 60-70% disease affecting the proximal SFA followed by short occlusion distally with reconstitution via collaterals from the profunda and three-vessel runoff below the knee.  Her symptoms have been stable.  She continues to smoke about 6 cigarettes a day.  Past Medical History:  Diagnosis Date  . Arthritis    hands  . Asthma   . CAD (coronary artery disease)    a. s/p prior Ant MI, b. s/p prior POBA to LAD, Dx, RCA,;  c.  LHC (10/12):  dLAD 40, pCFX 30, OM1 50, pRCA 30;  d.  Carlton Adam Myoview (11/14):  High risk study; inferior, anterior, apical defects; minimal reversibility toward the apex; consistent with prior infarct and very mild peri-infarct ischemia, EF 24%, inferior/apical/anterior akinesis  . Chronic systolic heart failure ([place])   . Depression   . H/O hiatal hernia   . HLD (hyperlipidemia)   . HTN (hypertension)   . Ischemic cardiomyopathy    MRI (12/09):  High scar burden, Inf and Apical AK, EF 24%.;  Echo (10/14): EF  15%, Gr 2 DD, mild to mod MR, mod LAE, RVSF mildly reduced, mod RAE, severe TR, PASP 49-53 (mod pulmonary HTN)  . MI (myocardial infarction) ([place])   . Tobacco abuse     Past Surgical History:  Procedure Laterality Date  . ABDOMINAL AORTOGRAM W/LOWER EXTREMITY N/A 03/02/2018   Procedure: ABDOMINAL AORTOGRAM W/LOWER EXTREMITY;  Surgeon: Wellington Hampshire, MD;  Location: [facility];  Service: Cardiovascular;  Laterality: N/A;  . CARDIAC DEFIBRILLATOR PLACEMENT  01/18/2009   MDT single chamber ICD implanted by Dr Rayann Heman   . cardiac stents    . COLONOSCOPY  03/18/2012   Procedure: COLONOSCOPY;  Surgeon: Beryle Beams, MD;  Location: [facility];  Service: Endoscopy;  Laterality: N/A;  . CORONARY ANGIOPLASTY    . ESOPHAGOGASTRODUODENOSCOPY N/A 03/10/2013   Procedure: ESOPHAGOGASTRODUODENOSCOPY (EGD);  Surgeon: Beryle Beams, MD;  Location: [facility];  Service: Endoscopy;  Laterality: N/A;  . LEFT HEART CATHETERIZATION WITH CORONARY ANGIOGRAM N/A 07/26/2014   Procedure: LEFT HEART CATHETERIZATION WITH CORONARY ANGIOGRAM;  Surgeon: Jolaine Artist, MD;  Location: [facility];  Service: Cardiovascular;  Laterality: N/A;     Current Outpatient Medications  Medication Sig Dispense Refill  . acetaminophen (TYLENOL ARTHRITIS PAIN) 650 MG CR tablet Take 650 mg by mouth every 8 (eight) hours as needed for pain.    Marland Kitchen albuterol (PROAIR  HFA) 108 (90 Base) MCG/ACT inhaler Inhale 2 puffs into the lungs every 6 (six) hours as needed for wheezing.    Marland Kitchen aspirin 81 MG tablet Take 81 mg by mouth daily.      Marland Kitchen azelastine (ASTELIN) 0.1 % nasal spray Place 1 spray into both nostrils daily. Use in each nostril as directed     . budesonide-formoterol (SYMBICORT) 160-4.5 MCG/ACT inhaler Inhale 2 puffs into the lungs 2 (two) times daily.    . citalopram (CELEXA) 20 MG tablet Take 20 mg by mouth daily.    . clonazePAM (KLONOPIN) 0.5 MG tablet Take 0.25-0.5 mg by mouth 2 (two) times daily as needed for  anxiety.    Marland Kitchen ezetimibe (ZETIA) 10 MG tablet TAKE 1/2 TABLET TWICE A WEEK 5 tablet 11  . fexofenadine (ALLEGRA) 180 MG tablet Take 180 mg by mouth daily as needed for allergies.     . fish oil-omega-3 fatty acids 1000 MG capsule Take 2 g by mouth daily.     . furosemide (LASIX) 20 MG tablet TAKE 2 TABLETS BY MOUTH DAILY. MAY TAKE ADDITIONAL 2 TABS AS NEEDED FOR SWELLING/ SHORT OF BREATH 120 tablet 3  . isosorbide mononitrate (IMDUR) 30 MG 24 hr tablet Take 30 mg by mouth daily.    . nitroGLYCERIN (NITROSTAT) 0.4 MG SL tablet Place 1 tablet (0.4 mg total) under the tongue every 5 (five) minutes as needed for chest pain. 25 tablet 3  . omeprazole (PRILOSEC) 40 MG capsule Take 40 mg by mouth daily.     . polyethylene glycol (MIRALAX / GLYCOLAX) packet Take 17 g by mouth at bedtime.    . rosuvastatin (CRESTOR) 5 MG tablet TAKE 1 TABLET BY MOUTH EVERY DAY ON MONDAY, WEDNESDAY,AND FRIDAY 30 tablet 6  . sacubitril-valsartan (ENTRESTO) 49-51 MG Take 1 tablet by mouth 2 (two) times daily. 180 tablet 3  . spironolactone (ALDACTONE) 25 MG tablet Take 1 tablet (25 mg total) by mouth daily. 30 tablet 0   No current facility-administered medications for this visit.     Allergies:   Prednisone; Zebeta [bisoprolol fumarate]; and Statins    Social History:  The patient  reports that she has been smoking cigarettes.  She has never used smokeless tobacco. She reports that she does not drink alcohol or use drugs.   Family History:  The patient's family history is not on file.    ROS:  Please see the history of present illness.   Otherwise, review of systems are positive for none.   All other systems are reviewed and negative.    PHYSICAL EXAM: VS:  BP 90/60   Pulse 91   Ht 5' 3.5" (1.613 m)   Wt 131 lb (59.4 kg)   BMI 22.84 kg/m  , BMI Body mass index is 22.84 kg/m. GEN: Well nourished, well developed, in no acute distress  HEENT: normal  Neck: no JVD, carotid bruits, or masses Cardiac: RRR; no  murmurs, rubs, or gallops,no edema  Respiratory:  clear to auscultation bilaterally, normal work of breathing GI: soft, nontender, nondistended, + BS MS: no deformity or atrophy  Skin: warm and dry, no rash Neuro:  Strength and sensation are intact Psych: euthymic mood, full affect Vascular: Femoral pulses normal bilaterally. Distal pulses are not palpable.  No groin hematoma.   EKG:  EKG is not ordered today.    Recent Labs: 01/17/2018: Magnesium 2.0 02/22/2018: Hemoglobin 11.0; Platelets 180 03/21/2018: B Natriuretic Peptide 726.9; BUN 10; Creatinine, Ser 1.11; Potassium 4.3; Sodium 137  Lipid Panel    Component Value Date/Time   CHOL 165 08/04/2016 1033   TRIG 117 08/04/2016 1033   HDL 65 08/04/2016 1033   CHOLHDL 2.5 08/04/2016 1033   VLDL 23 08/04/2016 1033   LDLCALC 77 08/04/2016 1033   LDLDIRECT 134.7 12/13/2013 1043      Wt Readings from Last 3 Encounters:  03/22/18 131 lb (59.4 kg)  03/21/18 129 lb 9.6 oz (58.8 kg)  03/02/18 124 lb (56.2 kg)       PAD Screen 08/24/2017  Previous PAD dx? Yes  Previous surgical procedure? No  Pain with walking? Yes  Subsides with rest? Yes  Feet/toe relief with dangling? No  Painful, non-healing ulcers? No  Extremities discolored? No      ASSESSMENT AND PLAN:  1.  Peripheral arterial disease: Moderate to severe left calf claudication due to occluded left SFA.  I discussed the natural history and management of claudication.  I strongly advised her to control risk factors especially smoking cessation.  I have recommended a structured exercise program via cardiac rehab.  However, she is not able to attend due to distance from her house.   I gave her instructions about how to do the walking program at home and she is going to do that over the next few months.  She is not a good candidate for cilostazol given chronic systolic heart failure.   Follow-up in 4 months to reevaluate symptoms.    2. Coronary artery disease  involving native coronary arteries with stable angina: No significant worsening of symptoms.  Continue medical therapy.  3. Chronic systolic heart failure: She appears to be euvolemic  4. Tobacco use: I again discussed with her the importance of smoking cessation.  5. Hyperlipidemia: Continue treatment with Zetia and rosuvastatin.    Disposition:   FU with me in 4 months  Signed,  Kathlyn Sacramento, MD  03/22/2018 10:20 AM    [place]

## 2018-03-23 NOTE — Telephone Encounter (Signed)
Seen in Clinic. ReDs vest 28%. No changes made, instructed to return to normal lasix dosing.

## 2018-03-24 ENCOUNTER — Ambulatory Visit (INDEPENDENT_AMBULATORY_CARE_PROVIDER_SITE_OTHER): Payer: Self-pay

## 2018-03-24 ENCOUNTER — Telehealth: Payer: Self-pay

## 2018-03-24 DIAGNOSIS — Z95810 Presence of automatic (implantable) cardiac defibrillator: Secondary | ICD-10-CM

## 2018-03-24 DIAGNOSIS — I509 Heart failure, unspecified: Secondary | ICD-10-CM

## 2018-03-24 NOTE — Progress Notes (Signed)
EPIC Encounter for ICM Monitoring  Patient Name: [name] is a 67 y.o. female Date: 03/24/2018 Primary Care Physican: [place] [place] [facility] Cardiologist: Nishan/Bensimhon Electrophysiologist: Allred Dry Weight: 127lbs(weighs daily)       Patient reported she is feeling okay but always is tired and seems she has had shortness of breath for 2-3 months. She asks if the device settings were changed would that help with her symptoms.  Advised I would send message to Dr Rayann Heman regarding her question.   ReDS vest was 28% which is within normal limits on 03/23/2018.  HF clinic advised her to return to prescribed Lasix dosage.     Thoracic impedance remains abnormal suggesting fluid accumulation even after taking extra Furosemide in the last couple of weeks.  Difficult to determine Optivol accuracy since ReDs vest was within normal limits.   Prescribed dosage: Furosemide 20 mg 2 tablets (40 mg total) by mouth daily. May take additional 2 tablets as needed for weight gain/swelling  Labs: 03/21/2018 Creatinine 1.11, BUN 10, Potassium 4.3, Sodium 137, EGFR 49-57  BNP 726.9 02/22/2018 Creatinine0.92, BUN13, Potassium4.6, Sodium139, [id number] 02/09/2018 Creatinine1.02, BUN12, Potassium4.2, Sodium135, EGFR55->60  01/17/2018 Creatinine1.32, BUN32, Potassium4.9, Sodium132, EGFR40-47  01/16/2018 Creatinine1.54, BUN35, Potassium3.9, Sodium134, HYWV37-10  01/15/2018 Creatinine1.27, BUN23, Potassium4.1, Sodium134, GYIR48-54  01/14/2018 Creatinine1.9,   BUN17, Potassium4.1, OEVOJJ009, FGHW29-93 11/26/2017 Creatinine 1.08, BUN 16, Potassium 4.6, Sodium 134, EGFR 52-60 07/30/2017 Creatinine 1.24, BUN 14, Potassium 4.6, Sodium 136, EGFR 44-51 03/26/2017 Creatinine 1.08, BUN 18, Potassium 4.0, Sodium 138, EGFR 52-60  Recommendations:  No changes.    Encouraged to call for fluid symptoms.  Follow-up plan: ICM clinic phone appointment on  05/04/2018.  Office appointment scheduled 04/18/2018 with HF clinic NP/PA.  Copy of ICM check sent to Dr. Rayann Heman, Dr. Haroldine Laws, and Oda Kilts, [place].   3 month ICM trend: 03/24/2018    1 Year ICM trend:       Rosalene Billings, RN 03/24/2018 4:32 PM

## 2018-03-24 NOTE — Telephone Encounter (Signed)
Remote ICM transmission received.  Attempted call to patient and left message per DPR to return call regarding transmission.  

## 2018-03-25 NOTE — Progress Notes (Signed)
Optivol markedly elevated, but out of proportion to Exam, ReDs vest, and labwork.     HF recommendation is to continue to follow for now.   Thank you.    Legrand Como [address]" [place], PA-C 03/25/2018 12:14 PM

## 2018-03-30 ENCOUNTER — Other Ambulatory Visit: Payer: Self-pay

## 2018-03-31 MED ORDER — ROSUVASTATIN CALCIUM 5 MG PO TABS: 5.0000 mg | ORAL_TABLET | ORAL | 6 refills | Status: DC

## 2018-04-13 ENCOUNTER — Other Ambulatory Visit: Payer: Self-pay | Admitting: Family Medicine

## 2018-04-13 DIAGNOSIS — F17200 Nicotine dependence, unspecified, uncomplicated: Secondary | ICD-10-CM

## 2018-04-18 ENCOUNTER — Encounter (HOSPITAL_COMMUNITY): Payer: Self-pay

## 2018-04-18 ENCOUNTER — Ambulatory Visit (HOSPITAL_COMMUNITY)
Admission: RE | Admit: 2018-04-18 | Discharge: 2018-04-18 | Disposition: A | Discharge status: Home / Self Care | Payer: Medicare Other | Source: Ambulatory Visit | Attending: Cardiology | Admitting: Cardiology

## 2018-04-18 VITALS — BP 100/58 | HR 95 | Wt 127.4 lb

## 2018-04-18 DIAGNOSIS — Z7982 Long term (current) use of aspirin: Secondary | ICD-10-CM | POA: Insufficient documentation

## 2018-04-18 DIAGNOSIS — Z720 Tobacco use: Secondary | ICD-10-CM | POA: Diagnosis not present

## 2018-04-18 DIAGNOSIS — J209 Acute bronchitis, unspecified: Secondary | ICD-10-CM | POA: Insufficient documentation

## 2018-04-18 DIAGNOSIS — I10 Essential (primary) hypertension: Secondary | ICD-10-CM | POA: Diagnosis not present

## 2018-04-18 DIAGNOSIS — N183 Chronic kidney disease, stage 3 unspecified: Secondary | ICD-10-CM

## 2018-04-18 DIAGNOSIS — I110 Hypertensive heart disease with heart failure: Secondary | ICD-10-CM | POA: Insufficient documentation

## 2018-04-18 DIAGNOSIS — F329 Major depressive disorder, single episode, unspecified: Secondary | ICD-10-CM | POA: Insufficient documentation

## 2018-04-18 DIAGNOSIS — Z79899 Other long term (current) drug therapy: Secondary | ICD-10-CM | POA: Diagnosis not present

## 2018-04-18 DIAGNOSIS — I255 Ischemic cardiomyopathy: Secondary | ICD-10-CM | POA: Insufficient documentation

## 2018-04-18 DIAGNOSIS — I739 Peripheral vascular disease, unspecified: Secondary | ICD-10-CM | POA: Insufficient documentation

## 2018-04-18 DIAGNOSIS — F419 Anxiety disorder, unspecified: Secondary | ICD-10-CM | POA: Insufficient documentation

## 2018-04-18 DIAGNOSIS — E785 Hyperlipidemia, unspecified: Secondary | ICD-10-CM | POA: Insufficient documentation

## 2018-04-18 DIAGNOSIS — M19049 Primary osteoarthritis, unspecified hand: Secondary | ICD-10-CM | POA: Diagnosis not present

## 2018-04-18 DIAGNOSIS — F17210 Nicotine dependence, cigarettes, uncomplicated: Secondary | ICD-10-CM | POA: Diagnosis not present

## 2018-04-18 DIAGNOSIS — Z95810 Presence of automatic (implantable) cardiac defibrillator: Secondary | ICD-10-CM | POA: Insufficient documentation

## 2018-04-18 DIAGNOSIS — I5022 Chronic systolic (congestive) heart failure: Secondary | ICD-10-CM | POA: Insufficient documentation

## 2018-04-18 DIAGNOSIS — J440 Chronic obstructive pulmonary disease with acute lower respiratory infection: Secondary | ICD-10-CM | POA: Diagnosis not present

## 2018-04-18 DIAGNOSIS — I6523 Occlusion and stenosis of bilateral carotid arteries: Secondary | ICD-10-CM | POA: Diagnosis not present

## 2018-04-18 DIAGNOSIS — I2510 Atherosclerotic heart disease of native coronary artery without angina pectoris: Secondary | ICD-10-CM | POA: Insufficient documentation

## 2018-04-18 DIAGNOSIS — Z888 Allergy status to other drugs, medicaments and biological substances status: Secondary | ICD-10-CM | POA: Diagnosis not present

## 2018-04-18 DIAGNOSIS — I252 Old myocardial infarction: Secondary | ICD-10-CM | POA: Insufficient documentation

## 2018-04-18 DIAGNOSIS — Z7951 Long term (current) use of inhaled steroids: Secondary | ICD-10-CM | POA: Insufficient documentation

## 2018-04-18 LAB — BASIC METABOLIC PANEL
Anion gap: 7 (ref 5–15)
BUN: 21 mg/dL — ABNORMAL HIGH (ref 6–20)
CALCIUM: 9.1 mg/dL (ref 8.9–10.3)
CO2: 24 mmol/L (ref 22–32)
CREATININE: 1.28 mg/dL — AB (ref 0.44–1.00)
Chloride: 105 mmol/L (ref 101–111)
GFR calc Af Amer: 48 mL/min — ABNORMAL LOW (ref >60)
GFR, EST NON AFRICAN AMERICAN: 42 mL/min — AB (ref >60)
GLUCOSE: 111 mg/dL — AB (ref 65–99)
Potassium: 4 mmol/L (ref 3.5–5.1)
Sodium: 136 mmol/L (ref 135–145)

## 2018-04-18 LAB — MAGNESIUM: Magnesium: 1.8 mg/dL (ref 1.7–2.4)

## 2018-04-18 NOTE — Progress Notes (Addendum)
Advanced Heart Failure Note   Patient ID: [name], female   DOB: [birth year], 67 y.o.   MRN: [id number]  Date:  04/18/2018   ID:  [name], DOB 1951/02/13, MRN [id number]  PCP:  [name] in [place] Electrophysiologist:  Dr. Thompson Grayer  General Cardiologist: Dr Johnsie Cancel HF: Dr Haroldine Laws   History of Present Illness: [name] is a 67 y.o. female who was initially referred to the HF Clinic for evaluation for advanced therapies.   She has a hx of CAD, s/p prior Ant MI, s/p prior POBA to LAD, Dx, RCA, CHF due to ICM with high scar burden by MRI (12/09), s/p ICD, HTN, HL, depression, tobacco abuse.   ABI 7/14 R 0.93 L 0.95 MRI (12/09):  High scar burden, Inf and Apical AK, EF 24%.  Echo 10/14 EF 15% global HK. RV mild HK  Mod MR. Severe TR. PA 34mmHG   Lexiscan Myoview (11/14): LV Ejection Fraction: 24%. Extensive scar in the inferior wall, apex and anterior wall. Minimal reversibility Carotid Doppler : August 2015 showed 60-79% bilateral ICA stenosis. No previous CVA. Cath 07/26/14 Left main: Calcified 40% mid to distal stenosis LAD: Long vessel wrapping the apex 20-30% plaque in midsection. Distal vessel is small.  LCX: Large OM-1 and large OM-2. 50% lesion in proximal OM-2 RCA: Dominant vessel. Calcified, eccentric 50-60% lesion in midsection . LV-gram done in the RAO projection: Ejection fraction = 25% Severe global HK of mid to distal LV. No MR.    CPX 11/14 FVC 2.08 (67%)  FEV1 1.58 (66%)  FEV1/FVC 76%  Resting HR: 84 Peak HR: 142 (91% age predicted max HR) BP rest: 114/60 BP peak: 148/70 (IPE) Peak VO2: 15.0 (63% predicted peak VO2) VE/VCO2 slope: 32 OUES: 0.85 Peak RER: 1.33 Ventilatory Threshold: 12.1 (50.8% predicted peak VO2) VE/MVV: 60.1% O2pulse: 6 (75% predicted O2pulse)  Admitted 2/8 - 01/17/18 with Acute/bronchitis + Influenza A. Diuresed with IV lasix with A/C CHF. Discharge weight 128 lbs. Finished course of doxycyline as  outpatient.   She presents today for regular follow up. Being treated for acute bronchitis. Following Dr. Fletcher Anon who recommends medical therapy for PAD.  Denies dizziness or lightheadedness. Takes extra lasix 2-4 times a month. Smoking 6-8 cigarettes a day, PCP has offered Wellbutrin to stop. Appetite decreased with current illness. Leg fatigue still limits exercise. Taking all medication as directed. Limiting salt and fluid.   LE angiography 03/02/18 showed no signficant aortoiliac disease. Left sided showed diffuse 60-70% disease affecting the proximal SFA followed by short occlusion distally with reconstitution via collaterals from the profunda and three-vessel runoff below the knee.   ICD: Interrogated personally. Optivol has been above threshold since early March, with diminished thoracic impedence. Suspect drift in the setting of COPD.  Pt activity ~4 hrs daily. No VT/VF.   Echo 09/17/16 :LVEF 25-30%, Grade 1  Labs (10/12):  K 3.2, creatinine 0.9, Hgb 13.5 Labs (9/14):    K 3.4, creatinine 1.0, pro BNP 1560, Hgb 11.8, HDL 25, LDL 60, ALT 29 Labs (10/14):  K 3.3=> 4.2, creatinine 1.2, BNP 1175 Labs (10/02/13) K 3.2 creatinine 0.9 Labs (12/13/13): LDL 134, AST 24, ALT 24, TC 221, TG 104 Labs (8/15): K 5.1, creatinine 1.1  Labs (11/15): LDL 79, HDL 67 Labs (3/16): K 3.9, creatinine 1.13  Wt Readings from Last 3 Encounters:  03/22/18 131 lb (59.4 kg)  03/21/18 129 lb 9.6 oz (58.8 kg)  03/02/18 124 lb (56.2 kg)  Past Medical History:  Diagnosis Date  . Arthritis    hands  . Asthma   . CAD (coronary artery disease)    a. s/p prior Ant MI, b. s/p prior POBA to LAD, Dx, RCA,;  c.  LHC (10/12):  dLAD 40, pCFX 30, OM1 50, pRCA 30;  d.  Carl[facility] Adam Myoview (11/14):  High risk study; inferior, anterior, apical defects; minimal reversibility toward the apex; consistent with prior infarct and very mild peri-infarct ischemia, EF 24%, inferior/apical/anterior akinesis  . Chronic systolic heart  failure ([place])   . Depression   . H/O hiatal hernia   . HLD (hyperlipidemia)   . HTN (hypertension)   . Ischemic cardiomyopathy    MRI (12/09):  High scar burden, Inf and Apical AK, EF 24%.;  Echo (10/14): EF 15%, Gr 2 DD, mild to mod MR, mod LAE, RVSF mildly reduced, mod RAE, severe TR, PASP 49-53 (mod pulmonary HTN)  . MI (myocardial infarction) ([place])   . Tobacco abuse     Current Outpatient Medications  Medication Sig Dispense Refill  . acetaminophen (TYLENOL ARTHRITIS PAIN) 650 MG CR tablet Take 650 mg by mouth every 8 (eight) hours as needed for pain.    Marland Kitchen albuterol (PROAIR HFA) 108 (90 Base) MCG/ACT inhaler Inhale 2 puffs into the lungs every 6 (six) hours as needed for wheezing.    Marland Kitchen aspirin 81 MG tablet Take 81 mg by mouth daily.      Marland Kitchen azelastine (ASTELIN) 0.1 % nasal spray Place 1 spray into both nostrils daily. Use in each nostril as directed     . budesonide-formoterol (SYMBICORT) 160-4.5 MCG/ACT inhaler Inhale 2 puffs into the lungs 2 (two) times daily.    . citalopram (CELEXA) 20 MG tablet Take 20 mg by mouth daily.    . clonazePAM (KLONOPIN) 0.5 MG tablet Take 0.25-0.5 mg by mouth 2 (two) times daily as needed for anxiety.    Marland Kitchen ezetimibe (ZETIA) 10 MG tablet TAKE 1/2 TABLET TWICE A WEEK 5 tablet 11  . fexofenadine (ALLEGRA) 180 MG tablet Take 180 mg by mouth daily as needed for allergies.     . fish oil-omega-3 fatty acids 1000 MG capsule Take 2 g by mouth daily.     . furosemide (LASIX) 20 MG tablet TAKE 2 TABLETS BY MOUTH DAILY. MAY TAKE ADDITIONAL 2 TABS AS NEEDED FOR SWELLING/ SHORT OF BREATH 120 tablet 3  . isosorbide mononitrate (IMDUR) 30 MG 24 hr tablet Take 30 mg by mouth daily.    . nitroGLYCERIN (NITROSTAT) 0.4 MG SL tablet Place 1 tablet (0.4 mg total) under the tongue every 5 (five) minutes as needed for chest pain. 25 tablet 3  . omeprazole (PRILOSEC) 40 MG capsule Take 40 mg by mouth daily.     . polyethylene glycol (MIRALAX / GLYCOLAX) packet Take 17 g by  mouth at bedtime.    . rosuvastatin (CRESTOR) 5 MG tablet Take 1 tablet (5 mg total) by mouth as directed. Take 1 tablet by mouth on Monday, Wednesday, and Friday 30 tablet 6  . sacubitril-valsartan (ENTRESTO) 49-51 MG Take 1 tablet by mouth 2 (two) times daily. 180 tablet 3  . spironolactone (ALDACTONE) 25 MG tablet Take 1 tablet (25 mg total) by mouth daily. 30 tablet 0   No current facility-administered medications for this visit.    Allergies:    Allergies  Allergen Reactions  . Prednisone Shortness Of Breath, Swelling and Rash    Sore throat  . Zebeta [Bisoprolol Fumarate]  Pt states she couldn't think and it messed her head up  . Statins Other (See Comments)    Gradually tired with daily Lipitor, made pt feel bad (Pt can take Crestor 5 mg 4 times per week and Zetia 5 mg every other day) Other reaction(s): Other (See Comments) un   Social History:  The patient  reports that she has been smoking cigarettes.  She has never used smokeless tobacco. She reports that she does not drink alcohol or use drugs.   Review of systems complete and found to be negative unless listed in HPI.    Vitals:   04/18/18 1116  BP: (!) 100/58  Pulse: 95  SpO2: 99%  Weight: 127 lb 6.4 oz (57.8 kg)   Wt Readings from Last 3 Encounters:  04/18/18 127 lb 6.4 oz (57.8 kg)  03/22/18 131 lb (59.4 kg)  03/21/18 129 lb 9.6 oz (58.8 kg)    PHYSICAL EXAM:  General: Fatigued appearing. No resp difficulty. HEENT: Normal Neck: Supple. JVP 6-7 cm. Carotids 2+ bilat; no bruits. No thyromegaly or nodule noted. Cor: PMI nondisplaced. RRR, No M/G/R noted Lungs: Diminished basilar sounds, scattered rhonchi that clear with cough.  Abdomen: Soft, non-tender, non-distended, no HSM. No bruits or masses. +BS  Extremities: No cyanosis, clubbing, or rash. R and LLE no edema.  Neuro: Alert & orientedx3, cranial nerves grossly intact. moves all 4 extremities w/o difficulty. Affect pleasant   ASSESSMENT AND  PLAN:  1. CAD s/p previous anterior MI:  Cath 8/15 nonobstructive CAD.   -  Occasional CP in high-stress situations. Stable pattern.  - Continue ASA and statin.  - Followed by Dr. Johnsie Cancel  2.  Chronic Systolic CHF  due to [facility] with mild RV dysfunction s/p [employer] ICD.  - Echo 01/15/18 LVEF 20-25%.  - NYHA III, confounded by COPD and claudication. - Volume status stable on exam, though optivol with increased impedence. ReDs vest 29%. - Continue lasix 40 mg daily. Can take extra 20 mg as needed. BMET and Mg today.  - Continue entresto 49/51 mg BID. Intolerant to up-titration with hypotension.  - Continue spironolactone 25 mg daily. - Does not tolerate beta blockers due to symptomatic brady in past. - Reinforced fluid restriction to < 2 L daily, sodium restriction to less than 2000 mg daily, and the importance of daily weights.   - Will schedule for CPX.  3. COPD with ongoing tobacco abuse:  - On ABX for acute bronchitis - Encouraged complete cessation.  4. PAD with Intermittent claudication: Medical therapy.  - Moderate bilateral calf claudication worse on the left side due to known SFA disease. This has progressed over past few months   - Had peripheral vascular catheterization 03/02/18 with no significant aortoiliac disease. Diffuse 60-70% disease affecting the left proximal and mid SFA with short occlusion distally with reconstitution via collaterals from the profunda.  Three-vessel runoff below the knee. Risk factor management and smoking cessation.  - Encouraged complete tobacco cessation. 5. Bilateral carotid stenosis - Followed by Dr. Johnsie Cancel.  - No change to current plan.   6. H/o symptomatic bradycardia:  - Stable of BB. Do not plan to re-challenge.  - No change to current plan.   7. Hyperlipidemia with intolerance of multiple cholesterol meds: - Tolerating Crestor 5 mg three times/week and Zetia 5mg  two times weekly.  - Per PCP.  8. Depression/Anxiety:  - Stable. - Planning on  starting Wellbutrin for smoking cessation. Per PCP  Doing well overall despite bronchitis. Labs today.  Plan CPX.  RTC 2-3 months.   Shirley Friar, PA-C  04/18/18   Greater than 50% of the 25 minute visit was spent in counseling/coordination of care regarding disease state education, salt/fluid restriction, sliding scale diuretics, and medication compliance.

## 2018-04-18 NOTE — Patient Instructions (Addendum)
Routine lab work today. Will notify you of abnormal results, otherwise no news is good news!  No changes to medication at this time.  Will schedule you for Cardiopulmonary Exercise Test. This test is done at our [facility]. Please wear comfortable clothes and shoes for this test. Avoid heavy meal before the test (light snack/meal recommended). Avoid caffeine, alcohol, tobacco products 12 hrs before test. Please give 24 hr notice for cancellations/rescheduling: [phone].  Follow up 2-3 months with Dr. Haroldine Laws.  Take all medication as prescribed the day of your appointment. Bring all medications with you to your appointment.  Do the following things EVERYDAY: 1) Weigh yourself in the morning before breakfast. Write it down and keep it in a log. 2) Take your medicines as prescribed 3) Eat low salt foods-Limit salt (sodium) to 2000 mg per day.  4) Stay as active as you can everyday 5) Limit all fluids for the day to less than 2 liters

## 2018-04-19 ENCOUNTER — Ambulatory Visit
Admission: RE | Admit: 2018-04-19 | Discharge: 2018-04-19 | Disposition: A | Discharge status: Home / Self Care | Payer: Medicare Other | Source: Ambulatory Visit | Attending: Family Medicine | Admitting: Family Medicine

## 2018-04-19 DIAGNOSIS — F17200 Nicotine dependence, unspecified, uncomplicated: Secondary | ICD-10-CM

## 2018-04-29 ENCOUNTER — Other Ambulatory Visit: Payer: Self-pay | Admitting: Cardiovascular Disease

## 2018-04-29 MED ORDER — ROSUVASTATIN CALCIUM 5 MG PO TABS
ORAL_TABLET | ORAL | 3 refills | Status: DC
Start: 2018-04-29 — End: 2019-05-30

## 2018-05-04 ENCOUNTER — Ambulatory Visit (INDEPENDENT_AMBULATORY_CARE_PROVIDER_SITE_OTHER): Payer: Medicare Other | Admitting: *Deleted

## 2018-05-04 DIAGNOSIS — Z95810 Presence of automatic (implantable) cardiac defibrillator: Secondary | ICD-10-CM | POA: Diagnosis not present

## 2018-05-04 DIAGNOSIS — I5022 Chronic systolic (congestive) heart failure: Secondary | ICD-10-CM

## 2018-05-04 DIAGNOSIS — I255 Ischemic cardiomyopathy: Secondary | ICD-10-CM | POA: Diagnosis not present

## 2018-05-04 NOTE — Progress Notes (Signed)
Remote ICD transmission.   

## 2018-05-05 ENCOUNTER — Other Ambulatory Visit (HOSPITAL_COMMUNITY): Payer: Self-pay | Admitting: Internal Medicine

## 2018-05-06 LAB — CUP PACEART REMOTE DEVICE CHECK
Battery Voltage: 2.74 V
Brady Statistic RV Percent Paced: 0 %
HIGH POWER IMPEDANCE MEASURED VALUE: 62 Ohm
Implantable Lead Implant Date: 20100212
Implantable Lead Location: 753860
Implantable Pulse Generator Implant Date: 20100212
Lead Channel Sensing Intrinsic Amplitude: 9.211 mV
Lead Channel Setting Sensing Sensitivity: 0.3 mV
MDC IDC MSMT LEADCHNL RV IMPEDANCE VALUE: 488 Ohm
MDC IDC SESS DTM: 20190529112406
MDC IDC SET LEADCHNL RV PACING AMPLITUDE: 2.5 V
MDC IDC SET LEADCHNL RV PACING PULSEWIDTH: 0.4 ms

## 2018-05-06 NOTE — Progress Notes (Signed)
EPIC Encounter for ICM Monitoring  Patient Name: [name] is a 67 y.o. female Date: 05/06/2018 Primary Care Physican: [place] Cardiologist: Nishan/Bensimhon Electrophysiologist: Allred Dry Weight: 124lbs(weighs daily)       Heart Failure questions reviewed, pt symptomatic with swelling in feet and legs, no energy and Berna Gitto of breath times when walking.    Thoracic impedance continues to be abnormal but appears Optivol may be inaccurate due to ReDs vest was 29% on 04/18/2018 at HF Clinic visit which is within normal limits.       Prescribed dosage: Furosemide 20 mg 2 tablets (40 mg total) by mouth daily. May take additional 20 mg tablet as needed.  She has taken total of 7 extra Furosemide 20 mg tablets since first of May.   Labs: 03/21/2018 Creatinine 1.11, BUN 10, Potassium 4.3, Sodium 137, EGFR 49-57  BNP 726.9 02/22/2018 Creatinine0.92, BUN13, Potassium4.6, [id number], KGOV70-34 02/09/2018 Creatinine1.02, BUN12, Potassium4.2, Sodium135, EGFR55->60  01/17/2018 Creatinine1.32, BUN32, Potassium4.9, Sodium132, EGFR40-47  01/16/2018 Creatinine1.54, BUN35, Potassium3.9, Sodium134, KBTC48-18  01/15/2018 Creatinine1.27, BUN23, Potassium4.1, Sodium134, HTMB31-12  01/14/2018 Creatinine1.9, BUN17, Potassium4.1, TKKOEC950, HKUV75-05 11/26/2017 Creatinine 1.08, BUN 16, Potassium 4.6, Sodium 134, EGFR 52-60 07/30/2017 Creatinine 1.24, BUN 14, Potassium 4.6, Sodium 136, EGFR 44-51 03/26/2017 Creatinine 1.08, BUN 18, Potassium 4.0, Sodium 138, EGFR 52-60  Recommendations: Advised to call if symptoms worsen and not resolved by extra Furosemide or if there is an increase in frequency in taking extra Lasix tablets.  Encouraged to call for fluid symptoms.  Follow-up plan: ICM clinic phone appointment on 06/07/2018.    Copy of ICM check sent to Dr. Haroldine Laws and Dr. Rayann Heman.   3 month ICM trend: 05/05/2018    1 Year  ICM trend:       Rosalene Billings, RN 05/06/2018 1:11 PM

## 2018-05-09 ENCOUNTER — Encounter (HOSPITAL_COMMUNITY): Payer: Medicare Other

## 2018-05-09 NOTE — Progress Notes (Signed)
Cardiology Office Note   Date:  05/10/2018   ID:  [name], DOB Oct 24, 1951, MRN [id number]  PCP:  [facility]  Cardiologist:   Jenkins Rouge, MD   No chief complaint on file.     History of Present Illness: [name] is a 67 y.o. female who presents for CHF f/u. She has been mostly seen by CHF clinic and Dr Rayann Heman recently. Ischemic DCM with history of anterior MI with PCI LAD. Last cath 07/26/14 with 50-60% mid RCA Last echo reviewed EF 20-25% moderate MR Severe LAE and estimated PA 40 mmHg Single chamber AICD placed in 2010 Optivol always seems high and not correlating with volume status.   Still smoking and has PVD followed by Dr Fletcher Anon ABI .96 on right and .69 on left Angio on March 2019 showed 60-70% left proximal SFA with distal occlusion with collaterals and 3 vessel run off Not a candidate for cilostazol due to CHF   Also with 01-77% LICA stenosis by duplex 08/10/17  Cat bite RLE on Augmentin   Better from flu and off prednisone so CHF improved   Past Medical History:  Diagnosis Date  . Arthritis    hands  . Asthma   . CAD (coronary artery disease)    a. s/p prior Ant MI, b. s/p prior POBA to LAD, Dx, RCA,;  c.  LHC (10/12):  dLAD 40, pCFX 30, OM1 50, pRCA 30;  d.  [facility] Myoview (11/14):  High risk study; inferior, anterior, apical defects; minimal reversibility toward the apex; consistent with prior infarct and very mild peri-infarct ischemia, EF 24%, inferior/apical/anterior akinesis  . Chronic systolic heart failure ([place])   . Depression   . H/O hiatal hernia   . HLD (hyperlipidemia)   . HTN (hypertension)   . Ischemic cardiomyopathy    MRI (12/09):  High scar burden, Inf and Apical AK, EF 24%.;  Echo (10/14): EF 15%, Gr 2 DD, mild to mod MR, mod LAE, RVSF mildly reduced, mod RAE, severe TR, PASP 49-53 (mod pulmonary HTN)  . MI (myocardial infarction) ([place])   . Tobacco abuse     Past Surgical History:  Procedure  Laterality Date  . ABDOMINAL AORTOGRAM W/LOWER EXTREMITY N/A 03/02/2018   Procedure: ABDOMINAL AORTOGRAM W/LOWER EXTREMITY;  Surgeon: Wellington Hampshire, MD;  Location: [facility];  Service: Cardiovascular;  Laterality: N/A;  . CARDIAC DEFIBRILLATOR PLACEMENT  01/18/2009   MDT single chamber ICD implanted by Dr Rayann Heman   . cardiac stents    . COLONOSCOPY  03/18/2012   Procedure: COLONOSCOPY;  Surgeon: Beryle Beams, MD;  Location: [facility];  Service: Endoscopy;  Laterality: N/A;  . CORONARY ANGIOPLASTY    . ESOPHAGOGASTRODUODENOSCOPY N/A 03/10/2013   Procedure: ESOPHAGOGASTRODUODENOSCOPY (EGD);  Surgeon: Beryle Beams, MD;  Location: [facility];  Service: Endoscopy;  Laterality: N/A;  . LEFT HEART CATHETERIZATION WITH CORONARY ANGIOGRAM N/A 07/26/2014   Procedure: LEFT HEART CATHETERIZATION WITH CORONARY ANGIOGRAM;  Surgeon: Jolaine Artist, MD;  Location: [facility];  Service: Cardiovascular;  Laterality: N/A;     Current Outpatient Medications  Medication Sig Dispense Refill  . acetaminophen (TYLENOL ARTHRITIS PAIN) 650 MG CR tablet Take 650 mg by mouth every 8 (eight) hours as needed for pain.    Marland Kitchen albuterol (PROAIR HFA) 108 (90 Base) MCG/ACT inhaler Inhale 2 puffs into the lungs every 6 (six) hours as needed for wheezing.    Marland Kitchen amoxicillin-clavulanate (AUGMENTIN) 875-125 MG tablet Take  by mouth as directed.    Marland Kitchen aspirin 81 MG tablet Take 81 mg by mouth daily.      Marland Kitchen azelastine (ASTELIN) 0.1 % nasal spray Place 1 spray into both nostrils daily. Use in each nostril as directed     . budesonide-formoterol (SYMBICORT) 160-4.5 MCG/ACT inhaler Inhale 2 puffs into the lungs 2 (two) times daily.    . citalopram (CELEXA) 20 MG tablet Take 20 mg by mouth daily.    . clonazePAM (KLONOPIN) 0.5 MG tablet Take 0.25-0.5 mg by mouth 2 (two) times daily as needed for anxiety.    . Cyanocobalamin ([employer] B12 PO) Take 1 capsule by mouth as directed.    . ezetimibe (ZETIA) 10 MG tablet TAKE 1/2  TABLET TWICE A WEEK 5 tablet 11  . fexofenadine (ALLEGRA) 180 MG tablet Take 180 mg by mouth daily as needed for allergies.     . fish oil-omega-3 fatty acids 1000 MG capsule Take 2 g by mouth daily.     . furosemide (LASIX) 20 MG tablet TAKE 2 TABLETS BY MOUTH DAILY. MAY TAKE ADDITIONAL 2 TABS AS NEEDED FOR SWELLING/ SHORT OF BREATH 120 tablet 3  . IRON PO Take 1 capsule by mouth as directed.    . isosorbide mononitrate (IMDUR) 30 MG 24 hr tablet Take 30 mg by mouth daily.    . nitroGLYCERIN (NITROSTAT) 0.4 MG SL tablet Place 1 tablet (0.4 mg total) under the tongue every 5 (five) minutes as needed for chest pain. 25 tablet 3  . omeprazole (PRILOSEC) 40 MG capsule Take 40 mg by mouth daily.     . polyethylene glycol (MIRALAX / GLYCOLAX) packet Take 17 g by mouth at bedtime.    . rosuvastatin (CRESTOR) 5 MG tablet Take 1 tablet by mouth on Monday, Wednesday, and Friday 45 tablet 3  . sacubitril-valsartan (ENTRESTO) 49-51 MG Take 1 tablet by mouth 2 (two) times daily. 180 tablet 3  . spironolactone (ALDACTONE) 25 MG tablet TAKE 1 TABLET BY MOUTH EVERY DAY 30 tablet 1   No current facility-administered medications for this visit.     Allergies:   Prednisone; Zebeta [bisoprolol fumarate]; and Statins    Social History:  The patient  reports that she has been smoking cigarettes.  She has never used smokeless tobacco. She reports that she does not drink alcohol or use drugs.   Family History:  The patient's family history is not on file.    ROS:  Please see the history of present illness.   Otherwise, review of systems are positive for none.   All other systems are reviewed and negative.    PHYSICAL EXAM: VS:  BP (!) 118/58   Pulse 100   Ht 5' 3.5" (1.613 m)   Wt 129 lb 4 oz (58.6 kg)   SpO2 98%   BMI 22.54 kg/m  , BMI Body mass index is 22.54 kg/m. Affect appropriate Chronically ill white female  HEENT: normal Neck supple with no adenopathy JVP normal left  bruits no  thyromegaly Lungs clear with no wheezing and good diaphragmatic motion Heart:  S1/S2 no murmur, no rub, gallop or click PMI normal Abdomen: benighn, BS positve, no tenderness, no AAA no bruit.  No HSM or HJR Decreased pulses below knee on left  No edema Neuro non-focal Erythema cat bite over right lower shin  No muscular weakness    EKG:  SR LBBB 01/16/18    Recent Labs: 02/22/2018: Hemoglobin 11.0; Platelets 180 03/21/2018: B Natriuretic Peptide 726.9 04/18/2018:  BUN 21; Creatinine, Ser 1.28; Magnesium 1.8; Potassium 4.0; Sodium 136    Lipid Panel    Component Value Date/Time   CHOL 165 08/04/2016 1033   TRIG 117 08/04/2016 1033   HDL 65 08/04/2016 1033   CHOLHDL 2.5 08/04/2016 1033   VLDL 23 08/04/2016 1033   LDLCALC 77 08/04/2016 1033   LDLDIRECT 134.7 12/13/2013 1043      Wt Readings from Last 3 Encounters:  05/10/18 129 lb 4 oz (58.6 kg)  04/18/18 127 lb 6.4 oz (57.8 kg)  03/22/18 131 lb (59.4 kg)      Other studies Reviewed: Additional studies/ records that were reviewed today include: Cath 2015 notes from EP and CHF clinics ABI's TTE carotid duplex labs and ECGls .    ASSESSMENT AND PLAN:  1.  CAD:  Stable no agnina continue medical Rx 2. Ischemic DCM:  Continue medical Rx f/u CHF clinic  3. Smoking discussed importance of cessation refractory  4. PVD:  Stable moderate claudication LLE f/u Arida duplex carotids  September 2019 ASA  5. COPD:  Continue inhalers Lung cancer screening CT ok 04/19/18 6. HLD:  Continue statin  7. AICD:  No d/c f/u with Dr Rayann Heman  8. Cat Bite: continue Augmentin f/u primary    Current medicines are reviewed at length with the patient today.  The patient does not have concerns regarding medicines.  The following changes have been made:  no change  Labs/ tests ordered today include: Carotid September 2019 No orders of the defined types were placed in this encounter.    Disposition:   FU with CHF clinic 3 months F/U EP  for AICD and Arida for PV Can f/u with me in a year      Signed, Jenkins Rouge, MD  05/10/2018 11:07 AM    [address], [place], [place]  [address] Phone: [phone]; Fax: [phone]

## 2018-05-10 ENCOUNTER — Encounter: Payer: Self-pay | Admitting: Cardiovascular Disease

## 2018-05-10 ENCOUNTER — Ambulatory Visit (INDEPENDENT_AMBULATORY_CARE_PROVIDER_SITE_OTHER): Payer: Medicare Other | Admitting: Cardiovascular Disease

## 2018-05-10 VITALS — BP 118/58 | HR 100 | Ht 63.5 in | Wt 129.2 lb

## 2018-05-10 DIAGNOSIS — I10 Essential (primary) hypertension: Secondary | ICD-10-CM | POA: Diagnosis not present

## 2018-05-10 DIAGNOSIS — I259 Chronic ischemic heart disease, unspecified: Secondary | ICD-10-CM | POA: Diagnosis not present

## 2018-05-10 DIAGNOSIS — I779 Disorder of arteries and arterioles, unspecified: Secondary | ICD-10-CM | POA: Diagnosis not present

## 2018-05-10 DIAGNOSIS — I739 Peripheral vascular disease, unspecified: Secondary | ICD-10-CM

## 2018-05-10 DIAGNOSIS — I255 Ischemic cardiomyopathy: Secondary | ICD-10-CM | POA: Diagnosis not present

## 2018-05-10 NOTE — Patient Instructions (Addendum)
Medication Instructions:  Your physician recommends that you continue on your current medications as directed. Please refer to the Current Medication list given to you today.  Labwork: NONE  Testing/Procedures: Your physician has requested that you have a carotid duplex in September. This test is an ultrasound of the carotid arteries in your neck. It looks at blood flow through these arteries that supply the brain with blood. Allow one hour for this exam. There are no restrictions or special instructions.  Follow-Up: Your physician wants you to follow-up in: 6 months with Dr. Johnsie Cancel. You will receive a reminder letter in the mail two months in advance. If you don't receive a letter, please call our office to schedule the follow-up appointment.   If you need a refill on your cardiac medications before your next appointment, please call your pharmacy.

## 2018-05-17 ENCOUNTER — Other Ambulatory Visit: Payer: Self-pay | Admitting: Cardiovascular Disease

## 2018-05-17 DIAGNOSIS — I6523 Occlusion and stenosis of bilateral carotid arteries: Secondary | ICD-10-CM

## 2018-05-24 ENCOUNTER — Encounter (HOSPITAL_COMMUNITY): Payer: Medicare Other

## 2018-05-29 ENCOUNTER — Other Ambulatory Visit (HOSPITAL_COMMUNITY): Payer: Self-pay | Admitting: Internal Medicine

## 2018-05-30 ENCOUNTER — Telehealth (HOSPITAL_COMMUNITY): Payer: Self-pay | Admitting: *Deleted

## 2018-05-30 NOTE — Telephone Encounter (Signed)
Advanced Heart Failure Triage Encounter  Patient Name: [name]  Date of Call: 05/30/18  Problem:  Patient called and left message on triage line stating she has been retaining fluid for the past week.   Plan:  I called patient to discuss in further detail but had to leave message asking for her to call us back.   Darron Doom, RN

## 2018-06-01 ENCOUNTER — Telehealth (HOSPITAL_COMMUNITY): Payer: Self-pay | Admitting: *Deleted

## 2018-06-01 MED ORDER — METOLAZONE 2.5 MG PO TABS: 2.5000 mg | ORAL_TABLET | ORAL | 0 refills | Status: DC | PRN

## 2018-06-01 NOTE — Telephone Encounter (Signed)
Have her continue to take lasix 40 mg BID for 2 days. Please call in 2.5 mg metolazone to take once today. On Monday, please schedule for BMET, BNP, and vest reading. Thanks

## 2018-06-01 NOTE — Telephone Encounter (Signed)
Patient is agreeable with plan.  Metolazone sent to pharmacy and nurse visit scheduled prior to patient's CPX for bmet, bnp, and vest reading. No further questions.

## 2018-06-01 NOTE — Telephone Encounter (Signed)
Advanced Heart Failure Triage Encounter  Patient Name: [name]  Date of Call: 06/01/18  Problem:  Patient called complaining of increased swelling in lower extremities and abdomen.  She has gained 7 lbs in the past week.  She was 124 last Wednesday and today she is 131 lbs.  She has taken lasix 40 mg BID for the past three days with no relief.    Plan:  Will forward to Lillia Mountain, RN to review and will call patient back.   Darron Doom, RN

## 2018-06-03 ENCOUNTER — Encounter (HOSPITAL_COMMUNITY): Payer: Self-pay | Admitting: Emergency Medicine

## 2018-06-03 ENCOUNTER — Inpatient Hospital Stay (HOSPITAL_COMMUNITY)
Admission: EM | Admit: 2018-06-03 | Discharge: 2018-06-08 | DRG: 291 | Disposition: A | Discharge status: Home / Self Care | Payer: Medicare Other | Attending: Cardiology | Admitting: Cardiology

## 2018-06-03 ENCOUNTER — Other Ambulatory Visit: Payer: Self-pay

## 2018-06-03 DIAGNOSIS — I255 Ischemic cardiomyopathy: Secondary | ICD-10-CM | POA: Diagnosis present

## 2018-06-03 DIAGNOSIS — I252 Old myocardial infarction: Secondary | ICD-10-CM

## 2018-06-03 DIAGNOSIS — I429 Cardiomyopathy, unspecified: Secondary | ICD-10-CM

## 2018-06-03 DIAGNOSIS — I10 Essential (primary) hypertension: Secondary | ICD-10-CM | POA: Diagnosis present

## 2018-06-03 DIAGNOSIS — I447 Left bundle-branch block, unspecified: Secondary | ICD-10-CM | POA: Diagnosis present

## 2018-06-03 DIAGNOSIS — F17210 Nicotine dependence, cigarettes, uncomplicated: Secondary | ICD-10-CM | POA: Diagnosis present

## 2018-06-03 DIAGNOSIS — N183 Chronic kidney disease, stage 3 (moderate): Secondary | ICD-10-CM | POA: Diagnosis present

## 2018-06-03 DIAGNOSIS — Z888 Allergy status to other drugs, medicaments and biological substances status: Secondary | ICD-10-CM

## 2018-06-03 DIAGNOSIS — I5023 Acute on chronic systolic (congestive) heart failure: Secondary | ICD-10-CM | POA: Diagnosis present

## 2018-06-03 DIAGNOSIS — I130 Hypertensive heart and chronic kidney disease with heart failure and stage 1 through stage 4 chronic kidney disease, or unspecified chronic kidney disease: Principal | ICD-10-CM | POA: Diagnosis present

## 2018-06-03 DIAGNOSIS — I2510 Atherosclerotic heart disease of native coronary artery without angina pectoris: Secondary | ICD-10-CM | POA: Diagnosis present

## 2018-06-03 DIAGNOSIS — Z95810 Presence of automatic (implantable) cardiac defibrillator: Secondary | ICD-10-CM

## 2018-06-03 DIAGNOSIS — J449 Chronic obstructive pulmonary disease, unspecified: Secondary | ICD-10-CM | POA: Diagnosis present

## 2018-06-03 DIAGNOSIS — Z7951 Long term (current) use of inhaled steroids: Secondary | ICD-10-CM

## 2018-06-03 DIAGNOSIS — I739 Peripheral vascular disease, unspecified: Secondary | ICD-10-CM | POA: Diagnosis present

## 2018-06-03 DIAGNOSIS — I5043 Acute on chronic combined systolic (congestive) and diastolic (congestive) heart failure: Secondary | ICD-10-CM

## 2018-06-03 DIAGNOSIS — I081 Rheumatic disorders of both mitral and tricuspid valves: Secondary | ICD-10-CM | POA: Diagnosis present

## 2018-06-03 DIAGNOSIS — I509 Heart failure, unspecified: Secondary | ICD-10-CM

## 2018-06-03 DIAGNOSIS — N179 Acute kidney failure, unspecified: Secondary | ICD-10-CM | POA: Diagnosis present

## 2018-06-03 DIAGNOSIS — I5022 Chronic systolic (congestive) heart failure: Secondary | ICD-10-CM | POA: Diagnosis present

## 2018-06-03 DIAGNOSIS — Z955 Presence of coronary angioplasty implant and graft: Secondary | ICD-10-CM

## 2018-06-03 DIAGNOSIS — E871 Hypo-osmolality and hyponatremia: Secondary | ICD-10-CM

## 2018-06-03 DIAGNOSIS — Z91012 Allergy to eggs: Secondary | ICD-10-CM

## 2018-06-03 DIAGNOSIS — E785 Hyperlipidemia, unspecified: Secondary | ICD-10-CM | POA: Diagnosis present

## 2018-06-03 DIAGNOSIS — Z7982 Long term (current) use of aspirin: Secondary | ICD-10-CM

## 2018-06-03 LAB — URINALYSIS, ROUTINE W REFLEX MICROSCOPIC
Bilirubin Urine: NEGATIVE
GLUCOSE, UA: NEGATIVE mg/dL
Hgb urine dipstick: NEGATIVE
KETONES UR: NEGATIVE mg/dL
LEUKOCYTES UA: NEGATIVE
Nitrite: NEGATIVE
PROTEIN: NEGATIVE mg/dL
Specific Gravity, Urine: 1.014 (ref 1.005–1.030)
pH: 5 (ref 5.0–8.0)

## 2018-06-03 LAB — COMPREHENSIVE METABOLIC PANEL
ALK PHOS: 90 U/L (ref 38–126)
ALT: 49 U/L — AB (ref 0–44)
AST: 37 U/L (ref 15–41)
Albumin: 3.2 g/dL — ABNORMAL LOW (ref 3.5–5.0)
Anion gap: 11 (ref 5–15)
BILIRUBIN TOTAL: 1 mg/dL (ref 0.3–1.2)
BUN: 22 mg/dL (ref 8–23)
CALCIUM: 9.2 mg/dL (ref 8.9–10.3)
CO2: 21 mmol/L — ABNORMAL LOW (ref 22–32)
CREATININE: 2.04 mg/dL — AB (ref 0.44–1.00)
Chloride: 93 mmol/L — ABNORMAL LOW (ref 98–111)
GFR, EST AFRICAN AMERICAN: 27 mL/min — AB (ref >60)
GFR, EST NON AFRICAN AMERICAN: 24 mL/min — AB (ref >60)
Glucose, Bld: 116 mg/dL — ABNORMAL HIGH (ref 70–99)
Potassium: 4.4 mmol/L (ref 3.5–5.1)
Sodium: 125 mmol/L — ABNORMAL LOW (ref 135–145)
TOTAL PROTEIN: 6.1 g/dL — AB (ref 6.5–8.1)

## 2018-06-03 LAB — CBC
HCT: 34.7 % — ABNORMAL LOW (ref 36.0–46.0)
Hemoglobin: 11 g/dL — ABNORMAL LOW (ref 12.0–15.0)
MCH: 29.6 pg (ref 26.0–34.0)
MCHC: 31.7 g/dL (ref 30.0–36.0)
MCV: 93.3 fL (ref 78.0–100.0)
PLATELETS: 138 10*3/uL — AB (ref 150–400)
RBC: 3.72 MIL/uL — AB (ref 3.87–5.11)
RDW: 16.8 % — ABNORMAL HIGH (ref 11.5–15.5)
WBC: 7.4 10*3/uL (ref 4.0–10.5)

## 2018-06-03 LAB — LIPASE, BLOOD: LIPASE: 27 U/L (ref 11–51)

## 2018-06-03 NOTE — ED Triage Notes (Signed)
Pt reports bilateral leg swelling x2 weeks. L leg is worse than the R. Also reports abd pain and nausea x2 weeks, worse in the last two or three days. Only able to tolerate brat diet - applesauce and saltines. Reports loose stools - LBM today.

## 2018-06-04 ENCOUNTER — Emergency Department (HOSPITAL_COMMUNITY): Payer: Medicare Other

## 2018-06-04 ENCOUNTER — Other Ambulatory Visit: Payer: Self-pay

## 2018-06-04 DIAGNOSIS — Z7982 Long term (current) use of aspirin: Secondary | ICD-10-CM | POA: Diagnosis not present

## 2018-06-04 DIAGNOSIS — I739 Peripheral vascular disease, unspecified: Secondary | ICD-10-CM | POA: Diagnosis present

## 2018-06-04 DIAGNOSIS — N183 Chronic kidney disease, stage 3 (moderate): Secondary | ICD-10-CM

## 2018-06-04 DIAGNOSIS — I447 Left bundle-branch block, unspecified: Secondary | ICD-10-CM | POA: Diagnosis present

## 2018-06-04 DIAGNOSIS — E871 Hypo-osmolality and hyponatremia: Secondary | ICD-10-CM | POA: Diagnosis not present

## 2018-06-04 DIAGNOSIS — I5043 Acute on chronic combined systolic (congestive) and diastolic (congestive) heart failure: Secondary | ICD-10-CM | POA: Diagnosis not present

## 2018-06-04 DIAGNOSIS — I130 Hypertensive heart and chronic kidney disease with heart failure and stage 1 through stage 4 chronic kidney disease, or unspecified chronic kidney disease: Secondary | ICD-10-CM | POA: Diagnosis present

## 2018-06-04 DIAGNOSIS — I081 Rheumatic disorders of both mitral and tricuspid valves: Secondary | ICD-10-CM | POA: Diagnosis present

## 2018-06-04 DIAGNOSIS — E785 Hyperlipidemia, unspecified: Secondary | ICD-10-CM | POA: Diagnosis present

## 2018-06-04 DIAGNOSIS — Z91012 Allergy to eggs: Secondary | ICD-10-CM | POA: Diagnosis not present

## 2018-06-04 DIAGNOSIS — I5021 Acute systolic (congestive) heart failure: Secondary | ICD-10-CM

## 2018-06-04 DIAGNOSIS — I255 Ischemic cardiomyopathy: Secondary | ICD-10-CM | POA: Diagnosis present

## 2018-06-04 DIAGNOSIS — I252 Old myocardial infarction: Secondary | ICD-10-CM | POA: Diagnosis not present

## 2018-06-04 DIAGNOSIS — I340 Nonrheumatic mitral (valve) insufficiency: Secondary | ICD-10-CM | POA: Diagnosis not present

## 2018-06-04 DIAGNOSIS — N179 Acute kidney failure, unspecified: Secondary | ICD-10-CM | POA: Diagnosis not present

## 2018-06-04 DIAGNOSIS — R0603 Acute respiratory distress: Secondary | ICD-10-CM | POA: Diagnosis not present

## 2018-06-04 DIAGNOSIS — Z95810 Presence of automatic (implantable) cardiac defibrillator: Secondary | ICD-10-CM | POA: Diagnosis not present

## 2018-06-04 DIAGNOSIS — I361 Nonrheumatic tricuspid (valve) insufficiency: Secondary | ICD-10-CM | POA: Diagnosis not present

## 2018-06-04 DIAGNOSIS — I2510 Atherosclerotic heart disease of native coronary artery without angina pectoris: Secondary | ICD-10-CM | POA: Diagnosis present

## 2018-06-04 DIAGNOSIS — Z955 Presence of coronary angioplasty implant and graft: Secondary | ICD-10-CM | POA: Diagnosis not present

## 2018-06-04 DIAGNOSIS — F17210 Nicotine dependence, cigarettes, uncomplicated: Secondary | ICD-10-CM | POA: Diagnosis present

## 2018-06-04 DIAGNOSIS — Z7951 Long term (current) use of inhaled steroids: Secondary | ICD-10-CM | POA: Diagnosis not present

## 2018-06-04 DIAGNOSIS — J449 Chronic obstructive pulmonary disease, unspecified: Secondary | ICD-10-CM | POA: Diagnosis present

## 2018-06-04 DIAGNOSIS — Z888 Allergy status to other drugs, medicaments and biological substances status: Secondary | ICD-10-CM | POA: Diagnosis not present

## 2018-06-04 DIAGNOSIS — I5023 Acute on chronic systolic (congestive) heart failure: Secondary | ICD-10-CM | POA: Diagnosis not present

## 2018-06-04 DIAGNOSIS — I509 Heart failure, unspecified: Secondary | ICD-10-CM

## 2018-06-04 LAB — CBC WITH DIFFERENTIAL/PLATELET
Abs Immature Granulocytes: 0 10*3/uL (ref 0.0–0.1)
BASOS PCT: 1 %
Basophils Absolute: 0 10*3/uL (ref 0.0–0.1)
EOS ABS: 0 10*3/uL (ref 0.0–0.7)
Eosinophils Relative: 1 %
HCT: 33.8 % — ABNORMAL LOW (ref 36.0–46.0)
Hemoglobin: 10.7 g/dL — ABNORMAL LOW (ref 12.0–15.0)
Immature Granulocytes: 0 %
Lymphocytes Relative: 15 %
Lymphs Abs: 0.9 10*3/uL (ref 0.7–4.0)
MCH: 29.7 pg (ref 26.0–34.0)
MCHC: 31.7 g/dL (ref 30.0–36.0)
MCV: 93.9 fL (ref 78.0–100.0)
MONOS PCT: 11 %
Monocytes Absolute: 0.6 10*3/uL (ref 0.1–1.0)
Neutro Abs: 4 10*3/uL (ref 1.7–7.7)
Neutrophils Relative %: 72 %
PLATELETS: 126 10*3/uL — AB (ref 150–400)
RBC: 3.6 MIL/uL — ABNORMAL LOW (ref 3.87–5.11)
RDW: 16.9 % — AB (ref 11.5–15.5)
WBC: 5.6 10*3/uL (ref 4.0–10.5)

## 2018-06-04 LAB — COMPREHENSIVE METABOLIC PANEL
ALT: 81 U/L — ABNORMAL HIGH (ref 0–44)
AST: 77 U/L — ABNORMAL HIGH (ref 15–41)
Albumin: 2.9 g/dL — ABNORMAL LOW (ref 3.5–5.0)
Alkaline Phosphatase: 81 U/L (ref 38–126)
Anion gap: 11 (ref 5–15)
BUN: 22 mg/dL (ref 8–23)
CHLORIDE: 94 mmol/L — AB (ref 98–111)
CO2: 20 mmol/L — ABNORMAL LOW (ref 22–32)
CREATININE: 1.85 mg/dL — AB (ref 0.44–1.00)
Calcium: 8.8 mg/dL — ABNORMAL LOW (ref 8.9–10.3)
GFR, EST AFRICAN AMERICAN: 31 mL/min — AB (ref >60)
GFR, EST NON AFRICAN AMERICAN: 27 mL/min — AB (ref >60)
Glucose, Bld: 88 mg/dL (ref 70–99)
POTASSIUM: 4.6 mmol/L (ref 3.5–5.1)
Sodium: 125 mmol/L — ABNORMAL LOW (ref 135–145)
Total Bilirubin: 1 mg/dL (ref 0.3–1.2)
Total Protein: 5.6 g/dL — ABNORMAL LOW (ref 6.5–8.1)

## 2018-06-04 LAB — TROPONIN I

## 2018-06-04 LAB — HIV ANTIBODY (ROUTINE TESTING W REFLEX): HIV SCREEN 4TH GENERATION: NONREACTIVE

## 2018-06-04 LAB — MAGNESIUM: Magnesium: 1.9 mg/dL (ref 1.7–2.4)

## 2018-06-04 LAB — MRSA PCR SCREENING: MRSA by PCR: NEGATIVE

## 2018-06-04 LAB — BRAIN NATRIURETIC PEPTIDE: B Natriuretic Peptide: 3135.7 pg/mL — ABNORMAL HIGH (ref 0.0–100.0)

## 2018-06-04 MED ORDER — EZETIMIBE 10 MG PO TABS
10.0000 mg | ORAL_TABLET | Freq: Every day | ORAL | Status: DC
  Administered 2018-06-04 – 2018-06-06 (×3): 10 mg via ORAL
  Filled 2018-06-04 (×5): qty 1

## 2018-06-04 MED ORDER — CLONAZEPAM 0.25 MG PO TBDP: 0.2500 mg | ORAL_TABLET | Freq: Two times a day (BID) | ORAL | Status: DC | PRN

## 2018-06-04 MED ORDER — ASPIRIN 81 MG PO CHEW
81.0000 mg | CHEWABLE_TABLET | Freq: Every day | ORAL | Status: DC
  Administered 2018-06-04 – 2018-06-08 (×5): 81 mg via ORAL
  Filled 2018-06-04 (×5): qty 1

## 2018-06-04 MED ORDER — LEVETIRACETAM IN NACL 1000 MG/100ML IV SOLN
1000.0000 mg | Freq: Once | INTRAVENOUS | Status: AC
Start: 2018-06-04 — End: 2018-06-04
  Administered 2018-06-04: 1000 mg via INTRAVENOUS
  Filled 2018-06-04: qty 100

## 2018-06-04 MED ORDER — ALBUTEROL SULFATE HFA 108 (90 BASE) MCG/ACT IN AERS
2.0000 | INHALATION_SPRAY | Freq: Four times a day (QID) | RESPIRATORY_TRACT | Status: DC | PRN
  Filled 2018-06-04: qty 6.7

## 2018-06-04 MED ORDER — PANTOPRAZOLE SODIUM 40 MG PO TBEC
40.0000 mg | DELAYED_RELEASE_TABLET | Freq: Every day | ORAL | Status: DC
  Administered 2018-06-04 – 2018-06-08 (×5): 40 mg via ORAL
  Filled 2018-06-04 (×6): qty 1

## 2018-06-04 MED ORDER — POLYETHYLENE GLYCOL 3350 17 G PO PACK
17.0000 g | PACK | Freq: Every day | ORAL | Status: DC
  Administered 2018-06-04 – 2018-06-07 (×4): 17 g via ORAL
  Filled 2018-06-04 (×4): qty 1

## 2018-06-04 MED ORDER — SODIUM CHLORIDE 0.9% FLUSH
3.0000 mL | INTRAVENOUS | Status: DC | PRN
Start: 2018-06-04 — End: 2018-06-08

## 2018-06-04 MED ORDER — ACETAMINOPHEN 325 MG PO TABS
650.0000 mg | ORAL_TABLET | Freq: Three times a day (TID) | ORAL | Status: DC | PRN
  Administered 2018-06-06 – 2018-06-08 (×2): 650 mg via ORAL
  Filled 2018-06-04 (×2): qty 2

## 2018-06-04 MED ORDER — FLUTICASONE FUROATE-VILANTEROL 200-25 MCG/INH IN AEPB
1.0000 | INHALATION_SPRAY | Freq: Every day | RESPIRATORY_TRACT | Status: DC
  Administered 2018-06-05 – 2018-06-08 (×4): 1 via RESPIRATORY_TRACT
  Filled 2018-06-04: qty 28

## 2018-06-04 MED ORDER — FUROSEMIDE 10 MG/ML IJ SOLN
80.0000 mg | Freq: Once | INTRAMUSCULAR | Status: AC
  Administered 2018-06-04: 80 mg via INTRAVENOUS
  Filled 2018-06-04: qty 8

## 2018-06-04 MED ORDER — AZELASTINE HCL 0.1 % NA SOLN
1.0000 | Freq: Every day | NASAL | Status: DC
  Administered 2018-06-04 – 2018-06-08 (×5): 1 via NASAL
  Filled 2018-06-04: qty 30

## 2018-06-04 MED ORDER — ALBUTEROL SULFATE (2.5 MG/3ML) 0.083% IN NEBU
2.5000 mg | INHALATION_SOLUTION | Freq: Four times a day (QID) | RESPIRATORY_TRACT | Status: DC | PRN
  Administered 2018-06-05: 2.5 mg via RESPIRATORY_TRACT
  Filled 2018-06-04: qty 3

## 2018-06-04 MED ORDER — ONDANSETRON HCL 4 MG/2ML IJ SOLN
4.0000 mg | Freq: Once | INTRAMUSCULAR | Status: AC
  Administered 2018-06-04: 4 mg via INTRAVENOUS
  Filled 2018-06-04: qty 2

## 2018-06-04 MED ORDER — CITALOPRAM HYDROBROMIDE 20 MG PO TABS
20.0000 mg | ORAL_TABLET | Freq: Every day | ORAL | Status: DC
  Administered 2018-06-04 – 2018-06-08 (×5): 20 mg via ORAL
  Filled 2018-06-04 (×2): qty 1
  Filled 2018-06-04: qty 2
  Filled 2018-06-04 (×3): qty 1

## 2018-06-04 MED ORDER — ISOSORBIDE MONONITRATE ER 30 MG PO TB24
30.0000 mg | ORAL_TABLET | Freq: Every day | ORAL | Status: DC
  Administered 2018-06-04 – 2018-06-08 (×5): 30 mg via ORAL
  Filled 2018-06-04 (×5): qty 1

## 2018-06-04 MED ORDER — SODIUM CHLORIDE 0.9 % IV SOLN: 250.0000 mL | INTRAVENOUS | Status: DC | PRN

## 2018-06-04 MED ORDER — ONDANSETRON HCL 4 MG/2ML IJ SOLN
4.0000 mg | Freq: Four times a day (QID) | INTRAMUSCULAR | Status: DC | PRN
  Administered 2018-06-05: 4 mg via INTRAVENOUS
  Filled 2018-06-04: qty 2

## 2018-06-04 MED ORDER — HEPARIN SODIUM (PORCINE) 5000 UNIT/ML IJ SOLN
5000.0000 [IU] | Freq: Three times a day (TID) | INTRAMUSCULAR | Status: DC
  Administered 2018-06-04 – 2018-06-07 (×9): 5000 [IU] via SUBCUTANEOUS
  Filled 2018-06-04 (×9): qty 1

## 2018-06-04 MED ORDER — SODIUM CHLORIDE 0.9% FLUSH
3.0000 mL | Freq: Two times a day (BID) | INTRAVENOUS | Status: DC
  Administered 2018-06-04 – 2018-06-08 (×9): 3 mL via INTRAVENOUS

## 2018-06-04 MED ORDER — ROSUVASTATIN CALCIUM 5 MG PO TABS
5.0000 mg | ORAL_TABLET | Freq: Every day | ORAL | Status: DC
  Administered 2018-06-06 – 2018-06-08 (×2): 5 mg via ORAL
  Filled 2018-06-04 (×5): qty 1

## 2018-06-04 NOTE — Progress Notes (Signed)
Spoke w/ pharmacy re: Breo inhaler.  Per pharmacy, med will be dispensed once pt is admitted to floor.  

## 2018-06-04 NOTE — ED Provider Notes (Signed)
[facility] EMERGENCY DEPARTMENT Provider Note   CSN: [id number] Arrival date & time: 06/03/18  1941     History   Chief Complaint Chief Complaint  Patient presents with  . Abdominal Pain  . Leg Swelling    HPI ZYANA AMARO is a 67 y.o. female.  HPI 67 year old female with history of CHF comes in with chief complaint of shortness of breath, leg swelling and abdominal pain. Patient has history of CAD, ischemic cardiomyopathy with a EF of 20%.  She reports over the past 2 or 3 weeks she has been having exertional dyspnea.  Patient also has been noticing worsening pitting edema and increasing abdominal girth.  Patient's dry weight has gone up from 124 to 134 pounds.  Patient called cardiology team earlier in the week and she was started on Zaroxolyn in addition to her normal Lasix.  Patient states that despite taking more fluid pill she has not noted any significant improvement.  Patient denies any chest pain.  She has no cough and she denies any worsening orthopnea.   Past Medical History:  Diagnosis Date  . Arthritis    hands  . Asthma   . CAD (coronary artery disease)    a. s/p prior Ant MI, b. s/p prior POBA to LAD, Dx, RCA,;  c.  LHC (10/12):  dLAD 40, pCFX 30, OM1 50, pRCA 30;  d.  [facility] Myoview (11/14):  High risk study; inferior, anterior, apical defects; minimal reversibility toward the apex; consistent with prior infarct and very mild peri-infarct ischemia, EF 24%, inferior/apical/anterior akinesis  . Chronic systolic heart failure ([place])   . Depression   . H/O hiatal hernia   . HLD (hyperlipidemia)   . HTN (hypertension)   . Ischemic cardiomyopathy    MRI (12/09):  High scar burden, Inf and Apical AK, EF 24%.;  Echo (10/14): EF 15%, Gr 2 DD, mild to mod MR, mod LAE, RVSF mildly reduced, mod RAE, severe TR, PASP 49-53 (mod pulmonary HTN)  . MI (myocardial infarction) ([place])   . Tobacco abuse     Patient Active Problem List   Diagnosis Date  Noted  . Acute CHF (congestive heart failure) ([place]) 06/04/2018  . Acute systolic heart failure ([place]) 06/04/2018  . Dizziness 07/30/2017  . Generalized anxiety disorder 04/08/2016  . Tenosynovitis, de Quervain 04/08/2016  . CKD (chronic kidney disease) stage 3, GFR 30-59 ml/min (HCC) 03/31/2016  . Allergic rhinitis 02/06/2016  . Disease of nasal cavity and sinuses 02/06/2016  . Cardiomyopathy ([place]) 02/06/2016  . Asthma 02/06/2016  . Diaphragmatic hernia 02/06/2016  . Dyslipidemia 02/06/2016  . GERD (gastroesophageal reflux disease) 02/06/2016  . Primary snoring 02/06/2016  . CAD (coronary artery disease) 02/06/2016  . Intermittent claudication ([place]) 08/07/2014  . Bilateral carotid artery stenosis 08/07/2014  . Stenosis of carotid artery 08/07/2014  . Precordial chest pain 07/25/2014  . Chronic systolic heart failure ([place]) 10/10/2013  . Chronic obstructive pulmonary disease ([place]) 10/10/2013  . Tobacco use 10/10/2013  . Hyperlipidemia 06/22/2013  . Leg pain 06/05/2013  . Implantable cardioverter-defibrillator (ICD) in situ 09/17/2011  . Congestive heart failure ([place]) 08/14/2011  . Tobacco abuse 01/30/2011  . Other symptoms involving cardiovascular system 01/30/2011  . Depression 05/01/2009  . Essential hypertension 05/01/2009  . MYOCARDIAL INFARCTION 05/01/2009  . Coronary atherosclerosis 05/01/2009  . CARDIOMYOPATHY, ISCHEMIC 05/01/2009  . Chronic ischemic heart disease 05/01/2009    Past Surgical History:  Procedure Laterality Date  . ABDOMINAL AORTOGRAM W/LOWER EXTREMITY N/A 03/02/2018  Procedure: ABDOMINAL AORTOGRAM W/LOWER EXTREMITY;  Surgeon: Wellington Hampshire, MD;  Location: [facility];  Service: Cardiovascular;  Laterality: N/A;  . CARDIAC DEFIBRILLATOR PLACEMENT  01/18/2009   MDT single chamber ICD implanted by Dr Rayann Heman   . cardiac stents    . COLONOSCOPY  03/18/2012   Procedure: COLONOSCOPY;  Surgeon: Beryle Beams, MD;  Location: [facility];  Service:  Endoscopy;  Laterality: N/A;  . CORONARY ANGIOPLASTY    . ESOPHAGOGASTRODUODENOSCOPY N/A 03/10/2013   Procedure: ESOPHAGOGASTRODUODENOSCOPY (EGD);  Surgeon: Beryle Beams, MD;  Location: [facility];  Service: Endoscopy;  Laterality: N/A;  . LEFT HEART CATHETERIZATION WITH CORONARY ANGIOGRAM N/A 07/26/2014   Procedure: LEFT HEART CATHETERIZATION WITH CORONARY ANGIOGRAM;  Surgeon: Jolaine Artist, MD;  Location: [facility] CATH LAB;  Service: Cardiovascular;  Laterality: N/A;     OB History   None      Home Medications    Prior to Admission medications   Medication Sig Start Date End Date Taking? Authorizing Provider  acetaminophen (TYLENOL ARTHRITIS PAIN) 650 MG CR tablet Take 650 mg by mouth every 8 (eight) hours as needed for pain.   Yes [provider]  albuterol (PROAIR HFA) 108 (90 Base) MCG/ACT inhaler Inhale 2 puffs into the lungs every 6 (six) hours as needed for wheezing. 01/12/18  Yes [provider]  aspirin 81 MG tablet Take 81 mg by mouth daily.     Yes [provider]  azelastine (ASTELIN) 0.1 % nasal spray Place 1 spray into both nostrils daily.    Yes [provider]  budesonide-formoterol (SYMBICORT) 160-4.5 MCG/ACT inhaler Inhale 2 puffs into the lungs 2 (two) times daily. 10/14/17  Yes [provider]  citalopram (CELEXA) 20 MG tablet Take 20 mg by mouth daily. 12/16/17  Yes [provider]  clonazePAM (KLONOPIN) 0.5 MG tablet Take 0.25-0.5 mg by mouth 2 (two) times daily as needed for anxiety.   Yes [provider]  buPROPion (WELLBUTRIN XL) 150 MG 24 hr tablet Take 150 mg by mouth daily. 05/30/18   [provider]  Cyanocobalamin ([employer] B12 PO) Take 1 capsule by mouth as directed.    [provider]  ezetimibe (ZETIA) 10 MG tablet TAKE 1/2 TABLET TWICE A WEEK 07/02/17   Bensimhon, Shaune Pascal, MD  fexofenadine (ALLEGRA) 180 MG tablet Take 180 mg by mouth daily as needed for allergies.     [provider]  fish oil-omega-3 fatty acids 1000 MG capsule Take 2 g by mouth daily.     [provider]  furosemide (LASIX) 20 MG tablet TAKE 2 TABLETS BY MOUTH DAILY. MAY TAKE ADDITIONAL 2 TABS AS NEEDED FOR SWELLING/ SHORT OF BREATH 09/23/17   Bensimhon, Shaune Pascal, MD  IRON PO Take 1 capsule by mouth as directed.    [provider]  isosorbide mononitrate (IMDUR) 30 MG 24 hr tablet Take 30 mg by mouth daily.    [provider]  metolazone (ZAROXOLYN) 2.5 MG tablet Take 1 tablet (2.5 mg total) by mouth as needed (swelling/wt gain.). Only take when instructed by HF clinic. 06/01/18   Georgiana Shore, NP  nitroGLYCERIN (NITROSTAT) 0.4 MG SL tablet Place 1 tablet (0.4 mg total) under the tongue every 5 (five) minutes as needed for chest pain. 10/28/16   Josue Hector, MD  omeprazole (PRILOSEC) 40 MG capsule Take 40 mg by mouth daily.     [provider]  polyethylene glycol (MIRALAX / GLYCOLAX) packet Take 17  g by mouth at bedtime.    [provider]  rosuvastatin (CRESTOR) 5 MG tablet Take 1 tablet by mouth on Monday, Wednesday, and Friday 04/29/18   Wellington Hampshire, MD  sacubitril-valsartan (ENTRESTO) 49-51 MG Take 1 tablet by mouth 2 (two) times daily. 10/05/17   Bensimhon, Shaune Pascal, MD  spironolactone (ALDACTONE) 25 MG tablet TAKE 1 TABLET BY MOUTH EVERY DAY 05/05/18   Bensimhon, Shaune Pascal, MD  Budesonide (PULMICORT IN) Inhale into the lungs as directed.    02/29/12  [provider]    Family History History reviewed. No pertinent family history.  Social History Social History   Tobacco Use  . Smoking status: Current Every Day Smoker    Types: Cigarettes  . Smokeless tobacco: Never Used  Substance Use Topics  . Alcohol use: No  . Drug use: No     Allergies   Prednisone; Zebeta [bisoprolol fumarate]; Eggs or egg-derived products; and Statins   Review of Systems Review of Systems  Constitutional: Positive for activity change.    Respiratory: Positive for shortness of breath.   Cardiovascular: Negative for chest pain.  Gastrointestinal: Positive for abdominal pain.  Allergic/Immunologic: Negative for immunocompromised state.  All other systems reviewed and are negative.    Physical Exam Updated Vital Signs BP 98/66   Pulse 95   Temp 99.4 F (37.4 C) (Oral)   Resp (!) 28   Ht 5' 3.5" (1.613 m)   Wt 60.8 kg (134 lb)   SpO2 97%   BMI 23.36 kg/m   Physical Exam  Constitutional: She is oriented to person, place, and time. She appears well-developed.  HENT:  Head: Normocephalic and atraumatic.  Eyes: EOM are normal.  Neck: Normal range of motion. Neck supple.  Cardiovascular: Normal rate.  Pulmonary/Chest: Effort normal.  Abdominal: Bowel sounds are normal. She exhibits distension. There is tenderness in the right upper quadrant and epigastric area. There is negative Murphy's sign.  Neurological: She is alert and oriented to person, place, and time.  Skin: Skin is warm and dry.  2+ pitting edema in bilateral lower extremities  Nursing note and vitals reviewed.    ED Treatments / Results  Labs (all labs ordered are listed, but only abnormal results are displayed) Labs Reviewed  COMPREHENSIVE METABOLIC PANEL - Abnormal; Notable for the following components:      Result Value   Sodium 125 (*)    Chloride 93 (*)    CO2 21 (*)    Glucose, Bld 116 (*)    Creatinine, Ser 2.04 (*)    Total Protein 6.1 (*)    Albumin 3.2 (*)    ALT 49 (*)    GFR calc non Af Amer 24 (*)    GFR calc Af Amer 27 (*)    All other components within normal limits  CBC - Abnormal; Notable for the following components:   RBC 3.72 (*)    Hemoglobin 11.0 (*)    HCT 34.7 (*)    RDW 16.8 (*)    Platelets 138 (*)    All other components within normal limits  BRAIN NATRIURETIC PEPTIDE - Abnormal; Notable for the following components:   B Natriuretic Peptide 3,135.7 (*)    All other components within normal limits  LIPASE,  BLOOD  URINALYSIS, ROUTINE W REFLEX MICROSCOPIC  TROPONIN I  CBC WITH DIFFERENTIAL/PLATELET  COMPREHENSIVE METABOLIC PANEL  MAGNESIUM  HIV ANTIBODY (ROUTINE TESTING)    EKG EKG Interpretation  Date/Time:  Saturday June 04 2018 01:24:01  EDT Ventricular Rate:  94 PR Interval:    QRS Duration: 137 QT Interval:  394 QTC Calculation: 493 R Axis:   -105 Text Interpretation:  Sinus tachycardia Paired ventricular premature complexes Probable left atrial enlargement Nonspecific IVCD with LAD Anterior infarct, old No acute changes Confirmed by Varney Biles [phone]) on 06/04/2018 1:49:41 AM   Radiology Dg Chest 2 View  Result Date: 06/04/2018 CLINICAL DATA:  67 year old female with epigastric pain, and nausea. EXAM: CHEST - 2 VIEW COMPARISON:  Chest CT dated 04/19/2018 FINDINGS: Probable mild emphysematous changes of the lungs and chronic bronchitic changes. There is blunting of the costophrenic angles which may be related to scarring. Trace pleural effusion is not excluded. There is no focal consolidation, there is no focal consolidation or pneumothorax. There is mild cardiomegaly. Left pectoral AICD device. No acute osseous pathology. IMPRESSION: 1. No acute cardiopulmonary process. 2. Chronic mild interstitial coarsening and bronchitic changes. Electronically Signed   By: Anner Crete M.D.   On: 06/04/2018 01:35    Procedures Procedures (including critical care time)  Medications Ordered in ED Medications  furosemide (LASIX) injection 80 mg (has no administration in time range)  acetaminophen (TYLENOL) tablet 650 mg (has no administration in time range)  albuterol (PROVENTIL HFA;VENTOLIN HFA) 108 (90 Base) MCG/ACT inhaler 2 puff (has no administration in time range)  aspirin chewable tablet 81 mg (has no administration in time range)  azelastine (ASTELIN) 0.1 % nasal spray 1 spray (has no administration in time range)  fluticasone furoate-vilanterol (BREO ELLIPTA) 200-25 MCG/INH 1  puff (has no administration in time range)  citalopram (CELEXA) tablet 20 mg (has no administration in time range)  clonazePAM (KLONOPIN) tablet 0.25-0.5 mg (has no administration in time range)  ezetimibe (ZETIA) tablet 10 mg (has no administration in time range)  isosorbide mononitrate (IMDUR) 24 hr tablet 30 mg (has no administration in time range)  pantoprazole (PROTONIX) EC tablet 40 mg (has no administration in time range)  polyethylene glycol (MIRALAX / GLYCOLAX) packet 17 g (has no administration in time range)  rosuvastatin (CRESTOR) tablet 5 mg (has no administration in time range)  sodium chloride flush (NS) 0.9 % injection 3 mL (has no administration in time range)  sodium chloride flush (NS) 0.9 % injection 3 mL (has no administration in time range)  0.9 %  sodium chloride infusion (has no administration in time range)  ondansetron (ZOFRAN) injection 4 mg (has no administration in time range)  heparin injection 5,000 Units (has no administration in time range)  ondansetron (ZOFRAN) injection 4 mg (4 mg Intravenous Given 06/04/18 0138)  levETIRAcetam (KEPPRA) IVPB 1000 mg/100 mL premix (0 mg Intravenous Stopped 06/04/18 0202)     Initial Impression / Assessment and Plan / ED Course  I have reviewed the triage vital signs and the nursing notes.  Pertinent labs & imaging results that were available during my care of the patient were reviewed by me and considered in my medical decision making (see chart for details).  Clinical Course as of Jun 04 320  Sat Jun 04, 2018  0320 Patient informed that we had accidentally given her Keppra.  I apologized for mistake from my side.  I also called pharmacy to ensure that patient gets credit and not charge for the medication.  Patient appreciated that we informed her of the mistake.  No complications suspected.   [AN]    Clinical Course User Index [AN] Varney Biles, MD    67 year old female with history of CHF, CAD comes  in with chief  complaint of leg swelling, exertional dyspnea and increasing abdominal girth.  Patient has a 10 pound weight gain and despite adding Zaroxolyn to her diuretic regimen, she has not improved.  Clinically if she is having acute CHF exacerbation.  BNP has been added to her lab work-up.  Labs from triage indicate that patient is having worsening renal function and low sodium -likely due to CHF and increased Lasix respectively.  Patient will need admission for optimization.  Patient is abdominal discomfort is not peritoneal in nature. Cholelithiasis, hepatic congestion are possible etiology for the pain. Labs reassuring - no need for [place].   Final Clinical Impressions(s) / ED Diagnoses   Final diagnoses:  Acute on chronic combined systolic and diastolic congestive heart failure ([place])  AKI (acute kidney injury) [facility])    ED Discharge Orders    None       Varney Biles, MD 06/04/18 0321

## 2018-06-04 NOTE — ED Notes (Signed)
Patient transported to X-ray 

## 2018-06-04 NOTE — H&P (Signed)
Cardiology Admission History and Physical:   Patient ID: DIA DONATE; MRN: [id number]; DOB: [birth year]   Admission date: 06/03/2018  Primary Care Provider: [facility] Primary Cardiologist: Jenkins Rouge, MD and Dr. Simeon Craft Primary Electrophysiologist:  Dr. Rayann Heman  Chief Complaint:  I'm up 10 pounds  Patient Profile:   [name] is a 67 y.o. female with a history of ischemic cardiomyopathy, systolic heart failure ([id number]%), coronary artery disease, prior anterior MI, moderate mitral regurgitation, PVD, HLD, tobacco abuse,  AICD placed, LBBB  History of Present Illness:   [name] reports progressive dyspnea with exertion over the past two weeks and associated lower extremity swelling. She notes about a 10 pound weight gain from her dry weight of 124 pounds. She called the heart failure office and was given metolazone to help her volume status. She has taken it for about 4-5 days and denies any noticeable urine output change or symptom improvement. She reports abdominal swelling and nausea. She denies orthopnea and PND. She reports eating out at restaurants for most of her meals but thinks she avoids excess sodium.   Past Medical History:  Diagnosis Date  . Arthritis    hands  . Asthma   . CAD (coronary artery disease)    a. s/p prior Ant MI, b. s/p prior POBA to LAD, Dx, RCA,;  c.  LHC (10/12):  dLAD 40, pCFX 30, OM1 50, pRCA 30;  d.  [facility] Myoview (11/14):  High risk study; inferior, anterior, apical defects; minimal reversibility toward the apex; consistent with prior infarct and very mild peri-infarct ischemia, EF 24%, inferior/apical/anterior akinesis  . Chronic systolic heart failure ([place])   . Depression   . H/O hiatal hernia   . HLD (hyperlipidemia)   . HTN (hypertension)   . Ischemic cardiomyopathy    MRI (12/09):  High scar burden, Inf and Apical AK, EF 24%.;  Echo (10/14): EF 15%, Gr 2 DD, mild to mod MR, mod LAE, RVSF mildly  reduced, mod RAE, severe TR, PASP 49-53 (mod pulmonary HTN)  . MI (myocardial infarction) ([place])   . Tobacco abuse     Past Surgical History:  Procedure Laterality Date  . ABDOMINAL AORTOGRAM W/LOWER EXTREMITY N/A 03/02/2018   Procedure: ABDOMINAL AORTOGRAM W/LOWER EXTREMITY;  Surgeon: Wellington Hampshire, MD;  Location: [place] [facility];  Service: Cardiovascular;  Laterality: N/A;  . CARDIAC DEFIBRILLATOR PLACEMENT  01/18/2009   MDT single chamber ICD implanted by Dr Rayann Heman   . cardiac stents    . COLONOSCOPY  03/18/2012   Procedure: COLONOSCOPY;  Surgeon: Beryle Beams, MD;  Location: [facility];  Service: Endoscopy;  Laterality: N/A;  . CORONARY ANGIOPLASTY    . ESOPHAGOGASTRODUODENOSCOPY N/A 03/10/2013   Procedure: ESOPHAGOGASTRODUODENOSCOPY (EGD);  Surgeon: Beryle Beams, MD;  Location: [facility];  Service: Endoscopy;  Laterality: N/A;  . LEFT HEART CATHETERIZATION WITH CORONARY ANGIOGRAM N/A 07/26/2014   Procedure: LEFT HEART CATHETERIZATION WITH CORONARY ANGIOGRAM;  Surgeon: Jolaine Artist, MD;  Location: [facility]ATH LAB;  Service: Cardiovascular;  Laterality: N/A;     Medications Prior to Admission: Prior to Admission medications   Medication Sig Start Date End Date Taking? Authorizing Provider  acetaminophen (TYLENOL ARTHRITIS PAIN) 650 MG CR tablet Take 650 mg by mouth every 8 (eight) hours as needed for pain.   Yes [provider]  albuterol (PROAIR HFA) 108 (90 Base) MCG/ACT inhaler Inhale 2 puffs into the lungs every 6 (six) hours as needed for wheezing. 01/12/18  Yes [provider]  aspirin 81 MG tablet Take 81 mg by mouth daily.     Yes [provider]  azelastine (ASTELIN) 0.1 % nasal spray Place 1 spray into both nostrils daily. Use in each nostril as directed    Yes [provider]  budesonide-formoterol (SYMBICORT) 160-4.5 MCG/ACT inhaler Inhale 2 puffs into the lungs 2 (two) times daily. 10/14/17  Yes [provider]    citalopram (CELEXA) 20 MG tablet Take 20 mg by mouth daily. 12/16/17  Yes [provider]  clonazePAM (KLONOPIN) 0.5 MG tablet Take 0.25-0.5 mg by mouth 2 (two) times daily as needed for anxiety.   Yes [provider]  Cyanocobalamin ([employer]S B12 PO) Take 1 capsule by mouth as directed.   Yes [provider]  ezetimibe (ZETIA) 10 MG tablet TAKE 1/2 TABLET TWICE A WEEK 07/02/17  Yes Bensimhon, Shaune Pascal, MD  fexofenadine (ALLEGRA) 180 MG tablet Take 180 mg by mouth daily as needed for allergies.    Yes [provider]  fish oil-omega-3 fatty acids 1000 MG capsule Take 2 g by mouth daily.    Yes [provider]  furosemide (LASIX) 20 MG tablet TAKE 2 TABLETS BY MOUTH DAILY. MAY TAKE ADDITIONAL 2 TABS AS NEEDED FOR SWELLING/ SHORT OF BREATH 09/23/17  Yes Bensimhon, Shaune Pascal, MD  IRON PO Take 1 capsule by mouth as directed.   Yes [provider]  isosorbide mononitrate (IMDUR) 30 MG 24 hr tablet Take 30 mg by mouth daily.   Yes [provider]  metolazone (ZAROXOLYN) 2.5 MG tablet Take 1 tablet (2.5 mg total) by mouth as needed (swelling/wt gain.). Only take when instructed by HF clinic. 06/01/18  Yes Georgiana Shore, NP  nitroGLYCERIN (NITROSTAT) 0.4 MG SL tablet Place 1 tablet (0.4 mg total) under the tongue every 5 (five) minutes as needed for chest pain. 10/28/16  Yes Josue Hector, MD  omeprazole (PRILOSEC) 40 MG capsule Take 40 mg by mouth daily.    Yes [provider]  polyethylene glycol (MIRALAX / GLYCOLAX) packet Take 17 g by mouth at bedtime.   Yes [provider]  rosuvastatin (CRESTOR) 5 MG tablet Take 1 tablet by mouth on Monday, Wednesday, and Friday 04/29/18  Yes Arida, Mertie Clause, MD  sacubitril-valsartan (ENTRESTO) 49-51 MG Take 1 tablet by mouth 2 (two) times daily. 10/05/17  Yes Bensimhon, Shaune Pascal, MD  spironolactone (ALDACTONE) 25 MG tablet TAKE 1 TABLET BY MOUTH EVERY DAY 05/05/18  Yes Bensimhon, Shaune Pascal, MD   Budesonide (PULMICORT IN) Inhale into the lungs as directed.    02/29/12  [provider]     Allergies:    Allergies  Allergen Reactions  . Prednisone Shortness Of Breath, Swelling, Rash and Other (See Comments)    Sore throat, also  . Zebeta [Bisoprolol Fumarate] Other (See Comments)    Pt states she couldn't think and it "messed up" her head  . Statins Other (See Comments)    Gradually tired with daily Lipitor, made pt feel badly (Pt can take Crestor 5 mg 4 times per week and Zetia 5 mg every other day)    Social History:   Social History   Socioeconomic History  . Marital status: Divorced    Spouse name: Not on file  . Number of children: 2  . Years of education: Not on file  . Highest education level: Not on file  Occupational History  . Occupation: Works for [employer]:  [employer]  Social Needs  . Financial resource strain: Not on file  . Food insecurity:    Worry: Not on file    Inability: Not on file  . Transportation needs:    Medical: Not on file    Non-medical: Not on file  Tobacco Use  . Smoking status: Current Every Day Smoker    Types: Cigarettes  . Smokeless tobacco: Never Used  Substance and Sexual Activity  . Alcohol use: No  . Drug use: No  . Sexual activity: Never  Lifestyle  . Physical activity:    Days per week: Not on file    Minutes per session: Not on file  . Stress: Not on file  Relationships  . Social connections:    Talks on phone: Not on file    Gets together: Not on file    Attends religious service: Not on file    Active member of club or organization: Not on file    Attends meetings of clubs or organizations: Not on file    Relationship status: Not on file  . Intimate partner violence:    Fear of current or ex partner: Not on file    Emotionally abused: Not on file    Physically abused: Not on file    Forced sexual activity: Not on file  Other Topics Concern  . Not on file  Social History Narrative   Lives in  [place]     Family History:   She denies family history of cardiomyopathy and sudden cardiac death.  REVIEW OF SYSTEMS (positive in bold, otherwise negative):  Constitutional: change in weight, fever/chills, fatigue, diaphoresis Skin: change in color, rashes, ulcerations or suspicious lesions HENT: head trauma, headache, difficulty hearing, nasal discharge, sore throat Eyes: blurred vision, diplopia Cardiovascular: chest pain, palpitations, syncope, edema, orthopnea, PND Respiratory: pleuritic chest pain, SOB, DOE, cough Gastrointestinal: constipation, abdominal pain, change in stool, nausea, vomiting, diarrhea  Genitourinary: dysuria, hematuria, incontinence, urgency Musculoskeletal: arthritis, muscle stiffness, weakness. Neurological: seizures, focal weakness, sensory change  Physical Exam/Data:   Vitals:   06/04/18 0100 06/04/18 0115 06/04/18 0145 06/04/18 0200  BP: 94/75 (!) 112/47 112/65 99/76  Pulse: 92 89 87 89  Resp: (!) 23 (!) 25 19 (!) 22  Temp:      TempSrc:      SpO2: 98% 98% 98% 97%  Weight:      Height:        Intake/Output Summary (Last 24 hours) at 06/04/2018 0225 Last data filed at 06/04/2018 0202 Gross per 24 hour  Intake 100 ml  Output -  Net 100 ml   Filed Weights   06/03/18 1957  Weight: 60.8 kg (134 lb)   Body mass index is 23.36 kg/m.   General: Alert, cooperative, no distress, appears older than stated age.  Head: Normocephalic, without obvious abnormality, atraumatic.  Eyes: Conjunctivae/corneas clear. PERRL, EOMs intact.   Nose: Nares normal.  Septum midline, Mucosa normal. No drainage or sinus tenderness.  Throat: Lips, mucosa, and tongue normal.  Teeth and gums normal.  Neck: Supple, symmetrical, trachea midline, no adenopathy, no thryroid enlargment, tenderness or nodules, no carotid bruit and no JVD  Lungs: Clear to auscultation bilaterally.  Heart: Regular rate and rhythm, S1, S2 normal, no murmur, click, rub or gallop  Chest  Wall: No tenderness.  Abdomen: Soft, non-tender.  Bowel sounds normal.  No masses. No organomegaly.  Extremities: Extremities normal, atraumatic, +2 pitting edema bilaterally   Skin: Skin color, texture, turgor normal.  No  rashes or lesions.  Neurologic: Nonfocal   Relevant CV Studies: EKG:  NSR with LBBB  Echo:  Study Conclusions  - Left ventricle: Systolic function was severely reduced. The   estimated ejection fraction was in the range of 20% to 25%.   Diffuse hypokinesis. Doppler parameters are consistent with a   reversible restrictive pattern, indicative of decreased left   ventricular diastolic compliance and/or increased left atrial   pressure (grade 3 diastolic dysfunction). - Mitral valve: There was moderate regurgitation. - Left atrium: The atrium was severely dilated. - Pulmonary arteries: Systolic pressure was mildly to moderately   increased. PA peak pressure: 40 mm Hg (S).   Laboratory Data: Recent Labs    06/03/18 2016  WBC 7.4  HGB 11.0*  HCT 34.7*  PLT 138*   Recent Labs    06/03/18 2016  NA 125*  K 4.4  CL 93*  CO2 21*  BUN 22  CREATININE 2.04*   Recent Labs    06/03/18 2016  AST 37  ALT 49*  ALKPHOS 90  BILITOT 1.0   Lab Results  Component Value Date   TSH 1.630 07/25/2014    Cardiac Enzymes Recent Labs  Lab 06/04/18 0132  TROPONINI <0.03   BNP Recent Labs  Lab 06/04/18 0132  BNP 3,135.7*   Radiology/Studies:  Dg Chest 2 View  Result Date: 06/04/2018 CLINICAL DATA:  66 year old female with epigastric pain, and nausea. EXAM: CHEST - 2 VIEW COMPARISON:  Chest CT dated 04/19/2018 FINDINGS: Probable mild emphysematous changes of the lungs and chronic bronchitic changes. There is blunting of the costophrenic angles which may be related to scarring. Trace pleural effusion is not excluded. There is no focal consolidation, there is no focal consolidation or pneumothorax. There is mild cardiomegaly. Left pectoral AICD device. No acute  osseous pathology. IMPRESSION: 1. No acute cardiopulmonary process. 2. Chronic mild interstitial coarsening and bronchitic changes. Electronically Signed   By: Anner Crete M.D.   On: 06/04/2018 01:35    Assessment and Plan:   1. Acute Systolic Heart Failure - Ischemic cardiomyopathy with AICD, no CRT - Volume overloaded and failed outpatient medication changes - Will start with IV Lasix 80 mg once and then monitor response before additional doses - Will hold Metolazone, Entresto and Aldactone with AKI  2. AKI on CKD III - Hold nephrotoxic agents - I think likely related to vascular congestion and volume overload  3. Coronary artery disease with prior Anterior MI - Continue ASA and Statin  4. LBBB, QRS 137 - Could consider upgrade to CRT if recurrent HF admissions  5. Hyponatremia - Na 125 likely due to hypervolemia  - Repeat BMP in AM to follow trend  Severity of Illness: The appropriate patient status for this patient is INPATIENT. Inpatient status is judged to be reasonable and necessary in order to provide the required intensity of service to ensure the patient's safety. The patient's presenting symptoms, physical exam findings, and initial radiographic and laboratory data in the context of their chronic comorbidities is felt to place them at high risk for further clinical deterioration. Furthermore, it is not anticipated that the patient will be medically stable for discharge from the hospital within 2 midnights of admission. The following factors support the patient status of inpatient.   " The patient's presenting symptoms include dyspnea and lower extremity edema. " The worrisome physical exam findings include lower extremity edema " The initial radiographic and laboratory data are worrisome because of BNP 3000, Creatinine 2.0, Na  125. " The chronic co-morbidities include CAD, PVD, tobacco abuse   * I certify that at the point of admission it is my clinical judgment that  the patient will require inpatient hospital care spanning beyond 2 midnights from the point of admission due to high intensity of service, high risk for further deterioration and high frequency of surveillance required.*    For questions or updates, please contact [place] Please consult [email or web address] for contact info under Cardiology/STEMI.    Signed, Roselyn Meier, MD  06/04/2018 2:25 AM

## 2018-06-04 NOTE — Progress Notes (Signed)
Patient admitted to 6E10 from ED via w/c.  Bed in low position, wheels locked.  Patient denies chest pain/shortness of breath.  Portable telemetry monitor applied.  Patient oriented to environment, including call bell, TV, meal times, and hourly rounding.  Informed patient that we need to closely monitor her intake and output. Patient states understanding.  Hat placed in bathroom to measure urine.

## 2018-06-04 NOTE — Progress Notes (Signed)
MEDICATION RELATED NOTE  Spoke w/ EDP Dr Kathrynn Humble who requested that pt not be charged for levetiracetam dose given 06/04/18 0144; I have credited the charge for the medication.  Wynona Neat, PharmD, BCPS  06/04/2018,2:37 AM

## 2018-06-05 ENCOUNTER — Inpatient Hospital Stay (HOSPITAL_COMMUNITY): Payer: Medicare Other

## 2018-06-05 DIAGNOSIS — I5023 Acute on chronic systolic (congestive) heart failure: Secondary | ICD-10-CM

## 2018-06-05 DIAGNOSIS — R0603 Acute respiratory distress: Secondary | ICD-10-CM

## 2018-06-05 DIAGNOSIS — I340 Nonrheumatic mitral (valve) insufficiency: Secondary | ICD-10-CM

## 2018-06-05 DIAGNOSIS — I361 Nonrheumatic tricuspid (valve) insufficiency: Secondary | ICD-10-CM

## 2018-06-05 LAB — BASIC METABOLIC PANEL
Anion gap: 10 (ref 5–15)
BUN: 36 mg/dL — ABNORMAL HIGH (ref 8–23)
CHLORIDE: 90 mmol/L — AB (ref 98–111)
CO2: 23 mmol/L (ref 22–32)
Calcium: 8.7 mg/dL — ABNORMAL LOW (ref 8.9–10.3)
Creatinine, Ser: 2.1 mg/dL — ABNORMAL HIGH (ref 0.44–1.00)
GFR calc non Af Amer: 23 mL/min — ABNORMAL LOW (ref >60)
GFR, EST AFRICAN AMERICAN: 27 mL/min — AB (ref >60)
Glucose, Bld: 100 mg/dL — ABNORMAL HIGH (ref 70–99)
POTASSIUM: 4.8 mmol/L (ref 3.5–5.1)
SODIUM: 123 mmol/L — AB (ref 135–145)

## 2018-06-05 LAB — CBC
HEMATOCRIT: 33.7 % — AB (ref 36.0–46.0)
Hemoglobin: 10.9 g/dL — ABNORMAL LOW (ref 12.0–15.0)
MCH: 30 pg (ref 26.0–34.0)
MCHC: 32.3 g/dL (ref 30.0–36.0)
MCV: 92.8 fL (ref 78.0–100.0)
Platelets: 138 10*3/uL — ABNORMAL LOW (ref 150–400)
RBC: 3.63 MIL/uL — AB (ref 3.87–5.11)
RDW: 16.7 % — ABNORMAL HIGH (ref 11.5–15.5)
WBC: 6.5 10*3/uL (ref 4.0–10.5)

## 2018-06-05 LAB — ECHOCARDIOGRAM COMPLETE
Height: 63 in
WEIGHTICAEL: 2169.6 [oz_av]

## 2018-06-05 MED ORDER — FUROSEMIDE 10 MG/ML IJ SOLN
80.0000 mg | Freq: Two times a day (BID) | INTRAMUSCULAR | Status: DC
  Administered 2018-06-05 – 2018-06-07 (×5): 80 mg via INTRAVENOUS
  Filled 2018-06-05 (×5): qty 8

## 2018-06-05 MED ORDER — NICOTINE 7 MG/24HR TD PT24
7.0000 mg | MEDICATED_PATCH | Freq: Every day | TRANSDERMAL | Status: DC
  Administered 2018-06-05: 7 mg via TRANSDERMAL
  Filled 2018-06-05 (×2): qty 1

## 2018-06-05 MED ORDER — FUROSEMIDE 10 MG/ML IJ SOLN
80.0000 mg | Freq: Once | INTRAMUSCULAR | Status: AC
  Administered 2018-06-05: 80 mg via INTRAVENOUS
  Filled 2018-06-05: qty 8

## 2018-06-05 NOTE — Progress Notes (Signed)
OT Cancellation Note  Patient Details Name: JOHNAY MANO MRN: [id number] DOB: 1951/04/24   Cancelled Treatment:    Reason Eval/Treat Not Completed: OT screened, no needs identified, will sign off.  [place], OTR/L [phone]   Lucille Passy M 06/05/2018, 11:27 AM

## 2018-06-05 NOTE — Evaluation (Signed)
Physical Therapy Evaluation Patient Details Name: [name] MRN: [id number] DOB: 08-Jul-1951 Today's Date: 06/05/2018   History of Present Illness  [name] is a 67 y.o. female with a history of ischemic cardiomyopathy, systolic heart failure (KYHC62%), coronary artery disease, prior anterior MI, moderate mitral regurgitation, PVD, HLD, tobacco abuse,  AICD placed, LBBB  Clinical Impression  Patient independent in all mobility, including ambulation,  picking up item off floor, transfer on/off toilet.  No further PT needs identified.  Will sign off.      Follow Up Recommendations No PT follow up    Equipment Recommendations  None recommended by PT    Recommendations for Other Services       Precautions / Restrictions Precautions Precautions: None      Mobility  Bed Mobility Overal bed mobility: Independent                Transfers Overall transfer level: Independent                  Ambulation/Gait Ambulation/Gait assistance: Modified independent (Device/Increase time) Gait Distance (Feet): 100 Feet Assistive device: None Gait Pattern/deviations: WFL(Within Functional Limits)        Stairs            Wheelchair Mobility    Modified Rankin (Stroke Patients Only)       Balance Overall balance assessment: No apparent balance deficits (not formally assessed)                                           Pertinent Vitals/Pain Pain Assessment: No/denies pain    Home Living Family/patient expects to be discharged to:: Private residence Living Arrangements: Alone   Type of Home: House Home Access: Stairs to enter   Technical brewer of Steps: 1 Home Layout: One level Home Equipment: None      Prior Function Level of Independence: Independent         Comments: does own yardwork and housework; very active     Journalist, newspaper        Extremity/Trunk Assessment   Upper Extremity Assessment Upper  Extremity Assessment: Overall WFL for tasks assessed    Lower Extremity Assessment Lower Extremity Assessment: Overall WFL for tasks assessed    Cervical / Trunk Assessment Cervical / Trunk Assessment: Normal  Communication   Communication: No difficulties  Cognition Arousal/Alertness: Awake/alert Behavior During Therapy: WFL for tasks assessed/performed Overall Cognitive Status: Within Functional Limits for tasks assessed                                        General Comments      Exercises     Assessment/Plan    PT Assessment Patent does not need any further PT services  PT Problem List         PT Treatment Interventions      PT Goals (Current goals can be found in the Care Plan section)  Acute Rehab PT Goals Patient Stated Goal: go home when this fluid goes down PT Goal Formulation: All assessment and education complete, DC therapy    Frequency     Barriers to discharge        Co-evaluation               AM-PAC PT "  6 Clicks" Daily Activity  Outcome Measure Difficulty turning over in bed (including adjusting bedclothes, sheets and blankets)?: None Difficulty moving from lying on back to sitting on the side of the bed? : None Difficulty sitting down on and standing up from a chair with arms (e.g., wheelchair, bedside commode, etc,.)?: None Help needed moving to and from a bed to chair (including a wheelchair)?: None Help needed walking in hospital room?: None Help needed climbing 3-5 steps with a railing? : None 6 Click Score: 24    End of Session   Activity Tolerance: Patient tolerated treatment well Patient left: in bed;with call bell/phone within reach   PT Visit Diagnosis: Unsteadiness on feet (R26.81)    Time: 1045-1100 PT Time Calculation (min) (ACUTE ONLY): 15 min   Charges:   PT Evaluation $PT Eval Low Complexity: 1 Low     PT G Codes:        July 01, 2018 [facility], PT [phone]    Shanna Cisco 07/01/18, 11:09 AM

## 2018-06-05 NOTE — Progress Notes (Signed)
Progress Note  Patient Name: [name] Date of Encounter: 06/05/2018  Primary Cardiologist: Terri Rouge, MD   Subjective   SOB improving.   Inpatient Medications    Scheduled Meds: . aspirin  81 mg Oral Daily  . azelastine  1 spray Each Nare Daily  . citalopram  20 mg Oral Daily  . ezetimibe  10 mg Oral Daily  . fluticasone furoate-vilanterol  1 puff Inhalation Daily  . heparin  5,000 Units Subcutaneous Q8H  . isosorbide mononitrate  30 mg Oral Daily  . pantoprazole  40 mg Oral Daily  . polyethylene glycol  17 g Oral QHS  . rosuvastatin  5 mg Oral Daily  . sodium chloride flush  3 mL Intravenous Q12H   Continuous Infusions: . sodium chloride     PRN Meds: sodium chloride, acetaminophen, albuterol, clonazePAM, ondansetron (ZOFRAN) IV, sodium chloride flush   Vital Signs    Vitals:   06/04/18 2033 06/05/18 0635 06/05/18 0756 06/05/18 0759  BP: (!) 94/56 97/68    Pulse: 87 92    Resp:      Temp: (!) 97.5 F (36.4 C) 98.7 F (37.1 C)    TempSrc: Oral Oral    SpO2: 96% 97% 97% 96%  Weight:  135 lb 9.6 oz (61.5 kg)    Height:        Intake/Output Summary (Last 24 hours) at 06/05/2018 0848 Last data filed at 06/05/2018 0741 Gross per 24 hour  Intake 603 ml  Output 1090 ml  Net -487 ml   Filed Weights   06/03/18 1957 06/04/18 1501 06/05/18 0635  Weight: 134 lb (60.8 kg) 134 lb 8 oz (61 kg) 135 lb 9.6 oz (61.5 kg)    Telemetry    SR and mild sinus tach - Personally Reviewed  ECG    na  Physical Exam   GEN: No acute distress.   Neck: Elevated JVD Cardiac: RRR, no murmurs, rubs, or gallops.  Respiratory: Clear to auscultation bilaterally. GI: Soft, nontender, non-distended  MS: 1+ bilaterla edema; No deformity. Neuro:  Nonfocal  Psych: Normal affect   Labs    Chemistry Recent Labs  Lab 06/03/18 2016 06/04/18 0701  NA 125* 125*  K 4.4 4.6  CL 93* 94*  CO2 21* 20*  GLUCOSE 116* 88  BUN 22 22  CREATININE 2.04* 1.85*  CALCIUM 9.2  8.8*  PROT 6.1* 5.6*  ALBUMIN 3.2* 2.9*  AST 37 77*  ALT 49* 81*  ALKPHOS 90 81  BILITOT 1.0 1.0  GFRNONAA 24* 27*  GFRAA 27* 31*  ANIONGAP 11 11     Hematology Recent Labs  Lab 06/03/18 2016 06/04/18 0701  WBC 7.4 5.6  RBC 3.72* 3.60*  HGB 11.0* 10.7*  HCT 34.7* 33.8*  MCV 93.3 93.9  MCH 29.6 29.7  MCHC 31.7 31.7  RDW 16.8* 16.9*  PLT 138* 126*    Cardiac Enzymes Recent Labs  Lab 06/04/18 0132  TROPONINI <0.03   No results for input(s): TROPIPOC in the last 168 hours.   BNP Recent Labs  Lab 06/04/18 0132  BNP 3,135.7*     DDimer No results for input(s): DDIMER in the last 168 hours.   Radiology    Dg Chest 2 View  Result Date: 06/04/2018 CLINICAL DATA:  67 year old female with epigastric pain, and nausea. EXAM: CHEST - 2 VIEW COMPARISON:  Chest CT dated 04/19/2018 FINDINGS: Probable mild emphysematous changes of the lungs and chronic bronchitic changes. There is blunting of the costophrenic angles  which may be related to scarring. Trace pleural effusion is not excluded. There is no focal consolidation, there is no focal consolidation or pneumothorax. There is mild cardiomegaly. Left pectoral AICD device. No acute osseous pathology. IMPRESSION: 1. No acute cardiopulmonary process. 2. Chronic mild interstitial coarsening and bronchitic changes. Electronically Signed   By: [name] M.D.   On: 06/04/2018 01:35    Cardiac Studies    Patient Profile     [name] is a 67 y.o. female with a history of ischemic cardiomyopathy, systolic heart failure ([id number]%), coronary artery disease, prior anterior MI, moderate mitral regurgitation, PVD, HLD, tobacco abuse,  AICD placed, LBBB    Assessment & Plan    1. Acute on chronic systolic heart failure/ICM - 01/2018 echo LVEF 20-25%, diffuse hypokinesis. Restrictive diastolic function. Mod MR - from notes looks to have 10 pounds at home, did not improve with increased home diuretics.  - negative 333mL  overnight, received lasix 80mg  IV x 1. Downtrend in Cr with diuresis consistent with venous congestin and CHF - entresto,adlactone, metolazone on hold due to AKI. From CHF clinic notes does not tolerate beta blockers.    2. AKI - baseline Cr 0.9 3 months ago. Up to 2 on admissoin. - Cr trending down with diuresis, likely due to venous congestion and CHF. Follow labs with further diuresis.  - entresto, aldactone, metolazone held. If renal function continues trending down restart low dose entresto tomorrow and titrate as tolerated  3. Hypnatremia - in setting of systolic HF, follow Na with diuresis    For questions or updates, please contact [place] Please consult [email or web address] for contact info under Cardiology/STEMI.      Terri Pew, MD  06/05/2018, 8:48 AM

## 2018-06-05 NOTE — Progress Notes (Signed)
  Echocardiogram 2D Echocardiogram has been performed.  [name] G [name] 06/05/2018, 2:50 PM

## 2018-06-06 ENCOUNTER — Encounter (HOSPITAL_COMMUNITY): Payer: Medicare Other

## 2018-06-06 DIAGNOSIS — N179 Acute kidney failure, unspecified: Secondary | ICD-10-CM

## 2018-06-06 DIAGNOSIS — I5043 Acute on chronic combined systolic (congestive) and diastolic (congestive) heart failure: Secondary | ICD-10-CM

## 2018-06-06 LAB — BASIC METABOLIC PANEL
Anion gap: 11 (ref 5–15)
BUN: 38 mg/dL — AB (ref 8–23)
CALCIUM: 8.9 mg/dL (ref 8.9–10.3)
CO2: 24 mmol/L (ref 22–32)
Chloride: 91 mmol/L — ABNORMAL LOW (ref 98–111)
Creatinine, Ser: 1.92 mg/dL — ABNORMAL HIGH (ref 0.44–1.00)
GFR calc Af Amer: 30 mL/min — ABNORMAL LOW (ref >60)
GFR, EST NON AFRICAN AMERICAN: 26 mL/min — AB (ref >60)
Glucose, Bld: 98 mg/dL (ref 70–99)
Potassium: 3.9 mmol/L (ref 3.5–5.1)
SODIUM: 126 mmol/L — AB (ref 135–145)

## 2018-06-06 LAB — MAGNESIUM: MAGNESIUM: 1.9 mg/dL (ref 1.7–2.4)

## 2018-06-06 MED ORDER — NICOTINE 7 MG/24HR TD PT24
7.0000 mg | MEDICATED_PATCH | Freq: Every day | TRANSDERMAL | Status: DC
  Administered 2018-06-06 – 2018-06-07 (×2): 7 mg via TRANSDERMAL
  Filled 2018-06-06 (×2): qty 1

## 2018-06-06 NOTE — Progress Notes (Signed)
CARDIAC REHAB PHASE I   PRE:  Rate/Rhythm: 94 SR PVCs  BP:  Supine: 103/65  Sitting:   Standing:    SaO2: 98%RA  MODE:  Ambulation: 470 ft   POST:  Rate/Rhythm: 98 SR PVCs  BP:  Supine:   Sitting: 102/59  Standing:    SaO2: 98%RA 0955-1034 Pt walked 470 ft on RA with steady gait. C/o lightheadedness that got a little better with walk. Still with some SOB. Sats good on RA. Gave pt CHF booklet, low sodium diets. Discussed daily weights, importance of 2000 mg sodium restriction and 2L FR. Gave modified ex ed. Pt did not want to be referred to [facility] 2 at this time as she wanted to ex somewhere close to home. Gave brochure for [facility] program in case she changes her mind. Gave fake cigarette and smoking cessation handout. Pt has cut down to 6 a day but does not know if she can quit. Encouraged her to call [phone] quit now as needed.    Graylon Good, RN BSN  06/06/2018 10:29 AM

## 2018-06-06 NOTE — Progress Notes (Signed)
Progress Note  Patient Name: [name] Date of Encounter: 06/06/2018  Primary Cardiologist: Dr. Johnsie Cancel, Dr. Haroldine Laws, Dr. Rayann Heman   Subjective   Pt feels well today. She says she still feels fluid overloaded in her abdomen. Denies chest pain.   Inpatient Medications    Scheduled Meds: . aspirin  81 mg Oral Daily  . azelastine  1 spray Each Nare Daily  . citalopram  20 mg Oral Daily  . ezetimibe  10 mg Oral Daily  . fluticasone furoate-vilanterol  1 puff Inhalation Daily  . furosemide  80 mg Intravenous BID  . heparin  5,000 Units Subcutaneous Q8H  . isosorbide mononitrate  30 mg Oral Daily  . nicotine  7 mg Transdermal Daily  . pantoprazole  40 mg Oral Daily  . polyethylene glycol  17 g Oral QHS  . rosuvastatin  5 mg Oral Daily  . sodium chloride flush  3 mL Intravenous Q12H   Continuous Infusions: . sodium chloride     PRN Meds: sodium chloride, acetaminophen, albuterol, clonazePAM, ondansetron (ZOFRAN) IV, sodium chloride flush   Vital Signs    Vitals:   06/05/18 2109 06/06/18 0500 06/06/18 0603 06/06/18 0744  BP: (!) 92/53  92/60   Pulse: 89  91   Resp: 18  18   Temp: 98.4 F (36.9 C)  98.4 F (36.9 C)   TempSrc: Oral  Oral   SpO2: 96%  96% 95%  Weight:  133 lb 8 oz (60.6 kg)    Height:        Intake/Output Summary (Last 24 hours) at 06/06/2018 0955 Last data filed at 06/06/2018 0845 Gross per 24 hour  Intake 1320 ml  Output 2550 ml  Net -1230 ml   Filed Weights   06/04/18 1501 06/05/18 0635 06/06/18 0500  Weight: 134 lb 8 oz (61 kg) 135 lb 9.6 oz (61.5 kg) 133 lb 8 oz (60.6 kg)    Physical Exam   General: Frail, elderly,  NAD Skin: Warm, dry, intact  Head: Normocephalic, atraumatic,  clear, moist mucus membranes. Neck: Negative for carotid bruits. No JVD Lungs: Bilateral mild expiratory wheezes.  No rales, or rhonchi. Breathing is unlabored. Cardiovascular: RRR with S1 S2. No murmurs, rubs or gallops Abdomen: Soft, non-tender,  non-distended with normoactive bowel sounds. No hepatomegaly, No rebound/guarding. No obvious abdominal masses. MSK: Strength and tone appear normal for age. 5/5 in all extremities Extremities: BLE 1+ edema. No clubbing or cyanosis. DP/PT pulses 2+ bilaterally Neuro: Alert and oriented. No focal deficits. No facial asymmetry. MAE spontaneously. Psych: Responds to questions appropriately with normal affect.    Labs    Chemistry Recent Labs  Lab 06/03/18 2016 06/04/18 0701 06/05/18 1006 06/06/18 0502  NA 125* 125* 123* 126*  K 4.4 4.6 4.8 3.9  CL 93* 94* 90* 91*  CO2 21* 20* 23 24  GLUCOSE 116* 88 100* 98  BUN 22 22 36* 38*  CREATININE 2.04* 1.85* 2.10* 1.92*  CALCIUM 9.2 8.8* 8.7* 8.9  PROT 6.1* 5.6*  --   --   ALBUMIN 3.2* 2.9*  --   --   AST 37 77*  --   --   ALT 49* 81*  --   --   ALKPHOS 90 81  --   --   BILITOT 1.0 1.0  --   --   GFRNONAA 24* 27* 23* 26*  GFRAA 27* 31* 27* 30*  ANIONGAP 11 11 10 11      Hematology Recent Labs  Lab 06/03/18 2016 06/04/18 0701 06/05/18 1006  WBC 7.4 5.6 6.5  RBC 3.72* 3.60* 3.63*  HGB 11.0* 10.7* 10.9*  HCT 34.7* 33.8* 33.7*  MCV 93.3 93.9 92.8  MCH 29.6 29.7 30.0  MCHC 31.7 31.7 32.3  RDW 16.8* 16.9* 16.7*  PLT 138* 126* 138*    Cardiac Enzymes Recent Labs  Lab 06/04/18 0132  TROPONINI <0.03   No results for input(s): TROPIPOC in the last 168 hours.   BNP Recent Labs  Lab 06/04/18 0132  BNP 3,135.7*    DDimer No results for input(s): DDIMER in the last 168 hours.   Radiology    No results found.  Telemetry    06/06/18 NSR- Personally Reviewed  ECG    No new tracing as of 06/06/18 - Personally Reviewed  Cardiac Studies   Echocardiogram 06/05/18: Study Conclusions  - Left ventricle: The cavity size was moderately dilated. Wall   thickness was normal. The estimated ejection fraction was = 20%.   Diffuse hypokinesis. Doppler parameters are consistent with   restrictive physiology, indicative of  decreased left ventricular   diastolic compliance and/or increased left atrial pressure.   Doppler parameters are consistent with high ventricular filling   pressure. - Aortic valve: Valve area (VTI): 2.42 cm^2. Valve area (Vmax):   2.46 cm^2. - Mitral valve: There was moderate regurgitation. - Left atrium: The atrium was severely dilated. - Right ventricle: The cavity size was mildly to moderately   dilated. - Right atrium: The atrium was severely dilated. - Tricuspid valve: There was severe regurgitation. - Pulmonary arteries: Systolic pressure was moderately increased.   PA peak pressure: 50 mm Hg (S).  Patient Profile     67 y.o. female with a history of ischemic cardiomyopathy, systolic heart failure ([id number]%), coronary artery disease, prior anterior MI, moderate mitral regurgitation, PVD, HLD, tobacco abuse, AICD placed, LBBB  Assessment & Plan    1. Acute on chronic systolic heart failure: -Echocardiogram 01/2018 with LVEF of 20-25% with diffuse hypokinesis, restrictive diastolic function and moderate MR -Fluid volume overloaded on exam at admission>>>given IV Lasix 80mg  once with monitoring>>>BNP 3135 on admission  -Metolazone, Entresto and aldactone held secondary to acute>>> chronic AKI  -Weight, 133lb today, 134lb on admission>>appears to be at baseline weight from last office appointment  -I&O, net negative 1.7L, 24-total output 2.7L yesterday  -Currently on IV Lasix 80mg  twice daily  -Imdur 30mg  daily    2. Acute on chronic kidney disease stage III: -Creatinine, 1.92 today>>>2.10 yesterday -Baseline appears to be in the 1.2-1.5 range.  -Holding nephrotoxic medications  -Likely secondary to acute vascular congestion and fluid volume overload   3. Hyponatremia: -Low, 126 today>>>125 on admission -Asymptomatic   -Continue close BMET monitoring   4. Known LBBB: -Per chart review, consider CRT upgrade if recurrent HF admissions  5. CAD with prior anterior MI with  ischemic cardiomyopathy: -Denies chest pain or associated symptoms  -Single chamber AICD placed in 2010 -Continue ASA, statin  6. Hx of PVD: -PVD followed by Dr Fletcher Anon ABI .96 on right and .69 on left angio on March 2019 showed 60-70% left proximal SFA with distal occlusion with collaterals and 3 vessel run off  -ASA, statin   Signed, Kathyrn Drown NP-C [place] Pager: [phone] 06/06/2018, 9:55 AM     For questions or updates, please contact   Please consult [email or web address] for contact info under Cardiology/STEMI.

## 2018-06-06 NOTE — Progress Notes (Signed)
Pharmacist Heart Failure Core Measure Documentation  Assessment: [name] has an EF documented as 20% on 06/05/18 by Echo.  Rationale: Heart failure patients with left ventricular systolic dysfunction (LVSD) and an EF < 40% should be prescribed an angiotensin converting enzyme inhibitor (ACEI) or angiotensin receptor blocker (ARB) at discharge unless a contraindication is documented in the medical record.  This patient is not currently on an ACEI or ARB for HF.  This note is being placed in the record in order to provide documentation that a contraindication to the use of these agents is present for this encounter.  ACE Inhibitor or Angiotensin Receptor Blocker is contraindicated (specify all that apply)  []   ACEI allergy AND ARB allergy []   Angioedema []   Moderate or severe aortic stenosis []   Hyperkalemia []   Hypotension []   Renal artery stenosis [x]   Worsening renal function, preexisting renal disease or dysfunction  Hildred Laser, PharmD Clinical Pharmacist Please check Amion for pharmacy contact number

## 2018-06-07 DIAGNOSIS — I2510 Atherosclerotic heart disease of native coronary artery without angina pectoris: Secondary | ICD-10-CM

## 2018-06-07 LAB — BASIC METABOLIC PANEL
ANION GAP: 6 (ref 5–15)
BUN: 39 mg/dL — ABNORMAL HIGH (ref 8–23)
CO2: 28 mmol/L (ref 22–32)
CREATININE: 1.84 mg/dL — AB (ref 0.44–1.00)
Calcium: 8.4 mg/dL — ABNORMAL LOW (ref 8.9–10.3)
Chloride: 97 mmol/L — ABNORMAL LOW (ref 98–111)
GFR, EST AFRICAN AMERICAN: 31 mL/min — AB (ref >60)
GFR, EST NON AFRICAN AMERICAN: 27 mL/min — AB (ref >60)
GLUCOSE: 117 mg/dL — AB (ref 70–99)
Potassium: 3.5 mmol/L (ref 3.5–5.1)
Sodium: 131 mmol/L — ABNORMAL LOW (ref 135–145)

## 2018-06-07 MED ORDER — ALBUTEROL SULFATE (2.5 MG/3ML) 0.083% IN NEBU
INHALATION_SOLUTION | RESPIRATORY_TRACT | Status: AC
  Filled 2018-06-07: qty 3

## 2018-06-07 MED ORDER — POTASSIUM CHLORIDE CRYS ER 20 MEQ PO TBCR
40.0000 meq | EXTENDED_RELEASE_TABLET | Freq: Once | ORAL | Status: AC
  Administered 2018-06-07: 40 meq via ORAL
  Filled 2018-06-07: qty 2

## 2018-06-07 NOTE — Care Management Important Message (Signed)
Important Message  Patient Details  Name: [name] MRN: [id number] Date of Birth: 06-08-1951   Medicare Important Message Given:  Yes    [name] P [name] 06/07/2018, 3:05 PM

## 2018-06-07 NOTE — Progress Notes (Signed)
Progress Note  Patient Name: [name] Date of Encounter: 06/07/2018  Primary Cardiologist: Dr. Johnsie Cancel, Dr. Haroldine Laws, Dr. Rayann Heman   Subjective   Pt feeling well this AM. Denies chest pain or palpitations   Inpatient Medications    Scheduled Meds: . aspirin  81 mg Oral Daily  . azelastine  1 spray Each Nare Daily  . citalopram  20 mg Oral Daily  . ezetimibe  10 mg Oral Daily  . fluticasone furoate-vilanterol  1 puff Inhalation Daily  . furosemide  80 mg Intravenous BID  . heparin  5,000 Units Subcutaneous Q8H  . isosorbide mononitrate  30 mg Oral Daily  . nicotine  7 mg Transdermal Daily  . pantoprazole  40 mg Oral Daily  . polyethylene glycol  17 g Oral QHS  . rosuvastatin  5 mg Oral Daily  . sodium chloride flush  3 mL Intravenous Q12H   Continuous Infusions: . sodium chloride     PRN Meds: sodium chloride, acetaminophen, albuterol, clonazePAM, ondansetron (ZOFRAN) IV, sodium chloride flush   Vital Signs    Vitals:   06/06/18 2035 06/07/18 0505 06/07/18 0510 06/07/18 0743  BP: 99/60 91/63    Pulse: 90 81    Resp: 18 16    Temp: 98.4 F (36.9 C) 97.9 F (36.6 C)    TempSrc: Oral Oral    SpO2: 99% 95%  95%  Weight:   130 lb 12.8 oz (59.3 kg)   Height:        Intake/Output Summary (Last 24 hours) at 06/07/2018 0843 Last data filed at 06/07/2018 0700 Gross per 24 hour  Intake 1441 ml  Output 3150 ml  Net -1709 ml   Filed Weights   06/05/18 0635 06/06/18 0500 06/07/18 0510  Weight: 135 lb 9.6 oz (61.5 kg) 133 lb 8 oz (60.6 kg) 130 lb 12.8 oz (59.3 kg)    Physical Exam   General: Frail, elderly, NAD Skin: Warm, dry, intact  Head: Normocephalic, atraumatic, clear, moist mucus membranes. Neck: Negative for carotid bruits. No JVD Lungs: Bilateral expiratory wheezes. No rales or rhonchi. Breathing is unlabored. Cardiovascular: RRR with S1 S2. No murmurs, rubs, gallops, or LV heave appreciated. Abdomen: Soft, non-tender, non-distended with normoactive  bowel sounds. No obvious abdominal masses. MSK: Strength and tone appear normal for age. 5/5 in all extremities Extremities: BLE 1+ edema. No clubbing or cyanosis. DP/PT pulses 2+ bilaterally Neuro: Alert and oriented. No focal deficits. No facial asymmetry. MAE spontaneously. Psych: Responds to questions appropriately with normal affect.    Labs    Chemistry Recent Labs  Lab 06/03/18 2016 06/04/18 0701 06/05/18 1006 06/06/18 0502 06/07/18 0350  NA 125* 125* 123* 126* 131*  K 4.4 4.6 4.8 3.9 3.5  CL 93* 94* 90* 91* 97*  CO2 21* 20* 23 24 28   GLUCOSE 116* 88 100* 98 117*  BUN 22 22 36* 38* 39*  CREATININE 2.04* 1.85* 2.10* 1.92* 1.84*  CALCIUM 9.2 8.8* 8.7* 8.9 8.4*  PROT 6.1* 5.6*  --   --   --   ALBUMIN 3.2* 2.9*  --   --   --   AST 37 77*  --   --   --   ALT 49* 81*  --   --   --   ALKPHOS 90 81  --   --   --   BILITOT 1.0 1.0  --   --   --   GFRNONAA 24* 27* 23* 26* 27*  GFRAA 27*  31* 27* 30* 31*  ANIONGAP 11 11 10 11 6      Hematology Recent Labs  Lab 06/03/18 2016 06/04/18 0701 06/05/18 1006  WBC 7.4 5.6 6.5  RBC 3.72* 3.60* 3.63*  HGB 11.0* 10.7* 10.9*  HCT 34.7* 33.8* 33.7*  MCV 93.3 93.9 92.8  MCH 29.6 29.7 30.0  MCHC 31.7 31.7 32.3  RDW 16.8* 16.9* 16.7*  PLT 138* 126* 138*    Cardiac Enzymes Recent Labs  Lab 06/04/18 0132  TROPONINI <0.03   No results for input(s): TROPIPOC in the last 168 hours.   BNP Recent Labs  Lab 06/04/18 0132  BNP 3,135.7*     DDimer No results for input(s): DDIMER in the last 168 hours.   Radiology    No results found.  Telemetry    06/07/18 NSR HR 80-90's - Personally Reviewed  ECG     No new tracing as of 06/07/18- Personally Reviewed  Cardiac Studies   Echocardiogram 06/05/18: Study Conclusions  - Left ventricle: The cavity size was moderately dilated. Wall thickness was normal. The estimated ejection fraction was = 20%. Diffuse hypokinesis. Doppler parameters are consistent  with restrictive physiology, indicative of decreased left ventricular diastolic compliance and/or increased left atrial pressure. Doppler parameters are consistent with high ventricular filling pressure. - Aortic valve: Valve area (VTI): 2.42 cm^2. Valve area (Vmax): 2.46 cm^2. - Mitral valve: There was moderate regurgitation. - Left atrium: The atrium was severely dilated. - Right ventricle: The cavity size was mildly to moderately dilated. - Right atrium: The atrium was severely dilated. - Tricuspid valve: There was severe regurgitation. - Pulmonary arteries: Systolic pressure was moderately increased. PA peak pressure: 50 mm Hg (S).  Patient Profile     67 y.o. female with a history of ischemic cardiomyopathy, systolic heart failure ([id number]%), coronary artery disease, prior anterior MI, moderate mitral regurgitation, PVD, HLD, tobacco abuse, AICD placed, LBBB  Assessment & Plan    1. Acute on chronic systolic heart failure: -Echocardiogram 01/2018 with LVEF of 20-25% with diffuse hypokinesis, restrictive diastolic function and moderate MR -Fluid volume overloaded on exam at admission>>>given IV Lasix 80mg  once with monitoring>>>BNP 3135 on admission  -Metolazone, Entresto and aldactone held secondary to acute>>> chronic AKI  -Weight, 130lb today, 134lb on admission>>appears to be at baseline weight from last office appointment  -I&O, net negative 3.7L, 24-total output 1.9L yesterday  -Currently on IV Lasix 80mg  twice daily>>>will replace K+ today given high dose Lasix with a K of 3.5, 40 meq KDUR once and monitor potassium  -Imdur 30mg  daily    2. Acute on chronic kidney disease stage III: -Creatinine, improved today>>1.84, 1.92 yesterday  -Baseline appears to be in the 1.2-1.5 range.  -Holding nephrotoxic medications  -Likely secondary to acute vascular congestion and fluid volume overload   3. Hyponatremia: -Improved, 131 today>>>125 on  admission -Asymptomatic   -Continue close BMET monitoring   4. Known LBBB: -Per chart review, consider CRT upgrade if recurrent HF admissions  5. CAD with prior anterior MI with ischemic cardiomyopathy: -Denies chest pain or associated symptoms  -Single chamber AICD placed in 2010 -Continue ASA, statin  6. Hx of PVD: -PVD followed by Dr Fletcher Anon ABI .96 on right and .69 on left angio on March 2019 showed 60-70% left proximal SFA with distal occlusion with collaterals and 3 vessel run off  -ASA, statin  Signed, Kathyrn Drown NP-C [place] Pager: [phone] 06/07/2018, 8:43 AM     For questions or updates, please contact   Please consult  [email or web address] for contact info under Cardiology/STEMI.

## 2018-06-07 NOTE — Progress Notes (Signed)
CARDIAC REHAB PHASE I   PRE:  Rate/Rhythm: 89 SR PVCs  BP:  Supine: 97/59  Sitting:   Standing:    SaO2: 97%RA  MODE:  Ambulation: 800 ft   POST:  Rate/Rhythm: 98 SR PVCs  BP:  Supine:   Sitting: 106/61  Standing:    SaO2: 98%RA 1028-1055 Pt walked 800 ft on RA with steady gait and tolerated well. No DOE noted. Tolerated well.   Graylon Good, RN BSN  06/07/2018 10:51 AM

## 2018-06-08 ENCOUNTER — Other Ambulatory Visit: Payer: Self-pay | Admitting: Cardiology

## 2018-06-08 DIAGNOSIS — E871 Hypo-osmolality and hyponatremia: Secondary | ICD-10-CM

## 2018-06-08 DIAGNOSIS — I10 Essential (primary) hypertension: Secondary | ICD-10-CM

## 2018-06-08 DIAGNOSIS — I255 Ischemic cardiomyopathy: Secondary | ICD-10-CM

## 2018-06-08 DIAGNOSIS — N179 Acute kidney failure, unspecified: Secondary | ICD-10-CM

## 2018-06-08 LAB — BASIC METABOLIC PANEL
Anion gap: 8 (ref 5–15)
BUN: 35 mg/dL — AB (ref 8–23)
CO2: 31 mmol/L (ref 22–32)
CREATININE: 1.78 mg/dL — AB (ref 0.44–1.00)
Calcium: 9.3 mg/dL (ref 8.9–10.3)
Chloride: 97 mmol/L — ABNORMAL LOW (ref 98–111)
GFR calc Af Amer: 32 mL/min — ABNORMAL LOW (ref >60)
GFR, EST NON AFRICAN AMERICAN: 28 mL/min — AB (ref >60)
Glucose, Bld: 100 mg/dL — ABNORMAL HIGH (ref 70–99)
Potassium: 4.3 mmol/L (ref 3.5–5.1)
SODIUM: 136 mmol/L (ref 135–145)

## 2018-06-08 MED ORDER — FUROSEMIDE 80 MG PO TABS
80.0000 mg | ORAL_TABLET | Freq: Every day | ORAL | Status: DC
  Administered 2018-06-08: 80 mg via ORAL
  Filled 2018-06-08: qty 1

## 2018-06-08 MED ORDER — FUROSEMIDE 80 MG PO TABS: 80.0000 mg | ORAL_TABLET | Freq: Every day | ORAL | 3 refills | Status: DC

## 2018-06-08 NOTE — Progress Notes (Signed)
Discharge instructions (including medications) discussed with and copy provided to patient/caregiver. All belongings sent with patient. 

## 2018-06-08 NOTE — Discharge Summary (Signed)
Discharge Summary    Patient ID: ADAMARYS SHALL,  MRN: [id number], DOB/AGE: 1951-11-19 67 y.o.  Admit date: 06/03/2018 Discharge date: 06/08/2018  Primary Care Provider: [facility] Cardiologist: Jenkins Rouge, MD  Discharge Diagnoses    Principal Problem:   Acute CHF (congestive heart failure) [facility]) Active Problems:   Essential hypertension   Implantable cardioverter-defibrillator (ICD) in situ   Hyperlipidemia   Chronic systolic heart failure (HCC)   Chronic obstructive pulmonary disease (HCC)   Tobacco use   Cardiomyopathy ([place])   CAD (coronary artery disease)   Acute systolic heart failure (HCC)   AKI (acute kidney injury) ([place])   Hyponatremia  Allergies Allergies  Allergen Reactions  . Prednisone Shortness Of Breath, Swelling, Rash and Other (See Comments)    Sore throat, also  . Zebeta [Bisoprolol Fumarate] Other (See Comments)    Caused confusion  . Eggs Or Egg-Derived Products Other (See Comments)    Was told she is allergic to EGG WHITES  . Statins Other (See Comments)    Gradually tired with daily Lipitor, made pt feel badly (Pt can take Crestor 5 mg 4 times per week and Zetia 5 mg two times a week)   Diagnostic Studies/Procedures    Echocardiogram 06/05/2018: Study Conclusions  - Left ventricle: The cavity size was moderately dilated. Wall   thickness was normal. The estimated ejection fraction was = 20%.   Diffuse hypokinesis. Doppler parameters are consistent with   restrictive physiology, indicative of decreased left ventricular   diastolic compliance and/or increased left atrial pressure.   Doppler parameters are consistent with high ventricular filling   pressure. - Aortic valve: Valve area (VTI): 2.42 cm^2. Valve area (Vmax):   2.46 cm^2. - Mitral valve: There was moderate regurgitation. - Left atrium: The atrium was severely dilated. - Right ventricle: The cavity size was mildly to moderately    dilated. - Right atrium: The atrium was severely dilated. - Tricuspid valve: There was severe regurgitation. - Pulmonary arteries: Systolic pressure was moderately increased.   PA peak pressure: 50 mm Hg (S).  History of Present Illness     [name] is a 67 y.o. female with a history of ischemic cardiomyopathy, systolic heart failure (EGBT51%), coronary artery disease, prior anterior MI, moderate mitral regurgitation, PVD, HLD, tobacco abuse,  AICD placed, LBBB  Ms. Sposito reported progressive dyspnea with exertion over the past two weeks with associated lower extremity swelling. She noted an approximate 10lb weight gain from her dry weight of 124lbs. Due to her symptoms, she called the heart failure office and was given metolazone to help her volume status. She reported taking it for about 4-5 days prior to admission and denied any noticeable urine output change or symptom improvement. She reported increased abdominal swelling and nausea, both of which are her personal signs that her fluid volume is out of balance. She denied orthopnea and PND with some diet indiscretions with eating out at restaurants for most of her meals but felt that she has been avoiding excess sodium.  Given her symptoms, patient reported to [facility] on 06/03/2018 for further evaluation.  Hospital Course   In the ED, CXR revealed no acute cardia pulmonary process with chronic mild interstitial coarsening and rhonchitic changes.  A BNP level was drawn which was markedly elevated at 3135.  Patient's sodium was low at 125 however, patient was asymptomatic. Her creatinine was elevated at 2.04, with a baseline of 1.2-1.5.  Troponin level was  negative.  Cardiology was asked to admit given her acute on chronic systolic CHF presentation.  Given her volume overload, she was started on IV Lasix 80 mg x 1 and was monitored for her response.  Her metolazone, Entresto and Aldactone was held on admission secondary to AKI.  Over the course  of several days, she was diuresed with improvement, beginning first with IV Lasix with an eventual transition to PO Lasix 80 mg twice daily.  Her metolazone and Aldactone remain held secondary to AKI.  However, her creatinine improved with diuresis 2.04>1.85>2.10>1.92>1.84>1.78 on day of discharge. Her weight also improved with diuresis.  Admission weight 134lb with a charge weight of 128lb (last office visit 129.4 on 05/10/2018), near her dry weight baseline.  At discharge, she is net negative 5.7 L.   Follow-up echocardiogram was performed 06/05/2018 with an LVEF of 20% with diffuse hypokinesis, essentially unchanged from prior echocardiogram on 01/15/2018.  Medication changes include: Lasix 80 mg daily.  Hold Entresto and spironolactone until follow-up visit, follow with be met for AKI.  Metolazone PRN only for weight gain and call office if needed.  PCP in 1 week or sooner for be met and BP.  We will set her up with an appointment in the HF clinic within 1 to 2 weeks>>>06/27/18 at 12pm in the HF clinic   Other hospital problems include:  Acute on chronic kidney disease stage III: -Creatinine,improving >>1.78 02/2018,1.84  06/07/2018 -Baseline appears to be in the 1.2-1.5 range -Holding Entresto and spironolactone until follow-up appointment.  Metolazone PRN only  for weight gain only -Likely secondary to acute vascular congestion and fluid volume overload  Hyponatremia: -Improved, 1367/02/2018>>>125 on admission -Remain asymptomatic -Likely secondary to hypervolemia -Follow up with BMET post discharge   CAD with prior anterior MI with ischemic cardiomyopathy: -Denied chest pain or associated symptoms  -Single chamber AICD placed in 2010 -Continue ASA, statin  Hx of PVD: -PVD followed by Dr Fletcher Anon ABI 0.96 on right and 0.69 on leftangio on March 2019 showed 60-70% left proximal SFA with distal occlusion with collaterals and 3 vessel run off -Continue ASA, statin   Consultants: None    The patient was seen and examined by Dr. Harrell Gave who feels that she is stable and ready for discharge today, 06/08/2018. _____________  Discharge Vitals Blood pressure 104/66, pulse 84, temperature 98.2 F (36.8 C), temperature source Oral, resp. rate 17, height _0  (1.6 m), weight 128 lb 8 oz (58.3 kg), SpO2 96 %.  Filed Weights   06/06/18 0500 06/07/18 0510 06/08/18 0450  Weight: 133 lb 8 oz (60.6 kg) 130 lb 12.8 oz (59.3 kg) 128 lb 8 oz (58.3 kg)   Labs & Radiologic Studies    CBC Recent Labs    06/05/18 1006  WBC 6.5  HGB 10.9*  HCT 33.7*  MCV 92.8  PLT 300*   Basic Metabolic Panel Recent Labs    06/06/18 0502 06/07/18 0350 06/08/18 0452  NA 126* 131* 136  K 3.9 3.5 4.3  CL 91* 97* 97*  CO2 _1 GLUCOSE 98 117* 100*  BUN 38* 39* 35*  CREATININE 1.92* 1.84* 1.78*  CALCIUM 8.9 8.4* 9.3  MG 1.9  --   --   _____________  Dg Chest 2 View  Result Date: 06/04/2018 CLINICAL DATA:  67 year old female with epigastric pain, and nausea. EXAM: CHEST - 2 VIEW COMPARISON:  Chest CT dated 04/19/2018 FINDINGS: Probable mild emphysematous changes of the lungs and chronic bronchitic changes. There is  blunting of the costophrenic angles which may be related to scarring. Trace pleural effusion is not excluded. There is no focal consolidation, there is no focal consolidation or pneumothorax. There is mild cardiomegaly. Left pectoral AICD device. No acute osseous pathology. IMPRESSION: 1. No acute cardiopulmonary process. 2. Chronic mild interstitial coarsening and bronchitic changes. Electronically Signed   By: Anner Crete M.D.   On: 06/04/2018 01:35   Disposition   Pt is being discharged home today in good condition.  Follow-up Plans & Appointments    Follow-up Information    [facility] Follow up on 06/27/2018.   Specialty:  Cardiology Why:  Your follow up appointment will be on 06/27/18 at 12pm in the HF clinic.   Contact information: [address] [id number] [place] [phone]       [place] [facility] Follow up.   Specialty:  Cardiology Why:  Please go to the [facility] office for a lab draw within the next week.  Contact information: [address] [place] [phone]         Discharge Instructions    ([place]) Call MD:  Anytime you have any of the following symptoms: 1) 3 pound weight gain in 24 hours or 5 pounds in 1 week 2) shortness of breath, with or without a dry hacking cough 3) swelling in the hands, feet or stomach 4) if you have to sleep on extra pillows at night in order to breathe.   Complete by:  As directed    Call MD for:  persistant nausea and vomiting   Complete by:  As directed    Diet - low sodium heart healthy   Complete by:  As directed    Discharge instructions   Complete by:  As directed    Please go to our [facility] office location within one week for a lab draw to check your kidney function.   Your Lasix was increased to 73m daily  Please stop your Entresto and Spironolactone until [facility]).   Please follow these special instructions:  1. Follow a low-salt diet - you are allowed no more than 2,0064mof sodium per day. Watch your fluid intake. In general, you should not be taking in more than 2 liters of fluid per day (no more than 8 glasses per day). This includes sources of water in foods like soup, coffee, tea, milk, etc. 2. Weigh yourself on the same scale at same time of day and keep a log. 3. Call your doctor: (Anytime you feel any of the following symptoms)  - 3lb weight gain overnight or 5lb within a few days - Shortness of breath, with or without a dry hacking cough  - Swelling in the hands, feet or stomach  - If you have to sleep on extra pillows at night in order to breathe    IT IS IMPORTANT TO LET YOUR DOCTOR KNOW EARLY ON IF YOU ARE HAVING SYMPTOMS SO WE CAN HELP YOU!   Increase activity slowly   Complete by:  As directed      Discharge Medications   Allergies as of 06/08/2018      Reactions   Prednisone Shortness Of Breath, Swelling, Rash, Other (See Comments)   Sore throat, also   Zebeta [bisoprolol Fumarate] Other (See Comments)   Caused confusion   Eggs Or Egg-derived Products Other (  See Comments)   Was told she is allergic to EGG WHITES   Statins Other (See Comments)   Gradually tired with daily Lipitor, made pt feel badly (Pt can take Crestor 5 mg 4 times per week and Zetia 5 mg two times a week)      Medication List    STOP taking these medications   amoxicillin-clavulanate 875-125 MG tablet Commonly known as:  AUGMENTIN   sacubitril-valsartan 49-51 MG Commonly known as:  ENTRESTO   spironolactone 25 MG tablet Commonly known as:  ALDACTONE     TAKE these medications   aspirin 81 MG tablet Take 81 mg by mouth daily.   azelastine 0.1 % nasal spray Commonly known as:  ASTELIN Place 1 spray into both nostrils daily.   citalopram 20 MG tablet Commonly known as:  CELEXA Take 20 mg by mouth daily.   clonazePAM 0.5 MG tablet Commonly known as:  KLONOPIN Take 0.25-0.5 mg by mouth 2 (two) times daily as needed for anxiety.   [employer] B12 PO Take 1 capsule by mouth as directed.   ezetimibe 10 MG tablet Commonly known as:  ZETIA TAKE 1/2 TABLET TWICE A WEEK What changed:  See the new instructions.   fexofenadine 180 MG tablet Commonly known as:  ALLEGRA Take 180 mg by mouth daily as needed for allergies.   fish oil-omega-3 fatty acids 1000 MG capsule Take 2 g by mouth daily.   fluticasone 50 MCG/ACT nasal spray Commonly known as:  FLONASE Place 2 sprays into both nostrils daily.   furosemide 80 MG tablet Commonly known as:  LASIX Take 1 tablet (80 mg total) by mouth daily. Start taking on:  06/09/2018 What changed:     medication strength  See the new instructions.   IRON PO Take 1 capsule by mouth as directed.   isosorbide mononitrate 30 MG 24 hr tablet Commonly known as:  IMDUR Take 30 mg by mouth daily.   metolazone 2.5 MG tablet Commonly known as:  ZAROXOLYN Take 1 tablet (2.5 mg total) by mouth as needed (swelling/wt gain.). Only take when instructed by HF clinic.   nitroGLYCERIN 0.4 MG SL tablet Commonly known as:  NITROSTAT Place 1 tablet (0.4 mg total) under the tongue every 5 (five) minutes as needed for chest pain.   omeprazole 40 MG capsule Commonly known as:  PRILOSEC Take 40 mg by mouth daily.   polyethylene glycol packet Commonly known as:  MIRALAX / GLYCOLAX Take 17 g by mouth at bedtime.   PROAIR HFA 108 (90 Base) MCG/ACT inhaler Generic drug:  albuterol Inhale 2 puffs into the lungs every 6 (six) hours as needed for wheezing.   rosuvastatin 5 MG tablet Commonly known as:  CRESTOR Take 1 tablet by mouth on Monday, Wednesday, and Friday   SYMBICORT 160-4.5 MCG/ACT inhaler Generic drug:  budesonide-formoterol Inhale 2 puffs into the lungs 2 (two) times daily.   TYLENOL ARTHRITIS PAIN 650 MG CR tablet Generic drug:  acetaminophen Take 650 mg by mouth every 8 (eight) hours as needed for pain.        Acute coronary syndrome (MI, NSTEMI, STEMI, etc) this admission?: No.    Outstanding Labs/Studies   BMET within one week. Order has been placed   Appointments made   Duration of Discharge Encounter   Greater than 30 minutes including physician time.  Signed, [place], NP 06/08/2018, 9:04 AM

## 2018-06-08 NOTE — Progress Notes (Signed)
Progress Note  Patient Name: [name] Date of Encounter: 06/08/2018  Primary Cardiologist: Dr. Johnsie Cancel, Dr. Haroldine Laws, Dr. Rayann Heman  Subjective   Patient seen this AM. Feeling tired but no chest pain, breathing improved, swelling improved. She feels ready to go home soon. Reviewed med changes, she feels comfortable and had no concerns. No lightheadedness or dizziness with ambulating.  Inpatient Medications    Scheduled Meds: . aspirin  81 mg Oral Daily  . azelastine  1 spray Each Nare Daily  . citalopram  20 mg Oral Daily  . ezetimibe  10 mg Oral Daily  . fluticasone furoate-vilanterol  1 puff Inhalation Daily  . furosemide  80 mg Oral Daily  . heparin  5,000 Units Subcutaneous Q8H  . isosorbide mononitrate  30 mg Oral Daily  . nicotine  7 mg Transdermal Daily  . pantoprazole  40 mg Oral Daily  . polyethylene glycol  17 g Oral QHS  . rosuvastatin  5 mg Oral Daily  . sodium chloride flush  3 mL Intravenous Q12H   Continuous Infusions: . sodium chloride     PRN Meds: sodium chloride, acetaminophen, albuterol, clonazePAM, ondansetron (ZOFRAN) IV, sodium chloride flush   Vital Signs    Vitals:   06/07/18 1500 06/07/18 2011 06/08/18 0450 06/08/18 0735  BP: 103/62 91/61 104/66   Pulse: 85 87 84   Resp: 18 18 17    Temp: 98 F (36.7 C) 98 F (36.7 C) 98.2 F (36.8 C)   TempSrc: Oral Oral Oral   SpO2: 98% 97% 98% 96%  Weight:   128 lb 8 oz (58.3 kg)   Height:        Intake/Output Summary (Last 24 hours) at 06/08/2018 0757 Last data filed at 06/08/2018 0453 Gross per 24 hour  Intake 600 ml  Output 2575 ml  Net -1975 ml   Filed Weights   06/06/18 0500 06/07/18 0510 06/08/18 0450  Weight: 133 lb 8 oz (60.6 kg) 130 lb 12.8 oz (59.3 kg) 128 lb 8 oz (58.3 kg)    Physical Exam   General: Well developed, well nourished, NAD Skin: Warm, dry, intact  Head: Normocephalic, atraumatic, sclera non-icteric, moist mucus membranes. Neck: Negative for carotid bruits. No  JVD Lungs:Breathing comfortably, improved clarity of lungs and clears with cough, no wheezing. Cardiovascular: RRR with S1 S2. No murmurs, rubs, gallops, or LV heave appreciated. Abdomen: Soft, non-tender, non-distended with normoactive bowel sounds. No obvious abdominal masses. MSK: Ambulating independently Extremities: trace edema bilaterally Neuro: Alert and oriented. No focal deficits. No facial asymmetry. MAE spontaneously. Psych: Responds to questions appropriately with normal affect.    Labs    Chemistry Recent Labs  Lab 06/03/18 2016 06/04/18 0701  06/06/18 0502 06/07/18 0350 06/08/18 0452  NA 125* 125*   < > 126* 131* 136  K 4.4 4.6   < > 3.9 3.5 4.3  CL 93* 94*   < > 91* 97* 97*  CO2 21* 20*   < > 24 28 31   GLUCOSE 116* 88   < > 98 117* 100*  BUN 22 22   < > 38* 39* 35*  CREATININE 2.04* 1.85*   < > 1.92* 1.84* 1.78*  CALCIUM 9.2 8.8*   < > 8.9 8.4* 9.3  PROT 6.1* 5.6*  --   --   --   --   ALBUMIN 3.2* 2.9*  --   --   --   --   AST 37 77*  --   --   --   --  ALT 49* 81*  --   --   --   --   ALKPHOS 90 81  --   --   --   --   BILITOT 1.0 1.0  --   --   --   --   GFRNONAA 24* 27*   < > 26* 27* 28*  GFRAA 27* 31*   < > 30* 31* 32*  ANIONGAP 11 11   < > 11 6 8    < > = values in this interval not displayed.     Hematology Recent Labs  Lab 06/03/18 2016 06/04/18 0701 06/05/18 1006  WBC 7.4 5.6 6.5  RBC 3.72* 3.60* 3.63*  HGB 11.0* 10.7* 10.9*  HCT 34.7* 33.8* 33.7*  MCV 93.3 93.9 92.8  MCH 29.6 29.7 30.0  MCHC 31.7 31.7 32.3  RDW 16.8* 16.9* 16.7*  PLT 138* 126* 138*    Cardiac Enzymes Recent Labs  Lab 06/04/18 0132  TROPONINI <0.03   No results for input(s): TROPIPOC in the last 168 hours.   BNP Recent Labs  Lab 06/04/18 0132  BNP 3,135.7*     DDimer No results for input(s): DDIMER in the last 168 hours.   Radiology    No results found.  Telemetry    NSR - Personally Reviewed  ECG    No new tracings as of 06/08/18 - Personally  Reviewed  Cardiac Studies   Echocardiogram 06/05/18: Study Conclusions  - Left ventricle: The cavity size was moderately dilated. Wall thickness was normal. The estimated ejection fraction was = 20%. Diffuse hypokinesis. Doppler parameters are consistent with restrictive physiology, indicative of decreased left ventricular diastolic compliance and/or increased left atrial pressure. Doppler parameters are consistent with high ventricular filling pressure. - Aortic valve: Valve area (VTI): 2.42 cm^2. Valve area (Vmax): 2.46 cm^2. - Mitral valve: There was moderate regurgitation. - Left atrium: The atrium was severely dilated. - Right ventricle: The cavity size was mildly to moderately dilated. - Right atrium: The atrium was severely dilated. - Tricuspid valve: There was severe regurgitation. - Pulmonary arteries: Systolic pressure was moderately increased. PA peak pressure: 50 mm Hg (S).  Patient Profile     67 y.o. female with a history of ischemic cardiomyopathy, systolic heart failure (WLNL89%), coronary artery disease, prior anterior MI, moderate mitral regurgitation, PVD, HLD, tobacco abuse, AICD placed, LBBB  Assessment & Plan    1. Acute on chronic systolic heart failure: -Echocardiogram 01/2018 with LVEF of 20-25% with diffuse hypokinesis, restrictive diastolic function and moderate MR -Fluid volume overloaded on exam at admission, has diuresed well on 80 mg IV lasix. Was on 40 mg PO lasix at home, will change to 80 mg PO lasix today and plan for that as homegoing dose. -Metolazone, Entresto and aldactone held secondary to acute>>>chronic AKI. Creatinine has improved with diuresis, suggestion congestion. Was only using metolazone PRN at home, will continue this plan (she calls the office prior to taking a dose). Entresto (was 49-51mg ) and aldactone (was 25 mg) still on hold, and will not tolerate currently due to her blood pressures. We discussed that she  should have close follow up in heart failure clinic post discharge to monitor her volume, check labs, and if blood pressure allows restart entresto and/or spironolactone. She agrees with this.  -Weight,128lb today, 134lb on admission. Wt at office visit 129.4 on 05/10/18.  -I&O,net negative 5.7L, Total output 2.5L yesterday   -K has been stable on minimal supplementation of 20 mEq with 80 mg IV twice daily  dosing. Do not think she will need K supplementation on lower home dose. Follow with BMET post discharge. -Imdur 30mg  daily  2.Acute on chronic kidney disease stage III: -Creatinine, improving >>1.78 today, 1.84 yesterday  -Baseline appears to be in the 1.2-1.5 range -Holding nephrotoxic medications  -Likely secondary to acute vascular congestion and fluid volume overload  3. Hyponatremia: -Improved, 136 today>>>125 on admission -likely secondary to hypervolemia. Follow up with BMET post discharge  4. Known LBBB: -Per chart review, consider CRT upgrade if recurrent HF admissions>>>as OP once stabilized   5. CAD with prior anterior MI with ischemic cardiomyopathy: -Denies chest pain or associated symptoms  -Single chamber AICD placed in 2010 -Continue ASA, statin  6. Hx of PVD: -PVD followed by Dr Fletcher Anon ABI 0.96 on right and 0.69 on leftangio on March 2019 showed 60-70% left proximal SFA with distal occlusion with collaterals and 3 vessel run off -ASA, statin  [facility] will sign off in anticipation of discharge. Medication Recommendations:  Change furosemide to 80 mg daily. Hold entresto and spironolactone. Metolazone was PRN upon weight gain and call to the office, continue this as PRN only.  Other recommendations (labs, testing, etc):  none Follow up as an outpatient:  Follow up with PCP in 1 week or sooner for BMET and BP. Follow up with HF clinic within 1-2 weeks.  TIME SPENT WITH PATIENT: >35 minutes of direct patient care. More than 50% of that time was spent  on coordination of care and counseling regarding heart failure, hospitalization summary, and discharge planning.  Buford Dresser, MD, PhD [facility]  For questions or updates, please contact   Please consult [email or web address] for contact info under Cardiology/STEMI.

## 2018-06-10 ENCOUNTER — Ambulatory Visit (INDEPENDENT_AMBULATORY_CARE_PROVIDER_SITE_OTHER): Payer: Medicare Other

## 2018-06-10 ENCOUNTER — Telehealth (HOSPITAL_COMMUNITY): Payer: Self-pay

## 2018-06-10 DIAGNOSIS — Z95810 Presence of automatic (implantable) cardiac defibrillator: Secondary | ICD-10-CM | POA: Diagnosis not present

## 2018-06-10 DIAGNOSIS — I5022 Chronic systolic (congestive) heart failure: Secondary | ICD-10-CM

## 2018-06-10 NOTE — Telephone Encounter (Signed)
2nd attempt to reach patient unsuccessful

## 2018-06-10 NOTE — Telephone Encounter (Signed)
Patient called to report 2-3 lb weight gain overnight (127 lb to 129.5 lbs) with increased SOB, nausea, and not feeling well generally starting today. Per today's Optivol, fluid up. Per Oda Kilts PA-C, patient should increase lasix to 80 mg twice daily for two days, then reduce back to normal daily dose of 80 mg once daily until seen at post hosp f/u on the 22nd as scheduled. Attempted to call patient to relay instructions, no answer, VM not set up to leave message.  Will attempt to call back again later today.  Renee Pain, RN

## 2018-06-10 NOTE — Progress Notes (Signed)
EPIC Encounter for ICM Monitoring  Patient Name: [name] is a 67 y.o. female Date: 06/10/2018 Primary Care Physican: [place], [facility] Cardiologist: Nishan/Bensimhon Electrophysiologist: Allred Dry Weight:129.5lbs(weighs daily)             Spoke with patient.  She notified HF clinic today of symptoms. She reported 2-3 lb weight gain overnight (127 lb to 129.5 lbs) with increased SOB, nausea, and not feeling well generally starting today.          Hospitalization 06/03/2018 to 06/08/2018 and dx with HF exacerbation.   Thoracic impedance abnormal suggesting fluid accumulation starting March/2019 but did improve after being diuresed during hospitalization 06/03/18.  Per HF clinic, it is thought [name] may not be accurate due to it does not correlate with ReDS vests reading when in the office.   Prescribed dosage: Furosemide 20 mg 2 tablets (40 mg total) by mouth daily. May take additional 20 mg tablet as needed.     Labs: 06/08/2018 Creatinine 1.78, BUN 35, Potassium 4.3, Sodium 136, EGFR 28-32 06/07/2018 Creatinine 1.84, BUN 39, Potassium 3.5, Sodium 131, EGFR 27-31  06/06/2018 Creatinine 1.92, BUN 38, Potassium 3.9, Sodium 126, EGFR 26-30  06/05/2018 Creatinine 2.10, BUN 36, Potassium 4.8, Sodium 123, EGFR 23-27  06/04/2018 Creatinine 1.85, BUN 22, Potassium 4.6, Sodium 125, EGFR 27-31  06/03/2018 Creatinine 2.04, BUN 22, Potassium 4.4, Sodium 125, EGFR 24-27  04/18/2018 Creatinine 1.28, BUN 21, Potassium 4.0, Sodium 136, EGFR 42-48  03/21/2018 Creatinine 1.11, BUN 10, Potassium 4.3, Sodium 137, EGFR 49-57 BNP 726.9 02/22/2018 Creatinine0.92, BUN13, Potassium4.6, Sodium139, [id number] 02/09/2018 Creatinine1.02, BUN12, Potassium4.2, Sodium135, EGFR55->60  01/17/2018 Creatinine1.32, BUN32, Potassium4.9, Sodium132, EGFR40-47  01/16/2018 Creatinine1.54, BUN35, Potassium3.9, Sodium134, BULA45-36  01/15/2018 Creatinine1.27, BUN23,  Potassium4.1, Sodium134, IWOE32-12  01/14/2018 Creatinine1.9, BUN17, Potassium4.1, [id number], CWUG89-16 11/26/2017 Creatinine 1.08, BUN 16, Potassium 4.6, Sodium 134, EGFR 52-60 07/30/2017 Creatinine 1.24, BUN 14, Potassium 4.6, Sodium 136, EGFR 44-51 03/26/2017 Creatinine 1.08, BUN 18, Potassium 4.0, Sodium 138, EGFR 52-60  Recommendations:  HF clinic has attempted call to patient today regarding orders by Oda Kilts, PA HF clinic.  Advised patient of the ordere which were to increase Furosemide to 80 mg twice a day for 2 days and then return to 80 mg daily until her 06/27/2018 appointment. She repeated instructions back correctly.    Follow-up plan: ICM clinic phone appointment on 06/16/2018 to recheck fluid levels.  Office appointment scheduled 06/27/2018 with HF clinic NP/PA and 07/19/2018 with Dr Haroldine Laws   Copy of ICM check sent to Dr. Rayann Heman, Dr. Haroldine Laws and Vilinda Blanks, RN HF clinic.   3 month ICM trend: 06/09/2018    1 Year ICM trend:       Rosalene Billings, RN 06/10/2018 7:54 AM

## 2018-06-14 NOTE — Telephone Encounter (Signed)
Pt left VM c/o 2lb weight gain in two days. Weight 7/77 was 128lbs 5oz today pts weight is 130lbs. Attempted to reach patient by phone on both numbers listed in chart. No answer unable to leave VM on one number the other number is a non working number.

## 2018-06-15 ENCOUNTER — Telehealth (HOSPITAL_COMMUNITY): Payer: Self-pay

## 2018-06-15 ENCOUNTER — Inpatient Hospital Stay (HOSPITAL_COMMUNITY)
Admission: EM | Admit: 2018-06-15 | Discharge: 2018-06-28 | DRG: 286 | Disposition: A | Discharge status: Home / Self Care | Payer: Medicare Other | Attending: Internal Medicine | Admitting: Internal Medicine

## 2018-06-15 ENCOUNTER — Other Ambulatory Visit: Payer: Self-pay

## 2018-06-15 ENCOUNTER — Emergency Department (HOSPITAL_COMMUNITY): Payer: Medicare Other

## 2018-06-15 ENCOUNTER — Encounter (HOSPITAL_COMMUNITY): Payer: Self-pay | Admitting: *Deleted

## 2018-06-15 DIAGNOSIS — R112 Nausea with vomiting, unspecified: Secondary | ICD-10-CM

## 2018-06-15 DIAGNOSIS — E871 Hypo-osmolality and hyponatremia: Secondary | ICD-10-CM | POA: Diagnosis present

## 2018-06-15 DIAGNOSIS — I447 Left bundle-branch block, unspecified: Secondary | ICD-10-CM | POA: Diagnosis present

## 2018-06-15 DIAGNOSIS — F17210 Nicotine dependence, cigarettes, uncomplicated: Secondary | ICD-10-CM | POA: Diagnosis present

## 2018-06-15 DIAGNOSIS — N183 Chronic kidney disease, stage 3 unspecified: Secondary | ICD-10-CM | POA: Diagnosis present

## 2018-06-15 DIAGNOSIS — I2510 Atherosclerotic heart disease of native coronary artery without angina pectoris: Secondary | ICD-10-CM | POA: Diagnosis present

## 2018-06-15 DIAGNOSIS — K219 Gastro-esophageal reflux disease without esophagitis: Secondary | ICD-10-CM | POA: Diagnosis present

## 2018-06-15 DIAGNOSIS — K7460 Unspecified cirrhosis of liver: Secondary | ICD-10-CM | POA: Diagnosis present

## 2018-06-15 DIAGNOSIS — I130 Hypertensive heart and chronic kidney disease with heart failure and stage 1 through stage 4 chronic kidney disease, or unspecified chronic kidney disease: Principal | ICD-10-CM | POA: Diagnosis present

## 2018-06-15 DIAGNOSIS — K449 Diaphragmatic hernia without obstruction or gangrene: Secondary | ICD-10-CM | POA: Diagnosis present

## 2018-06-15 DIAGNOSIS — R1011 Right upper quadrant pain: Secondary | ICD-10-CM

## 2018-06-15 DIAGNOSIS — E785 Hyperlipidemia, unspecified: Secondary | ICD-10-CM | POA: Diagnosis present

## 2018-06-15 DIAGNOSIS — D696 Thrombocytopenia, unspecified: Secondary | ICD-10-CM | POA: Diagnosis not present

## 2018-06-15 DIAGNOSIS — E860 Dehydration: Secondary | ICD-10-CM | POA: Diagnosis present

## 2018-06-15 DIAGNOSIS — I252 Old myocardial infarction: Secondary | ICD-10-CM

## 2018-06-15 DIAGNOSIS — D638 Anemia in other chronic diseases classified elsewhere: Secondary | ICD-10-CM | POA: Diagnosis present

## 2018-06-15 DIAGNOSIS — R188 Other ascites: Secondary | ICD-10-CM | POA: Diagnosis present

## 2018-06-15 DIAGNOSIS — I5082 Biventricular heart failure: Secondary | ICD-10-CM | POA: Diagnosis present

## 2018-06-15 DIAGNOSIS — K644 Residual hemorrhoidal skin tags: Secondary | ICD-10-CM | POA: Diagnosis present

## 2018-06-15 DIAGNOSIS — N179 Acute kidney failure, unspecified: Secondary | ICD-10-CM | POA: Diagnosis present

## 2018-06-15 DIAGNOSIS — K921 Melena: Secondary | ICD-10-CM | POA: Diagnosis not present

## 2018-06-15 DIAGNOSIS — J439 Emphysema, unspecified: Secondary | ICD-10-CM | POA: Diagnosis present

## 2018-06-15 DIAGNOSIS — Z95810 Presence of automatic (implantable) cardiac defibrillator: Secondary | ICD-10-CM | POA: Diagnosis present

## 2018-06-15 DIAGNOSIS — R109 Unspecified abdominal pain: Secondary | ICD-10-CM | POA: Diagnosis present

## 2018-06-15 DIAGNOSIS — R7989 Other specified abnormal findings of blood chemistry: Secondary | ICD-10-CM | POA: Diagnosis present

## 2018-06-15 DIAGNOSIS — D509 Iron deficiency anemia, unspecified: Secondary | ICD-10-CM | POA: Diagnosis present

## 2018-06-15 DIAGNOSIS — E876 Hypokalemia: Secondary | ICD-10-CM | POA: Diagnosis present

## 2018-06-15 DIAGNOSIS — K625 Hemorrhage of anus and rectum: Secondary | ICD-10-CM | POA: Diagnosis present

## 2018-06-15 DIAGNOSIS — R945 Abnormal results of liver function studies: Secondary | ICD-10-CM | POA: Diagnosis present

## 2018-06-15 DIAGNOSIS — K766 Portal hypertension: Secondary | ICD-10-CM | POA: Diagnosis present

## 2018-06-15 DIAGNOSIS — Z720 Tobacco use: Secondary | ICD-10-CM | POA: Diagnosis present

## 2018-06-15 DIAGNOSIS — I255 Ischemic cardiomyopathy: Secondary | ICD-10-CM | POA: Diagnosis present

## 2018-06-15 DIAGNOSIS — I248 Other forms of acute ischemic heart disease: Secondary | ICD-10-CM | POA: Diagnosis present

## 2018-06-15 DIAGNOSIS — R778 Other specified abnormalities of plasma proteins: Secondary | ICD-10-CM | POA: Diagnosis present

## 2018-06-15 DIAGNOSIS — I5043 Acute on chronic combined systolic (congestive) and diastolic (congestive) heart failure: Secondary | ICD-10-CM

## 2018-06-15 DIAGNOSIS — F329 Major depressive disorder, single episode, unspecified: Secondary | ICD-10-CM | POA: Diagnosis present

## 2018-06-15 DIAGNOSIS — I10 Essential (primary) hypertension: Secondary | ICD-10-CM | POA: Diagnosis present

## 2018-06-15 DIAGNOSIS — R935 Abnormal findings on diagnostic imaging of other abdominal regions, including retroperitoneum: Secondary | ICD-10-CM

## 2018-06-15 DIAGNOSIS — I472 Ventricular tachycardia: Secondary | ICD-10-CM | POA: Diagnosis not present

## 2018-06-15 DIAGNOSIS — K648 Other hemorrhoids: Secondary | ICD-10-CM | POA: Diagnosis present

## 2018-06-15 DIAGNOSIS — I5023 Acute on chronic systolic (congestive) heart failure: Secondary | ICD-10-CM | POA: Diagnosis present

## 2018-06-15 DIAGNOSIS — J45909 Unspecified asthma, uncomplicated: Secondary | ICD-10-CM | POA: Diagnosis present

## 2018-06-15 DIAGNOSIS — I2729 Other secondary pulmonary hypertension: Secondary | ICD-10-CM | POA: Diagnosis present

## 2018-06-15 DIAGNOSIS — N200 Calculus of kidney: Secondary | ICD-10-CM | POA: Diagnosis present

## 2018-06-15 LAB — HEPATIC FUNCTION PANEL
ALBUMIN: 3 g/dL — AB (ref 3.5–5.0)
ALT: 289 U/L — ABNORMAL HIGH (ref 0–44)
AST: 267 U/L — ABNORMAL HIGH (ref 15–41)
Alkaline Phosphatase: 85 U/L (ref 38–126)
BILIRUBIN INDIRECT: 1 mg/dL — AB (ref 0.3–0.9)
Bilirubin, Direct: 0.4 mg/dL — ABNORMAL HIGH (ref 0.0–0.2)
Total Bilirubin: 1.4 mg/dL — ABNORMAL HIGH (ref 0.3–1.2)
Total Protein: 6.1 g/dL — ABNORMAL LOW (ref 6.5–8.1)

## 2018-06-15 LAB — LIPASE, BLOOD: LIPASE: 57 U/L — AB (ref 11–51)

## 2018-06-15 LAB — BASIC METABOLIC PANEL
Anion gap: 15 (ref 5–15)
BUN: 39 mg/dL — AB (ref 8–23)
CHLORIDE: 90 mmol/L — AB (ref 98–111)
CO2: 22 mmol/L (ref 22–32)
Calcium: 8.9 mg/dL (ref 8.9–10.3)
Creatinine, Ser: 1.69 mg/dL — ABNORMAL HIGH (ref 0.44–1.00)
GFR calc non Af Amer: 30 mL/min — ABNORMAL LOW (ref >60)
GFR, EST AFRICAN AMERICAN: 35 mL/min — AB (ref >60)
Glucose, Bld: 168 mg/dL — ABNORMAL HIGH (ref 70–99)
POTASSIUM: 3.3 mmol/L — AB (ref 3.5–5.1)
SODIUM: 127 mmol/L — AB (ref 135–145)

## 2018-06-15 LAB — CBC
HEMATOCRIT: 35.3 % — AB (ref 36.0–46.0)
Hemoglobin: 11.3 g/dL — ABNORMAL LOW (ref 12.0–15.0)
MCH: 29.1 pg (ref 26.0–34.0)
MCHC: 32 g/dL (ref 30.0–36.0)
MCV: 91 fL (ref 78.0–100.0)
PLATELETS: 143 10*3/uL — AB (ref 150–400)
RBC: 3.88 MIL/uL (ref 3.87–5.11)
RDW: 17.2 % — ABNORMAL HIGH (ref 11.5–15.5)
WBC: 8.2 10*3/uL (ref 4.0–10.5)

## 2018-06-15 LAB — TROPONIN I: Troponin I: 0.04 ng/mL (ref <0.03)

## 2018-06-15 LAB — BRAIN NATRIURETIC PEPTIDE: B NATRIURETIC PEPTIDE 5: 2878.7 pg/mL — AB (ref 0.0–100.0)

## 2018-06-15 LAB — SAMPLE TO BLOOD BANK

## 2018-06-15 LAB — POC OCCULT BLOOD, ED: FECAL OCCULT BLD: NEGATIVE

## 2018-06-15 MED ORDER — ONDANSETRON HCL 4 MG/2ML IJ SOLN
4.0000 mg | Freq: Once | INTRAMUSCULAR | Status: AC
  Administered 2018-06-15: 4 mg via INTRAVENOUS
  Filled 2018-06-15: qty 2

## 2018-06-15 MED ORDER — MAGNESIUM OXIDE 400 (241.3 MG) MG PO TABS
800.0000 mg | ORAL_TABLET | Freq: Once | ORAL | Status: AC
  Administered 2018-06-15: 800 mg via ORAL
  Filled 2018-06-15: qty 2

## 2018-06-15 MED ORDER — POTASSIUM CHLORIDE CRYS ER 20 MEQ PO TBCR
40.0000 meq | EXTENDED_RELEASE_TABLET | Freq: Once | ORAL | Status: AC
  Administered 2018-06-15: 40 meq via ORAL
  Filled 2018-06-15: qty 2

## 2018-06-15 MED ORDER — METOCLOPRAMIDE HCL 5 MG/ML IJ SOLN
5.0000 mg | Freq: Once | INTRAMUSCULAR | Status: AC
  Administered 2018-06-15: 5 mg via INTRAVENOUS
  Filled 2018-06-15: qty 2

## 2018-06-15 MED ORDER — FUROSEMIDE 10 MG/ML IJ SOLN
80.0000 mg | Freq: Once | INTRAMUSCULAR | Status: AC
  Administered 2018-06-15: 80 mg via INTRAVENOUS
  Filled 2018-06-15: qty 8

## 2018-06-15 NOTE — ED Triage Notes (Signed)
The pt is  C/o bright red blood from the rectum since Sunday  Hx chf

## 2018-06-15 NOTE — Discharge Instructions (Signed)
Double your lasix for the next three days.  Return for worsening.  Call your cardiologist in the morning.

## 2018-06-15 NOTE — ED Notes (Signed)
Patient transported to CT 

## 2018-06-15 NOTE — Telephone Encounter (Signed)
Patient calling CHF clinic triage line c/o continued issues with weight up 2-3 lbs.  States usually 127-128, now is at 130.5 lbs. Patient c/o new onset blood in stool x 3 days with dark stools, bright red blood at times, and increased fatigue and weakness. Advised to report to ED for further evaluation.  Renee Pain, RN

## 2018-06-15 NOTE — ED Provider Notes (Signed)
N, V x 4 days Heme neg, stable hgb ?LE edema - got lasix - better CT abd ?GB Pending [place] Dbl lasix x 3 days  Abd [place] RUQ:   IMPRESSION: 1. Ascites. 2. Gallbladder wall thickening attributable to ascites without corroborated findings of acute cholecystitis. 3. 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  US findings show no gall stones. Possible ascites without pericholecystic fluid. Review of labs show elevated transaminases and slightly elevated total bilirubin.   11:00 - Recheck of the patient: RUQ abdominal tenderness, she reports worse since ultrasound. Continuous nausea.   Will discuss findings with general surgery for further recommendation.  Discussed patient condition and findings with Dr. Dema Severin of general surgery who advises HIDA scan will be necessary to determine cholecystitis vs heart failure cirrhosis. Will admit to hospitalist, surgery to consult on the patient and follow.  Patient and family agreeable to admission.   Charlann Lange, PA-C 06/15/18 [place], [place], DO 06/16/18 1201

## 2018-06-15 NOTE — ED Notes (Signed)
The pt has swollen feet and ankles hx chf  Pale color of skin

## 2018-06-15 NOTE — Telephone Encounter (Signed)
Patient left VM on CHF clinic triage line stating still having issues with fluid and weight up. Attempted to return call to patient on both lines listed in computer, both lines "off-line", no ring, message that states call cannot be completed at this time.  Renee Pain, RN

## 2018-06-15 NOTE — ED Provider Notes (Addendum)
[facility] EMERGENCY DEPARTMENT Provider Note   CSN: [id number] Arrival date & time: 06/15/18  1536     History   Chief Complaint Chief Complaint  Patient presents with  . Rectal Bleeding    HPI [name] is a 67 y.o. female.  67 yo F with a rectal bleeding.  This is been bright red going on for the past 3 days.  She has had about 1 bowel movement today.  She has been having worsening fatigue and shortness of breath as well.  She has a history of CHF and is gained about 8 pounds.  She denies any worsening orthopnea or PND.  She has had some significant lower extremity edema.  She is in the hospital for similar shortness of breath and was discharged after losing about 10 pounds.  Patient is also complaining of abdominal pain.  Describes this as diffuse and crampy throughout the abdomen.  She has had some nausea but denies vomiting.  Denies diarrhea.  Denies dysuria increased frequency or hesitancy.  The history is provided by the patient.  Rectal Bleeding  Quality:  Bright red Amount:  Moderate Duration:  3 days Timing:  Constant Chronicity:  New Similar prior episodes: no   Relieved by:  Nothing Worsened by:  Nothing Ineffective treatments:  None tried Associated symptoms: no dizziness, no fever and no vomiting     Past Medical History:  Diagnosis Date  . Arthritis    hands  . Asthma   . CAD (coronary artery disease)    a. s/p prior Ant MI, b. s/p prior POBA to LAD, Dx, RCA,;  c.  LHC (10/12):  dLAD 40, pCFX 30, OM1 50, pRCA 30;  d.  [facility] Myoview (11/14):  High risk study; inferior, anterior, apical defects; minimal reversibility toward the apex; consistent with prior infarct and very mild peri-infarct ischemia, EF 24%, inferior/apical/anterior akinesis  . Chronic systolic heart failure ([place])   . Depression   . H/O hiatal hernia   . HLD (hyperlipidemia)   . HTN (hypertension)   . Ischemic cardiomyopathy    MRI (12/09):  High scar burden,  Inf and Apical AK, EF 24%.;  Echo (10/14): EF 15%, Gr 2 DD, mild to mod MR, mod LAE, RVSF mildly reduced, mod RAE, severe TR, PASP 49-53 (mod pulmonary HTN)  . MI (myocardial infarction) ([place])   . Tobacco abuse     Patient Active Problem List   Diagnosis Date Noted  . Hyponatremia 06/08/2018  . AKI (acute kidney injury) ([place])   . Acute CHF (congestive heart failure) ([place]) 06/04/2018  . Acute systolic heart failure ([place]) 06/04/2018  . Dizziness 07/30/2017  . Generalized anxiety disorder 04/08/2016  . Tenosynovitis, de Quervain 04/08/2016  . CKD (chronic kidney disease) stage 3, GFR 30-59 ml/min (HCC) 03/31/2016  . Allergic rhinitis 02/06/2016  . Disease of nasal cavity and sinuses 02/06/2016  . Cardiomyopathy ([place]) 02/06/2016  . Asthma 02/06/2016  . Diaphragmatic hernia 02/06/2016  . Dyslipidemia 02/06/2016  . GERD (gastroesophageal reflux disease) 02/06/2016  . Primary snoring 02/06/2016  . CAD (coronary artery disease) 02/06/2016  . Intermittent claudication ([place]) 08/07/2014  . Bilateral carotid artery stenosis 08/07/2014  . Stenosis of carotid artery 08/07/2014  . Precordial chest pain 07/25/2014  . Chronic systolic heart failure ([place]) 10/10/2013  . Chronic obstructive pulmonary disease ([place]) 10/10/2013  . Tobacco use 10/10/2013  . Hyperlipidemia 06/22/2013  . Leg pain 06/05/2013  . Implantable cardioverter-defibrillator (ICD) in situ 09/17/2011  . Congestive heart  failure ([place]) 08/14/2011  . Tobacco abuse 01/30/2011  . Other symptoms involving cardiovascular system 01/30/2011  . Depression 05/01/2009  . Essential hypertension 05/01/2009  . MYOCARDIAL INFARCTION 05/01/2009  . Coronary atherosclerosis 05/01/2009  . CARDIOMYOPATHY, ISCHEMIC 05/01/2009  . Chronic ischemic heart disease 05/01/2009    Past Surgical History:  Procedure Laterality Date  . ABDOMINAL AORTOGRAM W/LOWER EXTREMITY N/A 03/02/2018   Procedure: ABDOMINAL AORTOGRAM W/LOWER EXTREMITY;  Surgeon: Wellington Hampshire, MD;  Location: [facility];  Service: Cardiovascular;  Laterality: N/A;  . CARDIAC DEFIBRILLATOR PLACEMENT  01/18/2009   MDT single chamber ICD implanted by Dr Rayann Heman   . cardiac stents    . COLONOSCOPY  03/18/2012   Procedure: COLONOSCOPY;  Surgeon: Beryle Beams, MD;  Location: [facility];  Service: Endoscopy;  Laterality: N/A;  . CORONARY ANGIOPLASTY    . ESOPHAGOGASTRODUODENOSCOPY N/A 03/10/2013   Procedure: ESOPHAGOGASTRODUODENOSCOPY (EGD);  Surgeon: Beryle Beams, MD;  Location: [facility];  Service: Endoscopy;  Laterality: N/A;  . LEFT HEART CATHETERIZATION WITH CORONARY ANGIOGRAM N/A 07/26/2014   Procedure: LEFT HEART CATHETERIZATION WITH CORONARY ANGIOGRAM;  Surgeon: Jolaine Artist, MD;  Location: [facility] CATH LAB;  Service: Cardiovascular;  Laterality: N/A;     OB History   None      Home Medications    Prior to Admission medications   Medication Sig Start Date End Date Taking? Authorizing Provider  acetaminophen (TYLENOL ARTHRITIS PAIN) 650 MG CR tablet Take 650 mg by mouth every 8 (eight) hours as needed for pain.    Yes [provider]  albuterol (PROAIR HFA) 108 (90 Base) MCG/ACT inhaler Inhale 2 puffs into the lungs every 6 (six) hours as needed for wheezing. 01/12/18  Yes [provider]  aspirin 81 MG tablet Take 81 mg by mouth daily.     Yes [provider]  azelastine (ASTELIN) 0.1 % nasal spray Place 1 spray into both nostrils daily.    Yes [provider]  budesonide-formoterol (SYMBICORT) 160-4.5 MCG/ACT inhaler Inhale 2 puffs into the lungs daily.  10/14/17  Yes [provider]  citalopram (CELEXA) 20 MG tablet Take 20 mg by mouth daily. 12/16/17  Yes [provider]  Cyanocobalamin ([employer] B12 PO) Take 1 capsule by mouth as directed.   Yes [provider]  ezetimibe (ZETIA) 10 MG tablet TAKE 1/2 TABLET TWICE A WEEK Patient taking differently: TAKE 1/2 TABLET TWICE A WEEK ON SAT AND SUN  07/02/17  Yes Bensimhon, Shaune Pascal, MD  fexofenadine (ALLEGRA) 180 MG tablet Take 180 mg by mouth daily as needed for allergies.    Yes [provider]  fish oil-omega-3 fatty acids 1000 MG capsule Take 2 g by mouth daily.    Yes [provider]  fluticasone (FLONASE) 50 MCG/ACT nasal spray Place 2 sprays into both nostrils daily.   Yes [provider]  furosemide (LASIX) 80 MG tablet Take 1 tablet (80 mg total) by mouth daily. 06/09/18  Yes Kathyrn Drown D, NP  IRON PO Take 1 capsule by mouth as directed.   Yes [provider]  isosorbide mononitrate (IMDUR) 30 MG 24 hr tablet Take 30 mg by mouth daily.   Yes [provider]  metolazone (ZAROXOLYN) 2.5 MG tablet Take 1 tablet (2.5 mg total) by mouth as needed (swelling/wt gain.). Only take when instructed by HF clinic. 06/01/18  Yes Georgiana Shore, NP  nitroGLYCERIN (NITROSTAT) 0.4 MG SL tablet Place 1 tablet (0.4 mg total) under the tongue  every 5 (five) minutes as needed for chest pain. 10/28/16  Yes Josue Hector, MD  omeprazole (PRILOSEC) 40 MG capsule Take 40 mg by mouth daily.    Yes [provider]  polyethylene glycol (MIRALAX / GLYCOLAX) packet Take 17 g by mouth at bedtime.   Yes [provider]  rosuvastatin (CRESTOR) 5 MG tablet Take 1 tablet by mouth on Monday, Wednesday, and Friday 04/29/18  Yes Wellington Hampshire, MD  Budesonide (PULMICORT IN) Inhale into the lungs as directed.    02/29/12  [provider]    Family History No family history on file.  Social History Social History   Tobacco Use  . Smoking status: Current Every Day Smoker    Types: Cigarettes  . Smokeless tobacco: Never Used  Substance Use Topics  . Alcohol use: No  . Drug use: No     Allergies   Prednisone; Zebeta [bisoprolol fumarate]; Eggs or egg-derived products; and Statins   Review of Systems Review of Systems  Constitutional: Negative for chills and fever.  HENT: Negative  for congestion and rhinorrhea.   Eyes: Negative for redness and visual disturbance.  Respiratory: Positive for shortness of breath. Negative for wheezing.   Cardiovascular: Negative for chest pain and palpitations.  Gastrointestinal: Positive for blood in stool and hematochezia. Negative for nausea and vomiting.  Genitourinary: Negative for dysuria and urgency.  Musculoskeletal: Negative for arthralgias and myalgias.  Skin: Negative for pallor and wound.  Neurological: Negative for dizziness and headaches.     Physical Exam Updated Vital Signs BP 101/70   Pulse 94   Temp 98.9 F (37.2 C) (Oral)   Resp (!) 25   Ht 5' 3.5" (1.613 m)   Wt 56.2 kg (124 lb)   SpO2 100%   BMI 21.62 kg/m   Physical Exam  Constitutional: She is oriented to person, place, and time. She appears well-developed and well-nourished. No distress.  HENT:  Head: Normocephalic and atraumatic.  Eyes: Pupils are equal, round, and reactive to light. EOM are normal.  Neck: Normal range of motion. Neck supple. JVD ( To the angle of the jaw bilaterally) present.  Cardiovascular: Normal rate and regular rhythm. Exam reveals no gallop and no friction rub.  No murmur heard. Pulmonary/Chest: Effort normal. She has no wheezes. She has rales.  Abdominal: Soft. She exhibits no distension. There is no tenderness.  Musculoskeletal: She exhibits edema (4+ to the thighs). She exhibits no tenderness.  Neurological: She is alert and oriented to person, place, and time.  Skin: Skin is warm and dry. She is not diaphoretic.  Psychiatric: She has a normal mood and affect. Her behavior is normal.  Nursing note and vitals reviewed.    ED Treatments / Results  Labs (all labs ordered are listed, but only abnormal results are displayed) Labs Reviewed  BASIC METABOLIC PANEL - Abnormal; Notable for the following components:      Result Value   Sodium 127 (*)    Potassium 3.3 (*)    Chloride 90 (*)    Glucose, Bld 168 (*)    BUN  39 (*)    Creatinine, Ser 1.69 (*)    GFR calc non Af Amer 30 (*)    GFR calc Af Amer 35 (*)    All other components within normal limits  CBC - Abnormal; Notable for the following components:   Hemoglobin 11.3 (*)    HCT 35.3 (*)    RDW 17.2 (*)    Platelets 143 (*)  All other components within normal limits  TROPONIN I - Abnormal; Notable for the following components:   Troponin I 0.04 (*)    All other components within normal limits  BRAIN NATRIURETIC PEPTIDE - Abnormal; Notable for the following components:   B Natriuretic Peptide 2,878.7 (*)    All other components within normal limits  HEPATIC FUNCTION PANEL - Abnormal; Notable for the following components:   Total Protein 6.1 (*)    Albumin 3.0 (*)    AST 267 (*)    ALT 289 (*)    Total Bilirubin 1.4 (*)    Bilirubin, Direct 0.4 (*)    Indirect Bilirubin 1.0 (*)    All other components within normal limits  LIPASE, BLOOD - Abnormal; Notable for the following components:   Lipase 57 (*)    All other components within normal limits  POC OCCULT BLOOD, ED  SAMPLE TO BLOOD BANK    EKG EKG Interpretation  Date/Time:  Wednesday June 15 2018 16:02:43 EDT Ventricular Rate:  96 PR Interval:  172 QRS Duration: 132 QT Interval:  372 QTC Calculation: 469 R Axis:   -128 Text Interpretation:  Sinus rhythm with sinus arrhythmia with occasional Premature ventricular complexes and Fusion complexes Possible Left atrial enlargement Non-specific intra-ventricular conduction block Possible Anterolateral infarct , age undetermined Abnormal ECG No significant change since last tracing Confirmed by Deno Etienne [phone]) on 06/15/2018 4:53:53 PM   Radiology Ct Abdomen Pelvis Wo Contrast  Result Date: 06/15/2018 CLINICAL DATA:  Blood in stool.  Abdominal pain. EXAM: CT ABDOMEN AND PELVIS WITHOUT CONTRAST TECHNIQUE: Multidetector CT imaging of the abdomen and pelvis was performed following the standard protocol without IV contrast.  COMPARISON:  CT chest 04/19/2018. FINDINGS: Lower chest: Lung bases show mild dependent atelectasis. Heart is enlarged. No pericardial or pleural effusion. Distal esophagus is grossly unremarkable. Hepatobiliary: Liver is unremarkable. Gallbladder wall thickening versus pericholecystic fluid. No biliary ductal dilatation. Pancreas: Negative. Spleen: Negative. Adrenals/Urinary Tract: Right adrenal gland is unremarkable. There may be thickening of the left adrenal gland. Punctate stone in the right kidney. Ureters are decompressed. Bladder is grossly unremarkable. Stomach/Bowel: Stomach, small bowel and colon are unremarkable. Appendix not readily visualized. Vascular/Lymphatic: Atherosclerotic calcification of the arterial vasculature without abdominal aortic aneurysm. No pathologically enlarged lymph nodes. Reproductive: Uterus is visualized.  No adnexal mass. Other: Moderate ascites. No free air. Presacral edema. Mesenteries and peritoneum are otherwise unremarkable. Musculoskeletal: Degenerative changes in the spine. IMPRESSION: 1. Gallbladder wall thickening versus pericholecystic fluid. If there are symptoms localized to the right upper quadrant, ultrasound may be helpful in further evaluation, as clinically indicated. 2. Moderate ascites. 3.  Aortic atherosclerosis (ICD10-170.0). 4. Punctate right renal stone. Electronically Signed   By: Lorin Picket M.D.   On: 06/15/2018 19:57   Dg Chest 2 View  Result Date: 06/15/2018 CLINICAL DATA:  Bright red rectal bleeding since Sunday, abdominal pain, on aspirin, history CHF, asthma, hypertension, ischemic cardiomyopathy, prior MI and coronary stenting, smoker EXAM: CHEST - 2 VIEW COMPARISON:  06/04/2018 FINDINGS: LEFT subclavian AICD lead tip projects over LEFT ventricle unchanged. Enlargement of cardiac silhouette with pulmonary vascular congestion. Atherosclerotic calcification aorta. Stable mediastinal contours. Lungs mildly hyperinflated but clear. No acute  infiltrate, pleural effusion or pneumothorax. Bones demineralized. IMPRESSION: Enlargement of cardiac silhouette with pulmonary vascular congestion. No acute abnormalities. Electronically Signed   By: Lavonia Dana M.D.   On: 06/15/2018 16:47    Procedures Procedures (including critical care time)  Medications Ordered in ED Medications  potassium  chloride SA (K-DUR,KLOR-CON) CR tablet 40 mEq (40 mEq Oral Given 06/15/18 1857)  magnesium oxide (MAG-OX) tablet 800 mg (800 mg Oral Given 06/15/18 1906)  furosemide (LASIX) injection 80 mg (80 mg Intravenous Given 06/15/18 1857)  ondansetron (ZOFRAN) injection 4 mg (4 mg Intravenous Given 06/15/18 1920)     Initial Impression / Assessment and Plan / ED Course  I have reviewed the triage vital signs and the nursing notes.  Pertinent labs & imaging results that were available during my care of the patient were reviewed by me and considered in my medical decision making (see chart for details).     67 yo F chief complaint of bright red blood per rectum.  Patient's hemoglobin is actually better than it was when she was in the hospital about a week ago.  Will obtain a rectal exam.  Patient is also complaining of what sounds like a CHF exacerbation.  Clinically she appears fluid overloaded her BNP is elevated but less than when she came to the hospital last time.  We will give a dose of Lasix replenish the patient's potassium and magnesium.  Patient also has significant abdominal pain on my exam.  Will obtain a CT scan.  CT scan with possible fluid surrounding the gallbladder.  Repeat abdominal exam with no focal tenderness about the gallbladder however the patient is complaining of nausea and vomiting for the past couple days and not able to tolerate p.o.  She has not had any vomiting here.  Her breathing is significantly improved with a of IV Lasix.  She was able to ambulate with no hypoxia.  CRITICAL CARE Performed by: Cecilio Asper   Total  critical care time: 35 minutes  Critical care time was exclusive of separately billable procedures and treating other patients.  Critical care was necessary to treat or prevent imminent or life-threatening deterioration.  Critical care was time spent personally by me on the following activities: development of treatment plan with patient and/or surrogate as well as nursing, discussions with consultants, evaluation of patient's response to treatment, examination of patient, obtaining history from patient or surrogate, ordering and performing treatments and interventions, ordering and review of laboratory studies, ordering and review of radiographic studies, pulse oximetry and re-evaluation of patient's condition.   Turned over to [employer], please see her note for further details of care.   The patients results and plan were reviewed and discussed.   Any x-rays performed were independently reviewed by myself.   Differential diagnosis were considered with the presenting HPI.  Medications  potassium chloride SA (K-DUR,KLOR-CON) CR tablet 40 mEq (40 mEq Oral Given 06/15/18 1857)  magnesium oxide (MAG-OX) tablet 800 mg (800 mg Oral Given 06/15/18 1906)  furosemide (LASIX) injection 80 mg (80 mg Intravenous Given 06/15/18 1857)  ondansetron (ZOFRAN) injection 4 mg (4 mg Intravenous Given 06/15/18 1920)    Vitals:   06/15/18 1930 06/15/18 2000 06/15/18 2015 06/15/18 2130  BP: 111/72 113/71 108/72 101/70  Pulse: 94 95 94 94  Resp: (!) 23 (!) 21 (!) 23 (!) 25  Temp:      TempSrc:      SpO2: 100% 100% 99% 100%  Weight:      Height:        Final diagnoses:  Abdominal pain, RUQ  Acute on chronic combined systolic and diastolic congestive heart failure (HCC)  Nausea and vomiting in adult      Final Clinical Impressions(s) / ED Diagnoses   Final diagnoses:  Abdominal pain,  RUQ  Acute on chronic combined systolic and diastolic congestive heart failure (HCC)  Nausea and vomiting in  adult    ED Discharge Orders    None       Deno Etienne, DO 06/15/18 2219    Deno Etienne, DO 06/28/18 1500

## 2018-06-15 NOTE — ED Notes (Signed)
Pt. Ambulated to and from bathroom. Sats were between 98-100%. Pt. Complained of slight dizziness but gait was okay. No SOB reported.

## 2018-06-15 NOTE — ED Notes (Signed)
Patient transported to Ultrasound 

## 2018-06-16 ENCOUNTER — Encounter (HOSPITAL_COMMUNITY): Payer: Self-pay | Admitting: Internal Medicine

## 2018-06-16 ENCOUNTER — Other Ambulatory Visit: Payer: Self-pay

## 2018-06-16 ENCOUNTER — Inpatient Hospital Stay (HOSPITAL_COMMUNITY): Payer: Medicare Other

## 2018-06-16 DIAGNOSIS — R1084 Generalized abdominal pain: Secondary | ICD-10-CM | POA: Diagnosis not present

## 2018-06-16 DIAGNOSIS — I5043 Acute on chronic combined systolic (congestive) and diastolic (congestive) heart failure: Secondary | ICD-10-CM | POA: Diagnosis present

## 2018-06-16 DIAGNOSIS — Z720 Tobacco use: Secondary | ICD-10-CM

## 2018-06-16 DIAGNOSIS — R7989 Other specified abnormal findings of blood chemistry: Secondary | ICD-10-CM

## 2018-06-16 DIAGNOSIS — I493 Ventricular premature depolarization: Secondary | ICD-10-CM | POA: Diagnosis not present

## 2018-06-16 DIAGNOSIS — N179 Acute kidney failure, unspecified: Secondary | ICD-10-CM | POA: Diagnosis present

## 2018-06-16 DIAGNOSIS — R945 Abnormal results of liver function studies: Secondary | ICD-10-CM | POA: Diagnosis present

## 2018-06-16 DIAGNOSIS — R748 Abnormal levels of other serum enzymes: Secondary | ICD-10-CM

## 2018-06-16 DIAGNOSIS — E785 Hyperlipidemia, unspecified: Secondary | ICD-10-CM

## 2018-06-16 DIAGNOSIS — E876 Hypokalemia: Secondary | ICD-10-CM | POA: Diagnosis present

## 2018-06-16 DIAGNOSIS — D509 Iron deficiency anemia, unspecified: Secondary | ICD-10-CM

## 2018-06-16 DIAGNOSIS — I2510 Atherosclerotic heart disease of native coronary artery without angina pectoris: Secondary | ICD-10-CM

## 2018-06-16 DIAGNOSIS — K7460 Unspecified cirrhosis of liver: Secondary | ICD-10-CM | POA: Diagnosis present

## 2018-06-16 DIAGNOSIS — I130 Hypertensive heart and chronic kidney disease with heart failure and stage 1 through stage 4 chronic kidney disease, or unspecified chronic kidney disease: Secondary | ICD-10-CM | POA: Diagnosis present

## 2018-06-16 DIAGNOSIS — K644 Residual hemorrhoidal skin tags: Secondary | ICD-10-CM | POA: Diagnosis present

## 2018-06-16 DIAGNOSIS — E871 Hypo-osmolality and hyponatremia: Secondary | ICD-10-CM | POA: Diagnosis present

## 2018-06-16 DIAGNOSIS — K921 Melena: Secondary | ICD-10-CM | POA: Diagnosis present

## 2018-06-16 DIAGNOSIS — I361 Nonrheumatic tricuspid (valve) insufficiency: Secondary | ICD-10-CM | POA: Diagnosis not present

## 2018-06-16 DIAGNOSIS — I5023 Acute on chronic systolic (congestive) heart failure: Secondary | ICD-10-CM | POA: Diagnosis not present

## 2018-06-16 DIAGNOSIS — N170 Acute kidney failure with tubular necrosis: Secondary | ICD-10-CM | POA: Diagnosis not present

## 2018-06-16 DIAGNOSIS — R109 Unspecified abdominal pain: Secondary | ICD-10-CM | POA: Diagnosis present

## 2018-06-16 DIAGNOSIS — N183 Chronic kidney disease, stage 3 (moderate): Secondary | ICD-10-CM | POA: Diagnosis present

## 2018-06-16 DIAGNOSIS — K648 Other hemorrhoids: Secondary | ICD-10-CM | POA: Diagnosis present

## 2018-06-16 DIAGNOSIS — I248 Other forms of acute ischemic heart disease: Secondary | ICD-10-CM | POA: Diagnosis present

## 2018-06-16 DIAGNOSIS — K766 Portal hypertension: Secondary | ICD-10-CM | POA: Diagnosis present

## 2018-06-16 DIAGNOSIS — I509 Heart failure, unspecified: Secondary | ICD-10-CM | POA: Diagnosis not present

## 2018-06-16 DIAGNOSIS — R188 Other ascites: Secondary | ICD-10-CM | POA: Diagnosis present

## 2018-06-16 DIAGNOSIS — J45909 Unspecified asthma, uncomplicated: Secondary | ICD-10-CM | POA: Diagnosis present

## 2018-06-16 DIAGNOSIS — R778 Other specified abnormalities of plasma proteins: Secondary | ICD-10-CM | POA: Diagnosis present

## 2018-06-16 DIAGNOSIS — I255 Ischemic cardiomyopathy: Secondary | ICD-10-CM | POA: Diagnosis present

## 2018-06-16 DIAGNOSIS — R1011 Right upper quadrant pain: Secondary | ICD-10-CM | POA: Diagnosis not present

## 2018-06-16 DIAGNOSIS — Z95810 Presence of automatic (implantable) cardiac defibrillator: Secondary | ICD-10-CM | POA: Diagnosis not present

## 2018-06-16 DIAGNOSIS — K449 Diaphragmatic hernia without obstruction or gangrene: Secondary | ICD-10-CM | POA: Diagnosis present

## 2018-06-16 DIAGNOSIS — I472 Ventricular tachycardia: Secondary | ICD-10-CM | POA: Diagnosis not present

## 2018-06-16 DIAGNOSIS — K219 Gastro-esophageal reflux disease without esophagitis: Secondary | ICD-10-CM | POA: Diagnosis present

## 2018-06-16 DIAGNOSIS — F17210 Nicotine dependence, cigarettes, uncomplicated: Secondary | ICD-10-CM | POA: Diagnosis present

## 2018-06-16 DIAGNOSIS — F329 Major depressive disorder, single episode, unspecified: Secondary | ICD-10-CM | POA: Diagnosis present

## 2018-06-16 DIAGNOSIS — I5082 Biventricular heart failure: Secondary | ICD-10-CM | POA: Diagnosis present

## 2018-06-16 DIAGNOSIS — R1010 Upper abdominal pain, unspecified: Secondary | ICD-10-CM | POA: Diagnosis not present

## 2018-06-16 DIAGNOSIS — K625 Hemorrhage of anus and rectum: Secondary | ICD-10-CM | POA: Diagnosis present

## 2018-06-16 DIAGNOSIS — I2729 Other secondary pulmonary hypertension: Secondary | ICD-10-CM | POA: Diagnosis present

## 2018-06-16 LAB — URINALYSIS, ROUTINE W REFLEX MICROSCOPIC
BILIRUBIN URINE: NEGATIVE
Glucose, UA: NEGATIVE mg/dL
Hgb urine dipstick: NEGATIVE
Ketones, ur: NEGATIVE mg/dL
Nitrite: NEGATIVE
PH: 5 (ref 5.0–8.0)
Protein, ur: NEGATIVE mg/dL
Specific Gravity, Urine: 1.009 (ref 1.005–1.030)

## 2018-06-16 LAB — CBC
HCT: 36.9 % (ref 36.0–46.0)
Hemoglobin: 11.6 g/dL — ABNORMAL LOW (ref 12.0–15.0)
MCH: 29.1 pg (ref 26.0–34.0)
MCHC: 31.4 g/dL (ref 30.0–36.0)
MCV: 92.7 fL (ref 78.0–100.0)
PLATELETS: 136 10*3/uL — AB (ref 150–400)
RBC: 3.98 MIL/uL (ref 3.87–5.11)
RDW: 17.3 % — ABNORMAL HIGH (ref 11.5–15.5)
WBC: 6.7 10*3/uL (ref 4.0–10.5)

## 2018-06-16 LAB — COMPREHENSIVE METABOLIC PANEL
ALT: 384 U/L — AB (ref 0–44)
AST: 388 U/L — AB (ref 15–41)
Albumin: 3.1 g/dL — ABNORMAL LOW (ref 3.5–5.0)
Alkaline Phosphatase: 83 U/L (ref 38–126)
Anion gap: 12 (ref 5–15)
BUN: 41 mg/dL — AB (ref 8–23)
CHLORIDE: 92 mmol/L — AB (ref 98–111)
CO2: 26 mmol/L (ref 22–32)
CREATININE: 1.88 mg/dL — AB (ref 0.44–1.00)
Calcium: 9.2 mg/dL (ref 8.9–10.3)
GFR calc Af Amer: 30 mL/min — ABNORMAL LOW (ref >60)
GFR, EST NON AFRICAN AMERICAN: 26 mL/min — AB (ref >60)
Glucose, Bld: 98 mg/dL (ref 70–99)
POTASSIUM: 4.6 mmol/L (ref 3.5–5.1)
Sodium: 130 mmol/L — ABNORMAL LOW (ref 135–145)
Total Bilirubin: 1.4 mg/dL — ABNORMAL HIGH (ref 0.3–1.2)
Total Protein: 6 g/dL — ABNORMAL LOW (ref 6.5–8.1)

## 2018-06-16 LAB — ABO/RH: ABO/RH(D): A POS

## 2018-06-16 LAB — APTT: aPTT: 30 seconds (ref 24–36)

## 2018-06-16 LAB — LIPID PANEL
CHOLESTEROL: 69 mg/dL (ref 0–200)
HDL: 20 mg/dL — ABNORMAL LOW (ref >40)
LDL CALC: 39 mg/dL (ref 0–99)
Total CHOL/HDL Ratio: 3.5 RATIO
Triglycerides: 49 mg/dL (ref <150)
VLDL: 10 mg/dL (ref 0–40)

## 2018-06-16 LAB — TROPONIN I
TROPONIN I: 0.03 ng/mL — AB (ref <0.03)
TROPONIN I: 0.03 ng/mL — AB (ref <0.03)
Troponin I: 0.04 ng/mL (ref <0.03)

## 2018-06-16 LAB — TYPE AND SCREEN
ABO/RH(D): A POS
ANTIBODY SCREEN: NEGATIVE

## 2018-06-16 LAB — HEMOGLOBIN A1C
HEMOGLOBIN A1C: 6.2 % — AB (ref 4.8–5.6)
MEAN PLASMA GLUCOSE: 131.24 mg/dL

## 2018-06-16 LAB — PROTIME-INR
INR: 1.39
Prothrombin Time: 17 seconds — ABNORMAL HIGH (ref 11.4–15.2)

## 2018-06-16 LAB — TSH: TSH: 4.353 u[IU]/mL (ref 0.350–4.500)

## 2018-06-16 LAB — AMMONIA: AMMONIA: 24 umol/L (ref 9–35)

## 2018-06-16 LAB — GLUCOSE, CAPILLARY: Glucose-Capillary: 111 mg/dL — ABNORMAL HIGH (ref 70–99)

## 2018-06-16 LAB — CREATININE, URINE, RANDOM: CREATININE, URINE: 80.9 mg/dL

## 2018-06-16 MED ORDER — FAMOTIDINE 20 MG PO TABS
20.0000 mg | ORAL_TABLET | Freq: Every day | ORAL | Status: DC
  Administered 2018-06-16 – 2018-06-28 (×13): 20 mg via ORAL
  Filled 2018-06-16 (×13): qty 1

## 2018-06-16 MED ORDER — MORPHINE SULFATE (PF) 4 MG/ML IV SOLN: 1.0000 mg | INTRAVENOUS | Status: DC | PRN

## 2018-06-16 MED ORDER — TECHNETIUM TC 99M MEBROFENIN IV KIT
5.0000 | PACK | Freq: Once | INTRAVENOUS | Status: AC | PRN
  Administered 2018-06-16: 5 via INTRAVENOUS

## 2018-06-16 MED ORDER — ACETAMINOPHEN 325 MG PO TABS
650.0000 mg | ORAL_TABLET | ORAL | Status: DC | PRN
  Administered 2018-06-16 – 2018-06-26 (×19): 650 mg via ORAL
  Filled 2018-06-16 (×19): qty 2

## 2018-06-16 MED ORDER — NITROGLYCERIN 0.4 MG SL SUBL: 0.4000 mg | SUBLINGUAL_TABLET | SUBLINGUAL | Status: DC | PRN

## 2018-06-16 MED ORDER — LORATADINE 10 MG PO TABS
10.0000 mg | ORAL_TABLET | Freq: Every day | ORAL | Status: DC
  Administered 2018-06-16 – 2018-06-28 (×13): 10 mg via ORAL
  Filled 2018-06-16 (×13): qty 1

## 2018-06-16 MED ORDER — AZELASTINE HCL 0.1 % NA SOLN
1.0000 | Freq: Every day | NASAL | Status: DC
  Administered 2018-06-16 – 2018-06-27 (×10): 1 via NASAL
  Filled 2018-06-16 (×2): qty 30

## 2018-06-16 MED ORDER — HYDRALAZINE HCL 20 MG/ML IJ SOLN: 5.0000 mg | INTRAMUSCULAR | Status: DC | PRN

## 2018-06-16 MED ORDER — POLYETHYLENE GLYCOL 3350 17 G PO PACK
17.0000 g | PACK | Freq: Every day | ORAL | Status: DC | PRN
  Administered 2018-06-17 – 2018-06-27 (×7): 17 g via ORAL
  Filled 2018-06-16 (×7): qty 1

## 2018-06-16 MED ORDER — ONDANSETRON HCL 4 MG/2ML IJ SOLN
4.0000 mg | Freq: Three times a day (TID) | INTRAMUSCULAR | Status: DC | PRN
  Administered 2018-06-16 – 2018-06-17 (×3): 4 mg via INTRAVENOUS
  Filled 2018-06-16 (×3): qty 2

## 2018-06-16 MED ORDER — FERROUS SULFATE 325 (65 FE) MG PO TABS
325.0000 mg | ORAL_TABLET | Freq: Every day | ORAL | Status: DC
  Administered 2018-06-16 – 2018-06-28 (×13): 325 mg via ORAL
  Filled 2018-06-16 (×13): qty 1

## 2018-06-16 MED ORDER — ROSUVASTATIN CALCIUM 5 MG PO TABS: 5.0000 mg | ORAL_TABLET | ORAL | Status: DC

## 2018-06-16 MED ORDER — CITALOPRAM HYDROBROMIDE 20 MG PO TABS
20.0000 mg | ORAL_TABLET | Freq: Every day | ORAL | Status: DC
  Administered 2018-06-16 – 2018-06-28 (×13): 20 mg via ORAL
  Filled 2018-06-16 (×13): qty 1

## 2018-06-16 MED ORDER — MOMETASONE FURO-FORMOTEROL FUM 200-5 MCG/ACT IN AERO
2.0000 | INHALATION_SPRAY | Freq: Two times a day (BID) | RESPIRATORY_TRACT | Status: DC
  Administered 2018-06-16 – 2018-06-28 (×24): 2 via RESPIRATORY_TRACT
  Filled 2018-06-16 (×3): qty 8.8

## 2018-06-16 MED ORDER — EZETIMIBE 10 MG PO TABS
5.0000 mg | ORAL_TABLET | ORAL | Status: DC
  Administered 2018-06-20 – 2018-06-27 (×3): 5 mg via ORAL
  Filled 2018-06-16 (×3): qty 1

## 2018-06-16 MED ORDER — ISOSORBIDE MONONITRATE ER 30 MG PO TB24
30.0000 mg | ORAL_TABLET | Freq: Every day | ORAL | Status: DC
  Administered 2018-06-16 – 2018-06-28 (×13): 30 mg via ORAL
  Filled 2018-06-16 (×14): qty 1

## 2018-06-16 MED ORDER — FLUTICASONE PROPIONATE 50 MCG/ACT NA SUSP
2.0000 | Freq: Every day | NASAL | Status: DC
  Administered 2018-06-16 – 2018-06-19 (×4): 2 via NASAL
  Filled 2018-06-16 (×2): qty 16

## 2018-06-16 MED ORDER — OMEGA-3-ACID ETHYL ESTERS 1 G PO CAPS
2.0000 g | ORAL_CAPSULE | Freq: Every day | ORAL | Status: DC
  Administered 2018-06-16 – 2018-06-28 (×13): 2 g via ORAL
  Filled 2018-06-16 (×13): qty 2

## 2018-06-16 MED ORDER — ZOLPIDEM TARTRATE 5 MG PO TABS: 5.0000 mg | ORAL_TABLET | Freq: Every evening | ORAL | Status: DC | PRN

## 2018-06-16 MED ORDER — FUROSEMIDE 10 MG/ML IJ SOLN
40.0000 mg | Freq: Two times a day (BID) | INTRAMUSCULAR | Status: DC
  Administered 2018-06-16 – 2018-06-19 (×7): 40 mg via INTRAVENOUS
  Filled 2018-06-16 (×7): qty 4

## 2018-06-16 MED ORDER — NICOTINE 21 MG/24HR TD PT24
21.0000 mg | MEDICATED_PATCH | Freq: Every day | TRANSDERMAL | Status: DC
  Administered 2018-06-16 – 2018-06-28 (×12): 21 mg via TRANSDERMAL
  Filled 2018-06-16 (×14): qty 1

## 2018-06-16 MED ORDER — PANTOPRAZOLE SODIUM 40 MG PO TBEC
40.0000 mg | DELAYED_RELEASE_TABLET | Freq: Every day | ORAL | Status: DC
  Administered 2018-06-16 – 2018-06-28 (×13): 40 mg via ORAL
  Filled 2018-06-16 (×13): qty 1

## 2018-06-16 MED ORDER — CYANOCOBALAMIN 2500 MCG PO CHEW: CHEWABLE_TABLET | ORAL | Status: DC

## 2018-06-16 NOTE — Procedures (Signed)
Paged Dr. Blaine Hamper and Dr. Darrick Meigs with no response.  Echo was done 11 days ago, please contact echo department [phone]) if echo needs repeat.  Echo protocol is not to repeat if echo has been done within the past 3 months. [phone].

## 2018-06-16 NOTE — Progress Notes (Addendum)
[facility] Progress Note     Subjective: Patient still with some nausea.  States her abdominal "discomfort" improves after moving her bowels.  But still has some diffuse discomfort.  Objective: Vital signs in last 24 hours: Temp:  [97.4 F (36.3 C)-98.9 F (37.2 C)] 97.4 F (36.3 C) (07/11 0818) Pulse Rate:  [26-95] 84 (07/11 0818) Resp:  [16-27] 18 (07/11 0818) BP: (89-120)/(56-78) 103/67 (07/11 0818) SpO2:  [86 %-100 %] 96 % (07/11 1013) Weight:  [56.2 kg (124 lb)-60 kg (132 lb 4.8 oz)] 60 kg (132 lb 4.8 oz) (07/11 0212) Last BM Date: 06/15/18  Intake/Output from previous day: 07/10 0701 - 07/11 0700 In: 120 [P.O.:120] Out: 200 [Urine:200] Intake/Output this shift: No intake/output data recorded.  PE: Gen:  Alert, NAD, cooperative  Card:  Regular  Pulm:  CTAB Abd: Soft, tenderness to palpation in all quadrants, none greater than the other, +BS, ND   Lab Results:  Recent Labs    06/15/18 1616  WBC 8.2  HGB 11.3*  HCT 35.3*  PLT 143*   BMET Recent Labs    06/15/18 1616  NA 127*  K 3.3*  CL 90*  CO2 22  GLUCOSE 168*  BUN 39*  CREATININE 1.69*  CALCIUM 8.9   PT/INR Recent Labs    06/16/18 0255  LABPROT 17.0*  INR 1.39   CMP     Component Value Date/Time   NA 127 (L) 06/15/2018 1616   NA 139 02/22/2018 1100   K 3.3 (L) 06/15/2018 1616   CL 90 (L) 06/15/2018 1616   CO2 22 06/15/2018 1616   GLUCOSE 168 (H) 06/15/2018 1616   BUN 39 (H) 06/15/2018 1616   BUN 13 02/22/2018 1100   CREATININE 1.69 (H) 06/15/2018 1616   CREATININE 1.15 (H) 08/04/2016 1033   CALCIUM 8.9 06/15/2018 1616   PROT 6.1 (L) 06/15/2018 1616   ALBUMIN 3.0 (L) 06/15/2018 1616   AST 267 (H) 06/15/2018 1616   ALT 289 (H) 06/15/2018 1616   ALKPHOS 85 06/15/2018 1616   BILITOT 1.4 (H) 06/15/2018 1616   GFRNONAA 30 (L) 06/15/2018 1616   GFRNONAA >60 08/14/2011 1607   GFRAA 35 (L) 06/15/2018 1616   GFRAA >60 08/14/2011 1607   Lipase     Component Value  Date/Time   LIPASE 57 (H) 06/15/2018 1616       Studies/Results: Ct Abdomen Pelvis Wo Contrast  Result Date: 06/15/2018 CLINICAL DATA:  Blood in stool.  Abdominal pain. EXAM: CT ABDOMEN AND PELVIS WITHOUT CONTRAST TECHNIQUE: Multidetector CT imaging of the abdomen and pelvis was performed following the standard protocol without IV contrast. COMPARISON:  CT chest 04/19/2018. FINDINGS: Lower chest: Lung bases show mild dependent atelectasis. Heart is enlarged. No pericardial or pleural effusion. Distal esophagus is grossly unremarkable. Hepatobiliary: Liver is unremarkable. Gallbladder wall thickening versus pericholecystic fluid. No biliary ductal dilatation. Pancreas: Negative. Spleen: Negative. Adrenals/Urinary Tract: Right adrenal gland is unremarkable. There may be thickening of the left adrenal gland. Punctate stone in the right kidney. Ureters are decompressed. Bladder is grossly unremarkable. Stomach/Bowel: Stomach, small bowel and colon are unremarkable. Appendix not readily visualized. Vascular/Lymphatic: Atherosclerotic calcification of the arterial vasculature without abdominal aortic aneurysm. No pathologically enlarged lymph nodes. Reproductive: Uterus is visualized.  No adnexal mass. Other: Moderate ascites. No free air. Presacral edema. Mesenteries and peritoneum are otherwise unremarkable. Musculoskeletal: Degenerative changes in the spine. IMPRESSION: 1. Gallbladder wall thickening versus pericholecystic fluid. If there are symptoms localized to the right upper quadrant, ultrasound may  be helpful in further evaluation, as clinically indicated. 2. Moderate ascites. 3.  Aortic atherosclerosis (ICD10-170.0). 4. Punctate right renal stone. Electronically Signed   By: Lorin Picket M.D.   On: 06/15/2018 19:57   Dg Chest 2 View  Result Date: 06/15/2018 CLINICAL DATA:  Bright red rectal bleeding since Sunday, abdominal pain, on aspirin, history CHF, asthma, hypertension, ischemic  cardiomyopathy, prior MI and coronary stenting, smoker EXAM: CHEST - 2 VIEW COMPARISON:  06/04/2018 FINDINGS: LEFT subclavian AICD lead tip projects over LEFT ventricle unchanged. Enlargement of cardiac silhouette with pulmonary vascular congestion. Atherosclerotic calcification aorta. Stable mediastinal contours. Lungs mildly hyperinflated but clear. No acute infiltrate, pleural effusion or pneumothorax. Bones demineralized. IMPRESSION: Enlargement of cardiac silhouette with pulmonary vascular congestion. No acute abnormalities. Electronically Signed   By: Lavonia Dana M.D.   On: 06/15/2018 16:47   US Abdomen Limited Ruq  Result Date: 06/15/2018 CLINICAL DATA:  Abdominal pain for 4 days. EXAM: ULTRASOUND ABDOMEN LIMITED RIGHT UPPER QUADRANT COMPARISON:  CT abdomen and pelvis June 15, 2018. FINDINGS: Gallbladder: No echogenic gallstones. 3 mm echogenic mural base polyp present. Gallbladder wall thickened at 4 mm. No pericholecystic fluid. No sonographic Murphy sign elicited. Common bile duct: Diameter: 3 mm. Liver: No focal lesion identified. Within normal limits in parenchymal echogenicity. Echogenic granuloma. Portal vein is patent on color Doppler imaging with bidirectional color flow, pulsatile spectral waveforms. Anechoic free fluid. IMPRESSION: 1. Ascites. 2. Gallbladder wall thickening attributable to ascites without corroborated findings of acute cholecystitis. 3. 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 Electronically Signed   By: Elon Alas M.D.   On: 06/15/2018 22:43    Anti-infectives: Anti-infectives (From admission, onward)   None       Assessment/Plan HTN HLD CAD/MI CHF  CKD, stage 3 Hemorrhoids - continue fiber supplementation and drink plenty of water, minimize time sitting/straining on commode.   Abdominal pain  - clinical sxs not consistent with biliary colic or acute cholecystitis   - HIDA scan  is negative for cholecystitis.  No plans for surgical intervention.  -patient needs GI follow up for colonoscopy given new BRBPR recently as well as new nausea that started with her last admission.  FEN: no diet restrictions from our standpoint.  As tolerates VTE: SCD's,  ID: none   No plans for acute surgical intervention as patient does not have biliary colic (no gallstones) or acute cholecystitis.  We will sign off.   LOS: 0 days   Henreitta Cea 11:46 AM 06/16/2018   Agree with above. There is not obvious surgical cause for his symptoms.  Alphonsa Overall, MD, [facility] Surgery Pager: [phone] Office phone:  [phone]

## 2018-06-16 NOTE — Progress Notes (Signed)
Subjective: Patient admitted this morning, see detailed H&P by Dr Blaine Hamper 67 year old female with a history of hypertension, hyperlipidemia, asthma, GERD, depression, CAD, MI, CHF with EF 20%, tobacco abuse, CKD stage III, ICD placement, iron deficiency anemia came to hospital with nausea vomiting abdominal pain for the past 3 days.  Patient was seen by general surgery and also underwent HIDA scan which was negative for biliary disease.  Patient still complains of nausea.  She does have a history of GERD.  LFTs are still elevated.  Hepatitis panel has been obtained  Vitals:   06/16/18 1013 06/16/18 1214  BP:  104/73  Pulse:  94  Resp:  20  Temp:  97.7 F (36.5 C)  SpO2: 96% 97%      A/P Nausea, abdominal pain   CHF exacerbation Transaminitis  HIDA scan is negative, general surgery has signed off. We will start Pepcid 20 mg p.o. twice daily for nausea Also due to elevated BNP, will restart Lasix 40 mg IV every 12 hours.  Follow hepatitis panel.  Follow BMP in am.     [facility] Pager- [phone]

## 2018-06-16 NOTE — Progress Notes (Signed)
Patient complaining of leg cramps, gave mustard. Will continue to monitor and notify NP.

## 2018-06-16 NOTE — Progress Notes (Signed)
Patient with 6 beats of vtach

## 2018-06-16 NOTE — Progress Notes (Signed)
Patient arrived to unit, ambulated to bed. PAtient oriented to unit and unit protocols; updated on plan of care anf NPO status.  Call bell within reach, safety precautions in place.  Patient presents with 0/10 pain, BLE pitting edema +2.

## 2018-06-16 NOTE — Progress Notes (Signed)
Notified by tele that pt had a 5 beat run of wide QRS. MD notified. Patient offers no c/o. Awakei in bed

## 2018-06-16 NOTE — Plan of Care (Signed)
  Problem: Education: Goal: Knowledge of General Education information will improve Outcome: Progressing   Problem: Health Behavior/Discharge Planning: Goal: Ability to manage health-related needs will improve Outcome: Progressing   Problem: Clinical Measurements: Goal: Ability to maintain clinical measurements within normal limits will improve Outcome: Progressing   Problem: Clinical Measurements: Goal: Diagnostic test results will improve Outcome: Progressing   Problem: Activity: Goal: Risk for activity intolerance will decrease Outcome: Progressing   Problem: Nutrition: Goal: Adequate nutrition will be maintained Outcome: Progressing   Problem: Pain Managment: Goal: General experience of comfort will improve Outcome: Progressing   Problem: Safety: Goal: Ability to remain free from injury will improve Outcome: Progressing   Problem: Activity: Goal: Capacity to carry out activities will improve Outcome: Progressing

## 2018-06-16 NOTE — H&P (Signed)
CC: Consult by Dr. Phoebe Sharps for evaluation of possible cholecystitis  HPI: [name] is an 67 y.o. female with hx of HTN, HLD, CAD/MI, CHF recently discharged following CHF exacerbation returns for BRBPR x 3 over last 2d. She reports having had her last colonoscopy in 2013 and having a small polyp removed and being told she had internal and external hemorrhoids. She has been on daily miralax since. Not taking a fiber supplement.  She complained of diffuse crampy/achy abdominal discomfort while here as well. The pain is all over and intermittent. Nonradiating. She denies localized right sided or worse right sided abdominal pain. She denies f/c/v. +Nausea. Never had this before.  She denies any prior abdominal operations.  She denies taking any blood thinners aside from daily aspirin.  Past Medical History:  Diagnosis Date  . Arthritis    hands  . Asthma   . CAD (coronary artery disease)    a. s/p prior Ant MI, b. s/p prior POBA to LAD, Dx, RCA,;  c.  LHC (10/12):  dLAD 40, pCFX 30, OM1 50, pRCA 30;  d.  [facility] Myoview (11/14):  High risk study; inferior, anterior, apical defects; minimal reversibility toward the apex; consistent with prior infarct and very mild peri-infarct ischemia, EF 24%, inferior/apical/anterior akinesis  . Chronic systolic heart failure ([place])   . Depression   . H/O hiatal hernia   . HLD (hyperlipidemia)   . HTN (hypertension)   . Ischemic cardiomyopathy    MRI (12/09):  High scar burden, Inf and Apical AK, EF 24%.;  Echo (10/14): EF 15%, Gr 2 DD, mild to mod MR, mod LAE, RVSF mildly reduced, mod RAE, severe TR, PASP 49-53 (mod pulmonary HTN)  . MI (myocardial infarction) ([place])   . Tobacco abuse     Past Surgical History:  Procedure Laterality Date  . ABDOMINAL AORTOGRAM W/LOWER EXTREMITY N/A 03/02/2018   Procedure: ABDOMINAL AORTOGRAM W/LOWER EXTREMITY;  Surgeon: Wellington Hampshire, MD;  Location: [place] [facility];  Service: Cardiovascular;  Laterality:  N/A;  . CARDIAC DEFIBRILLATOR PLACEMENT  01/18/2009   MDT single chamber ICD implanted by Dr Rayann Heman   . cardiac stents    . COLONOSCOPY  03/18/2012   Procedure: COLONOSCOPY;  Surgeon: Beryle Beams, MD;  Location: [facility];  Service: Endoscopy;  Laterality: N/A;  . CORONARY ANGIOPLASTY    . ESOPHAGOGASTRODUODENOSCOPY N/A 03/10/2013   Procedure: ESOPHAGOGASTRODUODENOSCOPY (EGD);  Surgeon: Beryle Beams, MD;  Location: [facility];  Service: Endoscopy;  Laterality: N/A;  . LEFT HEART CATHETERIZATION WITH CORONARY ANGIOGRAM N/A 07/26/2014   Procedure: LEFT HEART CATHETERIZATION WITH CORONARY ANGIOGRAM;  Surgeon: Jolaine Artist, MD;  Location: [facility] LAB;  Service: Cardiovascular;  Laterality: N/A;    No family history on file.  Social:  reports that she has been smoking cigarettes.  She has never used smokeless tobacco. She reports that she does not drink alcohol or use drugs.  Allergies:  Allergies  Allergen Reactions  . Prednisone Shortness Of Breath, Swelling, Rash and Other (See Comments)    Sore throat, also  . Zebeta [Bisoprolol Fumarate] Other (See Comments)    Caused confusion  . Eggs Or Egg-Derived Products Other (See Comments)    Was told she is allergic to EGG WHITES  . Statins Other (See Comments)    Gradually tired with daily Lipitor, made pt feel badly (Pt can take Crestor 5 mg 4 times per week and Zetia 5 mg two times a week)    Medications: I  have reviewed the patient's current medications.  Results for orders placed or performed during the hospital encounter of 06/15/18 (from the past 48 hour(s))  Basic metabolic panel     Status: Abnormal   Collection Time: 06/15/18  4:16 PM  Result Value Ref Range   Sodium 127 (L) 135 - 145 mmol/L   Potassium 3.3 (L) 3.5 - 5.1 mmol/L   Chloride 90 (L) 98 - 111 mmol/L    Comment: Please note change in reference range.   CO2 22 22 - 32 mmol/L   Glucose, Bld 168 (H) 70 - 99 mg/dL    Comment: Please note change in  reference range.   BUN 39 (H) 8 - 23 mg/dL    Comment: Please note change in reference range.   Creatinine, Ser 1.69 (H) 0.44 - 1.00 mg/dL   Calcium 8.9 8.9 - 10.3 mg/dL   GFR calc non Af Amer 30 (L) >60 mL/min   GFR calc Af Amer 35 (L) >60 mL/min    Comment: (NOTE) The eGFR has been calculated using the CKD EPI equation. This calculation has not been validated in all clinical situations. eGFR's persistently <60 mL/min signify possible Chronic Kidney Disease.    Anion gap 15 5 - 15    Comment: Performed at [facility] [address], [place], [place] [address]  CBC     Status: Abnormal   Collection Time: 06/15/18  4:16 PM  Result Value Ref Range   WBC 8.2 4.0 - 10.5 K/uL   RBC 3.88 3.87 - 5.11 MIL/uL   Hemoglobin 11.3 (L) 12.0 - 15.0 g/dL   HCT 35.3 (L) 36.0 - 46.0 %   MCV 91.0 78.0 - 100.0 fL   MCH 29.1 26.0 - 34.0 pg   MCHC 32.0 30.0 - 36.0 g/dL   RDW 17.2 (H) 11.5 - 15.5 %   Platelets 143 (L) 150 - 400 K/uL    Comment: Performed at [facility], [place] [address], [place], [place] [address]  Troponin I     Status: Abnormal   Collection Time: 06/15/18  4:16 PM  Result Value Ref Range   Troponin I 0.04 (HH) <0.03 ng/mL    Comment: CRITICAL RESULT CALLED TO, READ BACK BY AND VERIFIED WITH: A GORTON,RN [id number] 06/15/18 D BRADLEY Performed at [facility], [place] [address], [place], [place] [address]   Hepatic function panel     Status: Abnormal   Collection Time: 06/15/18  4:16 PM  Result Value Ref Range   Total Protein 6.1 (L) 6.5 - 8.1 g/dL   Albumin 3.0 (L) 3.5 - 5.0 g/dL   AST 267 (H) 15 - 41 U/L   ALT 289 (H) 0 - 44 U/L    Comment: Please note change in reference range.   Alkaline Phosphatase 85 38 - 126 U/L   Total Bilirubin 1.4 (H) 0.3 - 1.2 mg/dL   Bilirubin, Direct 0.4 (H) 0.0 - 0.2 mg/dL    Comment: Please note change in reference range.   Indirect Bilirubin 1.0 (H) 0.3 - 0.9 mg/dL    Comment: Performed at [facility], [place] [address]  [place], [place] [address]  Lipase, blood     Status: Abnormal   Collection Time: 06/15/18  4:16 PM  Result Value Ref Range   Lipase 57 (H) 11 - 51 U/L    Comment: Performed at [facility] [address] [place], [place] [address]  Sample to Blood Bank  Status: None   Collection Time: 06/15/18  4:17 PM  Result Value Ref Range   Blood Bank Specimen SAMPLE AVAILABLE FOR TESTING    Sample Expiration      06/16/2018 Performed at [facility], [place] [address] [place], [place] [address]   Brain natriuretic peptide     Status: Abnormal   Collection Time: 06/15/18  4:17 PM  Result Value Ref Range   B Natriuretic Peptide 2,878.7 (H) 0.0 - 100.0 pg/mL    Comment: Performed at [facility] [address], [place], [place] [address]  POC occult blood, ED     Status: None   Collection Time: 06/15/18  6:51 PM  Result Value Ref Range   Fecal Occult Bld NEGATIVE NEGATIVE    Ct Abdomen Pelvis Wo Contrast  Result Date: 06/15/2018 CLINICAL DATA:  Blood in stool.  Abdominal pain. EXAM: CT ABDOMEN AND PELVIS WITHOUT CONTRAST TECHNIQUE: Multidetector CT imaging of the abdomen and pelvis was performed following the standard protocol without IV contrast. COMPARISON:  CT chest 04/19/2018. FINDINGS: Lower chest: Lung bases show mild dependent atelectasis. Heart is enlarged. No pericardial or pleural effusion. Distal esophagus is grossly unremarkable. Hepatobiliary: Liver is unremarkable. Gallbladder wall thickening versus pericholecystic fluid. No biliary ductal dilatation. Pancreas: Negative. Spleen: Negative. Adrenals/Urinary Tract: Right adrenal gland is unremarkable. There may be thickening of the left adrenal gland. Punctate stone in the right kidney. Ureters are decompressed. Bladder is grossly unremarkable. Stomach/Bowel: Stomach, small bowel and colon are unremarkable. Appendix not readily visualized. Vascular/Lymphatic: Atherosclerotic calcification of the arterial vasculature without  abdominal aortic aneurysm. No pathologically enlarged lymph nodes. Reproductive: Uterus is visualized.  No adnexal mass. Other: Moderate ascites. No free air. Presacral edema. Mesenteries and peritoneum are otherwise unremarkable. Musculoskeletal: Degenerative changes in the spine. IMPRESSION: 1. Gallbladder wall thickening versus pericholecystic fluid. If there are symptoms localized to the right upper quadrant, ultrasound may be helpful in further evaluation, as clinically indicated. 2. Moderate ascites. 3.  Aortic atherosclerosis (ICD10-170.0). 4. Punctate right renal stone. Electronically Signed   By: Lorin Picket M.D.   On: 06/15/2018 19:57   Dg Chest 2 View  Result Date: 06/15/2018 CLINICAL DATA:  Bright red rectal bleeding since Sunday, abdominal pain, on aspirin, history CHF, asthma, hypertension, ischemic cardiomyopathy, prior MI and coronary stenting, smoker EXAM: CHEST - 2 VIEW COMPARISON:  06/04/2018 FINDINGS: LEFT subclavian AICD lead tip projects over LEFT ventricle unchanged. Enlargement of cardiac silhouette with pulmonary vascular congestion. Atherosclerotic calcification aorta. Stable mediastinal contours. Lungs mildly hyperinflated but clear. No acute infiltrate, pleural effusion or pneumothorax. Bones demineralized. IMPRESSION: Enlargement of cardiac silhouette with pulmonary vascular congestion. No acute abnormalities. Electronically Signed   By: Lavonia Dana M.D.   On: 06/15/2018 16:47   US Abdomen Limited Ruq  Result Date: 06/15/2018 CLINICAL DATA:  Abdominal pain for 4 days. EXAM: ULTRASOUND ABDOMEN LIMITED RIGHT UPPER QUADRANT COMPARISON:  CT abdomen and pelvis June 15, 2018. FINDINGS: Gallbladder: No echogenic gallstones. 3 mm echogenic mural base polyp present. Gallbladder wall thickened at 4 mm. No pericholecystic fluid. No sonographic Murphy sign elicited. Common bile duct: Diameter: 3 mm. Liver: No focal lesion identified. Within normal limits in parenchymal echogenicity.  Echogenic granuloma. Portal vein is patent on color Doppler imaging with bidirectional color flow, pulsatile spectral waveforms. Anechoic free fluid. IMPRESSION: 1. Ascites. 2. Gallbladder wall thickening attributable to ascites without corroborated findings of acute cholecystitis. 3. Patent main portal vein with bidirectional flow seen with portal hypertension/cirrhosis and RIGHT heart  failure. Electronically Signed   By: Elon Alas M.D.   On: 06/15/2018 22:43    ROS - all of the below systems have been reviewed with the patient and positives are indicated with bold text General: chills, fever or night sweats Eyes: blurry vision or double vision ENT: epistaxis or sore throat Allergy/Immunology: itchy/watery eyes or nasal congestion Hematologic/Lymphatic: bleeding problems, blood clots or swollen lymph nodes Endocrine: temperature intolerance or unexpected weight changes Breast: new or changing breast lumps or nipple discharge Resp: cough, shortness of breath, or wheezing CV: chest pain or dyspnea on exertion GI: as per HPI GU: dysuria, trouble voiding, or hematuria MSK: joint pain or joint stiffness Neuro: TIA or stroke symptoms Derm: pruritus and skin lesion changes Psych: anxiety and depression  PE Blood pressure 104/78, pulse 90, temperature 98.9 F (37.2 C), temperature source Oral, resp. rate 16, height 5' 3.5" (1.613 m), weight 56.2 kg (124 lb), SpO2 100 %. Constitutional: NAD; conversant; no deformities Eyes: Moist conjunctiva; no lid lag; anicteric; PERRL Neck: Trachea midline; no thyromegaly Lungs: Normal respiratory effort; no tactile fremitus CV: RRR; no palpable thrills; no pitting edema GI: Abd soft, NT/ND; no palpable hepatosplenomegaly; Negative Murphy's sign MSK: Normal gait; no clubbing/cyanosis Psychiatric: Appropriate affect; alert and oriented x3 Lymphatic: No palpable cervical or axillary lymphadenopathy  Results for orders placed or performed during the  hospital encounter of 06/15/18 (from the past 48 hour(s))  Basic metabolic panel     Status: Abnormal   Collection Time: 06/15/18  4:16 PM  Result Value Ref Range   Sodium 127 (L) 135 - 145 mmol/L   Potassium 3.3 (L) 3.5 - 5.1 mmol/L   Chloride 90 (L) 98 - 111 mmol/L    Comment: Please note change in reference range.   CO2 22 22 - 32 mmol/L   Glucose, Bld 168 (H) 70 - 99 mg/dL    Comment: Please note change in reference range.   BUN 39 (H) 8 - 23 mg/dL    Comment: Please note change in reference range.   Creatinine, Ser 1.69 (H) 0.44 - 1.00 mg/dL   Calcium 8.9 8.9 - 10.3 mg/dL   GFR calc non Af Amer 30 (L) >60 mL/min   GFR calc Af Amer 35 (L) >60 mL/min    Comment: (NOTE) The eGFR has been calculated using the CKD EPI equation. This calculation has not been validated in all clinical situations. eGFR's persistently <60 mL/min signify possible Chronic Kidney Disease.    Anion gap 15 5 - 15    Comment: Performed at [facility] [address] [place], [place] [address]  CBC     Status: Abnormal   Collection Time: 06/15/18  4:16 PM  Result Value Ref Range   WBC 8.2 4.0 - 10.5 K/uL   RBC 3.88 3.87 - 5.11 MIL/uL   Hemoglobin 11.3 (L) 12.0 - 15.0 g/dL   HCT 35.3 (L) 36.0 - 46.0 %   MCV 91.0 78.0 - 100.0 fL   MCH 29.1 26.0 - 34.0 pg   MCHC 32.0 30.0 - 36.0 g/dL   RDW 17.2 (H) 11.5 - 15.5 %   Platelets 143 (L) 150 - 400 K/uL    Comment: Performed at [facility], [place] [address] [place], [place] [address]  Troponin I     Status: Abnormal   Collection Time: 06/15/18  4:16 PM  Result Value Ref Range   Troponin I 0.04 (HH) <0.03 ng/mL    Comment: CRITICAL RESULT CALLED  TO, READ BACK BY AND VERIFIED WITH: A GORTON,RN 1731 06/15/18 D BRADLEY Performed at [facility], [place] [address], [place], [place] [address]   Hepatic function panel     Status: Abnormal   Collection Time: 06/15/18  4:16 PM  Result Value Ref Range   Total Protein 6.1 (L) 6.5 - 8.1 g/dL    Albumin 3.0 (L) 3.5 - 5.0 g/dL   AST 267 (H) 15 - 41 U/L   ALT 289 (H) 0 - 44 U/L    Comment: Please note change in reference range.   Alkaline Phosphatase 85 38 - 126 U/L   Total Bilirubin 1.4 (H) 0.3 - 1.2 mg/dL   Bilirubin, Direct 0.4 (H) 0.0 - 0.2 mg/dL    Comment: Please note change in reference range.   Indirect Bilirubin 1.0 (H) 0.3 - 0.9 mg/dL    Comment: Performed at [facility], [place] [address] [place], [place] [address]  Lipase, blood     Status: Abnormal   Collection Time: 06/15/18  4:16 PM  Result Value Ref Range   Lipase 57 (H) 11 - 51 U/L    Comment: Performed at [facility] [address] [place], [place] [address]  Sample to Blood Bank     Status: None   Collection Time: 06/15/18  4:17 PM  Result Value Ref Range   Blood Bank Specimen SAMPLE AVAILABLE FOR TESTING    Sample Expiration      06/16/2018 Performed at [facility], [place] [address], [place], [place] [address]   Brain natriuretic peptide     Status: Abnormal   Collection Time: 06/15/18  4:17 PM  Result Value Ref Range   B Natriuretic Peptide 2,878.7 (H) 0.0 - 100.0 pg/mL    Comment: Performed at [name] [address], [place], [place] [address]  POC occult blood, ED     Status: None   Collection Time: 06/15/18  6:51 PM  Result Value Ref Range   Fecal Occult Bld NEGATIVE NEGATIVE    Ct Abdomen Pelvis Wo Contrast  Result Date: 06/15/2018 CLINICAL DATA:  Blood in stool.  Abdominal pain. EXAM: CT ABDOMEN AND PELVIS WITHOUT CONTRAST TECHNIQUE: Multidetector CT imaging of the abdomen and pelvis was performed following the standard protocol without IV contrast. COMPARISON:  CT chest 04/19/2018. FINDINGS: Lower chest: Lung bases show mild dependent atelectasis. Heart is enlarged. No pericardial or pleural effusion. Distal esophagus is grossly unremarkable. Hepatobiliary: Liver is unremarkable. Gallbladder wall thickening versus pericholecystic fluid. No biliary ductal dilatation.  Pancreas: Negative. Spleen: Negative. Adrenals/Urinary Tract: Right adrenal gland is unremarkable. There may be thickening of the left adrenal gland. Punctate stone in the right kidney. Ureters are decompressed. Bladder is grossly unremarkable. Stomach/Bowel: Stomach, small bowel and colon are unremarkable. Appendix not readily visualized. Vascular/Lymphatic: Atherosclerotic calcification of the arterial vasculature without abdominal aortic aneurysm. No pathologically enlarged lymph nodes. Reproductive: Uterus is visualized.  No adnexal mass. Other: Moderate ascites. No free air. Presacral edema. Mesenteries and peritoneum are otherwise unremarkable. Musculoskeletal: Degenerative changes in the spine. IMPRESSION: 1. Gallbladder wall thickening versus pericholecystic fluid. If there are symptoms localized to the right upper quadrant, ultrasound may be helpful in further evaluation, as clinically indicated. 2. Moderate ascites. 3.  Aortic atherosclerosis (ICD10-170.0). 4. Punctate right renal stone. Electronically Signed   By: Lorin Picket M.D.   On: 06/15/2018 19:57   Dg Chest 2 View  Result Date: 06/15/2018 CLINICAL DATA:  Bright red rectal  bleeding since Sunday, abdominal pain, on aspirin, history CHF, asthma, hypertension, ischemic cardiomyopathy, prior MI and coronary stenting, smoker EXAM: CHEST - 2 VIEW COMPARISON:  06/04/2018 FINDINGS: LEFT subclavian AICD lead tip projects over LEFT ventricle unchanged. Enlargement of cardiac silhouette with pulmonary vascular congestion. Atherosclerotic calcification aorta. Stable mediastinal contours. Lungs mildly hyperinflated but clear. No acute infiltrate, pleural effusion or pneumothorax. Bones demineralized. IMPRESSION: Enlargement of cardiac silhouette with pulmonary vascular congestion. No acute abnormalities. Electronically Signed   By: Lavonia Dana M.D.   On: 06/15/2018 16:47   US Abdomen Limited Ruq  Result Date: 06/15/2018 CLINICAL DATA:  Abdominal pain  for 4 days. EXAM: ULTRASOUND ABDOMEN LIMITED RIGHT UPPER QUADRANT COMPARISON:  CT abdomen and pelvis June 15, 2018. FINDINGS: Gallbladder: No echogenic gallstones. 3 mm echogenic mural base polyp present. Gallbladder wall thickened at 4 mm. No pericholecystic fluid. No sonographic Murphy sign elicited. Common bile duct: Diameter: 3 mm. Liver: No focal lesion identified. Within normal limits in parenchymal echogenicity. Echogenic granuloma. Portal vein is patent on color Doppler imaging with bidirectional color flow, pulsatile spectral waveforms. Anechoic free fluid. IMPRESSION: 1. Ascites. 2. Gallbladder wall thickening attributable to ascites without corroborated findings of acute cholecystitis. 3. 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 Electronically Signed   By: Elon Alas M.D.   On: 06/15/2018 22:43    A/P: [name] is an 67 y.o. female with hx of HTN, HLD, CAD/MI, CHF (last EF 20% last month) recently discharged following CHF exacerbation.  -Unlikely gallbladder as etiology of her crampy diffuse abdominal discomfort. HIDA scan has been ordered. We will follow-up this -Regarding her BRBPR, would recommend she being taking fiber supplement daily (ie metamucil), indefinitely, stay hydrated as much as possible and minimize time on commode to 2-3 minutes. She needs an outpatient referral for repeat colonoscopy as well.  Sharon Mt. Dema Severin, M.D. [facility]

## 2018-06-16 NOTE — H&P (Signed)
History and Physical    JAICEY SWEANEY OFH:[id number] DOB: 1951-09-14 DOA: 06/15/2018  Referring MD/NP/PA:   PCP: [facility]   Patient coming from:  The patient is coming from home.  At baseline, pt is independent for most of ADL.  Chief Complaint: Nausea, vomiting, abdominal pain, rectal bleeding, mild shortness of breath,  HPI: [name] is a 67 y.o. female with medical history significant of hypertension, hyperlipidemia, asthma, GERD, depression, CAD, myocardial infarction, CHF with EF 20%, tobacco abuse, CKD 3, ICD placement, iron deficiency anemia, hyponatremia, who presents with nausea vomiting, abdominal pain, rectal bleeding and mild shortness of breath.  Patient states that she has been having nausea, vomiting, abdominal pain in the past 3 days.  Mostly dry heaves, no hematemesis.  Abdominal pain is located in the right upper quadrant, constant, 6 out of 10 severity, sharp, nonradiating.  No fever or chills.  She states that she has had intermittent 1-2 times over rectal bleeding with bright red blood in the past several days.  No lightheadedness or dizziness.  Patient states that she has some mild cough, and mild shortness of breath.  She has a worsening leg edema bilaterally.  She has burning on urination sometimes, but no dysuria or increased urinary frequency.  No unilateral weakness.  ED Course: pt was found to have hemoglobin 11.2 which was 10.9 on 06/05/2018, negative FOBT, WBC 8.2, BNP 2878, troponin 0.04, abnormal liver function with AST 267, ALT 289, total bilirubin 1.4, ALP 85, sodium 127, potassium 3.3, worsening renal function, temperature normal, soft blood pressure, oxygen saturation 86% to 100% on room air.  Patient is admitted to telemetry bed as inpatient.  General surgeon, Dr. Dema Severin was consulted.  CXR showed enlargement of cardiac silhouette with pulmonary vascular congestion.  CT-abd/pelvis showed: 1. Gallbladder wall thickening  versus pericholecystic fluid. 2. Moderate ascites. 3.  Aortic atherosclerosis (ICD10-170.0). 4. Punctate right renal stone.  US-RUQ showed: 1. Ascites. 2. Gallbladder wall thickening attributable to ascites without corroborated findings of acute cholecystitis. 3. 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  Review of Systems:   General: no fevers, chills, no body weight gain, has poor appetite, has fatigue HEENT: no blurry vision, hearing changes or sore throat Respiratory: has dyspnea, coughing, no wheezing CV: no chest pain, no palpitations GI: has nausea, vomiting, abdominal pain, no diarrhea, constipation GU: no dysuria, has burning on urination, no increased urinary frequency, hematuria  Ext: has leg edema Neuro: no unilateral weakness, numbness, or tingling, no vision change or hearing loss Skin: no rash, no skin tear. MSK: No muscle spasm, no deformity, no limitation of range of movement in spin Heme: No easy bruising.  Travel history: No recent long distant travel.  Allergy:  Allergies  Allergen Reactions  . Prednisone Shortness Of Breath, Swelling, Rash and Other (See Comments)    Sore throat, also  . Zebeta [Bisoprolol Fumarate] Other (See Comments)    Caused confusion  . Eggs Or Egg-Derived Products Other (See Comments)    Was told she is allergic to EGG WHITES  . Statins Other (See Comments)    Gradually tired with daily Lipitor, made pt feel badly (Pt can take Crestor 5 mg 4 times per week and Zetia 5 mg two times a week)    Past Medical History:  Diagnosis Date  . Arthritis    hands  . Asthma   . CAD (coronary artery disease)    a.  s/p prior Ant MI, b. s/p prior POBA to LAD, Dx, RCA,;  c.  LHC (10/12):  dLAD 40, pCFX 30, OM1 50, pRCA 30;  d.  [facility] Myoview (11/14):  High risk study; inferior, anterior, apical defects; minimal reversibility toward the apex; consistent with prior infarct  and very mild peri-infarct ischemia, EF 24%, inferior/apical/anterior akinesis  . Chronic systolic heart failure ([place])   . Depression   . H/O hiatal hernia   . HLD (hyperlipidemia)   . HTN (hypertension)   . Ischemic cardiomyopathy    MRI (12/09):  High scar burden, Inf and Apical AK, EF 24%.;  Echo (10/14): EF 15%, Gr 2 DD, mild to mod MR, mod LAE, RVSF mildly reduced, mod RAE, severe TR, PASP 49-53 (mod pulmonary HTN)  . MI (myocardial infarction) ([place])   . Tobacco abuse     Past Surgical History:  Procedure Laterality Date  . ABDOMINAL AORTOGRAM W/LOWER EXTREMITY N/A 03/02/2018   Procedure: ABDOMINAL AORTOGRAM W/LOWER EXTREMITY;  Surgeon: Wellington Hampshire, MD;  Location: [place] [facility];  Service: Cardiovascular;  Laterality: N/A;  . CARDIAC DEFIBRILLATOR PLACEMENT  01/18/2009   MDT single chamber ICD implanted by Dr Rayann Heman   . cardiac stents    . COLONOSCOPY  03/18/2012   Procedure: COLONOSCOPY;  Surgeon: Beryle Beams, MD;  Location: [facility];  Service: Endoscopy;  Laterality: N/A;  . CORONARY ANGIOPLASTY    . ESOPHAGOGASTRODUODENOSCOPY N/A 03/10/2013   Procedure: ESOPHAGOGASTRODUODENOSCOPY (EGD);  Surgeon: Beryle Beams, MD;  Location: [facility];  Service: Endoscopy;  Laterality: N/A;  . LEFT HEART CATHETERIZATION WITH CORONARY ANGIOGRAM N/A 07/26/2014   Procedure: LEFT HEART CATHETERIZATION WITH CORONARY ANGIOGRAM;  Surgeon: Jolaine Artist, MD;  Location: [facility];  Service: Cardiovascular;  Laterality: N/A;    Social History:  reports that she has been smoking cigarettes.  She has never used smokeless tobacco. She reports that she does not drink alcohol or use drugs.  Family History:  Family History  Problem Relation Age of Onset  . Stroke Mother   . Diabetes Mellitus II Sister      Prior to Admission medications   Medication Sig Start Date End Date Taking? Authorizing Provider  acetaminophen (TYLENOL ARTHRITIS PAIN) 650 MG CR tablet Take 650 mg by mouth  every 8 (eight) hours as needed for pain.    Yes [provider]  albuterol (PROAIR HFA) 108 (90 Base) MCG/ACT inhaler Inhale 2 puffs into the lungs every 6 (six) hours as needed for wheezing. 01/12/18  Yes [provider]  aspirin 81 MG tablet Take 81 mg by mouth daily.     Yes [provider]  azelastine (ASTELIN) 0.1 % nasal spray Place 1 spray into both nostrils daily.    Yes [provider]  budesonide-formoterol (SYMBICORT) 160-4.5 MCG/ACT inhaler Inhale 2 puffs into the lungs daily.  10/14/17  Yes [provider]  citalopram (CELEXA) 20 MG tablet Take 20 mg by mouth daily. 12/16/17  Yes [provider]  Cyanocobalamin ([facility] B12 PO) Take 1 capsule by mouth as directed.   Yes [provider]  ezetimibe (ZETIA) 10 MG tablet TAKE 1/2 TABLET TWICE A WEEK Patient taking differently: TAKE 1/2 TABLET TWICE A WEEK ON SAT AND SUN 07/02/17  Yes Bensimhon, Shaune Pascal, MD  fexofenadine (ALLEGRA) 180 MG tablet Take 180 mg by mouth daily as needed for allergies.    Yes [provider]  fish oil-omega-3 fatty acids 1000 MG capsule Take 2 g by  mouth daily.    Yes [provider]  fluticasone (FLONASE) 50 MCG/ACT nasal spray Place 2 sprays into both nostrils daily.   Yes [provider]  furosemide (LASIX) 80 MG tablet Take 1 tablet (80 mg total) by mouth daily. 06/09/18  Yes Kathyrn Drown D, NP  IRON PO Take 1 capsule by mouth as directed.   Yes [provider]  isosorbide mononitrate (IMDUR) 30 MG 24 hr tablet Take 30 mg by mouth daily.   Yes [provider]  metolazone (ZAROXOLYN) 2.5 MG tablet Take 1 tablet (2.5 mg total) by mouth as needed (swelling/wt gain.). Only take when instructed by HF clinic. 06/01/18  Yes Georgiana Shore, NP  nitroGLYCERIN (NITROSTAT) 0.4 MG SL tablet Place 1 tablet (0.4 mg total) under the tongue every 5 (five) minutes as needed for chest pain. 10/28/16  Yes Josue Hector, MD    omeprazole (PRILOSEC) 40 MG capsule Take 40 mg by mouth daily.    Yes [provider]  polyethylene glycol (MIRALAX / GLYCOLAX) packet Take 17 g by mouth at bedtime.   Yes [provider]  rosuvastatin (CRESTOR) 5 MG tablet Take 1 tablet by mouth on Monday, Wednesday, and Friday 04/29/18  Yes Wellington Hampshire, MD  Budesonide (PULMICORT IN) Inhale into the lungs as directed.    02/29/12  [provider]    Physical Exam: Vitals:   06/15/18 2130 06/15/18 2300 06/16/18 0130 06/16/18 0145  BP: 101/70 104/78 105/67 104/65  Pulse: 94 90 91 88  Resp: (!) 25 16 (!) 24 (!) 24  Temp:      TempSrc:      SpO2: 100% 100% 99% 98%  Weight:      Height:       General: Not in acute distress HEENT:       Eyes: PERRL, EOMI, no scleral icterus.       ENT: No discharge from the ears and nose, no pharynx injection, no tonsillar enlargement.        Neck: No JVD, no bruit, no mass felt. Heme: No neck lymph node enlargement. Cardiac: S1/S2, RRR, No murmurs, No gallops or rubs. Respiratory: No rales, wheezing, rhonchi or rubs. GI: Soft, nondistended, has tenderness in RUQ, no rebound pain, no organomegaly, BS present. GU: No hematuria Ext: 2+ pitting leg edema bilaterally. 2+DP/PT pulse bilaterally. Musculoskeletal: No joint deformities, No joint redness or warmth, no limitation of ROM in spin. Skin: No rashes.  Neuro: Alert, oriented X3, cranial nerves II-XII grossly intact, moves all extremities normally.  Psych: Patient is not psychotic, no suicidal or hemocidal ideation.  Labs on Admission: I have personally reviewed following labs and imaging studies  CBC: Recent Labs  Lab 06/15/18 1616  WBC 8.2  HGB 11.3*  HCT 35.3*  MCV 91.0  PLT 716*   Basic Metabolic Panel: Recent Labs  Lab 06/15/18 1616  NA 127*  K 3.3*  CL 90*  CO2 22  GLUCOSE 168*  BUN 39*  CREATININE 1.69*  CALCIUM 8.9   GFR: Estimated Creatinine Clearance: 26.6 mL/min (A) (by C-G formula  based on SCr of 1.69 mg/dL (H)). Liver Function Tests: Recent Labs  Lab 06/15/18 1616  AST 267*  ALT 289*  ALKPHOS 85  BILITOT 1.4*  PROT 6.1*  ALBUMIN 3.0*   Recent Labs  Lab 06/15/18 1616  LIPASE 57*   No results for input(s): AMMONIA in the last 168 hours. Coagulation Profile: No results for input(s): INR, PROTIME in the last 168  hours. Cardiac Enzymes: Recent Labs  Lab 06/15/18 1616  TROPONINI 0.04*   BNP (last 3 results) No results for input(s): PROBNP in the last 8760 hours. HbA1C: No results for input(s): HGBA1C in the last 72 hours. CBG: No results for input(s): GLUCAP in the last 168 hours. Lipid Profile: No results for input(s): CHOL, HDL, LDLCALC, TRIG, CHOLHDL, LDLDIRECT in the last 72 hours. Thyroid Function Tests: No results for input(s): TSH, T4TOTAL, FREET4, T3FREE, THYROIDAB in the last 72 hours. Anemia Panel: No results for input(s): VITAMINB12, FOLATE, FERRITIN, TIBC, IRON, RETICCTPCT in the last 72 hours. Urine analysis:    Component Value Date/Time   COLORURINE YELLOW 06/03/2018 1958   APPEARANCEUR CLEAR 06/03/2018 1958   LABSPEC 1.014 06/03/2018 1958   PHURINE 5.0 06/03/2018 1958   GLUCOSEU NEGATIVE 06/03/2018 1958   HGBUR NEGATIVE 06/03/2018 1958   BILIRUBINUR NEGATIVE 06/03/2018 1958   KETONESUR NEGATIVE 06/03/2018 1958   PROTEINUR NEGATIVE 06/03/2018 1958   NITRITE NEGATIVE 06/03/2018 1958   LEUKOCYTESUR NEGATIVE 06/03/2018 1958   Sepsis Labs: @LABRCNTIP (procalcitonin:4,lacticidven:4) )No results found for this or any previous visit (from the past 240 hour(s)).   Radiological Exams on Admission: Ct Abdomen Pelvis Wo Contrast  Result Date: 06/15/2018 CLINICAL DATA:  Blood in stool.  Abdominal pain. EXAM: CT ABDOMEN AND PELVIS WITHOUT CONTRAST TECHNIQUE: Multidetector CT imaging of the abdomen and pelvis was performed following the standard protocol without IV contrast. COMPARISON:  CT chest 04/19/2018. FINDINGS: Lower chest: Lung  bases show mild dependent atelectasis. Heart is enlarged. No pericardial or pleural effusion. Distal esophagus is grossly unremarkable. Hepatobiliary: Liver is unremarkable. Gallbladder wall thickening versus pericholecystic fluid. No biliary ductal dilatation. Pancreas: Negative. Spleen: Negative. Adrenals/Urinary Tract: Right adrenal gland is unremarkable. There may be thickening of the left adrenal gland. Punctate stone in the right kidney. Ureters are decompressed. Bladder is grossly unremarkable. Stomach/Bowel: Stomach, small bowel and colon are unremarkable. Appendix not readily visualized. Vascular/Lymphatic: Atherosclerotic calcification of the arterial vasculature without abdominal aortic aneurysm. No pathologically enlarged lymph nodes. Reproductive: Uterus is visualized.  No adnexal mass. Other: Moderate ascites. No free air. Presacral edema. Mesenteries and peritoneum are otherwise unremarkable. Musculoskeletal: Degenerative changes in the spine. IMPRESSION: 1. Gallbladder wall thickening versus pericholecystic fluid. If there are symptoms localized to the right upper quadrant, ultrasound may be helpful in further evaluation, as clinically indicated. 2. Moderate ascites. 3.  Aortic atherosclerosis (ICD10-170.0). 4. Punctate right renal stone. Electronically Signed   By: Lorin Picket M.D.   On: 06/15/2018 19:57   Dg Chest 2 View  Result Date: 06/15/2018 CLINICAL DATA:  Bright red rectal bleeding since Sunday, abdominal pain, on aspirin, history CHF, asthma, hypertension, ischemic cardiomyopathy, prior MI and coronary stenting, smoker EXAM: CHEST - 2 VIEW COMPARISON:  06/04/2018 FINDINGS: LEFT subclavian AICD lead tip projects over LEFT ventricle unchanged. Enlargement of cardiac silhouette with pulmonary vascular congestion. Atherosclerotic calcification aorta. Stable mediastinal contours. Lungs mildly hyperinflated but clear. No acute infiltrate, pleural effusion or pneumothorax. Bones  demineralized. IMPRESSION: Enlargement of cardiac silhouette with pulmonary vascular congestion. No acute abnormalities. Electronically Signed   By: Lavonia Dana M.D.   On: 06/15/2018 16:47   US Abdomen Limited Ruq  Result Date: 06/15/2018 CLINICAL DATA:  Abdominal pain for 4 days. EXAM: ULTRASOUND ABDOMEN LIMITED RIGHT UPPER QUADRANT COMPARISON:  CT abdomen and pelvis June 15, 2018. FINDINGS: Gallbladder: No echogenic gallstones. 3 mm echogenic mural base polyp present. Gallbladder wall thickened at 4 mm. No pericholecystic fluid. No sonographic Murphy sign elicited. Common bile  duct: Diameter: 3 mm. Liver: No focal lesion identified. Within normal limits in parenchymal echogenicity. Echogenic granuloma. Portal vein is patent on color Doppler imaging with bidirectional color flow, pulsatile spectral waveforms. Anechoic free fluid. IMPRESSION: 1. Ascites. 2. Gallbladder wall thickening attributable to ascites without corroborated findings of acute cholecystitis. 3. 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 Electronically Signed   By: Elon Alas M.D.   On: 06/15/2018 22:43     EKG: Independently reviewed.  Seems to be in sinus rhythm, QTC 469, widening QRS wave, poor R wave progression   Assessment/Plan Principal Problem:   Abdominal pain Active Problems:   Essential hypertension   Tobacco abuse   Implantable cardioverter-defibrillator (ICD) in situ   Hyperlipidemia   Acute renal failure superimposed on stage 3 chronic kidney disease (HCC)   Asthma   CAD (coronary artery disease)   Acute on chronic systolic CHF (congestive heart failure) (HCC)   Hyponatremia   Hypokalemia   Rectal bleeding   Elevated troponin   Iron deficiency anemia   Abnormal LFTs   Abdominal pain, nausea, vomiting: pt has  abnormal liver function. CT scan showed gallbladder wall thickening, right upper quadrant abdominal ultrasound is not  determinant.  General surgeon was consulted, Dr. Dema Severin recommended HIDA scan which is ordered.  -will admit to tele bed as inpt -NPO -HIDA -prn Zofran for nausea and morphine for pain  Abnormal liver function: -Follow-up HIDA scan as above -Hepatitis panel - Avoid Tylenol use, - Hold Crestor  Essential hypertension: -hold Lasix and metolazone due to worsening renal function and hyponatremia - IV hydralazine as needed  HLD -hold crestor due to abnormal liver function -continue zetia  Tobacco abuse: -nicotine patch  Acute renal failure superimposed on stage 3 chronic kidney disease ([place]): Baseline Cre is 1.0-1.2 (recently worsened, 1.78 on 06/08/18), pt's Cre is 1.69 and BUN 39 on admission. Likely due to dehydration 2/2 N/V/AP and continuation of diuretics.  No hydronephrosis on CT scan. - Follow up renal function by BMP - Check FeUrea - Hold Lasix and metolazone (patient received 1 dose of Lasix in ED for CHF exacerbation)  Asthma: -Dulera inhaler -albuterol nebs prn  Acute on chronic systolic CHF: Patient has mild shortness of breath, elevated BNP 2878, positive JVD, pulmonary vascular congestion on chest x-ray and 2+ leg edema, consistent with CHF exacerbation.  2D echo on 06/05/2018 showed EF 20% -Patient was given 1 dose of Lasix 40 mg by IV ED, will hold Lasix now. Need to reevaluat renal function and hyponatremia before further diuresis -Follow-up 2D echo  CAD and elevated trop: trop 0.04. No CP. Likely due to demand ischemia secondary to CHF exacerbation. - prn Nitroglycerin, Morphine, and zetia - Risk factor stratification: will check FLP and A1C  - 2d echo - hold ASA due to rectal bleeding  Hyponatremia: Na 127. Likely multifactorial etiology, including nausea vomiting and diuretic use.  Mental status normal. -Follow-up by BMP before giving further diuretics -check TSH -pt is on NPO (similar to fluid restriction)  Hypokalemia: K=3.3 on admission. 800 mg of  magnesium oxide will give me ED. - Repleted - Check Mg level  Rectal bleeding: FOBT negative. hgb stable. 11.2<--10.9 on 06/05/18 -repeat CBC in am -hold ASA  Iron supplement ron deficiency anemia: -continue iron supplement  DVT ppx: SCD Code Status: Full code Family Communication: None at bed side.   Disposition Plan:  Anticipate discharge back to previous home environment Consults called: General  surgeon, Dr. Dema Severin Admission status:  Inpatient/tele    Date of Service 06/16/2018    Ivor Costa [facility] Pager [phone]  If 7PM-7AM, please contact night-coverage [email or web address] Password [email or web address] 06/16/2018, 1:55 AM

## 2018-06-16 NOTE — Progress Notes (Signed)
NM called, patient will be going down for scan soon.

## 2018-06-16 NOTE — Progress Notes (Signed)
Dr. Darrick Meigs notified of pt having a 5 run beat of wide QRS.

## 2018-06-17 LAB — COMPREHENSIVE METABOLIC PANEL
ALK PHOS: 78 U/L (ref 38–126)
ALT: 367 U/L — AB (ref 0–44)
ANION GAP: 13 (ref 5–15)
AST: 286 U/L — ABNORMAL HIGH (ref 15–41)
Albumin: 2.9 g/dL — ABNORMAL LOW (ref 3.5–5.0)
BILIRUBIN TOTAL: 1.3 mg/dL — AB (ref 0.3–1.2)
BUN: 46 mg/dL — ABNORMAL HIGH (ref 8–23)
CALCIUM: 8.8 mg/dL — AB (ref 8.9–10.3)
CO2: 26 mmol/L (ref 22–32)
CREATININE: 2.19 mg/dL — AB (ref 0.44–1.00)
Chloride: 91 mmol/L — ABNORMAL LOW (ref 98–111)
GFR calc non Af Amer: 22 mL/min — ABNORMAL LOW (ref >60)
GFR, EST AFRICAN AMERICAN: 25 mL/min — AB (ref >60)
GLUCOSE: 120 mg/dL — AB (ref 70–99)
Potassium: 3.9 mmol/L (ref 3.5–5.1)
Sodium: 130 mmol/L — ABNORMAL LOW (ref 135–145)
TOTAL PROTEIN: 5.8 g/dL — AB (ref 6.5–8.1)

## 2018-06-17 LAB — HEPATITIS PANEL, ACUTE
HCV Ab: <0.1 s/co ratio (ref 0.0–0.9)
HEP A IGM: NEGATIVE
HEP B C IGM: NEGATIVE
Hepatitis B Surface Ag: NEGATIVE

## 2018-06-17 LAB — UREA NITROGEN, URINE: UREA NITROGEN UR: 384 mg/dL

## 2018-06-17 MED ORDER — ONDANSETRON HCL 4 MG/2ML IJ SOLN
4.0000 mg | Freq: Four times a day (QID) | INTRAMUSCULAR | Status: DC
  Administered 2018-06-17 – 2018-06-20 (×12): 4 mg via INTRAVENOUS
  Filled 2018-06-17 (×11): qty 2

## 2018-06-17 NOTE — Progress Notes (Signed)
[facility]  PROGRESS NOTE  [name] PPI:[id number] DOB: [birth year] DOA: 06/15/2018 PCP: [facility]   Brief HPI:   67 year old female with a history of hypertension, hyperlipidemia, asthma, GERD, depression, CAD, MI, CHF with EF 20%, tobacco abuse, CKD stage III, ICD placement, iron deficiency anemia came to hospital with nausea vomiting abdominal pain for the past 3 days.  Patient was seen by general surgery and also underwent HIDA scan which was negative for biliary disease.  Patient still complains of nausea.  She does have a history of GERD.  LFTs are still elevated.  Hepatitis panel has been obtained      Subjective   Patient seen and examined, still continues to have nausea.  Hepatitis panel is negative.  Also complains of epigastric pain.   Assessment/Plan:     1.  Intractable nausea/epigastric pain-likely from hepatic congestion due to CHF.  I called and discussed with Dr. Benson Norway, who agrees with diagnosis.  CT abdomen is negative, HIDA scan negative.  General surgery has signed off.  We will continue with IV diuresis.  Continue PRN Zofran for nausea and vomiting. 2. Acute systolic CHF-patient's ejection fraction is only 20% as per echo from 05/2018.  Will start Lasix 40 mg IV every 12 hours.  Echo shows right atrium dilated, right ventricle mild to moderately dilated. 3. Abnormal LFTs-likely from hepatic congestion, discussed with GI.  Hepatitis panel is negative, HIDA scan shows possible hepatocellular disease 4. Hyperlipidemia-Crestor is on hold due to abnormal LFTs, continue Zetia 5. Acute kidney injury on CKD stage III-Lasix has been restarted, will check BMP in a.m. 6. CAD-stable patient had mild elevation of troponin likely from demand ischemia due to CHF exacerbation.  Aspirin is currently on hold due to rectal bleeding 7. Rectal bleeding-likely from hemorrhoids, general surgery has signed off. 8. 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  Has been started on IV Lasix.  Will follow BMP in am.    DVT prophylaxis: SCDs  Code Status: Full code  Family Communication: No family at bedside  Disposition Plan: likely home when medically ready for discharge   Consultants:  General surgery  Procedures:  HIDA scan   Antibiotics:   Anti-infectives (From admission, onward)   None       Objective   Vitals:   06/17/18 0401 06/17/18 0723 06/17/18 0756 06/17/18 1110  BP: 104/64  109/68 104/71  Pulse: 84  86 92  Resp: 18  16 17   Temp: 97.6 F (36.4 C)  97.8 F (36.6 C) 97.6 F (36.4 C)  TempSrc: Oral  Oral Oral  SpO2: 100% 98% 98% 98%  Weight:      Height:        Intake/Output Summary (Last 24 hours) at 06/17/2018 1303 Last data filed at 06/17/2018 1000 Gross per 24 hour  Intake 1210 ml  Output 900 ml  Net 310 ml   Filed Weights   06/15/18 1604 06/16/18 0212 06/17/18 0154  Weight: 56.2 kg (124 lb) 60 kg (132 lb 4.8 oz) 59.7 kg (131 lb 11.2 oz)     Physical Examination:    General: Appears in no acute distress  Cardiovascular: S1-S2, regular  Respiratory: Clear to auscultation bilaterally  Abdomen: Soft,, no organomegaly, positive epigastric tenderness to palpation  Extremities: Trace edema of the lower extremities bilaterally  Neurologic: Alert, oriented x3     Data Reviewed: I have personally reviewed following labs and imaging studies  CBG: Recent Labs  Lab  06/16/18 1215  GLUCAP 111*    CBC: Recent Labs  Lab 06/15/18 1616 06/16/18 1019  WBC 8.2 6.7  HGB 11.3* 11.6*  HCT 35.3* 36.9  MCV 91.0 92.7  PLT 143* 136*    Basic Metabolic Panel: Recent Labs  Lab 06/15/18 1616 06/16/18 1019 06/17/18 0319  NA 127* 130* 130*  K 3.3* 4.6 3.9  CL 90* 92* 91*  CO2 22 26 26   GLUCOSE 168* 98 120*  BUN 39* 41* 46*  CREATININE 1.69* 1.88* 2.19*  CALCIUM 8.9 9.2 8.8*    No results found for this or any previous visit (from the past 240  hour(s)).   Liver Function Tests: Recent Labs  Lab 06/15/18 1616 06/16/18 1019 06/17/18 0319  AST 267* 388* 286*  ALT 289* 384* 367*  ALKPHOS 85 83 78  BILITOT 1.4* 1.4* 1.3*  PROT 6.1* 6.0* 5.8*  ALBUMIN 3.0* 3.1* 2.9*   Recent Labs  Lab 06/15/18 1616  LIPASE 57*   Recent Labs  Lab 06/16/18 0255  AMMONIA 24    Cardiac Enzymes: Recent Labs  Lab 06/15/18 1616 06/16/18 0255 06/16/18 1019 06/16/18 1437  TROPONINI 0.04* 0.04* 0.03* 0.03*   BNP (last 3 results) Recent Labs    03/21/18 1132 06/04/18 0132 06/15/18 1617  BNP 726.9* 3,135.7* 2,878.7*    ProBNP (last 3 results) No results for input(s): PROBNP in the last 8760 hours.    Studies: Ct Abdomen Pelvis Wo Contrast  Result Date: 06/15/2018 CLINICAL DATA:  Blood in stool.  Abdominal pain. EXAM: CT ABDOMEN AND PELVIS WITHOUT CONTRAST TECHNIQUE: Multidetector CT imaging of the abdomen and pelvis was performed following the standard protocol without IV contrast. COMPARISON:  CT chest 04/19/2018. FINDINGS: Lower chest: Lung bases show mild dependent atelectasis. Heart is enlarged. No pericardial or pleural effusion. Distal esophagus is grossly unremarkable. Hepatobiliary: Liver is unremarkable. Gallbladder wall thickening versus pericholecystic fluid. No biliary ductal dilatation. Pancreas: Negative. Spleen: Negative. Adrenals/Urinary Tract: Right adrenal gland is unremarkable. There may be thickening of the left adrenal gland. Punctate stone in the right kidney. Ureters are decompressed. Bladder is grossly unremarkable. Stomach/Bowel: Stomach, small bowel and colon are unremarkable. Appendix not readily visualized. Vascular/Lymphatic: Atherosclerotic calcification of the arterial vasculature without abdominal aortic aneurysm. No pathologically enlarged lymph nodes. Reproductive: Uterus is visualized.  No adnexal mass. Other: Moderate ascites. No free air. Presacral edema. Mesenteries and peritoneum are otherwise  unremarkable. Musculoskeletal: Degenerative changes in the spine. IMPRESSION: 1. Gallbladder wall thickening versus pericholecystic fluid. If there are symptoms localized to the right upper quadrant, ultrasound may be helpful in further evaluation, as clinically indicated. 2. Moderate ascites. 3.  Aortic atherosclerosis (ICD10-170.0). 4. Punctate right renal stone. Electronically Signed   By: Lorin Picket M.D.   On: 06/15/2018 19:57   Dg Chest 2 View  Result Date: 06/15/2018 CLINICAL DATA:  Bright red rectal bleeding since Sunday, abdominal pain, on aspirin, history CHF, asthma, hypertension, ischemic cardiomyopathy, prior MI and coronary stenting, smoker EXAM: CHEST - 2 VIEW COMPARISON:  06/04/2018 FINDINGS: LEFT subclavian AICD lead tip projects over LEFT ventricle unchanged. Enlargement of cardiac silhouette with pulmonary vascular congestion. Atherosclerotic calcification aorta. Stable mediastinal contours. Lungs mildly hyperinflated but clear. No acute infiltrate, pleural effusion or pneumothorax. Bones demineralized. IMPRESSION: Enlargement of cardiac silhouette with pulmonary vascular congestion. No acute abnormalities. Electronically Signed   By: Lavonia Dana M.D.   On: 06/15/2018 16:47   Nm Hepatobiliary Liver Func  Result Date: 06/16/2018 CLINICAL DATA:  Right upper quadrant pain, nausea vomiting  for 3 days EXAM: NUCLEAR MEDICINE HEPATOBILIARY IMAGING TECHNIQUE: Sequential images of the abdomen were obtained out to 60 minutes following intravenous administration of radiopharmaceutical. RADIOPHARMACEUTICALS:  4.9 mCi Tc-68m  Choletec IV COMPARISON:  None. FINDINGS: Prompt uptake of activity by the liver is seen. Delayed clearing of activity from the liver as can be seen with hepatocellular disease. Gallbladder activity is visualized, consistent with patency of cystic duct. Biliary activity passes into small bowel, consistent with patent common bile duct. IMPRESSION: No biliary obstruction. Delayed  clearing of activity from the liver as can be seen with hepatocellular disease. Electronically Signed   By: Kathreen Devoid   On: 06/16/2018 10:38   US Abdomen Limited Ruq  Result Date: 06/15/2018 CLINICAL DATA:  Abdominal pain for 4 days. EXAM: ULTRASOUND ABDOMEN LIMITED RIGHT UPPER QUADRANT COMPARISON:  CT abdomen and pelvis June 15, 2018. FINDINGS: Gallbladder: No echogenic gallstones. 3 mm echogenic mural base polyp present. Gallbladder wall thickened at 4 mm. No pericholecystic fluid. No sonographic Murphy sign elicited. Common bile duct: Diameter: 3 mm. Liver: No focal lesion identified. Within normal limits in parenchymal echogenicity. Echogenic granuloma. Portal vein is patent on color Doppler imaging with bidirectional color flow, pulsatile spectral waveforms. Anechoic free fluid. IMPRESSION: 1. Ascites. 2. Gallbladder wall thickening attributable to ascites without corroborated findings of acute cholecystitis. 3. 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 Electronically Signed   By: Elon Alas M.D.   On: 06/15/2018 22:43    Scheduled Meds: . azelastine  1 spray Each Nare Daily  . citalopram  20 mg Oral Daily  . [START ON 06/18/2018] ezetimibe  5 mg Oral Once per day on Mon Thu  . famotidine  20 mg Oral Daily  . ferrous sulfate  325 mg Oral Q breakfast  . fluticasone  2 spray Each Nare Daily  . furosemide  40 mg Intravenous Q12H  . isosorbide mononitrate  30 mg Oral Daily  . loratadine  10 mg Oral Daily  . mometasone-formoterol  2 puff Inhalation BID  . nicotine  21 mg Transdermal Daily  . omega-3 acid ethyl esters  2 g Oral Daily  . pantoprazole  40 mg Oral Daily      Time spent: 20 min  [facility] Pager [phone]. If 7PM-7AM, please contact night-coverage at [email or web address], Office  [phone]  password [email or web address]  06/17/2018, 1:03 PM  LOS: 1 day

## 2018-06-17 NOTE — Progress Notes (Signed)
Patient wondering if she can start taking her baby asa again. Also, wanting lasix administration times to be switched- will message pharmacy.

## 2018-06-17 NOTE — Plan of Care (Signed)
  Problem: Education: Goal: Ability to verbalize understanding of medication therapies will improve Outcome: Progressing   Problem: Activity: Goal: Capacity to carry out activities will improve Outcome: Progressing   

## 2018-06-17 NOTE — Progress Notes (Signed)
Patient with intermittent complaints of nausea, one episode of 5 beats of vtach- patient asymptomatic.

## 2018-06-17 NOTE — Plan of Care (Signed)
  Problem: Education: Goal: Knowledge of General Education information will improve Outcome: Progressing   Problem: Health Behavior/Discharge Planning: Goal: Ability to manage health-related needs will improve Outcome: Progressing   Problem: Nutrition: Goal: Adequate nutrition will be maintained Outcome: Progressing   Problem: Pain Managment: Goal: General experience of comfort will improve Outcome: Progressing

## 2018-06-17 NOTE — Plan of Care (Signed)
  Problem: Activity: Goal: Risk for activity intolerance will decrease Outcome: Progressing   

## 2018-06-18 ENCOUNTER — Inpatient Hospital Stay (HOSPITAL_COMMUNITY): Payer: Medicare Other

## 2018-06-18 DIAGNOSIS — I361 Nonrheumatic tricuspid (valve) insufficiency: Secondary | ICD-10-CM

## 2018-06-18 LAB — BRAIN NATRIURETIC PEPTIDE: B Natriuretic Peptide: 2926.1 pg/mL — ABNORMAL HIGH (ref 0.0–100.0)

## 2018-06-18 LAB — COMPREHENSIVE METABOLIC PANEL
ALBUMIN: 3 g/dL — AB (ref 3.5–5.0)
ALT: 365 U/L — AB (ref 0–44)
ANION GAP: 11 (ref 5–15)
AST: 246 U/L — AB (ref 15–41)
Alkaline Phosphatase: 95 U/L (ref 38–126)
BUN: 49 mg/dL — ABNORMAL HIGH (ref 8–23)
CHLORIDE: 93 mmol/L — AB (ref 98–111)
CO2: 27 mmol/L (ref 22–32)
Calcium: 9.3 mg/dL (ref 8.9–10.3)
Creatinine, Ser: 2.35 mg/dL — ABNORMAL HIGH (ref 0.44–1.00)
GFR calc non Af Amer: 20 mL/min — ABNORMAL LOW (ref >60)
GFR, EST AFRICAN AMERICAN: 23 mL/min — AB (ref >60)
GLUCOSE: 114 mg/dL — AB (ref 70–99)
Potassium: 4.2 mmol/L (ref 3.5–5.1)
Sodium: 131 mmol/L — ABNORMAL LOW (ref 135–145)
Total Bilirubin: 1.3 mg/dL — ABNORMAL HIGH (ref 0.3–1.2)
Total Protein: 6.1 g/dL — ABNORMAL LOW (ref 6.5–8.1)

## 2018-06-18 LAB — ECHOCARDIOGRAM LIMITED
HEIGHTINCHES: 63.5 in
Weight: 2113.6 oz

## 2018-06-18 NOTE — Consult Note (Signed)
Cardiology Consultation:   Patient ID: [name]; [id number]; [birth year]   Admit date: 06/15/2018 Date of Consult: 06/18/2018  Primary Care Provider: [facility] Primary Cardiologist: Jenkins Rouge, MD  Primary Electrophysiologist: Thompson Grayer, MD Heart failure cardiologist: Dr. Pierre Bali   Patient Profile:   Terri Erker Biggsis a 67 y.o.femalewith a history of ischemic cardiomyopathy, systolic heart failure ([id number]%), coronary artery disease, prior anterior MI, moderate mitral regurgitation, PVD, HLD, tobacco abuse, AICD placed, LBBB  She was admitted 05/26/2018 through 06/08/2018 for for volume overload.  She was diuresed.  She does have baseline renal insufficiency.  Over the week since discharge she is had progressive shortness of breath, abdominal pain nausea and vomiting and was admitted for further evaluation on 06/15/2018.  Her BNP is again elevated at 2900.  Her liver function tests are elevated as well suggesting passive congestion.  Follow-up 2D echo today revealed an EF in the 15% range unchanged from prior echoes.  We are asked to see her for further evaluation and treatment.    History of Present Illness:   Ms. Siebers returns after being discharged on 06/08/2018 with symptoms of volume overload and passive hepatic congestion with elevated LFTs.  She has gained several pounds since discharge.  She is had progressive dyspnea as well.  She complains of diffuse abdominal pain especially in her right upper quadrant.  Her dry weight is 124 and her weight today is 132.  Her ALT is elevated at 365 and her AST is elevated as well.  These have been progressively getting worse.  Her troponins are low.  Past Medical History:  Diagnosis Date  . Arthritis    hands  . Asthma   . CAD (coronary artery disease)    a. s/p prior Ant MI, b. s/p prior POBA to LAD, Dx, RCA,;  c.  LHC (10/12):  dLAD 40, pCFX 30, OM1 50, pRCA 30;  d.  [facility] Myoview (11/14):  High  risk study; inferior, anterior, apical defects; minimal reversibility toward the apex; consistent with prior infarct and very mild peri-infarct ischemia, EF 24%, inferior/apical/anterior akinesis  . Chronic systolic heart failure ([place])   . Depression   . H/O hiatal hernia   . HLD (hyperlipidemia)   . HTN (hypertension)   . Ischemic cardiomyopathy    MRI (12/09):  High scar burden, Inf and Apical AK, EF 24%.;  Echo (10/14): EF 15%, Gr 2 DD, mild to mod MR, mod LAE, RVSF mildly reduced, mod RAE, severe TR, PASP 49-53 (mod pulmonary HTN)  . MI (myocardial infarction) ([place])   . Tobacco abuse     Past Surgical History:  Procedure Laterality Date  . ABDOMINAL AORTOGRAM W/LOWER EXTREMITY N/A 03/02/2018   Procedure: ABDOMINAL AORTOGRAM W/LOWER EXTREMITY;  Surgeon: Wellington Hampshire, MD;  Location: [facility];  Service: Cardiovascular;  Laterality: N/A;  . CARDIAC DEFIBRILLATOR PLACEMENT  01/18/2009   MDT single chamber ICD implanted by Dr Rayann Heman   . cardiac stents    . COLONOSCOPY  03/18/2012   Procedure: COLONOSCOPY;  Surgeon: Beryle Beams, MD;  Location: [facility];  Service: Endoscopy;  Laterality: N/A;  . CORONARY ANGIOPLASTY    . ESOPHAGOGASTRODUODENOSCOPY N/A 03/10/2013   Procedure: ESOPHAGOGASTRODUODENOSCOPY (EGD);  Surgeon: Beryle Beams, MD;  Location: [facility];  Service: Endoscopy;  Laterality: N/A;  . LEFT HEART CATHETERIZATION WITH CORONARY ANGIOGRAM N/A 07/26/2014   Procedure: LEFT HEART CATHETERIZATION WITH CORONARY ANGIOGRAM;  Surgeon: Jolaine Artist, MD;  Location: [facility]  LAB;  Service: Cardiovascular;  Laterality: N/A;       Inpatient Medications: Scheduled Meds: . azelastine  1 spray Each Nare Daily  . citalopram  20 mg Oral Daily  . ezetimibe  5 mg Oral Once per day on Mon Thu  . famotidine  20 mg Oral Daily  . ferrous sulfate  325 mg Oral Q breakfast  . fluticasone  2 spray Each Nare Daily  . furosemide  40 mg Intravenous Q12H  . isosorbide  mononitrate  30 mg Oral Daily  . loratadine  10 mg Oral Daily  . mometasone-formoterol  2 puff Inhalation BID  . nicotine  21 mg Transdermal Daily  . omega-3 acid ethyl esters  2 g Oral Daily  . ondansetron (ZOFRAN) IV  4 mg Intravenous Q6H  . pantoprazole  40 mg Oral Daily   Continuous Infusions:  PRN Meds: acetaminophen, hydrALAZINE, morphine injection, nitroGLYCERIN, polyethylene glycol, zolpidem  Allergies:    Allergies  Allergen Reactions  . Prednisone Shortness Of Breath, Swelling, Rash and Other (See Comments)    Sore throat, also  . Zebeta [Bisoprolol Fumarate] Other (See Comments)    Caused confusion  . Eggs Or Egg-Derived Products Other (See Comments)    Was told she is allergic to EGG WHITES  . Statins Other (See Comments)    Gradually tired with daily Lipitor, made pt feel badly (Pt can take Crestor 5 mg 4 times per week and Zetia 5 mg two times a week)    Social History:   Social History   Socioeconomic History  . Marital status: Divorced    Spouse name: Not on file  . Number of children: 2  . Years of education: Not on file  . Highest education level: Not on file  Occupational History  . Occupation: Works for [employer]: Uncertain  . Financial resource strain: Not on file  . Food insecurity:    Worry: Not on file    Inability: Not on file  . Transportation needs:    Medical: Not on file    Non-medical: Not on file  Tobacco Use  . Smoking status: Current Every Day Smoker    Types: Cigarettes  . Smokeless tobacco: Never Used  Substance and Sexual Activity  . Alcohol use: No  . Drug use: No  . Sexual activity: Never  Lifestyle  . Physical activity:    Days per week: Not on file    Minutes per session: Not on file  . Stress: Not on file  Relationships  . Social connections:    Talks on phone: Not on file    Gets together: Not on file    Attends religious service: Not on file    Active member of club or organization: Not on  file    Attends meetings of clubs or organizations: Not on file    Relationship status: Not on file  . Intimate partner violence:    Fear of current or ex partner: Not on file    Emotionally abused: Not on file    Physically abused: Not on file    Forced sexual activity: Not on file  Other Topics Concern  . Not on file  Social History Narrative   Lives in [place]    Family History:    Family History  Problem Relation Age of Onset  . Stroke Mother   . Diabetes Mellitus II Sister      ROS:  Please see the history  of present illness.   All other ROS reviewed and negative.     Physical Exam/Data:   Vitals:   06/18/18 0300 06/18/18 0818 06/18/18 0954 06/18/18 1201  BP:  116/76  107/67  Pulse:  94  87  Resp:  20  18  Temp:  97.7 F (36.5 C)  (!) 97.5 F (36.4 C)  TempSrc:  Oral  Oral  SpO2:  98% 96% 97%  Weight: 132 lb 1.6 oz (59.9 kg)     Height:        Intake/Output Summary (Last 24 hours) at 06/18/2018 1431 Last data filed at 06/18/2018 0900 Gross per 24 hour  Intake 592 ml  Output 1150 ml  Net -558 ml   Filed Weights   06/16/18 0212 06/17/18 0154 06/18/18 0300  Weight: 132 lb 4.8 oz (60 kg) 131 lb 11.2 oz (59.7 kg) 132 lb 1.6 oz (59.9 kg)   Body mass index is 23.03 kg/m.  General:  Well nourished, well developed, in no acute distress HEENT: normal Lymph: no adenopathy Neck: no JVD Endocrine:  No thryomegaly Vascular: No carotid bruits; FA pulses 2+ bilaterally without bruits  Cardiac:  normal S1, S2; RRR; no murmur  Lungs:  clear to auscultation bilaterally, no wheezing, rhonchi or rales  Abd: Soft, diffusely tender especially right upper quadrant with active bowel sounds. Ext: 1-2+ pitting left lower extremity edema Musculoskeletal:  No deformities, BUE and BLE strength normal and equal Skin: warm and dry  Neuro:  CNs 2-12 intact, no focal abnormalities noted Psych:  Normal affect   EKG:  The EKG was personally reviewed and demonstrates: Sinus  rhythm at 89 with left bundle branch block Telemetry:  Telemetry was personally reviewed and demonstrates: Normal sinus rhythm  Relevant CV Studies: 2D echo pending  Laboratory Data:  Chemistry Recent Labs  Lab 06/16/18 1019 06/17/18 0319 06/18/18 0601  NA 130* 130* 131*  K 4.6 3.9 4.2  CL 92* 91* 93*  CO2 26 26 27   GLUCOSE 98 120* 114*  BUN 41* 46* 49*  CREATININE 1.88* 2.19* 2.35*  CALCIUM 9.2 8.8* 9.3  GFRNONAA 26* 22* 20*  GFRAA 30* 25* 23*  ANIONGAP 12 13 11     Recent Labs  Lab 06/16/18 1019 06/17/18 0319 06/18/18 0601  PROT 6.0* 5.8* 6.1*  ALBUMIN 3.1* 2.9* 3.0*  AST 388* 286* 246*  ALT 384* 367* 365*  ALKPHOS 83 78 95  BILITOT 1.4* 1.3* 1.3*   Hematology Recent Labs  Lab 06/15/18 1616 06/16/18 1019  WBC 8.2 6.7  RBC 3.88 3.98  HGB 11.3* 11.6*  HCT 35.3* 36.9  MCV 91.0 92.7  MCH 29.1 29.1  MCHC 32.0 31.4  RDW 17.2* 17.3*  PLT 143* 136*   Cardiac Enzymes Recent Labs  Lab 06/15/18 1616 06/16/18 0255 06/16/18 1019 06/16/18 1437  TROPONINI 0.04* 0.04* 0.03* 0.03*   No results for input(s): TROPIPOC in the last 168 hours.  BNP Recent Labs  Lab 06/15/18 1617 06/18/18 0601  BNP 2,878.7* 2,926.1*    DDimer No results for input(s): DDIMER in the last 168 hours.  Radiology/Studies:  Ct Abdomen Pelvis Wo Contrast  Result Date: 06/15/2018 CLINICAL DATA:  Blood in stool.  Abdominal pain. EXAM: CT ABDOMEN AND PELVIS WITHOUT CONTRAST TECHNIQUE: Multidetector CT imaging of the abdomen and pelvis was performed following the standard protocol without IV contrast. COMPARISON:  CT chest 04/19/2018. FINDINGS: Lower chest: Lung bases show mild dependent atelectasis. Heart is enlarged. No pericardial or pleural effusion. Distal esophagus is grossly unremarkable.  Hepatobiliary: Liver is unremarkable. Gallbladder wall thickening versus pericholecystic fluid. No biliary ductal dilatation. Pancreas: Negative. Spleen: Negative. Adrenals/Urinary Tract: Right adrenal  gland is unremarkable. There may be thickening of the left adrenal gland. Punctate stone in the right kidney. Ureters are decompressed. Bladder is grossly unremarkable. Stomach/Bowel: Stomach, small bowel and colon are unremarkable. Appendix not readily visualized. Vascular/Lymphatic: Atherosclerotic calcification of the arterial vasculature without abdominal aortic aneurysm. No pathologically enlarged lymph nodes. Reproductive: Uterus is visualized.  No adnexal mass. Other: Moderate ascites. No free air. Presacral edema. Mesenteries and peritoneum are otherwise unremarkable. Musculoskeletal: Degenerative changes in the spine. IMPRESSION: 1. Gallbladder wall thickening versus pericholecystic fluid. If there are symptoms localized to the right upper quadrant, ultrasound may be helpful in further evaluation, as clinically indicated. 2. Moderate ascites. 3.  Aortic atherosclerosis (ICD10-170.0). 4. Punctate right renal stone. Electronically Signed   By: Lorin Picket M.D.   On: 06/15/2018 19:57   Dg Chest 2 View  Result Date: 06/15/2018 CLINICAL DATA:  Bright red rectal bleeding since Sunday, abdominal pain, on aspirin, history CHF, asthma, hypertension, ischemic cardiomyopathy, prior MI and coronary stenting, smoker EXAM: CHEST - 2 VIEW COMPARISON:  06/04/2018 FINDINGS: LEFT subclavian AICD lead tip projects over LEFT ventricle unchanged. Enlargement of cardiac silhouette with pulmonary vascular congestion. Atherosclerotic calcification aorta. Stable mediastinal contours. Lungs mildly hyperinflated but clear. No acute infiltrate, pleural effusion or pneumothorax. Bones demineralized. IMPRESSION: Enlargement of cardiac silhouette with pulmonary vascular congestion. No acute abnormalities. Electronically Signed   By: Lavonia Dana M.D.   On: 06/15/2018 16:47   Nm Hepatobiliary Liver Func  Result Date: 06/16/2018 CLINICAL DATA:  Right upper quadrant pain, nausea vomiting for 3 days EXAM: NUCLEAR MEDICINE  HEPATOBILIARY IMAGING TECHNIQUE: Sequential images of the abdomen were obtained out to 60 minutes following intravenous administration of radiopharmaceutical. RADIOPHARMACEUTICALS:  4.9 mCi Tc-72m  Choletec IV COMPARISON:  None. FINDINGS: Prompt uptake of activity by the liver is seen. Delayed clearing of activity from the liver as can be seen with hepatocellular disease. Gallbladder activity is visualized, consistent with patency of cystic duct. Biliary activity passes into small bowel, consistent with patent common bile duct. IMPRESSION: No biliary obstruction. Delayed clearing of activity from the liver as can be seen with hepatocellular disease. Electronically Signed   By: Kathreen Devoid   On: 06/16/2018 10:38   US Abdomen Limited Ruq  Result Date: 06/15/2018 CLINICAL DATA:  Abdominal pain for 4 days. EXAM: ULTRASOUND ABDOMEN LIMITED RIGHT UPPER QUADRANT COMPARISON:  CT abdomen and pelvis June 15, 2018. FINDINGS: Gallbladder: No echogenic gallstones. 3 mm echogenic mural base polyp present. Gallbladder wall thickened at 4 mm. No pericholecystic fluid. No sonographic Murphy sign elicited. Common bile duct: Diameter: 3 mm. Liver: No focal lesion identified. Within normal limits in parenchymal echogenicity. Echogenic granuloma. Portal vein is patent on color Doppler imaging with bidirectional color flow, pulsatile spectral waveforms. Anechoic free fluid. IMPRESSION: 1. Ascites. 2. Gallbladder wall thickening attributable to ascites without corroborated findings of acute cholecystitis. 3. 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 Electronically Signed   By: Elon Alas M.D.   On: 06/15/2018 22:43    Assessment and Plan:   1. Ischemic cardiomyopathy- long history of severely depressed LV function with systolic heart failure followed by Dr. Haroldine Laws.  She is had recurrent admissions for volume overload as recently as 06/03/2018  discharged on 7/3.  She was remitted on 7/10 with volume overload, passive hepatic congestion and lower  extremity edema with elevated liver function tests and BNP.  Her serum creatinine likewise is mildly elevated over baseline.  I agree with IV diuresis.  She is mildly hyponatremic.  She may benefit from beginning IV inotropic therapy.  I have asked Dr. Loralie Champagne to evaluate her in the morning. 2. Acute on chronic renal insufficiency- baseline creatinine is 1.2-1.5.  Serum creatinine today is 2.35.  I suspect this is accommodation of diuresis and low and low output. 3. Increased liver function test- AST and ALT are progressively increased.  She had a negative HIDA scan.  I suspect this is related to passive congestion from her severe LV dysfunction. 4. Left bundle branch block-chronic   For questions or updates, please contact [place] Please consult [email or web address] for contact info under Cardiology/STEMI.   Signed, Quay Burow, MD  06/18/2018 2:31 PM

## 2018-06-18 NOTE — Progress Notes (Signed)
  Echocardiogram 2D Echocardiogram Limited has been performed.  Darlina Sicilian M 06/18/2018, 3:04 PM

## 2018-06-18 NOTE — Progress Notes (Signed)
[facility]  PROGRESS NOTE  [name] HER:[id number] DOB: 05-04-1951 DOA: 06/15/2018 PCP: [facility]   Brief HPI:   67 year old female with a history of hypertension, hyperlipidemia, asthma, GERD, depression, CAD, MI, CHF with EF 20%, tobacco abuse, CKD stage III, ICD placement, iron deficiency anemia came to hospital with nausea vomiting abdominal pain for the past 3 days.  Patient was seen by general surgery and also underwent HIDA scan which was negative for biliary disease.  Patient still complains of nausea.  She does have a history of GERD.  LFTs are still elevated.  Hepatitis panel has been obtained      Subjective   Patient seen and examined, continues to have nausea but no vomiting.  Some improvement with IV diuresis with Lasix.   Assessment/Plan:     1.  Intractable nausea/epigastric pain-likely from hepatic congestion due to CHF.  I called and discussed with Dr. Benson Norway, who agrees with diagnosis.  CT abdomen is negative, HIDA scan negative.  General surgery has signed off.  We will continue with IV diuresis with Lasix.  Continue PRN Zofran for nausea and vomiting. 2. Acute systolic CHF-patient's ejection fraction is only 20% as per echo from 05/2018.  BNP still elevated to 2,926 continue  Lasix 40 mg IV every 12 hours.  Echo shows right atrium dilated, right ventricle mild to moderately dilated.  Will consult cardiology for further recommendations. 3. Abnormal LFTs-likely from hepatic congestion, discussed with GI.  Hepatitis panel is negative, HIDA scan shows possible hepatocellular disease 4. Hyperlipidemia-Crestor is on hold due to abnormal LFTs, continue Zetia 5. Acute kidney injury on CKD stage III-Lasix has been restarted, creatinine is 2.35 6. CAD-stable patient had mild elevation of troponin likely from demand ischemia due to CHF exacerbation.  Aspirin is currently on hold due to rectal bleeding 7. Rectal bleeding-likely from  hemorrhoids, general surgery has signed off. 8. Hyponatremia-likely from underlying hypernatremic hyponatremia due to CHF.  Has been started on IV Lasix.  Today sodium is 131.    DVT prophylaxis: SCDs  Code Status: Full code  Family Communication: No family at bedside  Disposition Plan: likely home when medically ready for discharge   Consultants:  General surgery  Procedures:  HIDA scan   Antibiotics:   Anti-infectives (From admission, onward)   None       Objective   Vitals:   06/18/18 0300 06/18/18 0818 06/18/18 0954 06/18/18 1201  BP:  116/76  107/67  Pulse:  94  87  Resp:  20  18  Temp:  97.7 F (36.5 C)  (!) 97.5 F (36.4 C)  TempSrc:  Oral  Oral  SpO2:  98% 96% 97%  Weight: 59.9 kg (132 lb 1.6 oz)     Height:        Intake/Output Summary (Last 24 hours) at 06/18/2018 1242 Last data filed at 06/18/2018 0900 Gross per 24 hour  Intake 612 ml  Output 1150 ml  Net -538 ml   Filed Weights   06/16/18 0212 06/17/18 0154 06/18/18 0300  Weight: 60 kg (132 lb 4.8 oz) 59.7 kg (131 lb 11.2 oz) 59.9 kg (132 lb 1.6 oz)     Physical Examination:    General: Appears in no acute distress  Cardiovascular: S1-S2, regular  Respiratory: Clear to auscultation bilaterally   Abdomen: Soft, positive epigastric tenderness to palpation  Extremities: 1+ pitting edema of the lower extremities bilaterally  Neurologic: Alert, oriented x3     Data Reviewed: I  have personally reviewed following labs and imaging studies  CBG: Recent Labs  Lab 06/16/18 1215  GLUCAP 111*    CBC: Recent Labs  Lab 06/15/18 1616 06/16/18 1019  WBC 8.2 6.7  HGB 11.3* 11.6*  HCT 35.3* 36.9  MCV 91.0 92.7  PLT 143* 136*    Basic Metabolic Panel: Recent Labs  Lab 06/15/18 1616 06/16/18 1019 06/17/18 0319 06/18/18 0601  NA 127* 130* 130* 131*  K 3.3* 4.6 3.9 4.2  CL 90* 92* 91* 93*  CO2 22 26 26 27   GLUCOSE 168* 98 120* 114*  BUN 39* 41* 46* 49*  CREATININE  1.69* 1.88* 2.19* 2.35*  CALCIUM 8.9 9.2 8.8* 9.3    No results found for this or any previous visit (from the past 240 hour(s)).   Liver Function Tests: Recent Labs  Lab 06/15/18 1616 06/16/18 1019 06/17/18 0319 06/18/18 0601  AST 267* 388* 286* 246*  ALT 289* 384* 367* 365*  ALKPHOS 85 83 78 95  BILITOT 1.4* 1.4* 1.3* 1.3*  PROT 6.1* 6.0* 5.8* 6.1*  ALBUMIN 3.0* 3.1* 2.9* 3.0*   Recent Labs  Lab 06/15/18 1616  LIPASE 57*   Recent Labs  Lab 06/16/18 0255  AMMONIA 24    Cardiac Enzymes: Recent Labs  Lab 06/15/18 1616 06/16/18 0255 06/16/18 1019 06/16/18 1437  TROPONINI 0.04* 0.04* 0.03* 0.03*   BNP (last 3 results) Recent Labs    06/04/18 0132 06/15/18 1617 06/18/18 0601  BNP 3,135.7* 1,601.0* 2,926.1*    ProBNP (last 3 results) No results for input(s): PROBNP in the last 8760 hours.    Studies: No results found.  Scheduled Meds: . azelastine  1 spray Each Nare Daily  . citalopram  20 mg Oral Daily  . ezetimibe  5 mg Oral Once per day on Mon Thu  . famotidine  20 mg Oral Daily  . ferrous sulfate  325 mg Oral Q breakfast  . fluticasone  2 spray Each Nare Daily  . furosemide  40 mg Intravenous Q12H  . isosorbide mononitrate  30 mg Oral Daily  . loratadine  10 mg Oral Daily  . mometasone-formoterol  2 puff Inhalation BID  . nicotine  21 mg Transdermal Daily  . omega-3 acid ethyl esters  2 g Oral Daily  . ondansetron (ZOFRAN) IV  4 mg Intravenous Q6H  . pantoprazole  40 mg Oral Daily      Time spent: 20 min  Oswald Hillock   [facility] Pager [phone]. If 7PM-7AM, please contact night-coverage at [email or web address], Office  [phone]  password [email or web address]  06/18/2018, 12:42 PM  LOS: 2 days

## 2018-06-18 NOTE — Plan of Care (Signed)
  Problem: Education: Goal: Knowledge of General Education information will improve Outcome: Progressing   Problem: Health Behavior/Discharge Planning: Goal: Ability to manage health-related needs will improve Outcome: Progressing   Problem: Pain Managment: Goal: General experience of comfort will improve Outcome: Progressing   Problem: Safety: Goal: Ability to remain free from injury will improve Outcome: Progressing

## 2018-06-19 ENCOUNTER — Inpatient Hospital Stay: Payer: Self-pay

## 2018-06-19 DIAGNOSIS — I5043 Acute on chronic combined systolic (congestive) and diastolic (congestive) heart failure: Secondary | ICD-10-CM

## 2018-06-19 DIAGNOSIS — N179 Acute kidney failure, unspecified: Secondary | ICD-10-CM

## 2018-06-19 DIAGNOSIS — I5023 Acute on chronic systolic (congestive) heart failure: Secondary | ICD-10-CM

## 2018-06-19 LAB — COOXEMETRY PANEL
CARBOXYHEMOGLOBIN: 1.5 % (ref 0.5–1.5)
Methemoglobin: 1.5 % (ref 0.0–1.5)
O2 Saturation: 70.3 %
TOTAL HEMOGLOBIN: 10.8 g/dL — AB (ref 12.0–16.0)

## 2018-06-19 LAB — HEPATIC FUNCTION PANEL
ALBUMIN: 3.1 g/dL — AB (ref 3.5–5.0)
ALT: 462 U/L — ABNORMAL HIGH (ref 0–44)
AST: 310 U/L — AB (ref 15–41)
Alkaline Phosphatase: 95 U/L (ref 38–126)
BILIRUBIN DIRECT: 0.6 mg/dL — AB (ref 0.0–0.2)
Indirect Bilirubin: 1.1 mg/dL — ABNORMAL HIGH (ref 0.3–0.9)
TOTAL PROTEIN: 6.3 g/dL — AB (ref 6.5–8.1)
Total Bilirubin: 1.7 mg/dL — ABNORMAL HIGH (ref 0.3–1.2)

## 2018-06-19 LAB — BASIC METABOLIC PANEL
ANION GAP: 10 (ref 5–15)
BUN: 56 mg/dL — ABNORMAL HIGH (ref 8–23)
CALCIUM: 9.1 mg/dL (ref 8.9–10.3)
CO2: 26 mmol/L (ref 22–32)
Chloride: 95 mmol/L — ABNORMAL LOW (ref 98–111)
Creatinine, Ser: 2.64 mg/dL — ABNORMAL HIGH (ref 0.44–1.00)
GFR, EST AFRICAN AMERICAN: 20 mL/min — AB (ref >60)
GFR, EST NON AFRICAN AMERICAN: 17 mL/min — AB (ref >60)
Glucose, Bld: 99 mg/dL (ref 70–99)
POTASSIUM: 4.6 mmol/L (ref 3.5–5.1)
Sodium: 131 mmol/L — ABNORMAL LOW (ref 135–145)

## 2018-06-19 MED ORDER — FUROSEMIDE 10 MG/ML IJ SOLN
INTRAMUSCULAR | Status: AC
  Administered 2018-06-19: 40 mg via INTRAVENOUS
  Filled 2018-06-19: qty 4

## 2018-06-19 MED ORDER — ROSUVASTATIN CALCIUM 10 MG PO TABS
5.0000 mg | ORAL_TABLET | ORAL | Status: DC
  Administered 2018-06-21 – 2018-06-27 (×4): 5 mg via ORAL
  Filled 2018-06-19 (×4): qty 1

## 2018-06-19 MED ORDER — MILRINONE LACTATE IN DEXTROSE 20-5 MG/100ML-% IV SOLN
0.2500 ug/kg/min | INTRAVENOUS | Status: DC
  Administered 2018-06-19 – 2018-06-24 (×7): 0.25 ug/kg/min via INTRAVENOUS
  Filled 2018-06-19 (×7): qty 100

## 2018-06-19 MED ORDER — FUROSEMIDE 10 MG/ML IJ SOLN
15.0000 mg/h | INTRAVENOUS | Status: DC
  Administered 2018-06-19 – 2018-06-20 (×2): 12 mg/h via INTRAVENOUS
  Administered 2018-06-21 – 2018-06-24 (×4): 15 mg/h via INTRAVENOUS
  Filled 2018-06-19 (×5): qty 25
  Filled 2018-06-19: qty 21
  Filled 2018-06-19: qty 20
  Filled 2018-06-19 (×6): qty 25

## 2018-06-19 MED ORDER — ASPIRIN 81 MG PO CHEW
81.0000 mg | CHEWABLE_TABLET | Freq: Every day | ORAL | Status: DC
Start: 2018-06-19 — End: 2018-06-28
  Administered 2018-06-19 – 2018-06-28 (×9): 81 mg via ORAL
  Filled 2018-06-19 (×9): qty 1

## 2018-06-19 MED ORDER — SODIUM CHLORIDE 0.9% FLUSH: 10.0000 mL | INTRAVENOUS | Status: DC | PRN

## 2018-06-19 MED ORDER — SODIUM CHLORIDE 0.9% FLUSH
10.0000 mL | Freq: Two times a day (BID) | INTRAVENOUS | Status: DC
  Administered 2018-06-20 – 2018-06-28 (×9): 10 mL

## 2018-06-19 NOTE — Progress Notes (Signed)
OK to place PICC line in this patient's arm, either one.     Kelly Splinter MD [employer] pgr [phone]   06/19/2018, 1:03 PM

## 2018-06-19 NOTE — Consult Note (Signed)
Advanced Heart Failure Team Consult Note   Primary Physician: [facility] PCP-Cardiologist:  Jenkins Rouge, MD  Reason for Consultation: CHF  HPI:    [name] is seen today for evaluation of CHF at the request of Dr. Gwenlyn Found.   Patient has long history of CAD with prior anterior MI. Last cath in 8/15 with nonobstructive disease. She has ischemic cardiomyopathy with extensive scarring by 12/09 cardiac MRI.  Most recent echo was done this admission, showing EF 15% with moderate LV dilation and mildly decreased RV systolic function.  She additionally is an active smoker with COPD.  Baseline creatinine around 1.8, CKD stage 3.   She had been relatively stable up until around 5/19. Since that time, she has become much more fatigued and short of breath.  No chest pain.  She was admitted for a couple of days about 2 weeks ago with some diuresis.  She says that she felt better at discharge but knew that all the fluid was not off her.  Since then, she has become more fatigued, weight has gone up, and she has been short of breath with minimal exertion.  She began to develop abdominal fullness and discomfort so came to the ER.  LFTs were elevated, so she ended up having abdominal US and HIDA scan which do not suggest cholecystitis.  She was volume overloaded and Lasix IV was started.  Poor diuresis with rising creatinine, now up to 2.64.  She is still short of breath with minimal exertion.  CXR with vascular congestion.   Review of Systems:  All systems reviewed and negative except as per HPI.   Home Medications Prior to Admission medications   Medication Sig Start Date End Date Taking? Authorizing Provider  acetaminophen (TYLENOL ARTHRITIS PAIN) 650 MG CR tablet Take 650 mg by mouth every 8 (eight) hours as needed for pain.    Yes [provider]  albuterol (PROAIR HFA) 108 (90 Base) MCG/ACT inhaler Inhale 2 puffs into the lungs every 6 (six) hours as  needed for wheezing. 01/12/18  Yes [provider]  aspirin 81 MG tablet Take 81 mg by mouth daily.     Yes [provider]  azelastine (ASTELIN) 0.1 % nasal spray Place 1 spray into both nostrils daily.    Yes [provider]  budesonide-formoterol (SYMBICORT) 160-4.5 MCG/ACT inhaler Inhale 2 puffs into the lungs daily.  10/14/17  Yes [provider]  citalopram (CELEXA) 20 MG tablet Take 20 mg by mouth daily. 12/16/17  Yes [provider]  Cyanocobalamin ([employer] B12 PO) Take 1 capsule by mouth as directed.   Yes [provider]  ezetimibe (ZETIA) 10 MG tablet TAKE 1/2 TABLET TWICE A WEEK Patient taking differently: TAKE 1/2 TABLET TWICE A WEEK ON SAT AND SUN 07/02/17  Yes Bensimhon, Shaune Pascal, MD  fexofenadine (ALLEGRA) 180 MG tablet Take 180 mg by mouth daily as needed for allergies.    Yes [provider]  fish oil-omega-3 fatty acids 1000 MG capsule Take 2 g by mouth daily.    Yes [provider]  fluticasone (FLONASE) 50 MCG/ACT nasal spray Place 2 sprays into both nostrils daily.   Yes [provider]  furosemide (LASIX) 80 MG tablet Take 1 tablet (80 mg total) by mouth daily. 06/09/18  Yes Kathyrn Drown D, NP  IRON PO Take 1 capsule by mouth as directed.   Yes [provider]  isosorbide mononitrate (IMDUR) 30 MG 24 hr  tablet Take 30 mg by mouth daily.   Yes [provider]  metolazone (ZAROXOLYN) 2.5 MG tablet Take 1 tablet (2.5 mg total) by mouth as needed (swelling/wt gain.). Only take when instructed by HF clinic. 06/01/18  Yes Georgiana Shore, NP  nitroGLYCERIN (NITROSTAT) 0.4 MG SL tablet Place 1 tablet (0.4 mg total) under the tongue every 5 (five) minutes as needed for chest pain. 10/28/16  Yes Josue Hector, MD  omeprazole (PRILOSEC) 40 MG capsule Take 40 mg by mouth daily.    Yes [provider]  polyethylene glycol (MIRALAX / GLYCOLAX) packet Take 17 g by mouth at bedtime.   Yes  [provider]  rosuvastatin (CRESTOR) 5 MG tablet Take 1 tablet by mouth on Monday, Wednesday, and Friday 04/29/18  Yes Wellington Hampshire, MD  Budesonide (PULMICORT IN) Inhale into the lungs as directed.    02/29/12  [provider]    Past Medical History: 1. COPD: Emphysema on 5/19 chest CT.  Active smoker.  2. HTN 3. Hyperlipidemia.  4. CAD: s/p Anterior MI.  Per notes, patient has history of POBA to LAD, diagonal, RCA.  - Last cath in 8/15 with moderate nonobstructive disease.  5. Carotid stenosis: dopplers (9/18) with 93-81% RICA, 82-99% LICA.   6. PAD: 3/19 peripheral angiography with occluded left SFA, no intervention.  7. Chronic systolic CHF: Ischemic cardiomyopathy.  Cardiac MRI in 12/09 with "high scar burden," E 24%.  - MDT ICD - Echo (7/19) with EF 15%, moderate LV dilation, moderate MR, mildly decreased RV systolic function.  8. CKD: Stage 3.  Baseline creatinine around 1.8.   Past Surgical History: Past Surgical History:  Procedure Laterality Date  . ABDOMINAL AORTOGRAM W/LOWER EXTREMITY N/A 03/02/2018   Procedure: ABDOMINAL AORTOGRAM W/LOWER EXTREMITY;  Surgeon: Wellington Hampshire, MD;  Location: [facility];  Service: Cardiovascular;  Laterality: N/A;  . CARDIAC DEFIBRILLATOR PLACEMENT  01/18/2009   MDT single chamber ICD implanted by Dr Rayann Heman   . cardiac stents    . COLONOSCOPY  03/18/2012   Procedure: COLONOSCOPY;  Surgeon: Beryle Beams, MD;  Location: [facility];  Service: Endoscopy;  Laterality: N/A;  . CORONARY ANGIOPLASTY    . ESOPHAGOGASTRODUODENOSCOPY N/A 03/10/2013   Procedure: ESOPHAGOGASTRODUODENOSCOPY (EGD);  Surgeon: Beryle Beams, MD;  Location: [facility];  Service: Endoscopy;  Laterality: N/A;  . LEFT HEART CATHETERIZATION WITH CORONARY ANGIOGRAM N/A 07/26/2014   Procedure: LEFT HEART CATHETERIZATION WITH CORONARY ANGIOGRAM;  Surgeon: Jolaine Artist, MD;  Location: [facility];  Service: Cardiovascular;  Laterality: N/A;      Family History: Family History  Problem Relation Age of Onset  . Stroke Mother   . Diabetes Mellitus II Sister     Social History: Social History   Socioeconomic History  . Marital status: Divorced    Spouse name: Not on file  . Number of children: 2  . Years of education: Not on file  . Highest education level: Not on file  Occupational History  . Occupation: Works for [employer]: [place]  . Financial resource strain: Not on file  . Food insecurity:    Worry: Not on file    Inability: Not on file  . Transportation needs:    Medical: Not on file    Non-medical: Not on file  Tobacco Use  . Smoking status: Current Every Day Smoker    Types: Cigarettes  . Smokeless tobacco: Never Used  Substance and  Sexual Activity  . Alcohol use: No  . Drug use: No  . Sexual activity: Never  Lifestyle  . Physical activity:    Days per week: Not on file    Minutes per session: Not on file  . Stress: Not on file  Relationships  . Social connections:    Talks on phone: Not on file    Gets together: Not on file    Attends religious service: Not on file    Active member of club or organization: Not on file    Attends meetings of clubs or organizations: Not on file    Relationship status: Not on file  Other Topics Concern  . Not on file  Social History Narrative   Lives in [place]    Allergies:  Allergies  Allergen Reactions  . Prednisone Shortness Of Breath, Swelling, Rash and Other (See Comments)    Sore throat, also  . Zebeta [Bisoprolol Fumarate] Other (See Comments)    Caused confusion  . Eggs Or Egg-Derived Products Other (See Comments)    Was told she is allergic to EGG WHITES  . Statins Other (See Comments)    Gradually tired with daily Lipitor, made pt feel badly (Pt can take Crestor 5 mg 4 times per week and Zetia 5 mg two times a week)    Objective:    Vital Signs:   Temp:  [97.5 F (36.4 C)-98.1 F (36.7 C)] 98.1 F (36.7  C) (07/14 0808) Pulse Rate:  [84-95] 95 (07/14 1008) Resp:  [18-20] 18 (07/14 0808) BP: (98-110)/(67-76) 98/76 (07/14 1008) SpO2:  [97 %-100 %] 100 % (07/14 1008) Weight:  [131 lb 14.4 oz (59.8 kg)] 131 lb 14.4 oz (59.8 kg) (07/14 0526) Last BM Date: 06/18/18  Weight change: Filed Weights   06/17/18 0154 06/18/18 0300 06/19/18 0526  Weight: 131 lb 11.2 oz (59.7 kg) 132 lb 1.6 oz (59.9 kg) 131 lb 14.4 oz (59.8 kg)    Intake/Output:   Intake/Output Summary (Last 24 hours) at 06/19/2018 1057 Last data filed at 06/19/2018 1025 Gross per 24 hour  Intake 840 ml  Output 1000 ml  Net -160 ml      Physical Exam    General:  Well appearing. No resp difficulty HEENT: normal Neck: supple. JVP 14-16 cm. Carotids 2+ bilat; no bruits. No lymphadenopathy or thyromegaly appreciated. Cor: Lateral PMI. Regular rate & rhythm. No rubs, gallops. 1/6 HSM apex.  Lungs: clear Abdomen: soft, nontender, nondistended. No hepatosplenomegaly. No bruits or masses. Good bowel sounds. Extremities: no cyanosis, clubbing, rash, edema Neuro: alert & orientedx3, cranial nerves grossly intact. moves all 4 extremities w/o difficulty. Anxious.    Telemetry   NSR, personally reviewed   EKG    NSR with IVCD (QRS 124 msec), personally reviewed.   Labs   Basic Metabolic Panel: Recent Labs  Lab 06/15/18 1616 06/16/18 1019 06/17/18 0319 06/18/18 0601 06/19/18 0616  NA 127* 130* 130* 131* 131*  K 3.3* 4.6 3.9 4.2 4.6  CL 90* 92* 91* 93* 95*  CO2 22 26 26 27 26   GLUCOSE 168* 98 120* 114* 99  BUN 39* 41* 46* 49* 56*  CREATININE 1.69* 1.88* 2.19* 2.35* 2.64*  CALCIUM 8.9 9.2 8.8* 9.3 9.1    Liver Function Tests: Recent Labs  Lab 06/15/18 1616 06/16/18 1019 06/17/18 0319 06/18/18 0601 06/19/18 0923  AST 267* 388* 286* 246* PENDING  ALT 289* 384* 367* 365* 462*  ALKPHOS 85 83 78 95 95  BILITOT 1.4* 1.4* 1.3*  1.3* 1.7*  PROT 6.1* 6.0* 5.8* 6.1* 6.3*  ALBUMIN 3.0* 3.1* 2.9* 3.0* 3.1*    Recent Labs  Lab 06/15/18 1616  LIPASE 57*   Recent Labs  Lab 06/16/18 0255  AMMONIA 24    CBC: Recent Labs  Lab 06/15/18 1616 06/16/18 1019  WBC 8.2 6.7  HGB 11.3* 11.6*  HCT 35.3* 36.9  MCV 91.0 92.7  PLT 143* 136*    Cardiac Enzymes: Recent Labs  Lab 06/15/18 1616 06/16/18 0255 06/16/18 1019 06/16/18 1437  TROPONINI 0.04* 0.04* 0.03* 0.03*    BNP: BNP (last 3 results) Recent Labs    06/04/18 0132 06/15/18 1617 06/18/18 0601  BNP 3,135.7* 8,527.7* 2,926.1*    ProBNP (last 3 results) No results for input(s): PROBNP in the last 8760 hours.   CBG: Recent Labs  Lab 06/16/18 1215  GLUCAP 111*    Coagulation Studies: No results for input(s): LABPROT, INR in the last 72 hours.   Imaging   [place] Ekg Site Rite  Result Date: 06/19/2018 If Site Rite image not attached, placement could not be confirmed due to current cardiac rhythm.     Medications:     Current Medications: . aspirin  81 mg Oral Daily  . azelastine  1 spray Each Nare Daily  . citalopram  20 mg Oral Daily  . ezetimibe  5 mg Oral Once per day on Mon Thu  . famotidine  20 mg Oral Daily  . ferrous sulfate  325 mg Oral Q breakfast  . fluticasone  2 spray Each Nare Daily  . furosemide  40 mg Intravenous Q12H  . isosorbide mononitrate  30 mg Oral Daily  . loratadine  10 mg Oral Daily  . mometasone-formoterol  2 puff Inhalation BID  . nicotine  21 mg Transdermal Daily  . omega-3 acid ethyl esters  2 g Oral Daily  . ondansetron (ZOFRAN) IV  4 mg Intravenous Q6H  . pantoprazole  40 mg Oral Daily  . rosuvastatin  5 mg Oral QODAY     Infusions: . furosemide (LASIX) infusion    . milrinone       Assessment/Plan   1. Acute on chronic systolic CHF: Ischemic cardiomyopathy, echo in 7/19 with EF 15%, mildly decreased RV systolic function.  Symptoms have been worsening over a number of weeks, had recent admission at the end of June but probably was not fully diuresed prior to  discharge.  Today, she is markedly volume overloaded on exam.  I suspect that her GI symptoms are due to RV failure with congestive hepatopathy/elevated LFTs.  I am concerned for low output HF with progressive creatinine rise and cool extremities.  - BP is stable.  I am going to start her on milrinone 0.25 mcg/kg/min.  - I will place PICC line and follow CVP/co-ox (send co-ox after PICC placed).  I will transfer her to step-down for the monitoring.   - She has had Lasix 40 mg IV already today.  Start Lasix 12 mg/hr after milrinone is begun.   - No beta blocker with concern for low output.   - No ACEI/ARB/ARNI/spironolactone/digoxin with AKI.  - Overall picture, as above, is concerning for low output HF with RV failure and cardiorenal syndrome.  If we can stabilize her, LVAD may be needed at this point.  She would not be a transplant candidate with active smoking and age.  2. AKI on CKD stage 3: She is clearly volume overloaded.  As above, concerned for cardiorenal syndrome with low  output HF.  Hopefully milrinone + diuresis with lowering renal venous pressure will help.  3. 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  Workup for gallbladder disease unremarkable.  Follow closely, hopefully will improve with diuresis.  4. COPD: Emphysema on CT chest.  Needs to quit smoking (discussed).  5. CAD: As above, nonobstructive disease on last cath in 8/15.  No chest pain.   - Restart ASA 81 daily.  - Restart Crestor 5 mg qod (cannot tolerate higher dose).  6. PAD: Chronic claudication with left SFA occluded.  7. Carotid stenosis: Moderate RICA stenosis on 9/18 dopplers.   Length of Stay: 3  Loralie Champagne, MD  06/19/2018, 10:57 AM  Advanced Heart Failure Team Pager [phone] (M-F; 7a - 4p)  Please contact [facility] for night-coverage after hours (4p -7a ) and weekends on [email or web address]

## 2018-06-19 NOTE — Progress Notes (Signed)
[facility]  PROGRESS NOTE  [name]:[id number] DOB: Aug 16, 1951 DOA: 06/15/2018 PCP: [facility]   Brief HPI:   67 year old female with a history of hypertension, hyperlipidemia, asthma, GERD, depression, CAD, MI, CHF with EF 20%, tobacco abuse, CKD stage III, ICD placement, iron deficiency anemia came to hospital with nausea vomiting abdominal pain for the past 3 days.  Patient was seen by general surgery and also underwent HIDA scan which was negative for biliary disease.  Patient still complains of nausea.  She does have a history of GERD.  LFTs are still elevated.  Hepatitis panel has been obtained      Subjective   Patient seen and examined, still has nausea.  Denies chest pain or shortness of breath.   Assessment/Plan:     1.  Intractable nausea/epigastric pain-likely from hepatic congestion due to CHF.  I called and discussed with Dr. Benson Norway, who agrees with diagnosis.  CT abdomen is negative, HIDA scan negative.  General surgery has signed off.  We will continue with IV diuresis with Lasix.  Continue PRN Zofran for nausea and vomiting. 2. Acute systolic CHF-patient's ejection fraction is only 20% as per echo from 05/2018.  BNP still elevated to 2,926 continue  Lasix 40 mg IV every 12 hours.  Echo shows right atrium dilated, right ventricle mild to moderately dilated.  Patient started on low-dose milrinone as her renal function is now worsening. 3. Abnormal LFTs-likely from hepatic congestion, discussed with GI.  Hepatitis panel is negative, HIDA scan shows possible hepatocellular disease 4. Hyperlipidemia-Crestor is on hold due to abnormal LFTs, continue Zetia 5. Acute kidney injury on CKD stage III-Lasix has been restarted, creatinine is 2.35.  Renal function is worsening, patient started on milrinone.  Along with Lasix infusion at 12 mg/h 6. CAD-stable patient had mild elevation of troponin likely from demand ischemia due to CHF  exacerbation.  Aspirin is currently on hold due to rectal bleeding 7. Rectal bleeding-likely from hemorrhoids, general surgery has signed off. 8. Hyponatremia-likely from underlying hypernatremic hyponatremia due to CHF.  Has been started on IV Lasix.  Today sodium is 131.    DVT prophylaxis: SCDs  Code Status: Full code  Family Communication: No family at bedside  Disposition Plan: likely home when medically ready for discharge   Consultants:  General surgery  Procedures:  HIDA scan   Antibiotics:   Anti-infectives (From admission, onward)   None       Objective   Vitals:   06/19/18 0808 06/19/18 0846 06/19/18 1008 06/19/18 1211  BP: 104/71  98/76 110/67  Pulse: 85  95 92  Resp: 18   (!) 2  Temp: 98.1 F (36.7 C)   (!) 97.5 F (36.4 C)  TempSrc: Oral   Oral  SpO2: 97% 98% 100% 99%  Weight:      Height:        Intake/Output Summary (Last 24 hours) at 06/19/2018 1226 Last data filed at 06/19/2018 1025 Gross per 24 hour  Intake 840 ml  Output 700 ml  Net 140 ml   Filed Weights   06/17/18 0154 06/18/18 0300 06/19/18 0526  Weight: 59.7 kg (131 lb 11.2 oz) 59.9 kg (132 lb 1.6 oz) 59.8 kg (131 lb 14.4 oz)     Physical Examination:   Lungs: Normal respiratory effort, bilateral clear to auscultation, no crackles or wheezes.  Heart: Regular rate and rhythm, S1 and S2 normal, no murmurs, rubs auscultated Abdomen: BS normoactive,soft,nondistended,non-tender to palpation,no organomegaly Extremities:  Bilateral 1+ pitting edema of the lower extremities Neuro : Alert and oriented to time, place and person, No focal deficits      Data Reviewed: I have personally reviewed following labs and imaging studies  CBG: Recent Labs  Lab 06/16/18 1215  GLUCAP 111*    CBC: Recent Labs  Lab 06/15/18 1616 06/16/18 1019  WBC 8.2 6.7  HGB 11.3* 11.6*  HCT 35.3* 36.9  MCV 91.0 92.7  PLT 143* 136*    Basic Metabolic Panel: Recent Labs  Lab 06/15/18 1616  06/16/18 1019 06/17/18 0319 06/18/18 0601 06/19/18 0616  NA 127* 130* 130* 131* 131*  K 3.3* 4.6 3.9 4.2 4.6  CL 90* 92* 91* 93* 95*  CO2 22 26 26 27 26   GLUCOSE 168* 98 120* 114* 99  BUN 39* 41* 46* 49* 56*  CREATININE 1.69* 1.88* 2.19* 2.35* 2.64*  CALCIUM 8.9 9.2 8.8* 9.3 9.1    No results found for this or any previous visit (from the past 240 hour(s)).   Liver Function Tests: Recent Labs  Lab 06/15/18 1616 06/16/18 1019 06/17/18 0319 06/18/18 0601 06/19/18 0923  AST 267* 388* 286* 246* 310*  ALT 289* 384* 367* 365* 462*  ALKPHOS 85 83 78 95 95  BILITOT 1.4* 1.4* 1.3* 1.3* 1.7*  PROT 6.1* 6.0* 5.8* 6.1* 6.3*  ALBUMIN 3.0* 3.1* 2.9* 3.0* 3.1*   Recent Labs  Lab 06/15/18 1616  LIPASE 57*   Recent Labs  Lab 06/16/18 0255  AMMONIA 24    Cardiac Enzymes: Recent Labs  Lab 06/15/18 1616 06/16/18 0255 06/16/18 1019 06/16/18 1437  TROPONINI 0.04* 0.04* 0.03* 0.03*   BNP (last 3 results) Recent Labs    06/04/18 0132 06/15/18 1617 06/18/18 0601  BNP 3,135.7* 7,416.3* 2,926.1*    ProBNP (last 3 results) No results for input(s): PROBNP in the last 8760 hours.    Studies: [place] Ekg Site Rite  Result Date: 06/19/2018 If Site Rite image not attached, placement could not be confirmed due to current cardiac rhythm.  [place] Ekg Site Rite  Result Date: 06/19/2018 If Site Rite image not attached, placement could not be confirmed due to current cardiac rhythm.   Scheduled Meds: . aspirin  81 mg Oral Daily  . azelastine  1 spray Each Nare Daily  . citalopram  20 mg Oral Daily  . ezetimibe  5 mg Oral Once per day on Mon Thu  . famotidine  20 mg Oral Daily  . ferrous sulfate  325 mg Oral Q breakfast  . fluticasone  2 spray Each Nare Daily  . isosorbide mononitrate  30 mg Oral Daily  . loratadine  10 mg Oral Daily  . mometasone-formoterol  2 puff Inhalation BID  . nicotine  21 mg Transdermal Daily  . omega-3 acid ethyl esters  2 g Oral Daily  . ondansetron  (ZOFRAN) IV  4 mg Intravenous Q6H  . pantoprazole  40 mg Oral Daily  . rosuvastatin  5 mg Oral QODAY      Time spent: 20 min  [facility] Pager [phone]. If 7PM-7AM, please contact night-coverage at [email or web address], Office  [phone]  password [email or web address]  06/19/2018, 12:26 PM  LOS: 3 days

## 2018-06-19 NOTE — Progress Notes (Signed)
Peripherally Inserted Central Catheter/Midline Placement  The IV Nurse has discussed with the patient and/or persons authorized to consent for the patient, the purpose of this procedure and the potential benefits and risks involved with this procedure.  The benefits include less needle sticks, lab draws from the catheter, and the patient may be discharged home with the catheter. Risks include, but not limited to, infection, bleeding, blood clot (thrombus formation), and puncture of an artery; nerve damage and irregular heartbeat and possibility to perform a PICC exchange if needed/ordered by physician.  Alternatives to this procedure were also discussed.  Bard Power PICC patient education guide, fact sheet on infection prevention and patient information card has been provided to patient /or left at bedside.  Sister at bedside also agreeable to procedure.  PICC/Midline Placement Documentation  PICC Double Lumen 06/19/18 PICC Right Brachial 40 cm 1 cm (Active)  Indication for Insertion or Continuance of Line Vasoactive infusions 06/19/2018  3:34 PM  Exposed Catheter (cm) 1 cm 06/19/2018  3:34 PM  Site Assessment Clean;Dry;Intact 06/19/2018  3:34 PM  Lumen #1 Status Flushed;Saline locked;Blood return noted 06/19/2018  3:34 PM  Lumen #2 Status Flushed;Saline locked;Blood return noted 06/19/2018  3:34 PM  Dressing Type Transparent 06/19/2018  3:34 PM  Dressing Status Clean;Dry;Intact;Antimicrobial disc in place 06/19/2018  3:34 PM  Line Care Connections checked and tightened 06/19/2018  3:34 PM  Line Adjustment (NICU/IV Team Only) No 06/19/2018  3:34 PM  Dressing Intervention New dressing 06/19/2018  3:34 PM  Dressing Change Due 06/26/18 06/19/2018  3:34 PM       Rolena Infante 06/19/2018, 3:35 PM

## 2018-06-19 NOTE — Progress Notes (Signed)
Called to give report to nurse on 2C-10.  Placed IV team consult now that PICC has been approved by Nephrologist.

## 2018-06-19 NOTE — Progress Notes (Signed)
Progress Note  Patient Name: ARMANDA FORAND Date of Encounter: 06/19/2018  Primary Cardiologist: Jenkins Rouge, MD   Subjective   Remains sob. No chest pain.   Inpatient Medications    Scheduled Meds: . aspirin  81 mg Oral Daily  . azelastine  1 spray Each Nare Daily  . citalopram  20 mg Oral Daily  . ezetimibe  5 mg Oral Once per day on Mon Thu  . famotidine  20 mg Oral Daily  . ferrous sulfate  325 mg Oral Q breakfast  . fluticasone  2 spray Each Nare Daily  . isosorbide mononitrate  30 mg Oral Daily  . loratadine  10 mg Oral Daily  . mometasone-formoterol  2 puff Inhalation BID  . nicotine  21 mg Transdermal Daily  . omega-3 acid ethyl esters  2 g Oral Daily  . ondansetron (ZOFRAN) IV  4 mg Intravenous Q6H  . pantoprazole  40 mg Oral Daily  . rosuvastatin  5 mg Oral QODAY   Continuous Infusions: . furosemide (LASIX) infusion    . milrinone     PRN Meds: acetaminophen, hydrALAZINE, morphine injection, nitroGLYCERIN, polyethylene glycol, zolpidem   Vital Signs    Vitals:   06/19/18 0526 06/19/18 0808 06/19/18 0846 06/19/18 1008  BP: 107/68 104/71  98/76  Pulse: 84 85  95  Resp: 20 18    Temp: 97.8 F (36.6 C) 98.1 F (36.7 C)    TempSrc: Oral Oral    SpO2: 97% 97% 98% 100%  Weight: 131 lb 14.4 oz (59.8 kg)     Height:        Intake/Output Summary (Last 24 hours) at 06/19/2018 1110 Last data filed at 06/19/2018 1025 Gross per 24 hour  Intake 840 ml  Output 1000 ml  Net -160 ml   Filed Weights   06/17/18 0154 06/18/18 0300 06/19/18 0526  Weight: 131 lb 11.2 oz (59.7 kg) 132 lb 1.6 oz (59.9 kg) 131 lb 14.4 oz (59.8 kg)    Telemetry    nsr  - Personally Reviewed  ECG    none - Personally Reviewed  Physical Exam   GEN: No acute distress.   Neck: 7 cmJVD Cardiac: RRR, no murmurs, rubs, or gallops.  Respiratory: Clear to auscultation bilaterally. GI: Soft, nontender, non-distended  MS: No edema; No deformity. Neuro:  Nonfocal  Psych: Normal  affect   Labs    Chemistry Recent Labs  Lab 06/17/18 0319 06/18/18 0601 06/19/18 0616 06/19/18 0923  NA 130* 131* 131*  --   K 3.9 4.2 4.6  --   CL 91* 93* 95*  --   CO2 26 27 26   --   GLUCOSE 120* 114* 99  --   BUN 46* 49* 56*  --   CREATININE 2.19* 2.35* 2.64*  --   CALCIUM 8.8* 9.3 9.1  --   PROT 5.8* 6.1*  --  6.3*  ALBUMIN 2.9* 3.0*  --  3.1*  AST 286* 246*  --  PENDING  ALT 367* 365*  --  462*  ALKPHOS 78 95  --  95  BILITOT 1.3* 1.3*  --  1.7*  GFRNONAA 22* 20* 17*  --   GFRAA 25* 23* 20*  --   ANIONGAP 13 11 10   --      Hematology Recent Labs  Lab 06/15/18 1616 06/16/18 1019  WBC 8.2 6.7  RBC 3.88 3.98  HGB 11.3* 11.6*  HCT 35.3* 36.9  MCV 91.0 92.7  MCH 29.1 29.1  MCHC 32.0 31.4  RDW 17.2* 17.3*  PLT 143* 136*    Cardiac Enzymes Recent Labs  Lab 06/15/18 1616 06/16/18 0255 06/16/18 1019 06/16/18 1437  TROPONINI 0.04* 0.04* 0.03* 0.03*   No results for input(s): TROPIPOC in the last 168 hours.   BNP Recent Labs  Lab 06/15/18 1617 06/18/18 0601  BNP 2,878.7* 2,926.1*     DDimer No results for input(s): DDIMER in the last 168 hours.   Radiology    [place] Ekg Site Rite  Result Date: 06/19/2018 If [facility] image not attached, placement could not be confirmed due to current cardiac rhythm.   Cardiac Studies   Echo reviewed  Patient Profile     67 y.o. female admitted with volume overload and now with worsening renal insufficiency  Assessment & Plan    1. Acute on chronic systolic heart failure - she will need ionotropic therapy at this point as her creatinine is worsening and her weight remains above her baseline. I will start IV milrinone at low dose. 2. Acute on chronic stage 4 renal insuff - she will likely due better with increase cardiac output.  3. ICD - her device appears to be working normally. She might be someone that would benefit from a biv upgrade. We will discuss with Dr. Reine Just.      For questions or updates, please  contact [place] Please consult [email or web address] for contact info under Cardiology/STEMI.      Signed, Cristopher Peru, MD  06/19/2018, 11:10 AM  Patient ID: Nadara Mustard, female   DOB: 1951/04/01, 67 y.o.   MRN: [id number]

## 2018-06-19 NOTE — Progress Notes (Signed)
Patient still having intermittent nausea and has stomach distention.

## 2018-06-19 NOTE — Progress Notes (Signed)
IV team member called-  Need a nephrologist to ok PICC placement.  They did see in notes where Dr. Aundra Dubin did not think she was a candidate for dialysis, however they do need a Nephrologist to give final ok.  Paged the Cardiology to let them know.

## 2018-06-20 DIAGNOSIS — R1010 Upper abdominal pain, unspecified: Secondary | ICD-10-CM

## 2018-06-20 LAB — HEPATIC FUNCTION PANEL
ALT: 305 U/L — ABNORMAL HIGH (ref 0–44)
AST: 167 U/L — ABNORMAL HIGH (ref 15–41)
Albumin: 2.7 g/dL — ABNORMAL LOW (ref 3.5–5.0)
Alkaline Phosphatase: 81 U/L (ref 38–126)
BILIRUBIN TOTAL: 1.5 mg/dL — AB (ref 0.3–1.2)
Bilirubin, Direct: 0.4 mg/dL — ABNORMAL HIGH (ref 0.0–0.2)
Indirect Bilirubin: 1.1 mg/dL — ABNORMAL HIGH (ref 0.3–0.9)
TOTAL PROTEIN: 5.3 g/dL — AB (ref 6.5–8.1)

## 2018-06-20 LAB — BASIC METABOLIC PANEL
ANION GAP: 9 (ref 5–15)
BUN: 46 mg/dL — ABNORMAL HIGH (ref 8–23)
CALCIUM: 8.4 mg/dL — AB (ref 8.9–10.3)
CHLORIDE: 95 mmol/L — AB (ref 98–111)
CO2: 29 mmol/L (ref 22–32)
Creatinine, Ser: 2.14 mg/dL — ABNORMAL HIGH (ref 0.44–1.00)
GFR calc non Af Amer: 22 mL/min — ABNORMAL LOW (ref >60)
GFR, EST AFRICAN AMERICAN: 26 mL/min — AB (ref >60)
Glucose, Bld: 126 mg/dL — ABNORMAL HIGH (ref 70–99)
POTASSIUM: 3.2 mmol/L — AB (ref 3.5–5.1)
Sodium: 133 mmol/L — ABNORMAL LOW (ref 135–145)

## 2018-06-20 LAB — COOXEMETRY PANEL
CARBOXYHEMOGLOBIN: 1.5 % (ref 0.5–1.5)
Methemoglobin: 1.5 % (ref 0.0–1.5)
O2 SAT: 56.1 %
Total hemoglobin: 10 g/dL — ABNORMAL LOW (ref 12.0–16.0)

## 2018-06-20 MED ORDER — SPIRONOLACTONE 12.5 MG HALF TABLET
12.5000 mg | ORAL_TABLET | Freq: Every day | ORAL | Status: DC
  Administered 2018-06-20 – 2018-06-21 (×2): 12.5 mg via ORAL
  Filled 2018-06-20 (×4): qty 1

## 2018-06-20 MED ORDER — FLUTICASONE PROPIONATE 50 MCG/ACT NA SUSP
2.0000 | Freq: Every day | NASAL | Status: DC
  Administered 2018-06-21 – 2018-06-27 (×8): 2 via NASAL

## 2018-06-20 MED ORDER — POTASSIUM CHLORIDE CRYS ER 20 MEQ PO TBCR
40.0000 meq | EXTENDED_RELEASE_TABLET | ORAL | Status: AC
  Administered 2018-06-20 (×2): 40 meq via ORAL
  Filled 2018-06-20 (×2): qty 2

## 2018-06-20 MED ORDER — ONDANSETRON HCL 4 MG/2ML IJ SOLN
4.0000 mg | Freq: Four times a day (QID) | INTRAMUSCULAR | Status: DC | PRN
  Administered 2018-06-20 – 2018-06-27 (×4): 4 mg via INTRAVENOUS
  Filled 2018-06-20 (×4): qty 2

## 2018-06-20 NOTE — Plan of Care (Signed)
  Problem: Education: Goal: Knowledge of General Education information will improve Outcome: Progressing   Problem: Pain Managment: Goal: General experience of comfort will improve Outcome: Progressing   Problem: Activity: Goal: Capacity to carry out activities will improve Outcome: Progressing

## 2018-06-20 NOTE — Progress Notes (Signed)
[facility]  PROGRESS NOTE  [name] LFY:[id number] DOB: 1951-10-18 DOA: 06/15/2018 PCP: [facility]   Brief HPI:   67 year old female with a history of hypertension, hyperlipidemia, asthma, GERD, depression, CAD, MI, CHF with EF 20%, tobacco abuse, CKD stage III, ICD placement, iron deficiency anemia came to hospital with nausea vomiting abdominal pain for the past 3 days.  Patient was seen by general surgery and also underwent HIDA scan which was negative for biliary disease.  Patient still complains of nausea.  She does have a history of GERD.  LFTs are still elevated.  Hepatitis panel has been obtained    Subjective   Patient seen and examined, denies shortness of breath.  Nausea has improved.  Patient started on milrinone infusion along with Lasix drip yesterday.  LFTs are improved this morning.   Assessment/Plan:     1.  Intractable nausea/epigastric pain-improving likely from hepatic congestion due to CHF.  I called and discussed with Dr. Benson Norway, who agrees with diagnosis.  CT abdomen is negative, HIDA scan negative.  General surgery has signed off.  We will continue with IV diuresis with Lasix.  Continue PRN Zofran for nausea and vomiting. 2. Acute systolic CHF-patient's ejection fraction is only 20% as per echo from 05/2018.  BNP still elevated to 2,926 continue she was started on Lasix infusion along with milrinone per cardiology.  Good diuresis.   Echo shows right atrium dilated, right ventricle mild to moderately dilated.  3. Abnormal LFTs-improving, likely from hepatic congestion, discussed with GI.  Hepatitis panel is negative, HIDA scan shows possible hepatocellular disease 4. Hyperlipidemia-Crestor is on hold due to abnormal LFTs, continue Zetia 5. Acute kidney injury on CKD stage III-on Lasix infusion, milrinone infusion creatinine improved to 2.14 today.  Follow BMP in am. 6. Hypokalemia-potassium is 3.2 this morning, will give 2 doses  of K. Dur 40 meq p.o. 4 hours apart.  Check BMP in a.m. 7. CAD-stable patient had mild elevation of troponin likely from demand ischemia due to CHF exacerbation.  Aspirin is currently on hold due to rectal bleeding 8. Rectal bleeding-likely from hemorrhoids, general surgery has signed off. 9. Hyponatremia-likely from underlying hypernatremic hyponatremia due to CHF.  Has been started on IV Lasix.  Today sodium is 133.    DVT prophylaxis: SCDs  Code Status: Full code  Family Communication: No family at bedside  Disposition Plan: likely home when medically ready for discharge   Consultants:  General surgery  Procedures:  HIDA scan   Antibiotics:   Anti-infectives (From admission, onward)   None       Objective   Vitals:   06/20/18 0700 06/20/18 0742 06/20/18 0800 06/20/18 0854  BP:  (!) 102/58    Pulse: (!) 49 90  95  Resp: 19 19  17   Temp:  97.8 F (36.6 C)    TempSrc:  Oral    SpO2: 96% 98%    Weight:   57 kg (125 lb 10.6 oz)   Height:        Intake/Output Summary (Last 24 hours) at 06/20/2018 1121 Last data filed at 06/20/2018 1100 Gross per 24 hour  Intake 281.9 ml  Output 750 ml  Net -468.1 ml   Filed Weights   06/19/18 0526 06/20/18 0300 06/20/18 0800  Weight: 59.8 kg (131 lb 14.4 oz) 64.6 kg (142 lb 6.7 oz) 57 kg (125 lb 10.6 oz)     Physical Examination:   Lungs: Normal respiratory effort, bilateral clear to  auscultation, no crackles or wheezes.  Heart: Regular rate and rhythm, S1 and S2 normal, no murmurs, rubs auscultated Abdomen: BS normoactive,soft,nondistended,non-tender to palpation,no organomegaly Extremities: Trace edema of the lower extremities bilaterally. Neuro : Alert and oriented to time, place and person, No focal deficits      Data Reviewed: I have personally reviewed following labs and imaging studies  CBG: Recent Labs  Lab 06/16/18 1215  GLUCAP 111*    CBC: Recent Labs  Lab 06/15/18 1616 06/16/18 1019  WBC  8.2 6.7  HGB 11.3* 11.6*  HCT 35.3* 36.9  MCV 91.0 92.7  PLT 143* 136*    Basic Metabolic Panel: Recent Labs  Lab 06/16/18 1019 06/17/18 0319 06/18/18 0601 06/19/18 0616 06/20/18 0453  NA 130* 130* 131* 131* 133*  K 4.6 3.9 4.2 4.6 3.2*  CL 92* 91* 93* 95* 95*  CO2 26 26 27 26 29   GLUCOSE 98 120* 114* 99 126*  BUN 41* 46* 49* 56* 46*  CREATININE 1.88* 2.19* 2.35* 2.64* 2.14*  CALCIUM 9.2 8.8* 9.3 9.1 8.4*    No results found for this or any previous visit (from the past 240 hour(s)).   Liver Function Tests: Recent Labs  Lab 06/16/18 1019 06/17/18 0319 06/18/18 0601 06/19/18 0923 06/20/18 0453  AST 388* 286* 246* 310* 167*  ALT 384* 367* 365* 462* 305*  ALKPHOS 83 78 95 95 81  BILITOT 1.4* 1.3* 1.3* 1.7* 1.5*  PROT 6.0* 5.8* 6.1* 6.3* 5.3*  ALBUMIN 3.1* 2.9* 3.0* 3.1* 2.7*   Recent Labs  Lab 06/15/18 1616  LIPASE 57*   Recent Labs  Lab 06/16/18 0255  AMMONIA 24    Cardiac Enzymes: Recent Labs  Lab 06/15/18 1616 06/16/18 0255 06/16/18 1019 06/16/18 1437  TROPONINI 0.04* 0.04* 0.03* 0.03*   BNP (last 3 results) Recent Labs    06/04/18 0132 06/15/18 1617 06/18/18 0601  BNP 3,135.7* 0,998.3* 2,926.1*    ProBNP (last 3 results) No results for input(s): PROBNP in the last 8760 hours.    Studies: [place] Ekg Site Rite  Result Date: 06/19/2018 If Site Rite image not attached, placement could not be confirmed due to current cardiac rhythm.  [place] Ekg Site Rite  Result Date: 06/19/2018 If Site Rite image not attached, placement could not be confirmed due to current cardiac rhythm.   Scheduled Meds: . aspirin  81 mg Oral Daily  . azelastine  1 spray Each Nare Daily  . citalopram  20 mg Oral Daily  . ezetimibe  5 mg Oral Once per day on Mon Thu  . famotidine  20 mg Oral Daily  . ferrous sulfate  325 mg Oral Q breakfast  . [START ON 06/21/2018] fluticasone  2 spray Each Nare Daily  . isosorbide mononitrate  30 mg Oral Daily  . loratadine  10 mg  Oral Daily  . mometasone-formoterol  2 puff Inhalation BID  . nicotine  21 mg Transdermal Daily  . omega-3 acid ethyl esters  2 g Oral Daily  . pantoprazole  40 mg Oral Daily  . potassium chloride  40 mEq Oral Q4H  . rosuvastatin  5 mg Oral QODAY  . sodium chloride flush  10-40 mL Intracatheter Q12H  . spironolactone  12.5 mg Oral Daily      Time spent: 20 min  [facility] Pager [phone]. If 7PM-7AM, please contact night-coverage at [email or web address], Office  [phone]  password [email or web address]  06/20/2018, 11:21 AM  LOS: 4 days

## 2018-06-20 NOTE — Progress Notes (Signed)
ICM remote transmission rescheduled from 06/13/2018 due to patient inpatient to 07/11/2018.

## 2018-06-20 NOTE — Progress Notes (Addendum)
Advanced Heart Failure Rounding Note  PCP-Cardiologist: Jenkins Rouge, MD   Subjective:    Started on milrinone 0.25 mcg/kg/min and lasix drip at 12 mg/hr yesterday. Coox 56%.   Weight is down 6 lbs on standing scale. CVP 15. Creatinine trending down 2.14 this am, K 3.2. LFTs trending down, but still elevated.   Minimal UOP charted, but she says she urinated a lot. Denies CP. Some SOB with activities. Feels stronger today.   Objective:   Weight Range: 142 lb 6.7 oz (64.6 kg) Body mass index is 24.83 kg/m.   Vital Signs:   Temp:  [97.5 F (36.4 C)-98.1 F (36.7 C)] 97.8 F (36.6 C) (07/15 0742) Pulse Rate:  [49-96] 90 (07/15 0742) Resp:  [2-25] 19 (07/15 0742) BP: (88-125)/(46-86) 102/58 (07/15 0742) SpO2:  [94 %-100 %] 98 % (07/15 0742) Weight:  [142 lb 6.7 oz (64.6 kg)] 142 lb 6.7 oz (64.6 kg) (07/15 0300) Last BM Date: 06/18/18  Weight change: Filed Weights   06/18/18 0300 06/19/18 0526 06/20/18 0300  Weight: 132 lb 1.6 oz (59.9 kg) 131 lb 14.4 oz (59.8 kg) 142 lb 6.7 oz (64.6 kg)    Intake/Output:   Intake/Output Summary (Last 24 hours) at 06/20/2018 0804 Last data filed at 06/20/2018 0600 Gross per 24 hour  Intake 456.82 ml  Output 450 ml  Net 6.82 ml      Physical Exam   CVP 15  General:   No resp difficulty HEENT: Normal Neck: Supple. JVP 15. Carotids 2+ bilat; no bruits. No lymphadenopathy or thyromegaly appreciated. Cor: PMI lateral. Regular rate & rhythm. 1/6 HSM apex.  Lungs: clear, diminished throughout.  Abdomen: Soft, nontender, nondistended. No hepatosplenomegaly. No bruits or masses. Good bowel sounds. Extremities: No cyanosis, clubbing, rash, 1-2+ edema BLE, warm Neuro: Alert & orientedx3, cranial nerves grossly intact. moves all 4 extremities w/o difficulty. Affect pleasant   Telemetry   NSR 80-90s. Personally reviewed.   EKG    No new tracings.   Labs    CBC No results for input(s): WBC, NEUTROABS, HGB, HCT, MCV, PLT in the  last 72 hours. Basic Metabolic Panel Recent Labs    06/19/18 0616 06/20/18 0453  NA 131* 133*  K 4.6 3.2*  CL 95* 95*  CO2 26 29  GLUCOSE 99 126*  BUN 56* 46*  CREATININE 2.64* 2.14*  CALCIUM 9.1 8.4*   Liver Function Tests Recent Labs    06/19/18 0923 06/20/18 0453  AST 310* 167*  ALT 462* 305*  ALKPHOS 95 81  BILITOT 1.7* 1.5*  PROT 6.3* 5.3*  ALBUMIN 3.1* 2.7*   No results for input(s): LIPASE, AMYLASE in the last 72 hours. Cardiac Enzymes No results for input(s): CKTOTAL, CKMB, CKMBINDEX, TROPONINI in the last 72 hours.  BNP: BNP (last 3 results) Recent Labs    06/04/18 0132 06/15/18 1617 06/18/18 0601  BNP 3,135.7* 1,884.1* 2,926.1*    ProBNP (last 3 results) No results for input(s): PROBNP in the last 8760 hours.   D-Dimer No results for input(s): DDIMER in the last 72 hours. Hemoglobin A1C No results for input(s): HGBA1C in the last 72 hours. Fasting Lipid Panel No results for input(s): CHOL, HDL, LDLCALC, TRIG, CHOLHDL, LDLDIRECT in the last 72 hours. Thyroid Function Tests No results for input(s): TSH, T4TOTAL, T3FREE, THYROIDAB in the last 72 hours.  Invalid input(s): FREET3  Other results:   Imaging    [place] Ekg Site Rite  Result Date: 06/19/2018 If Site Rite image not attached, placement  could not be confirmed due to current cardiac rhythm.  [place] Ekg Site Rite  Result Date: 06/19/2018 If Site Rite image not attached, placement could not be confirmed due to current cardiac rhythm.     Medications:     Scheduled Medications: . aspirin  81 mg Oral Daily  . azelastine  1 spray Each Nare Daily  . citalopram  20 mg Oral Daily  . ezetimibe  5 mg Oral Once per day on Mon Thu  . famotidine  20 mg Oral Daily  . ferrous sulfate  325 mg Oral Q breakfast  . fluticasone  2 spray Each Nare Daily  . isosorbide mononitrate  30 mg Oral Daily  . loratadine  10 mg Oral Daily  . mometasone-formoterol  2 puff Inhalation BID  . nicotine  21 mg  Transdermal Daily  . omega-3 acid ethyl esters  2 g Oral Daily  . ondansetron (ZOFRAN) IV  4 mg Intravenous Q6H  . pantoprazole  40 mg Oral Daily  . potassium chloride  40 mEq Oral Q4H  . rosuvastatin  5 mg Oral QODAY  . sodium chloride flush  10-40 mL Intracatheter Q12H     Infusions: . furosemide (LASIX) infusion 12 mg/hr (06/20/18 0600)  . milrinone 0.25 mcg/kg/min (06/20/18 0757)     PRN Medications:  acetaminophen, hydrALAZINE, morphine injection, nitroGLYCERIN, polyethylene glycol, sodium chloride flush, zolpidem    Patient Profile   Patient has long history of CAD with prior anterior MI. Last cath in 8/15 with nonobstructive disease. She has ischemic cardiomyopathy with extensive scarring by 12/09 cardiac MRI.  Most recent echo was done this admission, showing EF 15% with moderate LV dilation and mildly decreased RV systolic function.  She additionally is an active smoker with COPD.  Baseline creatinine around 1.8, CKD stage 3.   Admitted with volume overload with poor response to lasix.   Assessment/Plan   1. Acute on chronic systolic CHF: Ischemic cardiomyopathy, echo in 7/19 with EF 15%, mildly decreased RV systolic function.  Symptoms have been worsening over a number of weeks, had recent admission at the end of June but probably was not fully diuresed prior to discharge.  Today, she is markedly volume overloaded on exam.  I suspect that her GI symptoms are due to RV failure with congestive hepatopathy/elevated LFTs.  - Volume status improving. CVP 15 - Continue milrinone 0.25 mcg/kg/min. Coox 56%. SBP 90-100s - Continue lasix drip 12 mg/hr. - No beta blocker with concern for low output.   - No ACEI/ARB/ARNI/digoxin with AKI. Add spironolactone 12.5 today with improving creatinine and low K.  - Overall picture, as above, is concerning for low output HF with RV failure and cardiorenal syndrome.  If we can stabilize her, LVAD may be needed in the future.  She would not be  a transplant candidate with active smoking and age.  2. AKI on CKD stage 3: She is clearly volume overloaded.  As above, concerned for cardiorenal syndrome with low output HF.  Hopefully milrinone + diuresis with lowering renal venous pressure will help.  - Creatinine trending down 2.14 this am.  3. 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  Workup for gallbladder disease unremarkable.  Follow closely, hopefully will improve with diuresis.  - LFTs remain elevated, but trending down.  4. COPD: Emphysema on CT chest.  Needs to quit smoking (discussed). No change.  5. CAD: As above, nonobstructive disease on last cath in 8/15.  No chest pain.   - Continue ASA  81 daily.  - Continue Crestor 5 mg qod (cannot tolerate higher dose).  6. PAD: Chronic claudication with left SFA occluded. No change.  7. Carotid stenosis: Moderate RICA stenosis on 9/18 dopplers. No change.  8. Hypokalemia - K 3.2. Supp already ordered.   Length of Stay: [place], NP  06/20/2018, 8:04 AM  Advanced Heart Failure Team Pager [phone] (M-F; 7a - 4p)  Please contact [facility] for night-coverage after hours (4p -7a ) and weekends on [email or web address]  Patient seen with NP, agree with the above note.  She seems to have diuresed some as weight is down but I/Os not well-recorded.  She says she urinated a lot. She says that she feels better overall today. LFTs also trending down.   Creatinine improving, co-ox 56% this morning.  CVP 15.   On exam, JVP 15-16 cm, regular S1S2 with 2/6 HSM LLSB.  1+ edema to knees.   Continue milrinone 0.25 today and Lasix 12 mg/hr.  Need to record accurate I/Os.  With improvement in creatinine, she can start spironolactone 12.5 daily.  If creatinine continues to trend down, digoxin tomorrow.   RHC when volume status optimized.   Mobilize, walk in halls.   Loralie Champagne 06/20/2018 9:41 AM

## 2018-06-21 LAB — HEPATIC FUNCTION PANEL
ALT: 260 U/L — AB (ref 0–44)
AST: 114 U/L — ABNORMAL HIGH (ref 15–41)
Albumin: 2.9 g/dL — ABNORMAL LOW (ref 3.5–5.0)
Alkaline Phosphatase: 90 U/L (ref 38–126)
BILIRUBIN INDIRECT: 0.8 mg/dL (ref 0.3–0.9)
Bilirubin, Direct: 0.4 mg/dL — ABNORMAL HIGH (ref 0.0–0.2)
TOTAL PROTEIN: 5.7 g/dL — AB (ref 6.5–8.1)
Total Bilirubin: 1.2 mg/dL (ref 0.3–1.2)

## 2018-06-21 LAB — COOXEMETRY PANEL
CARBOXYHEMOGLOBIN: 1.5 % (ref 0.5–1.5)
METHEMOGLOBIN: 1.5 % (ref 0.0–1.5)
O2 Saturation: 66.1 %
Total hemoglobin: 10.4 g/dL — ABNORMAL LOW (ref 12.0–16.0)

## 2018-06-21 LAB — CBC WITH DIFFERENTIAL/PLATELET
ABS IMMATURE GRANULOCYTES: 0 10*3/uL (ref 0.0–0.1)
BASOS PCT: 0 %
Basophils Absolute: 0 10*3/uL (ref 0.0–0.1)
EOS ABS: 0 10*3/uL (ref 0.0–0.7)
Eosinophils Relative: 1 %
HCT: 33.8 % — ABNORMAL LOW (ref 36.0–46.0)
Hemoglobin: 10.3 g/dL — ABNORMAL LOW (ref 12.0–15.0)
Lymphocytes Relative: 11 %
Lymphs Abs: 0.7 10*3/uL (ref 0.7–4.0)
MCH: 28 pg (ref 26.0–34.0)
MCHC: 30.5 g/dL (ref 30.0–36.0)
MCV: 91.8 fL (ref 78.0–100.0)
MONOS PCT: 12 %
Monocytes Absolute: 0.7 10*3/uL (ref 0.1–1.0)
NEUTROS PCT: 76 %
Neutro Abs: 4.9 10*3/uL (ref 1.7–7.7)
PLATELETS: 107 10*3/uL — AB (ref 150–400)
RBC: 3.68 MIL/uL — ABNORMAL LOW (ref 3.87–5.11)
RDW: 17.8 % — ABNORMAL HIGH (ref 11.5–15.5)
WBC: 6.4 10*3/uL (ref 4.0–10.5)

## 2018-06-21 LAB — BASIC METABOLIC PANEL
ANION GAP: 12 (ref 5–15)
BUN: 32 mg/dL — ABNORMAL HIGH (ref 8–23)
CALCIUM: 8.4 mg/dL — AB (ref 8.9–10.3)
CO2: 29 mmol/L (ref 22–32)
Chloride: 94 mmol/L — ABNORMAL LOW (ref 98–111)
Creatinine, Ser: 1.74 mg/dL — ABNORMAL HIGH (ref 0.44–1.00)
GFR calc non Af Amer: 29 mL/min — ABNORMAL LOW (ref >60)
GFR, EST AFRICAN AMERICAN: 33 mL/min — AB (ref >60)
Glucose, Bld: 130 mg/dL — ABNORMAL HIGH (ref 70–99)
POTASSIUM: 3.9 mmol/L (ref 3.5–5.1)
Sodium: 135 mmol/L (ref 135–145)

## 2018-06-21 MED ORDER — DIGOXIN 125 MCG PO TABS
0.1250 mg | ORAL_TABLET | Freq: Every day | ORAL | Status: DC
  Administered 2018-06-21 – 2018-06-28 (×8): 0.125 mg via ORAL
  Filled 2018-06-21 (×8): qty 1

## 2018-06-21 MED ORDER — POTASSIUM CHLORIDE CRYS ER 20 MEQ PO TBCR
40.0000 meq | EXTENDED_RELEASE_TABLET | Freq: Once | ORAL | Status: AC
  Administered 2018-06-21: 40 meq via ORAL
  Filled 2018-06-21: qty 2

## 2018-06-21 MED ORDER — ASPIRIN 81 MG PO CHEW
81.0000 mg | CHEWABLE_TABLET | ORAL | Status: AC
  Administered 2018-06-22: 81 mg via ORAL
  Filled 2018-06-21: qty 1

## 2018-06-21 MED ORDER — METOLAZONE 2.5 MG PO TABS
2.5000 mg | ORAL_TABLET | Freq: Once | ORAL | Status: AC
Start: 2018-06-21 — End: 2018-06-21
  Administered 2018-06-21: 2.5 mg via ORAL
  Filled 2018-06-21: qty 1

## 2018-06-21 MED ORDER — SODIUM CHLORIDE 0.9 % IV SOLN
INTRAVENOUS | Status: DC
  Administered 2018-06-22: 07:00:00 via INTRAVENOUS

## 2018-06-21 NOTE — Progress Notes (Addendum)
Advanced Heart Failure Rounding Note  PCP-Cardiologist: Jenkins Rouge, MD   Subjective:    Remains on milrinone 0.25 mcg/kg/min and lasix drip at 12 mg/hr. Coox 66%.   Brisk diuresis with -2 L, but weight up 4 lbs (suspect yesterday's weight was not accurate). CVP 15-16. Creatinine trending down 1.74. LFTs trending down.  Denies CP. Mildly dizzy with standing. SOB is improving.   Objective:   Weight Range: 129 lb 12.8 oz (58.9 kg) Body mass index is 22.63 kg/m.   Vital Signs:   Temp:  [97.7 F (36.5 C)-97.9 F (36.6 C)] 97.9 F (36.6 C) (07/16 0248) Pulse Rate:  [90-98] 98 (07/16 0248) Resp:  [16-19] 16 (07/16 0727) BP: (97-113)/(56-68) 109/60 (07/16 0500) SpO2:  [96 %-99 %] 99 % (07/16 0248) Weight:  [125 lb 10.6 oz (57 kg)-129 lb 12.8 oz (58.9 kg)] 129 lb 12.8 oz (58.9 kg) (07/16 0454) Last BM Date: 06/18/18  Weight change: Filed Weights   06/20/18 0300 06/20/18 0800 06/21/18 0454  Weight: 142 lb 6.7 oz (64.6 kg) 125 lb 10.6 oz (57 kg) 129 lb 12.8 oz (58.9 kg)    Intake/Output:   Intake/Output Summary (Last 24 hours) at 06/21/2018 0733 Last data filed at 06/21/2018 0723 Gross per 24 hour  Intake 680.38 ml  Output 3000 ml  Net -2319.62 ml      Physical Exam   CVP 15-16 General:  No resp difficulty. HEENT: Normal Neck: Supple. JVP 15-16. Carotids 2+ bilat; no bruits. No thyromegaly or nodule noted. Cor: PMI lateral. RRR, 1/6 HSM apex Lungs: diminished in bases.  Abdomen: Soft, non-tender, non-distended, no HSM. No bruits or masses. +BS  Extremities: No cyanosis, clubbing, or rash. R and LLE 1+ edema Neuro: Alert & orientedx3, cranial nerves grossly intact. moves all 4 extremities w/o difficulty. Affect pleasant   Telemetry   NSR 90s. Personally reviewed.   EKG    No new tracings.   Labs    CBC Recent Labs    06/21/18 0500  WBC 6.4  NEUTROABS 4.9  HGB 10.3*  HCT 33.8*  MCV 91.8  PLT 998*   Basic Metabolic Panel Recent Labs   06/20/18 0453 06/21/18 0500  NA 133* 135  K 3.2* 3.9  CL 95* 94*  CO2 29 29  GLUCOSE 126* 130*  BUN 46* 32*  CREATININE 2.14* 1.74*  CALCIUM 8.4* 8.4*   Liver Function Tests Recent Labs    06/20/18 0453 06/21/18 0500  AST 167* 114*  ALT 305* 260*  ALKPHOS 81 90  BILITOT 1.5* 1.2  PROT 5.3* 5.7*  ALBUMIN 2.7* 2.9*   No results for input(s): LIPASE, AMYLASE in the last 72 hours. Cardiac Enzymes No results for input(s): CKTOTAL, CKMB, CKMBINDEX, TROPONINI in the last 72 hours.  BNP: BNP (last 3 results) Recent Labs    06/04/18 0132 06/15/18 1617 06/18/18 0601  BNP 3,135.7* 3,382.5* 2,926.1*    ProBNP (last 3 results) No results for input(s): PROBNP in the last 8760 hours.   D-Dimer No results for input(s): DDIMER in the last 72 hours. Hemoglobin A1C No results for input(s): HGBA1C in the last 72 hours. Fasting Lipid Panel No results for input(s): CHOL, HDL, LDLCALC, TRIG, CHOLHDL, LDLDIRECT in the last 72 hours. Thyroid Function Tests No results for input(s): TSH, T4TOTAL, T3FREE, THYROIDAB in the last 72 hours.  Invalid input(s): FREET3  Other results:   Imaging    No results found.   Medications:     Scheduled Medications: . aspirin  81  mg Oral Daily  . azelastine  1 spray Each Nare Daily  . citalopram  20 mg Oral Daily  . ezetimibe  5 mg Oral Once per day on Mon Thu  . famotidine  20 mg Oral Daily  . ferrous sulfate  325 mg Oral Q breakfast  . fluticasone  2 spray Each Nare Daily  . isosorbide mononitrate  30 mg Oral Daily  . loratadine  10 mg Oral Daily  . mometasone-formoterol  2 puff Inhalation BID  . nicotine  21 mg Transdermal Daily  . omega-3 acid ethyl esters  2 g Oral Daily  . pantoprazole  40 mg Oral Daily  . rosuvastatin  5 mg Oral QODAY  . sodium chloride flush  10-40 mL Intracatheter Q12H  . spironolactone  12.5 mg Oral Daily    Infusions: . furosemide (LASIX) infusion 12 mg/hr (06/20/18 1823)  . milrinone 0.25  mcg/kg/min (06/21/18 0516)    PRN Medications: acetaminophen, hydrALAZINE, morphine injection, nitroGLYCERIN, ondansetron (ZOFRAN) IV, polyethylene glycol, sodium chloride flush, zolpidem    Patient Profile   Patient has long history of CAD with prior anterior MI. Last cath in 8/15 with nonobstructive disease. She has ischemic cardiomyopathy with extensive scarring by 12/09 cardiac MRI.  Most recent echo was done this admission, showing EF 15% with moderate LV dilation and mildly decreased RV systolic function.  She additionally is an active smoker with COPD.  Baseline creatinine around 1.8, CKD stage 3.   Admitted with volume overload with poor response to lasix.   Assessment/Plan   1. Acute on chronic systolic CHF: Ischemic cardiomyopathy, echo in 7/19 with EF 15%, mildly decreased RV systolic function, moderate MR, moderate TR.  Symptoms have been worsening over a number of weeks, had recent admission at the end of June but probably was not fully diuresed prior to discharge.  Today, she is markedly volume overloaded on exam.  I suspect that her GI symptoms are due to RV failure with congestive hepatopathy/elevated LFTs.  - Volume status elevated. CVP 15-16 - Continue milrinone 0.25 mcg/kg/min. Coox 66%. SBP 90-100s - Increase lasix drip to 15 mg/hr. Give 2.5 mg metolazone x1 today.  - Continue spiro 12.5 mg daily.  - Start digoxin 0.125 mg daily  - No beta blocker with low output.   - No ACEI/ARB/ARNI with AKI.  - Overall picture, as above, is concerning for low output HF with RV failure and cardiorenal syndrome.  If we can stabilize her, LVAD may be needed in the future.  She would not be a transplant candidate with active smoking and age.  - RHC when fully diuresed.  2. AKI on CKD stage 3: As above, concerned for cardiorenal syndrome with low output HF.  Hopefully milrinone + diuresis with lowering renal venous pressure will help.  - Creatinine trending down with milrinone and  diuresis, now 1.74 3. 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  Workup for gallbladder disease unremarkable.  Follow closely, hopefully will improve with diuresis.  - LFTs trending down. Still elevated.  4. COPD: Emphysema on CT chest.  Needs to quit smoking (discussed). No change 5. CAD: As above, nonobstructive disease on last cath in 8/15. No s/s ischemia.  - Continue ASA 81 daily.  - Continue Crestor 5 mg qod (cannot tolerate higher dose).  6. PAD: Chronic claudication with left SFA occluded. No change.   7. Carotid stenosis: Moderate RICA stenosis on 9/18 dopplers. No change.   8. Hypokalemia - K 3.9. Resolved.   Length  of Stay: [place], NP  06/21/2018, 7:33 AM  Advanced Heart Failure Team Pager [phone] (M-F; 7a - 4p)  Please contact [facility] for night-coverage after hours (4p -7a ) and weekends on [email or web address]  Patient seen with NP, agree with the above note.  She is diuresing though think weight yesterday was not accurate.  Co-ox good at 66% today.  CVP remains 15-16. She is feeling better, walking in halls.    Creatinine trending down, 1.74 this morning.   On exam, JVP 15+ cm, regular S1S2 with 2/6 HSM LLSB/apex.  1+ edema to knees.   Continue milrinone 0.25 today and increase Lasix to 15 mg/hr.  Will give dose of metolazone x 1.  Replace K.  Continue spironolactone.  With downtrending creatinine, will add digoxin today.   Will plan RHC when she is better-diuresed and possibly coronary angiography if creatinine continues to come down to rule out worsening CAD as cause of worsening HF.  Possibly Thursday.   Mobilize, walk in halls.   Loralie Champagne 06/21/2018 8:43 AM

## 2018-06-21 NOTE — Plan of Care (Signed)
  Problem: Education: Goal: Knowledge of General Education information will improve Outcome: Progressing   Problem: Clinical Measurements: Goal: Ability to maintain clinical measurements within normal limits will improve Outcome: Progressing Goal: Will remain free from infection Outcome: Progressing Goal: Respiratory complications will improve Outcome: Progressing Goal: Cardiovascular complication will be avoided Outcome: Progressing   Problem: Activity: Goal: Risk for activity intolerance will decrease Outcome: Progressing   Problem: Coping: Goal: Level of anxiety will decrease Outcome: Progressing   Problem: Elimination: Goal: Will not experience complications related to bowel motility Outcome: Progressing Goal: Will not experience complications related to urinary retention Outcome: Progressing   Problem: Pain Managment: Goal: General experience of comfort will improve Outcome: Progressing   Problem: Safety: Goal: Ability to remain free from injury will improve Outcome: Progressing   Problem: Skin Integrity: Goal: Risk for impaired skin integrity will decrease Outcome: Progressing   Problem: Education: Goal: Ability to demonstrate management of disease process will improve Outcome: Progressing Goal: Ability to verbalize understanding of medication therapies will improve Outcome: Progressing   Problem: Activity: Goal: Capacity to carry out activities will improve Outcome: Progressing   Problem: Cardiac: Goal: Ability to achieve and maintain adequate cardiopulmonary perfusion will improve Outcome: Progressing   Problem: Nutrition: Goal: Adequate nutrition will be maintained Outcome: Completed/Met

## 2018-06-21 NOTE — Progress Notes (Signed)
[facility]  PROGRESS NOTE  [name] LKG:[id number] DOB: 01-31-1951 DOA: 06/15/2018 PCP: [facility]   Brief HPI:   67 year old female with a history of hypertension, hyperlipidemia, asthma, GERD, depression, CAD, MI, CHF with EF 20%, tobacco abuse, CKD stage III, ICD placement, iron deficiency anemia came to hospital with nausea vomiting abdominal pain for the past 3 days.  Patient was seen by general surgery and also underwent HIDA scan which was negative for biliary disease.  Patient still complains of nausea.  She does have a history of GERD.  LFTs are still elevated.  Hepatitis panel has been obtained    Subjective   Patient seen and examined, denies shortness of breath.  Started on milrinone drip along with Lasix infusion.  Nausea has resolved.  LFTs are improving.   Assessment/Plan:     1.  Intractable nausea/epigastric pain-improving likely from hepatic congestion due to CHF.  I called and discussed with Dr. Benson Norway, who agrees with diagnosis.  CT abdomen is negative, HIDA scan negative.  General surgery has signed off.  We will continue with IV diuresis with Lasix.  Continue PRN Zofran for nausea and vomiting. 2. Acute systolic CHF-patient's ejection fraction is only 20% as per echo from 05/2018.  BNP still elevated to 2,926 continue she was started on Lasix infusion along with milrinone per cardiology.  Good diuresis.   Echo shows right atrium dilated, right ventricle mild to moderately dilated.  Net -2.9 L 3. Abnormal LFTs-improving, likely from hepatic congestion, discussed with GI.  Hepatitis panel is negative, HIDA scan shows possible hepatocellular disease.  Follow LFTs in a.m. 4. Hyperlipidemia-Crestor is on hold due to abnormal LFTs, continue Zetia 5. Acute kidney injury on CKD stage III-on Lasix infusion, milrinone infusion creatinine improved to  1.74 today.  Follow BMP in am. 6. Hypokalemia-replete, potassium is 3.9 this  morning. 7. CAD-stable patient had mild elevation of troponin likely from demand ischemia due to CHF exacerbation.  Aspirin is currently on hold due to rectal bleeding 8. Rectal bleeding-likely from hemorrhoids, general surgery has signed off. 9. Hyponatremia-likely from underlying hypernatremic hyponatremia due to CHF.  Has been started on IV Lasix.  Today sodium is 135.    DVT prophylaxis: SCDs  Code Status: Full code  Family Communication: No family at bedside  Disposition Plan: likely home when medically ready for discharge   Consultants:  General surgery  Procedures:  HIDA scan   Antibiotics:   Anti-infectives (From admission, onward)   None       Objective   Vitals:   06/21/18 0706 06/21/18 0727 06/21/18 0800 06/21/18 1000  BP: 110/75  109/65 (!) 99/58  Pulse: 60     Resp: 18 16 16 20   Temp: 97.6 F (36.4 C)     TempSrc: Oral     SpO2:      Weight:      Height:        Intake/Output Summary (Last 24 hours) at 06/21/2018 1147 Last data filed at 06/21/2018 1026 Gross per 24 hour  Intake 1125.95 ml  Output 3150 ml  Net -2024.05 ml   Filed Weights   06/20/18 0300 06/20/18 0800 06/21/18 0454  Weight: 64.6 kg (142 lb 6.7 oz) 57 kg (125 lb 10.6 oz) 58.9 kg (129 lb 12.8 oz)     Physical Examination:    General:  Appears in no acute distress  Cardiovascular: S1-S2, regular  Respiratory: Clear to auscultation bilaterally  Abdomen: Soft, nontender, no organomegaly  Musculoskeletal: Trace edema of the lower extremities      Data Reviewed: I have personally reviewed following labs and imaging studies  CBG: Recent Labs  Lab 06/16/18 1215  GLUCAP 111*    CBC: Recent Labs  Lab 06/15/18 1616 06/16/18 1019 06/21/18 0500  WBC 8.2 6.7 6.4  NEUTROABS  --   --  4.9  HGB 11.3* 11.6* 10.3*  HCT 35.3* 36.9 33.8*  MCV 91.0 92.7 91.8  PLT 143* 136* 107*    Basic Metabolic Panel: Recent Labs  Lab 06/17/18 0319 06/18/18 0601  06/19/18 0616 06/20/18 0453 06/21/18 0500  NA 130* 131* 131* 133* 135  K 3.9 4.2 4.6 3.2* 3.9  CL 91* 93* 95* 95* 94*  CO2 26 27 26 29 29   GLUCOSE 120* 114* 99 126* 130*  BUN 46* 49* 56* 46* 32*  CREATININE 2.19* 2.35* 2.64* 2.14* 1.74*  CALCIUM 8.8* 9.3 9.1 8.4* 8.4*    No results found for this or any previous visit (from the past 240 hour(s)).   Liver Function Tests: Recent Labs  Lab 06/17/18 0319 06/18/18 0601 06/19/18 0923 06/20/18 0453 06/21/18 0500  AST 286* 246* 310* 167* 114*  ALT 367* 365* 462* 305* 260*  ALKPHOS 78 95 95 81 90  BILITOT 1.3* 1.3* 1.7* 1.5* 1.2  PROT 5.8* 6.1* 6.3* 5.3* 5.7*  ALBUMIN 2.9* 3.0* 3.1* 2.7* 2.9*   Recent Labs  Lab 06/15/18 1616  LIPASE 57*   Recent Labs  Lab 06/16/18 0255  AMMONIA 24    Cardiac Enzymes: Recent Labs  Lab 06/15/18 1616 06/16/18 0255 06/16/18 1019 06/16/18 1437  TROPONINI 0.04* 0.04* 0.03* 0.03*   BNP (last 3 results) Recent Labs    06/04/18 0132 06/15/18 1617 06/18/18 0601  BNP 3,135.7* 8,110.3* 2,926.1*    ProBNP (last 3 results) No results for input(s): PROBNP in the last 8760 hours.    Studies: No results found.  Scheduled Meds: . aspirin  81 mg Oral Daily  . azelastine  1 spray Each Nare Daily  . citalopram  20 mg Oral Daily  . digoxin  0.125 mg Oral Daily  . ezetimibe  5 mg Oral Once per day on Mon Thu  . famotidine  20 mg Oral Daily  . ferrous sulfate  325 mg Oral Q breakfast  . fluticasone  2 spray Each Nare Daily  . isosorbide mononitrate  30 mg Oral Daily  . loratadine  10 mg Oral Daily  . mometasone-formoterol  2 puff Inhalation BID  . nicotine  21 mg Transdermal Daily  . omega-3 acid ethyl esters  2 g Oral Daily  . pantoprazole  40 mg Oral Daily  . rosuvastatin  5 mg Oral QODAY  . sodium chloride flush  10-40 mL Intracatheter Q12H  . spironolactone  12.5 mg Oral Daily      Time spent: 20 min  [facility] Pager [phone]. If 7PM-7AM,  please contact night-coverage at [email or web address], Office  [phone]  password [email or web address]  06/21/2018, 11:47 AM  LOS: 5 days

## 2018-06-21 NOTE — Progress Notes (Signed)
[id number] Came to see pt to walk. Pt stated she had walked earlier. She appeared SOB just talking while in bed. Pt stated she had just returned from bathroom trip. Noticed PVCs on monitor. Checked sats at 98%RA.  Encouraged pt to rest now and walk later this evening if she is feeling better. Saw pt on last admission. Appears more SOB now than when I saw her last. Graylon Good RN BSN 06/21/2018 2:32 PM

## 2018-06-22 ENCOUNTER — Encounter (HOSPITAL_COMMUNITY)
Admission: EM | Disposition: A | Discharge status: Home / Self Care | Payer: Self-pay | Source: Home / Self Care | Attending: Internal Medicine

## 2018-06-22 ENCOUNTER — Encounter (HOSPITAL_COMMUNITY): Payer: Self-pay | Admitting: Cardiology

## 2018-06-22 DIAGNOSIS — I509 Heart failure, unspecified: Secondary | ICD-10-CM

## 2018-06-22 HISTORY — PX: RIGHT/LEFT HEART CATH AND CORONARY ANGIOGRAPHY: CATH118266

## 2018-06-22 LAB — COOXEMETRY PANEL
Carboxyhemoglobin: 1.5 % (ref 0.5–1.5)
Methemoglobin: 1.3 % (ref 0.0–1.5)
O2 SAT: 55.9 %
TOTAL HEMOGLOBIN: 11 g/dL — AB (ref 12.0–16.0)

## 2018-06-22 LAB — CBC WITH DIFFERENTIAL/PLATELET
Abs Immature Granulocytes: 0.1 10*3/uL (ref 0.0–0.1)
BASOS ABS: 0 10*3/uL (ref 0.0–0.1)
Basophils Relative: 0 %
EOS ABS: 0 10*3/uL (ref 0.0–0.7)
EOS PCT: 0 %
HEMATOCRIT: 33.2 % — AB (ref 36.0–46.0)
HEMOGLOBIN: 10.3 g/dL — AB (ref 12.0–15.0)
Immature Granulocytes: 1 %
LYMPHS PCT: 13 %
Lymphs Abs: 0.8 10*3/uL (ref 0.7–4.0)
MCH: 28.8 pg (ref 26.0–34.0)
MCHC: 31 g/dL (ref 30.0–36.0)
MCV: 92.7 fL (ref 78.0–100.0)
MONO ABS: 0.7 10*3/uL (ref 0.1–1.0)
Monocytes Relative: 11 %
Neutro Abs: 4.5 10*3/uL (ref 1.7–7.7)
Neutrophils Relative %: 75 %
Platelets: 103 10*3/uL — ABNORMAL LOW (ref 150–400)
RBC: 3.58 MIL/uL — AB (ref 3.87–5.11)
RDW: 18 % — AB (ref 11.5–15.5)
WBC: 6.1 10*3/uL (ref 4.0–10.5)

## 2018-06-22 LAB — POCT I-STAT 3, VENOUS BLOOD GAS (G3P V)
Acid-Base Excess: 10 mmol/L — ABNORMAL HIGH (ref 0.0–2.0)
Acid-Base Excess: 12 mmol/L — ABNORMAL HIGH (ref 0.0–2.0)
BICARBONATE: 38 mmol/L — AB (ref 20.0–28.0)
Bicarbonate: 35.3 mmol/L — ABNORMAL HIGH (ref 20.0–28.0)
O2 SAT: 68 %
O2 Saturation: 69 %
PH VEN: 7.453 — AB (ref 7.250–7.430)
PO2 VEN: 35 mmHg (ref 32.0–45.0)
TCO2: 37 mmol/L — ABNORMAL HIGH (ref 22–32)
TCO2: 40 mmol/L — ABNORMAL HIGH (ref 22–32)
pCO2, Ven: 52.7 mmHg (ref 44.0–60.0)
pCO2, Ven: 54.4 mmHg (ref 44.0–60.0)
pH, Ven: 7.435 — ABNORMAL HIGH (ref 7.250–7.430)
pO2, Ven: 35 mmHg (ref 32.0–45.0)

## 2018-06-22 LAB — MAGNESIUM: MAGNESIUM: 2.2 mg/dL (ref 1.7–2.4)

## 2018-06-22 LAB — POCT I-STAT 3, ART BLOOD GAS (G3+)
ACID-BASE EXCESS: 12 mmol/L — AB (ref 0.0–2.0)
Acid-Base Excess: 12 mmol/L — ABNORMAL HIGH (ref 0.0–2.0)
Acid-Base Excess: 12 mmol/L — ABNORMAL HIGH (ref 0.0–2.0)
BICARBONATE: 37.6 mmol/L — AB (ref 20.0–28.0)
Bicarbonate: 37.6 mmol/L — ABNORMAL HIGH (ref 20.0–28.0)
Bicarbonate: 37.6 mmol/L — ABNORMAL HIGH (ref 20.0–28.0)
O2 SAT: 98 %
O2 SAT: 98 %
O2 Saturation: 98 %
TCO2: 39 mmol/L — AB (ref 22–32)
TCO2: 39 mmol/L — AB (ref 22–32)
TCO2: 39 mmol/L — ABNORMAL HIGH (ref 22–32)
pCO2 arterial: 52.9 mmHg — ABNORMAL HIGH (ref 32.0–48.0)
pCO2 arterial: 52.9 mmHg — ABNORMAL HIGH (ref 32.0–48.0)
pCO2 arterial: 52.9 mmHg — ABNORMAL HIGH (ref 32.0–48.0)
pH, Arterial: 7.46 — ABNORMAL HIGH (ref 7.350–7.450)
pH, Arterial: 7.46 — ABNORMAL HIGH (ref 7.350–7.450)
pH, Arterial: 7.46 — ABNORMAL HIGH (ref 7.350–7.450)
pO2, Arterial: 107 mmHg (ref 83.0–108.0)
pO2, Arterial: 107 mmHg (ref 83.0–108.0)
pO2, Arterial: 107 mmHg (ref 83.0–108.0)

## 2018-06-22 LAB — BASIC METABOLIC PANEL
ANION GAP: 11 (ref 5–15)
Anion gap: 11 (ref 5–15)
BUN: 23 mg/dL (ref 8–23)
BUN: 22 mg/dL (ref 8–23)
CALCIUM: 9.2 mg/dL (ref 8.9–10.3)
CHLORIDE: 87 mmol/L — AB (ref 98–111)
CO2: 37 mmol/L — ABNORMAL HIGH (ref 22–32)
CO2: 35 mmol/L — ABNORMAL HIGH (ref 22–32)
CREATININE: 1.47 mg/dL — AB (ref 0.44–1.00)
Calcium: 9 mg/dL (ref 8.9–10.3)
Chloride: 88 mmol/L — ABNORMAL LOW (ref 98–111)
Creatinine, Ser: 1.46 mg/dL — ABNORMAL HIGH (ref 0.44–1.00)
GFR calc Af Amer: 41 mL/min — ABNORMAL LOW (ref >60)
GFR, EST AFRICAN AMERICAN: 41 mL/min — AB (ref >60)
GFR, EST NON AFRICAN AMERICAN: 36 mL/min — AB (ref >60)
GFR, EST NON AFRICAN AMERICAN: 35 mL/min — AB (ref >60)
GLUCOSE: 136 mg/dL — AB (ref 70–99)
Glucose, Bld: 128 mg/dL — ABNORMAL HIGH (ref 70–99)
POTASSIUM: 3.5 mmol/L (ref 3.5–5.1)
Potassium: 2.8 mmol/L — ABNORMAL LOW (ref 3.5–5.1)
SODIUM: 135 mmol/L (ref 135–145)
Sodium: 134 mmol/L — ABNORMAL LOW (ref 135–145)

## 2018-06-22 SURGERY — RIGHT/LEFT HEART CATH AND CORONARY ANGIOGRAPHY
Anesthesia: LOCAL

## 2018-06-22 MED ORDER — ACETAMINOPHEN 325 MG PO TABS: 650.0000 mg | ORAL_TABLET | ORAL | Status: DC | PRN

## 2018-06-22 MED ORDER — SODIUM CHLORIDE 0.9% FLUSH
3.0000 mL | Freq: Two times a day (BID) | INTRAVENOUS | Status: DC
  Administered 2018-06-22 – 2018-06-27 (×3): 3 mL via INTRAVENOUS

## 2018-06-22 MED ORDER — IOHEXOL 350 MG/ML SOLN
INTRAVENOUS | Status: DC | PRN
  Administered 2018-06-22: 50 mL via INTRA_ARTERIAL

## 2018-06-22 MED ORDER — FENTANYL CITRATE (PF) 100 MCG/2ML IJ SOLN
INTRAMUSCULAR | Status: DC | PRN
  Administered 2018-06-22 (×2): 25 ug via INTRAVENOUS

## 2018-06-22 MED ORDER — HEPARIN SODIUM (PORCINE) 5000 UNIT/ML IJ SOLN
5000.0000 [IU] | Freq: Three times a day (TID) | INTRAMUSCULAR | Status: DC
  Administered 2018-06-22 – 2018-06-28 (×16): 5000 [IU] via SUBCUTANEOUS
  Filled 2018-06-22 (×16): qty 1

## 2018-06-22 MED ORDER — SODIUM CHLORIDE 0.9% FLUSH: 3.0000 mL | INTRAVENOUS | Status: DC | PRN

## 2018-06-22 MED ORDER — HEPARIN SODIUM (PORCINE) 1000 UNIT/ML IJ SOLN
INTRAMUSCULAR | Status: AC
  Filled 2018-06-22: qty 1

## 2018-06-22 MED ORDER — MIDAZOLAM HCL 2 MG/2ML IJ SOLN
INTRAMUSCULAR | Status: DC | PRN
  Administered 2018-06-22: 1 mg via INTRAVENOUS

## 2018-06-22 MED ORDER — SODIUM CHLORIDE 0.9 % IV SOLN: 250.0000 mL | INTRAVENOUS | Status: DC | PRN

## 2018-06-22 MED ORDER — VERAPAMIL HCL 2.5 MG/ML IV SOLN
INTRAVENOUS | Status: AC
  Filled 2018-06-22: qty 2

## 2018-06-22 MED ORDER — HEPARIN (PORCINE) IN NACL 1000-0.9 UT/500ML-% IV SOLN
INTRAVENOUS | Status: DC | PRN
  Administered 2018-06-22 (×2): 500 mL

## 2018-06-22 MED ORDER — LIDOCAINE HCL (PF) 1 % IJ SOLN
INTRAMUSCULAR | Status: DC | PRN
  Administered 2018-06-22: 16 mL

## 2018-06-22 MED ORDER — POTASSIUM CHLORIDE CRYS ER 20 MEQ PO TBCR
40.0000 meq | EXTENDED_RELEASE_TABLET | Freq: Two times a day (BID) | ORAL | Status: DC
  Administered 2018-06-22 – 2018-06-24 (×6): 40 meq via ORAL
  Filled 2018-06-22 (×6): qty 2

## 2018-06-22 MED ORDER — ONDANSETRON HCL 4 MG/2ML IJ SOLN: 4.0000 mg | Freq: Four times a day (QID) | INTRAMUSCULAR | Status: DC | PRN

## 2018-06-22 MED ORDER — HEPARIN (PORCINE) IN NACL 1000-0.9 UT/500ML-% IV SOLN
INTRAVENOUS | Status: AC
  Filled 2018-06-22: qty 1000

## 2018-06-22 MED ORDER — MIDAZOLAM HCL 2 MG/2ML IJ SOLN
INTRAMUSCULAR | Status: AC
  Filled 2018-06-22: qty 2

## 2018-06-22 MED ORDER — LIDOCAINE HCL (PF) 1 % IJ SOLN
INTRAMUSCULAR | Status: AC
  Filled 2018-06-22: qty 30

## 2018-06-22 MED ORDER — POTASSIUM CHLORIDE CRYS ER 20 MEQ PO TBCR
60.0000 meq | EXTENDED_RELEASE_TABLET | Freq: Once | ORAL | Status: AC
  Administered 2018-06-22: 60 meq via ORAL
  Filled 2018-06-22: qty 3

## 2018-06-22 MED ORDER — FENTANYL CITRATE (PF) 100 MCG/2ML IJ SOLN
INTRAMUSCULAR | Status: AC
  Filled 2018-06-22: qty 2

## 2018-06-22 SURGICAL SUPPLY — 11 items
CATH INFINITI 5FR MULTPACK ANG (CATHETERS) ×1 IMPLANT
CATH SWAN GANZ 7F STRAIGHT (CATHETERS) ×1 IMPLANT
KIT HEART LEFT (KITS) ×2 IMPLANT
PACK CARDIAC CATHETERIZATION (CUSTOM PROCEDURE TRAY) ×2 IMPLANT
SHEATH PINNACLE 5F 10CM (SHEATH) ×1 IMPLANT
SHEATH PINNACLE 7F 10CM (SHEATH) ×1 IMPLANT
TRANSDUCER W/STOPCOCK (MISCELLANEOUS) ×2 IMPLANT
TUBING ART PRESS 72  MALE/FEM (TUBING) ×1
TUBING ART PRESS 72 MALE/FEM (TUBING) IMPLANT
TUBING CIL FLEX 10 FLL-RA (TUBING) ×2 IMPLANT
WIRE EMERALD 3MM-J .035X150CM (WIRE) ×1 IMPLANT

## 2018-06-22 NOTE — Progress Notes (Addendum)
Advanced Heart Failure Rounding Note  PCP-Cardiologist: Jenkins Rouge, MD   Subjective:    Remains on milrinone 0.25 mcg/kg/min and lasix drip at 15 mg/hr. Coox 55%.   Great UOP with increased lasix drip + metolazone. -2.5 L and down 5 more lbs. CVP 11. Creatinine trending down 1.46, K 2.8.  Drowsy this morning. SOB is improving. Denies CP. Mild dizziness with walking. Going for [facility] today.   Objective:   Weight Range: 124 lb 3.2 oz (56.3 kg) Body mass index is 21.66 kg/m.   Vital Signs:   Temp:  [97.7 F (36.5 C)-98.2 F (36.8 C)] 98.2 F (36.8 C) (07/17 0300) Pulse Rate:  [89-96] 89 (07/17 0300) Resp:  [16-21] 18 (07/17 0300) BP: (95-110)/(58-65) 95/62 (07/17 0300) SpO2:  [98 %-100 %] 99 % (07/17 0300) Weight:  [124 lb 3.2 oz (56.3 kg)] 124 lb 3.2 oz (56.3 kg) (07/17 0500) Last BM Date: 06/18/18  Weight change: Filed Weights   06/20/18 0800 06/21/18 0454 06/22/18 0500  Weight: 125 lb 10.6 oz (57 kg) 129 lb 12.8 oz (58.9 kg) 124 lb 3.2 oz (56.3 kg)    Intake/Output:   Intake/Output Summary (Last 24 hours) at 06/22/2018 0711 Last data filed at 06/22/2018 0700 Gross per 24 hour  Intake 1386.31 ml  Output 4975 ml  Net -3588.69 ml      Physical Exam   CVP 11 General:  No resp difficulty. HEENT: Normal Neck: Supple. JVP 11. Carotids 2+ bilat; no bruits. No thyromegaly or nodule noted. Cor: PMI laterally displaced. RRR, 1/6 HSM apex Lungs: CTAB, normal effort. Abdomen: Soft, non-tender, non-distended, no HSM. No bruits or masses. +BS  Extremities: No cyanosis, clubbing, or rash. R and LLE no edema.  Neuro: Alert & orientedx3, cranial nerves grossly intact. moves all 4 extremities w/o difficulty. Affect pleasant  Telemetry   NSR 80-90s with occasional PVCs. Personally reviewed.   EKG    No new tracings.   Labs    CBC Recent Labs    06/21/18 0500 06/22/18 0529  WBC 6.4 6.1  NEUTROABS 4.9 4.5  HGB 10.3* 10.3*  HCT 33.8* 33.2*  MCV 91.8 92.7    PLT 107* 856*   Basic Metabolic Panel Recent Labs    06/21/18 0500 06/22/18 0529  NA 135 135  K 3.9 2.8*  CL 94* 87*  CO2 29 37*  GLUCOSE 130* 128*  BUN 32* 23  CREATININE 1.74* 1.46*  CALCIUM 8.4* 9.0   Liver Function Tests Recent Labs    06/20/18 0453 06/21/18 0500  AST 167* 114*  ALT 305* 260*  ALKPHOS 81 90  BILITOT 1.5* 1.2  PROT 5.3* 5.7*  ALBUMIN 2.7* 2.9*   No results for input(s): LIPASE, AMYLASE in the last 72 hours. Cardiac Enzymes No results for input(s): CKTOTAL, CKMB, CKMBINDEX, TROPONINI in the last 72 hours.  BNP: BNP (last 3 results) Recent Labs    06/04/18 0132 06/15/18 1617 06/18/18 0601  BNP 3,135.7* 3,149.7* 2,926.1*    ProBNP (last 3 results) No results for input(s): PROBNP in the last 8760 hours.   D-Dimer No results for input(s): DDIMER in the last 72 hours. Hemoglobin A1C No results for input(s): HGBA1C in the last 72 hours. Fasting Lipid Panel No results for input(s): CHOL, HDL, LDLCALC, TRIG, CHOLHDL, LDLDIRECT in the last 72 hours. Thyroid Function Tests No results for input(s): TSH, T4TOTAL, T3FREE, THYROIDAB in the last 72 hours.  Invalid input(s): FREET3  Other results:   Imaging    No results  found.   Medications:     Scheduled Medications: . aspirin  81 mg Oral Daily  . azelastine  1 spray Each Nare Daily  . citalopram  20 mg Oral Daily  . digoxin  0.125 mg Oral Daily  . ezetimibe  5 mg Oral Once per day on Mon Thu  . famotidine  20 mg Oral Daily  . ferrous sulfate  325 mg Oral Q breakfast  . fluticasone  2 spray Each Nare Daily  . isosorbide mononitrate  30 mg Oral Daily  . loratadine  10 mg Oral Daily  . mometasone-formoterol  2 puff Inhalation BID  . nicotine  21 mg Transdermal Daily  . omega-3 acid ethyl esters  2 g Oral Daily  . pantoprazole  40 mg Oral Daily  . rosuvastatin  5 mg Oral QODAY  . sodium chloride flush  10-40 mL Intracatheter Q12H  . spironolactone  12.5 mg Oral Daily     Infusions: . sodium chloride 10 mL/hr at 06/22/18 0636  . furosemide (LASIX) infusion 15 mg/hr (06/21/18 1800)  . milrinone 0.25 mcg/kg/min (06/22/18 0504)    PRN Medications: acetaminophen, hydrALAZINE, morphine injection, nitroGLYCERIN, ondansetron (ZOFRAN) IV, polyethylene glycol, sodium chloride flush, zolpidem    Patient Profile   Patient has long history of CAD with prior anterior MI. Last cath in 8/15 with nonobstructive disease. She has ischemic cardiomyopathy with extensive scarring by 12/09 cardiac MRI.  Most recent echo was done this admission, showing EF 15% with moderate LV dilation and mildly decreased RV systolic function.  She additionally is an active smoker with COPD.  Baseline creatinine around 1.8, CKD stage 3.   Admitted with volume overload with poor response to lasix.   Assessment/Plan   1. Acute on chronic systolic CHF: Ischemic cardiomyopathy, echo in 7/19 with EF 15%, mildly decreased RV systolic function, moderate MR, moderate TR.  Symptoms have been worsening over a number of weeks, had recent admission at the end of June but probably was not fully diuresed prior to discharge.  Today, she is markedly volume overloaded on exam.  I suspect that her GI symptoms are due to RV failure with congestive hepatopathy/elevated LFTs.  - Volume status improving. CVP 11 - Continue milrinone 0.25 mcg/kg/min. Coox 55%. SBP 90-100s - Continue lasix drip 15 mg/hr.  - Continue spiro 12.5 mg daily.  - Continue digoxin 0.125 mg daily  - No beta blocker with low output.   - No ACEI/ARB/ARNI with AKI. May be able to start low dose ARB tomorrow if creatinine stable after cath.  - Overall picture, as above, is concerning for low output HF with RV failure and cardiorenal syndrome.  If we can stabilize her, LVAD may be needed in the future.  She would not be a transplant candidate with active smoking and age.  - Going for [facility] today. Creatinine 1.46 2. AKI on CKD stage 3: As  above, concerned for cardiorenal syndrome with low output HF.  Hopefully milrinone + diuresis with lowering renal venous pressure will help.  - Creatinine trending down with milrinone and diuresis, now 1.46 3. 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  Workup for gallbladder disease unremarkable.  Follow closely, hopefully will improve with diuresis.  - LFTs trending down yesterday.  4. COPD: Emphysema on CT chest.  Needs to quit smoking (discussed). No change.  5. CAD: As above, nonobstructive disease on last cath in 8/15. No s/s ischemia. - Continue ASA 81 daily.  - Continue Crestor 5 mg qod (cannot  tolerate higher dose).  6. PAD: Chronic claudication with left SFA occluded. No change.  7. Carotid stenosis: Moderate RICA stenosis on 9/18 dopplers. No change.  8. Hypokalemia - K 2.8. Supp. Recheck BMET this afternoon.    Length of Stay: [place], NP  06/22/2018, 7:11 AM  Advanced Heart Failure Team Pager [phone] (M-F; [place])  Please contact [facility] for night-coverage after hours (4p -7a ) and weekends on [email or web address]  Patient seen with NP, agree with the above note. She diuresed very well yesterday, weight down 5 lbs.  CVP 11 this morning with co-ox 55%. Still short of breath with ambulation.   Creatinine trending down, 1.46 this morning.   On exam, JVP 12+ cm, regular S1S2 with 2/6 HSM LLSB/apex. 1+ edema to knees.   Continue milrinone 0.25 today and Lasix 15 mg/hr.  Still appears to have some volume overload by exam.  Will hold off on metolazone until after we see [place] numbers. Needs aggressive K repletion today and repeat BMET in pm.  Continue spironolactone and digoxin.   Will plan RHC today along with coronary angiography to rule out worsening CAD as cause of worsening HF now that creatinine has improved. Discussed risks/benefits of procedure and she agrees.   She may be an LVAD candidate in the future with low output HF.   [name] 06/22/2018 8:12 AM

## 2018-06-22 NOTE — Progress Notes (Signed)
PROGRESS NOTE    [name]  QPY:[id number] DOB: 1951-03-09 DOA: 06/15/2018 PCP: [facility]    Brief Narrative: 67 year old female with a history of hypertension, hyperlipidemia, asthma, GERD, depression, CAD, MI, CHF with EF 20%, tobacco abuse, CKD stage III, ICD placement, iron deficiency anemia came to hospital with nausea vomiting abdominal pain for the past 3 days. Patient was seen by general surgery and also underwent HIDA scan which was negative for biliary disease. Patient still complains of nausea. She does have a history of GERD. LFTs are still elevated. Hepatitis panel has been obtained.    Assessment & Plan:   Principal Problem:   Abdominal pain Active Problems:   Essential hypertension   Tobacco abuse   Implantable cardioverter-defibrillator (ICD) in situ   Hyperlipidemia   Acute renal failure superimposed on stage 3 chronic kidney disease (HCC)   Asthma   CAD (coronary artery disease)   Acute on chronic systolic CHF (congestive heart failure) (HCC)   Hyponatremia   Hypokalemia   Rectal bleeding   Elevated troponin   Iron deficiency anemia   Abnormal LFTs   AKI (acute kidney injury) (HCC)   Intractable Nausea, vomiting and pain: Suspect from hepatic congestion from CHF.  Ct abdomen and pelvis is negative.  HIDA IS negative.  IV diuresis with IV lasix.    Acute systolic CHF:  Echocardiogram showed LVEF of 20%. BNP still elevated. Underwent LHC AND RHC, mild non obstructive CAD, and fluid overloaded.  Cardiology on board and managing the fluid overload.  Pt on milrinone gtt along with lasix.    Abnormal LFT'S  Improving.  Possibly from congestion.    Acute on Stage 3 CKD IMPROVING.    CAD No further work up at this time.    Rectal bleeding likely from hemorrhoids.   Hyponatremia:  Possibly from fluid overload.  Improving.    DVT prophylaxis:  Heparin sq Code Status:  Full code.  Family Communication:  none at bedside.  Disposition Plan: pending clnical improvement.   Consultants:   CARDIOLOGY.    Procedures:   Echocardiogram  LHC  RHC   Antimicrobials: NONE.    Subjective: No chest pain or sob. Nausea, improved.   Objective: Vitals:   06/22/18 1130 06/22/18 1145 06/22/18 1200 06/22/18 1215  BP:  107/67 (!) 109/53 117/63  Pulse: 80 86 81 80  Resp: 16 16 15 15   Temp:      TempSrc:      SpO2: 95% 96% 94% 94%  Weight:      Height:        Intake/Output Summary (Last 24 hours) at 06/22/2018 1607 Last data filed at 06/22/2018 1232 Gross per 24 hour  Intake 53.52 ml  Output 3775 ml  Net -3721.48 ml   Filed Weights   06/20/18 0800 06/21/18 0454 06/22/18 0500  Weight: 57 kg (125 lb 10.6 oz) 58.9 kg (129 lb 12.8 oz) 56.3 kg (124 lb 3.2 oz)    Examination:  General exam: Appears calm and comfortable  Respiratory system: Clear to auscultation. Respiratory effort normal. Cardiovascular system: S1 & S2 heard, RRR. No JVD, murmurs,. No pedal edema. Gastrointestinal system: Abdomen is nondistended, soft and nontender. No organomegaly or masses felt. Normal bowel sounds heard. Central nervous system: Alert and oriented. No focal neurological deficits. Extremities: Symmetric 5 x 5 power. Skin: No rashes, lesions or ulcers Psychiatry:  Mood & affect appropriate.     Data Reviewed: I have personally reviewed following labs and imaging  studies  CBC: Recent Labs  Lab 06/15/18 1616 06/16/18 1019 06/21/18 0500 06/22/18 0529  WBC 8.2 6.7 6.4 6.1  NEUTROABS  --   --  4.9 4.5  HGB 11.3* 11.6* 10.3* 10.3*  HCT 35.3* 36.9 33.8* 33.2*  MCV 91.0 92.7 91.8 92.7  PLT 143* 136* 107* 767*   Basic Metabolic Panel: Recent Labs  Lab 06/18/18 0601 06/19/18 0616 06/20/18 0453 06/21/18 0500 06/22/18 0500 06/22/18 0529  NA 131* 131* 133* 135  --  135  K 4.2 4.6 3.2* 3.9  --  2.8*  CL 93* 95* 95* 94*  --  87*  CO2 27 26 29 29   --  37*  GLUCOSE 114* 99 126* 130*  --   128*  BUN 49* 56* 46* 32*  --  23  CREATININE 2.35* 2.64* 2.14* 1.74*  --  1.46*  CALCIUM 9.3 9.1 8.4* 8.4*  --  9.0  MG  --   --   --   --  2.2  --    GFR: Estimated Creatinine Clearance: 30.8 mL/min (A) (by C-G formula based on SCr of 1.46 mg/dL (H)). Liver Function Tests: Recent Labs  Lab 06/17/18 0319 06/18/18 0601 06/19/18 0923 06/20/18 0453 06/21/18 0500  AST 286* 246* 310* 167* 114*  ALT 367* 365* 462* 305* 260*  ALKPHOS 78 95 95 81 90  BILITOT 1.3* 1.3* 1.7* 1.5* 1.2  PROT 5.8* 6.1* 6.3* 5.3* 5.7*  ALBUMIN 2.9* 3.0* 3.1* 2.7* 2.9*   Recent Labs  Lab 06/15/18 1616  LIPASE 57*   Recent Labs  Lab 06/16/18 0255  AMMONIA 24   Coagulation Profile: Recent Labs  Lab 06/16/18 0255  INR 1.39   Cardiac Enzymes: Recent Labs  Lab 06/15/18 1616 06/16/18 0255 06/16/18 1019 06/16/18 1437  TROPONINI 0.04* 0.04* 0.03* 0.03*   BNP (last 3 results) No results for input(s): PROBNP in the last 8760 hours. HbA1C: No results for input(s): HGBA1C in the last 72 hours. CBG: Recent Labs  Lab 06/16/18 1215  GLUCAP 111*   Lipid Profile: No results for input(s): CHOL, HDL, LDLCALC, TRIG, CHOLHDL, LDLDIRECT in the last 72 hours. Thyroid Function Tests: No results for input(s): TSH, T4TOTAL, FREET4, T3FREE, THYROIDAB in the last 72 hours. Anemia Panel: No results for input(s): VITAMINB12, FOLATE, FERRITIN, TIBC, IRON, RETICCTPCT in the last 72 hours. Sepsis Labs: No results for input(s): PROCALCITON, LATICACIDVEN in the last 168 hours.  No results found for this or any previous visit (from the past 240 hour(s)).       Radiology Studies: No results found.      Scheduled Meds: . aspirin  81 mg Oral Daily  . azelastine  1 spray Each Nare Daily  . citalopram  20 mg Oral Daily  . digoxin  0.125 mg Oral Daily  . ezetimibe  5 mg Oral Once per day on Mon Thu  . famotidine  20 mg Oral Daily  . ferrous sulfate  325 mg Oral Q breakfast  . fluticasone  2 spray Each  Nare Daily  . heparin  5,000 Units Subcutaneous Q8H  . isosorbide mononitrate  30 mg Oral Daily  . loratadine  10 mg Oral Daily  . mometasone-formoterol  2 puff Inhalation BID  . nicotine  21 mg Transdermal Daily  . omega-3 acid ethyl esters  2 g Oral Daily  . pantoprazole  40 mg Oral Daily  . potassium chloride  40 mEq Oral BID  . rosuvastatin  5 mg Oral QODAY  . sodium chloride  flush  10-40 mL Intracatheter Q12H  . sodium chloride flush  3 mL Intravenous Q12H  . spironolactone  12.5 mg Oral Daily   Continuous Infusions: . sodium chloride    . furosemide (LASIX) infusion 15 mg/hr (06/22/18 1024)  . milrinone 0.25 mcg/kg/min (06/22/18 0504)     LOS: 6 days    Time spent: 35 minutes.     Hosie Poisson, MD [facility] Pager [phone]  If 7PM-7AM, please contact night-coverage [email or web address] Password [email or web address] 06/22/2018, 4:07 PM

## 2018-06-22 NOTE — Progress Notes (Signed)
Site area: rt groin fa and fv sheaths Site Prior to Removal:  Level 0 Pressure Applied For: 20 minutes Manual:   yes Patient Status During Pull:  stable Post Pull Site:  Level 0 Post Pull Instructions Given:  yes Post Pull Pulses Present: rt dp palpable Dressing Applied:  Gauze and tegaderm Bedrest begins @ 1020 Comments:

## 2018-06-23 DIAGNOSIS — R945 Abnormal results of liver function studies: Secondary | ICD-10-CM

## 2018-06-23 DIAGNOSIS — I10 Essential (primary) hypertension: Secondary | ICD-10-CM

## 2018-06-23 DIAGNOSIS — R112 Nausea with vomiting, unspecified: Secondary | ICD-10-CM

## 2018-06-23 DIAGNOSIS — N170 Acute kidney failure with tubular necrosis: Secondary | ICD-10-CM

## 2018-06-23 DIAGNOSIS — E876 Hypokalemia: Secondary | ICD-10-CM

## 2018-06-23 DIAGNOSIS — K625 Hemorrhage of anus and rectum: Secondary | ICD-10-CM

## 2018-06-23 DIAGNOSIS — N183 Chronic kidney disease, stage 3 (moderate): Secondary | ICD-10-CM

## 2018-06-23 LAB — CBC WITH DIFFERENTIAL/PLATELET
Abs Immature Granulocytes: 0 10*3/uL (ref 0.0–0.1)
BASOS ABS: 0 10*3/uL (ref 0.0–0.1)
Basophils Relative: 0 %
EOS PCT: 1 %
Eosinophils Absolute: 0.1 10*3/uL (ref 0.0–0.7)
HCT: 35.8 % — ABNORMAL LOW (ref 36.0–46.0)
HEMOGLOBIN: 10.9 g/dL — AB (ref 12.0–15.0)
Immature Granulocytes: 0 %
LYMPHS PCT: 14 %
Lymphs Abs: 0.8 10*3/uL (ref 0.7–4.0)
MCH: 28.3 pg (ref 26.0–34.0)
MCHC: 30.4 g/dL (ref 30.0–36.0)
MCV: 93 fL (ref 78.0–100.0)
MONO ABS: 0.6 10*3/uL (ref 0.1–1.0)
Monocytes Relative: 11 %
Neutro Abs: 4 10*3/uL (ref 1.7–7.7)
Neutrophils Relative %: 74 %
Platelets: 118 10*3/uL — ABNORMAL LOW (ref 150–400)
RBC: 3.85 MIL/uL — ABNORMAL LOW (ref 3.87–5.11)
RDW: 17.8 % — ABNORMAL HIGH (ref 11.5–15.5)
WBC: 5.5 10*3/uL (ref 4.0–10.5)

## 2018-06-23 LAB — BASIC METABOLIC PANEL
Anion gap: 11 (ref 5–15)
BUN: 18 mg/dL (ref 8–23)
CO2: 38 mmol/L — ABNORMAL HIGH (ref 22–32)
Calcium: 9.3 mg/dL (ref 8.9–10.3)
Chloride: 87 mmol/L — ABNORMAL LOW (ref 98–111)
Creatinine, Ser: 1.51 mg/dL — ABNORMAL HIGH (ref 0.44–1.00)
GFR calc Af Amer: 40 mL/min — ABNORMAL LOW (ref >60)
GFR, EST NON AFRICAN AMERICAN: 34 mL/min — AB (ref >60)
Glucose, Bld: 126 mg/dL — ABNORMAL HIGH (ref 70–99)
POTASSIUM: 3.4 mmol/L — AB (ref 3.5–5.1)
Sodium: 136 mmol/L (ref 135–145)

## 2018-06-23 LAB — COOXEMETRY PANEL
CARBOXYHEMOGLOBIN: 1.7 % — AB (ref 0.5–1.5)
METHEMOGLOBIN: 1.5 % (ref 0.0–1.5)
O2 Saturation: 69 %
TOTAL HEMOGLOBIN: 10.9 g/dL — AB (ref 12.0–16.0)

## 2018-06-23 LAB — HEPATIC FUNCTION PANEL
ALT: 171 U/L — ABNORMAL HIGH (ref 0–44)
AST: 61 U/L — ABNORMAL HIGH (ref 15–41)
Albumin: 3.2 g/dL — ABNORMAL LOW (ref 3.5–5.0)
Alkaline Phosphatase: 93 U/L (ref 38–126)
BILIRUBIN DIRECT: 0.4 mg/dL — AB (ref 0.0–0.2)
BILIRUBIN INDIRECT: 1 mg/dL — AB (ref 0.3–0.9)
BILIRUBIN TOTAL: 1.4 mg/dL — AB (ref 0.3–1.2)
Total Protein: 6.3 g/dL — ABNORMAL LOW (ref 6.5–8.1)

## 2018-06-23 LAB — MAGNESIUM: MAGNESIUM: 1.8 mg/dL (ref 1.7–2.4)

## 2018-06-23 MED ORDER — POTASSIUM CHLORIDE CRYS ER 20 MEQ PO TBCR
20.0000 meq | EXTENDED_RELEASE_TABLET | Freq: Once | ORAL | Status: AC
  Administered 2018-06-23: 20 meq via ORAL
  Filled 2018-06-23: qty 1

## 2018-06-23 MED ORDER — SPIRONOLACTONE 25 MG PO TABS
25.0000 mg | ORAL_TABLET | Freq: Every day | ORAL | Status: DC
Start: 2018-06-23 — End: 2018-06-28
  Administered 2018-06-23 – 2018-06-28 (×6): 25 mg via ORAL
  Filled 2018-06-23 (×6): qty 1

## 2018-06-23 MED ORDER — METOLAZONE 2.5 MG PO TABS
2.5000 mg | ORAL_TABLET | Freq: Once | ORAL | Status: AC
  Administered 2018-06-23: 2.5 mg via ORAL
  Filled 2018-06-23: qty 1

## 2018-06-23 NOTE — Progress Notes (Signed)
PROGRESS NOTE    [name]  KCL:[id number] DOB: [birth year] DOA: 06/15/2018 PCP: [facility]    Brief Narrative: 67 year old female with a history of hypertension, hyperlipidemia, asthma, GERD, depression, CAD, MI, CHF with EF 20%, tobacco abuse, CKD stage III, ICD placement, iron deficiency anemia came to hospital with nausea vomiting abdominal pain for the past 3 days. Patient was seen by general surgery and also underwent HIDA scan which was negative for biliary disease. Patient still complains of nausea. She does have a history of GERD. LFTs are still elevated. Hepatitis panel has been obtained.    Assessment & Plan:   Principal Problem:   Abdominal pain Active Problems:   Essential hypertension   Tobacco abuse   Implantable cardioverter-defibrillator (ICD) in situ   Hyperlipidemia   Acute renal failure superimposed on stage 3 chronic kidney disease (HCC)   Asthma   CAD (coronary artery disease)   Acute on chronic systolic CHF (congestive heart failure) (HCC)   Hyponatremia   Hypokalemia   Rectal bleeding   Elevated troponin   Iron deficiency anemia   Abnormal LFTs   AKI (acute kidney injury) (HCC)   Intractable Nausea, vomiting and abdominal  pain: Suspect from hepatic congestion from CHF.  Ct abdomen and pelvis is negative.  HIDA IS negative.  IV diuresis with IV lasix.  Symptoms have improved.    Acute systolic CHF:  Echocardiogram showed LVEF of 20%. BNP still elevated. Underwent LHC AND RHC, mild non obstructive CAD, and fluid overloaded.  Cardiology on board and managing the fluid overload.  Pt on milrinone gtt along with lasix. Diuresed about 1800 ml overnight.     Abnormal LFT'S  Improving.  Possibly from congestion. Repeat liver function test today.    Acute on Stage 3 CKD Creatinine at 1.5 today.    CAD No further work up at this time.    Rectal bleeding likely from hemorrhoids. Hemoglobin stable around  10.    Anemia of chronic disease;  Hemoglobin stable around 10.   Hyponatremia:  Possibly from fluid overload.  Resolved.    DVT prophylaxis:  Heparin sq Code Status:  Full code.  Family Communication: none at bedside.  Disposition Plan: pending clnical improvement and PT evaluation.   Consultants:   CARDIOLOGY.    Procedures:   Echocardiogram  LHC  RHC   Antimicrobials: NONE.    Subjective: Pt reports slight headache.  No chest pain or sob.  Nausea, vomiting has improved.  Wants the IV drips to be stopped and go home.    Objective: Vitals:   06/23/18 0500 06/23/18 0600 06/23/18 0743 06/23/18 0753  BP:    110/68  Pulse:    87  Resp: 20 16  17   Temp:    97.8 F (36.6 C)  TempSrc:    Oral  SpO2:   97%   Weight: 54.2 kg (119 lb 7.8 oz)     Height:        Intake/Output Summary (Last 24 hours) at 06/23/2018 0806 Last data filed at 06/23/2018 0707 Gross per 24 hour  Intake 545.47 ml  Output 1800 ml  Net -1254.53 ml   Filed Weights   06/21/18 0454 06/22/18 0500 06/23/18 0500  Weight: 58.9 kg (129 lb 12.8 oz) 56.3 kg (124 lb 3.2 oz) 54.2 kg (119 lb 7.8 oz)    Examination:  General exam: Appears calm and comfortable on RA not in distress.  Respiratory system: Clear to auscultation. Respiratory effort normal. No  wheezing or rhonchi.  Cardiovascular system: S1 & S2 heard, RRR. No JVD, murmurs,. No pedal edema. Gastrointestinal system: Abdomen is soft NT ND BS+ Central nervous system: Alert and oriented. No focal neurological deficits. ExtremitieS: no cyanosis or clubbing.  Skin: No rashes, lesions or ulcers Psychiatry:  Mood & affect appropriate.     Data Reviewed: I have personally reviewed following labs and imaging studies  CBC: Recent Labs  Lab 06/16/18 1019 06/21/18 0500 06/22/18 0529 06/23/18 0541  WBC 6.7 6.4 6.1 5.5  NEUTROABS  --  4.9 4.5 4.0  HGB 11.6* 10.3* 10.3* 10.9*  HCT 36.9 33.8* 33.2* 35.8*  MCV 92.7 91.8 92.7 93.0  PLT  136* 107* 103* 409*   Basic Metabolic Panel: Recent Labs  Lab 06/20/18 0453 06/21/18 0500 06/22/18 0500 06/22/18 0529 06/22/18 1845 06/23/18 0541  NA 133* 135  --  135 134* 136  K 3.2* 3.9  --  2.8* 3.5 3.4*  CL 95* 94*  --  87* 88* 87*  CO2 29 29  --  37* 35* 38*  GLUCOSE 126* 130*  --  128* 136* 126*  BUN 46* 32*  --  23 22 18   CREATININE 2.14* 1.74*  --  1.46* 1.47* 1.51*  CALCIUM 8.4* 8.4*  --  9.0 9.2 9.3  MG  --   --  2.2  --   --   --    GFR: Estimated Creatinine Clearance: 29.8 mL/min (A) (by C-G formula based on SCr of 1.51 mg/dL (H)). Liver Function Tests: Recent Labs  Lab 06/17/18 0319 06/18/18 0601 06/19/18 0923 06/20/18 0453 06/21/18 0500  AST 286* 246* 310* 167* 114*  ALT 367* 365* 462* 305* 260*  ALKPHOS 78 95 95 81 90  BILITOT 1.3* 1.3* 1.7* 1.5* 1.2  PROT 5.8* 6.1* 6.3* 5.3* 5.7*  ALBUMIN 2.9* 3.0* 3.1* 2.7* 2.9*   No results for input(s): LIPASE, AMYLASE in the last 168 hours. No results for input(s): AMMONIA in the last 168 hours. Coagulation Profile: No results for input(s): INR, PROTIME in the last 168 hours. Cardiac Enzymes: Recent Labs  Lab 06/16/18 1019 06/16/18 1437  TROPONINI 0.03* 0.03*   BNP (last 3 results) No results for input(s): PROBNP in the last 8760 hours. HbA1C: No results for input(s): HGBA1C in the last 72 hours. CBG: Recent Labs  Lab 06/16/18 1215  GLUCAP 111*   Lipid Profile: No results for input(s): CHOL, HDL, LDLCALC, TRIG, CHOLHDL, LDLDIRECT in the last 72 hours. Thyroid Function Tests: No results for input(s): TSH, T4TOTAL, FREET4, T3FREE, THYROIDAB in the last 72 hours. Anemia Panel: No results for input(s): VITAMINB12, FOLATE, FERRITIN, TIBC, IRON, RETICCTPCT in the last 72 hours. Sepsis Labs: No results for input(s): PROCALCITON, LATICACIDVEN in the last 168 hours.  No results found for this or any previous visit (from the past 240 hour(s)).       Radiology Studies: No results  found.      Scheduled Meds: . aspirin  81 mg Oral Daily  . azelastine  1 spray Each Nare Daily  . citalopram  20 mg Oral Daily  . digoxin  0.125 mg Oral Daily  . ezetimibe  5 mg Oral Once per day on Mon Thu  . famotidine  20 mg Oral Daily  . ferrous sulfate  325 mg Oral Q breakfast  . fluticasone  2 spray Each Nare Daily  . heparin  5,000 Units Subcutaneous Q8H  . isosorbide mononitrate  30 mg Oral Daily  . loratadine  10 mg  Oral Daily  . mometasone-formoterol  2 puff Inhalation BID  . nicotine  21 mg Transdermal Daily  . omega-3 acid ethyl esters  2 g Oral Daily  . pantoprazole  40 mg Oral Daily  . potassium chloride  40 mEq Oral BID  . rosuvastatin  5 mg Oral QODAY  . sodium chloride flush  10-40 mL Intracatheter Q12H  . sodium chloride flush  3 mL Intravenous Q12H  . spironolactone  12.5 mg Oral Daily   Continuous Infusions: . sodium chloride    . furosemide (LASIX) infusion 15 mg/hr (06/22/18 1024)  . milrinone 0.2508 mcg/kg/min (06/23/18 0707)     LOS: 7 days    Time spent: 35 minutes.     Hosie Poisson, MD [facility] Pager [phone]  If 7PM-7AM, please contact night-coverage [email or web address] Password [email or web address] 06/23/2018, 8:06 AM

## 2018-06-23 NOTE — Progress Notes (Signed)
PT Cancellation Note/ discharge  Patient Details Name: [name] MRN: [id number] DOB: May 04, 1951   Cancelled Treatment:    Reason Eval/Treat Not Completed: PT screened, no needs identified, will sign off(pt seen walking in hall earlier today. Pt and RN report no deficits with mobility or gait, no history of falls. Pt currently at baseline and declined need for therapy )   Laterra Lubinski B Lui Bellis 06/23/2018, 1:07 PM  Elwyn Reach, [place]

## 2018-06-23 NOTE — Progress Notes (Addendum)
Advanced Heart Failure Rounding Note  PCP-Cardiologist: Jenkins Rouge, MD   Subjective:    Remains on milrinone 0.25 mcg/kg/min and lasix drip at 15 mg/hr. Coox 69%.   Less UOP yesterday (-1.3 L), but weight down additional 5 lbs. Creatinine stable 1.51, K 3.4. CVP 17.  5 beat and 6 beat runs of NSVT overnight.  Cath yesterday showed nonobstructive CAD, preserved CO (on milrinone), and right and left elevated filling pressures.   Feels okay this morning. Not sleeping well. Denies CP. SOB is improving. No SOB with getting up to the bathroom.   LHC 06/22/18: Left Main  Calcified and relatively short left main with about 40% stenosis.  Left Anterior Descending  40% mid LAD stenosis.  Left Circumflex  50% stenosis proximal LCx just prior to stent. Patient stent proximal LCx.  Right Coronary Artery  40% mid RCA stenosis.   RHC 06/22/18: RA mean 13 RV 48/13 PA 49/16, mean 27 PCWP mean 19 LV 110/27 AO 108/57  Oxygen saturations: PA 68% AO 98%  Cardiac Output (Fick) 5.04  Cardiac Index (Fick) 3.16 PVR 1.59 WU  Cardiac Output (Thermo) 3.68 Cardiac Index (Thermo) 2.31  Objective:   Weight Range: 119 lb 7.8 oz (54.2 kg) Body mass index is 20.83 kg/m.   Vital Signs:   Temp:  [97.4 F (36.3 C)-98.2 F (36.8 C)] 97.8 F (36.6 C) (07/18 0300) Pulse Rate:  [71-87] 82 (07/18 0300) Resp:  [11-19] 17 (07/18 0300) BP: (93-125)/(40-69) 107/60 (07/18 0300) SpO2:  [92 %-100 %] 98 % (07/17 2346) Weight:  [119 lb 7.8 oz (54.2 kg)] 119 lb 7.8 oz (54.2 kg) (07/18 0500) Last BM Date: 06/20/18  Weight change: Filed Weights   06/21/18 0454 06/22/18 0500 06/23/18 0500  Weight: 129 lb 12.8 oz (58.9 kg) 124 lb 3.2 oz (56.3 kg) 119 lb 7.8 oz (54.2 kg)    Intake/Output:   Intake/Output Summary (Last 24 hours) at 06/23/2018 0745 Last data filed at 06/23/2018 0707 Gross per 24 hour  Intake 545.47 ml  Output 1800 ml  Net -1254.53 ml      Physical Exam   CVP 17 General:  No resp difficulty. HEENT: Normal Neck: Supple. JVP 17. Carotids 2+ bilat; no bruits. No thyromegaly or nodule noted. Cor: PMI laterally displaced. RRR, 2/6 HSM at apex Lungs: CTAB, normal effort. Abdomen: Soft, non-tender, non-distended, no HSM. No bruits or masses. +BS  Extremities: No cyanosis, clubbing, or rash. R and LLE 1+ edema  Neuro: Alert & orientedx3, cranial nerves grossly intact. moves all 4 extremities w/o difficulty. Affect pleasant  Telemetry   NSR 80-90s. 5 and 6 beats NSVT. Personally reviewed.   EKG    No new tracings.   Labs    CBC Recent Labs    06/22/18 0529 06/23/18 0541  WBC 6.1 5.5  NEUTROABS 4.5 4.0  HGB 10.3* 10.9*  HCT 33.2* 35.8*  MCV 92.7 93.0  PLT 103* 103*   Basic Metabolic Panel Recent Labs    06/22/18 0500  06/22/18 1845 06/23/18 0541  NA  --    < > 134* 136  K  --    < > 3.5 3.4*  CL  --    < > 88* 87*  CO2  --    < > 35* 38*  GLUCOSE  --    < > 136* 126*  BUN  --    < > 22 18  CREATININE  --    < > 1.47* 1.51*  CALCIUM  --    < >  9.2 9.3  MG 2.2  --   --   --    < > = values in this interval not displayed.   Liver Function Tests Recent Labs    06/21/18 0500  AST 114*  ALT 260*  ALKPHOS 90  BILITOT 1.2  PROT 5.7*  ALBUMIN 2.9*   No results for input(s): LIPASE, AMYLASE in the last 72 hours. Cardiac Enzymes No results for input(s): CKTOTAL, CKMB, CKMBINDEX, TROPONINI in the last 72 hours.  BNP: BNP (last 3 results) Recent Labs    06/04/18 0132 06/15/18 1617 06/18/18 0601  BNP 3,135.7* 3,299.2* 2,926.1*    ProBNP (last 3 results) No results for input(s): PROBNP in the last 8760 hours.   D-Dimer No results for input(s): DDIMER in the last 72 hours. Hemoglobin A1C No results for input(s): HGBA1C in the last 72 hours. Fasting Lipid Panel No results for input(s): CHOL, HDL, LDLCALC, TRIG, CHOLHDL, LDLDIRECT in the last 72 hours. Thyroid Function Tests No results for input(s): TSH, T4TOTAL, T3FREE,  THYROIDAB in the last 72 hours.  Invalid input(s): FREET3  Other results:   Imaging    No results found.   Medications:     Scheduled Medications: . aspirin  81 mg Oral Daily  . azelastine  1 spray Each Nare Daily  . citalopram  20 mg Oral Daily  . digoxin  0.125 mg Oral Daily  . ezetimibe  5 mg Oral Once per day on Mon Thu  . famotidine  20 mg Oral Daily  . ferrous sulfate  325 mg Oral Q breakfast  . fluticasone  2 spray Each Nare Daily  . heparin  5,000 Units Subcutaneous Q8H  . isosorbide mononitrate  30 mg Oral Daily  . loratadine  10 mg Oral Daily  . mometasone-formoterol  2 puff Inhalation BID  . nicotine  21 mg Transdermal Daily  . omega-3 acid ethyl esters  2 g Oral Daily  . pantoprazole  40 mg Oral Daily  . potassium chloride  40 mEq Oral BID  . rosuvastatin  5 mg Oral QODAY  . sodium chloride flush  10-40 mL Intracatheter Q12H  . sodium chloride flush  3 mL Intravenous Q12H  . spironolactone  12.5 mg Oral Daily    Infusions: . sodium chloride    . furosemide (LASIX) infusion 15 mg/hr (06/22/18 1024)  . milrinone 0.2508 mcg/kg/min (06/23/18 0707)    PRN Medications: sodium chloride, acetaminophen, hydrALAZINE, morphine injection, nitroGLYCERIN, ondansetron (ZOFRAN) IV, polyethylene glycol, sodium chloride flush, sodium chloride flush, zolpidem    Patient Profile   Patient has long history of CAD with prior anterior MI. Last cath in 8/15 with nonobstructive disease. She has ischemic cardiomyopathy with extensive scarring by 12/09 cardiac MRI.  Most recent echo was done this admission, showing EF 15% with moderate LV dilation and mildly decreased RV systolic function.  She additionally is an active smoker with COPD.  Baseline creatinine around 1.8, CKD stage 3.   Admitted with volume overload with poor response to lasix.   Assessment/Plan   1. Acute on chronic systolic CHF: Ischemic cardiomyopathy, echo in 7/19 with EF 15%, mildly decreased RV systolic  function, moderate MR, moderate TR.  Symptoms have been worsening over a number of weeks, had recent admission at the end of June but probably was not fully diuresed prior to discharge.  Today, she is markedly volume overloaded on exam.  I suspect that her GI symptoms are due to RV failure with congestive hepatopathy/elevated LFTs.  -  Volume status elevated. CVP 17 - Continue milrinone 0.25 mcg/kg/min. Coox 69%. - Continue lasix drip @ 15 mg/hr. Add metolazone 2.5 mg x1 today.  - Increase spiro to 25 mg daily. SBP 90-100s - Continue digoxin 0.125 mg daily  - No beta blocker with low output.   - No ACEI/ARB/ARNI with AKI. May be able to start low dose ARB tomorrow if creatinine stable after cath.  - Overall picture, as above, is concerning for low output HF with RV failure and cardiorenal syndrome.  If we can stabilize her, LVAD may be needed in the future.  She would not be a transplant candidate with active smoking and age.  - RHC 06/22/18 showed elevated filling pressures on right and left with preserved CO (on milrinone) 2. AKI on CKD stage 3: As above, concerned for cardiorenal syndrome with low output HF.  Hopefully milrinone + diuresis with lowering renal venous pressure will help.  - Creatinine trending down with milrinone and diuresis, now 1.51 3. 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  Workup for gallbladder disease unremarkable.  Follow closely, hopefully will improve with diuresis.  - LFTs have been trending down. Check again tomorrow.  4. COPD: Emphysema on CT chest.  Needs to quit smoking (discussed). No change.  5. CAD: As above, nonobstructive disease on last cath in 8/15. LHC 06/22/18 with nonobstructive CAD. No s/s ischemia.  - Continue ASA 81 daily.  - Continue Crestor 5 mg qod (cannot tolerate higher dose).  6. PAD: Chronic claudication with left SFA occluded. No change.  7. Carotid stenosis: Moderate RICA stenosis on 9/18 dopplers. No change.  8. Hypokalemia - K  3.4. Supp and increase spiro as above.  9. NSVT - Supp K. Check mag.   Length of Stay: [place], NP  06/23/2018, 7:45 AM  Advanced Heart Failure Team Pager [phone] (M-F; [place])  Please contact [facility] for night-coverage after hours (4p -7a ) and weekends on [email or web address]  Patient seen with NP, agree with the above note.Cath yesterday with nonobstructive CAD and filling pressures still elevated but good CO on milrinone.  This morning, CVP 17 with co-ox 69%.   Creatinine stable at 1.5.   On exam, JVP12+cm, regular S1S2 with 2/6 HSM LLSB/apex. 1+ ankle edema.   Continue milrinone 0.25 today andLasix 15 mg/hr.  Will give metolazone 2.5 x 1. Continue spironolactone (increase to 25 mg daily) and digoxin. Replace K.   She may be an LVAD candidate in the future with low output HF.  Hopefully will be able to wean off milrinone when she is fully diuresed.   Loralie Champagne 06/23/2018 8:50 AM

## 2018-06-23 NOTE — Progress Notes (Signed)
CARDIAC REHAB PHASE I   PRE:  Rate/Rhythm: up in hall independently  MODE:  Ambulation: 200 ft   POST:  Rate/Rhythm: 98 SR  BP:  Supine: 112/55  Sitting:   Standing:    SaO2: 97%RA 1335-1353 Pt up in hall independently with steady gait pushing IV pole. Pt stated this was her third walk. Tolerated well. Back to bed after walk.   Graylon Good, RN BSN  06/23/2018 1:52 PM

## 2018-06-24 LAB — COOXEMETRY PANEL
Carboxyhemoglobin: 1.8 % — ABNORMAL HIGH (ref 0.5–1.5)
Methemoglobin: 1.6 % — ABNORMAL HIGH (ref 0.0–1.5)
O2 Saturation: 64.1 %
Total hemoglobin: 10.7 g/dL — ABNORMAL LOW (ref 12.0–16.0)

## 2018-06-24 LAB — CBC WITH DIFFERENTIAL/PLATELET
Abs Immature Granulocytes: 0 10*3/uL (ref 0.0–0.1)
Basophils Absolute: 0 10*3/uL (ref 0.0–0.1)
Basophils Relative: 0 %
EOS ABS: 0.1 10*3/uL (ref 0.0–0.7)
Eosinophils Relative: 1 %
HEMATOCRIT: 36.4 % (ref 36.0–46.0)
Hemoglobin: 11.4 g/dL — ABNORMAL LOW (ref 12.0–15.0)
IMMATURE GRANULOCYTES: 1 %
LYMPHS ABS: 1.1 10*3/uL (ref 0.7–4.0)
Lymphocytes Relative: 17 %
MCH: 28.7 pg (ref 26.0–34.0)
MCHC: 31.3 g/dL (ref 30.0–36.0)
MCV: 91.7 fL (ref 78.0–100.0)
MONO ABS: 0.8 10*3/uL (ref 0.1–1.0)
MONOS PCT: 13 %
NEUTROS PCT: 68 %
Neutro Abs: 4.2 10*3/uL (ref 1.7–7.7)
Platelets: 123 10*3/uL — ABNORMAL LOW (ref 150–400)
RBC: 3.97 MIL/uL (ref 3.87–5.11)
RDW: 17.4 % — AB (ref 11.5–15.5)
WBC: 6.1 10*3/uL (ref 4.0–10.5)

## 2018-06-24 LAB — COMPREHENSIVE METABOLIC PANEL
ALBUMIN: 3.2 g/dL — AB (ref 3.5–5.0)
ALT: 129 U/L — ABNORMAL HIGH (ref 0–44)
AST: 42 U/L — AB (ref 15–41)
Alkaline Phosphatase: 93 U/L (ref 38–126)
Anion gap: 13 (ref 5–15)
BUN: 18 mg/dL (ref 8–23)
CHLORIDE: 84 mmol/L — AB (ref 98–111)
CO2: 36 mmol/L — AB (ref 22–32)
CREATININE: 1.5 mg/dL — AB (ref 0.44–1.00)
Calcium: 9.2 mg/dL (ref 8.9–10.3)
GFR calc non Af Amer: 34 mL/min — ABNORMAL LOW (ref >60)
GFR, EST AFRICAN AMERICAN: 40 mL/min — AB (ref >60)
GLUCOSE: 111 mg/dL — AB (ref 70–99)
Potassium: 3.5 mmol/L (ref 3.5–5.1)
Sodium: 133 mmol/L — ABNORMAL LOW (ref 135–145)
Total Bilirubin: 1.6 mg/dL — ABNORMAL HIGH (ref 0.3–1.2)
Total Protein: 6.1 g/dL — ABNORMAL LOW (ref 6.5–8.1)

## 2018-06-24 MED ORDER — LORAZEPAM 0.5 MG PO TABS
0.5000 mg | ORAL_TABLET | Freq: Two times a day (BID) | ORAL | Status: DC | PRN
Start: 2018-06-24 — End: 2018-06-28
  Administered 2018-06-24 – 2018-06-27 (×3): 0.5 mg via ORAL
  Filled 2018-06-24 (×3): qty 1

## 2018-06-24 MED ORDER — POTASSIUM CHLORIDE CRYS ER 20 MEQ PO TBCR
40.0000 meq | EXTENDED_RELEASE_TABLET | Freq: Once | ORAL | Status: AC
  Administered 2018-06-24: 40 meq via ORAL
  Filled 2018-06-24: qty 2

## 2018-06-24 MED ORDER — MAGNESIUM SULFATE 2 GM/50ML IV SOLN
2.0000 g | Freq: Once | INTRAVENOUS | Status: AC
  Administered 2018-06-24: 2 g via INTRAVENOUS
  Filled 2018-06-24: qty 50

## 2018-06-24 MED ORDER — LOSARTAN POTASSIUM 25 MG PO TABS
12.5000 mg | ORAL_TABLET | Freq: Every day | ORAL | Status: DC
  Administered 2018-06-24 – 2018-06-25 (×2): 12.5 mg via ORAL
  Filled 2018-06-24 (×2): qty 1

## 2018-06-24 NOTE — Progress Notes (Signed)
PROGRESS NOTE    [name]  WIO:[id number] DOB: 1950/12/25 DOA: 06/15/2018 PCP: [facility]    Brief Narrative: 67 year old female with a history of hypertension, hyperlipidemia, asthma, GERD, depression, CAD, MI, CHF with EF 20%, tobacco abuse, CKD stage III, ICD placement, iron deficiency anemia came to hospital with nausea vomiting abdominal pain for the past 3 days. Patient was seen by general surgery and also underwent HIDA scan which was negative for biliary disease. Her nausea, vomiting and abdominal pain have improved. Her liver function tests are improving. She is walking in the hallway.    Assessment & Plan:   Principal Problem:   Abdominal pain Active Problems:   Essential hypertension   Tobacco abuse   Implantable cardioverter-defibrillator (ICD) in situ   Hyperlipidemia   Acute renal failure superimposed on stage 3 chronic kidney disease (HCC)   Asthma   CAD (coronary artery disease)   Acute on chronic systolic CHF (congestive heart failure) (HCC)   Hyponatremia   Hypokalemia   Rectal bleeding   Elevated troponin   Iron deficiency anemia   Abnormal LFTs   AKI (acute kidney injury) (HCC)   Intractable Nausea, vomiting and abdominal  pain: Suspect from hepatic congestion from CHF, CT  abdomen and pelvis is negative.  HIDA IS negative. Surgery consulted and recommendations given.  IV diuresis with IV lasix with improvement of the liver function tests. Symptoms have improved.    Acute systolic CHF:  Echocardiogram showed LVEF of 20%. Underwent LHC AND RHC, mild non obstructive CAD, and fluid overloaded.  Cardiology on board and managing the fluid overload.  Pt on milrinone gtt along with lasix. Prn metolazone.  Spironolactone added on 7/18 25 mg daily.  Resume digoxin 0.125 mg daily.  No Ace inhibitor due to renal insufficiency.  No urine output recorded yesterday.    Abnormal LFT'S  Improving.  Possibly from congestion.    No nausea or vomiting.    Acute on Stage 3 CKD  baseline creatinine at 1.2 , currently the Creatinine at 1.5 today and stable.    CAD No further work up at this time.    Rectal bleeding likely from hemorrhoids. Hemoglobin stable around 10.    Anemia of chronic disease/ Iron deficiency anemia:  Iron supplementation.  Hemoglobin stable around 10.   Hyponatremia:  Possibly from fluid overload.  Resolved.    COPD:  No wheezing heard. Currently on Dulera.    Peripheral Arterial Disease:  Stable. On aspirin   Mild Thrombocytopenia:  Unclear etiology.  No signs of bleeding.    Hyperlipidemia:  Resume zetia and crestor.   Tobacco Use:  On Nicotine patch.    GERD Stable on protonix.   Hypertension  Well controlled.  Resume Imdur.    DVT prophylaxis:  Heparin sq Code Status:  Full code.  Family Communication: none at bedside.  Disposition Plan: pending clnical improvement and PT evaluation.   Consultants:   CARDIOLOGY.    Procedures:   Echocardiogram  LHC  RHC   Antimicrobials: NONE.    Subjective: No chest pain or sob,  vomiting or abdominal pain. Slightly nauseated.  No headache, dizziness. Was walking in the hallway without any issues.    Objective: Vitals:   06/23/18 2323 06/24/18 0307 06/24/18 0609 06/24/18 0725  BP: (!) 103/52 (!) 102/59  (!) 113/57  Pulse: 87 85  80  Resp: 17 14  16   Temp: 97.9 F (36.6 C) 98.1 F (36.7 C)  97.8 F (36.6  C)  TempSrc: Oral Oral  Oral  SpO2: 97% 98%    Weight:   51.8 kg (114 lb 3.2 oz)   Height:        Intake/Output Summary (Last 24 hours) at 06/24/2018 0750 Last data filed at 06/24/2018 0700 Gross per 24 hour  Intake 1232.46 ml  Output -  Net 1232.46 ml   Filed Weights   06/22/18 0500 06/23/18 0500 06/24/18 0609  Weight: 56.3 kg (124 lb 3.2 oz) 54.2 kg (119 lb 7.8 oz) 51.8 kg (114 lb 3.2 oz)    Examination:  General exam: calm and comfortable without any distress .  Respiratory  system: good air entry bilateral, no wheezing or rhonchi.  Cardiovascular system: s1s2, RRR,  Gastrointestinal system: Abdomen is soft non tender non distended bowel sounds good.  Central nervous system: Alert and oriented. No focal neurological deficits. ExtremitieS: no cyanosis or clubbing. No pedal edema.  Skin: No rashes, lesions or ulcers Psychiatry:  Mood & affect appropriate.     Data Reviewed: I have personally reviewed following labs and imaging studies  CBC: Recent Labs  Lab 06/21/18 0500 06/22/18 0529 06/23/18 0541 06/24/18 0555  WBC 6.4 6.1 5.5 6.1  NEUTROABS 4.9 4.5 4.0 4.2  HGB 10.3* 10.3* 10.9* 11.4*  HCT 33.8* 33.2* 35.8* 36.4  MCV 91.8 92.7 93.0 91.7  PLT 107* 103* 118* 865*   Basic Metabolic Panel: Recent Labs  Lab 06/21/18 0500 06/22/18 0500 06/22/18 0529 06/22/18 1845 06/23/18 0541 06/24/18 0555  NA 135  --  135 134* 136 133*  K 3.9  --  2.8* 3.5 3.4* 3.5  CL 94*  --  87* 88* 87* 84*  CO2 29  --  37* 35* 38* 36*  GLUCOSE 130*  --  128* 136* 126* 111*  BUN 32*  --  23 22 18 18   CREATININE 1.74*  --  1.46* 1.47* 1.51* 1.50*  CALCIUM 8.4*  --  9.0 9.2 9.3 9.2  MG  --  2.2  --   --  1.8  --    GFR: Estimated Creatinine Clearance: 28.9 mL/min (A) (by C-G formula based on SCr of 1.5 mg/dL (H)). Liver Function Tests: Recent Labs  Lab 06/19/18 0923 06/20/18 0453 06/21/18 0500 06/23/18 0541 06/24/18 0555  AST 310* 167* 114* 61* 42*  ALT 462* 305* 260* 171* 129*  ALKPHOS 95 81 90 93 93  BILITOT 1.7* 1.5* 1.2 1.4* 1.6*  PROT 6.3* 5.3* 5.7* 6.3* 6.1*  ALBUMIN 3.1* 2.7* 2.9* 3.2* 3.2*   No results for input(s): LIPASE, AMYLASE in the last 168 hours. No results for input(s): AMMONIA in the last 168 hours. Coagulation Profile: No results for input(s): INR, PROTIME in the last 168 hours. Cardiac Enzymes: No results for input(s): CKTOTAL, CKMB, CKMBINDEX, TROPONINI in the last 168 hours. BNP (last 3 results) No results for input(s): PROBNP in  the last 8760 hours. HbA1C: No results for input(s): HGBA1C in the last 72 hours. CBG: No results for input(s): GLUCAP in the last 168 hours. Lipid Profile: No results for input(s): CHOL, HDL, LDLCALC, TRIG, CHOLHDL, LDLDIRECT in the last 72 hours. Thyroid Function Tests: No results for input(s): TSH, T4TOTAL, FREET4, T3FREE, THYROIDAB in the last 72 hours. Anemia Panel: No results for input(s): VITAMINB12, FOLATE, FERRITIN, TIBC, IRON, RETICCTPCT in the last 72 hours. Sepsis Labs: No results for input(s): PROCALCITON, LATICACIDVEN in the last 168 hours.  No results found for this or any previous visit (from the past 240 hour(s)).  Radiology Studies: No results found.      Scheduled Meds: . aspirin  81 mg Oral Daily  . azelastine  1 spray Each Nare Daily  . citalopram  20 mg Oral Daily  . digoxin  0.125 mg Oral Daily  . ezetimibe  5 mg Oral Once per day on Mon Thu  . famotidine  20 mg Oral Daily  . ferrous sulfate  325 mg Oral Q breakfast  . fluticasone  2 spray Each Nare Daily  . heparin  5,000 Units Subcutaneous Q8H  . isosorbide mononitrate  30 mg Oral Daily  . loratadine  10 mg Oral Daily  . mometasone-formoterol  2 puff Inhalation BID  . nicotine  21 mg Transdermal Daily  . omega-3 acid ethyl esters  2 g Oral Daily  . pantoprazole  40 mg Oral Daily  . potassium chloride  40 mEq Oral BID  . rosuvastatin  5 mg Oral QODAY  . sodium chloride flush  10-40 mL Intracatheter Q12H  . sodium chloride flush  3 mL Intravenous Q12H  . spironolactone  25 mg Oral Daily   Continuous Infusions: . sodium chloride    . furosemide (LASIX) infusion 15 mg/hr (06/24/18 0035)  . milrinone 0.25 mcg/kg/min (06/24/18 0352)     LOS: 8 days    Time spent: 35 minutes.     Hosie Poisson, MD [facility] Pager [phone]  If 7PM-7AM, please contact night-coverage [email or web address] Password [email or web address] 06/24/2018, 7:50 AM

## 2018-06-24 NOTE — Progress Notes (Addendum)
Advanced Heart Failure Rounding Note  PCP-Cardiologist: Jenkins Rouge, MD   Subjective:    Coox 64.1% on milrinone 0.25 mcg/kg/min and lasix drip at 15 mg/hr.   Weight down 5 lbs. Cr 1.50 CVP 8-9 cm on my check. I/Os inaccurate.   Feeling much "lighter" today. Breathing has improved. Denies lightheadedness or dizziness.   Cath 06/22/18  showed nonobstructive CAD, preserved CO (on milrinone), and right and left elevated filling pressures as below.   LHC 06/22/18: Left Main  Calcified and relatively short left main with about 40% stenosis.  Left Anterior Descending  40% mid LAD stenosis.  Left Circumflex  50% stenosis proximal LCx just prior to stent. Patient stent proximal LCx.  Right Coronary Artery  40% mid RCA stenosis.   RHC 06/22/18: RA mean 13 RV 48/13 PA 49/16, mean 27 PCWP mean 19 LV 110/27 AO 108/57  Oxygen saturations: PA 68% AO 98%  Cardiac Output (Fick) 5.04  Cardiac Index (Fick) 3.16 PVR 1.59 WU  Cardiac Output (Thermo) 3.68 Cardiac Index (Thermo) 2.31  Objective:   Weight Range: 114 lb 3.2 oz (51.8 kg) Body mass index is 19.91 kg/m.   Vital Signs:   Temp:  [97.6 F (36.4 C)-98.1 F (36.7 C)] 97.8 F (36.6 C) (07/19 0725) Pulse Rate:  [80-97] 80 (07/19 0725) Resp:  [14-24] 16 (07/19 0725) BP: (102-123)/(52-71) 113/57 (07/19 0725) SpO2:  [97 %-99 %] 98 % (07/19 0307) Weight:  [114 lb 3.2 oz (51.8 kg)] 114 lb 3.2 oz (51.8 kg) (07/19 0609) Last BM Date: 06/20/18  Weight change: Filed Weights   06/22/18 0500 06/23/18 0500 06/24/18 0609  Weight: 124 lb 3.2 oz (56.3 kg) 119 lb 7.8 oz (54.2 kg) 114 lb 3.2 oz (51.8 kg)    Intake/Output:   Intake/Output Summary (Last 24 hours) at 06/24/2018 0809 Last data filed at 06/24/2018 0700 Gross per 24 hour  Intake 1205.33 ml  Output -  Net 1205.33 ml      Physical Exam   CVP 8-9  General:  No resp difficulty. HEENT: Normal Neck: Supple. JVP at least 8-9 cm. Carotids 2+ bilat; no bruits. No  thyromegaly or nodule noted. Cor: PMI laterally displaced. RRR, 2/6 HSM at apex Lungs: CTAB, normal effort. Abdomen: Soft, non-tender, non-distended, no HSM. No bruits or masses. +BS   Extremities: No cyanosis, clubbing, or rash. R and LLE 1+ edema  Neuro: Alert & orientedx3, cranial nerves grossly intact. moves all 4 extremities w/o difficulty. Affect pleasant  Telemetry   NSR, Personally reviewed.   EKG    No new tracings.   Labs    CBC Recent Labs    06/23/18 0541 06/24/18 0555  WBC 5.5 6.1  NEUTROABS 4.0 4.2  HGB 10.9* 11.4*  HCT 35.8* 36.4  MCV 93.0 91.7  PLT 118* 476*   Basic Metabolic Panel Recent Labs    06/22/18 0500  06/23/18 0541 06/24/18 0555  NA  --    < > 136 133*  K  --    < > 3.4* 3.5  CL  --    < > 87* 84*  CO2  --    < > 38* 36*  GLUCOSE  --    < > 126* 111*  BUN  --    < > 18 18  CREATININE  --    < > 1.51* 1.50*  CALCIUM  --    < > 9.3 9.2  MG 2.2  --  1.8  --    < > =  values in this interval not displayed.   Liver Function Tests Recent Labs    06/23/18 0541 06/24/18 0555  AST 61* 42*  ALT 171* 129*  ALKPHOS 93 93  BILITOT 1.4* 1.6*  PROT 6.3* 6.1*  ALBUMIN 3.2* 3.2*   No results for input(s): LIPASE, AMYLASE in the last 72 hours. Cardiac Enzymes No results for input(s): CKTOTAL, CKMB, CKMBINDEX, TROPONINI in the last 72 hours.  BNP: BNP (last 3 results) Recent Labs    06/04/18 0132 06/15/18 1617 06/18/18 0601  BNP 3,135.7* 2,878.6* 2,926.1*    ProBNP (last 3 results) No results for input(s): PROBNP in the last 8760 hours.   D-Dimer No results for input(s): DDIMER in the last 72 hours. Hemoglobin A1C No results for input(s): HGBA1C in the last 72 hours. Fasting Lipid Panel No results for input(s): CHOL, HDL, LDLCALC, TRIG, CHOLHDL, LDLDIRECT in the last 72 hours. Thyroid Function Tests No results for input(s): TSH, T4TOTAL, T3FREE, THYROIDAB in the last 72 hours.  Invalid input(s): FREET3  Other  results:   Imaging    . No results found.   Medications:     Scheduled Medications: . aspirin  81 mg Oral Daily  . azelastine  1 spray Each Nare Daily  . citalopram  20 mg Oral Daily  . digoxin  0.125 mg Oral Daily  . ezetimibe  5 mg Oral Once per day on Mon Thu  . famotidine  20 mg Oral Daily  . ferrous sulfate  325 mg Oral Q breakfast  . fluticasone  2 spray Each Nare Daily  . heparin  5,000 Units Subcutaneous Q8H  . isosorbide mononitrate  30 mg Oral Daily  . loratadine  10 mg Oral Daily  . mometasone-formoterol  2 puff Inhalation BID  . nicotine  21 mg Transdermal Daily  . omega-3 acid ethyl esters  2 g Oral Daily  . pantoprazole  40 mg Oral Daily  . potassium chloride  40 mEq Oral BID  . rosuvastatin  5 mg Oral QODAY  . sodium chloride flush  10-40 mL Intracatheter Q12H  . sodium chloride flush  3 mL Intravenous Q12H  . spironolactone  25 mg Oral Daily  .   Infusions: . sodium chloride    . furosemide (LASIX) infusion 15 mg/hr (06/24/18 0035)  . milrinone 0.25 mcg/kg/min (06/24/18 0352)  .   PRN Medications: . sodium chloride, acetaminophen, hydrALAZINE, morphine injection, nitroGLYCERIN, ondansetron (ZOFRAN) IV, polyethylene glycol, sodium chloride flush, sodium chloride flush, zolpidem    Patient Profile   Patient has long history of CAD with prior anterior MI. Last cath in 8/15 with nonobstructive disease. She has ischemic cardiomyopathy with extensive scarring by 12/09 cardiac MRI.  Most recent echo was done this admission, showing EF 15% with moderate LV dilation and mildly decreased RV systolic function.  She additionally is an active smoker with COPD.  Baseline creatinine around 1.8, CKD stage 3.   Admitted with volume overload with poor response to lasix.   Assessment/Plan   1. Acute on chronic systolic CHF: - Ischemic cardiomyopathy, echo in 7/19 with EF 15%, mildly decreased RV systolic function, moderate MR, moderate TR.  Symptoms have been  worsening over a number of weeks, had recent admission at the end of June but probably was not fully diuresed prior to discharge.  Today, she is markedly volume overloaded on exam. Suspect that her GI symptoms are due to RV failure with congestive hepatopathy/elevated LFTs.  - Volume status elevated. CVP at least 8-9 cm -  Continue milrinone 0.25 mcg/kg/min. Coox 64.1%. - Continue lasix drip @ 15 mg/hr for today.  - Continue spiro 25 mg daily. SBP 90-100s - Continue digoxin 0.125 mg daily  - No beta blocker with low output.   - No ACEI/ARB/ARNI with AKI. May be able to start low dose ARB tomorrow if creatinine stable after cath.  - Overall picture, as above, is concerning for low output HF with RV failure and cardiorenal syndrome.  If we can stabilize her, LVAD may be needed in the future.  She would not be a transplant candidate with active smoking and age.  - RHC 06/22/18 showed elevated filling pressures on right and left with preserved CO (on milrinone) 2. AKI on CKD stage 3: As above, concerned for cardiorenal syndrome with low output HF.  Hopefully milrinone + diuresis with lowering renal venous pressure will help.  - Creatinine trending down with milrinone and diuresis, now 1.50 and stable 3. 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  Workup for gallbladder disease unremarkable.  Follow closely, hopefully will improve with diuresis.  - LFTs trending down. 4. COPD:  - Emphysema on CT chest.  Needs to quit smoking (discussed). 5. CAD:  - No s/s of ischemia.    - As above, nonobstructive disease on last cath in 8/15. LHC 06/22/18 with nonobstructive CAD.  - Continue ASA 81 daily.  - Continue Crestor 5 mg qod (cannot tolerate higher dose).  6. PAD: Chronic claudication with left SFA occluded. No change  7. Carotid stenosis: -  Moderate RICA stenosis on 9/18 dopplers. No change.  8. Hypokalemia - K 3.5 this am. Will supp.  9. NSVT - Goal  > 4.0 and Mg > 2.0.   Continue to  diurese for at least today. Seems improved on milrinone.   Length of Stay: [place], [place]  06/24/2018, 8:09 AM  Advanced Heart Failure Team Pager [phone] (M-F; 7a - 4p)  Please contact [facility] for night-coverage after hours (4p -7a ) and weekends on [email or web address]  Patient seen with PA, agree with the above note.This morning, CVP improved at 8-9 after good diuresis (down 5 lbs).  Co-ox 64%.   Creatinine stable at 1.5.   On exam, JVP8-9 cm, regular S1S2 with 2/6 HSM LLSB/apex. 1+ ankle edema.   Continue milrinone 0.25 today andLasix15 mg/hr for 1 more day, probably to po diuretic tomorrow.  Hold off on metolazone today.Continue spironolactone (increase to 25 mg daily)and digoxin. Replace K and will add low dose losartan 12.5 daily.   She may be an LVAD candidate in the future with low output HF.  Hopefully will be able to wean off milrinone when she is fully diuresed (probably starting tomorrow).   Loralie Champagne 06/24/2018 9:04 AM

## 2018-06-25 DIAGNOSIS — E871 Hypo-osmolality and hyponatremia: Secondary | ICD-10-CM

## 2018-06-25 LAB — CBC WITH DIFFERENTIAL/PLATELET
Abs Immature Granulocytes: 0 10*3/uL (ref 0.0–0.1)
Basophils Absolute: 0 10*3/uL (ref 0.0–0.1)
Basophils Relative: 1 %
EOS ABS: 0.1 10*3/uL (ref 0.0–0.7)
EOS PCT: 2 %
HCT: 37.6 % (ref 36.0–46.0)
Hemoglobin: 11.5 g/dL — ABNORMAL LOW (ref 12.0–15.0)
Immature Granulocytes: 0 %
LYMPHS PCT: 18 %
Lymphs Abs: 1 10*3/uL (ref 0.7–4.0)
MCH: 28 pg (ref 26.0–34.0)
MCHC: 30.6 g/dL (ref 30.0–36.0)
MCV: 91.7 fL (ref 78.0–100.0)
Monocytes Absolute: 0.8 10*3/uL (ref 0.1–1.0)
Monocytes Relative: 13 %
Neutro Abs: 3.8 10*3/uL (ref 1.7–7.7)
Neutrophils Relative %: 66 %
Platelets: 130 10*3/uL — ABNORMAL LOW (ref 150–400)
RBC: 4.1 MIL/uL (ref 3.87–5.11)
RDW: 17.5 % — AB (ref 11.5–15.5)
WBC: 5.7 10*3/uL (ref 4.0–10.5)

## 2018-06-25 LAB — BASIC METABOLIC PANEL
Anion gap: 10 (ref 5–15)
BUN: 21 mg/dL (ref 8–23)
CHLORIDE: 85 mmol/L — AB (ref 98–111)
CO2: 36 mmol/L — ABNORMAL HIGH (ref 22–32)
CREATININE: 1.86 mg/dL — AB (ref 0.44–1.00)
Calcium: 9.1 mg/dL (ref 8.9–10.3)
GFR calc non Af Amer: 27 mL/min — ABNORMAL LOW (ref >60)
GFR, EST AFRICAN AMERICAN: 31 mL/min — AB (ref >60)
Glucose, Bld: 144 mg/dL — ABNORMAL HIGH (ref 70–99)
POTASSIUM: 4 mmol/L (ref 3.5–5.1)
Sodium: 131 mmol/L — ABNORMAL LOW (ref 135–145)

## 2018-06-25 LAB — MAGNESIUM: Magnesium: 2.5 mg/dL — ABNORMAL HIGH (ref 1.7–2.4)

## 2018-06-25 LAB — COOXEMETRY PANEL
Carboxyhemoglobin: 1.8 % — ABNORMAL HIGH (ref 0.5–1.5)
Methemoglobin: 1.4 % (ref 0.0–1.5)
O2 Saturation: 60.9 %
Total hemoglobin: 11.7 g/dL — ABNORMAL LOW (ref 12.0–16.0)

## 2018-06-25 MED ORDER — MILRINONE LACTATE IN DEXTROSE 20-5 MG/100ML-% IV SOLN
0.1250 ug/kg/min | INTRAVENOUS | Status: DC
  Administered 2018-06-25: 0.125 ug/kg/min via INTRAVENOUS
  Filled 2018-06-25: qty 100

## 2018-06-25 NOTE — Plan of Care (Signed)
Patient continues to progress toward care goals.  Off Lasix gtt; remains on Milrinone.  Breathing, LFTs, nausea and vomiting sx have all improved.  Will continue to monitor closely.  HF team following.

## 2018-06-25 NOTE — Progress Notes (Signed)
PROGRESS NOTE    [name]  XBW:[id number] DOB: 12-29-1950 DOA: 06/15/2018 PCP: [facility]    Brief Narrative: 67 year old female with a history of hypertension, hyperlipidemia, asthma, GERD, depression, CAD, MI, CHF with EF 20%, tobacco abuse, CKD stage III, ICD placement, iron deficiency anemia came to hospital with nausea vomiting abdominal pain for the past 3 days. Patient was seen by general surgery and also underwent HIDA scan which was negative for biliary disease. Her nausea, vomiting and abdominal pain have improved. Her liver function tests are improving. She is walking in the hallway. She reports occasional headache.    Assessment & Plan:   Principal Problem:   Abdominal pain Active Problems:   Essential hypertension   Tobacco abuse   Implantable cardioverter-defibrillator (ICD) in situ   Hyperlipidemia   Acute renal failure superimposed on stage 3 chronic kidney disease (HCC)   Asthma   CAD (coronary artery disease)   Acute on chronic systolic CHF (congestive heart failure) (HCC)   Hyponatremia   Hypokalemia   Rectal bleeding   Elevated troponin   Iron deficiency anemia   Abnormal LFTs   AKI (acute kidney injury) (HCC)   Intractable Nausea, vomiting and abdominal  pain: Suspect from hepatic congestion from CHF, CT  abdomen and pelvis is negative.  HIDA IS negative. Surgery consulted and recommendations given.  IV diuresis with IV lasix with improvement of the liver function tests. As her creatinine worsens, IV lasix discontinued.  Symptoms have improved.    Acute systolic CHF:  Echocardiogram showed LVEF of 20%. Underwent LHC AND RHC, mild non obstructive CAD, and fluid overloaded.  Cardiology on board and managing the fluid overload.  Pt on milrinone gtt . Prn metolazone. IV lasix discontinued on 7/19.  Spironolactone added on 7/18 25 mg daily. Continue the same.  Resume digoxin 0.125 mg daily.  No Ace inhibitor due to  renal insufficiency.  No urine output recorded yesterday.  Her breathing has improved. No sob or chest pain.    Abnormal LFT'S  Improving.  Possibly from congestion.  No nausea or vomiting.    Acute on Stage 3 CKD  baseline creatinine at 1.2 , currently the Creatinine at 1.86 today and stable.    CAD No further work up at this time.    Rectal bleeding likely from hemorrhoids. Hemoglobin stable around 10.    Anemia of chronic disease/ Iron deficiency anemia:  Iron supplementation.  Hemoglobin stable around 11.   Hyponatremia:  Possibly from fluid overload.  Improved.    COPD:  No wheezing heard. Currently on Dulera.    Peripheral Arterial Disease:  Stable. On aspirin   Mild Thrombocytopenia:  Unclear etiology.  No signs of bleeding.    Hyperlipidemia:  Resume zetia and crestor.   Tobacco Use:  On Nicotine patch.    GERD Stable on protonix.   Hypertension  Well controlled.  Resume Imdur.    DVT prophylaxis:  Heparin sq Code Status:  Full code.  Family Communication: none at bedside.  Disposition Plan: pending clnical improvement and PT evaluation.   Consultants:   CARDIOLOGY.    Procedures:   Echocardiogram  LHC  RHC   Antimicrobials: NONE.    Subjective: No chest pain or sob. No nausea today. Slight headache.   Objective: Vitals:   06/25/18 0259 06/25/18 0552 06/25/18 0720 06/25/18 0807  BP: (!) 96/55  (!) 107/57   Pulse: 85  80   Resp: 19  16   Temp: 98  F (36.7 C)  97.9 F (36.6 C)   TempSrc: Oral  Oral   SpO2: 99%  100% 100%  Weight:  51.4 kg (113 lb 4.8 oz)    Height:        Intake/Output Summary (Last 24 hours) at 06/25/2018 1040 Last data filed at 06/25/2018 0900 Gross per 24 hour  Intake 870.75 ml  Output 3450 ml  Net -2579.25 ml   Filed Weights   06/23/18 0500 06/24/18 0609 06/25/18 0552  Weight: 54.2 kg (119 lb 7.8 oz) 51.8 kg (114 lb 3.2 oz) 51.4 kg (113 lb 4.8 oz)    Examination:  General exam:  comfortable, in bed, not in distress. On RA.  Respiratory system: AIR ENTRY fair, no wheezing or rhonchi.  Cardiovascular system: s1s2, RRR, no pedal edema.  Gastrointestinal system: Abdomen is soft NT ND BS+ Central nervous system: Alert and oriented. Non focal.  ExtremitieS: no cyanosis or clubbing. No pedal edema.  Skin: No rashes, lesions or ulcers Psychiatry:  Mood & affect appropriate.     Data Reviewed: I have personally reviewed following labs and imaging studies  CBC: Recent Labs  Lab 06/21/18 0500 06/22/18 0529 06/23/18 0541 06/24/18 0555 06/25/18 0457  WBC 6.4 6.1 5.5 6.1 5.7  NEUTROABS 4.9 4.5 4.0 4.2 3.8  HGB 10.3* 10.3* 10.9* 11.4* 11.5*  HCT 33.8* 33.2* 35.8* 36.4 37.6  MCV 91.8 92.7 93.0 91.7 91.7  PLT 107* 103* 118* 123* 025*   Basic Metabolic Panel: Recent Labs  Lab 06/22/18 0500 06/22/18 0529 06/22/18 1845 06/23/18 0541 06/24/18 0555 06/25/18 0457  NA  --  135 134* 136 133* 131*  K  --  2.8* 3.5 3.4* 3.5 4.0  CL  --  87* 88* 87* 84* 85*  CO2  --  37* 35* 38* 36* 36*  GLUCOSE  --  128* 136* 126* 111* 144*  BUN  --  23 22 18 18 21   CREATININE  --  1.46* 1.47* 1.51* 1.50* 1.86*  CALCIUM  --  9.0 9.2 9.3 9.2 9.1  MG 2.2  --   --  1.8  --  2.5*   GFR: Estimated Creatinine Clearance: 23.2 mL/min (A) (by C-G formula based on SCr of 1.86 mg/dL (H)). Liver Function Tests: Recent Labs  Lab 06/19/18 0923 06/20/18 0453 06/21/18 0500 06/23/18 0541 06/24/18 0555  AST 310* 167* 114* 61* 42*  ALT 462* 305* 260* 171* 129*  ALKPHOS 95 81 90 93 93  BILITOT 1.7* 1.5* 1.2 1.4* 1.6*  PROT 6.3* 5.3* 5.7* 6.3* 6.1*  ALBUMIN 3.1* 2.7* 2.9* 3.2* 3.2*   No results for input(s): LIPASE, AMYLASE in the last 168 hours. No results for input(s): AMMONIA in the last 168 hours. Coagulation Profile: No results for input(s): INR, PROTIME in the last 168 hours. Cardiac Enzymes: No results for input(s): CKTOTAL, CKMB, CKMBINDEX, TROPONINI in the last 168 hours. BNP  (last 3 results) No results for input(s): PROBNP in the last 8760 hours. HbA1C: No results for input(s): HGBA1C in the last 72 hours. CBG: No results for input(s): GLUCAP in the last 168 hours. Lipid Profile: No results for input(s): CHOL, HDL, LDLCALC, TRIG, CHOLHDL, LDLDIRECT in the last 72 hours. Thyroid Function Tests: No results for input(s): TSH, T4TOTAL, FREET4, T3FREE, THYROIDAB in the last 72 hours. Anemia Panel: No results for input(s): VITAMINB12, FOLATE, FERRITIN, TIBC, IRON, RETICCTPCT in the last 72 hours. Sepsis Labs: No results for input(s): PROCALCITON, LATICACIDVEN in the last 168 hours.  No results found for this  or any previous visit (from the past 240 hour(s)).       Radiology Studies: No results found.      Scheduled Meds: . aspirin  81 mg Oral Daily  . azelastine  1 spray Each Nare Daily  . citalopram  20 mg Oral Daily  . digoxin  0.125 mg Oral Daily  . ezetimibe  5 mg Oral Once per day on Mon Thu  . famotidine  20 mg Oral Daily  . ferrous sulfate  325 mg Oral Q breakfast  . fluticasone  2 spray Each Nare Daily  . heparin  5,000 Units Subcutaneous Q8H  . isosorbide mononitrate  30 mg Oral Daily  . loratadine  10 mg Oral Daily  . losartan  12.5 mg Oral Daily  . mometasone-formoterol  2 puff Inhalation BID  . nicotine  21 mg Transdermal Daily  . omega-3 acid ethyl esters  2 g Oral Daily  . pantoprazole  40 mg Oral Daily  . rosuvastatin  5 mg Oral QODAY  . sodium chloride flush  10-40 mL Intracatheter Q12H  . sodium chloride flush  3 mL Intravenous Q12H  . spironolactone  25 mg Oral Daily   Continuous Infusions: . sodium chloride    . milrinone 0.125 mcg/kg/min (06/25/18 0923)     LOS: 9 days    Time spent: 35 minutes.     Hosie Poisson, MD [facility] Pager [phone]  If 7PM-7AM, please contact night-coverage [email or web address] Password [email or web address] 06/25/2018, 10:40 AM

## 2018-06-25 NOTE — Progress Notes (Signed)
1024 Observed pt walking independently in hallway. Pt walked three times yesterday. She prefers to walk on her own as offered to walk with her. Gait appears steady .Graylon Good RN BSN 06/25/2018 10:25 AM

## 2018-06-25 NOTE — Progress Notes (Signed)
Patient ID: [name], female   DOB: January 11, 1951, 67 y.o.   MRN: [id number]     Advanced Heart Failure Rounding Note  PCP-Cardiologist: Jenkins Rouge, MD   Subjective:    Coox 61% on milrinone 0.25 mcg/kg/min and lasix drip at 15 mg/hr. Good diuresis again yesterday.  Today, CVP 4.  Creatinine up a bit to 1.86.   Breathing improved.    Cath 06/22/18  showed nonobstructive CAD, preserved CO (on milrinone), and right and left elevated filling pressures as below.   LHC 06/22/18: Left Main  Calcified and relatively short left main with about 40% stenosis.  Left Anterior Descending  40% mid LAD stenosis.  Left Circumflex  50% stenosis proximal LCx just prior to stent. Patient stent proximal LCx.  Right Coronary Artery  40% mid RCA stenosis.   RHC 06/22/18: RA mean 13 RV 48/13 PA 49/16, mean 27 PCWP mean 19 LV 110/27 AO 108/57  Oxygen saturations: PA 68% AO 98%  Cardiac Output (Fick) 5.04  Cardiac Index (Fick) 3.16 PVR 1.59 WU  Cardiac Output (Thermo) 3.68 Cardiac Index (Thermo) 2.31  Objective:   Weight Range: 113 lb 4.8 oz (51.4 kg) Body mass index is 19.76 kg/m.   Vital Signs:   Temp:  [97.9 F (36.6 C)-98.2 F (36.8 C)] 97.9 F (36.6 C) (07/20 0720) Pulse Rate:  [80-90] 80 (07/20 0720) Resp:  [15-19] 16 (07/20 0720) BP: (93-111)/(46-65) 107/57 (07/20 0720) SpO2:  [97 %-100 %] 100 % (07/20 0807) Weight:  [113 lb 4.8 oz (51.4 kg)] 113 lb 4.8 oz (51.4 kg) (07/20 0552) Last BM Date: 06/20/18  Weight change: Filed Weights   06/23/18 0500 06/24/18 0609 06/25/18 0552  Weight: 119 lb 7.8 oz (54.2 kg) 114 lb 3.2 oz (51.8 kg) 113 lb 4.8 oz (51.4 kg)    Intake/Output:   Intake/Output Summary (Last 24 hours) at 06/25/2018 0914 Last data filed at 06/25/2018 0700 Gross per 24 hour  Intake 630.75 ml  Output 3150 ml  Net -2519.25 ml      Physical Exam   CVP 4  General: NAD Neck: No JVD, no thyromegaly or thyroid nodule.  Lungs: Clear to auscultation  bilaterally with normal respiratory effort. CV: Lateral PMI.  Heart regular S1/S2, no S3/S4, 2/6 HSM LLSB.  No peripheral edema.   Abdomen: Soft, nontender, no hepatosplenomegaly, no distention.  Skin: Intact without lesions or rashes.  Neurologic: Alert and oriented x 3.  Psych: Normal affect. Extremities: No clubbing or cyanosis.  HEENT: Normal.    Telemetry   NSR 80s, Personally reviewed.   EKG    No new tracings.   Labs    CBC Recent Labs    06/24/18 0555 06/25/18 0457  WBC 6.1 5.7  NEUTROABS 4.2 3.8  HGB 11.4* 11.5*  HCT 36.4 37.6  MCV 91.7 91.7  PLT 123* 354*   Basic Metabolic Panel Recent Labs    06/23/18 0541 06/24/18 0555 06/25/18 0457  NA 136 133* 131*  K 3.4* 3.5 4.0  CL 87* 84* 85*  CO2 38* 36* 36*  GLUCOSE 126* 111* 144*  BUN 18 18 21   CREATININE 1.51* 1.50* 1.86*  CALCIUM 9.3 9.2 9.1  MG 1.8  --  2.5*   Liver Function Tests Recent Labs    06/23/18 0541 06/24/18 0555  AST 61* 42*  ALT 171* 129*  ALKPHOS 93 93  BILITOT 1.4* 1.6*  PROT 6.3* 6.1*  ALBUMIN 3.2* 3.2*   No results for input(s): LIPASE, AMYLASE in the last 72  hours. Cardiac Enzymes No results for input(s): CKTOTAL, CKMB, CKMBINDEX, TROPONINI in the last 72 hours.  BNP: BNP (last 3 results) Recent Labs    06/04/18 0132 06/15/18 1617 06/18/18 0601  BNP 3,135.7* 2,440.1* 2,926.1*    ProBNP (last 3 results) No results for input(s): PROBNP in the last 8760 hours.   D-Dimer No results for input(s): DDIMER in the last 72 hours. Hemoglobin A1C No results for input(s): HGBA1C in the last 72 hours. Fasting Lipid Panel No results for input(s): CHOL, HDL, LDLCALC, TRIG, CHOLHDL, LDLDIRECT in the last 72 hours. Thyroid Function Tests No results for input(s): TSH, T4TOTAL, T3FREE, THYROIDAB in the last 72 hours.  Invalid input(s): FREET3  Other results:   Imaging    No results found.   Medications:     Scheduled Medications: . aspirin  81 mg Oral Daily  .  azelastine  1 spray Each Nare Daily  . citalopram  20 mg Oral Daily  . digoxin  0.125 mg Oral Daily  . ezetimibe  5 mg Oral Once per day on Mon Thu  . famotidine  20 mg Oral Daily  . ferrous sulfate  325 mg Oral Q breakfast  . fluticasone  2 spray Each Nare Daily  . heparin  5,000 Units Subcutaneous Q8H  . isosorbide mononitrate  30 mg Oral Daily  . loratadine  10 mg Oral Daily  . losartan  12.5 mg Oral Daily  . mometasone-formoterol  2 puff Inhalation BID  . nicotine  21 mg Transdermal Daily  . omega-3 acid ethyl esters  2 g Oral Daily  . pantoprazole  40 mg Oral Daily  . rosuvastatin  5 mg Oral QODAY  . sodium chloride flush  10-40 mL Intracatheter Q12H  . sodium chloride flush  3 mL Intravenous Q12H  . spironolactone  25 mg Oral Daily    Infusions: . sodium chloride    . milrinone      PRN Medications: sodium chloride, acetaminophen, hydrALAZINE, LORazepam, morphine injection, nitroGLYCERIN, ondansetron (ZOFRAN) IV, polyethylene glycol, sodium chloride flush, sodium chloride flush, zolpidem    Patient Profile   Patient has long history of CAD with prior anterior MI. Last cath in 8/15 with nonobstructive disease. She has ischemic cardiomyopathy with extensive scarring by 12/09 cardiac MRI.  Most recent echo was done this admission, showing EF 15% with moderate LV dilation and mildly decreased RV systolic function.  She additionally is an active smoker with COPD.  Baseline creatinine around 1.8, CKD stage 3.   Admitted with volume overload with poor response to lasix.   Assessment/Plan   1. Acute on chronic systolic CHF: Ischemic cardiomyopathy, echo in 7/19 with EF 15%, mildly decreased RV systolic function, moderate MR, moderate TR.  Symptoms have been worsening over a number of weeks, had recent admission at the end of June but probably was not fully diuresed prior to discharge.  [place] 06/22/18 showed elevated filling pressures on right and left with preserved CO (on  milrinone).  She has diuresed well on IV Lasix and milrinone 0.25. CVP down to 4 today with co-ox 61%.  Creatinine starting to rise again at 1.8 this morning.  Will not titrate po cardiomyopathy meds with soft BP (90s-100s).  - Stop Lasix gtt.  Will reassess creatinine/CVP tomorrow, will need to start on po diuretics (not today).  - Decrease milrinone to 0.125 today, repeat co-ox in am.   - Continue spironolactone 25 daily.  - Continue digoxin 0.125 mg daily  - Continue losartan 12.5  daily.  - Overall picture, as above, is concerning for low output HF with RV failure and cardiorenal syndrome.  If we can stabilize her, LVAD may be needed in the future.  She would not be a transplant candidate with active smoking and age.  2. AKI on CKD stage 3: As above, concerned for cardiorenal syndrome with low output HF.  Creatinine > 2 initially, improved with diuresis/milrinone, now trending up.  - Stop Lasix gtt today with CVP 4.  3. 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  Workup for gallbladder disease unremarkable.  Improved with diuresis.  4. COPD: Emphysema on CT chest.  Needs to quit smoking (discussed). 5. CAD: Nonobstructive disease on last cath in 8/15. LHC 06/22/18 with nonobstructive CAD.  - Continue ASA 81 daily.  - Continue Crestor 5 mg qod (cannot tolerate higher dose).  6. PAD: Chronic claudication with left SFA occluded. No change  7. Carotid stenosis: Moderate RICA stenosis on 9/18 dopplers. No change.  8. Hypokalemia: Stop KCl supplementation now that off Lasix gtt.    Mobilize out of bed.   Length of Stay: 9  Loralie Champagne, MD  06/25/2018, 9:14 AM  Advanced Heart Failure Team Pager [phone] (M-F; 7a - 4p)  Please contact [facility] for night-coverage after hours (4p -7a ) and weekends on [email or web address]

## 2018-06-26 LAB — CBC WITH DIFFERENTIAL/PLATELET
ABS IMMATURE GRANULOCYTES: 0 10*3/uL (ref 0.0–0.1)
BASOS ABS: 0 10*3/uL (ref 0.0–0.1)
BASOS PCT: 1 %
Eosinophils Absolute: 0.1 10*3/uL (ref 0.0–0.7)
Eosinophils Relative: 2 %
HCT: 38.9 % (ref 36.0–46.0)
HEMOGLOBIN: 12.1 g/dL (ref 12.0–15.0)
IMMATURE GRANULOCYTES: 1 %
Lymphocytes Relative: 20 %
Lymphs Abs: 1.2 10*3/uL (ref 0.7–4.0)
MCH: 28.4 pg (ref 26.0–34.0)
MCHC: 31.1 g/dL (ref 30.0–36.0)
MCV: 91.3 fL (ref 78.0–100.0)
MONO ABS: 0.9 10*3/uL (ref 0.1–1.0)
Monocytes Relative: 14 %
NEUTROS ABS: 4 10*3/uL (ref 1.7–7.7)
NEUTROS PCT: 64 %
PLATELETS: 145 10*3/uL — AB (ref 150–400)
RBC: 4.26 MIL/uL (ref 3.87–5.11)
RDW: 17.6 % — ABNORMAL HIGH (ref 11.5–15.5)
WBC: 6.2 10*3/uL (ref 4.0–10.5)

## 2018-06-26 LAB — BASIC METABOLIC PANEL
ANION GAP: 9 (ref 5–15)
BUN: 21 mg/dL (ref 8–23)
CALCIUM: 9.1 mg/dL (ref 8.9–10.3)
CO2: 33 mmol/L — AB (ref 22–32)
Chloride: 88 mmol/L — ABNORMAL LOW (ref 98–111)
Creatinine, Ser: 1.43 mg/dL — ABNORMAL HIGH (ref 0.44–1.00)
GFR, EST AFRICAN AMERICAN: 42 mL/min — AB (ref >60)
GFR, EST NON AFRICAN AMERICAN: 36 mL/min — AB (ref >60)
GLUCOSE: 106 mg/dL — AB (ref 70–99)
Potassium: 3.8 mmol/L (ref 3.5–5.1)
Sodium: 130 mmol/L — ABNORMAL LOW (ref 135–145)

## 2018-06-26 LAB — COOXEMETRY PANEL
Carboxyhemoglobin: 1.5 % (ref 0.5–1.5)
METHEMOGLOBIN: 1.2 % (ref 0.0–1.5)
O2 Saturation: 63.6 %
Total hemoglobin: 12 g/dL (ref 12.0–16.0)

## 2018-06-26 MED ORDER — TORSEMIDE 20 MG PO TABS
40.0000 mg | ORAL_TABLET | Freq: Every day | ORAL | Status: DC
  Administered 2018-06-26 – 2018-06-28 (×3): 40 mg via ORAL
  Filled 2018-06-26 (×3): qty 2

## 2018-06-26 MED ORDER — LOSARTAN POTASSIUM 25 MG PO TABS
12.5000 mg | ORAL_TABLET | Freq: Two times a day (BID) | ORAL | Status: DC
  Administered 2018-06-26 – 2018-06-28 (×5): 12.5 mg via ORAL
  Filled 2018-06-26 (×5): qty 1

## 2018-06-26 NOTE — Progress Notes (Signed)
Patient ID: [name], female   DOB: [birth year], 67 y.o.   MRN: [id number]     Advanced Heart Failure Rounding Note  PCP-Cardiologist: Jenkins Rouge, MD   Subjective:    Coox 64% on milrinone 0.125 mcg/kg/min, diuretics off yesterday. Today, CVP 7.  Creatinine lower at 1.4.    Breathing improved overall, walking in hall.    Cath 06/22/18  showed nonobstructive CAD, preserved CO (on milrinone), and right and left elevated filling pressures as below.   LHC 06/22/18: Left Main  Calcified and relatively short left main with about 40% stenosis.  Left Anterior Descending  40% mid LAD stenosis.  Left Circumflex  50% stenosis proximal LCx just prior to stent. Patient stent proximal LCx.  Right Coronary Artery  40% mid RCA stenosis.   RHC 06/22/18: RA mean 13 RV 48/13 PA 49/16, mean 27 PCWP mean 19 LV 110/27 AO 108/57 Oxygen saturations: PA 68% AO 98% Cardiac Output (Fick) 5.04  Cardiac Index (Fick) 3.16 PVR 1.59 WU Cardiac Output (Thermo) 3.68 Cardiac Index (Thermo) 2.31  Objective:   Weight Range: 115 lb 4.8 oz (52.3 kg) Body mass index is 20.1 kg/m.   Vital Signs:   Temp:  [97.5 F (36.4 C)-98.2 F (36.8 C)] 97.5 F (36.4 C) (07/21 0759) Pulse Rate:  [82-91] 84 (07/21 0759) Resp:  [16-20] 19 (07/21 0759) BP: (96-104)/(52-63) 99/55 (07/21 0759) SpO2:  [94 %-100 %] 94 % (07/21 0759) Weight:  [115 lb 4.8 oz (52.3 kg)] 115 lb 4.8 oz (52.3 kg) (07/21 0607) Last BM Date: 06/21/18  Weight change: Filed Weights   06/24/18 0609 06/25/18 0552 06/26/18 0607  Weight: 114 lb 3.2 oz (51.8 kg) 113 lb 4.8 oz (51.4 kg) 115 lb 4.8 oz (52.3 kg)    Intake/Output:   Intake/Output Summary (Last 24 hours) at 06/26/2018 0913 Last data filed at 06/26/2018 0003 Gross per 24 hour  Intake 397.95 ml  Output 500 ml  Net -102.05 ml      Physical Exam   CVP 7   General: NAD Neck: No JVD, no thyromegaly or thyroid nodule.  Lungs: Clear to auscultation bilaterally with normal  respiratory effort. CV: Nondisplaced PMI.  Heart regular S1/S2, no S3/S4, 2/6 HSM LLSB.  No peripheral edema.   Abdomen: Soft, nontender, no hepatosplenomegaly, no distention.  Skin: Intact without lesions or rashes.  Neurologic: Alert and oriented x 3.  Psych: Normal affect. Extremities: No clubbing or cyanosis.  HEENT: Normal.    Telemetry   NSR 90s with occasional PVCs, Personally reviewed.   EKG    No new tracings.   Labs    CBC Recent Labs    06/25/18 0457 06/26/18 0519  WBC 5.7 6.2  NEUTROABS 3.8 4.0  HGB 11.5* 12.1  HCT 37.6 38.9  MCV 91.7 91.3  PLT 130* 409*   Basic Metabolic Panel Recent Labs    06/25/18 0457 06/26/18 0519  NA 131* 130*  K 4.0 3.8  CL 85* 88*  CO2 36* 33*  GLUCOSE 144* 106*  BUN 21 21  CREATININE 1.86* 1.43*  CALCIUM 9.1 9.1  MG 2.5*  --    Liver Function Tests Recent Labs    06/24/18 0555  AST 42*  ALT 129*  ALKPHOS 93  BILITOT 1.6*  PROT 6.1*  ALBUMIN 3.2*   No results for input(s): LIPASE, AMYLASE in the last 72 hours. Cardiac Enzymes No results for input(s): CKTOTAL, CKMB, CKMBINDEX, TROPONINI in the last 72 hours.  BNP: BNP (last 3 results) Recent  Labs    06/04/18 0132 06/15/18 1617 06/18/18 0601  BNP 3,135.7* 9,147.8* 2,926.1*    ProBNP (last 3 results) No results for input(s): PROBNP in the last 8760 hours.   D-Dimer No results for input(s): DDIMER in the last 72 hours. Hemoglobin A1C No results for input(s): HGBA1C in the last 72 hours. Fasting Lipid Panel No results for input(s): CHOL, HDL, LDLCALC, TRIG, CHOLHDL, LDLDIRECT in the last 72 hours. Thyroid Function Tests No results for input(s): TSH, T4TOTAL, T3FREE, THYROIDAB in the last 72 hours.  Invalid input(s): FREET3  Other results:   Imaging    No results found.   Medications:     Scheduled Medications: . aspirin  81 mg Oral Daily  . azelastine  1 spray Each Nare Daily  . citalopram  20 mg Oral Daily  . digoxin  0.125 mg Oral  Daily  . ezetimibe  5 mg Oral Once per day on Mon Thu  . famotidine  20 mg Oral Daily  . ferrous sulfate  325 mg Oral Q breakfast  . fluticasone  2 spray Each Nare Daily  . heparin  5,000 Units Subcutaneous Q8H  . isosorbide mononitrate  30 mg Oral Daily  . loratadine  10 mg Oral Daily  . losartan  12.5 mg Oral BID  . mometasone-formoterol  2 puff Inhalation BID  . nicotine  21 mg Transdermal Daily  . omega-3 acid ethyl esters  2 g Oral Daily  . pantoprazole  40 mg Oral Daily  . rosuvastatin  5 mg Oral QODAY  . sodium chloride flush  10-40 mL Intracatheter Q12H  . sodium chloride flush  3 mL Intravenous Q12H  . spironolactone  25 mg Oral Daily  . torsemide  40 mg Oral Daily    Infusions: . sodium chloride      PRN Medications: sodium chloride, acetaminophen, hydrALAZINE, LORazepam, morphine injection, nitroGLYCERIN, ondansetron (ZOFRAN) IV, polyethylene glycol, sodium chloride flush, sodium chloride flush, zolpidem    Patient Profile   Patient has long history of CAD with prior anterior MI. Last cath in 8/15 with nonobstructive disease. She has ischemic cardiomyopathy with extensive scarring by 12/09 cardiac MRI.  Most recent echo was done this admission, showing EF 15% with moderate LV dilation and mildly decreased RV systolic function.  She additionally is an active smoker with COPD.  Baseline creatinine around 1.8, CKD stage 3.   Admitted with volume overload with poor response to lasix.   Assessment/Plan   1. Acute on chronic systolic CHF: Ischemic cardiomyopathy, echo in 7/19 with EF 15%, mildly decreased RV systolic function, moderate MR, moderate TR.  Symptoms have been worsening over a number of weeks, had recent admission at the end of June but probably was not fully diuresed prior to discharge.  [place] 06/22/18 showed elevated filling pressures on right and left with preserved CO (on milrinone).  She diuresed well on IV Lasix and milrinone, now off IV Lasix and milrinone  down to 0.125. CVP 7 today with co-ox 64%.  Creatinine lower today at 1.4.  SBP 90s-100s.  - Start torsemide 40 mg daily for home (was on Lasix 80 mg daily prior to admission).  - Stop milrinone today.   - Continue spironolactone 25 daily.  - Continue digoxin 0.125 mg daily  - Increase losartan to 12.5 mg bid.   - Overall picture has been concerning for low output HF with RV failure and cardiorenal syndrome.  LVAD may be needed in the future. It looks like she will  be able to successfully titrate off milrinone this admission.  She would not be a transplant candidate with active smoking and age.  2. AKI on CKD stage 3: As above, concerned for cardiorenal syndrome with low output HF.  Creatinine > 2 initially, improved with diuresis/milrinone, now down to 1.4.   3. 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  Workup for gallbladder disease unremarkable.  Improved with diuresis.  4. COPD: Emphysema on CT chest.  Needs to quit smoking (discussed). 5. CAD: Nonobstructive disease on last cath in 8/15. LHC 06/22/18 with nonobstructive CAD.  - Continue ASA 81 daily.  - Continue Crestor 5 mg qod (cannot tolerate higher dose).  6. PAD: Chronic claudication with left SFA occluded. No change  7. Carotid stenosis: Moderate RICA stenosis on 9/18 dopplers. No change.  8. Hypokalemia: Stable on spironolactone.     Mobilize out of bed. Possibly home Monday or Tuesday.   Length of Stay: [place], MD  06/26/2018, 9:13 AM  Advanced Heart Failure Team Pager [phone] (M-F; 7a - 4p)  Please contact [facility] for night-coverage after hours (4p -7a ) and weekends on [email or web address]

## 2018-06-26 NOTE — Progress Notes (Signed)
PROGRESS NOTE    [name]  NOM:[id number] DOB: 1951-10-02 DOA: 06/15/2018 PCP: [facility]    Brief Narrative: 67 year old female with a history of hypertension, hyperlipidemia, asthma, GERD, depression, CAD, MI, CHF with EF 20%, tobacco abuse, CKD stage III, ICD placement, iron deficiency anemia came to hospital with nausea vomiting abdominal pain for the past 3 days. Patient was seen by general surgery and also underwent HIDA scan which was negative for biliary disease. Her nausea, vomiting and abdominal pain have improved. Her liver function tests are improving. She is walking in the hallway. She reports occasional headache.    Assessment & Plan:   Principal Problem:   Abdominal pain Active Problems:   Essential hypertension   Tobacco abuse   Implantable cardioverter-defibrillator (ICD) in situ   Hyperlipidemia   Acute renal failure superimposed on stage 3 chronic kidney disease (HCC)   Asthma   CAD (coronary artery disease)   Acute on chronic systolic CHF (congestive heart failure) (HCC)   Hyponatremia   Hypokalemia   Rectal bleeding   Elevated troponin   Iron deficiency anemia   Abnormal LFTs   AKI (acute kidney injury) (HCC)   Intractable Nausea, vomiting and abdominal  pain: Suspect from hepatic congestion from CHF, CT  abdomen and pelvis is negative.  HIDA IS negative. Surgery consulted and recommendations given.  Symptoms have improved with improvement of congestion.    Acute systolic CHF:  Echocardiogram showed LVEF of 20%. Underwent LHC AND RHC, mild non obstructive CAD, and fluid overloaded.  Cardiology on board and managing the fluid overload.  Pt on milrinone gtt . Prn metolazone. IV lasix discontinued on 7/19.  Spironolactone added on 7/18 25 mg daily. Continue the same.  Resume digoxin 0.125 mg daily.  Restart oral lasix as per cardiology.  No Ace inhibitor due to renal insufficiency.  Her breathing has improved. No  sob or chest pain.    Abnormal LFT'S  Improving.  Possibly from congestion.  No nausea or vomiting.    Acute on Stage 3 CKD  baseline creatinine at 1.2 , creatinine improved to 1.4 today and stable.    CAD No further work up at this time. No chest pain or sob.    Rectal bleeding likely from hemorrhoids. Hemoglobin stable around 10.    Anemia of chronic disease/ Iron deficiency anemia:  Iron supplementation.  Hemoglobin stable around 11.   Hyponatremia:  Possibly from fluid overload.  Improved.    COPD:  No wheezing heard. Currently on Dulera.    Peripheral Arterial Disease:  Stable. On aspirin   Mild Thrombocytopenia:  Unclear etiology.  No signs of bleeding.    Hyperlipidemia:  Resume zetia and crestor.   Tobacco Use:  On Nicotine patch.    GERD Stable on protonix.   Hypertension  Well controlled.  Resume Imdur.    DVT prophylaxis:  Heparin sq Code Status:  Full code.  Family Communication: none at bedside.  Disposition Plan: possible d/c in am.   Consultants:   CARDIOLOGY.    Procedures:   Echocardiogram  LHC  RHC   Antimicrobials: NONE.    Subjective: No chest pain or sob.  No nausea, vomiting or abdominal pain.   Objective: Vitals:   06/26/18 0607 06/26/18 0759 06/26/18 0931 06/26/18 1617  BP:  (!) 99/55  (!) 96/57  Pulse:  84  82  Resp:  19  20  Temp:  (!) 97.5 F (36.4 C)  98.2 F (36.8 C)  TempSrc:  Oral  Oral  SpO2:  94% 94% 97%  Weight: 52.3 kg (115 lb 4.8 oz)     Height:        Intake/Output Summary (Last 24 hours) at 06/26/2018 1626 Last data filed at 06/26/2018 1400 Gross per 24 hour  Intake 927.95 ml  Output -  Net 927.95 ml   Filed Weights   06/24/18 0609 06/25/18 0552 06/26/18 0607  Weight: 51.8 kg (114 lb 3.2 oz) 51.4 kg (113 lb 4.8 oz) 52.3 kg (115 lb 4.8 oz)    Examination:  General exam: calm and comfortable, on RA.  Respiratory system: clear to auscultation, no wheezing or rhonchi.    Cardiovascular system: s1s2, RRR, no pedal edema.  Gastrointestinal system: Abdomen is soft non tender non distended bowel sounds heard.  Central nervous system: Alert and oriented. Non focal.  ExtremitieS: no cyanosis or clubbing. No pedal edema.  Skin: No rashes, lesions or ulcers Psychiatry:  Mood & affect appropriate.     Data Reviewed: I have personally reviewed following labs and imaging studies  CBC: Recent Labs  Lab 06/22/18 0529 06/23/18 0541 06/24/18 0555 06/25/18 0457 06/26/18 0519  WBC 6.1 5.5 6.1 5.7 6.2  NEUTROABS 4.5 4.0 4.2 3.8 4.0  HGB 10.3* 10.9* 11.4* 11.5* 12.1  HCT 33.2* 35.8* 36.4 37.6 38.9  MCV 92.7 93.0 91.7 91.7 91.3  PLT 103* 118* 123* 130* 875*   Basic Metabolic Panel: Recent Labs  Lab 06/22/18 0500  06/22/18 1845 06/23/18 0541 06/24/18 0555 06/25/18 0457 06/26/18 0519  NA  --    < > 134* 136 133* 131* 130*  K  --    < > 3.5 3.4* 3.5 4.0 3.8  CL  --    < > 88* 87* 84* 85* 88*  CO2  --    < > 35* 38* 36* 36* 33*  GLUCOSE  --    < > 136* 126* 111* 144* 106*  BUN  --    < > 22 18 18 21 21   CREATININE  --    < > 1.47* 1.51* 1.50* 1.86* 1.43*  CALCIUM  --    < > 9.2 9.3 9.2 9.1 9.1  MG 2.2  --   --  1.8  --  2.5*  --    < > = values in this interval not displayed.   GFR: Estimated Creatinine Clearance: 30.7 mL/min (A) (by C-G formula based on SCr of 1.43 mg/dL (H)). Liver Function Tests: Recent Labs  Lab 06/20/18 0453 06/21/18 0500 06/23/18 0541 06/24/18 0555  AST 167* 114* 61* 42*  ALT 305* 260* 171* 129*  ALKPHOS 81 90 93 93  BILITOT 1.5* 1.2 1.4* 1.6*  PROT 5.3* 5.7* 6.3* 6.1*  ALBUMIN 2.7* 2.9* 3.2* 3.2*   No results for input(s): LIPASE, AMYLASE in the last 168 hours. No results for input(s): AMMONIA in the last 168 hours. Coagulation Profile: No results for input(s): INR, PROTIME in the last 168 hours. Cardiac Enzymes: No results for input(s): CKTOTAL, CKMB, CKMBINDEX, TROPONINI in the last 168 hours. BNP (last 3  results) No results for input(s): PROBNP in the last 8760 hours. HbA1C: No results for input(s): HGBA1C in the last 72 hours. CBG: No results for input(s): GLUCAP in the last 168 hours. Lipid Profile: No results for input(s): CHOL, HDL, LDLCALC, TRIG, CHOLHDL, LDLDIRECT in the last 72 hours. Thyroid Function Tests: No results for input(s): TSH, T4TOTAL, FREET4, T3FREE, THYROIDAB in the last 72 hours. Anemia Panel: No results for  input(s): VITAMINB12, FOLATE, FERRITIN, TIBC, IRON, RETICCTPCT in the last 72 hours. Sepsis Labs: No results for input(s): PROCALCITON, LATICACIDVEN in the last 168 hours.  No results found for this or any previous visit (from the past 240 hour(s)).       Radiology Studies: No results found.      Scheduled Meds: . aspirin  81 mg Oral Daily  . azelastine  1 spray Each Nare Daily  . citalopram  20 mg Oral Daily  . digoxin  0.125 mg Oral Daily  . ezetimibe  5 mg Oral Once per day on Mon Thu  . famotidine  20 mg Oral Daily  . ferrous sulfate  325 mg Oral Q breakfast  . fluticasone  2 spray Each Nare Daily  . heparin  5,000 Units Subcutaneous Q8H  . isosorbide mononitrate  30 mg Oral Daily  . loratadine  10 mg Oral Daily  . losartan  12.5 mg Oral BID  . mometasone-formoterol  2 puff Inhalation BID  . nicotine  21 mg Transdermal Daily  . omega-3 acid ethyl esters  2 g Oral Daily  . pantoprazole  40 mg Oral Daily  . rosuvastatin  5 mg Oral QODAY  . sodium chloride flush  10-40 mL Intracatheter Q12H  . sodium chloride flush  3 mL Intravenous Q12H  . spironolactone  25 mg Oral Daily  . torsemide  40 mg Oral Daily   Continuous Infusions: . sodium chloride       LOS: 10 days    Time spent: 35 minutes.     Hosie Poisson, MD [facility] Pager [phone]  If 7PM-7AM, please contact night-coverage [email or web address] Password [facility] 06/26/2018, 4:26 PM

## 2018-06-27 ENCOUNTER — Encounter (HOSPITAL_COMMUNITY): Payer: Medicare Other

## 2018-06-27 DIAGNOSIS — I493 Ventricular premature depolarization: Secondary | ICD-10-CM

## 2018-06-27 LAB — CBC WITH DIFFERENTIAL/PLATELET
Abs Immature Granulocytes: 0 10*3/uL (ref 0.0–0.1)
Basophils Absolute: 0 10*3/uL (ref 0.0–0.1)
Basophils Relative: 1 %
EOS PCT: 1 %
Eosinophils Absolute: 0.1 10*3/uL (ref 0.0–0.7)
HCT: 38.6 % (ref 36.0–46.0)
Hemoglobin: 12 g/dL (ref 12.0–15.0)
Immature Granulocytes: 1 %
Lymphocytes Relative: 20 %
Lymphs Abs: 1.2 10*3/uL (ref 0.7–4.0)
MCH: 28.4 pg (ref 26.0–34.0)
MCHC: 31.1 g/dL (ref 30.0–36.0)
MCV: 91.5 fL (ref 78.0–100.0)
Monocytes Absolute: 0.7 10*3/uL (ref 0.1–1.0)
Monocytes Relative: 12 %
Neutro Abs: 3.9 10*3/uL (ref 1.7–7.7)
Neutrophils Relative %: 65 %
Platelets: 147 10*3/uL — ABNORMAL LOW (ref 150–400)
RBC: 4.22 MIL/uL (ref 3.87–5.11)
RDW: 17.6 % — ABNORMAL HIGH (ref 11.5–15.5)
WBC: 6 10*3/uL (ref 4.0–10.5)

## 2018-06-27 LAB — BASIC METABOLIC PANEL
ANION GAP: 10 (ref 5–15)
BUN: 24 mg/dL — ABNORMAL HIGH (ref 8–23)
CALCIUM: 9.2 mg/dL (ref 8.9–10.3)
CO2: 29 mmol/L (ref 22–32)
CREATININE: 1.33 mg/dL — AB (ref 0.44–1.00)
Chloride: 90 mmol/L — ABNORMAL LOW (ref 98–111)
GFR calc Af Amer: 46 mL/min — ABNORMAL LOW (ref >60)
GFR, EST NON AFRICAN AMERICAN: 40 mL/min — AB (ref >60)
GLUCOSE: 109 mg/dL — AB (ref 70–99)
Potassium: 4.1 mmol/L (ref 3.5–5.1)
Sodium: 129 mmol/L — ABNORMAL LOW (ref 135–145)

## 2018-06-27 LAB — COOXEMETRY PANEL
CARBOXYHEMOGLOBIN: 1.9 % — AB (ref 0.5–1.5)
Methemoglobin: 0.9 % (ref 0.0–1.5)
O2 SAT: 66.3 %
TOTAL HEMOGLOBIN: 12.5 g/dL (ref 12.0–16.0)

## 2018-06-27 MED ORDER — MEXILETINE HCL 200 MG PO CAPS
200.0000 mg | ORAL_CAPSULE | Freq: Two times a day (BID) | ORAL | Status: DC
  Administered 2018-06-27 – 2018-06-28 (×3): 200 mg via ORAL
  Filled 2018-06-27 (×3): qty 1

## 2018-06-27 NOTE — Progress Notes (Signed)
#   9.  S/W MONICA @ [facility] CHOICE PDP RX      # [phone]    MEXILETINE 200 MG BID  COVER- YES  CO-PAY- $ 66.93  TIER- NO  PRIOR APPROVAL- NO   NO DEDUCTIBLE   PREFERRED PHARMACY ; YES   [facility]

## 2018-06-27 NOTE — Progress Notes (Signed)
CARDIAC REHAB PHASE I   PRE:  Rate/Rhythm: 87 SR with PVCs    BP: sitting 103/52    SaO2: 97 RA  MODE:  Ambulation: 430 ft   POST:  Rate/Rhythm: 87 SR with PVCs    BP: sitting 107/56     SaO2: 97 RA  Pt c/o slight lightheadedness the entire walk today. Did not limit her, steady. C/o slight SOB at half way. Flat affect, seems depressed. Increased distance. Return to EOB (declined recliner).  [place], ACSM 06/27/2018 9:04 AM

## 2018-06-27 NOTE — Care Management Important Message (Signed)
Important Message  Patient Details  Name: [name] MRN: [id number] Date of Birth: 04-Feb-1951   Medicare Important Message Given:  Yes    Barb Merino Trestan Vahle 06/27/2018, 3:43 PM

## 2018-06-27 NOTE — Progress Notes (Signed)
Patient ID: [name], female   DOB: 08-14-1951, 67 y.o.   MRN: [id number]     Advanced Heart Failure Rounding Note  PCP-Cardiologist: Jenkins Rouge, MD   Subjective:    Milrinone stopped yesterday. Coox 66%. CVP 8. Feels ok. A bit weak but no SOB, orthopnea or PND.   Weight down 1 pound on oral torsemide. Creatinine improved.    Cath 06/22/18  showed nonobstructive CAD, preserved CO (on milrinone), and right and left elevated filling pressures as below.   LHC 06/22/18: Left Main  Calcified and relatively short left main with about 40% stenosis.  Left Anterior Descending  40% mid LAD stenosis.  Left Circumflex  50% stenosis proximal LCx just prior to stent. Patient stent proximal LCx.  Right Coronary Artery  40% mid RCA stenosis.   RHC 06/22/18: RA mean 13 RV 48/13 PA 49/16, mean 27 PCWP mean 19 LV 110/27 AO 108/57 Oxygen saturations: PA 68% AO 98% Cardiac Output (Fick) 5.04  Cardiac Index (Fick) 3.16 PVR 1.59 WU Cardiac Output (Thermo) 3.68 Cardiac Index (Thermo) 2.31  Objective:   Weight Range: 114 lb 9.6 oz (52 kg) Body mass index is 19.98 kg/m.   Vital Signs:   Temp:  [97.7 F (36.5 C)-98.2 F (36.8 C)] 97.9 F (36.6 C) (07/22 0700) Pulse Rate:  [79-94] 79 (07/22 0700) Resp:  [15-20] 19 (07/22 0700) BP: (94-105)/(43-60) 100/60 (07/22 0700) SpO2:  [94 %-98 %] 98 % (07/22 0806) Weight:  [114 lb 9.6 oz (52 kg)] 114 lb 9.6 oz (52 kg) (07/22 0500) Last BM Date: 06/23/18  Weight change: Filed Weights   06/25/18 0552 06/26/18 0607 06/27/18 0500  Weight: 113 lb 4.8 oz (51.4 kg) 115 lb 4.8 oz (52.3 kg) 114 lb 9.6 oz (52 kg)    Intake/Output:   Intake/Output Summary (Last 24 hours) at 06/27/2018 0834 Last data filed at 06/26/2018 2350 Gross per 24 hour  Intake 1010 ml  Output 300 ml  Net 710 ml      Physical Exam   CVP 8 General: Sitting on side of bed eating breakfast  No resp difficulty HEENT: normal Neck: supple. JVP 8 Carotids 2+ bilat; no  bruits. No lymphadenopathy or thryomegaly appreciated. Cor: PMI nondisplaced. Regular rate & rhythm. With ectopy   No rubs, gallops or murmurs. Lungs: clear with decreased BS throughout  No wheeze Abdomen: soft, nontender, nondistended. No hepatosplenomegaly. No bruits or masses. Good bowel sounds. Extremities: no cyanosis, clubbing, rash, edema Neuro: alert & orientedx3, cranial nerves grossly intact. moves all 4 extremities w/o difficulty. Affect pleasant   Telemetry   NSR 80-90s with frequent PVCs (up to 41/per min - average about 15-22 per min), Personally reviewed.   EKG    No new tracings.   Labs    CBC Recent Labs    06/26/18 0519 06/27/18 0349  WBC 6.2 6.0  NEUTROABS 4.0 3.9  HGB 12.1 12.0  HCT 38.9 38.6  MCV 91.3 91.5  PLT 145* 527*   Basic Metabolic Panel Recent Labs    06/25/18 0457 06/26/18 0519 06/27/18 0349  NA 131* 130* 129*  K 4.0 3.8 4.1  CL 85* 88* 90*  CO2 36* 33* 29  GLUCOSE 144* 106* 109*  BUN 21 21 24*  CREATININE 1.86* 1.43* 1.33*  CALCIUM 9.1 9.1 9.2  MG 2.5*  --   --    Liver Function Tests No results for input(s): AST, ALT, ALKPHOS, BILITOT, PROT, ALBUMIN in the last 72 hours. No results for input(s): LIPASE,  AMYLASE in the last 72 hours. Cardiac Enzymes No results for input(s): CKTOTAL, CKMB, CKMBINDEX, TROPONINI in the last 72 hours.  BNP: BNP (last 3 results) Recent Labs    06/04/18 0132 06/15/18 1617 06/18/18 0601  BNP 3,135.7* 7,858.8* 2,926.1*    ProBNP (last 3 results) No results for input(s): PROBNP in the last 8760 hours.   D-Dimer No results for input(s): DDIMER in the last 72 hours. Hemoglobin A1C No results for input(s): HGBA1C in the last 72 hours. Fasting Lipid Panel No results for input(s): CHOL, HDL, LDLCALC, TRIG, CHOLHDL, LDLDIRECT in the last 72 hours. Thyroid Function Tests No results for input(s): TSH, T4TOTAL, T3FREE, THYROIDAB in the last 72 hours.  Invalid input(s): FREET3  Other  results:   Imaging    No results found.   Medications:     Scheduled Medications: . aspirin  81 mg Oral Daily  . azelastine  1 spray Each Nare Daily  . citalopram  20 mg Oral Daily  . digoxin  0.125 mg Oral Daily  . ezetimibe  5 mg Oral Once per day on Mon Thu  . famotidine  20 mg Oral Daily  . ferrous sulfate  325 mg Oral Q breakfast  . fluticasone  2 spray Each Nare Daily  . heparin  5,000 Units Subcutaneous Q8H  . isosorbide mononitrate  30 mg Oral Daily  . loratadine  10 mg Oral Daily  . losartan  12.5 mg Oral BID  . mexiletine  200 mg Oral Q12H  . mometasone-formoterol  2 puff Inhalation BID  . nicotine  21 mg Transdermal Daily  . omega-3 acid ethyl esters  2 g Oral Daily  . pantoprazole  40 mg Oral Daily  . rosuvastatin  5 mg Oral QODAY  . sodium chloride flush  10-40 mL Intracatheter Q12H  . sodium chloride flush  3 mL Intravenous Q12H  . spironolactone  25 mg Oral Daily  . torsemide  40 mg Oral Daily    Infusions: . sodium chloride      PRN Medications: sodium chloride, acetaminophen, hydrALAZINE, LORazepam, morphine injection, nitroGLYCERIN, ondansetron (ZOFRAN) IV, polyethylene glycol, sodium chloride flush, sodium chloride flush, zolpidem    Patient Profile   Patient has long history of CAD with prior anterior MI. Last cath in 8/15 with nonobstructive disease. She has ischemic cardiomyopathy with extensive scarring by 12/09 cardiac MRI.  Most recent echo was done this admission, showing EF 15% with moderate LV dilation and mildly decreased RV systolic function.  She additionally is an active smoker with COPD.  Baseline creatinine around 1.8, CKD stage 3.   Admitted with volume overload with poor response to lasix.   Assessment/Plan   1. Acute on chronic systolic CHF: Ischemic cardiomyopathy, echo in 7/19 with EF 15%, mildly decreased RV systolic function, moderate MR, moderate TR.  Symptoms have been worsening over a number of weeks, had recent  admission at the end of June but probably was not fully diuresed prior to discharge.  [place] 06/22/18 showed elevated filling pressures on right and left with preserved CO (on milrinone).  She diuresed well on IV Lasix and milrinone, now off IV Lasix and milrinone  - Milrinone stopped yesterday. Coox 66%. CVP 8.  - Weight stable on po torsemide 40 mg daily for home (was on Lasix 80 mg daily prior to admission).  - Will watch one more day off milrinone. - Continue spironolactone 25 daily.  - Continue digoxin 0.125 mg daily  - Continue losartan 12.5 mg bid.   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 PVC burden > 20%. Will severe COPD. start mexilitene 200 bid. Discussed dosing with PharmD personally. - Will likely not be candidate for advanced therapies with size, age and active smoking 2. AKI on CKD stage 3: As above, concerned for cardiorenal syndrome with low output HF.  Creatinine > 2 initially, improved with diuresis/milrinone, now down to 1.3.   3. 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  Workup for gallbladder disease unremarkable.  Improved with diuresis.  4. COPD: Emphysema on CT chest.  Needs to quit smoking. I reinforced this again today 5. CAD: Nonobstructive disease on last cath in 8/15. LHC 06/22/18 with nonobstructive CAD.  - Continue ASA 81 daily.  - Continue Crestor 5 mg qod (cannot tolerate higher dose).  6. PAD: Chronic claudication with left SFA occluded. No change  7. Carotid stenosis: Moderate RICA stenosis on 9/18 dopplers. No change.  8. Hypokalemia: Stable on spironolactone. K 4.1 today 9. Hyponatremia  - Limit free water  Stable off milrinone. Will watch one more day. Start mexilitene for possibility of PVC cardiomyopathy.   Length of Stay: 60  Glori Bickers, MD  06/27/2018, 8:34 AM  Advanced Heart Failure Team Pager [phone] (M-F; 7a - 4p)  Please contact [facility] for night-coverage after  hours (4p -7a ) and weekends on [email or web address]

## 2018-06-27 NOTE — Progress Notes (Signed)
PROGRESS NOTE    [name]  DQQ:[id number] DOB: [birth year] DOA: 06/15/2018 PCP: [facility]    Brief Narrative: 67 year old female with a history of hypertension, hyperlipidemia, asthma, GERD, depression, CAD, MI, CHF with EF 20%, tobacco abuse, CKD stage III, ICD placement, iron deficiency anemia came to hospital with nausea vomiting abdominal pain for the past 3 days. Patient was seen by general surgery and also underwent HIDA scan which was negative for biliary disease. Her nausea, vomiting and abdominal pain have improved. Her liver function tests are improving. She is walking in the hallway. She reports occasional headache.    Assessment & Plan:   Principal Problem:   Abdominal pain Active Problems:   Essential hypertension   Tobacco abuse   Implantable cardioverter-defibrillator (ICD) in situ   Hyperlipidemia   Acute renal failure superimposed on stage 3 chronic kidney disease (HCC)   Asthma   CAD (coronary artery disease)   Acute on chronic systolic CHF (congestive heart failure) (HCC)   Hyponatremia   Hypokalemia   Rectal bleeding   Elevated troponin   Iron deficiency anemia   Abnormal LFTs   AKI (acute kidney injury) (HCC)   Intractable Nausea, vomiting and abdominal  pain: Suspect from hepatic congestion from CHF, CT  abdomen and pelvis is negative.  HIDA IS negative. Surgery consulted and recommendations given.  Symptoms have improved with improvement in CHF.    Acute systolic CHF:  Echocardiogram showed LVEF of 20%. Underwent LHC AND RHC, mild non obstructive CAD, and fluid overloaded.  Cardiology on board and managing the fluid overload.  She was initially started on milrinone gtt and lasix gtt, with Prn metolazone. Milrinone and IV lasix are discontinued and she is on oral torsemide 40 mg daily along with Spironolactone added at 25 mg daily. Resume digoxin 0.125 mg daily.  Losartan added at 12.5 mg daily as her creatinine  continues to improve.  Her breathing has improved. No sob or chest pain.  She is on RA WITH good oxygen saturations.     Abnormal LFT'S  Improving.  Possibly from congestion.  No nausea or vomiting.    Acute on Stage 3 CKD  baseline creatinine at 1.2 , creatinine improved to 1.3.    CAD No further work up at this time. No chest pain or sob.    Rectal bleeding likely from hemorrhoids. Hemoglobin stable around 10.    Anemia of chronic disease/ Iron deficiency anemia:  Iron supplementation.  Hemoglobin stable around 11.   Hyponatremia:  129 today decreased from 130 .    COPD:  No wheezing heard. Currently on Dulera.    Peripheral Arterial Disease:  Stable. On aspirin   Mild Thrombocytopenia:  Unclear etiology.  No signs of bleeding.    Hyperlipidemia:  Resume zetia and crestor.   Tobacco Use:  On Nicotine patch.    GERD Stable on protonix.   Hypertension  Well controlled.  Resume Imdur.    DVT prophylaxis:  Heparin sq Code Status:  Full code.  Family Communication: none at bedside.  Disposition Plan: possible d/c in am.   Consultants:   CARDIOLOGY.    Procedures:   Echocardiogram  LHC  RHC   Antimicrobials: NONE.    Subjective: No chest pain, sob on walking. No nausea, vomiting or abdominal pain.   Objective: Vitals:   06/27/18 0344 06/27/18 0500 06/27/18 0700 06/27/18 0806  BP: (!) 105/55  100/60   Pulse: 83  79   Resp: 18  19   Temp: 97.7 F (36.5 C)  97.9 F (36.6 C)   TempSrc: Oral  Oral   SpO2: 96%   98%  Weight:  52 kg (114 lb 9.6 oz)    Height:        Intake/Output Summary (Last 24 hours) at 06/27/2018 0905 Last data filed at 06/27/2018 0803 Gross per 24 hour  Intake 1370 ml  Output 300 ml  Net 1070 ml   Filed Weights   06/25/18 0552 06/26/18 0607 06/27/18 0500  Weight: 51.4 kg (113 lb 4.8 oz) 52.3 kg (115 lb 4.8 oz) 52 kg (114 lb 9.6 oz)    Examination:  General exam: comfortable on RA, walking in the  hallway.  Respiratory system: clear to auscultation, no wheezing or rhonchi.  Cardiovascular system: s1s2, RRR, no pedal edema.  Gastrointestinal system: Abdomen is soft NT ND BS+ Central nervous system: Alert and oriented. Non focal.  ExtremitieS: no cyanosis or clubbing. No pedal edema.  Skin: No rashes, lesions or ulcers Psychiatry:  Mood & affect appropriate.     Data Reviewed: I have personally reviewed following labs and imaging studies  CBC: Recent Labs  Lab 06/23/18 0541 06/24/18 0555 06/25/18 0457 06/26/18 0519 06/27/18 0349  WBC 5.5 6.1 5.7 6.2 6.0  NEUTROABS 4.0 4.2 3.8 4.0 3.9  HGB 10.9* 11.4* 11.5* 12.1 12.0  HCT 35.8* 36.4 37.6 38.9 38.6  MCV 93.0 91.7 91.7 91.3 91.5  PLT 118* 123* 130* 145* 330*   Basic Metabolic Panel: Recent Labs  Lab 06/22/18 0500  06/23/18 0541 06/24/18 0555 06/25/18 0457 06/26/18 0519 06/27/18 0349  NA  --    < > 136 133* 131* 130* 129*  K  --    < > 3.4* 3.5 4.0 3.8 4.1  CL  --    < > 87* 84* 85* 88* 90*  CO2  --    < > 38* 36* 36* 33* 29  GLUCOSE  --    < > 126* 111* 144* 106* 109*  BUN  --    < > 18 18 21 21  24*  CREATININE  --    < > 1.51* 1.50* 1.86* 1.43* 1.33*  CALCIUM  --    < > 9.3 9.2 9.1 9.1 9.2  MG 2.2  --  1.8  --  2.5*  --   --    < > = values in this interval not displayed.   GFR: Estimated Creatinine Clearance: 32.8 mL/min (A) (by C-G formula based on SCr of 1.33 mg/dL (H)). Liver Function Tests: Recent Labs  Lab 06/21/18 0500 06/23/18 0541 06/24/18 0555  AST 114* 61* 42*  ALT 260* 171* 129*  ALKPHOS 90 93 93  BILITOT 1.2 1.4* 1.6*  PROT 5.7* 6.3* 6.1*  ALBUMIN 2.9* 3.2* 3.2*   No results for input(s): LIPASE, AMYLASE in the last 168 hours. No results for input(s): AMMONIA in the last 168 hours. Coagulation Profile: No results for input(s): INR, PROTIME in the last 168 hours. Cardiac Enzymes: No results for input(s): CKTOTAL, CKMB, CKMBINDEX, TROPONINI in the last 168 hours. BNP (last 3  results) No results for input(s): PROBNP in the last 8760 hours. HbA1C: No results for input(s): HGBA1C in the last 72 hours. CBG: No results for input(s): GLUCAP in the last 168 hours. Lipid Profile: No results for input(s): CHOL, HDL, LDLCALC, TRIG, CHOLHDL, LDLDIRECT in the last 72 hours. Thyroid Function Tests: No results for input(s): TSH, T4TOTAL, FREET4, T3FREE, THYROIDAB in the last 72 hours. Anemia  Panel: No results for input(s): VITAMINB12, FOLATE, FERRITIN, TIBC, IRON, RETICCTPCT in the last 72 hours. Sepsis Labs: No results for input(s): PROCALCITON, LATICACIDVEN in the last 168 hours.  No results found for this or any previous visit (from the past 240 hour(s)).       Radiology Studies: No results found.      Scheduled Meds: . aspirin  81 mg Oral Daily  . azelastine  1 spray Each Nare Daily  . citalopram  20 mg Oral Daily  . digoxin  0.125 mg Oral Daily  . ezetimibe  5 mg Oral Once per day on Mon Thu  . famotidine  20 mg Oral Daily  . ferrous sulfate  325 mg Oral Q breakfast  . fluticasone  2 spray Each Nare Daily  . heparin  5,000 Units Subcutaneous Q8H  . isosorbide mononitrate  30 mg Oral Daily  . loratadine  10 mg Oral Daily  . losartan  12.5 mg Oral BID  . mexiletine  200 mg Oral Q12H  . mometasone-formoterol  2 puff Inhalation BID  . nicotine  21 mg Transdermal Daily  . omega-3 acid ethyl esters  2 g Oral Daily  . pantoprazole  40 mg Oral Daily  . rosuvastatin  5 mg Oral QODAY  . sodium chloride flush  10-40 mL Intracatheter Q12H  . sodium chloride flush  3 mL Intravenous Q12H  . spironolactone  25 mg Oral Daily  . torsemide  40 mg Oral Daily   Continuous Infusions: . sodium chloride       LOS: 11 days    Time spent: 25 minutes.     Hosie Poisson, MD [facility] Pager [phone]  If 7PM-7AM, please contact night-coverage [email or web address] Password [facility] 06/27/2018, 9:05 AM

## 2018-06-28 LAB — CBC WITH DIFFERENTIAL/PLATELET
ABS IMMATURE GRANULOCYTES: 0 10*3/uL (ref 0.0–0.1)
Basophils Absolute: 0 10*3/uL (ref 0.0–0.1)
Basophils Relative: 1 %
Eosinophils Absolute: 0.1 10*3/uL (ref 0.0–0.7)
Eosinophils Relative: 1 %
HEMATOCRIT: 36.8 % (ref 36.0–46.0)
HEMOGLOBIN: 11.3 g/dL — AB (ref 12.0–15.0)
Immature Granulocytes: 1 %
LYMPHS ABS: 1.2 10*3/uL (ref 0.7–4.0)
LYMPHS PCT: 21 %
MCH: 28.1 pg (ref 26.0–34.0)
MCHC: 30.7 g/dL (ref 30.0–36.0)
MCV: 91.5 fL (ref 78.0–100.0)
Monocytes Absolute: 0.7 10*3/uL (ref 0.1–1.0)
Monocytes Relative: 12 %
Neutro Abs: 3.7 10*3/uL (ref 1.7–7.7)
Neutrophils Relative %: 64 %
Platelets: 149 10*3/uL — ABNORMAL LOW (ref 150–400)
RBC: 4.02 MIL/uL (ref 3.87–5.11)
RDW: 17.6 % — ABNORMAL HIGH (ref 11.5–15.5)
WBC: 5.8 10*3/uL (ref 4.0–10.5)

## 2018-06-28 LAB — BASIC METABOLIC PANEL
Anion gap: 8 (ref 5–15)
BUN: 29 mg/dL — ABNORMAL HIGH (ref 8–23)
CHLORIDE: 92 mmol/L — AB (ref 98–111)
CO2: 29 mmol/L (ref 22–32)
Calcium: 9.4 mg/dL (ref 8.9–10.3)
Creatinine, Ser: 1.38 mg/dL — ABNORMAL HIGH (ref 0.44–1.00)
GFR calc Af Amer: 44 mL/min — ABNORMAL LOW (ref >60)
GFR calc non Af Amer: 38 mL/min — ABNORMAL LOW (ref >60)
GLUCOSE: 107 mg/dL — AB (ref 70–99)
POTASSIUM: 4.5 mmol/L (ref 3.5–5.1)
SODIUM: 129 mmol/L — AB (ref 135–145)

## 2018-06-28 LAB — COOXEMETRY PANEL
Carboxyhemoglobin: 1.8 % — ABNORMAL HIGH (ref 0.5–1.5)
Methemoglobin: 0.9 % (ref 0.0–1.5)
O2 Saturation: 58.6 %
TOTAL HEMOGLOBIN: 12 g/dL (ref 12.0–16.0)

## 2018-06-28 LAB — DIGOXIN LEVEL: Digoxin Level: 1 ng/mL (ref 0.8–2.0)

## 2018-06-28 MED ORDER — MEXILETINE HCL 200 MG PO CAPS: 200.0000 mg | ORAL_CAPSULE | Freq: Two times a day (BID) | ORAL | 0 refills | Status: DC

## 2018-06-28 MED ORDER — DIGOXIN 125 MCG PO TABS: 0.0625 mg | ORAL_TABLET | Freq: Every day | ORAL | Status: DC

## 2018-06-28 MED ORDER — LOSARTAN POTASSIUM 25 MG PO TABS: 12.5000 mg | ORAL_TABLET | Freq: Two times a day (BID) | ORAL | 0 refills | Status: DC

## 2018-06-28 MED ORDER — DIGOXIN 62.5 MCG PO TABS: 0.0625 mg | ORAL_TABLET | Freq: Every day | ORAL | 0 refills | Status: DC

## 2018-06-28 MED ORDER — TORSEMIDE 20 MG PO TABS: 40.0000 mg | ORAL_TABLET | Freq: Every day | ORAL | 0 refills | Status: DC

## 2018-06-28 MED ORDER — SPIRONOLACTONE 25 MG PO TABS: 25.0000 mg | ORAL_TABLET | Freq: Every day | ORAL | 0 refills | Status: DC

## 2018-06-28 NOTE — Progress Notes (Signed)
CARDIAC REHAB PHASE I   PRE:  Rate/Rhythm: 90 SR    BP: sitting 103/57    SaO2:   MODE:  Ambulation: 430 ft   POST:  Rate/Rhythm: 87 SR with occ PVC    BP: sitting 103/57     SaO2: 95 RA  Pt still c/o lightheadedness and SOB today but seemed to do ok. Feels ready to d/c. Less PVCs today. Reviewed low sodium, daily wts, smoking cessation, when to call clinic. Good reception. She is going to work on smoking but does not sound confident. Gave her tips. She has resources and fake cigarette at home (although it did not help her previously).  [phone]  [place], ACSM 06/28/2018 11:24 AM

## 2018-06-28 NOTE — Progress Notes (Signed)
Discharged home by whelchair accompanied by friend . Discharged  Instructions given to pt. Belongings taken home.

## 2018-06-28 NOTE — Progress Notes (Addendum)
Patient ID: [name], female   DOB: [birth year], 67 y.o.   MRN: [id number]     Advanced Heart Failure Rounding Note  PCP-Cardiologist: Jenkins Rouge, MD   Subjective:   Yesterday mexiletine started for high PVC burden.  PVCs improved but still with 5-10 per minute.    Wants to go home. Denies SOB. Feels weak at times but able to ambulate room.   Cath 06/22/18  showed nonobstructive CAD, preserved CO (on milrinone), and right and left elevated filling pressures as below.   LHC 06/22/18: Left Main  Calcified and relatively short left main with about 40% stenosis.  Left Anterior Descending  40% mid LAD stenosis.  Left Circumflex  50% stenosis proximal LCx just prior to stent. Patient stent proximal LCx.  Right Coronary Artery  40% mid RCA stenosis.   RHC 06/22/18: RA mean 13 RV 48/13 PA 49/16, mean 27 PCWP mean 19 LV 110/27 AO 108/57 Oxygen saturations: PA 68% AO 98% Cardiac Output (Fick) 5.04  Cardiac Index (Fick) 3.16 PVR 1.59 WU Cardiac Output (Thermo) 3.68 Cardiac Index (Thermo) 2.31  Objective:   Weight Range: 115 lb 9.6 oz (52.4 kg) Body mass index is 20.16 kg/m.   Vital Signs:   Temp:  [97.1 F (36.2 C)-98.2 F (36.8 C)] 97.9 F (36.6 C) (07/23 0748) Pulse Rate:  [80-90] 85 (07/23 0914) Resp:  [18-25] 18 (07/23 0748) BP: (96-106)/(51-74) 97/54 (07/23 0748) SpO2:  [97 %-99 %] 97 % (07/23 0757) Weight:  [115 lb 9.6 oz (52.4 kg)] 115 lb 9.6 oz (52.4 kg) (07/23 0357) Last BM Date: 06/27/18  Weight change: Filed Weights   06/26/18 0607 06/27/18 0500 06/28/18 0357  Weight: 115 lb 4.8 oz (52.3 kg) 114 lb 9.6 oz (52 kg) 115 lb 9.6 oz (52.4 kg)    Intake/Output:   Intake/Output Summary (Last 24 hours) at 06/28/2018 0919 Last data filed at 06/28/2018 0500 Gross per 24 hour  Intake 1620 ml  Output 2400 ml  Net -780 ml      Physical Exam   CVP 5 General:  Well appearing. No resp difficulty. Walking in the room  HEENT: normal Neck: supple. JVP 5-6 .  Carotids 2+ bilat; no bruits. No lymphadenopathy or thryomegaly appreciated. Cor: PMI laterally displaced. Regular rate & rhythm. No rubs, gallops or murmurs. Occasional ectopy Lungs: clear no wheeze  Abdomen: soft, nontender, nondistended. No hepatosplenomegaly. No bruits or masses. Good bowel sounds. Extremities: no cyanosis, clubbing, rash, edema Neuro: alert & orientedx3, cranial nerves grossly intact. moves all 4 extremities w/o difficulty. Affect pleasant   Telemetry  NSR 70-80s PVCs 5-10 per hour.   EKG    No new tracings.   Labs    CBC Recent Labs    06/27/18 0349 06/28/18 0402  WBC 6.0 5.8  NEUTROABS 3.9 3.7  HGB 12.0 11.3*  HCT 38.6 36.8  MCV 91.5 91.5  PLT 147* 527*   Basic Metabolic Panel Recent Labs    06/27/18 0349 06/28/18 0402  NA 129* 129*  K 4.1 4.5  CL 90* 92*  CO2 29 29  GLUCOSE 109* 107*  BUN 24* 29*  CREATININE 1.33* 1.38*  CALCIUM 9.2 9.4   Liver Function Tests No results for input(s): AST, ALT, ALKPHOS, BILITOT, PROT, ALBUMIN in the last 72 hours. No results for input(s): LIPASE, AMYLASE in the last 72 hours. Cardiac Enzymes No results for input(s): CKTOTAL, CKMB, CKMBINDEX, TROPONINI in the last 72 hours.  BNP: BNP (last 3 results) Recent Labs  06/04/18 0132 06/15/18 1617 06/18/18 0601  BNP 3,135.7* 7,048.8* 2,926.1*    ProBNP (last 3 results) No results for input(s): PROBNP in the last 8760 hours.   D-Dimer No results for input(s): DDIMER in the last 72 hours. Hemoglobin A1C No results for input(s): HGBA1C in the last 72 hours. Fasting Lipid Panel No results for input(s): CHOL, HDL, LDLCALC, TRIG, CHOLHDL, LDLDIRECT in the last 72 hours. Thyroid Function Tests No results for input(s): TSH, T4TOTAL, T3FREE, THYROIDAB in the last 72 hours.  Invalid input(s): FREET3  Other results:   Imaging    No results found.   Medications:     Scheduled Medications: . aspirin  81 mg Oral Daily  . azelastine  1 spray  Each Nare Daily  . citalopram  20 mg Oral Daily  . digoxin  0.125 mg Oral Daily  . ezetimibe  5 mg Oral Once per day on Mon Thu  . famotidine  20 mg Oral Daily  . ferrous sulfate  325 mg Oral Q breakfast  . fluticasone  2 spray Each Nare Daily  . heparin  5,000 Units Subcutaneous Q8H  . isosorbide mononitrate  30 mg Oral Daily  . loratadine  10 mg Oral Daily  . losartan  12.5 mg Oral BID  . mexiletine  200 mg Oral Q12H  . mometasone-formoterol  2 puff Inhalation BID  . nicotine  21 mg Transdermal Daily  . omega-3 acid ethyl esters  2 g Oral Daily  . pantoprazole  40 mg Oral Daily  . rosuvastatin  5 mg Oral QODAY  . sodium chloride flush  10-40 mL Intracatheter Q12H  . sodium chloride flush  3 mL Intravenous Q12H  . spironolactone  25 mg Oral Daily  . torsemide  40 mg Oral Daily    Infusions: . sodium chloride      PRN Medications: sodium chloride, acetaminophen, hydrALAZINE, LORazepam, morphine injection, nitroGLYCERIN, ondansetron (ZOFRAN) IV, polyethylene glycol, sodium chloride flush, sodium chloride flush, zolpidem    Patient Profile   Patient has long history of CAD with prior anterior MI. Last cath in 8/15 with nonobstructive disease. She has ischemic cardiomyopathy with extensive scarring by 12/09 cardiac MRI.  Most recent echo was done this admission, showing EF 15% with moderate LV dilation and mildly decreased RV systolic function.  She additionally is an active smoker with COPD.  Baseline creatinine around 1.8, CKD stage 3.   Admitted with volume overload with poor response to lasix.   Assessment/Plan   1. Acute on chronic systolic CHF: Ischemic cardiomyopathy, echo in 7/19 with EF 15%, mildly decreased RV systolic function, moderate MR, moderate TR.  Symptoms have been worsening over a number of weeks, had recent admission at the end of June but probably was not fully diuresed prior to discharge.  [place] 06/22/18 showed elevated filling pressures on right and left with  preserved CO (on milrinone).  She diuresed well on IV Lasix and milrinone, now off IV Lasix and milrinone  - CO-OX 59%. Stable off milrinone. - Volume status stable. Continue torsemide 40 mg daily.   - Continue spironolactone 25 daily.  - Dig level 1. Cut back dig to 0.0625 mg daily.   - Continue losartan 12.5 mg bid.   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 PVC burden > 20%. Will severe COPD.  -Continue mexilitene 200 bid. Discussed dosing with PharmD personally. - Will likely not be candidate for advanced therapies with size, age and active  smoking 2. AKI on CKD stage 3: As above, concerned for cardiorenal syndrome with low output HF.  Creatinine > 2 initially, improved with diuresis/milrinone.  Creatinine stable 1.4    3. 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  Workup for gallbladder disease unremarkable.  Improved with diuresis.  4. COPD: Emphysema on CT chest.  Needs to quit smoking. I reinforced this again today 5. CAD: Nonobstructive disease on last cath in 8/15. LHC 06/22/18 with nonobstructive CAD.  - Continue ASA 81 daily.  - Continue Crestor 5 mg qod (cannot tolerate higher dose).  6. PAD: Chronic claudication with left SFA occluded. No change  7. Carotid stenosis: Moderate RICA stenosis on 9/18 dopplers. No change.  8. Hypokalemia: Resolved.  9. Hyponatremia  Sodium 129.    Ok to go home. HF Follow up set up 07/06/2018  Home Medications.  Digoxin 0.0625 mg daily Torsemide 40 mg daily.  Spironolactone 25 mg daily Losartan 12.5 mg twice a d ay Mexilitene 200 mg twice a day  Asa 81 mg daily Crestor 5 mg every other day.   Length of Stay: [place], NP  06/28/2018, 9:19 AM  Advanced Heart Failure Team Pager [phone] (M-F; 7a - 4p)  Please contact [facility] for night-coverage after hours (4p -7a ) and weekends on [email or web address]  Patient seen and examined with Darrick Grinder, NP. We discussed all aspects of the  encounter. I agree with the assessment and plan as stated above.   Overall improved. Co-ox stable off milrinone. PVC burden improving on mexilitene. [place] for d/c today on above medications. Will have close f/u in HF Clinic.   Heart failure team will sign off as of 06/28/18. Appreciate [facility].   Glori Bickers, MD  9:53 AM

## 2018-06-29 ENCOUNTER — Other Ambulatory Visit (HOSPITAL_COMMUNITY): Payer: Self-pay | Admitting: *Deleted

## 2018-06-29 MED ORDER — DIGOXIN 125 MCG PO TABS: 0.0625 mg | ORAL_TABLET | Freq: Every day | ORAL | 3 refills | Status: DC

## 2018-07-03 NOTE — Discharge Summary (Signed)
Physician Discharge Summary  LAMONT GLASSCOCK FIE:[id number] DOB: Jan 26, 1951 DOA: 06/15/2018  PCP: [place], [place] date: 06/15/2018 Discharge date: 06/28/2018  Admitted From: Home.  Disposition:  Home.   Recommendations for Outpatient Follow-up:  1. Follow up with PCP in 1-2 weeks 2. Please obtain BMP/CBC in one week Please follow up with heart failure team as recommended.   Discharge Condition:stable.  CODE STATUS: FULL CODE. Diet recommendation: Heart Healthy   Brief/Interim Summary: 67 year old female with a history of hypertension, hyperlipidemia, asthma, GERD, depression, CAD, MI, CHF with EF 20%, tobacco abuse, CKD stage III, ICD placement, iron deficiency anemia came to hospital with nausea vomiting abdominal pain for the past 3 days. Patient was seen by general surgery and also underwent HIDA scan which was negative for biliary disease. Her nausea, vomiting and abdominal pain have improved. Her liver function tests are improving. She is walking in the hallway. She reports occasional headache.     Discharge Diagnoses:  Principal Problem:   Abdominal pain Active Problems:   Essential hypertension   Tobacco abuse   Implantable cardioverter-defibrillator (ICD) in situ   Hyperlipidemia   Acute renal failure superimposed on stage 3 chronic kidney disease (HCC)   Asthma   CAD (coronary artery disease)   Acute on chronic systolic CHF (congestive heart failure) (HCC)   Hyponatremia   Hypokalemia   Rectal bleeding   Elevated troponin   Iron deficiency anemia   Abnormal LFTs   AKI (acute kidney injury) (HCC)   Intractable Nausea, vomiting and abdominal  pain: Suspect from hepatic congestion from CHF, CT  abdomen and pelvis is negative.  HIDA IS negative. Surgery consulted and recommendations given.  Symptoms have improved with improvement in CHF.    Acute systolic CHF:  Echocardiogram showed LVEF of 20%. Underwent LHC AND RHC, mild non  obstructive CAD, and fluid overloaded.  Cardiology on board and managing the fluid overload.  She was initially started on milrinone gtt and lasix gtt, with Prn metolazone. Milrinone and IV lasix are discontinued and she is on oral torsemide 40 mg daily along with Spironolactone added at 25 mg daily. Resume digoxin 0.125 mg daily.  Losartan added at 12.5 mg daily as her creatinine continues to improve.  Her breathing has improved. No sob or chest pain.  She is on RA WITH good oxygen saturations.     Abnormal LFT'S  Improving.  Possibly from congestion.  No nausea or vomiting.    Acute on Stage 3 CKD baseline creatinine at 1.2 , creatinine improved to 1.3.    CAD No further work up at this time. No chest pain or sob.   Rectal bleeding likely from hemorrhoids. Hemoglobin stable around 10.    Anemia of chronic disease/ Iron deficiency anemia:  Iron supplementation.  Hemoglobin stable around 11.   Hyponatremia:  129 today.    COPD:  No wheezing heard. Currently on Dulera.    Peripheral Arterial Disease:  Stable. On aspirin   Mild Thrombocytopenia:  Unclear etiology.  No signs of bleeding.    Hyperlipidemia:  Resume zetia and crestor.   Tobacco Use:  On Nicotine patch.    GERD Stable on protonix.   Hypertension  Well controlled.  Resume Imdur.      Discharge Instructions  Discharge Instructions    (HEART FAILURE PATIENTS) Call MD:  Anytime you have any of the following symptoms: 1) 3 pound weight gain in 24 hours or 5 pounds in 1 week 2) shortness of  breath, with or without a dry hacking cough 3) swelling in the hands, feet or stomach 4) if you have to sleep on extra pillows at night in order to breathe.   Complete by:  As directed    Diet - low sodium heart healthy   Complete by:  As directed    Discharge instructions   Complete by:  As directed    Please follow up with [facility]  As recommended.     Allergies as  of 06/28/2018      Reactions   Prednisone Shortness Of Breath, Swelling, Rash, Other (See Comments)   Sore throat, also   Zebeta [bisoprolol Fumarate] Other (See Comments)   Caused confusion   Eggs Or Egg-derived Products Other (See Comments)   Was told she is allergic to EGG WHITES   Statins Other (See Comments)   Gradually tired with daily Lipitor, made pt feel badly (Pt can take Crestor 5 mg 4 times per week and Zetia 5 mg two times a week)      Medication List    STOP taking these medications   furosemide 80 MG tablet Commonly known as:  LASIX   metolazone 2.5 MG tablet Commonly known as:  ZAROXOLYN     TAKE these medications   aspirin 81 MG tablet Take 81 mg by mouth daily.   azelastine 0.1 % nasal spray Commonly known as:  ASTELIN Place 1 spray into both nostrils daily.   citalopram 20 MG tablet Commonly known as:  CELEXA Take 20 mg by mouth daily.   [employer] B12 PO Take 1 capsule by mouth as directed.   ezetimibe 10 MG tablet Commonly known as:  ZETIA TAKE 1/2 TABLET TWICE A WEEK What changed:  See the new instructions.   fexofenadine 180 MG tablet Commonly known as:  ALLEGRA Take 180 mg by mouth daily as needed for allergies.   fish oil-omega-3 fatty acids 1000 MG capsule Take 2 g by mouth daily.   fluticasone 50 MCG/ACT nasal spray Commonly known as:  FLONASE Place 2 sprays into both nostrils daily.   IRON PO Take 1 capsule by mouth as directed.   isosorbide mononitrate 30 MG 24 hr tablet Commonly known as:  IMDUR Take 30 mg by mouth daily.   losartan 25 MG tablet Commonly known as:  COZAAR Take 0.5 tablets (12.5 mg total) by mouth 2 (two) times daily.   mexiletine 200 MG capsule Commonly known as:  MEXITIL Take 1 capsule (200 mg total) by mouth every 12 (twelve) hours.   nitroGLYCERIN 0.4 MG SL tablet Commonly known as:  NITROSTAT Place 1 tablet (0.4 mg total) under the tongue every 5 (five) minutes as needed for chest pain.   omeprazole 40  MG capsule Commonly known as:  PRILOSEC Take 40 mg by mouth daily.   polyethylene glycol packet Commonly known as:  MIRALAX / GLYCOLAX Take 17 g by mouth at bedtime.   PROAIR HFA 108 (90 Base) MCG/ACT inhaler Generic drug:  albuterol Inhale 2 puffs into the lungs every 6 (six) hours as needed for wheezing.   rosuvastatin 5 MG tablet Commonly known as:  CRESTOR Take 1 tablet by mouth on Monday, Wednesday, and Friday   spironolactone 25 MG tablet Commonly known as:  ALDACTONE Take 1 tablet (25 mg total) by mouth daily.   SYMBICORT 160-4.5 MCG/ACT inhaler Generic drug:  budesonide-formoterol Inhale 2 puffs into the lungs daily.   torsemide 20 MG tablet Commonly known as:  DEMADEX Take 2  tablets (40 mg total) by mouth daily.   TYLENOL ARTHRITIS PAIN 650 MG CR tablet Generic drug:  acetaminophen Take 650 mg by mouth every 8 (eight) hours as needed for pain.      Follow-up Information    [facility] Follow up on 07/06/2018.   Specialty:  Cardiology Why:  Heart Failure Followup at [facility]-3 pm-Parking/drop off at ER lot (enter under blue "Specialty Clinics" awning) or underneath [place] on [address] (Women's entrance, garage code: 1400, elevator 1st floor). Take am meds, bring med list/bottles. Contact information: [address] [id number] [place] [phone]         Allergies  Allergen Reactions  . Prednisone Shortness Of Breath, Swelling, Rash and Other (See Comments)    Sore throat, also  . Zebeta [Bisoprolol Fumarate] Other (See Comments)    Caused confusion  . Eggs Or Egg-Derived Products Other (See Comments)    Was told she is allergic to EGG WHITES  . Statins Other (See Comments)    Gradually tired with daily Lipitor, made pt feel badly (Pt can take Crestor 5 mg 4 times per week and Zetia 5 mg two times a week)    Consultations:  HEART FAILURE TEAM.     Procedures/Studies: Ct Abdomen Pelvis Wo Contrast  Result Date: 06/15/2018 CLINICAL DATA:  Blood in stool.  Abdominal pain. EXAM: CT ABDOMEN AND PELVIS WITHOUT CONTRAST TECHNIQUE: Multidetector CT imaging of the abdomen and pelvis was performed following the standard protocol without IV contrast. COMPARISON:  CT chest 04/19/2018. FINDINGS: Lower chest: Lung bases show mild dependent atelectasis. Heart is enlarged. No pericardial or pleural effusion. Distal esophagus is grossly unremarkable. Hepatobiliary: Liver is unremarkable. Gallbladder wall thickening versus pericholecystic fluid. No biliary ductal dilatation. Pancreas: Negative. Spleen: Negative. Adrenals/Urinary Tract: Right adrenal gland is unremarkable. There may be thickening of the left adrenal gland. Punctate stone in the right kidney. Ureters are decompressed. Bladder is grossly unremarkable. Stomach/Bowel: Stomach, small bowel and colon are unremarkable. Appendix not readily visualized. Vascular/Lymphatic: Atherosclerotic calcification of the arterial vasculature without abdominal aortic aneurysm. No pathologically enlarged lymph nodes. Reproductive: Uterus is visualized.  No adnexal mass. Other: Moderate ascites. No free air. Presacral edema. Mesenteries and peritoneum are otherwise unremarkable. Musculoskeletal: Degenerative changes in the spine. IMPRESSION: 1. Gallbladder wall thickening versus pericholecystic fluid. If there are symptoms localized to the right upper quadrant, ultrasound may be helpful in further evaluation, as clinically indicated. 2. Moderate ascites. 3.  Aortic atherosclerosis (ICD10-170.0). 4. Punctate right renal stone. Electronically Signed   By: Lorin Picket M.D.   On: 06/15/2018 19:57   Dg Chest 2 View  Result Date: 06/15/2018 CLINICAL DATA:  Bright red rectal bleeding since Sunday, abdominal pain, on aspirin, history CHF, asthma, hypertension, ischemic cardiomyopathy, prior MI and coronary stenting, smoker  EXAM: CHEST - 2 VIEW COMPARISON:  06/04/2018 FINDINGS: LEFT subclavian AICD lead tip projects over LEFT ventricle unchanged. Enlargement of cardiac silhouette with pulmonary vascular congestion. Atherosclerotic calcification aorta. Stable mediastinal contours. Lungs mildly hyperinflated but clear. No acute infiltrate, pleural effusion or pneumothorax. Bones demineralized. IMPRESSION: Enlargement of cardiac silhouette with pulmonary vascular congestion. No acute abnormalities. Electronically Signed   By: Lavonia Dana M.D.   On: 06/15/2018 16:47   Dg Chest 2 View  Result Date: 06/04/2018 CLINICAL DATA:  67 year old female with epigastric pain, and nausea. EXAM: CHEST - 2 VIEW COMPARISON:  Chest CT dated 04/19/2018 FINDINGS: Probable mild emphysematous changes of the lungs and chronic  bronchitic changes. There is blunting of the costophrenic angles which may be related to scarring. Trace pleural effusion is not excluded. There is no focal consolidation, there is no focal consolidation or pneumothorax. There is mild cardiomegaly. Left pectoral AICD device. No acute osseous pathology. IMPRESSION: 1. No acute cardiopulmonary process. 2. Chronic mild interstitial coarsening and bronchitic changes. Electronically Signed   By: Anner Crete M.D.   On: 06/04/2018 01:35   Nm Hepatobiliary Liver Func  Result Date: 06/16/2018 CLINICAL DATA:  Right upper quadrant pain, nausea vomiting for 3 days EXAM: NUCLEAR MEDICINE HEPATOBILIARY IMAGING TECHNIQUE: Sequential images of the abdomen were obtained out to 60 minutes following intravenous administration of radiopharmaceutical. RADIOPHARMACEUTICALS:  4.9 mCi Tc-68m  Choletec IV COMPARISON:  None. FINDINGS: Prompt uptake of activity by the liver is seen. Delayed clearing of activity from the liver as can be seen with hepatocellular disease. Gallbladder activity is visualized, consistent with patency of cystic duct. Biliary activity passes into small bowel, consistent with  patent common bile duct. IMPRESSION: No biliary obstruction. Delayed clearing of activity from the liver as can be seen with hepatocellular disease. Electronically Signed   By: Kathreen Devoid   On: 06/16/2018 10:38   [place] Ekg Site Rite  Result Date: 06/19/2018 If Site Rite image not attached, placement could not be confirmed due to current cardiac rhythm.  [place] Ekg Site Rite  Result Date: 06/19/2018 If Site Rite image not attached, placement could not be confirmed due to current cardiac rhythm.  US Abdomen Limited Ruq  Result Date: 06/15/2018 CLINICAL DATA:  Abdominal pain for 4 days. EXAM: ULTRASOUND ABDOMEN LIMITED RIGHT UPPER QUADRANT COMPARISON:  CT abdomen and pelvis June 15, 2018. FINDINGS: Gallbladder: No echogenic gallstones. 3 mm echogenic mural base polyp present. Gallbladder wall thickened at 4 mm. No pericholecystic fluid. No sonographic Murphy sign elicited. Common bile duct: Diameter: 3 mm. Liver: No focal lesion identified. Within normal limits in parenchymal echogenicity. Echogenic granuloma. Portal vein is patent on color Doppler imaging with bidirectional color flow, pulsatile spectral waveforms. Anechoic free fluid. IMPRESSION: 1. Ascites. 2. Gallbladder wall thickening attributable to ascites without corroborated findings of acute cholecystitis. 3. 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 Electronically Signed   By: Elon Alas M.D.   On: 06/15/2018 22:43       Subjective:   Discharge Exam: Vitals:   06/28/18 0914 06/28/18 1227  BP:  101/61  Pulse: 85   Resp:  17  Temp:  98 F (36.7 C)  SpO2:  94%   Vitals:   06/28/18 0748 06/28/18 0757 06/28/18 0914 06/28/18 1227  BP: (!) 97/54   101/61  Pulse:   85   Resp: 18   17  Temp: 97.9 F (36.6 C)   98 F (36.7 C)  TempSrc: Oral   Oral  SpO2:  97%  94%  Weight:      Height:        General: Pt is alert, awake, not in acute  distress Cardiovascular: RRR, S1/S2 +, no rubs, no gallops Respiratory: CTA bilaterally, no wheezing, no rhonchi Abdominal: Soft, NT, ND, bowel sounds + Extremities: no edema, no cyanosis    The results of significant diagnostics from this hospitalization (including imaging, microbiology, ancillary and laboratory) are listed below for reference.     Microbiology: No results found for this or any previous visit (from the past 240 hour(s)).   Labs: BNP (last 3 results) Recent Labs  06/04/18 0132 06/15/18 1617 06/18/18 0601  BNP 3,135.7* 1,007.1* 2,197.5*   Basic Metabolic Panel: Recent Labs  Lab 06/27/18 0349 06/28/18 0402  NA 129* 129*  K 4.1 4.5  CL 90* 92*  CO2 29 29  GLUCOSE 109* 107*  BUN 24* 29*  CREATININE 1.33* 1.38*  CALCIUM 9.2 9.4   Liver Function Tests: No results for input(s): AST, ALT, ALKPHOS, BILITOT, PROT, ALBUMIN in the last 168 hours. No results for input(s): LIPASE, AMYLASE in the last 168 hours. No results for input(s): AMMONIA in the last 168 hours. CBC: Recent Labs  Lab 06/27/18 0349 06/28/18 0402  WBC 6.0 5.8  NEUTROABS 3.9 3.7  HGB 12.0 11.3*  HCT 38.6 36.8  MCV 91.5 91.5  PLT 147* 149*   Cardiac Enzymes: No results for input(s): CKTOTAL, CKMB, CKMBINDEX, TROPONINI in the last 168 hours. BNP: Invalid input(s): POCBNP CBG: No results for input(s): GLUCAP in the last 168 hours. D-Dimer No results for input(s): DDIMER in the last 72 hours. Hgb A1c No results for input(s): HGBA1C in the last 72 hours. Lipid Profile No results for input(s): CHOL, HDL, LDLCALC, TRIG, CHOLHDL, LDLDIRECT in the last 72 hours. Thyroid function studies No results for input(s): TSH, T4TOTAL, T3FREE, THYROIDAB in the last 72 hours.  Invalid input(s): FREET3 Anemia work up No results for input(s): VITAMINB12, FOLATE, FERRITIN, TIBC, IRON, RETICCTPCT in the last 72 hours. Urinalysis    Component Value Date/Time   COLORURINE YELLOW 06/16/2018 0213    APPEARANCEUR HAZY (A) 06/16/2018 0213   LABSPEC 1.009 06/16/2018 0213   PHURINE 5.0 06/16/2018 0213   GLUCOSEU NEGATIVE 06/16/2018 0213   HGBUR NEGATIVE 06/16/2018 0213   BILIRUBINUR NEGATIVE 06/16/2018 0213   KETONESUR NEGATIVE 06/16/2018 0213   PROTEINUR NEGATIVE 06/16/2018 0213   NITRITE NEGATIVE 06/16/2018 0213   LEUKOCYTESUR MODERATE (A) 06/16/2018 0213   Sepsis Labs Invalid input(s): PROCALCITONIN,  WBC,  LACTICIDVEN Microbiology No results found for this or any previous visit (from the past 240 hour(s)).   Time coordinating discharge: 32 minutes  SIGNED:   Hosie Poisson, MD  [facility] 07/03/2018, 11:07 PM Pager   If 7PM-7AM, please contact night-coverage [email or web address] Password [email or web address]

## 2018-07-06 ENCOUNTER — Ambulatory Visit (HOSPITAL_COMMUNITY)
Admit: 2018-07-06 | Discharge: 2018-07-06 | Disposition: A | Discharge status: Home / Self Care | Payer: Medicare Other | Attending: Cardiology | Admitting: Cardiology

## 2018-07-06 ENCOUNTER — Encounter (HOSPITAL_COMMUNITY): Payer: Self-pay

## 2018-07-06 ENCOUNTER — Other Ambulatory Visit: Payer: Self-pay

## 2018-07-06 VITALS — BP 102/56 | HR 86 | Wt 123.0 lb

## 2018-07-06 DIAGNOSIS — I252 Old myocardial infarction: Secondary | ICD-10-CM | POA: Diagnosis not present

## 2018-07-06 DIAGNOSIS — J449 Chronic obstructive pulmonary disease, unspecified: Secondary | ICD-10-CM | POA: Insufficient documentation

## 2018-07-06 DIAGNOSIS — Z79899 Other long term (current) drug therapy: Secondary | ICD-10-CM | POA: Insufficient documentation

## 2018-07-06 DIAGNOSIS — F329 Major depressive disorder, single episode, unspecified: Secondary | ICD-10-CM | POA: Diagnosis not present

## 2018-07-06 DIAGNOSIS — I5022 Chronic systolic (congestive) heart failure: Secondary | ICD-10-CM | POA: Diagnosis not present

## 2018-07-06 DIAGNOSIS — I493 Ventricular premature depolarization: Secondary | ICD-10-CM | POA: Diagnosis not present

## 2018-07-06 DIAGNOSIS — J209 Acute bronchitis, unspecified: Secondary | ICD-10-CM | POA: Diagnosis not present

## 2018-07-06 DIAGNOSIS — Z95810 Presence of automatic (implantable) cardiac defibrillator: Secondary | ICD-10-CM | POA: Diagnosis not present

## 2018-07-06 DIAGNOSIS — I739 Peripheral vascular disease, unspecified: Secondary | ICD-10-CM | POA: Insufficient documentation

## 2018-07-06 DIAGNOSIS — R110 Nausea: Secondary | ICD-10-CM | POA: Diagnosis not present

## 2018-07-06 DIAGNOSIS — I110 Hypertensive heart disease with heart failure: Secondary | ICD-10-CM | POA: Diagnosis not present

## 2018-07-06 DIAGNOSIS — M19042 Primary osteoarthritis, left hand: Secondary | ICD-10-CM | POA: Diagnosis not present

## 2018-07-06 DIAGNOSIS — R001 Bradycardia, unspecified: Secondary | ICD-10-CM | POA: Diagnosis not present

## 2018-07-06 DIAGNOSIS — I6523 Occlusion and stenosis of bilateral carotid arteries: Secondary | ICD-10-CM | POA: Insufficient documentation

## 2018-07-06 DIAGNOSIS — F17210 Nicotine dependence, cigarettes, uncomplicated: Secondary | ICD-10-CM | POA: Diagnosis not present

## 2018-07-06 DIAGNOSIS — I2510 Atherosclerotic heart disease of native coronary artery without angina pectoris: Secondary | ICD-10-CM | POA: Diagnosis not present

## 2018-07-06 DIAGNOSIS — Z7982 Long term (current) use of aspirin: Secondary | ICD-10-CM | POA: Diagnosis not present

## 2018-07-06 DIAGNOSIS — E785 Hyperlipidemia, unspecified: Secondary | ICD-10-CM | POA: Insufficient documentation

## 2018-07-06 DIAGNOSIS — F418 Other specified anxiety disorders: Secondary | ICD-10-CM | POA: Insufficient documentation

## 2018-07-06 DIAGNOSIS — Z720 Tobacco use: Secondary | ICD-10-CM | POA: Diagnosis not present

## 2018-07-06 DIAGNOSIS — I255 Ischemic cardiomyopathy: Secondary | ICD-10-CM | POA: Insufficient documentation

## 2018-07-06 DIAGNOSIS — M19041 Primary osteoarthritis, right hand: Secondary | ICD-10-CM | POA: Diagnosis not present

## 2018-07-06 LAB — COMPREHENSIVE METABOLIC PANEL
ALBUMIN: 2.9 g/dL — AB (ref 3.5–5.0)
ALT: 62 U/L — ABNORMAL HIGH (ref 0–44)
ANION GAP: 7 (ref 5–15)
AST: 45 U/L — ABNORMAL HIGH (ref 15–41)
Alkaline Phosphatase: 93 U/L (ref 38–126)
BILIRUBIN TOTAL: 0.8 mg/dL (ref 0.3–1.2)
BUN: 20 mg/dL (ref 8–23)
CALCIUM: 8.7 mg/dL — AB (ref 8.9–10.3)
CO2: 24 mmol/L (ref 22–32)
Chloride: 96 mmol/L — ABNORMAL LOW (ref 98–111)
Creatinine, Ser: 1.28 mg/dL — ABNORMAL HIGH (ref 0.44–1.00)
GFR calc non Af Amer: 42 mL/min — ABNORMAL LOW (ref >60)
GFR, EST AFRICAN AMERICAN: 48 mL/min — AB (ref >60)
Glucose, Bld: 171 mg/dL — ABNORMAL HIGH (ref 70–99)
POTASSIUM: 4.3 mmol/L (ref 3.5–5.1)
SODIUM: 127 mmol/L — AB (ref 135–145)
TOTAL PROTEIN: 6.2 g/dL — AB (ref 6.5–8.1)

## 2018-07-06 LAB — MAGNESIUM: MAGNESIUM: 1.7 mg/dL (ref 1.7–2.4)

## 2018-07-06 LAB — BRAIN NATRIURETIC PEPTIDE: B Natriuretic Peptide: 1705.1 pg/mL — ABNORMAL HIGH (ref 0.0–100.0)

## 2018-07-06 MED ORDER — MEXILETINE HCL 150 MG PO CAPS: 150.0000 mg | ORAL_CAPSULE | Freq: Two times a day (BID) | ORAL | 11 refills | Status: AC

## 2018-07-06 MED ORDER — TORSEMIDE 20 MG PO TABS: 40.0000 mg | ORAL_TABLET | Freq: Two times a day (BID) | ORAL | 11 refills | Status: DC

## 2018-07-06 NOTE — Progress Notes (Signed)
Advanced Heart Failure Note   Patient ID: KEEGAN BENSCH, female   DOB: [birth year], 67 y.o.   MRN: [id number]  Date:  07/06/2018   ID:  ANNETT BOXWELL, DOB [birth year], MRN [id number]  PCP:  [name] in [place] Electrophysiologist:  Dr. Thompson Grayer  General Cardiologist: Dr Johnsie Cancel HF: Dr Haroldine Laws   History of Present Illness: HANADI STANLY is a 68 y.o. female who was initially referred to the HF Clinic for evaluation for advanced therapies.   She has a hx of CAD, s/p prior Ant MI, s/p prior POBA to LAD, Dx, RCA, CHF due to ICM with high scar burden by MRI (12/09), s/p ICD, HTN, HL, depression, tobacco abuse.   Admitted 2/8 - 01/17/18 with Acute/bronchitis + Influenza A. Diuresed with IV lasix with A/C CHF. Discharge weight 128 lbs. Finished course of doxycyline as outpatient.   Admitted 6/28 through 06/08/2018 with volume overload. Diuresed with IV lasix and transitioned to 80 mg lasix daily.  [name] and entresto stopped at d/c with creatinine bump. Discharge weight 128 pounds.   Admitted 7/10 through 06/28/2018. Admitted with increase dyspnea and and abdominal pain. Required short term milrinone and diuresed with IV lasix. Transitioned to torsemide 40 mg daily. GI consulted for abdominal pain. HIDDA scan was negative. Also had 20% PVCs so mexiletine was started. Discharge weight 115 pounds.   Today she returns for post hospital follow up. Complaining of fatigue. Complaining of nausea and diarrhea. SOB with inclines and steps. . Denies PND/orthopnea. No chest pain.  Weight at home has gone up 115-->120 pounds. Appetite poor. Taking all medications. Lives alone. Smoking 2-3 cigarettes per day.   ICD: activity < 1 hour per day. No VT. Fluid index trending up. Thoracic Impedance down.   ECHO 06/18/2018  - Left ventricle: Limited study. The cavity size was moderately   dilated. Wall thickness was normal. The estimated ejection   fraction was 15%. Diffuse hypokinesis with regional  variation.   Indeterminate diastolic function. - Aortic valve: Moderately calcified annulus. Trileaflet. - Mitral valve: Mildly calcified annulus. There was moderate   regurgitation. - Left atrium: The atrium was dilated. - Right ventricle: Pacer wire or catheter noted in right ventricle.   Systolic function was mildly reduced. - Right atrium: The atrium was dilated. - Tricuspid valve: There was moderate regurgitation. - Pulmonic valve: There was mild regurgitation. - Pulmonary arteries: PA peak pressure: 45 mm Hg (S). - Pericardium, extracardiac: There was no pericardial effusion.  ABI 7/14 R 0.93 L 0.95 MRI (12/09):  High scar burden, Inf and Apical AK, EF 24%.  Lexiscan Myoview (11/14): LV Ejection Fraction: 24%. Extensive scar in the inferior wall, apex and anterior wall. Minimal reversibility Carotid Doppler : August 2015 showed 60-79% bilateral ICA stenosis. No previous CVA. Cath 07/26/14 Left main: Calcified 40% mid to distal stenosis LAD: Long vessel wrapping the apex 20-30% plaque in midsection. Distal vessel is small.  LCX: Large OM-1 and large OM-2. 50% lesion in proximal OM-2 RCA: Dominant vessel. Calcified, eccentric 50-60% lesion in midsection . LV-gram done in the RAO projection: Ejection fraction = 25% Severe global HK of mid to distal LV. No MR.   LHC 06/22/18: Left Main  Calcified and relatively short left main with about 40% stenosis.  Left Anterior Descending  40% mid LAD stenosis.  Left Circumflex  50% stenosis proximal LCx just prior to stent. Patient stent proximal LCx.  Right Coronary Artery  40% mid RCA stenosis.   [place] 06/22/18:  RA mean 13 RV 48/13 PA 49/16, mean 27 PCWP mean 19 LV 110/27 AO 108/57 Oxygen saturations: PA 68% AO 98% Cardiac Output (Fick) 5.04  Cardiac Index (Fick) 3.16 PVR 1.59 WU Cardiac Output (Thermo) 3.68 Cardiac Index (Thermo) 2.31  CPX 11/14 FVC 2.08 (67%)  FEV1 1.58 (66%)  FEV1/FVC 76%  Resting HR: 84 Peak HR: 142  (91% age predicted max HR) BP rest: 114/60 BP peak: 148/70 (IPE) Peak VO2: 15.0 (63% predicted peak VO2) VE/VCO2 slope: 32 OUES: 0.85 Peak RER: 1.33 Ventilatory Threshold: 12.1 (50.8% predicted peak VO2) VE/MVV: 60.1% O2pulse: 6 (75% predicted O2pulse)  Admitted 2/8 - 01/17/18 with Acute/bronchitis + Influenza A. Diuresed with IV lasix with A/C CHF. Discharge weight 128 lbs. Finished course of doxycyline as outpatient.   LE angiography 03/02/18 showed no signficant aortoiliac disease. Left sided showed diffuse 60-70% disease affecting the proximal SFA followed by short occlusion distally with reconstitution via collaterals from the profunda and three-vessel runoff below the knee.   ECHO 06/2018 EF 15% RV mildly dilated.  Echo 09/17/16 :LVEF 25-30%, Grade 1  Labs (10/12):  K 3.2, creatinine 0.9, Hgb 13.5 Labs (9/14):    K 3.4, creatinine 1.0, pro BNP 1560, Hgb 11.8, HDL 25, LDL 60, ALT 29 Labs (10/14):  K 3.3=> 4.2, creatinine 1.2, BNP 1175 Labs (10/02/13) K 3.2 creatinine 0.9 Labs (12/13/13): LDL 134, AST 24, ALT 24, TC 221, TG 104 Labs (8/15): K 5.1, creatinine 1.1  Labs (11/15): LDL 79, HDL 67 Labs (3/16): K 3.9, creatinine 1.13 Lasbs (06/28/2018): K 4.5 Creatinine 1.38   Wt Readings from Last 3 Encounters:  07/06/18 123 lb (55.8 kg)  06/28/18 115 lb 9.6 oz (52.4 kg)  06/08/18 128 lb 8 oz (58.3 kg)     Past Medical History:  Diagnosis Date  . Arthritis    hands  . Asthma   . CAD (coronary artery disease)    a. s/p prior Ant MI, b. s/p prior POBA to LAD, Dx, RCA,;  c.  LHC (10/12):  dLAD 40, pCFX 30, OM1 50, pRCA 30;  d.  [place] Myoview (11/14):  High risk study; inferior, anterior, apical defects; minimal reversibility toward the apex; consistent with prior infarct and very mild peri-infarct ischemia, EF 24%, inferior/apical/anterior akinesis  . Chronic systolic heart failure ([place])   . Depression   . H/O hiatal hernia   . HLD (hyperlipidemia)   . HTN (hypertension)   .  Ischemic cardiomyopathy    MRI (12/09):  High scar burden, Inf and Apical AK, EF 24%.;  Echo (10/14): EF 15%, Gr 2 DD, mild to mod MR, mod LAE, RVSF mildly reduced, mod RAE, severe TR, PASP 49-53 (mod pulmonary HTN)  . MI (myocardial infarction) ([place])   . Tobacco abuse     Current Outpatient Medications  Medication Sig Dispense Refill  . acetaminophen (TYLENOL ARTHRITIS PAIN) 650 MG CR tablet Take 650 mg by mouth every 8 (eight) hours as needed for pain.     Marland Kitchen albuterol (PROAIR HFA) 108 (90 Base) MCG/ACT inhaler Inhale 2 puffs into the lungs every 6 (six) hours as needed for wheezing.    Marland Kitchen aspirin 81 MG tablet Take 81 mg by mouth daily.      Marland Kitchen azelastine (ASTELIN) 0.1 % nasal spray Place 1 spray into both nostrils daily.     . budesonide-formoterol (SYMBICORT) 160-4.5 MCG/ACT inhaler Inhale 2 puffs into the lungs daily.     . citalopram (CELEXA) 20 MG tablet Take 20 mg by mouth daily.    Marland Kitchen  Cyanocobalamin ([employer] B12 PO) Take 1 capsule by mouth as directed.    . digoxin (LANOXIN) 0.125 MG tablet Take 0.5 tablets (0.0625 mg total) by mouth daily. 15 tablet 3  . ezetimibe (ZETIA) 10 MG tablet TAKE 1/2 TABLET TWICE A WEEK (Patient taking differently: TAKE 1/2 TABLET TWICE A WEEK ON SAT AND SUN) 5 tablet 11  . fexofenadine (ALLEGRA) 180 MG tablet Take 180 mg by mouth daily as needed for allergies.     . fish oil-omega-3 fatty acids 1000 MG capsule Take 2 g by mouth daily.     . fluticasone (FLONASE) 50 MCG/ACT nasal spray Place 2 sprays into both nostrils daily.    . IRON PO Take 1 capsule by mouth as directed.    . isosorbide mononitrate (IMDUR) 30 MG 24 hr tablet Take 30 mg by mouth daily.    Marland Kitchen losartan (COZAAR) 25 MG tablet Take 0.5 tablets (12.5 mg total) by mouth 2 (two) times daily. 60 tablet 0  . mexiletine (MEXITIL) 200 MG capsule Take 1 capsule (200 mg total) by mouth every 12 (twelve) hours. 60 capsule 0  . nitroGLYCERIN (NITROSTAT) 0.4 MG SL tablet Place 1 tablet (0.4 mg total) under the  tongue every 5 (five) minutes as needed for chest pain. 25 tablet 3  . omeprazole (PRILOSEC) 40 MG capsule Take 40 mg by mouth daily.     . polyethylene glycol (MIRALAX / GLYCOLAX) packet Take 17 g by mouth at bedtime.    . rosuvastatin (CRESTOR) 5 MG tablet Take 1 tablet by mouth on Monday, Wednesday, and Friday 45 tablet 3  . spironolactone (ALDACTONE) 25 MG tablet Take 1 tablet (25 mg total) by mouth daily. 30 tablet 0  . torsemide (DEMADEX) 20 MG tablet Take 2 tablets (40 mg total) by mouth daily. 30 tablet 0   No current facility-administered medications for this encounter.    Allergies:    Allergies  Allergen Reactions  . Prednisone Shortness Of Breath, Swelling, Rash and Other (See Comments)    Sore throat, also  . Zebeta [Bisoprolol Fumarate] Other (See Comments)    Caused confusion  . Eggs Or Egg-Derived Products Other (See Comments)    Was told she is allergic to EGG WHITES  . Statins Other (See Comments)    Gradually tired with daily Lipitor, made pt feel badly (Pt can take Crestor 5 mg 4 times per week and Zetia 5 mg two times a week)   Social History:  The patient  reports that she has been smoking cigarettes.  She has never used smokeless tobacco. She reports that she does not drink alcohol or use drugs.   Review of systems complete and found to be negative unless listed in HPI.    Vitals:   07/06/18 1500  BP: (!) 102/56  Pulse: 86  SpO2: 98%  Weight: 123 lb (55.8 kg)   Wt Readings from Last 3 Encounters:  07/06/18 123 lb (55.8 kg)  06/28/18 115 lb 9.6 oz (52.4 kg)  06/08/18 128 lb 8 oz (58.3 kg)    PHYSICAL EXAM: General:  Appears chronically ill. No resp difficulty HEENT: normal Neck: supple. JVP 11-12 . Carotids 2+ bilat; no bruits. No lymphadenopathy or thryomegaly appreciated. Cor: PMI nondisplaced. Regular rate & rhythm. No rubs, gallops or murmurs. Lungs: clear Abdomen: soft, nontender, nondistended. No hepatosplenomegaly. No bruits or masses. Good  bowel sounds. Extremities: no cyanosis, clubbing, rash, R and LLE 1+ edema Neuro: alert & orientedx3, cranial nerves grossly intact. moves all 4  extremities w/o difficulty. Affect pleasant  ASSESSMENT AND PLAN:  1. CAD s/p previous anterior MI:  Cath 8/15 nonobstructive CAD.   -  Occasional CP in high-stress situations. Stable pattern.  - Continue ASA and statin. Continue imdur.  -No s/s ischemia.  2.  Chronic Systolic CHF  due to [facility] with mild RV dysfunction s/p [employer] ICD.  - Echo 01/15/18 LVEF 20-25%.  -NYHA IIIb. May need hospital admit next week for RHC and inotropes.  - Volume status elevated. Increase torsemide 40 mg twice a day. - Continue losartan 12.5 mg daily.  - Continue spironolactone 25 mg daily. - Does not tolerate beta blockers due to symptomatic brady in past. -3. COPD with ongoing tobacco abuse:  - On ABX for acute bronchitis -Discussed smoking cessation.  4. PAD with Intermittent claudication: Medical therapy.  - Moderate bilateral calf claudication worse on the left side due to known SFA disease. This has progressed over past few months   - Had peripheral vascular catheterization 03/02/18 with no significant aortoiliac disease. Diffuse 60-70% disease affecting the left proximal and mid SFA with short occlusion distally with reconstitution via collaterals from the profunda.  Three-vessel runoff below the knee. Risk factor management and smoking cessation.  - Encouraged complete tobacco cessation. 5. Bilateral carotid stenosis - Followed by Dr. Johnsie Cancel.  - No change to current plan.   6. H/o symptomatic bradycardia:  - Stable off BB. Do not plan to re-challenge.  - No change to current plan.   7. Hyperlipidemia with intolerance of multiple cholesterol meds: - Tolerating Crestor 5 mg three times/week and Zetia 5mg  two times weekly.  - Per PCP.  8. Depression/Anxiety:  - Stable. - Planning on starting Wellbutrin for smoking cessation. Per PCP 9. Nausea Recent  hospital admit she was evaluated by general surgery. HIDDA scan was negative. Suspect may be low ouput HF or mexiletine. .  10. PVCs 20 % burden in the hospital. No PVCs on EKG today. Cut back mexilitene to 150 mg twice a day to see if the helps with nausea.   Refer to HF Paramedicine.  Follow up next week. If no improvement she will need RHC/hosptial admit.    Darrick Grinder, NP  07/06/18

## 2018-07-06 NOTE — Patient Instructions (Addendum)
INCREASE Torsemide to 40 mg twice daily.  DECREASE Mexilitine to 150 mg twice daily. New Rx has been sent to your pharmacy for this dose.  Will refer you to our Heart Failure Paramedicine Program. This program is designed to help support you at home with medication management, vital sign and weight measurements, education about heart failure, and any other needs to support you. A certified EMT will call you to set up your initial appointment in the home, and will be available to you at a weekly basis, free of charge.  Routine lab work today. Will notify you of abnormal results, otherwise no news is good news!  Follow up next week.  _________________________________________________________________________________ [name] Code: 1500  Take all medication as prescribed the day of your appointment. Bring all medications with you to your appointment.  Do the following things EVERYDAY: 1) Weigh yourself in the morning before breakfast. Write it down and keep it in a log. 2) Take your medicines as prescribed 3) Eat low salt foods-Limit salt (sodium) to 2000 mg per day.  4) Stay as active as you can everyday 5) Limit all fluids for the day to less than 2 liters

## 2018-07-07 ENCOUNTER — Telehealth: Payer: Self-pay | Admitting: Licensed Clinical Social Worker

## 2018-07-07 NOTE — Telephone Encounter (Signed)
CSW attempted to reach patient to discuss referral to [facility] although no answer and unable to leave message. CSW will attempt again to reach patient.  Raquel Sarna, [place], [place]

## 2018-07-11 ENCOUNTER — Ambulatory Visit (INDEPENDENT_AMBULATORY_CARE_PROVIDER_SITE_OTHER): Payer: Medicare Other

## 2018-07-11 ENCOUNTER — Telehealth: Payer: Self-pay

## 2018-07-11 DIAGNOSIS — I5022 Chronic systolic (congestive) heart failure: Secondary | ICD-10-CM

## 2018-07-11 DIAGNOSIS — Z95810 Presence of automatic (implantable) cardiac defibrillator: Secondary | ICD-10-CM

## 2018-07-11 NOTE — Telephone Encounter (Signed)
Remote ICM transmission received.  Attempted call to patient and left message, per DPR, to return call.  

## 2018-07-11 NOTE — Progress Notes (Signed)
EPIC Encounter for ICM Monitoring  Patient Name: [name] is a 67 y.o. female Date: 07/11/2018 Primary Care Physican: Tyler Run, [facility] Cardiologist: Nishan/Bensimhon Electrophysiologist: Allred Dry Weight:  123lbs(office weight 7/31)       Attempted call to patient and unable to reach.  Left message to return call.  Transmission reviewed.   Hospitalized 7/10 through 06/28/2018.   Thoracic impedance abnormal suggesting fluid accumulation starting 06/29/2018 (day after hospital discharge).  Prescribed dosage: Torsemide 20 mg take 2 tablets (40 mg total) by mouth twice a day.    Labs: 07/06/2018 Creatinine 1.28, BUN 20, Potassium 4.3, Sodium 127, EGFR 42-48 06/28/2018 Creatinine 1.38, BUN 29, Potassium 4.5, Sodium 129, EGFR 38-44  06/27/2018 Creatinine 1.33, BUN 24, Potassium 4.1, Sodium 129, EGFR 40-46  06/26/2018 Creatinine 1.43, BUN 21, Potassium 3.8, Sodium 130, EGFR 36-42  06/25/2018 Creatinine 1.86, BUN 21, Potassium 4.0, Sodium 131, EGFR 27-31  06/24/2018 Creatinine 1.50, BUN 18, Potassium 3.5, Sodium 133, EGFR 34-40  06/23/2018 Creatinine 1.51, BUN 18, Potassium 3.4, Sodium 136, EGFR 34-40  06/22/2018 Creatinine 1.47, BUN 22, Potassium 3.5, Sodium 134, EGFR 35-41  06/21/2018 Creatinine 1.74, BUN 32, Potassium 3.9, Sodium 135, EGFR 29-33  06/20/2018 Creatinine 2.14, BUN 46, Potassium 3.2, Sodium 133, EGFR 22-26  06/19/2018 Creatinine 2.64, BUN 56, Potassium 4.6, Sodium 131, EGFR 17-20 06/18/2018 Creatinine 2.35, BUN 49, Potassium 4.2, Sodium 131, EGFR 20-23  06/17/2018 Creatinine 2.19, BUN 46, Potassium 3.9, Sodium 130, EGFR 22-25  06/16/2018 Creatinine 1.88, BUN 41, Potassium 4.6, Sodium 130, EGFR 26-30  06/15/2018 Creatinine 1.63, BUN 39, Potassium 3.3, Sodium 127, EGFR 30-35  06/08/2018 Creatinine 1.78, BUN 35, Potassium 4.3, Sodium 136, EGFR 28-32 06/07/2018 Creatinine 1.84, BUN 39, Potassium 3.5, Sodium 131, EGFR 27-31  06/06/2018  Creatinine 1.92, BUN 38, Potassium 3.9, Sodium 126, EGFR 26-30  06/05/2018 Creatinine 2.10, BUN 36, Potassium 4.8, Sodium 123, EGFR 23-27  06/04/2018 Creatinine 1.85, BUN 22, Potassium 4.6, Sodium 125, EGFR 27-31  06/03/2018 Creatinine 2.04, BUN 22, Potassium 4.4, Sodium 125, EGFR 24-27  04/18/2018 Creatinine 1.28, BUN 21, Potassium 4.0, Sodium 136, EGFR 42-48  03/21/2018 Creatinine 1.11, BUN 10, Potassium 4.3, Sodium 137, EGFR 49-57 BNP 726.9  Recommendations: NONE - Unable to reach.  Follow-up plan: ICM clinic phone appointment on 07/21/2018 to recheck fluid levels.   Office appointment scheduled 07/13/2018 with HF Clinic NP/PA.    Copy of ICM check sent to Dr. Rayann Heman and Dr Haroldine Laws for review.   3 month ICM trend: 07/11/2018    1 Year ICM trend:       Rosalene Billings, RN 07/11/2018 10:29 AM

## 2018-07-12 NOTE — Progress Notes (Addendum)
Patient returned call.  Weight today is 117 lbs at home.  Since hospital discharge on 7/23, she is having SOB and swelling in her feet.  She has an appointment with HF clinic tomorrow and will provide recommendations if needed.  She confirmed she will be at the appointment tomorrow.  Advised if symptoms get worse to use ER if needed.  She said she is strict with following a low salt diet.  She has been eating a lot more fruits and vegetables.  Will recheck fluid levels on 07/21/2018.

## 2018-07-12 NOTE — Progress Notes (Signed)
Advanced Heart Failure Note   Patient ID: [name], female   DOB: 1951-04-18, 67 y.o.   MRN: [id number]  Date:  07/13/2018   ID:  [name], DOB October 23, 1951, MRN [id number]  PCP:  [name] in [place] Electrophysiologist:  Dr. Thompson Grayer  General Cardiologist: Dr Johnsie Cancel HF: Dr Haroldine Laws   History of Present Illness: [name] is a 67 y.o. female who was initially referred to the [facility] for evaluation for advanced therapies.   She has a hx of CAD, s/p prior Ant MI, s/p prior POBA to LAD, Dx, RCA, CHF due to ICM with high scar burden by MRI (12/09), s/p ICD, HTN, HL, depression, tobacco abuse.   Admitted 2/8 - 01/17/18 with Acute/bronchitis + Influenza A. Diuresed with IV lasix with A/C CHF. Discharge weight 128 lbs. Finished course of doxycyline as outpatient.   Admitted 6/28 through 06/08/2018 with volume overload. Diuresed with IV lasix and transitioned to 80 mg lasix daily.  [name] and entresto stopped at d/c with creatinine bump. Discharge weight 128 pounds.   Admitted 7/10 through 06/28/2018. Admitted with increase dyspnea and and abdominal pain. Required short term milrinone and diuresed with IV lasix. Transitioned to torsemide 40 mg daily. GI consulted for abdominal pain. HIDDA scan was negative. Also had 20% PVCs so mexiletine was started. Discharge weight 115 pounds.   She presents today for regular follow up. She is down 7 lbs from last visit. She feels about the same as last week. Increasing her torsemide helped somewhat, but she has gradually gone back to how she felt before. She denies orthopnea or PND. No CP. Weight at home has come back down. Her appetite remains poor and she gets nauseated 45-60 minutes after eating. She is frustrated she is holding on to fluid more often.   Medtronic interrogation did not result.  ECHO 06/18/2018  - Left ventricle: Limited study. The cavity size was moderately   dilated. Wall thickness was normal. The estimated  ejection   fraction was 15%. Diffuse hypokinesis with regional variation.   Indeterminate diastolic function. - Aortic valve: Moderately calcified annulus. Trileaflet. - Mitral valve: Mildly calcified annulus. There was moderate   regurgitation. - Left atrium: The atrium was dilated. - Right ventricle: Pacer wire or catheter noted in right ventricle.   Systolic function was mildly reduced. - Right atrium: The atrium was dilated. - Tricuspid valve: There was moderate regurgitation. - Pulmonic valve: There was mild regurgitation. - Pulmonary arteries: PA peak pressure: 45 mm Hg (S). - Pericardium, extracardiac: There was no pericardial effusion.  ABI 7/14 R 0.93 L 0.95 MRI (12/09):  High scar burden, Inf and Apical AK, EF 24%.  Lexiscan Myoview (11/14): LV Ejection Fraction: 24%. Extensive scar in the inferior wall, apex and anterior wall. Minimal reversibility Carotid Doppler : August 2015 showed 60-79% bilateral ICA stenosis. No previous CVA. Cath 07/26/14 Left main: Calcified 40% mid to distal stenosis LAD: Long vessel wrapping the apex 20-30% plaque in midsection. Distal vessel is small.  LCX: Large OM-1 and large OM-2. 50% lesion in proximal OM-2 RCA: Dominant vessel. Calcified, eccentric 50-60% lesion in midsection . LV-gram done in the RAO projection: Ejection fraction = 25% Severe global HK of mid to distal LV. No MR.   LHC 06/22/18: Left Main  Calcified and relatively short left main with about 40% stenosis.  Left Anterior Descending  40% mid LAD stenosis.  Left Circumflex  50% stenosis proximal LCx just prior to stent. Patient  stent proximal LCx.  Right Coronary Artery  40% mid RCA stenosis.   RHC 06/22/18: RA mean 13 RV 48/13 PA 49/16, mean 27 PCWP mean 19 LV 110/27 AO 108/57 Oxygen saturations: PA 68% AO 98% Cardiac Output (Fick) 5.04  Cardiac Index (Fick) 3.16 PVR 1.59 WU Cardiac Output (Thermo) 3.68 Cardiac Index (Thermo) 2.31  CPX 11/14 FVC 2.08 (67%)    FEV1 1.58 (66%)  FEV1/FVC 76%  Resting HR: 84 Peak HR: 142 (91% age predicted max HR) BP rest: 114/60 BP peak: 148/70 (IPE) Peak VO2: 15.0 (63% predicted peak VO2) VE/VCO2 slope: 32 OUES: 0.85 Peak RER: 1.33 Ventilatory Threshold: 12.1 (50.8% predicted peak VO2) VE/MVV: 60.1% O2pulse: 6 (75% predicted O2pulse)  Admitted 2/8 - 01/17/18 with Acute/bronchitis + Influenza A. Diuresed with IV lasix with A/C CHF. Discharge weight 128 lbs. Finished course of doxycyline as outpatient.   LE angiography 03/02/18 showed no signficant aortoiliac disease. Left sided showed diffuse 60-70% disease affecting the proximal SFA followed by short occlusion distally with reconstitution via collaterals from the profunda and three-vessel runoff below the knee.   ECHO 06/2018 EF 15% RV mildly dilated.  Echo 09/17/16 :LVEF 25-30%, Grade 1  Labs (10/12):  K 3.2, creatinine 0.9, Hgb 13.5 Labs (9/14):    K 3.4, creatinine 1.0, pro BNP 1560, Hgb 11.8, HDL 25, LDL 60, ALT 29 Labs (10/14):  K 3.3=> 4.2, creatinine 1.2, BNP 1175 Labs (10/02/13) K 3.2 creatinine 0.9 Labs (12/13/13): LDL 134, AST 24, ALT 24, TC 221, TG 104 Labs (8/15): K 5.1, creatinine 1.1  Labs (11/15): LDL 79, HDL 67 Labs (3/16): K 3.9, creatinine 1.13 Lasbs (06/28/2018): K 4.5 Creatinine 1.38   Wt Readings from Last 3 Encounters:  07/13/18 118 lb (53.5 kg)  07/06/18 123 lb (55.8 kg)  06/28/18 115 lb 9.6 oz (52.4 kg)     Past Medical History:  Diagnosis Date  . Arthritis    hands  . Asthma   . CAD (coronary artery disease)    a. s/p prior Ant MI, b. s/p prior POBA to LAD, Dx, RCA,;  c.  LHC (10/12):  dLAD 40, pCFX 30, OM1 50, pRCA 30;  d.  Carlton Adam Myoview (11/14):  High risk study; inferior, anterior, apical defects; minimal reversibility toward the apex; consistent with prior infarct and very mild peri-infarct ischemia, EF 24%, inferior/apical/anterior akinesis  . Chronic systolic heart failure ([place])   . Depression   . H/O hiatal hernia    . HLD (hyperlipidemia)   . HTN (hypertension)   . Ischemic cardiomyopathy    MRI (12/09):  High scar burden, Inf and Apical AK, EF 24%.;  Echo (10/14): EF 15%, Gr 2 DD, mild to mod MR, mod LAE, RVSF mildly reduced, mod RAE, severe TR, PASP 49-53 (mod pulmonary HTN)  . MI (myocardial infarction) ([place])   . Tobacco abuse     Current Outpatient Medications  Medication Sig Dispense Refill  . acetaminophen (TYLENOL ARTHRITIS PAIN) 650 MG CR tablet Take 650 mg by mouth every 8 (eight) hours as needed for pain.     Marland Kitchen albuterol (PROAIR HFA) 108 (90 Base) MCG/ACT inhaler Inhale 2 puffs into the lungs every 6 (six) hours as needed for wheezing.    Marland Kitchen aspirin 81 MG tablet Take 81 mg by mouth daily.      Marland Kitchen azelastine (ASTELIN) 0.1 % nasal spray Place 1 spray into both nostrils daily.     . budesonide-formoterol (SYMBICORT) 160-4.5 MCG/ACT inhaler Inhale 2 puffs into the lungs daily.     Marland Kitchen  citalopram (CELEXA) 20 MG tablet Take 20 mg by mouth daily.    . Cyanocobalamin ([employer] B12 PO) Take 1 capsule by mouth as directed.    . digoxin (LANOXIN) 0.125 MG tablet Take 0.5 tablets (0.0625 mg total) by mouth daily. 15 tablet 3  . ezetimibe (ZETIA) 10 MG tablet TAKE 1/2 TABLET TWICE A WEEK (Patient taking differently: TAKE 1/2 TABLET TWICE A WEEK ON SAT AND SUN) 5 tablet 11  . fexofenadine (ALLEGRA) 180 MG tablet Take 180 mg by mouth daily as needed for allergies.     . fish oil-omega-3 fatty acids 1000 MG capsule Take 2 g by mouth daily.     . fluticasone (FLONASE) 50 MCG/ACT nasal spray Place 2 sprays into both nostrils daily.    . IRON PO Take 1 capsule by mouth as directed.    . isosorbide mononitrate (IMDUR) 30 MG 24 hr tablet Take 30 mg by mouth daily.    Marland Kitchen losartan (COZAAR) 25 MG tablet Take 0.5 tablets (12.5 mg total) by mouth 2 (two) times daily. 60 tablet 0  . mexiletine (MEXITIL) 150 MG capsule Take 1 capsule (150 mg total) by mouth 2 (two) times daily. 60 capsule 11  . nitroGLYCERIN (NITROSTAT) 0.4 MG  SL tablet Place 1 tablet (0.4 mg total) under the tongue every 5 (five) minutes as needed for chest pain. 25 tablet 3  . omeprazole (PRILOSEC) 40 MG capsule Take 40 mg by mouth daily.     . polyethylene glycol (MIRALAX / GLYCOLAX) packet Take 17 g by mouth at bedtime.    . rosuvastatin (CRESTOR) 5 MG tablet Take 1 tablet by mouth on Monday, Wednesday, and Friday 45 tablet 3  . spironolactone (ALDACTONE) 25 MG tablet Take 1 tablet (25 mg total) by mouth daily. 30 tablet 0  . torsemide (DEMADEX) 20 MG tablet Take 2 tablets (40 mg total) by mouth 2 (two) times daily. 120 tablet 11   No current facility-administered medications for this encounter.    Allergies:    Allergies  Allergen Reactions  . Prednisone Shortness Of Breath, Swelling, Rash and Other (See Comments)    Sore throat, also  . Zebeta [Bisoprolol Fumarate] Other (See Comments)    Caused confusion  . Eggs Or Egg-Derived Products Other (See Comments)    Was told she is allergic to EGG WHITES  . Statins Other (See Comments)    Gradually tired with daily Lipitor, made pt feel badly (Pt can take Crestor 5 mg 4 times per week and Zetia 5 mg two times a week)   Social History:  The patient  reports that she has been smoking cigarettes.  She has never used smokeless tobacco. She reports that she does not drink alcohol or use drugs.   Review of systems complete and found to be negative unless listed in HPI.    Vitals:   07/13/18 0948  BP: 100/62  Pulse: 86  SpO2: 98%  Weight: 118 lb (53.5 kg)   Wt Readings from Last 3 Encounters:  07/13/18 118 lb (53.5 kg)  07/06/18 123 lb (55.8 kg)  06/28/18 115 lb 9.6 oz (52.4 kg)    PHYSICAL EXAM: General: Chronically ill appearing. No resp difficulty. HEENT: Normal Neck: Supple. JVP 7-8 cm. Carotids 2+ bilat; no bruits. No thyromegaly or nodule noted. Cor: PMI nondisplaced. RRR, No M/G/R noted Lungs: CTAB, normal effort. Abdomen: Soft, non-tender, non-distended, no HSM. No bruits or  masses. +BS  Extremities: No cyanosis, clubbing, or rash. R and LLE with trace  to 1+ ankle edema. Warm to the touch.  Neuro: Alert & orientedx3, cranial nerves grossly intact. moves all 4 extremities w/o difficulty. Affect pleasant   ASSESSMENT AND PLAN:  1. CAD s/p previous anterior MI:  Cath 8/15 nonobstructive CAD.   - Occasional CP in high-stress situations. Stable pattern.  - No s/s of ischemia.    - Continue ASA and statin. Continue imdur.  2.  Chronic Systolic CHF  due to [facility] with mild RV dysfunction s/p Medtronic ICD.  - Echo 01/15/18 LVEF 20-25%.  - NYHA III symptoms, and overall seems improved from last week. .  - Volume status looks OK on exam.  - Continue torsemide 40 mg BID.  - Continue losartan 12.5 mg daily.  - Continue spironolactone 25 mg daily. - Does not tolerate beta blockers due to symptomatic brady in past. -3. COPD with ongoing tobacco abuse:  - Encouraged complete cessation.  4. PAD with Intermittent claudication: Medical therapy.  - Moderate bilateral calf claudication worse on the left side due to known SFA disease. This has progressed over past few months   - Had peripheral vascular catheterization 03/02/18 with no significant aortoiliac disease. Diffuse 60-70% disease affecting the left proximal and mid SFA with short occlusion distally with reconstitution via collaterals from the profunda.  Three-vessel runoff below the knee.  - Continue Risk factor management and smoking cessation.  5. Bilateral carotid stenosis - Followed by Dr. Johnsie Cancel.  - No change to current plan.   6. H/o symptomatic bradycardia:  - Stable off BB. Do not plan to re-challenge with concerns for low output.  7. Hyperlipidemia with intolerance of multiple cholesterol meds: - Tolerating Crestor 5 mg three times/week and Zetia 5mg  two times weekly.  - Per PCP.  8. Depression/Anxiety:  - Stable.  - Planning on starting Wellbutrin for smoking cessation. Per PCP 9. Nausea - Recent hospital  admit she was evaluated by general surgery. HIDDA scan was negative. Suspect may be low ouput HF or mexiletine.  - No change.  10. PVCs - 20 % burden in the hospital. No PVCs on EKG 07/06/18 - Continue mexilitene 150 mg BID.   Her fluid status and breathing have improved from last week, but overall she still feels "about the same". She is hesitant to consider admission for RHC and inotrope support for home, and has many questions about home milrinone. Long discussion today in clinic that she would not be transplant candidate at this time in setting of tobacco abuse, and would likely be poor candidate for VAD given severe COPD. Continue current medical regimen for now with marginal improvement. Labs today with close follow up. She knows to call for admission if she is feeling worse.   Shirley Friar, PA-C 07/13/18   Greater than 50% of the >30 minute visit was spent in counseling/coordination of care regarding disease state education, salt/fluid restriction, sliding scale diuretics, and medication compliance.

## 2018-07-13 ENCOUNTER — Ambulatory Visit (HOSPITAL_COMMUNITY)
Admission: RE | Admit: 2018-07-13 | Discharge: 2018-07-13 | Disposition: A | Discharge status: Home / Self Care | Payer: Medicare Other | Source: Ambulatory Visit | Attending: Internal Medicine | Admitting: Internal Medicine

## 2018-07-13 ENCOUNTER — Encounter (HOSPITAL_COMMUNITY): Payer: Self-pay

## 2018-07-13 VITALS — BP 100/62 | HR 86 | Wt 118.0 lb

## 2018-07-13 DIAGNOSIS — Z79899 Other long term (current) drug therapy: Secondary | ICD-10-CM | POA: Insufficient documentation

## 2018-07-13 DIAGNOSIS — I110 Hypertensive heart disease with heart failure: Secondary | ICD-10-CM | POA: Insufficient documentation

## 2018-07-13 DIAGNOSIS — R110 Nausea: Secondary | ICD-10-CM | POA: Insufficient documentation

## 2018-07-13 DIAGNOSIS — E785 Hyperlipidemia, unspecified: Secondary | ICD-10-CM | POA: Insufficient documentation

## 2018-07-13 DIAGNOSIS — I6523 Occlusion and stenosis of bilateral carotid arteries: Secondary | ICD-10-CM | POA: Insufficient documentation

## 2018-07-13 DIAGNOSIS — Z720 Tobacco use: Secondary | ICD-10-CM | POA: Diagnosis not present

## 2018-07-13 DIAGNOSIS — Z7951 Long term (current) use of inhaled steroids: Secondary | ICD-10-CM | POA: Insufficient documentation

## 2018-07-13 DIAGNOSIS — Z95810 Presence of automatic (implantable) cardiac defibrillator: Secondary | ICD-10-CM | POA: Diagnosis not present

## 2018-07-13 DIAGNOSIS — J449 Chronic obstructive pulmonary disease, unspecified: Secondary | ICD-10-CM | POA: Insufficient documentation

## 2018-07-13 DIAGNOSIS — I739 Peripheral vascular disease, unspecified: Secondary | ICD-10-CM | POA: Diagnosis not present

## 2018-07-13 DIAGNOSIS — Z7982 Long term (current) use of aspirin: Secondary | ICD-10-CM | POA: Insufficient documentation

## 2018-07-13 DIAGNOSIS — F419 Anxiety disorder, unspecified: Secondary | ICD-10-CM | POA: Diagnosis not present

## 2018-07-13 DIAGNOSIS — I779 Disorder of arteries and arterioles, unspecified: Secondary | ICD-10-CM

## 2018-07-13 DIAGNOSIS — I2510 Atherosclerotic heart disease of native coronary artery without angina pectoris: Secondary | ICD-10-CM | POA: Diagnosis not present

## 2018-07-13 DIAGNOSIS — I252 Old myocardial infarction: Secondary | ICD-10-CM | POA: Insufficient documentation

## 2018-07-13 DIAGNOSIS — I5022 Chronic systolic (congestive) heart failure: Secondary | ICD-10-CM | POA: Diagnosis present

## 2018-07-13 DIAGNOSIS — Z888 Allergy status to other drugs, medicaments and biological substances status: Secondary | ICD-10-CM | POA: Diagnosis not present

## 2018-07-13 DIAGNOSIS — I255 Ischemic cardiomyopathy: Secondary | ICD-10-CM | POA: Insufficient documentation

## 2018-07-13 DIAGNOSIS — I493 Ventricular premature depolarization: Secondary | ICD-10-CM | POA: Insufficient documentation

## 2018-07-13 DIAGNOSIS — F17210 Nicotine dependence, cigarettes, uncomplicated: Secondary | ICD-10-CM | POA: Diagnosis not present

## 2018-07-13 DIAGNOSIS — F329 Major depressive disorder, single episode, unspecified: Secondary | ICD-10-CM | POA: Insufficient documentation

## 2018-07-13 DIAGNOSIS — I10 Essential (primary) hypertension: Secondary | ICD-10-CM

## 2018-07-13 LAB — BASIC METABOLIC PANEL
Anion gap: 12 (ref 5–15)
BUN: 16 mg/dL (ref 8–23)
CALCIUM: 9.2 mg/dL (ref 8.9–10.3)
CO2: 25 mmol/L (ref 22–32)
Chloride: 99 mmol/L (ref 98–111)
Creatinine, Ser: 1.18 mg/dL — ABNORMAL HIGH (ref 0.44–1.00)
GFR calc Af Amer: 53 mL/min — ABNORMAL LOW (ref >60)
GFR, EST NON AFRICAN AMERICAN: 46 mL/min — AB (ref >60)
Glucose, Bld: 103 mg/dL — ABNORMAL HIGH (ref 70–99)
POTASSIUM: 3.8 mmol/L (ref 3.5–5.1)
Sodium: 136 mmol/L (ref 135–145)

## 2018-07-13 LAB — CBC
HEMATOCRIT: 36.2 % (ref 36.0–46.0)
Hemoglobin: 11.3 g/dL — ABNORMAL LOW (ref 12.0–15.0)
MCH: 29.3 pg (ref 26.0–34.0)
MCHC: 31.2 g/dL (ref 30.0–36.0)
MCV: 93.8 fL (ref 78.0–100.0)
Platelets: 144 10*3/uL — ABNORMAL LOW (ref 150–400)
RBC: 3.86 MIL/uL — ABNORMAL LOW (ref 3.87–5.11)
RDW: 20.5 % — AB (ref 11.5–15.5)
WBC: 6.5 10*3/uL (ref 4.0–10.5)

## 2018-07-13 NOTE — Patient Instructions (Signed)
Routine lab work today. Will notify you of abnormal results, otherwise no news is good news!  No changes to medication at this time.  Follow up 2 weeks.  _____________________________________________________________________ [name] Code: 1500  Take all medication as prescribed the day of your appointment. Bring all medications with you to your appointment.  Do the following things EVERYDAY: 1) Weigh yourself in the morning before breakfast. Write it down and keep it in a log. 2) Take your medicines as prescribed 3) Eat low salt foods-Limit salt (sodium) to 2000 mg per day.  4) Stay as active as you can everyday 5) Limit all fluids for the day to less than 2 liters

## 2018-07-19 ENCOUNTER — Encounter (HOSPITAL_COMMUNITY): Payer: Medicare Other | Admitting: Internal Medicine

## 2018-07-21 ENCOUNTER — Ambulatory Visit (INDEPENDENT_AMBULATORY_CARE_PROVIDER_SITE_OTHER): Payer: Medicare Other

## 2018-07-21 ENCOUNTER — Telehealth: Payer: Self-pay | Admitting: Cardiology

## 2018-07-21 DIAGNOSIS — Z95810 Presence of automatic (implantable) cardiac defibrillator: Secondary | ICD-10-CM

## 2018-07-21 DIAGNOSIS — I5022 Chronic systolic (congestive) heart failure: Secondary | ICD-10-CM

## 2018-07-21 NOTE — Progress Notes (Signed)
EPIC Encounter for ICM Monitoring  Patient Name: [name] is a 67 y.o. female Date: 07/21/2018 Primary Care Physican: [facility] Cardiologist: Nishan/Bensimhon Electrophysiologist: Allred Dry Weight:112lbs      Heart Failure questions reviewed, pt asymptomatic.   Thoracic impedance almost at baseline normal.  Prescribed dosage: Torsemide 20 mg take 2 tablets (40 mg total) by mouth twice a day.   Labs: 07/06/2018 Creatinine 1.28, BUN 20, Potassium 4.3, Sodium 127, EGFR 42-48 06/28/2018 Creatinine 1.38, BUN 29, Potassium 4.5, Sodium 129, EGFR 38-44  06/27/2018 Creatinine 1.33, BUN 24, Potassium 4.1, Sodium 129, EGFR 40-46  06/26/2018 Creatinine 1.43, BUN 21, Potassium 3.8, Sodium 130, EGFR 36-42  06/25/2018 Creatinine 1.86, BUN 21, Potassium 4.0, Sodium 131, EGFR 27-31  06/24/2018 Creatinine 1.50, BUN 18, Potassium 3.5, Sodium 133, EGFR 34-40  06/23/2018 Creatinine 1.51, BUN 18, Potassium 3.4, Sodium 136, EGFR 34-40  06/22/2018 Creatinine 1.47, BUN 22, Potassium 3.5, Sodium 134, EGFR 35-41  06/21/2018 Creatinine 1.74, BUN 32, Potassium 3.9, Sodium 135, EGFR 29-33  06/20/2018 Creatinine 2.14, BUN 46, Potassium 3.2, Sodium 133, EGFR 22-26  06/19/2018 Creatinine 2.64, BUN 56, Potassium 4.6, Sodium 131, EGFR 17-20 06/18/2018 Creatinine 2.35, BUN 49, Potassium 4.2, Sodium 131, EGFR 20-23  06/17/2018 Creatinine 2.19, BUN 46, Potassium 3.9, Sodium 130, EGFR 22-25  06/16/2018 Creatinine 1.88, BUN 41, Potassium 4.6, Sodium 130, EGFR 26-30  06/15/2018 Creatinine 1.63, BUN 39, Potassium 3.3, Sodium 127, EGFR 30-35  06/08/2018 Creatinine1.78, BUN35, Potassium4.3, Sodium136, [id number] 06/07/2018 Creatinine1.84, BUN39, Potassium3.5, Sodium131, [id number]  06/06/2018 Creatinine1.92, BUN38, Potassium3.9, Sodium126, [id number]  06/05/2018 Creatinine2.10, BUN36, Potassium4.8, Sodium123, [id number]  06/04/2018 Creatinine1.85, BUN22,  Potassium4.6, Sodium125, [id number]  06/03/2018 Creatinine2.04, BUN22, Potassium4.4, [id number], [id number]  04/18/2018 Creatinine1.28, BUN21, Potassium4.0, [id number], [id number] 03/21/2018 Creatinine 1.11, BUN 10, Potassium 4.3, Sodium 137, EGFR 49-57 BNP 726.9  Recommendations: No changes.   Encouraged to call for fluid symptoms.  Follow-up plan: ICM clinic phone appointment on 08/15/2018.   Office appointment scheduled 07/26/2018 with [facility] Clinic NP/PA.    Copy of ICM check sent to Dr. Rayann Heman and Dr. Haroldine Laws.   3 month ICM trend: 07/21/2018    1 Year ICM trend:       Rosalene Billings, RN 07/21/2018 4:44 PM

## 2018-07-21 NOTE — Telephone Encounter (Signed)
Spoke with pt and reminded pt of remote transmission that is due today. Pt verbalized understanding.   

## 2018-07-22 ENCOUNTER — Other Ambulatory Visit (HOSPITAL_COMMUNITY): Payer: Self-pay | Admitting: Student

## 2018-07-26 ENCOUNTER — Ambulatory Visit: Payer: Medicare Other | Admitting: Cardiovascular Disease

## 2018-07-26 ENCOUNTER — Ambulatory Visit (HOSPITAL_COMMUNITY)
Admission: RE | Admit: 2018-07-26 | Discharge: 2018-07-26 | Disposition: A | Discharge status: Home / Self Care | Payer: Medicare Other | Source: Ambulatory Visit | Attending: Internal Medicine | Admitting: Internal Medicine

## 2018-07-26 VITALS — BP 104/60 | HR 78 | Wt 116.0 lb

## 2018-07-26 DIAGNOSIS — Z79899 Other long term (current) drug therapy: Secondary | ICD-10-CM | POA: Diagnosis not present

## 2018-07-26 DIAGNOSIS — I5022 Chronic systolic (congestive) heart failure: Secondary | ICD-10-CM | POA: Diagnosis not present

## 2018-07-26 DIAGNOSIS — Z720 Tobacco use: Secondary | ICD-10-CM | POA: Diagnosis not present

## 2018-07-26 DIAGNOSIS — I110 Hypertensive heart disease with heart failure: Secondary | ICD-10-CM | POA: Insufficient documentation

## 2018-07-26 DIAGNOSIS — F329 Major depressive disorder, single episode, unspecified: Secondary | ICD-10-CM | POA: Diagnosis not present

## 2018-07-26 DIAGNOSIS — Z888 Allergy status to other drugs, medicaments and biological substances status: Secondary | ICD-10-CM | POA: Diagnosis not present

## 2018-07-26 DIAGNOSIS — I252 Old myocardial infarction: Secondary | ICD-10-CM | POA: Insufficient documentation

## 2018-07-26 DIAGNOSIS — R110 Nausea: Secondary | ICD-10-CM

## 2018-07-26 DIAGNOSIS — I493 Ventricular premature depolarization: Secondary | ICD-10-CM | POA: Diagnosis not present

## 2018-07-26 DIAGNOSIS — I2510 Atherosclerotic heart disease of native coronary artery without angina pectoris: Secondary | ICD-10-CM

## 2018-07-26 DIAGNOSIS — I739 Peripheral vascular disease, unspecified: Secondary | ICD-10-CM | POA: Insufficient documentation

## 2018-07-26 DIAGNOSIS — I6523 Occlusion and stenosis of bilateral carotid arteries: Secondary | ICD-10-CM | POA: Diagnosis not present

## 2018-07-26 DIAGNOSIS — F17210 Nicotine dependence, cigarettes, uncomplicated: Secondary | ICD-10-CM | POA: Insufficient documentation

## 2018-07-26 DIAGNOSIS — Z7982 Long term (current) use of aspirin: Secondary | ICD-10-CM | POA: Diagnosis not present

## 2018-07-26 DIAGNOSIS — I255 Ischemic cardiomyopathy: Secondary | ICD-10-CM | POA: Diagnosis not present

## 2018-07-26 DIAGNOSIS — J449 Chronic obstructive pulmonary disease, unspecified: Secondary | ICD-10-CM | POA: Diagnosis not present

## 2018-07-26 DIAGNOSIS — E785 Hyperlipidemia, unspecified: Secondary | ICD-10-CM | POA: Diagnosis not present

## 2018-07-26 NOTE — Patient Instructions (Signed)
Cardiopulmonary test has been ordered for you, please see attached instruction sheet.   Follow up with Dr. Haroldine Laws in 4-5 weeks.

## 2018-07-26 NOTE — Progress Notes (Signed)
Advanced Heart Failure Note   Patient ID: [name], female   DOB: [birth year], 67 y.o.   MRN: [id number]  Date:  07/26/2018   ID:  [name], DOB [birth year], MRN [id number]  PCP:  [name] in [place] Electrophysiologist:  Dr. Thompson Grayer  General Cardiologist: Dr Johnsie Cancel HF: Dr Haroldine Laws   History of Present Illness: [name] is a 67 y.o. female who was initially referred to the [facility] Clinic for evaluation for advanced therapies.   She has a hx of CAD, s/p prior Ant MI, s/p prior POBA to LAD, Dx, RCA, CHF due to ICM with high scar burden by MRI (12/09), s/p ICD, HTN, HL, depression, tobacco abuse.   Admitted 2/8 - 01/17/18 with Acute/bronchitis + Influenza A. Diuresed with IV lasix with A/C CHF. Discharge weight 128 lbs. Finished course of doxycyline as outpatient.   Admitted 6/28 through 06/08/2018 with volume overload. Diuresed with IV lasix and transitioned to 80 mg lasix daily.  [name] and entresto stopped at d/c with creatinine bump. Discharge weight 128 pounds.   Admitted 7/10 through 06/28/2018. Admitted with increase dyspnea and and abdominal pain. Required short term milrinone and diuresed with IV lasix. Transitioned to torsemide 40 mg daily. GI consulted for abdominal pain. HIDDA scan was negative. Also had 20% PVCs so mexiletine was started. Discharge weight 115 pounds.   Today she returns for HF follow up. Overall feeling a little better but still having fatigue. Able to move around more at home. SOB with exertion. SOB with steps. Denies PND/Orthopnea. No chest pain. Appetite poor No fever or chills. Weight at home 113-116 pounds. Taking all medications. Smoking 5-6 cigarettes per day.   Medtronic Interrogation: Fluid below index. Activity ~2 hours. No VT/AF did not result.  ECHO 06/18/2018  - Left ventricle: Limited study. The cavity size was moderately   dilated. Wall thickness was normal. The estimated ejection   fraction was 15%. Diffuse hypokinesis  with regional variation.   Indeterminate diastolic function. - Aortic valve: Moderately calcified annulus. Trileaflet. - Mitral valve: Mildly calcified annulus. There was moderate   regurgitation. - Left atrium: The atrium was dilated. - Right ventricle: Pacer wire or catheter noted in right ventricle.   Systolic function was mildly reduced. - Right atrium: The atrium was dilated. - Tricuspid valve: There was moderate regurgitation. - Pulmonic valve: There was mild regurgitation. - Pulmonary arteries: PA peak pressure: 45 mm Hg (S). - Pericardium, extracardiac: There was no pericardial effusion.  ECHO 06/2018 EF 15% RV mildly dilated.  Echo 09/17/16 :LVEF 25-30%, Grade 1  ABI 7/14 R 0.93 L 0.95 MRI (12/09):  High scar burden, Inf and Apical AK, EF 24%.  Lexiscan Myoview (11/14): LV Ejection Fraction: 24%. Extensive scar in the inferior wall, apex and anterior wall. Minimal reversibility Carotid Doppler : August 2015 showed 60-79% bilateral ICA stenosis. No previous CVA. Cath 07/26/14 Left main: Calcified 40% mid to distal stenosis LAD: Long vessel wrapping the apex 20-30% plaque in midsection. Distal vessel is small.  LCX: Large OM-1 and large OM-2. 50% lesion in proximal OM-2 RCA: Dominant vessel. Calcified, eccentric 50-60% lesion in midsection . LV-gram done in the RAO projection: Ejection fraction = 25% Severe global HK of mid to distal LV. No MR.   LHC 06/22/18: Left Main  Calcified and relatively short left main with about 40% stenosis.  Left Anterior Descending  40% mid LAD stenosis.  Left Circumflex  50% stenosis proximal LCx just prior to stent. Patient  stent proximal LCx.  Right Coronary Artery  40% mid RCA stenosis.   RHC 06/22/18: RA mean 13 RV 48/13 PA 49/16, mean 27 PCWP mean 19 LV 110/27 AO 108/57 Oxygen saturations: PA 68% AO 98% Cardiac Output (Fick) 5.04  Cardiac Index (Fick) 3.16 PVR 1.59 WU Cardiac Output (Thermo) 3.68 Cardiac Index (Thermo)  2.31  CPX 11/14 FVC 2.08 (67%)  FEV1 1.58 (66%)  FEV1/FVC 76%  Resting HR: 84 Peak HR: 142 (91% age predicted max HR) BP rest: 114/60 BP peak: 148/70 (IPE) Peak VO2: 15.0 (63% predicted peak VO2) VE/VCO2 slope: 32 OUES: 0.85 Peak RER: 1.33 Ventilatory Threshold: 12.1 (50.8% predicted peak VO2) VE/MVV: 60.1% O2pulse: 6 (75% predicted O2pulse)  LE angiography 03/02/18 showed no signficant aortoiliac disease. Left sided showed diffuse 60-70% disease affecting the proximal SFA followed by short occlusion distally with reconstitution via collaterals from the profunda and three-vessel runoff below the knee.   Labs (10/12):  K 3.2, creatinine 0.9, Hgb 13.5 Labs (9/14):    K 3.4, creatinine 1.0, pro BNP 1560, Hgb 11.8, HDL 25, LDL 60, ALT 29 Labs (10/14):  K 3.3=> 4.2, creatinine 1.2, BNP 1175 Labs (10/02/13) K 3.2 creatinine 0.9 Labs (12/13/13): LDL 134, AST 24, ALT 24, TC 221, TG 104 Labs (8/15): K 5.1, creatinine 1.1  Labs (11/15): LDL 79, HDL 67 Labs (3/16): K 3.9, creatinine 1.13 Lasbs (06/28/2018): K 4.5 Creatinine 1.38   Wt Readings from Last 3 Encounters:  07/26/18 52.6 kg (116 lb)  07/13/18 53.5 kg (118 lb)  07/06/18 55.8 kg (123 lb)     Past Medical History:  Diagnosis Date  . Arthritis    hands  . Asthma   . CAD (coronary artery disease)    a. s/p prior Ant MI, b. s/p prior POBA to LAD, Dx, RCA,;  c.  LHC (10/12):  dLAD 40, pCFX 30, OM1 50, pRCA 30;  d.  [facility] Myoview (11/14):  High risk study; inferior, anterior, apical defects; minimal reversibility toward the apex; consistent with prior infarct and very mild peri-infarct ischemia, EF 24%, inferior/apical/anterior akinesis  . Chronic systolic heart failure ([place])   . Depression   . H/O hiatal hernia   . HLD (hyperlipidemia)   . HTN (hypertension)   . Ischemic cardiomyopathy    MRI (12/09):  High scar burden, Inf and Apical AK, EF 24%.;  Echo (10/14): EF 15%, Gr 2 DD, mild to mod MR, mod LAE, RVSF mildly reduced, mod  RAE, severe TR, PASP 49-53 (mod pulmonary HTN)  . MI (myocardial infarction) ([place])   . Tobacco abuse     Current Outpatient Medications  Medication Sig Dispense Refill  . acetaminophen (TYLENOL ARTHRITIS PAIN) 650 MG CR tablet Take 650 mg by mouth every 8 (eight) hours as needed for pain.     Marland Kitchen albuterol (PROAIR HFA) 108 (90 Base) MCG/ACT inhaler Inhale 2 puffs into the lungs every 6 (six) hours as needed for wheezing.    Marland Kitchen aspirin 81 MG tablet Take 81 mg by mouth daily.      Marland Kitchen azelastine (ASTELIN) 0.1 % nasal spray Place 1 spray into both nostrils daily.     . budesonide-formoterol (SYMBICORT) 160-4.5 MCG/ACT inhaler Inhale 2 puffs into the lungs daily.     . citalopram (CELEXA) 20 MG tablet Take 20 mg by mouth daily.    . Cyanocobalamin ([employer] B12 PO) Take 1 capsule by mouth as directed.    . digoxin (LANOXIN) 0.125 MG tablet Take 0.5 tablets (0.0625 mg total) by  mouth daily. 15 tablet 3  . ezetimibe (ZETIA) 10 MG tablet TAKE 1/2 TABLET TWICE A WEEK (Patient taking differently: TAKE 1/2 TABLET TWICE A WEEK ON SAT AND SUN) 5 tablet 11  . fexofenadine (ALLEGRA) 180 MG tablet Take 180 mg by mouth daily as needed for allergies.     . fish oil-omega-3 fatty acids 1000 MG capsule Take 2 g by mouth daily.     . fluticasone (FLONASE) 50 MCG/ACT nasal spray Place 2 sprays into both nostrils daily.    . IRON PO Take 1 capsule by mouth as directed.    . isosorbide mononitrate (IMDUR) 30 MG 24 hr tablet Take 30 mg by mouth daily.    Marland Kitchen losartan (COZAAR) 25 MG tablet Take 0.5 tablets (12.5 mg total) by mouth 2 (two) times daily. 60 tablet 0  . mexiletine (MEXITIL) 150 MG capsule Take 1 capsule (150 mg total) by mouth 2 (two) times daily. 60 capsule 11  . nitroGLYCERIN (NITROSTAT) 0.4 MG SL tablet Place 1 tablet (0.4 mg total) under the tongue every 5 (five) minutes as needed for chest pain. 25 tablet 3  . omeprazole (PRILOSEC) 40 MG capsule Take 40 mg by mouth daily.     . polyethylene glycol (MIRALAX /  GLYCOLAX) packet Take 17 g by mouth at bedtime.    . rosuvastatin (CRESTOR) 5 MG tablet Take 1 tablet by mouth on Monday, Wednesday, and Friday 45 tablet 3  . spironolactone (ALDACTONE) 25 MG tablet Take 1 tablet (25 mg total) by mouth daily. 30 tablet 0  . torsemide (DEMADEX) 20 MG tablet Take 2 tablets (40 mg total) by mouth 2 (two) times daily. 120 tablet 11   No current facility-administered medications for this encounter.    Allergies:    Allergies  Allergen Reactions  . Prednisone Shortness Of Breath, Swelling, Rash and Other (See Comments)    Sore throat, also  . Zebeta [Bisoprolol Fumarate] Other (See Comments)    Caused confusion  . Eggs Or Egg-Derived Products Other (See Comments)    Was told she is allergic to EGG WHITES  . Statins Other (See Comments)    Gradually tired with daily Lipitor, made pt feel badly (Pt can take Crestor 5 mg 4 times per week and Zetia 5 mg two times a week)   Social History:  The patient  reports that she has been smoking cigarettes. She has never used smokeless tobacco. She reports that she does not drink alcohol or use drugs.   Review of systems complete and found to be negative unless listed in HPI.    Vitals:   07/26/18 1434  BP: 104/60  Pulse: 78  SpO2: 97%  Weight: 52.6 kg (116 lb)   Wt Readings from Last 3 Encounters:  07/26/18 52.6 kg (116 lb)  07/13/18 53.5 kg (118 lb)  07/06/18 55.8 kg (123 lb)    PHYSICAL EXAM: General:  No resp difficulty. Walked slowly in the clinic.  HEENT: normal Neck: supple. no JVD. Carotids 2+ bilat; no bruits. No lymphadenopathy or thryomegaly appreciated. Cor: PMI nondisplaced. Regular rate & rhythm. No rubs, gallops or murmurs. Lungs: clear Abdomen: soft, nontender, nondistended. No hepatosplenomegaly. No bruits or masses. Good bowel sounds. Extremities: no cyanosis, clubbing, rash, edema Neuro: alert & orientedx3, cranial nerves grossly intact. moves all 4 extremities w/o difficulty. Affect  pleasant   ASSESSMENT AND PLAN:  1. CAD s/p previous anterior MI:  Cath 8/15 nonobstructive CAD.   - Occasional CP in high-stress situations. Stable pattern.  -  No s/s of ischemia.    - Continue ASA and statin. Continue imdur.  2.  Chronic Systolic CHF  due to [facility] with mild RV dysfunction s/p Medtronic ICD.  - Echo 06/2018 EF 15%   - NYHA II-III. Volume status stable. Continue current diuretic regimen.  -- Continue torsemide 40 mg BID.  -Continue 0.0625 mg daily.  - Continue losartan 12.5 mg daily.  - Continue spironolactone 25 mg daily. - Does not tolerate beta blockers due to symptomatic brady in past. -3. COPD with ongoing tobacco abuse:  Discussed smoking cessation.  4. PAD with Intermittent claudication: Medical therapy.  - Moderate bilateral calf claudication worse on the left side due to known SFA disease. This has progressed over past few months   - Had peripheral vascular catheterization 03/02/18 with no significant aortoiliac disease. Diffuse 60-70% disease affecting the left proximal and mid SFA with short occlusion distally with reconstitution via collaterals from the profunda.  Three-vessel runoff below the knee.  - Continue Risk factor management and smoking cessation.  5. Bilateral carotid stenosis - Followed by Dr. Johnsie Cancel.  - No change to current plan.   6. H/o symptomatic bradycardia:  - Stable off BB. Do not plan to re-challenge with concerns for low output.  7. Hyperlipidemia with intolerance of multiple cholesterol meds: - Tolerating Crestor 5 mg three times/week and Zetia 5mg  two times weekly.  - Per PCP.  8. Depression/Anxiety:  - Stable.  - Planning on starting Wellbutrin for smoking cessation. Per PCP 9. Nausea - Recent hospital admit she was evaluated by general surgery. HIDDA scan was negative.  Continue PPI.  - No change.  10. PVCs - 20 % burden in the hospital. No PVCs on EKG 07/06/18 - Continue mexilitene 150 mg BID.   Follow up in 4 weeks. Plan to  repeat ECHO in a few months after PVCs have been suppressed.  Set up CPX test.    Darrick Grinder, NP 07/26/18

## 2018-07-27 ENCOUNTER — Other Ambulatory Visit (HOSPITAL_COMMUNITY): Payer: Self-pay | Admitting: Internal Medicine

## 2018-07-28 ENCOUNTER — Other Ambulatory Visit: Payer: Self-pay

## 2018-07-31 ENCOUNTER — Other Ambulatory Visit: Payer: Self-pay | Admitting: Physician Assistant

## 2018-08-01 NOTE — Telephone Encounter (Signed)
This is a CHF pt 

## 2018-08-03 ENCOUNTER — Encounter (HOSPITAL_COMMUNITY): Payer: Medicare Other

## 2018-08-03 ENCOUNTER — Ambulatory Visit (INDEPENDENT_AMBULATORY_CARE_PROVIDER_SITE_OTHER): Payer: Medicare Other | Admitting: *Deleted

## 2018-08-03 DIAGNOSIS — I255 Ischemic cardiomyopathy: Secondary | ICD-10-CM | POA: Diagnosis not present

## 2018-08-03 NOTE — Progress Notes (Signed)
Remote ICD transmission.   

## 2018-08-10 ENCOUNTER — Ambulatory Visit (HOSPITAL_COMMUNITY): Payer: Medicare Other | Attending: Cardiology

## 2018-08-10 DIAGNOSIS — I493 Ventricular premature depolarization: Secondary | ICD-10-CM | POA: Insufficient documentation

## 2018-08-10 DIAGNOSIS — I5022 Chronic systolic (congestive) heart failure: Secondary | ICD-10-CM | POA: Diagnosis not present

## 2018-08-10 DIAGNOSIS — R9439 Abnormal result of other cardiovascular function study: Secondary | ICD-10-CM | POA: Diagnosis not present

## 2018-08-10 DIAGNOSIS — I509 Heart failure, unspecified: Secondary | ICD-10-CM | POA: Insufficient documentation

## 2018-08-11 ENCOUNTER — Ambulatory Visit (HOSPITAL_COMMUNITY)
Admission: RE | Admit: 2018-08-11 | Discharge: 2018-08-11 | Disposition: A | Discharge status: Home / Self Care | Payer: Medicare Other | Source: Ambulatory Visit | Attending: Cardiovascular Disease | Admitting: Cardiovascular Disease

## 2018-08-11 DIAGNOSIS — I6523 Occlusion and stenosis of bilateral carotid arteries: Secondary | ICD-10-CM | POA: Diagnosis present

## 2018-08-14 ENCOUNTER — Other Ambulatory Visit (HOSPITAL_COMMUNITY): Payer: Self-pay | Admitting: Internal Medicine

## 2018-08-15 ENCOUNTER — Ambulatory Visit (INDEPENDENT_AMBULATORY_CARE_PROVIDER_SITE_OTHER): Payer: Medicare Other

## 2018-08-15 DIAGNOSIS — I5022 Chronic systolic (congestive) heart failure: Secondary | ICD-10-CM

## 2018-08-15 DIAGNOSIS — Z95810 Presence of automatic (implantable) cardiac defibrillator: Secondary | ICD-10-CM | POA: Diagnosis not present

## 2018-08-16 ENCOUNTER — Telehealth: Payer: Self-pay

## 2018-08-16 ENCOUNTER — Encounter: Payer: Self-pay | Admitting: Cardiovascular Disease

## 2018-08-16 ENCOUNTER — Ambulatory Visit (INDEPENDENT_AMBULATORY_CARE_PROVIDER_SITE_OTHER): Payer: Medicare Other | Admitting: Cardiovascular Disease

## 2018-08-16 VITALS — BP 95/56 | HR 93 | Ht 63.5 in | Wt 116.8 lb

## 2018-08-16 DIAGNOSIS — Z720 Tobacco use: Secondary | ICD-10-CM | POA: Diagnosis not present

## 2018-08-16 DIAGNOSIS — I739 Peripheral vascular disease, unspecified: Secondary | ICD-10-CM

## 2018-08-16 DIAGNOSIS — I2510 Atherosclerotic heart disease of native coronary artery without angina pectoris: Secondary | ICD-10-CM

## 2018-08-16 DIAGNOSIS — I5022 Chronic systolic (congestive) heart failure: Secondary | ICD-10-CM

## 2018-08-16 DIAGNOSIS — I259 Chronic ischemic heart disease, unspecified: Secondary | ICD-10-CM

## 2018-08-16 DIAGNOSIS — E785 Hyperlipidemia, unspecified: Secondary | ICD-10-CM

## 2018-08-16 NOTE — Telephone Encounter (Signed)
Remote ICM transmission received.  Attempted call to patient and left detailed message, per DPR, regarding transmission and next ICM scheduled for 09/15/2018.  Advised to return call for any fluid symptoms or questions.    

## 2018-08-16 NOTE — Progress Notes (Signed)
EPIC Encounter for ICM Monitoring  Patient Name: [name] is a 67 y.o. female Date: 08/16/2018 Primary Care Physican: [place], [place] [facility] Cardiologist: Nishan/Bensimhon Electrophysiologist: Allred Dry Weight:Previous weight 112lbs        Attempted call to patient and unable to reach.  Left detailed message, per DPR, regarding transmission.  Transmission reviewed.    Thoracic impedance normal.  Prescribed dosage: Torsemide 20 mg take 2 tablets(40 mg total) by mouthtwice a day.   Labs: 07/06/2018 Creatinine1.28, BUN20, Potassium4.3, [id number], [id number] 06/28/2018 Creatinine1.38, BUN29, Potassium4.5, Sodium129, [id number]  06/27/2018 Creatinine1.33, BUN24, Potassium4.1, [id number], [id number]  06/26/2018 Creatinine1.43, BUN21, Potassium3.8, Sodium130, [id number]  06/25/2018 Creatinine1.86, BUN21, Potassium4.0, Sodium131, HKUV75-05  06/24/2018 Creatinine1.50, BUN18, Potassium3.5, [id number], GFQM21-03  06/23/2018 Creatinine1.51, BUN18, Potassium3.4, [id number], [id number]  06/22/2018 Creatinine1.47, BUN22, Potassium3.5, [id number], [id number]  06/21/2018 Creatinine1.74, BUN32, Potassium3.9, [id number], XQKS08-13  06/20/2018 Creatinine2.14, BUN46, Potassium3.2, [id number], XVEZ50-15  06/19/2018 Creatinine2.64, BUN56, Potassium4.6, Sodium131, [id number] 06/18/2018 Creatinine2.35, BUN49, Potassium4.2, Sodium131, [id number]  06/17/2018 Creatinine2.19, BUN46, Potassium3.9, Sodium130, [id number]  06/16/2018 Creatinine1.88, BUN41, Potassium4.6, Sodium130, [id number]  06/15/2018 Creatinine1.63, BUN39, Potassium3.3, [id number], [id number] 06/08/2018 Creatinine1.78, BUN35, Potassium4.3, [id number], CCEQ33-74 06/07/2018 Creatinine1.84, BUN39, Potassium3.5, Sodium131, UZHQ60-47  06/06/2018 Creatinine1.92, BUN38, Potassium3.9, Sodium126, VVYX21-58  06/05/2018 Creatinine2.10, BUN36,  Potassium4.8, [id number], [id number]  06/04/2018 Creatinine1.85, BUN22, Potassium4.6, [id number], QFJU12-22  06/03/2018 Creatinine2.04, BUN22, Potassium4.4, IVHOYW314, CJAR01-10  04/18/2018 Creatinine1.28, BUN21, Potassium4.0, [id number], HDTP12-25  Recommendations: Left voice mail with ICM number and encouraged to call if experiencing any fluid symptoms.  Follow-up plan: ICM clinic phone appointment on 09/15/2018.   Office appointment scheduled 09/14/2018 with Dr. Haroldine Laws.    Copy of ICM check sent to Dr. Rayann Heman.   3 month ICM trend: 08/15/2018    1 Year ICM trend:       Rosalene Billings, RN 08/16/2018 9:21 AM

## 2018-08-16 NOTE — Patient Instructions (Signed)
Medication Instructions:  Your physician recommends that you continue on your current medications as directed. Please refer to the Current Medication list given to you today.   Labwork: None ordered  Testing/Procedures: None ordered  Follow-Up: Your physician wants you to follow-up in: 6 months with Dr.Arida You will receive a reminder letter in the mail two months in advance. If you don't receive a letter, please call our office to schedule the follow-up appointment.   Any Other Special Instructions Will Be Listed Below (If Applicable).     If you need a refill on your cardiac medications before your next appointment, please call your pharmacy.

## 2018-08-16 NOTE — Progress Notes (Signed)
Cardiology Office Note   Date:  08/16/2018   ID:  [name], DOB [birth year], MRN [id number]  PCP:  [facility]  Cardiologist:  Dr. Eber Jones  Chief Complaint  Patient presents with  . Follow-up      History of Present Illness: [name] is a 67 y.o. female who is here today for a follow up visit regarding peripheral arterial disease. She has a hx of CAD, s/p prior Ant MI, s/p prior POBA to LAD, CHF due to ICM with high scar burden by MRI (12/09), s/p ICD, HTN, HL, depression, carotid disease and tobacco abuse.  Most recent vascular studies showed an ABI of 0.96 on the right and 0.69 on the left.    Angiography in March of 2019 showed no significant aortoiliac disease.  On the left side, there was diffuse 60-70% disease affecting the proximal SFA followed by short occlusion distally with reconstitution via collaterals from the profunda and three-vessel runoff below the knee.  She had multiple hospitalizations recently due to heart failure which has required aggressive diuresis and short-term milrinone.  She reports stable left leg claudication which according to her is better than before.  She seems to be more limited by shortness of breath.  Past Medical History:  Diagnosis Date  . Arthritis    hands  . Asthma   . CAD (coronary artery disease)    a. s/p prior Ant MI, b. s/p prior POBA to LAD, Dx, RCA,;  c.  LHC (10/12):  dLAD 40, pCFX 30, OM1 50, pRCA 30;  d.  Carlton Adam Myoview (11/14):  High risk study; inferior, anterior, apical defects; minimal reversibility toward the apex; consistent with prior infarct and very mild peri-infarct ischemia, EF 24%, inferior/apical/anterior akinesis  . Chronic systolic heart failure ([place])   . Depression   . H/O hiatal hernia   . HLD (hyperlipidemia)   . HTN (hypertension)   . Ischemic cardiomyopathy    MRI (12/09):  High scar burden, Inf and Apical AK, EF 24%.;  Echo (10/14): EF 15%, Gr 2 DD,  mild to mod MR, mod LAE, RVSF mildly reduced, mod RAE, severe TR, PASP 49-53 (mod pulmonary HTN)  . MI (myocardial infarction) ([place])   . Tobacco abuse     Past Surgical History:  Procedure Laterality Date  . ABDOMINAL AORTOGRAM W/LOWER EXTREMITY N/A 03/02/2018   Procedure: ABDOMINAL AORTOGRAM W/LOWER EXTREMITY;  Surgeon: Wellington Hampshire, MD;  Location: [facility];  Service: Cardiovascular;  Laterality: N/A;  . CARDIAC DEFIBRILLATOR PLACEMENT  01/18/2009   MDT single chamber ICD implanted by Dr Rayann Heman   . cardiac stents    . COLONOSCOPY  03/18/2012   Procedure: COLONOSCOPY;  Surgeon: Beryle Beams, MD;  Location: [facility];  Service: Endoscopy;  Laterality: N/A;  . CORONARY ANGIOPLASTY    . ESOPHAGOGASTRODUODENOSCOPY N/A 03/10/2013   Procedure: ESOPHAGOGASTRODUODENOSCOPY (EGD);  Surgeon: Beryle Beams, MD;  Location: [facility];  Service: Endoscopy;  Laterality: N/A;  . LEFT HEART CATHETERIZATION WITH CORONARY ANGIOGRAM N/A 07/26/2014   Procedure: LEFT HEART CATHETERIZATION WITH CORONARY ANGIOGRAM;  Surgeon: Jolaine Artist, MD;  Location: [facility];  Service: Cardiovascular;  Laterality: N/A;  . RIGHT/LEFT HEART CATH AND CORONARY ANGIOGRAPHY N/A 06/22/2018   Procedure: RIGHT/LEFT HEART CATH AND CORONARY ANGIOGRAPHY;  Surgeon: Larey Dresser, MD;  Location: [place] [facility];  Service: Cardiovascular;  Laterality: N/A;     Current Outpatient Medications  Medication Sig Dispense Refill  .  acetaminophen (TYLENOL ARTHRITIS PAIN) 650 MG CR tablet Take 650 mg by mouth every 8 (eight) hours as needed for pain.     Marland Kitchen albuterol (PROAIR HFA) 108 (90 Base) MCG/ACT inhaler Inhale 2 puffs into the lungs every 6 (six) hours as needed for wheezing.    Marland Kitchen aspirin 81 MG tablet Take 81 mg by mouth daily.      Marland Kitchen azelastine (ASTELIN) 0.1 % nasal spray Place 1 spray into both nostrils daily.     . budesonide-formoterol (SYMBICORT) 160-4.5 MCG/ACT inhaler Inhale 2 puffs into the lungs  daily.     . citalopram (CELEXA) 20 MG tablet Take 20 mg by mouth daily.    . Cyanocobalamin (CVS B12 PO) Take 1 capsule by mouth as directed.    . digoxin (LANOXIN) 0.125 MG tablet Take 0.5 tablets (0.0625 mg total) by mouth daily. 15 tablet 3  . ezetimibe (ZETIA) 10 MG tablet TAKE 1/2 TABLET TWICE A WEEK (Patient taking differently: TAKE 1/2 TABLET TWICE A WEEK ON SAT AND SUN) 5 tablet 11  . fexofenadine (ALLEGRA) 180 MG tablet Take 180 mg by mouth daily as needed for allergies.     . fish oil-omega-3 fatty acids 1000 MG capsule Take 2 g by mouth daily.     . fluticasone (FLONASE) 50 MCG/ACT nasal spray Place 2 sprays into both nostrils daily.    . IRON PO Take 1 capsule by mouth as directed.    . isosorbide mononitrate (IMDUR) 30 MG 24 hr tablet Take 30 mg by mouth daily.    . isosorbide mononitrate (IMDUR) 30 MG 24 hr tablet TAKE 1 TABLET BY MOUTH EVERY DAY 90 tablet 3  . losartan (COZAAR) 25 MG tablet TAKE 0.5 TABLETS (12.5 MG TOTAL) BY MOUTH 2 (TWO) TIMES DAILY. 60 tablet 11  . mexiletine (MEXITIL) 150 MG capsule Take 1 capsule (150 mg total) by mouth 2 (two) times daily. 60 capsule 11  . nitroGLYCERIN (NITROSTAT) 0.4 MG SL tablet Place 1 tablet (0.4 mg total) under the tongue every 5 (five) minutes as needed for chest pain. 25 tablet 3  . omeprazole (PRILOSEC) 40 MG capsule Take 40 mg by mouth daily.     . polyethylene glycol (MIRALAX / GLYCOLAX) packet Take 17 g by mouth at bedtime.    . rosuvastatin (CRESTOR) 5 MG tablet Take 1 tablet by mouth on Monday, Wednesday, and Friday 45 tablet 3  . spironolactone (ALDACTONE) 25 MG tablet Take 1 tablet (25 mg total) by mouth daily. 30 tablet 0  . spironolactone (ALDACTONE) 25 MG tablet TAKE 1 TABLET BY MOUTH EVERY DAY 30 tablet 1  . torsemide (DEMADEX) 20 MG tablet Take 2 tablets (40 mg total) by mouth 2 (two) times daily. 120 tablet 11   No current facility-administered medications for this visit.     Allergies:   Prednisone; Zebeta  [bisoprolol fumarate]; Eggs or egg-derived products; and Statins    Social History:  The patient  reports that she has been smoking cigarettes. She has never used smokeless tobacco. She reports that she does not drink alcohol or use drugs.   Family History:  The patient's family history includes Diabetes Mellitus II in her sister; Stroke in her mother.    ROS:  Please see the history of present illness.   Otherwise, review of systems are positive for none.   All other systems are reviewed and negative.    PHYSICAL EXAM: VS:  BP (!) 95/56   Pulse 93   Ht 5' 3.5" (  1.613 m)   Wt 116 lb 12.8 oz (53 kg)   BMI 20.37 kg/m  , BMI Body mass index is 20.37 kg/m. GEN: Well nourished, well developed, in no acute distress  HEENT: normal  Neck: no JVD, carotid bruits, or masses Cardiac: RRR; no murmurs, rubs, or gallops,no edema  Respiratory:  clear to auscultation bilaterally, normal work of breathing GI: soft, nontender, nondistended, + BS MS: no deformity or atrophy  Skin: warm and dry, no rash Neuro:  Strength and sensation are intact Psych: euthymic mood, full affect Vascular: Femoral pulses normal bilaterally. Distal pulses are not palpable.     EKG:  EKG is not ordered today.    Recent Labs: 06/16/2018: TSH 4.353 07/06/2018: ALT 62; B Natriuretic Peptide 1,705.1; Magnesium 1.7 07/13/2018: BUN 16; Creatinine, Ser 1.18; Hemoglobin 11.3; Platelets 144; Potassium 3.8; Sodium 136    Lipid Panel    Component Value Date/Time   CHOL 69 06/16/2018 0255   TRIG 49 06/16/2018 0255   HDL 20 (L) 06/16/2018 0255   CHOLHDL 3.5 06/16/2018 0255   VLDL 10 06/16/2018 0255   LDLCALC 39 06/16/2018 0255   LDLDIRECT 134.7 12/13/2013 1043      Wt Readings from Last 3 Encounters:  08/16/18 116 lb 12.8 oz (53 kg)  07/26/18 116 lb (52.6 kg)  07/13/18 118 lb (53.5 kg)       PAD Screen 08/24/2017  Previous PAD dx? Yes  Previous surgical procedure? No  Pain with walking? Yes  Subsides with  rest? Yes  Feet/toe relief with dangling? No  Painful, non-healing ulcers? No  Extremities discolored? No      ASSESSMENT AND PLAN:  1.  Peripheral arterial disease: She has moderate left calf claudication due to occluded left SFA.  She actually seems to be more limited by shortness of breath and heart failure symptoms then claudication.  She also has very well-developed collaterals from the profunda and thus I doubt that revascularization would make a big difference in her functional capacity.  I recommend continuing medical therapy.  2. Coronary artery disease involving native coronary arteries with stable angina: No significant worsening of symptoms.  Continue medical therapy.  3. Chronic systolic heart failure: She appears to be euvolemic followed closely at the heart failure clinic.  4. Tobacco use: I again discussed with her the importance of smoking cessation.  5. Hyperlipidemia: Continue treatment with Zetia and rosuvastatin.  Most recent LDL was 39.    Disposition:   FU with me in 6 months  Signed,  Kathlyn Sacramento, MD  08/16/2018 10:03 AM    [place]

## 2018-08-18 ENCOUNTER — Other Ambulatory Visit (HOSPITAL_COMMUNITY): Payer: Self-pay | Admitting: Student

## 2018-08-23 ENCOUNTER — Other Ambulatory Visit (HOSPITAL_COMMUNITY): Payer: Self-pay | Admitting: Internal Medicine

## 2018-08-23 LAB — CUP PACEART REMOTE DEVICE CHECK
Battery Voltage: 2.66 V
Date Time Interrogation Session: 20190828052305
HighPow Impedance: 74 Ohm
Implantable Lead Model: 6935
Implantable Pulse Generator Implant Date: 20100212
Lead Channel Impedance Value: 520 Ohm
MDC IDC LEAD IMPLANT DT: 20100212
MDC IDC LEAD LOCATION: 753860
MDC IDC SET LEADCHNL RV PACING AMPLITUDE: 2.5 V
MDC IDC SET LEADCHNL RV PACING PULSEWIDTH: 0.4 ms
MDC IDC SET LEADCHNL RV SENSING SENSITIVITY: 0.3 mV
MDC IDC STAT BRADY RV PERCENT PACED: 0 %

## 2018-09-08 ENCOUNTER — Other Ambulatory Visit (HOSPITAL_COMMUNITY): Payer: Self-pay | Admitting: Internal Medicine

## 2018-09-14 ENCOUNTER — Other Ambulatory Visit: Payer: Self-pay

## 2018-09-14 ENCOUNTER — Ambulatory Visit (HOSPITAL_COMMUNITY)
Admission: RE | Admit: 2018-09-14 | Discharge: 2018-09-14 | Disposition: A | Discharge status: Home / Self Care | Payer: Medicare Other | Source: Ambulatory Visit | Attending: Internal Medicine | Admitting: Internal Medicine

## 2018-09-14 ENCOUNTER — Encounter (HOSPITAL_COMMUNITY): Payer: Self-pay | Admitting: Internal Medicine

## 2018-09-14 VITALS — BP 95/40 | HR 95 | Wt 121.0 lb

## 2018-09-14 DIAGNOSIS — I739 Peripheral vascular disease, unspecified: Secondary | ICD-10-CM | POA: Diagnosis not present

## 2018-09-14 DIAGNOSIS — F17210 Nicotine dependence, cigarettes, uncomplicated: Secondary | ICD-10-CM | POA: Insufficient documentation

## 2018-09-14 DIAGNOSIS — F329 Major depressive disorder, single episode, unspecified: Secondary | ICD-10-CM | POA: Insufficient documentation

## 2018-09-14 DIAGNOSIS — Z720 Tobacco use: Secondary | ICD-10-CM

## 2018-09-14 DIAGNOSIS — I2510 Atherosclerotic heart disease of native coronary artery without angina pectoris: Secondary | ICD-10-CM | POA: Diagnosis not present

## 2018-09-14 DIAGNOSIS — I493 Ventricular premature depolarization: Secondary | ICD-10-CM | POA: Insufficient documentation

## 2018-09-14 DIAGNOSIS — I5022 Chronic systolic (congestive) heart failure: Secondary | ICD-10-CM | POA: Diagnosis not present

## 2018-09-14 DIAGNOSIS — I6523 Occlusion and stenosis of bilateral carotid arteries: Secondary | ICD-10-CM | POA: Insufficient documentation

## 2018-09-14 DIAGNOSIS — I110 Hypertensive heart disease with heart failure: Secondary | ICD-10-CM | POA: Insufficient documentation

## 2018-09-14 DIAGNOSIS — Z79899 Other long term (current) drug therapy: Secondary | ICD-10-CM | POA: Insufficient documentation

## 2018-09-14 DIAGNOSIS — Z7982 Long term (current) use of aspirin: Secondary | ICD-10-CM | POA: Insufficient documentation

## 2018-09-14 DIAGNOSIS — I252 Old myocardial infarction: Secondary | ICD-10-CM | POA: Diagnosis not present

## 2018-09-14 DIAGNOSIS — E785 Hyperlipidemia, unspecified: Secondary | ICD-10-CM | POA: Diagnosis not present

## 2018-09-14 DIAGNOSIS — J449 Chronic obstructive pulmonary disease, unspecified: Secondary | ICD-10-CM | POA: Diagnosis not present

## 2018-09-14 DIAGNOSIS — Z888 Allergy status to other drugs, medicaments and biological substances status: Secondary | ICD-10-CM | POA: Insufficient documentation

## 2018-09-14 LAB — BRAIN NATRIURETIC PEPTIDE: B NATRIURETIC PEPTIDE 5: 1056.5 pg/mL — AB (ref 0.0–100.0)

## 2018-09-14 LAB — BASIC METABOLIC PANEL
ANION GAP: 9 (ref 5–15)
BUN: 16 mg/dL (ref 8–23)
CALCIUM: 9.3 mg/dL (ref 8.9–10.3)
CO2: 23 mmol/L (ref 22–32)
Chloride: 103 mmol/L (ref 98–111)
Creatinine, Ser: 1.31 mg/dL — ABNORMAL HIGH (ref 0.44–1.00)
GFR calc Af Amer: 47 mL/min — ABNORMAL LOW (ref >60)
GFR, EST NON AFRICAN AMERICAN: 41 mL/min — AB (ref >60)
Glucose, Bld: 135 mg/dL — ABNORMAL HIGH (ref 70–99)
POTASSIUM: 3.9 mmol/L (ref 3.5–5.1)
SODIUM: 135 mmol/L (ref 135–145)

## 2018-09-14 NOTE — Patient Instructions (Signed)
Increase Torsemide to 60 mg (3 tabs) Twice daily for a couple of days when your fluid level is up  Labs today  Your physician recommends that you schedule a follow-up appointment on Nov 5th at 44 am with VAD coordinators and Dr Haroldine Laws

## 2018-09-14 NOTE — Progress Notes (Signed)
Advanced Heart Failure Note   Patient ID: [name], female   DOB: [birth year], 67 y.o.   MRN: [id number]  Date:  09/14/2018   ID:  [name], DOB 1951/06/11, MRN [id number]  PCP:  [name] [place] Electrophysiologist:  Dr. Thompson Grayer  General Cardiologist: Dr Johnsie Cancel HF: Dr Haroldine Laws   History of Present Illness: [name] is a 67 y.o. female who was initially referred to the HF Clinic for evaluation for advanced therapies.   She has a hx of CAD, s/p prior Ant MI, s/p prior POBA to LAD, Dx, RCA, CHF due to ICM with high scar burden by MRI (12/09), s/p ICD, HTN, HL, depression, tobacco abuse.   Admitted 2/8 - 01/17/18 with Acute/bronchitis + Influenza A. Diuresed with IV lasix with A/C CHF. Discharge weight 128 lbs. Finished course of doxycyline as outpatient.   Admitted 6/28 through 06/08/2018 with volume overload. Diuresed with IV lasix and transitioned to 80 mg lasix daily.  [name] and entresto stopped at d/c with creatinine bump. Discharge weight 128 pounds.   Admitted 7/10 through 06/28/2018. Admitted with increase dyspnea and and abdominal pain. Required short term milrinone and diuresed with IV lasix. Transitioned to torsemide 40 mg daily. GI consulted for abdominal pain. HIDDA scan was negative. Also had 20% PVCs so mexiletine was started. Discharge weight 115 pounds.   She returns today for HF follow and to discuss CPX results. CPX testing last month showed severe HF limitation with pVO2 14.2 and slope 40.   Today she returns for HF follow up. Says she remains SOB and has extra fluid in her belly. Weighs herself everyday and normal weight is about 112 pounds. Today she was 118. Watching intake closely. Trying to limit to 32 ounces. Taking torsemide 40 bid. Not taking extra. Gets SOB with mild activity. No orthopnea or PND. Still smoking a few cigs per day. Had an episode of CP after pulling weeds last week. BP runs low and she occasionally gets dizzy. No  syncope.   ICD interrogated personally if Clinic: No AF/VT. Optivol trending up since September 11. Now just over threshold.  Activity ~2hours today.   CPX 08/10/18: FVC 1.96 (69%)    FEV1 1.46 (66%)     FEV1/FVC 74 (94%)     Resting HR: 83 Peak HR: 117  (77% age predicted max HR)  Peak Vo2 14.2 (64%) slope 40 Peak RER 1.11 OUES 0.81  ECHO 06/18/2018  - Left ventricle: Limited study. The cavity size was moderately   dilated. Wall thickness was normal. The estimated ejection   fraction was 15%. Diffuse hypokinesis with regional variation.   Indeterminate diastolic function. - Aortic valve: Moderately calcified annulus. Trileaflet. - Mitral valve: Mildly calcified annulus. There was moderate   regurgitation. - Left atrium: The atrium was dilated. - Right ventricle: Pacer wire or catheter noted in right ventricle.   Systolic function was mildly reduced. - Right atrium: The atrium was dilated. - Tricuspid valve: There was moderate regurgitation. - Pulmonic valve: There was mild regurgitation. - Pulmonary arteries: PA peak pressure: 45 mm Hg (S). - Pericardium, extracardiac: There was no pericardial effusion.  ECHO 06/2018 EF 15% RV mildly dilated.  Echo 09/17/16 :LVEF 25-30%, Grade 1  ABI 7/14 R 0.93 L 0.95 MRI (12/09):  High scar burden, Inf and Apical AK, EF 24%.  Lexiscan Myoview (11/14): LV Ejection Fraction: 24%. Extensive scar in the inferior wall, apex and anterior wall. Minimal reversibility Carotid Doppler : August  2015 showed 60-79% bilateral ICA stenosis. No previous CVA. Cath 07/26/14 Left main: Calcified 40% mid to distal stenosis LAD: Long vessel wrapping the apex 20-30% plaque in midsection. Distal vessel is small.  LCX: Large OM-1 and large OM-2. 50% lesion in proximal OM-2 RCA: Dominant vessel. Calcified, eccentric 50-60% lesion in midsection . LV-gram done in the RAO projection: Ejection fraction = 25% Severe global HK of mid to distal LV. No MR.   LHC  06/22/18: Left Main  Calcified and relatively short left main with about 40% stenosis.  Left Anterior Descending  40% mid LAD stenosis.  Left Circumflex  50% stenosis proximal LCx just prior to stent. Patient stent proximal LCx.  Right Coronary Artery  40% mid RCA stenosis.   RHC 06/22/18: RA mean 13 RV 48/13 PA 49/16, mean 27 PCWP mean 19 LV 110/27 AO 108/57 Oxygen saturations: PA 68% AO 98% Cardiac Output (Fick) 5.04  Cardiac Index (Fick) 3.16 PVR 1.59 WU Cardiac Output (Thermo) 3.68 Cardiac Index (Thermo) 2.31  CPX 11/14 FVC 2.08 (67%)  FEV1 1.58 (66%)  FEV1/FVC 76%  Resting HR: 84 Peak HR: 142 (91% age predicted max HR) BP rest: 114/60 BP peak: 148/70 (IPE) Peak VO2: 15.0 (63% predicted peak VO2) VE/VCO2 slope: 32 OUES: 0.85 Peak RER: 1.33 Ventilatory Threshold: 12.1 (50.8% predicted peak VO2) VE/MVV: 60.1% O2pulse: 6 (75% predicted O2pulse)  LE angiography 03/02/18 showed no signficant aortoiliac disease. Left sided showed diffuse 60-70% disease affecting the proximal SFA followed by short occlusion distally with reconstitution via collaterals from the profunda and three-vessel runoff below the knee.   Labs (10/12):  K 3.2, creatinine 0.9, Hgb 13.5 Labs (9/14):    K 3.4, creatinine 1.0, pro BNP 1560, Hgb 11.8, HDL 25, LDL 60, ALT 29 Labs (10/14):  K 3.3=> 4.2, creatinine 1.2, BNP 1175 Labs (10/02/13) K 3.2 creatinine 0.9 Labs (12/13/13): LDL 134, AST 24, ALT 24, TC 221, TG 104 Labs (8/15): K 5.1, creatinine 1.1  Labs (11/15): LDL 79, HDL 67 Labs (3/16): K 3.9, creatinine 1.13 Lasbs (06/28/2018): K 4.5 Creatinine 1.38   Wt Readings from Last 3 Encounters:  09/14/18 54.9 kg (121 lb)  08/16/18 53 kg (116 lb 12.8 oz)  07/26/18 52.6 kg (116 lb)     Past Medical History:  Diagnosis Date  . Arthritis    hands  . Asthma   . CAD (coronary artery disease)    a. s/p prior Ant MI, b. s/p prior POBA to LAD, Dx, RCA,;  c.  LHC (10/12):  dLAD 40, pCFX 30, OM1 50, pRCA  30;  d.  Carlton Adam Myoview (11/14):  High risk study; inferior, anterior, apical defects; minimal reversibility toward the apex; consistent with prior infarct and very mild peri-infarct ischemia, EF 24%, inferior/apical/anterior akinesis  . Chronic systolic heart failure ([place])   . Depression   . H/O hiatal hernia   . HLD (hyperlipidemia)   . HTN (hypertension)   . Ischemic cardiomyopathy    MRI (12/09):  High scar burden, Inf and Apical AK, EF 24%.;  Echo (10/14): EF 15%, Gr 2 DD, mild to mod MR, mod LAE, RVSF mildly reduced, mod RAE, severe TR, PASP 49-53 (mod pulmonary HTN)  . MI (myocardial infarction) ([place])   . Tobacco abuse     Current Outpatient Medications  Medication Sig Dispense Refill  . acetaminophen (TYLENOL ARTHRITIS PAIN) 650 MG CR tablet Take 650 mg by mouth every 8 (eight) hours as needed for pain.     Marland Kitchen albuterol (PROAIR HFA)  108 (90 Base) MCG/ACT inhaler Inhale 2 puffs into the lungs every 6 (six) hours as needed for wheezing.    Marland Kitchen aspirin 81 MG tablet Take 81 mg by mouth daily.      Marland Kitchen azelastine (ASTELIN) 0.1 % nasal spray Place 1 spray into both nostrils daily.     . budesonide-formoterol (SYMBICORT) 160-4.5 MCG/ACT inhaler Inhale 2 puffs into the lungs daily.     . citalopram (CELEXA) 20 MG tablet Take 20 mg by mouth daily.    . Cyanocobalamin (CVS B12 PO) Take 1 capsule by mouth as directed.    . digoxin (LANOXIN) 0.125 MG tablet TAKE 1/2 TABLET BY MOUTH EVERY DAY 45 tablet 3  . ezetimibe (ZETIA) 10 MG tablet TAKE 1/2 TABLET TWICE A WEEK 5 tablet 11  . fexofenadine (ALLEGRA) 180 MG tablet Take 180 mg by mouth daily as needed for allergies.     . fish oil-omega-3 fatty acids 1000 MG capsule Take 2 g by mouth daily.     . fluticasone (FLONASE) 50 MCG/ACT nasal spray Place 2 sprays into both nostrils daily.    . IRON PO Take 1 capsule by mouth as directed.    . isosorbide mononitrate (IMDUR) 30 MG 24 hr tablet TAKE 1 TABLET BY MOUTH EVERY DAY 90 tablet 3  . losartan  (COZAAR) 25 MG tablet TAKE 0.5 TABLETS (12.5 MG TOTAL) BY MOUTH 2 (TWO) TIMES DAILY. 60 tablet 11  . mexiletine (MEXITIL) 150 MG capsule Take 1 capsule (150 mg total) by mouth 2 (two) times daily. 60 capsule 11  . nitroGLYCERIN (NITROSTAT) 0.4 MG SL tablet Place 1 tablet (0.4 mg total) under the tongue every 5 (five) minutes as needed for chest pain. 25 tablet 3  . omeprazole (PRILOSEC) 40 MG capsule Take 40 mg by mouth daily.     . polyethylene glycol (MIRALAX / GLYCOLAX) packet Take 17 g by mouth at bedtime.    . rosuvastatin (CRESTOR) 5 MG tablet Take 1 tablet by mouth on Monday, Wednesday, and Friday 45 tablet 3  . spironolactone (ALDACTONE) 25 MG tablet Take 1 tablet (25 mg total) by mouth daily. 30 tablet 0  . torsemide (DEMADEX) 20 MG tablet Take 2 tablets (40 mg total) by mouth 2 (two) times daily. 120 tablet 11   No current facility-administered medications for this encounter.    Allergies:    Allergies  Allergen Reactions  . Prednisone Shortness Of Breath, Swelling, Rash and Other (See Comments)    Sore throat, also  . Zebeta [Bisoprolol Fumarate] Other (See Comments)    Caused confusion  . Eggs Or Egg-Derived Products Other (See Comments)    Was told she is allergic to EGG WHITES  . Statins Other (See Comments)    Gradually tired with daily Lipitor, made pt feel badly (Pt can take Crestor 5 mg 4 times per week and Zetia 5 mg two times a week)   Social History:  The patient  reports that she has been smoking cigarettes. She has never used smokeless tobacco. She reports that she does not drink alcohol or use drugs.   Review of systems complete and found to be negative unless listed in HPI.   Vitals:   09/14/18 1103  BP: (!) 95/40  Pulse: 95  SpO2: 100%  Weight: 54.9 kg (121 lb)   Wt Readings from Last 3 Encounters:  09/14/18 54.9 kg (121 lb)  08/16/18 53 kg (116 lb 12.8 oz)  07/26/18 52.6 kg (116 lb)    PHYSICAL  EXAM: General:No resp difficulty. HEENT:  Normal Neck: Supple. JVP 8-9 Carotids 2+ bilat; no bruits. No thyromegaly or nodule noted. Cor: PMI nondisplaced. RRR, No M/G/R noted Lungs: CTAB, normal effort. Decreased BS throughout Abdomen: Soft, non-tender, mildly distended, no HSM. No bruits or masses. +BS  Extremities: No cyanosis, clubbing, or rash. R and LLE no edema.  Neuro: Alert & orientedx3, cranial nerves grossly intact. moves all 4 extremities w/o difficulty. Affect pleasant   ASSESSMENT AND PLAN:  1.  Chronic Systolic CHF  due to [facility] with mild RV dysfunction s/p [employer] ICD.  - Echo 06/2018 EF 15%   - Progressive NYHA III-IIIB symtoms - CPX 9/19 with severe functional limitation due predominantly to  HF. - Volume status elevated. Continue torsemide 40 mg BID. Increase to 60 bid when fluid overloaded.  - Continue digoxin 0.0625 mg daily.  - Continue losartan 12.5 mg daily.  - Continue spironolactone 25 mg daily. - Does not tolerate beta blockers due to symptomatic brady in past. - BP too low to titrate meds further - Long discussion about her situation and nearing end-stage HF. With age, COPD and tobacco use not candidate for transplant. ? VAD candidate though she is quite small. Have provided VAD info. Will arrange visit with her family and VAD coordinator to further discuss.   2. CAD s/p previous anterior MI:  Cath 8/15 nonobstructive CAD.   - Occasional CP in high-stress situations. Stable pattern. Cath 7/19 non-obstructive CAD.  - Stable  - Continue ASA and statin. Continue imdur.  3. COPD with ongoing tobacco abuse:  - Discussed smoking cessation. No change 4. PAD with Intermittent claudication: Medical therapy.  - Moderate bilateral calf claudication worse on the left side due to known SFA disease. This has progressed over past few months   - Had peripheral vascular catheterization 03/02/18 with no significant aortoiliac disease. Diffuse 60-70% disease affecting the left proximal and mid SFA with short occlusion  distally with reconstitution via collaterals from the profunda.  Three-vessel runoff below the knee.  - Continue Risk factor management and smoking cessation. No change. 5. Bilateral carotid stenosis - Followed by Dr. Johnsie Cancel. No change.  6. H/o symptomatic bradycardia:  - Stable off BB. Do not plan to re-challenge with concerns for low output. No change 7. Hyperlipidemia with intolerance of multiple cholesterol meds: - Tolerating Crestor 5 mg three times/week and Zetia 5mg  two times weekly.  - Per PCP. No change.  8. Depression/Anxiety:  - Stable.  9. PVCs - 20% burden in the hospital. No PVCs on EKG 07/06/18 - Continue mexilitene 150 mg BID. No change.   Glori Bickers, MD  12:47 PM

## 2018-09-15 ENCOUNTER — Ambulatory Visit (INDEPENDENT_AMBULATORY_CARE_PROVIDER_SITE_OTHER): Payer: Medicare Other

## 2018-09-15 ENCOUNTER — Telehealth: Payer: Self-pay

## 2018-09-15 DIAGNOSIS — Z95810 Presence of automatic (implantable) cardiac defibrillator: Secondary | ICD-10-CM

## 2018-09-15 DIAGNOSIS — I5022 Chronic systolic (congestive) heart failure: Secondary | ICD-10-CM

## 2018-09-15 NOTE — Telephone Encounter (Signed)
Remote ICM transmission received.  Attempted call to patient and left message, per DPR, to return call.  

## 2018-09-15 NOTE — Progress Notes (Signed)
EPIC Encounter for ICM Monitoring  Patient Name: [name] is a 67 y.o. female Date: 09/15/2018 Primary Care Physican: [place], [place] At Primary Cardiologist: Nishan/Bensimhon Electrophysiologist: Allred Dry Weight:Previous weight 112lbs      Attempted call to patient and able to reach.  Left message to return call.  Transmission reviewed.   Dr Haroldine Laws addressed abnormal fluid level on 09/14/2018   Thoracic impedance abnormal suggesting fluid accumulation starting 09/07/2018.  Fluid Index trending up since 08/09/2018 and > threshold 09/14/2018.   Prescribed: Torsemide 40 mg BID. Increase to 60 bid when fluid overloaded. (per Dr Bensimhon's office note 09/14/2018)   Torsemide 20 mg take 2 tablets(40 mg total) by mouthtwice a day.   Labs: 09/14/2018 Creatinine 1.31, BUN 16, Potassium 3.9, Sodium 135, eGFR 41-47 07/13/2018 Creatinine 1.18, BUN 16, Potassium 3.8, Sodium 136, eGFR 46-53 07/06/2018 Creatinine1.28, BUN20, Potassium4.3, Sodium127, WKGS81-10 06/28/2018 Creatinine1.38, BUN29, Potassium4.5, Sodium129, RPRX45-85  06/27/2018 Creatinine1.33, BUN24, Potassium4.1, Sodium129, eGFR40-46  06/26/2018 Creatinine1.43, BUN21, Potassium3.8, Sodium130, FYTW44-62  06/25/2018 Creatinine1.86, BUN21, Potassium4.0, Sodium131, MMNO17-71  06/24/2018 Creatinine1.50, BUN18, Potassium3.5, Sodium133, HAFB90-38  06/23/2018 Creatinine1.51, BUN18, Potassium3.4, Sodium136, BFXO32-91  06/22/2018 Creatinine1.47, BUN22, Potassium3.5, Sodium134, BTYO06-00  06/21/2018 Creatinine1.74, BUN32, Potassium3.9, Sodium135, KHTX77-41  06/20/2018 Creatinine2.14, BUN46, Potassium3.2, SELTRV202, BXID56-86  06/19/2018 Creatinine2.64, BUN56, Potassium4.6, Sodium131, HUOH72-90 06/18/2018 Creatinine2.35, BUN49, Potassium4.2, Sodium131, SXJD55-20  06/17/2018 Creatinine2.19, BUN46, Potassium3.9, Sodium130, EYEM33-61  06/16/2018  Creatinine1.88, BUN41, Potassium4.6, Sodium130, QAES97-53  06/15/2018 Creatinine1.63, BUN39, Potassium3.3, YYFRTM211, eGFR30-35 06/08/2018 Creatinine1.78, BUN35, Potassium4.3, ZNBVAP014, DCVU13-14 06/07/2018 Creatinine1.84, BUN39, Potassium3.5, Sodium131, HOOI75-79  06/06/2018 Creatinine1.92, BUN38, Potassium3.9, Sodium126, JKQA06-01  06/05/2018 Creatinine2.10, BUN36, Potassium4.8, VIFBPP943, EXMD47-09  06/04/2018 Creatinine1.85, BUN22, Potassium4.6, KHVFMB340, ZJQD64-38  06/03/2018 Creatinine2.04, BUN22, Potassium4.4, VKFMMC375, OHKG67-70  04/18/2018 Creatinine1.28, BUN21, Potassium4.0, HEKBTC481, YHTM93-11 A complete set of results can be found in Results Review.  Recommendations: Unable to reach. Follow-up plan: ICM clinic phone appointment on 09/22/2018 to recheck fluid levels.      Copy of ICM check sent to Dr. Rayann Heman and Dr Haroldine Laws.   3 month ICM trend: 09/15/2018    1 Year ICM trend:       Rosalene Billings, RN 09/15/2018 9:44 AM

## 2018-09-22 ENCOUNTER — Ambulatory Visit (INDEPENDENT_AMBULATORY_CARE_PROVIDER_SITE_OTHER): Payer: Self-pay

## 2018-09-22 ENCOUNTER — Telehealth: Payer: Self-pay | Admitting: Cardiology

## 2018-09-22 DIAGNOSIS — I5022 Chronic systolic (congestive) heart failure: Secondary | ICD-10-CM

## 2018-09-22 DIAGNOSIS — Z95810 Presence of automatic (implantable) cardiac defibrillator: Secondary | ICD-10-CM

## 2018-09-22 NOTE — Telephone Encounter (Signed)
LMOVM reminding pt to send remote transmission.   

## 2018-09-23 ENCOUNTER — Telehealth: Payer: Self-pay

## 2018-09-23 NOTE — Telephone Encounter (Signed)
Remote ICM transmission received.  Attempted call to patient regarding ICM remote transmission and no answer.  

## 2018-09-23 NOTE — Progress Notes (Signed)
EPIC Encounter for ICM Monitoring  Patient Name: [name] is a 67 y.o. female Date: 09/23/2018 Primary Care Physican: [place] [place] At Primary Cardiologist: Nishan/Bensimhon Electrophysiologist: Allred Dry Weight:Previous weight112lbs      Attempted call to patient and unable to reach.    Transmission reviewed.    Thoracic impedance abnormal suggesting fluid accumulation starting 08/09/2018 but starting to trend toward baseline.   Prescribed: Torsemide 20 mg take 2 tablets(40 mg total) by mouthtwice a day. Increase to 60 bid when fluid overloaded.(per Dr Bensimhon's office note 09/14/2018)   Labs: 09/14/2018 Creatinine 1.31, BUN 16, Potassium 3.9, Sodium 135, eGFR 41-47 07/13/2018 Creatinine 1.18, BUN 16, Potassium 3.8, Sodium 136, eGFR 46-53 07/06/2018 Creatinine1.28, BUN20, Potassium4.3, ORVIFB379, KFEX61-47 06/28/2018 Creatinine1.38, BUN29, Potassium4.5, Sodium129, WLKH57-47  06/27/2018 Creatinine1.33, BUN24, Potassium4.1, Sodium129, eGFR40-46  06/26/2018 Creatinine1.43, BUN21, Potassium3.8, Sodium130, BUYZ70-96  06/25/2018 Creatinine1.86, BUN21, Potassium4.0, Sodium131, KRCV81-84  06/24/2018 Creatinine1.50, BUN18, Potassium3.5, CRFVOH606, VPCH40-35   Recommendations:  Unable to reach.  Follow-up plan: ICM clinic phone appointment on 10/04/2018 to recheck fluid levels.     Copy of ICM check sent to Dr. Rayann Heman and Dr Haroldine Laws.   3 month ICM trend: 09/22/2018    1 Year ICM trend:       Rosalene Billings, RN 09/23/2018 11:51 AM

## 2018-09-27 ENCOUNTER — Other Ambulatory Visit: Payer: Self-pay | Admitting: Adult Health

## 2018-10-04 ENCOUNTER — Ambulatory Visit (INDEPENDENT_AMBULATORY_CARE_PROVIDER_SITE_OTHER): Payer: Medicare Other

## 2018-10-04 DIAGNOSIS — Z95810 Presence of automatic (implantable) cardiac defibrillator: Secondary | ICD-10-CM

## 2018-10-04 DIAGNOSIS — I5022 Chronic systolic (congestive) heart failure: Secondary | ICD-10-CM

## 2018-10-04 IMAGING — CT CT CHEST LUNG CANCER SCREENING LOW DOSE W/O CM
2 of 5 series · 14 of 40 positions shown, 17 images · non-contrast
Comparison: None.

CLINICAL DATA: 67-year-old female current smoker with 39 pack year
history of smoking. Lung cancer screening examination.

EXAM:
CT CHEST WITHOUT CONTRAST LOW-DOSE FOR LUNG CANCER SCREENING
TECHNIQUE: Multidetector CT imaging of the chest was performed following the
standard protocol without IV contrast.

[Series 4: lung 1.00 br60 · axial · 0.67mm/px · z∈[-907,-638]mm · 11 of 297 slices shown, 14 images]
[im 14/297  mediastinal]
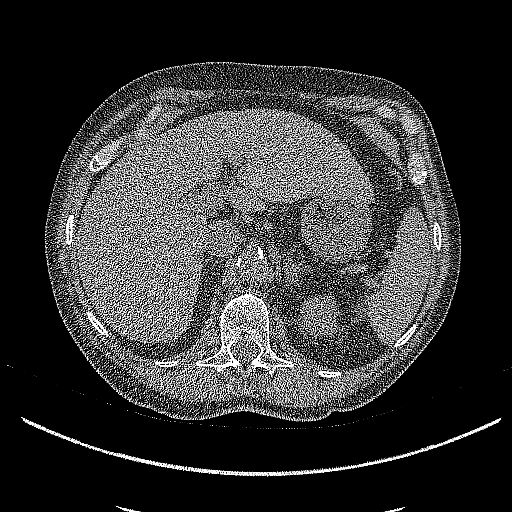
[im 14/297  lung]
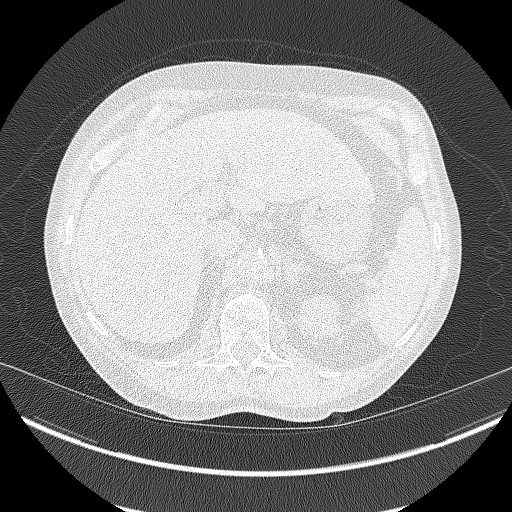
[im 41/297  lung]
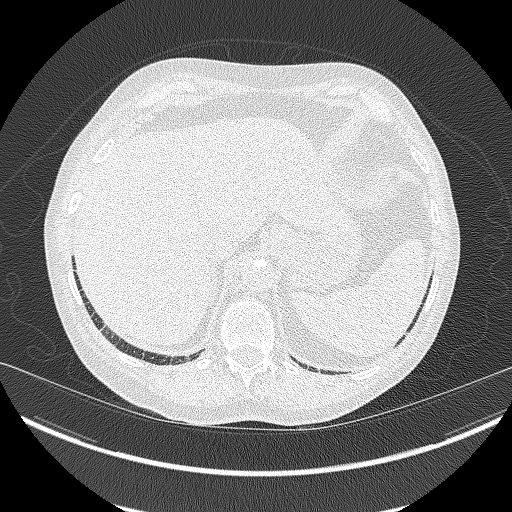
[im 68/297  lung]
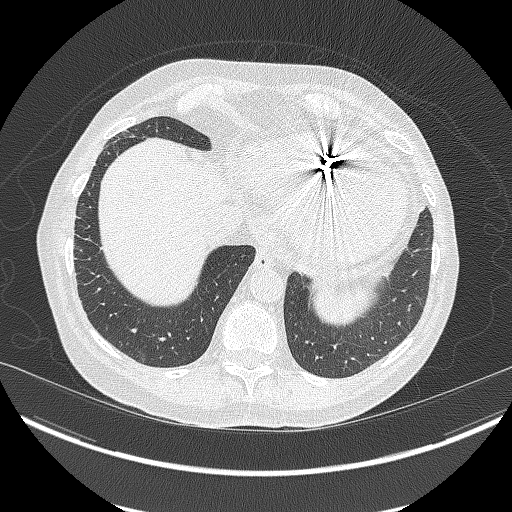
[im 95/297  lung]
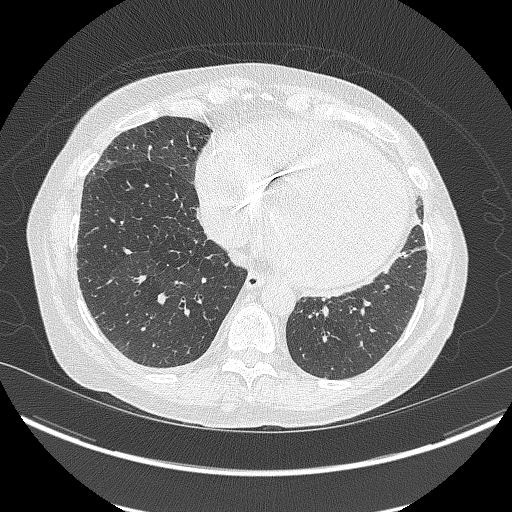
[im 122/297  mediastinal]
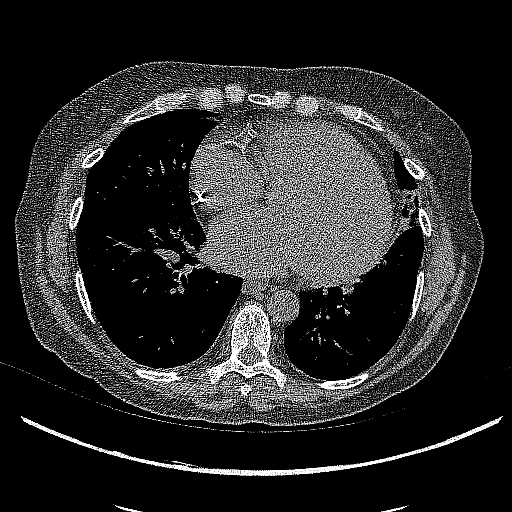
[im 122/297  lung]
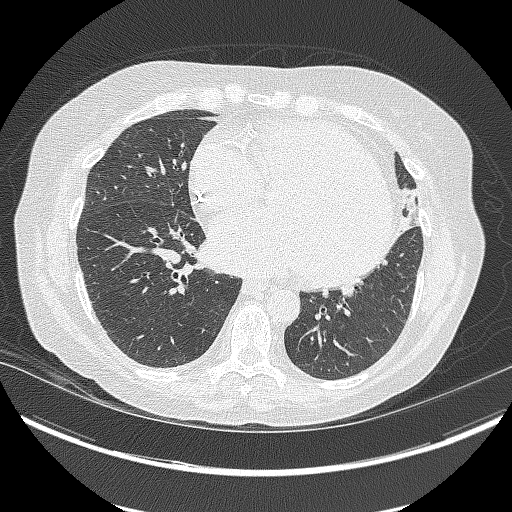
[im 149/297  lung]
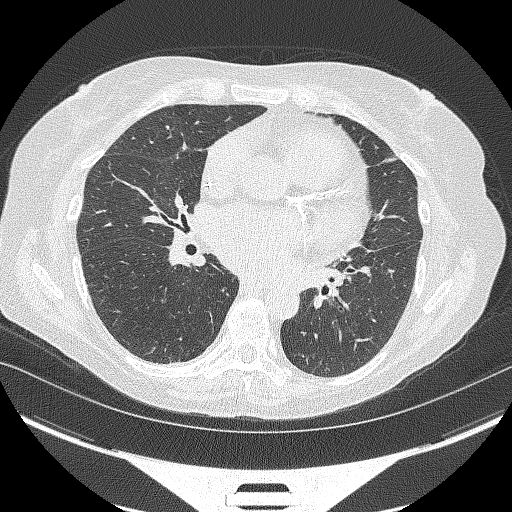
[im 175/297  lung]
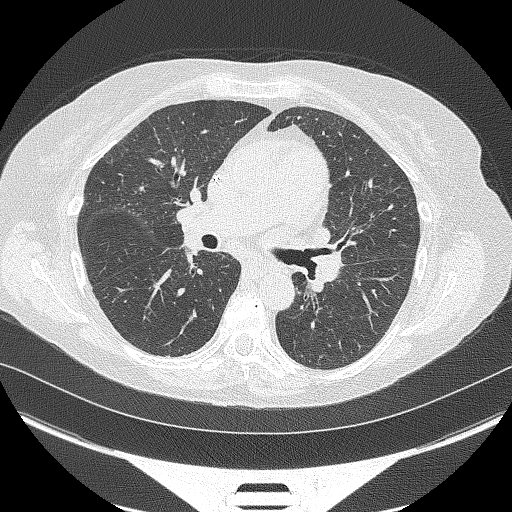
[im 202/297  lung]
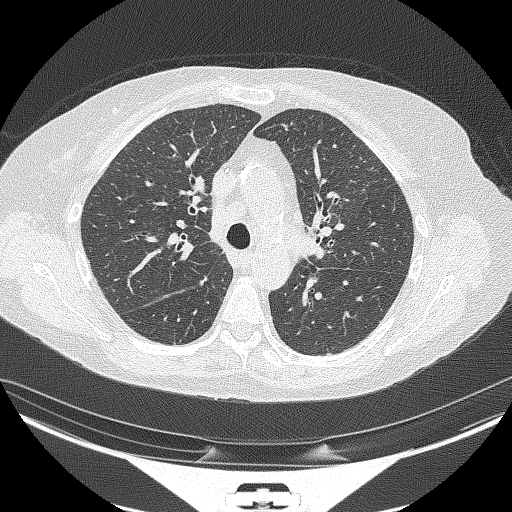
[im 229/297  mediastinal]
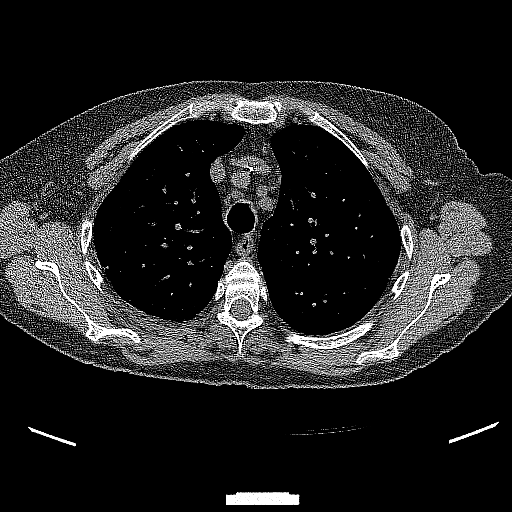
[im 229/297  lung]
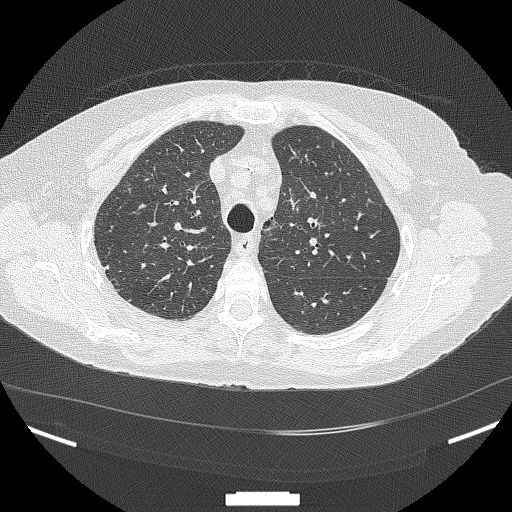
[im 256/297  lung]
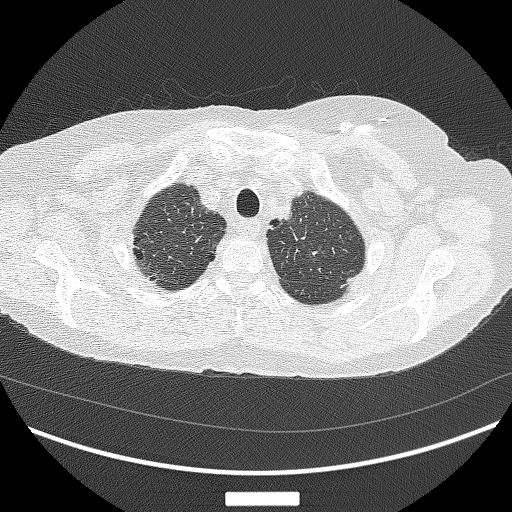
[im 283/297  lung]
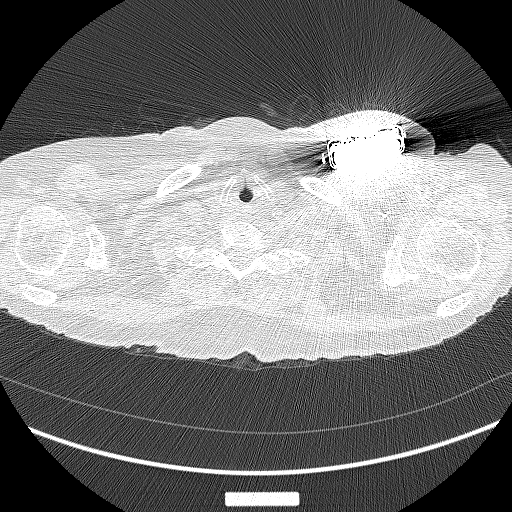

[Series 5: lung 1.00 br44 cor · coronal · 0.58mm/px · 3 of 252 slices shown]
[im 51/252  lung]
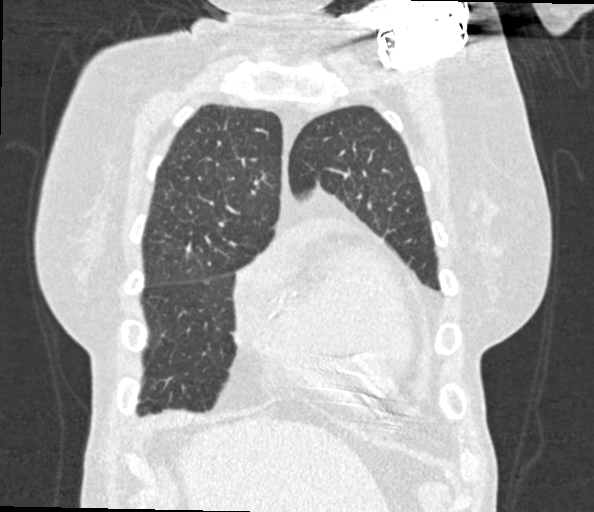
[im 101/252  lung]
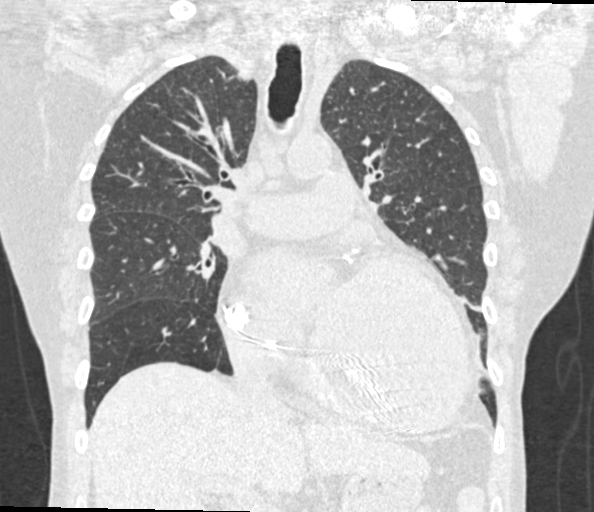
[im 151/252  lung]
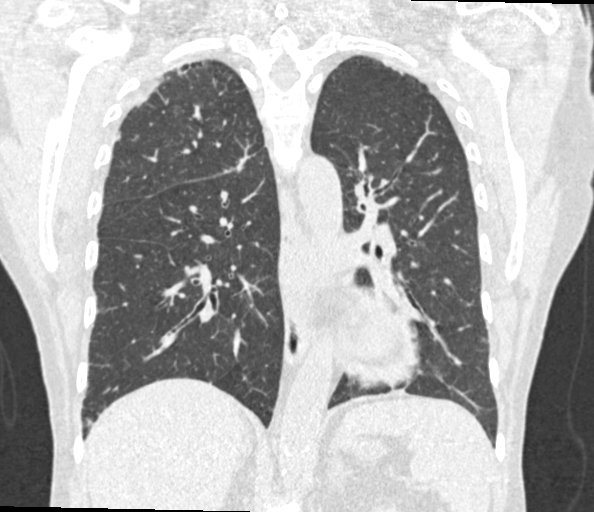

[14 of 40 positions shown; findings below may reference images not displayed]

FINDINGS: Cardiovascular: Heart size is mildly enlarged. There is no
significant pericardial fluid, thickening or pericardial
calcification. There is aortic atherosclerosis, as well as
atherosclerosis of the great vessels of the mediastinum and the
coronary arteries, including calcified atherosclerotic plaque in the
left main, left anterior descending, left circumflex and right
coronary arteries. Left-sided pacemaker/AICD with lead tip
terminating in the right ventricular apex. There appears to be some
calcification associated with the lead in the right atrium (axial
image 37 of series 2), likely chronic calcified thrombus.

Mediastinum/Nodes: No pathologically enlarged mediastinal or hilar
lymph nodes. Please note that accurate exclusion of hilar adenopathy
is limited on noncontrast CT scans. Esophagus is unremarkable in
appearance. No axillary lymphadenopathy.

Lungs/Pleura: Multiple small pulmonary nodules are noted throughout
both lungs, the largest of which is in the apex of the right upper
lobe (axial image 49 of series 4), with a volume derived mean
diameter of 3.6 mm. No larger more suspicious appearing pulmonary
nodules or masses are noted. No acute consolidative airspace
disease. No pleural effusions. Mild diffuse bronchial wall
thickening with mild centrilobular and paraseptal emphysema.

Upper Abdomen: Aortic atherosclerosis.

Musculoskeletal: There are no aggressive appearing lytic or blastic
lesions noted in the visualized portions of the skeleton.
IMPRESSION: 1. Lung-RADS 2S, benign appearance or behavior. Continue annual
screening with low-dose chest CT without contrast in 12 months.
2. The "S" modifier above refers to potentially clinically
significant non lung cancer related findings. Specifically, there is
aortic atherosclerosis, in addition to left main and 3 vessel
coronary artery disease. Please note that although the presence of
coronary artery calcium documents the presence of coronary artery
disease, the severity of this disease and any potential stenosis
cannot be assessed on this non-gated CT examination. Assessment for
potential risk factor modification, dietary therapy or pharmacologic
therapy may be warranted, if clinically indicated.
3. Mild diffuse bronchial wall thickening with mild centrilobular
and paraseptal emphysema; imaging findings suggestive of underlying
COPD

Aortic Atherosclerosis (QVWF9-8JL.L) and Emphysema (QVWF9-NZ6.W).

## 2018-10-05 ENCOUNTER — Telehealth: Payer: Self-pay

## 2018-10-05 NOTE — Progress Notes (Signed)
EPIC Encounter for ICM Monitoring  Patient Name: [name] is a 67 y.o. female Date: 10/05/2018 Primary Care Physican: [place], [place] [facility] Primary Cardiologist: Nishan/Bensimhon Electrophysiologist: Allred Dry Weight:Previous weight112lbs          Attempted call to patient and unable to reach.  Left message to return call.  Transmission reviewed.    Thoracic impedance returned to normal since last remote transmission on 09/22/2018.   Prescribed: Torsemide20 mg take 2 tablets(40 mg total) by mouthtwice a day. Increase to 60 bid when fluid overloaded.(per Dr Bensimhon's office note 09/14/2018)  Labs: 09/14/2018 Creatinine 1.31, BUN 16, Potassium 3.9, Sodium 135, eGFR 41-47 07/13/2018 Creatinine 1.18, BUN 16, Potassium 3.8, Sodium 136, eGFR 46-53 07/06/2018 Creatinine1.28, BUN20, Potassium4.3, Sodium127,eGFR42-48 06/28/2018 Creatinine1.38, BUN29, Potassium4.5, Sodium129,eGFR38-44  06/27/2018 Creatinine1.33, BUN24, Potassium4.1, Sodium129,eGFR40-46  06/26/2018 Creatinine1.43, BUN21, Potassium3.8, Sodium130,eGFR36-42  06/25/2018 Creatinine1.86, BUN21, Potassium4.0, Sodium131,eGFR27-31  06/24/2018 Creatinine1.50, BUN18, Potassium3.5, Sodium133,eGFR34-40   Recommendations: Unable to reach.  Follow-up plan: ICM clinic phone appointment on 10/17/2018.   Office appointment scheduled 10/11/2018 for VAD consult.    Copy of ICM check sent to Dr. Rayann Heman.   3 month ICM trend: 10/04/2018    1 Year ICM trend:       Terri Billings, RN 10/05/2018 8:47 AM

## 2018-10-05 NOTE — Telephone Encounter (Signed)
Remote ICM transmission received.  Attempted call to patient regarding ICM remote transmission and left message to return call   

## 2018-10-11 ENCOUNTER — Ambulatory Visit (HOSPITAL_COMMUNITY)
Admission: RE | Admit: 2018-10-11 | Discharge: 2018-10-11 | Disposition: A | Discharge status: Home / Self Care | Payer: Medicare Other | Source: Ambulatory Visit | Attending: Cardiology | Admitting: Cardiology

## 2018-10-11 DIAGNOSIS — I255 Ischemic cardiomyopathy: Secondary | ICD-10-CM | POA: Diagnosis not present

## 2018-10-11 DIAGNOSIS — J449 Chronic obstructive pulmonary disease, unspecified: Secondary | ICD-10-CM | POA: Diagnosis not present

## 2018-10-11 DIAGNOSIS — Z95810 Presence of automatic (implantable) cardiac defibrillator: Secondary | ICD-10-CM | POA: Diagnosis not present

## 2018-10-11 DIAGNOSIS — E785 Hyperlipidemia, unspecified: Secondary | ICD-10-CM | POA: Insufficient documentation

## 2018-10-11 DIAGNOSIS — I6523 Occlusion and stenosis of bilateral carotid arteries: Secondary | ICD-10-CM | POA: Diagnosis not present

## 2018-10-11 DIAGNOSIS — Z7982 Long term (current) use of aspirin: Secondary | ICD-10-CM | POA: Insufficient documentation

## 2018-10-11 DIAGNOSIS — M19042 Primary osteoarthritis, left hand: Secondary | ICD-10-CM | POA: Insufficient documentation

## 2018-10-11 DIAGNOSIS — Z79899 Other long term (current) drug therapy: Secondary | ICD-10-CM | POA: Insufficient documentation

## 2018-10-11 DIAGNOSIS — Z720 Tobacco use: Secondary | ICD-10-CM | POA: Diagnosis not present

## 2018-10-11 DIAGNOSIS — I5022 Chronic systolic (congestive) heart failure: Secondary | ICD-10-CM | POA: Diagnosis present

## 2018-10-11 DIAGNOSIS — Z888 Allergy status to other drugs, medicaments and biological substances status: Secondary | ICD-10-CM | POA: Insufficient documentation

## 2018-10-11 DIAGNOSIS — F17210 Nicotine dependence, cigarettes, uncomplicated: Secondary | ICD-10-CM | POA: Diagnosis not present

## 2018-10-11 DIAGNOSIS — Z7951 Long term (current) use of inhaled steroids: Secondary | ICD-10-CM | POA: Diagnosis not present

## 2018-10-11 DIAGNOSIS — I110 Hypertensive heart disease with heart failure: Secondary | ICD-10-CM | POA: Diagnosis not present

## 2018-10-11 DIAGNOSIS — I493 Ventricular premature depolarization: Secondary | ICD-10-CM

## 2018-10-11 DIAGNOSIS — F419 Anxiety disorder, unspecified: Secondary | ICD-10-CM | POA: Insufficient documentation

## 2018-10-11 DIAGNOSIS — I2510 Atherosclerotic heart disease of native coronary artery without angina pectoris: Secondary | ICD-10-CM

## 2018-10-11 DIAGNOSIS — M19041 Primary osteoarthritis, right hand: Secondary | ICD-10-CM | POA: Insufficient documentation

## 2018-10-11 DIAGNOSIS — F329 Major depressive disorder, single episode, unspecified: Secondary | ICD-10-CM | POA: Diagnosis not present

## 2018-10-11 DIAGNOSIS — I739 Peripheral vascular disease, unspecified: Secondary | ICD-10-CM | POA: Diagnosis not present

## 2018-10-11 DIAGNOSIS — I252 Old myocardial infarction: Secondary | ICD-10-CM | POA: Insufficient documentation

## 2018-10-11 LAB — COMPREHENSIVE METABOLIC PANEL
ALT: 31 U/L (ref 0–44)
ANION GAP: 9 (ref 5–15)
AST: 38 U/L (ref 15–41)
Albumin: 3.4 g/dL — ABNORMAL LOW (ref 3.5–5.0)
Alkaline Phosphatase: 136 U/L — ABNORMAL HIGH (ref 38–126)
BUN: 21 mg/dL (ref 8–23)
CALCIUM: 9.3 mg/dL (ref 8.9–10.3)
CHLORIDE: 101 mmol/L (ref 98–111)
CO2: 26 mmol/L (ref 22–32)
Creatinine, Ser: 1.43 mg/dL — ABNORMAL HIGH (ref 0.44–1.00)
GFR calc non Af Amer: 36 mL/min — ABNORMAL LOW (ref >60)
GFR, EST AFRICAN AMERICAN: 42 mL/min — AB (ref >60)
Glucose, Bld: 109 mg/dL — ABNORMAL HIGH (ref 70–99)
POTASSIUM: 4.2 mmol/L (ref 3.5–5.1)
Sodium: 136 mmol/L (ref 135–145)
Total Bilirubin: 0.8 mg/dL (ref 0.3–1.2)
Total Protein: 6.9 g/dL (ref 6.5–8.1)

## 2018-10-11 LAB — PREALBUMIN: Prealbumin: 23 mg/dL (ref 18–38)

## 2018-10-11 LAB — URIC ACID: Uric Acid, Serum: 9.6 mg/dL — ABNORMAL HIGH (ref 2.5–7.1)

## 2018-10-11 LAB — PROTIME-INR
INR: 0.99
Prothrombin Time: 13 seconds (ref 11.4–15.2)

## 2018-10-11 LAB — TSH: TSH: 2.586 u[IU]/mL (ref 0.350–4.500)

## 2018-10-11 LAB — HEMOGLOBIN A1C
Hgb A1c MFr Bld: 5.5 % (ref 4.8–5.6)
MEAN PLASMA GLUCOSE: 111.15 mg/dL

## 2018-10-11 LAB — T4, FREE: Free T4: 1.11 ng/dL (ref 0.82–1.77)

## 2018-10-11 LAB — MAGNESIUM: Magnesium: 2.3 mg/dL (ref 1.7–2.4)

## 2018-10-11 LAB — LACTATE DEHYDROGENASE: LDH: 188 U/L (ref 98–192)

## 2018-10-11 NOTE — Progress Notes (Signed)
[facility] EDUCATION NOTE:   VAD evaluation consent reviewed and signed by Barrie Dunker and designated caregiver Katherine Mantle.  Initial VAD teaching completed with pt and caregiver.   VAD educational packet including "HM III Patient Handbook", "HM III Left Ventricular Assist System" packet, and "[facility] HM III Patient Education" reviewed in detail with me and left at bedside for continued reference.  All questions answered regarding VAD implant, hospital stay, and what to expect when discharged home living with a heart pump. Pt identified Kandi as his primary caregiver and her sisters as back-up if this therapy should be deemed appropriate for her.  Explained need for 24/7 care when pt is  discharged home due to sternal precautions, adaptation to living on support, emotional support, consistent and meticulous exit site care and management, medication adherence and high volume of follow up visits with the [facility] after discharge;  both pt and caregiver verbalized understanding of above.   Explained that LVAD can be implanted for two indications in the setting of advanced left ventricular heart failure treatment:  1. Bridge to transplant - used for patients who cannot safely wait for heart transplant without this device.  Or   2. Destination therapy - used for patients until end of life or recovery of heart function.  Patient and caregiver(s) acknowledge that the indication at this point in time for LVAD therapy would be for DT due to age and current smoking.   Provided brief equipment overview and demonstration with HeartMate III training loop including discussion on the following:  a) power module  b) system controller  c) universal [employer]  d) battery clips  e) Batteries  f) Perc lock  g) Percutaneous lead   Demonstrated and discussed:  a) changing power source on system controller from tethered (power module) to untethered (battery) mode  b) changing power  source on system controller from untethered (battery) to tethered (power module) mode  c) how to monitor battery life both on the system controller and on each individual battery  d) changing batteries   Reviewed and supplied a copy of home inspection check list stressing that only three pronged grounded power outlets can be used for VAD equipment. Pt confirmed home has electrical outlets that will support the equipment.   Identified the following lifestyle modifications while living on MCS:   1. No driving for at least three months and then only if doctor gives permission to do so.  2. No tub baths while pump implanted, and shower only when doctor gives permission.  3. No swimming or submersion in water while implanted with pump.  4. No contact sports or engaging in jumping activities.  5. Always have a backup controller, charged spare batteries, and battery clips nearby at all times in case of emergency.  6. Call the doctor or hospital contact person if any change in how the pump sounds, feels, or works.  7. Plan to sleep only when connected to the power module.  8. Do not sleep on your stomach.  9. Keep a backup system controller, charged batteries, battery clips, and flashlight near you during sleep in case of electrical power outage.  10. Exit site care including dressing changes, monitoring for infection, and importance of keeping percutaneous lead stabilized at all times.    Extended the option to have one of our current patients and caregiver(s) come to talk with them about living on support} to assist with decision making.   Reviewed pictures of VAD drive line, site  care, dressing changes, and drive line stabilization including securement attachment device and abdominal binder. Discussed with pt and family that they will be required to purchase dressing supplies as long as patient has the VAD in place.   Reinforced need for 24 hour/7 day week caregivers. She will also  need to abide by sternal precautions with no lifting >10lbs, pushing, pulling and will need assistance with adapting to new life style with VAD equipment and care.   The patient understands that from this discussion it does not mean that they will receive the device, but that depends on an extensive evaluation process. The patient is aware of the fact that if at anytime they want to stop the evaluation process they can.  All questions have been answered at this time and contact information was provided should they encounter any further questions.  They are both agreeable at this time to the evaluation process and will move forward.   All labs drawn today. Plan is to schedule PFTs and CT chest outpatient. Pt is going to schedule mammogram at [facility] as this is where she has gone in the past.   Dr. Haroldine Laws met with pt and family and had extensive conversation about options and risk/benefits of heart pump as well as candidacy.   Total Session Time: 90 minutes  Tanda Rockers, RN VAD Coordinator   Office: [phone] 24/7 VAD Pager: [phone]

## 2018-10-11 NOTE — Progress Notes (Signed)
Advanced Heart Failure Note   Patient ID: [name], female   DOB: 10-26-1951, 67 y.o.   MRN: [id number]  Date:  10/11/2018   ID:  [name], DOB 03-15-1951, MRN [id number]  PCP:  [name] in [place] Electrophysiologist:  Dr. Thompson Grayer  General Cardiologist: Dr Johnsie Cancel HF: Dr Haroldine Laws   History of Present Illness: Terri DERENZO is a 67 y.o. female who was initially referred to the [facility] Clinic for evaluation for advanced therapies.   She has a hx of CAD, s/p prior Ant MI, s/p prior POBA to LAD, Dx, RCA, CHF due to ICM with high scar burden by MRI (12/09), s/p ICD, HTN, HL, depression, tobacco abuse.   Admitted 2/8 - 01/17/18 with Acute/bronchitis + Influenza A. Diuresed with IV lasix with A/C CHF. Discharge weight 128 lbs. Finished course of doxycyline as outpatient.   Admitted 6/28 through 06/08/2018 with volume overload. Diuresed with IV lasix and transitioned to 80 mg lasix daily.  [name] and entresto stopped at d/c with creatinine bump. Discharge weight 128 pounds.   Admitted 7/10 through 06/28/2018. Admitted with increase dyspnea and and abdominal pain. Required short term milrinone and diuresed with IV lasix. Transitioned to torsemide 40 mg daily. GI consulted for abdominal pain. HIDDA scan was negative. Also had 20% PVCs so mexiletine was started. Discharge weight 115 pounds.   CPX 9/19 severe HF limitation with pVO2 14.2 and slope 40.   Today she returns with her family to discuss advanced HF options. Little change since last visit. Continues to tire easily. Gets SOB with minimal exertion. No edema, orthopnea or PND. Does get som ab bloating. No CP. Taking all meds without difficulty. Still smoking.   CPX 08/10/18: FVC 1.96 (69%)    FEV1 1.46 (66%)     FEV1/FVC 74 (94%)     Resting HR: 83 Peak HR: 117  (77% age predicted max HR)  Peak Vo2 14.2 (64%) slope 40 Peak RER 1.11 OUES 0.81  ECHO 06/18/2018  - Left ventricle: Limited study. The cavity  size was moderately   dilated. Wall thickness was normal. The estimated ejection   fraction was 15%. Diffuse hypokinesis with regional variation.   Indeterminate diastolic function. - Aortic valve: Moderately calcified annulus. Trileaflet. - Mitral valve: Mildly calcified annulus. There was moderate   regurgitation. - Left atrium: The atrium was dilated. - Right ventricle: Pacer wire or catheter noted in right ventricle.   Systolic function was mildly reduced. - Right atrium: The atrium was dilated. - Tricuspid valve: There was moderate regurgitation. - Pulmonic valve: There was mild regurgitation. - Pulmonary arteries: PA peak pressure: 45 mm Hg (S). - Pericardium, extracardiac: There was no pericardial effusion.  ECHO 06/2018 EF 15% RV mildly dilated.  Echo 09/17/16 :LVEF 25-30%, Grade 1  ABI 7/14 R 0.93 L 0.95 MRI (12/09):  High scar burden, Inf and Apical AK, EF 24%.  Lexiscan Myoview (11/14): LV Ejection Fraction: 24%. Extensive scar in the inferior wall, apex and anterior wall. Minimal reversibility Carotid Doppler : August 2015 showed 60-79% bilateral ICA stenosis. No previous CVA. Cath 07/26/14 Left main: Calcified 40% mid to distal stenosis LAD: Long vessel wrapping the apex 20-30% plaque in midsection. Distal vessel is small.  LCX: Large OM-1 and large OM-2. 50% lesion in proximal OM-2 RCA: Dominant vessel. Calcified, eccentric 50-60% lesion in midsection . LV-gram done in the RAO projection: Ejection fraction = 25% Severe global HK of mid to distal LV. No MR.  LHC 06/22/18: Left Main  Calcified and relatively short left main with about 40% stenosis.  Left Anterior Descending  40% mid LAD stenosis.  Left Circumflex  50% stenosis proximal LCx just prior to stent. Patient stent proximal LCx.  Right Coronary Artery  40% mid RCA stenosis.   RHC 06/22/18: RA mean 13 RV 48/13 PA 49/16, mean 27 PCWP mean 19 LV 110/27 AO 108/57 Oxygen saturations: PA 68% AO  98% Cardiac Output (Fick) 5.04  Cardiac Index (Fick) 3.16 PVR 1.59 WU Cardiac Output (Thermo) 3.68 Cardiac Index (Thermo) 2.31  CPX 11/14 FVC 2.08 (67%)  FEV1 1.58 (66%)  FEV1/FVC 76%  Resting HR: 84 Peak HR: 142 (91% age predicted max HR) BP rest: 114/60 BP peak: 148/70 (IPE) Peak VO2: 15.0 (63% predicted peak VO2) VE/VCO2 slope: 32 OUES: 0.85 Peak RER: 1.33 Ventilatory Threshold: 12.1 (50.8% predicted peak VO2) VE/MVV: 60.1% O2pulse: 6 (75% predicted O2pulse)  LE angiography 03/02/18 showed no signficant aortoiliac disease. Left sided showed diffuse 60-70% disease affecting the proximal SFA followed by short occlusion distally with reconstitution via collaterals from the profunda and three-vessel runoff below the knee.   Labs (10/12):  K 3.2, creatinine 0.9, Hgb 13.5 Labs (9/14):    K 3.4, creatinine 1.0, pro BNP 1560, Hgb 11.8, HDL 25, LDL 60, ALT 29 Labs (10/14):  K 3.3=> 4.2, creatinine 1.2, BNP 1175 Labs (10/02/13) K 3.2 creatinine 0.9 Labs (12/13/13): LDL 134, AST 24, ALT 24, TC 221, TG 104 Labs (8/15): K 5.1, creatinine 1.1  Labs (11/15): LDL 79, HDL 67 Labs (3/16): K 3.9, creatinine 1.13 Lasbs (06/28/2018): K 4.5 Creatinine 1.38   Wt Readings from Last 3 Encounters:  09/14/18 54.9 kg (121 lb)  08/16/18 53 kg (116 lb 12.8 oz)  07/26/18 52.6 kg (116 lb)     Past Medical History:  Diagnosis Date  . Arthritis    hands  . Asthma   . CAD (coronary artery disease)    a. s/p prior Ant MI, b. s/p prior POBA to LAD, Dx, RCA,;  c.  LHC (10/12):  dLAD 40, pCFX 30, OM1 50, pRCA 30;  d.  Carlton Adam Myoview (11/14):  High risk study; inferior, anterior, apical defects; minimal reversibility toward the apex; consistent with prior infarct and very mild peri-infarct ischemia, EF 24%, inferior/apical/anterior akinesis  . Chronic systolic heart failure ([place])   . Depression   . H/O hiatal hernia   . HLD (hyperlipidemia)   . HTN (hypertension)   . Ischemic cardiomyopathy    MRI  (12/09):  High scar burden, Inf and Apical AK, EF 24%.;  Echo (10/14): EF 15%, Gr 2 DD, mild to mod MR, mod LAE, RVSF mildly reduced, mod RAE, severe TR, PASP 49-53 (mod pulmonary HTN)  . MI (myocardial infarction) ([place])   . Tobacco abuse     Current Outpatient Medications  Medication Sig Dispense Refill  . acetaminophen (TYLENOL ARTHRITIS PAIN) 650 MG CR tablet Take 650 mg by mouth every 8 (eight) hours as needed for pain.     Marland Kitchen albuterol (PROAIR HFA) 108 (90 Base) MCG/ACT inhaler Inhale 2 puffs into the lungs every 6 (six) hours as needed for wheezing.    Marland Kitchen aspirin 81 MG tablet Take 81 mg by mouth daily.      Marland Kitchen azelastine (ASTELIN) 0.1 % nasal spray Place 1 spray into both nostrils daily.     . budesonide-formoterol (SYMBICORT) 160-4.5 MCG/ACT inhaler Inhale 2 puffs into the lungs daily.     . citalopram (CELEXA) 20  MG tablet Take 20 mg by mouth daily.    . Cyanocobalamin ([employer] B12 PO) Take 1 capsule by mouth as directed.    . digoxin (LANOXIN) 0.125 MG tablet TAKE 1/2 TABLET BY MOUTH EVERY DAY 45 tablet 3  . ezetimibe (ZETIA) 10 MG tablet TAKE 1/2 TABLET TWICE A WEEK 5 tablet 11  . fexofenadine (ALLEGRA) 180 MG tablet Take 180 mg by mouth daily as needed for allergies.     . fish oil-omega-3 fatty acids 1000 MG capsule Take 2 g by mouth daily.     . fluticasone (FLONASE) 50 MCG/ACT nasal spray Place 2 sprays into both nostrils daily.    . IRON PO Take 1 capsule by mouth as directed.    . isosorbide mononitrate (IMDUR) 30 MG 24 hr tablet TAKE 1 TABLET BY MOUTH EVERY DAY 90 tablet 3  . losartan (COZAAR) 25 MG tablet TAKE 0.5 TABLETS (12.5 MG TOTAL) BY MOUTH 2 (TWO) TIMES DAILY. 60 tablet 11  . mexiletine (MEXITIL) 150 MG capsule Take 1 capsule (150 mg total) by mouth 2 (two) times daily. 60 capsule 11  . nitroGLYCERIN (NITROSTAT) 0.4 MG SL tablet Place 1 tablet (0.4 mg total) under the tongue every 5 (five) minutes as needed for chest pain. 25 tablet 3  . omeprazole (PRILOSEC) 40 MG capsule  Take 40 mg by mouth daily.     . polyethylene glycol (MIRALAX / GLYCOLAX) packet Take 17 g by mouth at bedtime.    . rosuvastatin (CRESTOR) 5 MG tablet Take 1 tablet by mouth on Monday, Wednesday, and Friday 45 tablet 3  . spironolactone (ALDACTONE) 25 MG tablet Take 1 tablet (25 mg total) by mouth daily. 30 tablet 0  . torsemide (DEMADEX) 20 MG tablet Take 2 tablets (40 mg total) by mouth 2 (two) times daily. 120 tablet 11   No current facility-administered medications for this encounter.    Allergies:    Allergies  Allergen Reactions  . Prednisone Shortness Of Breath, Swelling, Rash and Other (See Comments)    Sore throat, also  . Zebeta [Bisoprolol Fumarate] Other (See Comments)    Caused confusion  . Eggs Or Egg-Derived Products Other (See Comments)    Was told she is allergic to EGG WHITES  . Statins Other (See Comments)    Gradually tired with daily Lipitor, made pt feel badly (Pt can take Crestor 5 mg 4 times per week and Zetia 5 mg two times a week)   Social History:  The patient  reports that she has been smoking cigarettes. She has never used smokeless tobacco. She reports that she does not drink alcohol or use drugs.   Review of systems complete and found to be negative unless listed in HPI.    Wt Readings from Last 3 Encounters:  09/14/18 54.9 kg (121 lb)  08/16/18 53 kg (116 lb 12.8 oz)  07/26/18 52.6 kg (116 lb)    PHYSICAL EXAM: General:  Walked into clinic No resp difficulty HEENT: normal Neck: supple. no JVD. Carotids 2+ bilat; no bruits. No lymphadenopathy or thryomegaly appreciated. Cor: PMI laterally displaced. Regular rate & rhythm. No rubs, gallops or murmurs. Lungs: clear with decreased BS throughout Abdomen: soft, nontender, nondistended. No hepatosplenomegaly. No bruits or masses. Good bowel sounds. Extremities: no cyanosis, clubbing, rash, edema Neuro: alert & orientedx3, cranial nerves grossly intact. moves all 4 extremities w/o difficulty. Affect  pleasant  ASSESSMENT AND PLAN:  1.  Chronic Systolic CHF  due to [facility] with mild RV dysfunction s/p  [employer] ICD.  - Echo 06/2018 EF 15%   - Chronic NYHA III-IIIB symtoms - CPX 9/19 with severe functional limitation due predominantly to  HF. - Volume status stable. Continue torsemide 40 mg BID. Can increase to 60 bid when fluid overloaded.  - Continue digoxin 0.0625 mg daily.  - Continue losartan 12.5 mg daily.  - Continue spironolactone 25 mg daily. - Does not tolerate beta blockers due to symptomatic brady in past. - BP too low to titrate meds further - Long discussion with her and her family about her situation. She is nearing end-stage HF. With age, COPD and tobacco use and PAD not candidate for transplant. We had a lung discussion about pros/cons for VAD as well as what criteria we use to approve candidate. VAD coordinator showed them equipment and discussed life with VAD and outcomes. Main barriers include her small size and ongoing tobacco use. She wil discuss with her family. If they decide to proceed with w/u will start with PFTs/DLCO. Reinforced absolute need to quit smoking. 2. CAD s/p previous anterior MI:  Cath 8/15 nonobstructive CAD.   - Occasional CP in high-stress situations. But none currently. Stable pattern. Cath 7/19 non-obstructive CAD.  - Continue ASA and statin. Continue imdur.  3. COPD with ongoing tobacco abuse:  - As above, discussed smoking cessation. No change 4. PAD with Intermittent claudication: Medical therapy.  - Moderate bilateral calf claudication worse on the left side due to known SFA disease. This has progressed over past few months   - Had peripheral vascular catheterization 03/02/18 with no significant aortoiliac disease. Diffuse 60-70% disease affecting the left proximal and mid SFA with short occlusion distally with reconstitution via collaterals from the profunda.  Three-vessel runoff below the knee.  - Continue Risk factor management and smoking  cessation. No change. 5. Bilateral carotid stenosis - Followed by Dr. Johnsie Cancel. No change.  6. H/o symptomatic bradycardia:  - Stable off BB. Do not plan to re-challenge with concerns for low output. No change 7. Hyperlipidemia with intolerance of multiple cholesterol meds: - Tolerating Crestor 5 mg three times/week and Zetia 5mg  two times weekly.  - Per PCP. No change.  8. Depression/Anxiety:  - Stable.  9. PVCs - 20% burden in the hospital. No PVCs on EKG 07/06/18 - Continue mexilitene 150 mg BID. No change.   Glori Bickers, MD  2:43 PM

## 2018-10-11 NOTE — Addendum Note (Signed)
Encounter addended by: Jolaine Artist, MD on: 10/11/2018 2:51 PM  Actions taken: Visit diagnoses modified, LOS modified, Sign clinical note

## 2018-10-11 NOTE — Addendum Note (Signed)
Encounter addended by: Christinia Gully, RN on: 10/11/2018 12:07 PM  Actions taken: Sign clinical note

## 2018-10-12 LAB — LUPUS ANTICOAGULANT PANEL
DRVVT: 30.3 s (ref 0.0–47.0)
PTT Lupus Anticoagulant: 30.7 s (ref 0.0–51.9)

## 2018-10-12 LAB — HEPATITIS B SURFACE ANTIBODY,QUALITATIVE: Hep B S Ab: NONREACTIVE

## 2018-10-12 LAB — HEPATITIS B SURFACE ANTIGEN: Hepatitis B Surface Ag: NEGATIVE

## 2018-10-12 LAB — ANTITHROMBIN PANEL
ANTITHROMB III FUNC: 133 % (ref 75–135)
AT III AG PPP IMM-ACNC: 115 % (ref 72–124)

## 2018-10-12 LAB — HEPATITIS B CORE ANTIBODY, TOTAL: Hep B Core Total Ab: NEGATIVE

## 2018-10-14 ENCOUNTER — Telehealth (HOSPITAL_COMMUNITY): Payer: Self-pay | Admitting: *Deleted

## 2018-10-14 ENCOUNTER — Other Ambulatory Visit (HOSPITAL_COMMUNITY): Payer: Self-pay | Admitting: *Deleted

## 2018-10-14 DIAGNOSIS — Z01818 Encounter for other preprocedural examination: Secondary | ICD-10-CM

## 2018-10-14 NOTE — Telephone Encounter (Signed)
Spoke with patient regarding PFT and venous duplex tests scheduled for 10/25/18 at 10am/11am. Instructed patient to check in at admitting prior to going to PFT lab. Educated patient that she is not to smoke/drink caffeine/or use inhalers 4 hours prior to PFTs. Patient verbalized understanding.   Emerson Monte RN [place] Coordinator  Office: [phone]  24/7 Pager: [phone]

## 2018-10-17 ENCOUNTER — Ambulatory Visit (INDEPENDENT_AMBULATORY_CARE_PROVIDER_SITE_OTHER): Payer: Medicare Other

## 2018-10-17 DIAGNOSIS — Z95810 Presence of automatic (implantable) cardiac defibrillator: Secondary | ICD-10-CM | POA: Diagnosis not present

## 2018-10-17 DIAGNOSIS — I5022 Chronic systolic (congestive) heart failure: Secondary | ICD-10-CM | POA: Diagnosis not present

## 2018-10-17 LAB — FACTOR 5 LEIDEN

## 2018-10-17 NOTE — Progress Notes (Signed)
EPIC Encounter for ICM Monitoring  Patient Name: [name] is a 67 y.o. female Date: 10/17/2018 Primary Care Physican: [place] [place] At Primary Cardiologist: Nishan/Bensimhon Electrophysiologist: Allred Last Weight: 112lbs Today's Weight:  112 lbs       Heart Failure questions reviewed, pt symptomatic with bloating.  Advanced therapies discussed with Dr Haroldine Laws 10/11/2018.   Thoracic impedance abnormal suggesting fluid accumulation.   Prescribed: Torsemide20 mg take 2 tablets(40 mg total) by mouthtwice a day. Increase to 60 bid when fluid overloaded.(per Dr Bensimhon's office note 10/11/2018)  Labs: 10/11/2018 Creatinine 1.43, BUN 21, Potassium 4.2, Sodium 136, eGFR 36-42 09/14/2018 Creatinine 1.31, BUN 16, Potassium 3.9, Sodium 135, eGFR 41-47 07/13/2018 Creatinine 1.18, BUN 16, Potassium 3.8, Sodium 136, eGFR 46-53 07/06/2018 Creatinine1.28, BUN20, Potassium4.3, Sodium127,eGFR42-48 06/28/2018 Creatinine1.38, BUN29, Potassium4.5, Sodium129,eGFR38-44  06/27/2018 Creatinine1.33, BUN24, Potassium4.1, Sodium129,eGFR40-46  06/26/2018 Creatinine1.43, BUN21, Potassium3.8, Sodium130,eGFR36-42  06/25/2018 Creatinine1.86, BUN21, Potassium4.0, Sodium131,eGFR27-31  06/24/2018 Creatinine1.50, BUN18, Potassium3.5, Sodium133,eGFR34-40  Recommendations:  She increased Torsemide to 60 mg bid x 2 days and will continue for the next couple of days as instructed by Dr Haroldine Laws at office visit.  Follow-up plan: ICM clinic phone appointment on 10/27/2018 to recheck fluid levels.     Copy of ICM check sent to Dr. Rayann Heman and Dr Haroldine Laws.   3 month ICM trend: 10/17/2018    1 Year ICM trend:       Rosalene Billings, RN 10/17/2018 9:16 AM

## 2018-10-18 ENCOUNTER — Other Ambulatory Visit: Payer: Self-pay | Admitting: Internal Medicine

## 2018-10-18 DIAGNOSIS — Z1231 Encounter for screening mammogram for malignant neoplasm of breast: Secondary | ICD-10-CM

## 2018-10-24 ENCOUNTER — Telehealth (HOSPITAL_COMMUNITY): Payer: Self-pay

## 2018-10-24 NOTE — Telephone Encounter (Signed)
Pt states her weight on 06/15/18 (when she got out of hospital) was 112 lbs, and now her weight is 121.5 lbs. Pt also states that she has been taking the extra torsemide for a week and a half.

## 2018-10-24 NOTE — Telephone Encounter (Signed)
Pt called to state that she has edema in her stomach along with SOB and nausea. Pt also states that she is currently taking torsemide 20 mg (3 tabs (60mg ) twice a day) after Dan increased dose and it is not helping. Please advise.

## 2018-10-24 NOTE — Telephone Encounter (Signed)
Can she come for BMET, BNP, and ReDs vest tomorrow am with RN visit? I'm here and will take a look at her.    Legrand Como [address] [place], PA-C 10/24/2018 4:02 PM

## 2018-10-24 NOTE — Telephone Encounter (Signed)
Is her weight up? How long has she been taking extra torsemide?

## 2018-10-24 NOTE — Telephone Encounter (Signed)
See below, please

## 2018-10-24 NOTE — Telephone Encounter (Signed)
Pt reports she may have metolazone at home, if she can find bottle she will take one tonight however she would still like to come in for lab appt after PFT  Nurse visit 11/19 @12 

## 2018-10-25 ENCOUNTER — Ambulatory Visit (HOSPITAL_COMMUNITY)
Admission: RE | Admit: 2018-10-25 | Discharge: 2018-10-25 | Disposition: A | Discharge status: Home / Self Care | Payer: Medicare Other | Source: Ambulatory Visit | Attending: Cardiology | Admitting: Cardiology

## 2018-10-25 ENCOUNTER — Ambulatory Visit (HOSPITAL_BASED_OUTPATIENT_CLINIC_OR_DEPARTMENT_OTHER)
Admission: RE | Admit: 2018-10-25 | Discharge: 2018-10-25 | Disposition: A | Discharge status: Home / Self Care | Payer: Medicare Other | Source: Ambulatory Visit | Attending: Internal Medicine | Admitting: Internal Medicine

## 2018-10-25 ENCOUNTER — Ambulatory Visit (HOSPITAL_COMMUNITY)
Admission: RE | Admit: 2018-10-25 | Discharge: 2018-10-25 | Disposition: A | Discharge status: Home / Self Care | Payer: Medicare Other | Source: Ambulatory Visit | Attending: Internal Medicine | Admitting: Internal Medicine

## 2018-10-25 DIAGNOSIS — Z01818 Encounter for other preprocedural examination: Secondary | ICD-10-CM | POA: Insufficient documentation

## 2018-10-25 LAB — PULMONARY FUNCTION TEST
DL/VA % pred: 81 %
DL/VA: 3.86 ml/min/mmHg/L
DLCO unc % pred: 52 %
DLCO unc: 12.31 ml/min/mmHg
FEF 25-75 Post: 1.15 L/sec
FEF 25-75 Pre: 0.9 L/sec
FEF2575-%Change-Post: 28 %
FEF2575-%PRED-PRE: 46 %
FEF2575-%Pred-Post: 60 %
FEV1-%CHANGE-POST: 6 %
FEV1-%PRED-POST: 54 %
FEV1-%PRED-PRE: 51 %
FEV1-PRE: 1.16 L
FEV1-Post: 1.23 L
FEV1FVC-%CHANGE-POST: 5 %
FEV1FVC-%Pred-Pre: 99 %
FEV6-%Change-Post: 0 %
FEV6-%Pred-Post: 53 %
FEV6-%Pred-Pre: 53 %
FEV6-PRE: 1.51 L
FEV6-Post: 1.53 L
FEV6FVC-%PRED-PRE: 104 %
FEV6FVC-%Pred-Post: 104 %
FVC-%Change-Post: 0 %
FVC-%Pred-Post: 51 %
FVC-%Pred-Pre: 51 %
FVC-POST: 1.53 L
FVC-Pre: 1.52 L
POST FEV1/FVC RATIO: 80 %
PRE FEV6/FVC RATIO: 100 %
Post FEV6/FVC ratio: 100 %
Pre FEV1/FVC ratio: 76 %
RV % pred: 94 %
RV: 2.03 L
TLC % PRED: 75 %
TLC: 3.78 L

## 2018-10-25 MED ORDER — ALBUTEROL SULFATE (2.5 MG/3ML) 0.083% IN NEBU
2.5000 mg | INHALATION_SOLUTION | Freq: Once | RESPIRATORY_TRACT | Status: AC
  Administered 2018-10-25: 2.5 mg via RESPIRATORY_TRACT

## 2018-10-25 NOTE — Progress Notes (Signed)
ReDS Vest - 10/25/18 1300      ReDS Vest   MR   No    Fitting Posture  Sitting    Ruler Value  25    ReDS Value  34

## 2018-10-25 NOTE — Progress Notes (Signed)
*  Preliminary Results* Bilateral lower extremity venous duplex completed. Bilateral lower extremities are negative for deep vein thrombosis. There is no evidence of Baker's cyst bilaterally.  10/25/2018 11:16 AM  [name] Dawna Part

## 2018-10-27 ENCOUNTER — Telehealth: Payer: Self-pay

## 2018-10-27 ENCOUNTER — Ambulatory Visit (INDEPENDENT_AMBULATORY_CARE_PROVIDER_SITE_OTHER): Payer: Medicare Other

## 2018-10-27 DIAGNOSIS — Z95810 Presence of automatic (implantable) cardiac defibrillator: Secondary | ICD-10-CM

## 2018-10-27 DIAGNOSIS — I5022 Chronic systolic (congestive) heart failure: Secondary | ICD-10-CM

## 2018-10-27 NOTE — Progress Notes (Signed)
EPIC Encounter for ICM Monitoring  Patient Name: [name] is a 67 y.o. female Date: 10/27/2018 Primary Care Physican: [place] [place] [facility] Primary Cardiologist: Nishan/Bensimhon Electrophysiologist: Allred Last Weight: 112lbs Today's Weight:  112 lbs                                                     Attempted call to patient and unable to reach.  Left detailed message, per DPR, regarding transmission.  Transmission reviewed.    Thoracic impedance returned to normal since taking extra Torsemide x 3 days.   Prescribed: Torsemide20 mg take 2 tablets(40 mg total) by mouthtwice a day. Increase to 60 bid when fluid overloaded.(per Dr Bensimhon's office note 10/11/2018)  Labs: 10/11/2018 Creatinine 1.43, BUN 21, Potassium 4.2, Sodium 136, eGFR 36-42 09/14/2018 Creatinine 1.31, BUN 16, Potassium 3.9, Sodium 135, eGFR 41-47 07/13/2018 Creatinine 1.18, BUN 16, Potassium 3.8, Sodium 136, eGFR 46-53 07/06/2018 Creatinine1.28, BUN20, Potassium4.3, Sodium127,eGFR42-48 06/28/2018 Creatinine1.38, BUN29, Potassium4.5, Sodium129,eGFR38-44  06/27/2018 Creatinine1.33, BUN24, Potassium4.1, Sodium129,eGFR40-46  06/26/2018 Creatinine1.43, BUN21, Potassium3.8, Sodium130,eGFR36-42  06/25/2018 Creatinine1.86, BUN21, Potassium4.0, Sodium131,eGFR27-31  06/24/2018 Creatinine1.50, BUN18, Potassium3.5, Sodium133,eGFR34-40  Recommendations: Left voice mail with ICM number and encouraged to call if experiencing any fluid symptoms.  Follow-up plan: ICM clinic phone appointment on 11/17/2018.    Copy of ICM check sent to Dr. Rayann Heman.   3 month ICM trend: 10/27/2018    1 Year ICM trend:       Rosalene Billings, RN 10/27/2018 2:36 PM

## 2018-10-27 NOTE — Telephone Encounter (Signed)
Remote ICM transmission received.  Attempted call to patient regarding ICM remote transmission and left detailed message, per DPR, with next ICM remote transmission date of 11/17/2018.  Advised to return call for any fluid symptoms or questions.   

## 2018-11-01 ENCOUNTER — Telehealth (HOSPITAL_COMMUNITY): Payer: Self-pay | Admitting: *Deleted

## 2018-11-01 ENCOUNTER — Other Ambulatory Visit (HOSPITAL_COMMUNITY): Payer: Self-pay | Admitting: *Deleted

## 2018-11-01 DIAGNOSIS — F17210 Nicotine dependence, cigarettes, uncomplicated: Secondary | ICD-10-CM

## 2018-11-01 DIAGNOSIS — I5022 Chronic systolic (congestive) heart failure: Secondary | ICD-10-CM

## 2018-11-01 MED ORDER — NICOTINE POLACRILEX 4 MG MT GUM: 4.0000 mg | CHEWING_GUM | OROMUCOSAL | 0 refills | Status: AC | PRN

## 2018-11-01 MED ORDER — NICOTINE 14 MG/24HR TD PT24: 14.0000 mg | MEDICATED_PATCH | TRANSDERMAL | 0 refills | Status: DC

## 2018-11-01 NOTE — Telephone Encounter (Signed)
Spoke with patient regarding PFT results. She states that she is now smoking only 5 cigarettes per day. Made her aware, that per Dr. Cyndia Bent she would need to quit smoking completely prior to VAD, and we would need to repeat PFTs after smoking cessation. Patient verbalized understanding. Will order nicotine patch and gum to assist with smoking cessation.   Emerson Monte RN [place] Coordinator  Office: [phone]  24/7 Pager: [phone]

## 2018-11-01 NOTE — Telephone Encounter (Signed)
Left message for patient to call back regarding PFT test results.   Emerson Monte RN [place] Coordinator  Office: [phone]  24/7 Pager: [phone]

## 2018-11-02 ENCOUNTER — Ambulatory Visit (INDEPENDENT_AMBULATORY_CARE_PROVIDER_SITE_OTHER): Payer: Medicare Other

## 2018-11-02 ENCOUNTER — Telehealth (HOSPITAL_COMMUNITY): Payer: Self-pay

## 2018-11-02 DIAGNOSIS — I255 Ischemic cardiomyopathy: Secondary | ICD-10-CM

## 2018-11-02 DIAGNOSIS — I5022 Chronic systolic (congestive) heart failure: Secondary | ICD-10-CM

## 2018-11-02 NOTE — Progress Notes (Signed)
Remote ICD transmission.   

## 2018-11-02 NOTE — Telephone Encounter (Signed)
Spoke with pt. Appointment made. Pt aware, agreeable and verbalizes understanding.

## 2018-11-04 ENCOUNTER — Other Ambulatory Visit: Payer: Self-pay | Admitting: Internal Medicine

## 2018-11-10 ENCOUNTER — Telehealth (HOSPITAL_COMMUNITY): Payer: Self-pay

## 2018-11-10 NOTE — Telephone Encounter (Signed)
Please call and instruct to increased torsemide to 60 mg twice a day for 2 days then cut back to 40 mg twice a day.   Please ask her to call back tomorrow if she feels worse or if dyspnea is worse go to [facility] for an evaluation. Darrick Grinder NP-C  4:07 PM

## 2018-11-10 NOTE — Telephone Encounter (Signed)
Left VM on pt's phone.

## 2018-11-10 NOTE — Telephone Encounter (Signed)
Pt called to report she is retaining fluid. Weight went from 114-121.5 in 1 week. Pt also reports some nausea and SOB. Pt is currently taking torsemide 20 mg (Take 2 tablets (40 mg total) by mouth 2 (two) times daily), Please advise.

## 2018-11-14 NOTE — Progress Notes (Signed)
Advanced Heart Failure Note   Patient ID: TIAN DAVISON, female   DOB: 06/06/1951, 67 y.o.   MRN: [id number]  Date:  11/21/2018   ID:  DORTHIE SANTINI, DOB 30-Jan-1951, MRN [id number]  PCP:  [name] in [place] Electrophysiologist:  Dr. Thompson Grayer  General Cardiologist: Dr Johnsie Cancel HF: Dr Haroldine Laws   History of Present Illness: ICELYNN ONKEN is a 67 y.o. female who was initially referred to the HF Clinic for evaluation for advanced therapies.   She has a hx of CAD, s/p prior Ant MI, s/p prior POBA to LAD, Dx, RCA, CHF due to ICM with high scar burden by MRI (12/09), s/p ICD, HTN, HL, depression, tobacco abuse.   Admitted 2/8 - 01/17/18 with Acute/bronchitis + Influenza A. Diuresed with IV lasix with A/C CHF. Discharge weight 128 lbs. Finished course of doxycyline as outpatient.   Admitted 6/28 through 06/08/2018 with volume overload. Diuresed with IV lasix and transitioned to 80 mg lasix daily.  [name] and entresto stopped at d/c with creatinine bump. Discharge weight 128 pounds.   Admitted 7/10 through 06/28/2018. Admitted with increase dyspnea and and abdominal pain. Required short term milrinone and diuresed with IV lasix. Transitioned to torsemide 40 mg daily. GI consulted for abdominal pain. HIDDA scan was negative. Also had 20% PVCs so mexiletine was started. Discharge weight 115 pounds.   CPX 9/19 severe HF limitation with pVO2 14.2 and slope 40.   Today she returns for HF follow up. Last visit she was instructed to increase torsemide to 60 mg BID as needed. Dr Haroldine Laws had long discussion about possibility of LVAD. She has called several times since appointment with weight gain and SOB and was instructed to take torsemide 60 mg BID x 2 days. Overall doing well. She says she increased torsemide did not help her symptoms and weight gain, but she took metolazone x1 and symptoms improved. Weights down from 121 to 114 lbs at home. Has occasional SOB. No problems walking from  waiting room. Takes breaks in grocery store. She currently has a sinus and ear infection and has taken 2 rounds of abx for it. No further edema. Sleeps on 2 pillows at baseline. No CP. Has some dizziness that started with ear infection. Her cat passed away recently and she has been feeling down. Battling with depression due to this and talking about LVAD. She is still unsure about LVAD at this time. Still smoking 5 cigarettes/day. Had PFTs in November. Taking all medications.  CPX 08/10/18: FVC 1.96 (69%)    FEV1 1.46 (66%)     FEV1/FVC 74 (94%)     Resting HR: 83 Peak HR: 117  (77% age predicted max HR)  Peak Vo2 14.2 (64%) slope 40 Peak RER 1.11 OUES 0.81  ECHO 06/18/2018  - Left ventricle: Limited study. The cavity size was moderately   dilated. Wall thickness was normal. The estimated ejection   fraction was 15%. Diffuse hypokinesis with regional variation.   Indeterminate diastolic function. - Aortic valve: Moderately calcified annulus. Trileaflet. - Mitral valve: Mildly calcified annulus. There was moderate   regurgitation. - Left atrium: The atrium was dilated. - Right ventricle: Pacer wire or catheter noted in right ventricle.   Systolic function was mildly reduced. - Right atrium: The atrium was dilated. - Tricuspid valve: There was moderate regurgitation. - Pulmonic valve: There was mild regurgitation. - Pulmonary arteries: PA peak pressure: 45 mm Hg (S). - Pericardium, extracardiac: There was no pericardial effusion.  ECHO  06/2018 EF 15% RV mildly dilated.  Echo 09/17/16 :LVEF 25-30%, Grade 1  ABI 7/14 R 0.93 L 0.95 MRI (12/09):  High scar burden, Inf and Apical AK, EF 24%.  Lexiscan Myoview (11/14): LV Ejection Fraction: 24%. Extensive scar in the inferior wall, apex and anterior wall. Minimal reversibility Carotid Doppler : August 2015 showed 60-79% bilateral ICA stenosis. No previous CVA. Cath 07/26/14 Left main: Calcified 40% mid to distal stenosis LAD: Long  vessel wrapping the apex 20-30% plaque in midsection. Distal vessel is small.  LCX: Large OM-1 and large OM-2. 50% lesion in proximal OM-2 RCA: Dominant vessel. Calcified, eccentric 50-60% lesion in midsection . LV-gram done in the RAO projection: Ejection fraction = 25% Severe global HK of mid to distal LV. No MR.   LHC 06/22/18: Left Main  Calcified and relatively short left main with about 40% stenosis.  Left Anterior Descending  40% mid LAD stenosis.  Left Circumflex  50% stenosis proximal LCx just prior to stent. Patient stent proximal LCx.  Right Coronary Artery  40% mid RCA stenosis.   RHC 06/22/18: RA mean 13 RV 48/13 PA 49/16, mean 27 PCWP mean 19 LV 110/27 AO 108/57 Oxygen saturations: PA 68% AO 98% Cardiac Output (Fick) 5.04  Cardiac Index (Fick) 3.16 PVR 1.59 WU Cardiac Output (Thermo) 3.68 Cardiac Index (Thermo) 2.31  CPX 11/14 FVC 2.08 (67%)  FEV1 1.58 (66%)  FEV1/FVC 76%  Resting HR: 84 Peak HR: 142 (91% age predicted max HR) BP rest: 114/60 BP peak: 148/70 (IPE) Peak VO2: 15.0 (63% predicted peak VO2) VE/VCO2 slope: 32 OUES: 0.85 Peak RER: 1.33 Ventilatory Threshold: 12.1 (50.8% predicted peak VO2) VE/MVV: 60.1% O2pulse: 6 (75% predicted O2pulse)  LE angiography 03/02/18 showed no signficant aortoiliac disease. Left sided showed diffuse 60-70% disease affecting the proximal SFA followed by short occlusion distally with reconstitution via collaterals from the profunda and three-vessel runoff below the knee.   Labs (10/12):  K 3.2, creatinine 0.9, Hgb 13.5 Labs (9/14):    K 3.4, creatinine 1.0, pro BNP 1560, Hgb 11.8, HDL 25, LDL 60, ALT 29 Labs (10/14):  K 3.3=> 4.2, creatinine 1.2, BNP 1175 Labs (10/02/13) K 3.2 creatinine 0.9 Labs (12/13/13): LDL 134, AST 24, ALT 24, TC 221, TG 104 Labs (8/15): K 5.1, creatinine 1.1  Labs (11/15): LDL 79, HDL 67 Labs (3/16): K 3.9, creatinine 1.13 Lasbs (06/28/2018): K 4.5 Creatinine 1.38   Wt Readings from Last 3  Encounters:  11/21/18 53.3 kg (117 lb 9.6 oz)  10/25/18 54.3 kg (119 lb 9.6 oz)  09/14/18 54.9 kg (121 lb)     Past Medical History:  Diagnosis Date  . Arthritis    hands  . Asthma   . CAD (coronary artery disease)    a. s/p prior Ant MI, b. s/p prior POBA to LAD, Dx, RCA,;  c.  LHC (10/12):  dLAD 40, pCFX 30, OM1 50, pRCA 30;  d.  [facility] Adam Myoview (11/14):  High risk study; inferior, anterior, apical defects; minimal reversibility toward the apex; consistent with prior infarct and very mild peri-infarct ischemia, EF 24%, inferior/apical/anterior akinesis  . Chronic systolic heart failure ([place])   . Depression   . H/O hiatal hernia   . HLD (hyperlipidemia)   . HTN (hypertension)   . Ischemic cardiomyopathy    MRI (12/09):  High scar burden, Inf and Apical AK, EF 24%.;  Echo (10/14): EF 15%, Gr 2 DD, mild to mod MR, mod LAE, RVSF mildly reduced, mod RAE, severe TR, PASP  49-53 (mod pulmonary HTN)  . MI (myocardial infarction) ([place])   . Tobacco abuse     Current Outpatient Medications  Medication Sig Dispense Refill  . acetaminophen (TYLENOL ARTHRITIS PAIN) 650 MG CR tablet Take 650 mg by mouth every 8 (eight) hours as needed for pain.     Marland Kitchen albuterol (PROAIR HFA) 108 (90 Base) MCG/ACT inhaler Inhale 2 puffs into the lungs every 6 (six) hours as needed for wheezing.    Marland Kitchen aspirin 81 MG tablet Take 81 mg by mouth daily.      Marland Kitchen azelastine (ASTELIN) 0.1 % nasal spray Place 1 spray into both nostrils daily.     . budesonide-formoterol (SYMBICORT) 160-4.5 MCG/ACT inhaler Inhale 2 puffs into the lungs daily.     . citalopram (CELEXA) 20 MG tablet Take 20 mg by mouth daily.    . Cyanocobalamin ([employer] B12 PO) Take 1 capsule by mouth as directed.    . digoxin (LANOXIN) 0.125 MG tablet TAKE 1/2 TABLET BY MOUTH EVERY DAY 45 tablet 3  . ezetimibe (ZETIA) 10 MG tablet TAKE 1/2 TABLET TWICE A WEEK 15 tablet 3  . fexofenadine (ALLEGRA) 180 MG tablet Take 180 mg by mouth daily as needed for allergies.      . fish oil-omega-3 fatty acids 1000 MG capsule Take 2 g by mouth daily.     . fluticasone (FLONASE) 50 MCG/ACT nasal spray Place 2 sprays into both nostrils daily.    . IRON PO Take 1 capsule by mouth as directed.    . isosorbide mononitrate (IMDUR) 30 MG 24 hr tablet TAKE 1 TABLET BY MOUTH EVERY DAY 90 tablet 3  . losartan (COZAAR) 25 MG tablet TAKE 0.5 TABLETS (12.5 MG TOTAL) BY MOUTH 2 (TWO) TIMES DAILY. 60 tablet 11  . mexiletine (MEXITIL) 150 MG capsule Take 1 capsule (150 mg total) by mouth 2 (two) times daily. 60 capsule 11  . nicotine (NICODERM CQ - DOSED IN MG/24 HOURS) 14 mg/24hr patch Place 1 patch (14 mg total) onto the skin daily. 30 patch 0  . nicotine polacrilex (NICORETTE) 4 MG gum Take 1 each (4 mg total) by mouth as needed for smoking cessation. 100 tablet 0  . nitroGLYCERIN (NITROSTAT) 0.4 MG SL tablet Place 1 tablet (0.4 mg total) under the tongue every 5 (five) minutes as needed for chest pain. 25 tablet 3  . omeprazole (PRILOSEC) 40 MG capsule Take 40 mg by mouth daily.     . polyethylene glycol (MIRALAX / GLYCOLAX) packet Take 17 g by mouth at bedtime.    . rosuvastatin (CRESTOR) 5 MG tablet Take 1 tablet by mouth on Monday, Wednesday, and Friday 45 tablet 3  . spironolactone (ALDACTONE) 25 MG tablet Take 1 tablet (25 mg total) by mouth daily. 30 tablet 0  . torsemide (DEMADEX) 20 MG tablet Take 2 tablets (40 mg total) by mouth 2 (two) times daily. 120 tablet 11   No current facility-administered medications for this encounter.    Allergies:    Allergies  Allergen Reactions  . Prednisone Shortness Of Breath, Swelling, Rash and Other (See Comments)    Sore throat, also  . Zebeta [Bisoprolol Fumarate] Other (See Comments)    Caused confusion  . Eggs Or Egg-Derived Products Other (See Comments)    Was told she is allergic to EGG WHITES  . Statins Other (See Comments)    Gradually tired with daily Lipitor, made pt feel badly (Pt can take Crestor 5 mg 4 times per week  and  Zetia 5 mg two times a week)   Social History:  The patient  reports that she has been smoking cigarettes. She has never used smokeless tobacco. She reports that she does not drink alcohol or use drugs.   Review of systems complete and found to be negative unless listed in HPI.   Wt Readings from Last 3 Encounters:  11/21/18 53.3 kg (117 lb 9.6 oz)  10/25/18 54.3 kg (119 lb 9.6 oz)  09/14/18 54.9 kg (121 lb)   Vitals:   11/21/18 1004  BP: 102/68  Pulse: 86  SpO2: 97%     PHYSICAL EXAM: General: Thin.  No resp difficulty. HEENT: Normal Neck: Supple. JVP 5-6. Carotids 2+ bilat; no bruits. No thyromegaly or nodule noted. Cor: PMI laterally displaced. RRR, No M/G/R noted Lungs: CTAB, normal effort. Abdomen: Soft, non-tender, non-distended, no HSM. No bruits or masses. +BS  Extremities: No cyanosis, clubbing, or rash. R and LLE no edema.  Neuro: Alert & orientedx3, cranial nerves grossly intact. moves all 4 extremities w/o difficulty. Affect pleasant   ASSESSMENT AND PLAN:  1.  Chronic Systolic CHF  due to ICM with mild RV dysfunction s/p [employer] ICD.  - Echo 06/2018 EF 15%   - Chronic NYHA III-IIIB symtoms - CPX 9/19 with severe functional limitation due predominantly to  HF. - Volume status stable. Continue torsemide 40 mg BID. Can increase to 60 bid when fluid overloaded. BMET today given extra diuretics and recent metolazone use. - Continue digoxin 0.0625 mg daily. Check dig level today - Continue losartan 12.5 mg daily.  - Continue spironolactone 25 mg daily. - Does not tolerate beta blockers due to symptomatic brady in past. - BP too low to titrate meds further  - Dr Haroldine Laws had a long discussion with her and her family about her situation last visit. She is nearing end-stage HF. With age, COPD and tobacco use and PAD not candidate for transplant. They discussed pros/cons for VAD as well as what criteria we use to approve candidate. VAD coordinator showed them  equipment and discussed life with VAD and outcomes. Main barriers include her small size and ongoing tobacco use. She remains unsure if she would want to go forward with LVAD. She continues to smoke 5 cigarettes per day. Per Dr Cyndia Bent, will need repeat PFTs after she quits smoking prior to LVAD. Reinforced absolute need to quit smoking. 2. CAD s/p previous anterior MI:  Cath 8/15 nonobstructive CAD.   - No s/s ischemia. Cath 7/19 non-obstructive CAD.  - Continue ASA and statin. Continue imdur.  3. COPD with ongoing tobacco abuse:  - Continues to smoke 5 cigarettes/day. She has not picked up nicotine patch or gum yet. Encouraged cessation. 4. PAD with Intermittent claudication: Medical therapy.  - Moderate bilateral calf claudication worse on the left side due to known SFA disease. This has progressed over past few months   - Had peripheral vascular catheterization 03/02/18 with no significant aortoiliac disease. Diffuse 60-70% disease affecting the left proximal and mid SFA with short occlusion distally with reconstitution via collaterals from the profunda.  Three-vessel runoff below the knee.  - Continue Risk factor management and smoking cessation. No change.  5. Bilateral carotid stenosis - Followed by Dr. Johnsie Cancel. No change.  6. H/o symptomatic bradycardia:  - Stable off BB. Do not plan to re-challenge with concerns for low output. No change.  7. Hyperlipidemia with intolerance of multiple cholesterol meds: - Tolerating Crestor 5 mg three times/week and Zetia 5mg  two times  weekly.  - Per PCP. No change.  8. Depression/Anxiety:  - Per PCP.  No change.  9. PVCs - No PVCs on exam today. - Continue mexilitene 150 mg BID. No change.  Offered to talk to LVAD coordinator today, but she would like to talk on the phone with someone. I will message the VAD coordinators and let them know.  Encouraged tobacco cessation  No room for med titration. Follow up in 6 weeks with Dr Haroldine Laws  Georgiana Shore, NP  10:11 AM  Greater than 50% of the 25 minute visit was spent in counseling/coordination of care regarding disease state education, salt/fluid restriction, sliding scale diuretics, and medication compliance.

## 2018-11-17 ENCOUNTER — Ambulatory Visit (INDEPENDENT_AMBULATORY_CARE_PROVIDER_SITE_OTHER): Payer: Medicare Other

## 2018-11-17 ENCOUNTER — Telehealth: Payer: Self-pay | Admitting: Cardiology

## 2018-11-17 DIAGNOSIS — Z95810 Presence of automatic (implantable) cardiac defibrillator: Secondary | ICD-10-CM | POA: Diagnosis not present

## 2018-11-17 DIAGNOSIS — I5022 Chronic systolic (congestive) heart failure: Secondary | ICD-10-CM

## 2018-11-17 NOTE — Telephone Encounter (Signed)
LMOVM reminding pt to send remote transmission.   

## 2018-11-18 NOTE — Progress Notes (Signed)
EPIC Encounter for ICM Monitoring  Patient Name: [name] is a 67 y.o. female Date: 11/18/2018 Primary Care Physican: [place] Cardiologist: Nishan/Bensimhon Electrophysiologist: Allred Last Weight:112lbs Today's Weight: unknown       Transmission reviewed   Thoracic impedance normal but does have episodes of decreased impedance.   Prescribed: Torsemide20 mg take 2 tablets(40 mg total) by mouthtwice a day. Increase to 60 bid when fluid overloaded.(per Dr Bensimhon's office note 10/11/2018)  Labs: 11/05/2019Creatinine 1.43, BUN21, Potassium4.2, Sodium 136, [id number] 09/14/2018 Creatinine 1.31, BUN 16, Potassium 3.9, Sodium 135, eGFR 41-47 07/13/2018 Creatinine 1.18, BUN 16, Potassium 3.8, Sodium 136, eGFR 46-53 07/06/2018 Creatinine1.28, BUN20, Potassium4.3, Sodium127,eGFR42-48 06/28/2018 Creatinine1.38, BUN29, Potassium4.5, Sodium129,eGFR38-44  06/27/2018 Creatinine1.33, BUN24, Potassium4.1, Sodium129,eGFR40-46  06/26/2018 Creatinine1.43, BUN21, Potassium3.8, Sodium130,eGFR36-42  06/25/2018 Creatinine1.86, BUN21, Potassium4.0, Sodium131,eGFR27-31  06/24/2018 Creatinine1.50, BUN18, Potassium3.5, Sodium133,eGFR34-40  Recommendations: None  Follow-up plan: ICM clinic phone appointment on 12/26/2018.   Office appointment scheduled 11/21/2018 with HF Clinic NP/PA.    Copy of ICM check sent to Dr. Rayann Heman.   3 month ICM trend: 11/17/2018    1 Year ICM trend:       Rosalene Billings, RN 11/18/2018 9:39 AM

## 2018-11-21 ENCOUNTER — Ambulatory Visit (HOSPITAL_COMMUNITY)
Admission: RE | Admit: 2018-11-21 | Discharge: 2018-11-21 | Disposition: A | Discharge status: Home / Self Care | Payer: Medicare Other | Source: Ambulatory Visit | Attending: Internal Medicine | Admitting: Internal Medicine

## 2018-11-21 ENCOUNTER — Encounter (HOSPITAL_COMMUNITY): Payer: Self-pay

## 2018-11-21 ENCOUNTER — Other Ambulatory Visit: Payer: Self-pay

## 2018-11-21 VITALS — BP 102/68 | HR 86 | Wt 117.6 lb

## 2018-11-21 DIAGNOSIS — F329 Major depressive disorder, single episode, unspecified: Secondary | ICD-10-CM | POA: Diagnosis not present

## 2018-11-21 DIAGNOSIS — I252 Old myocardial infarction: Secondary | ICD-10-CM | POA: Insufficient documentation

## 2018-11-21 DIAGNOSIS — I2510 Atherosclerotic heart disease of native coronary artery without angina pectoris: Secondary | ICD-10-CM

## 2018-11-21 DIAGNOSIS — E785 Hyperlipidemia, unspecified: Secondary | ICD-10-CM | POA: Diagnosis not present

## 2018-11-21 DIAGNOSIS — I255 Ischemic cardiomyopathy: Secondary | ICD-10-CM | POA: Insufficient documentation

## 2018-11-21 DIAGNOSIS — Z79899 Other long term (current) drug therapy: Secondary | ICD-10-CM | POA: Insufficient documentation

## 2018-11-21 DIAGNOSIS — I509 Heart failure, unspecified: Secondary | ICD-10-CM

## 2018-11-21 DIAGNOSIS — I110 Hypertensive heart disease with heart failure: Secondary | ICD-10-CM | POA: Diagnosis present

## 2018-11-21 DIAGNOSIS — Z95810 Presence of automatic (implantable) cardiac defibrillator: Secondary | ICD-10-CM | POA: Insufficient documentation

## 2018-11-21 DIAGNOSIS — I6523 Occlusion and stenosis of bilateral carotid arteries: Secondary | ICD-10-CM | POA: Diagnosis not present

## 2018-11-21 DIAGNOSIS — J449 Chronic obstructive pulmonary disease, unspecified: Secondary | ICD-10-CM | POA: Diagnosis not present

## 2018-11-21 DIAGNOSIS — I493 Ventricular premature depolarization: Secondary | ICD-10-CM

## 2018-11-21 DIAGNOSIS — Z7982 Long term (current) use of aspirin: Secondary | ICD-10-CM | POA: Diagnosis not present

## 2018-11-21 DIAGNOSIS — F17210 Nicotine dependence, cigarettes, uncomplicated: Secondary | ICD-10-CM | POA: Insufficient documentation

## 2018-11-21 DIAGNOSIS — I5022 Chronic systolic (congestive) heart failure: Secondary | ICD-10-CM | POA: Diagnosis not present

## 2018-11-21 DIAGNOSIS — I739 Peripheral vascular disease, unspecified: Secondary | ICD-10-CM

## 2018-11-21 DIAGNOSIS — Z720 Tobacco use: Secondary | ICD-10-CM

## 2018-11-21 LAB — BASIC METABOLIC PANEL
Anion gap: 12 (ref 5–15)
BUN: 24 mg/dL — AB (ref 8–23)
CALCIUM: 9.3 mg/dL (ref 8.9–10.3)
CHLORIDE: 98 mmol/L (ref 98–111)
CO2: 27 mmol/L (ref 22–32)
CREATININE: 1.4 mg/dL — AB (ref 0.44–1.00)
GFR calc non Af Amer: 38 mL/min — ABNORMAL LOW (ref >60)
GFR, EST AFRICAN AMERICAN: 44 mL/min — AB (ref >60)
Glucose, Bld: 105 mg/dL — ABNORMAL HIGH (ref 70–99)
Potassium: 3.5 mmol/L (ref 3.5–5.1)
Sodium: 137 mmol/L (ref 135–145)

## 2018-11-21 LAB — DIGOXIN LEVEL: DIGOXIN LVL: 0.9 ng/mL (ref 0.8–2.0)

## 2018-11-21 LAB — MAGNESIUM: Magnesium: 2.2 mg/dL (ref 1.7–2.4)

## 2018-11-21 MED ORDER — METOLAZONE 2.5 MG PO TABS: 2.5000 mg | ORAL_TABLET | Freq: Every day | ORAL | 0 refills | Status: DC

## 2018-11-21 NOTE — Patient Instructions (Signed)
It was great to see you today! No medication changes are needed at this time.   Labs today We will only contact you if something comes back abnormal or we need to make some changes. Otherwise no news is good news!   Your physician recommends that you schedule a follow-up appointment in: 6-8 weeks with Dr Haroldine Laws  Do the following things EVERYDAY: 1) Weigh yourself in the morning before breakfast. Write it down and keep it in a log. 2) Take your medicines as prescribed 3) Eat low salt foods-Limit salt (sodium) to 2000 mg per day.  4) Stay as active as you can everyday 5) Limit all fluids for the day to less than 2 liters   At the [facility], you and your health needs are our priority. As part of our continuing mission to provide you with exceptional heart care, we have created designated Provider Care Teams. These Care Teams include your primary Cardiologist (physician) and Advanced Practice Providers (APPs- Physician Assistants and Nurse Practitioners) who all work together to provide you with the care you need, when you need it.   You may see any of the following providers on your designated Care Team at your next follow up: Marland Kitchen Dr Glori Bickers . Dr Loralie Champagne . Darrick Grinder, NP . Lillia Mountain, NP . Rebecca Eaton

## 2018-11-25 ENCOUNTER — Telehealth (HOSPITAL_COMMUNITY): Payer: Self-pay | Admitting: Cardiology

## 2018-11-25 DIAGNOSIS — I5022 Chronic systolic (congestive) heart failure: Secondary | ICD-10-CM

## 2018-11-25 NOTE — Telephone Encounter (Signed)
Notes recorded by Kerry Dory, CMA on 11/25/2018 at 11:35 AM EST Pt reports she DID take digoxin the morning of last appt, advised to HOLD digoxin at upcoming appt ------  Notes recorded by Kerry Dory, CMA on 11/25/2018 at 11:35 AM EST Patient aware. Patient voiced understanding, repeat labs 12/24   ------  Notes recorded by Kerry Dory, CMA on 11/24/2018 at 9:58 AM EST Left message for patient to call back. [phone] (H) ------  Notes recorded by Georgiana Shore, NP on 11/21/2018 at 5:09 PM EST Renal function stable. Dig level a little high. Can you please call and see if she had taken digoxin this morning? If she did, please schedule recheck dig level in the next week or so and have her HOLD prior to checking. If she did not take today, please let me know and we will adjust dosing. Thanks

## 2018-11-29 ENCOUNTER — Ambulatory Visit (HOSPITAL_COMMUNITY)
Admission: RE | Admit: 2018-11-29 | Discharge: 2018-11-29 | Disposition: A | Discharge status: Home / Self Care | Payer: Medicare Other | Source: Ambulatory Visit | Attending: Internal Medicine | Admitting: Internal Medicine

## 2018-11-29 ENCOUNTER — Ambulatory Visit
Admission: RE | Admit: 2018-11-29 | Discharge: 2018-11-29 | Disposition: A | Discharge status: Home / Self Care | Payer: Medicare Other | Source: Ambulatory Visit | Attending: Internal Medicine | Admitting: Internal Medicine

## 2018-11-29 DIAGNOSIS — I509 Heart failure, unspecified: Secondary | ICD-10-CM

## 2018-11-29 DIAGNOSIS — Z1231 Encounter for screening mammogram for malignant neoplasm of breast: Secondary | ICD-10-CM

## 2018-11-29 LAB — BASIC METABOLIC PANEL
Anion gap: 12 (ref 5–15)
BUN: 37 mg/dL — AB (ref 8–23)
CO2: 26 mmol/L (ref 22–32)
Calcium: 9.3 mg/dL (ref 8.9–10.3)
Chloride: 96 mmol/L — ABNORMAL LOW (ref 98–111)
Creatinine, Ser: 1.8 mg/dL — ABNORMAL HIGH (ref 0.44–1.00)
GFR calc Af Amer: 33 mL/min — ABNORMAL LOW (ref >60)
GFR calc non Af Amer: 28 mL/min — ABNORMAL LOW (ref >60)
Glucose, Bld: 129 mg/dL — ABNORMAL HIGH (ref 70–99)
POTASSIUM: 3.3 mmol/L — AB (ref 3.5–5.1)
SODIUM: 134 mmol/L — AB (ref 135–145)

## 2018-11-29 LAB — DIGOXIN LEVEL: DIGOXIN LVL: 0.6 ng/mL — AB (ref 0.8–2.0)

## 2018-12-08 ENCOUNTER — Other Ambulatory Visit (HOSPITAL_COMMUNITY): Payer: Self-pay

## 2018-12-08 MED ORDER — POTASSIUM CHLORIDE CRYS ER 20 MEQ PO TBCR: 20.0000 meq | EXTENDED_RELEASE_TABLET | Freq: Every day | ORAL | 2 refills | Status: DC

## 2018-12-14 ENCOUNTER — Telehealth (HOSPITAL_COMMUNITY): Payer: Self-pay

## 2018-12-14 NOTE — Telephone Encounter (Signed)
Pt called and reported that she has edema in abdomen, weight is up to 119 lb, SOB on 12/13/18 but not so much today. Pt is currently taking torsemide 20 mg (Take 2 tablets (40 mg total) by mouth 2 (two) times daily).  Pt also reported that she went to see her PCP at [facility] due to a ear infection. Dr. Doristine Section her on antibiotics (levofloxacin) and pt wants to know if that will affect her heart condition. Please advise.

## 2018-12-14 NOTE — Telephone Encounter (Signed)
She is only up 2 lbs based on our last weight. She can continue current torsemide today. If weight continues to trend up, she can take 60 mg BID x1 day. Please make sure she keeps her lab appointment Friday. Thanks.

## 2018-12-14 NOTE — Telephone Encounter (Signed)
Pt notified Verbalizes understanding 

## 2018-12-16 ENCOUNTER — Ambulatory Visit (HOSPITAL_COMMUNITY)
Admission: RE | Admit: 2018-12-16 | Discharge: 2018-12-16 | Disposition: A | Discharge status: Home / Self Care | Payer: Medicare Other | Source: Ambulatory Visit | Attending: Cardiology | Admitting: Cardiology

## 2018-12-16 DIAGNOSIS — I509 Heart failure, unspecified: Secondary | ICD-10-CM

## 2018-12-16 DIAGNOSIS — I5022 Chronic systolic (congestive) heart failure: Secondary | ICD-10-CM

## 2018-12-16 LAB — BASIC METABOLIC PANEL
Anion gap: 10 (ref 5–15)
BUN: 45 mg/dL — ABNORMAL HIGH (ref 8–23)
CO2: 24 mmol/L (ref 22–32)
Calcium: 9.4 mg/dL (ref 8.9–10.3)
Chloride: 99 mmol/L (ref 98–111)
Creatinine, Ser: 2.1 mg/dL — ABNORMAL HIGH (ref 0.44–1.00)
GFR calc Af Amer: 27 mL/min — ABNORMAL LOW (ref >60)
GFR calc non Af Amer: 23 mL/min — ABNORMAL LOW (ref >60)
Glucose, Bld: 94 mg/dL (ref 70–99)
Potassium: 4.1 mmol/L (ref 3.5–5.1)
Sodium: 133 mmol/L — ABNORMAL LOW (ref 135–145)

## 2018-12-21 ENCOUNTER — Encounter (HOSPITAL_COMMUNITY): Payer: Self-pay

## 2018-12-21 ENCOUNTER — Other Ambulatory Visit (HOSPITAL_COMMUNITY): Payer: Self-pay

## 2018-12-21 ENCOUNTER — Ambulatory Visit (HOSPITAL_COMMUNITY): Admit: 2018-12-21 | Payer: Medicare Other | Admitting: Internal Medicine

## 2018-12-21 ENCOUNTER — Ambulatory Visit (HOSPITAL_COMMUNITY)
Admission: RE | Admit: 2018-12-21 | Discharge: 2018-12-21 | Disposition: A | Discharge status: Home / Self Care | Payer: Medicare Other | Source: Ambulatory Visit | Attending: Cardiology | Admitting: Cardiology

## 2018-12-21 DIAGNOSIS — I255 Ischemic cardiomyopathy: Secondary | ICD-10-CM | POA: Insufficient documentation

## 2018-12-21 LAB — BASIC METABOLIC PANEL
Anion gap: 10 (ref 5–15)
BUN: 34 mg/dL — ABNORMAL HIGH (ref 8–23)
CALCIUM: 8.8 mg/dL — AB (ref 8.9–10.3)
CO2: 25 mmol/L (ref 22–32)
Chloride: 101 mmol/L (ref 98–111)
Creatinine, Ser: 1.65 mg/dL — ABNORMAL HIGH (ref 0.44–1.00)
GFR calc non Af Amer: 31 mL/min — ABNORMAL LOW (ref >60)
GFR, EST AFRICAN AMERICAN: 36 mL/min — AB (ref >60)
Glucose, Bld: 126 mg/dL — ABNORMAL HIGH (ref 70–99)
Potassium: 3.4 mmol/L — ABNORMAL LOW (ref 3.5–5.1)
Sodium: 136 mmol/L (ref 135–145)

## 2018-12-21 SURGERY — RIGHT HEART CATH
Anesthesia: LOCAL

## 2018-12-21 NOTE — Addendum Note (Signed)
Encounter addended by: Harvie Junior, CMA on: 12/21/2018 1:23 PM  Actions taken: Order list changed, Diagnosis association updated

## 2018-12-22 ENCOUNTER — Inpatient Hospital Stay (HOSPITAL_COMMUNITY)
Admission: RE | Admit: 2018-12-22 | Discharge: 2018-12-23 | DRG: 286 | Disposition: A | Discharge status: Home Health | Payer: Medicare Other | Attending: Internal Medicine | Admitting: Internal Medicine

## 2018-12-22 ENCOUNTER — Encounter (HOSPITAL_COMMUNITY): Payer: Self-pay | Admitting: Internal Medicine

## 2018-12-22 ENCOUNTER — Other Ambulatory Visit: Payer: Self-pay

## 2018-12-22 ENCOUNTER — Encounter (HOSPITAL_COMMUNITY)
Admission: RE | Disposition: A | Discharge status: Home Health | Payer: Self-pay | Source: Home / Self Care | Attending: Internal Medicine

## 2018-12-22 DIAGNOSIS — N179 Acute kidney failure, unspecified: Secondary | ICD-10-CM | POA: Diagnosis present

## 2018-12-22 DIAGNOSIS — I255 Ischemic cardiomyopathy: Secondary | ICD-10-CM | POA: Diagnosis present

## 2018-12-22 DIAGNOSIS — Z888 Allergy status to other drugs, medicaments and biological substances status: Secondary | ICD-10-CM

## 2018-12-22 DIAGNOSIS — Z7982 Long term (current) use of aspirin: Secondary | ICD-10-CM

## 2018-12-22 DIAGNOSIS — E785 Hyperlipidemia, unspecified: Secondary | ICD-10-CM | POA: Diagnosis present

## 2018-12-22 DIAGNOSIS — N183 Chronic kidney disease, stage 3 (moderate): Secondary | ICD-10-CM | POA: Diagnosis present

## 2018-12-22 DIAGNOSIS — M19041 Primary osteoarthritis, right hand: Secondary | ICD-10-CM | POA: Diagnosis present

## 2018-12-22 DIAGNOSIS — Z833 Family history of diabetes mellitus: Secondary | ICD-10-CM

## 2018-12-22 DIAGNOSIS — Z95810 Presence of automatic (implantable) cardiac defibrillator: Secondary | ICD-10-CM | POA: Diagnosis not present

## 2018-12-22 DIAGNOSIS — I493 Ventricular premature depolarization: Secondary | ICD-10-CM | POA: Diagnosis present

## 2018-12-22 DIAGNOSIS — J449 Chronic obstructive pulmonary disease, unspecified: Secondary | ICD-10-CM | POA: Diagnosis present

## 2018-12-22 DIAGNOSIS — I130 Hypertensive heart and chronic kidney disease with heart failure and stage 1 through stage 4 chronic kidney disease, or unspecified chronic kidney disease: Secondary | ICD-10-CM | POA: Diagnosis present

## 2018-12-22 DIAGNOSIS — I252 Old myocardial infarction: Secondary | ICD-10-CM | POA: Diagnosis not present

## 2018-12-22 DIAGNOSIS — Z823 Family history of stroke: Secondary | ICD-10-CM | POA: Diagnosis not present

## 2018-12-22 DIAGNOSIS — I071 Rheumatic tricuspid insufficiency: Secondary | ICD-10-CM | POA: Diagnosis present

## 2018-12-22 DIAGNOSIS — M19042 Primary osteoarthritis, left hand: Secondary | ICD-10-CM | POA: Diagnosis present

## 2018-12-22 DIAGNOSIS — Z91012 Allergy to eggs: Secondary | ICD-10-CM

## 2018-12-22 DIAGNOSIS — F17210 Nicotine dependence, cigarettes, uncomplicated: Secondary | ICD-10-CM | POA: Diagnosis present

## 2018-12-22 DIAGNOSIS — I2510 Atherosclerotic heart disease of native coronary artery without angina pectoris: Secondary | ICD-10-CM | POA: Diagnosis present

## 2018-12-22 DIAGNOSIS — L8990 Pressure ulcer of unspecified site, unspecified stage: Secondary | ICD-10-CM

## 2018-12-22 DIAGNOSIS — Z7951 Long term (current) use of inhaled steroids: Secondary | ICD-10-CM

## 2018-12-22 DIAGNOSIS — Z79899 Other long term (current) drug therapy: Secondary | ICD-10-CM

## 2018-12-22 DIAGNOSIS — K449 Diaphragmatic hernia without obstruction or gangrene: Secondary | ICD-10-CM | POA: Diagnosis present

## 2018-12-22 DIAGNOSIS — I5023 Acute on chronic systolic (congestive) heart failure: Secondary | ICD-10-CM | POA: Diagnosis present

## 2018-12-22 DIAGNOSIS — I2721 Secondary pulmonary arterial hypertension: Secondary | ICD-10-CM | POA: Diagnosis not present

## 2018-12-22 DIAGNOSIS — Z803 Family history of malignant neoplasm of breast: Secondary | ICD-10-CM

## 2018-12-22 HISTORY — PX: RIGHT HEART CATH: CATH118263

## 2018-12-22 HISTORY — DX: Chronic kidney disease, stage 3 unspecified: N18.30

## 2018-12-22 HISTORY — DX: Chronic kidney disease, stage 3 (moderate): N18.3

## 2018-12-22 LAB — CBC WITH DIFFERENTIAL/PLATELET
Abs Immature Granulocytes: 0.05 10*3/uL (ref 0.00–0.07)
Basophils Absolute: 0 10*3/uL (ref 0.0–0.1)
Basophils Relative: 0 %
EOS ABS: 0.1 10*3/uL (ref 0.0–0.5)
Eosinophils Relative: 1 %
HCT: 35.4 % — ABNORMAL LOW (ref 36.0–46.0)
Hemoglobin: 11.3 g/dL — ABNORMAL LOW (ref 12.0–15.0)
Immature Granulocytes: 1 %
Lymphocytes Relative: 15 %
Lymphs Abs: 1.2 10*3/uL (ref 0.7–4.0)
MCH: 29 pg (ref 26.0–34.0)
MCHC: 31.9 g/dL (ref 30.0–36.0)
MCV: 91 fL (ref 80.0–100.0)
MONOS PCT: 11 %
Monocytes Absolute: 0.8 10*3/uL (ref 0.1–1.0)
Neutro Abs: 5.5 10*3/uL (ref 1.7–7.7)
Neutrophils Relative %: 72 %
Platelets: 156 10*3/uL (ref 150–400)
RBC: 3.89 MIL/uL (ref 3.87–5.11)
RDW: 15.2 % (ref 11.5–15.5)
WBC: 7.7 10*3/uL (ref 4.0–10.5)
nRBC: 0 % (ref 0.0–0.2)

## 2018-12-22 LAB — LIPID PANEL
Cholesterol: 87 mg/dL (ref 0–200)
HDL: 39 mg/dL — ABNORMAL LOW (ref >40)
LDL Cholesterol: 42 mg/dL (ref 0–99)
Total CHOL/HDL Ratio: 2.2 RATIO
Triglycerides: 31 mg/dL (ref <150)
VLDL: 6 mg/dL (ref 0–40)

## 2018-12-22 LAB — CBC
HCT: 36.6 % (ref 36.0–46.0)
HEMOGLOBIN: 11.6 g/dL — AB (ref 12.0–15.0)
MCH: 29.1 pg (ref 26.0–34.0)
MCHC: 31.7 g/dL (ref 30.0–36.0)
MCV: 91.7 fL (ref 80.0–100.0)
Platelets: 161 10*3/uL (ref 150–400)
RBC: 3.99 MIL/uL (ref 3.87–5.11)
RDW: 15.1 % (ref 11.5–15.5)
WBC: 8 10*3/uL (ref 4.0–10.5)
nRBC: 0 % (ref 0.0–0.2)

## 2018-12-22 LAB — POCT I-STAT 3, VENOUS BLOOD GAS (G3P V)
Acid-Base Excess: 2 mmol/L (ref 0.0–2.0)
BICARBONATE: 27.8 mmol/L (ref 20.0–28.0)
Bicarbonate: 25.5 mmol/L (ref 20.0–28.0)
O2 Saturation: 47 %
O2 Saturation: 48 %
PH VEN: 7.393 (ref 7.250–7.430)
TCO2: 27 mmol/L (ref 22–32)
TCO2: 29 mmol/L (ref 22–32)
pCO2, Ven: 41.8 mmHg — ABNORMAL LOW (ref 44.0–60.0)
pCO2, Ven: 45.6 mmHg (ref 44.0–60.0)
pH, Ven: 7.394 (ref 7.250–7.430)
pO2, Ven: 26 mmHg — CL (ref 32.0–45.0)
pO2, Ven: 26 mmHg — CL (ref 32.0–45.0)

## 2018-12-22 LAB — BASIC METABOLIC PANEL
Anion gap: 10 (ref 5–15)
BUN: 36 mg/dL — ABNORMAL HIGH (ref 8–23)
CO2: 25 mmol/L (ref 22–32)
Calcium: 9 mg/dL (ref 8.9–10.3)
Chloride: 102 mmol/L (ref 98–111)
Creatinine, Ser: 1.71 mg/dL — ABNORMAL HIGH (ref 0.44–1.00)
GFR calc Af Amer: 35 mL/min — ABNORMAL LOW (ref >60)
GFR, EST NON AFRICAN AMERICAN: 30 mL/min — AB (ref >60)
Glucose, Bld: 121 mg/dL — ABNORMAL HIGH (ref 70–99)
Potassium: 3.5 mmol/L (ref 3.5–5.1)
Sodium: 137 mmol/L (ref 135–145)

## 2018-12-22 LAB — MRSA PCR SCREENING: MRSA BY PCR: NEGATIVE

## 2018-12-22 SURGERY — RIGHT HEART CATH
Anesthesia: LOCAL

## 2018-12-22 MED ORDER — DIGOXIN 0.0625 MG HALF TABLET: 62.5000 ug | ORAL_TABLET | Freq: Every day | ORAL | Status: DC

## 2018-12-22 MED ORDER — MOMETASONE FURO-FORMOTEROL FUM 200-5 MCG/ACT IN AERO
2.0000 | INHALATION_SPRAY | Freq: Two times a day (BID) | RESPIRATORY_TRACT | Status: DC
  Administered 2018-12-22 – 2018-12-23 (×2): 2 via RESPIRATORY_TRACT
  Filled 2018-12-22: qty 8.8

## 2018-12-22 MED ORDER — ACETAMINOPHEN 325 MG PO TABS: 650.0000 mg | ORAL_TABLET | ORAL | Status: DC | PRN

## 2018-12-22 MED ORDER — SODIUM CHLORIDE 0.9% FLUSH: 3.0000 mL | Freq: Two times a day (BID) | INTRAVENOUS | Status: DC

## 2018-12-22 MED ORDER — OMEGA-3 FATTY ACIDS 1000 MG PO CAPS: 2.0000 g | ORAL_CAPSULE | Freq: Every day | ORAL | Status: DC

## 2018-12-22 MED ORDER — LORATADINE 10 MG PO TABS
10.0000 mg | ORAL_TABLET | Freq: Every day | ORAL | Status: DC
  Administered 2018-12-23: 10 mg via ORAL
  Filled 2018-12-22: qty 1

## 2018-12-22 MED ORDER — MILRINONE LACTATE IN DEXTROSE 20-5 MG/100ML-% IV SOLN
0.2500 ug/kg/min | INTRAVENOUS | Status: DC
  Administered 2018-12-22: 0.25 ug/kg/min via INTRAVENOUS
  Filled 2018-12-22 (×2): qty 100

## 2018-12-22 MED ORDER — HEPARIN (PORCINE) IN NACL 1000-0.9 UT/500ML-% IV SOLN
INTRAVENOUS | Status: AC
  Filled 2018-12-22: qty 500

## 2018-12-22 MED ORDER — POTASSIUM CHLORIDE CRYS ER 20 MEQ PO TBCR
20.0000 meq | EXTENDED_RELEASE_TABLET | Freq: Every day | ORAL | Status: DC
  Administered 2018-12-22 – 2018-12-23 (×2): 20 meq via ORAL
  Filled 2018-12-22 (×2): qty 1

## 2018-12-22 MED ORDER — ROSUVASTATIN CALCIUM 5 MG PO TABS
5.0000 mg | ORAL_TABLET | ORAL | Status: DC
  Administered 2018-12-23: 5 mg via ORAL
  Filled 2018-12-22: qty 1

## 2018-12-22 MED ORDER — SODIUM CHLORIDE 0.9% FLUSH: 3.0000 mL | INTRAVENOUS | Status: DC | PRN

## 2018-12-22 MED ORDER — ASPIRIN 81 MG PO CHEW: 81.0000 mg | CHEWABLE_TABLET | ORAL | Status: DC

## 2018-12-22 MED ORDER — CITALOPRAM HYDROBROMIDE 20 MG PO TABS
20.0000 mg | ORAL_TABLET | Freq: Every day | ORAL | Status: DC
  Administered 2018-12-23: 20 mg via ORAL
  Filled 2018-12-22: qty 1

## 2018-12-22 MED ORDER — LOSARTAN POTASSIUM 25 MG PO TABS
12.5000 mg | ORAL_TABLET | Freq: Two times a day (BID) | ORAL | Status: DC
  Administered 2018-12-22 – 2018-12-23 (×2): 12.5 mg via ORAL
  Filled 2018-12-22 (×2): qty 1

## 2018-12-22 MED ORDER — TORSEMIDE 20 MG PO TABS
40.0000 mg | ORAL_TABLET | Freq: Two times a day (BID) | ORAL | Status: DC
  Administered 2018-12-22 – 2018-12-23 (×3): 40 mg via ORAL
  Filled 2018-12-22 (×3): qty 2

## 2018-12-22 MED ORDER — AZELASTINE HCL 0.1 % NA SOLN
1.0000 | Freq: Every day | NASAL | Status: DC
  Administered 2018-12-23: 1 via NASAL
  Filled 2018-12-22: qty 30

## 2018-12-22 MED ORDER — ISOSORBIDE MONONITRATE ER 30 MG PO TB24
30.0000 mg | ORAL_TABLET | Freq: Every day | ORAL | Status: DC
  Administered 2018-12-23: 30 mg via ORAL
  Filled 2018-12-22: qty 1

## 2018-12-22 MED ORDER — ALBUTEROL SULFATE (2.5 MG/3ML) 0.083% IN NEBU
2.5000 mg | INHALATION_SOLUTION | Freq: Four times a day (QID) | RESPIRATORY_TRACT | Status: DC | PRN
  Administered 2018-12-23: 2.5 mg via RESPIRATORY_TRACT

## 2018-12-22 MED ORDER — FLUTICASONE PROPIONATE 50 MCG/ACT NA SUSP
2.0000 | Freq: Every day | NASAL | Status: DC
  Administered 2018-12-23: 2 via NASAL
  Filled 2018-12-22: qty 16

## 2018-12-22 MED ORDER — PANTOPRAZOLE SODIUM 40 MG PO TBEC
40.0000 mg | DELAYED_RELEASE_TABLET | Freq: Every day | ORAL | Status: DC
  Administered 2018-12-23: 40 mg via ORAL
  Filled 2018-12-22: qty 1

## 2018-12-22 MED ORDER — NICOTINE 14 MG/24HR TD PT24
14.0000 mg | MEDICATED_PATCH | TRANSDERMAL | Status: DC
  Administered 2018-12-23: 14 mg via TRANSDERMAL
  Filled 2018-12-22: qty 1

## 2018-12-22 MED ORDER — ONDANSETRON HCL 4 MG/2ML IJ SOLN: 4.0000 mg | Freq: Four times a day (QID) | INTRAMUSCULAR | Status: DC | PRN

## 2018-12-22 MED ORDER — SODIUM CHLORIDE 0.9 % IV SOLN: 250.0000 mL | INTRAVENOUS | Status: DC | PRN

## 2018-12-22 MED ORDER — LIDOCAINE IN D5W 4-5 MG/ML-% IV SOLN
INTRAVENOUS | Status: AC
  Filled 2018-12-22: qty 500

## 2018-12-22 MED ORDER — ENOXAPARIN SODIUM 40 MG/0.4ML ~~LOC~~ SOLN: 40.0000 mg | SUBCUTANEOUS | Status: DC

## 2018-12-22 MED ORDER — SPIRONOLACTONE 25 MG PO TABS
25.0000 mg | ORAL_TABLET | Freq: Every day | ORAL | Status: DC
  Administered 2018-12-23: 25 mg via ORAL
  Filled 2018-12-22: qty 1

## 2018-12-22 MED ORDER — MEXILETINE HCL 150 MG PO CAPS
150.0000 mg | ORAL_CAPSULE | Freq: Two times a day (BID) | ORAL | Status: DC
  Administered 2018-12-22 – 2018-12-23 (×2): 150 mg via ORAL
  Filled 2018-12-22 (×2): qty 1

## 2018-12-22 MED ORDER — POLYETHYLENE GLYCOL 3350 17 G PO PACK
17.0000 g | PACK | Freq: Every day | ORAL | Status: DC
  Filled 2018-12-22: qty 1

## 2018-12-22 MED ORDER — ASPIRIN EC 81 MG PO TBEC
81.0000 mg | DELAYED_RELEASE_TABLET | Freq: Every day | ORAL | Status: DC
  Administered 2018-12-23: 81 mg via ORAL
  Filled 2018-12-22: qty 1

## 2018-12-22 MED ORDER — DIGOXIN 125 MCG PO TABS
0.0625 mg | ORAL_TABLET | Freq: Every day | ORAL | Status: DC
  Administered 2018-12-23: 0.0625 mg via ORAL
  Filled 2018-12-22: qty 1

## 2018-12-22 MED ORDER — EZETIMIBE 10 MG PO TABS: 5.0000 mg | ORAL_TABLET | ORAL | Status: DC

## 2018-12-22 MED ORDER — NICOTINE 14 MG/24HR TD PT24: 14.0000 mg | MEDICATED_PATCH | TRANSDERMAL | Status: DC

## 2018-12-22 MED ORDER — SODIUM CHLORIDE 0.9 % IV SOLN
INTRAVENOUS | Status: DC
  Administered 2018-12-22: 07:00:00 via INTRAVENOUS

## 2018-12-22 MED ORDER — ENOXAPARIN SODIUM 30 MG/0.3ML ~~LOC~~ SOLN
30.0000 mg | SUBCUTANEOUS | Status: DC
  Filled 2018-12-22: qty 0.3

## 2018-12-22 MED ORDER — HEPARIN (PORCINE) IN NACL 1000-0.9 UT/500ML-% IV SOLN
INTRAVENOUS | Status: DC | PRN
  Administered 2018-12-22: 500 mL

## 2018-12-22 MED ORDER — CLONAZEPAM 0.25 MG PO TBDP: 0.2500 mg | ORAL_TABLET | Freq: Two times a day (BID) | ORAL | Status: DC | PRN

## 2018-12-22 MED ORDER — LIDOCAINE HCL (PF) 1 % IJ SOLN
INTRAMUSCULAR | Status: DC | PRN
  Administered 2018-12-22: 2 mL via INTRADERMAL

## 2018-12-22 MED ORDER — OMEGA-3-ACID ETHYL ESTERS 1 G PO CAPS
2.0000 g | ORAL_CAPSULE | Freq: Every day | ORAL | Status: DC
  Administered 2018-12-23: 2 g via ORAL
  Filled 2018-12-22: qty 2

## 2018-12-22 SURGICAL SUPPLY — 5 items
CATH BALLN WEDGE 5F 110CM (CATHETERS) ×1 IMPLANT
KIT HEART LEFT (KITS) ×2 IMPLANT
PACK CARDIAC CATHETERIZATION (CUSTOM PROCEDURE TRAY) ×2 IMPLANT
SHEATH GLIDE SLENDER 4/5FR (SHEATH) ×1 IMPLANT
TRANSDUCER W/STOPCOCK (MISCELLANEOUS) ×2 IMPLANT

## 2018-12-22 NOTE — Progress Notes (Signed)
[facility] Coordinator will follow pt with HF team to support home inotropes at DC.  If patient discharges after hours, please call [phone].   Larry Sierras 12/22/2018, 9:31 AM

## 2018-12-22 NOTE — H&P (Signed)
Advanced Heart Failure H&P  Patient Terri: [name], female   DOB: [birth year], 68 y.o.   MRN: [id number]  Date:  12/22/2018   Terri:  [name], DOB [birth year], MRN [id number]  PCP:  [name] in [place] Electrophysiologist:  Dr. Thompson Grayer  General Cardiologist: Dr Johnsie Cancel HF: Dr Haroldine Laws   History of Present Illness: [name] is a 68 y.o. female who was initially referred to the HF Clinic for evaluation for advanced therapies.   She has a hx of CAD, s/p prior Ant MI, s/p prior POBA to LAD, Dx, RCA, CHF due to ICM with high scar burden by MRI (12/09), s/p ICD, HTN, HL, depression, tobacco abuse.   Admitted 2/8 - 01/17/18 with Acute/bronchitis + Influenza A. Diuresed with IV lasix with A/C CHF. Discharge weight 128 lbs. Finished course of doxycyline as outpatient.   Admitted 6/28 through 06/08/2018 with volume overload. Diuresed with IV lasix and transitioned to 80 mg lasix daily.  [name] and entresto stopped at d/c with creatinine bump. Discharge weight 128 pounds.   Admitted 7/10 through 06/28/2018. Admitted with increase dyspnea and and abdominal pain. Required short term milrinone and diuresed with IV lasix. Transitioned to torsemide 40 mg daily. GI consulted for abdominal pain. HIDDA scan was negative. Also had 20% PVCs so mexiletine was started. Discharge weight 115 pounds.   CPX 9/19 severe HF limitation with pVO2 14.2 and slope 40.   Over past few weeks she has been declining with worsening HF and renal insufficiency. She has not responded well to medication changes. Now NYHA IV with dyspnea on almost any activity. With concern for low output HF she was brought in today for RHC to further assess her hemodynamics.   Review of Systems: [y] = yes, [ ]  = no    General: Weight gain [] ; Weight loss [ ] ; Anorexia Blue.Reese ]; Fatigue [y]; Fever [ ] ; Chills [ ] ; Weakness Blue.Reese ]   Cardiac: Chest pain/pressure [ ] ; Resting SOB [ y]; Exertional SOB [y]; Orthopnea [ ] ;  Pedal Edema [] ; Palpitations [ ] ; Syncope [ ] ; Presyncope [ ] ; Paroxysmal nocturnal dyspnea[ ]    Pulmonary: Cough [ ] ; Wheezing[ ] ; Hemoptysis[ ] ; Sputum [ ] ; Snoring [ ]    GI: Vomiting[ ] ; Dysphagia[ ] ; Melena[ ] ; Hematochezia [ ] ; Heartburn[ ] ; Abdominal pain [ ] ; Constipation [ ] ; Diarrhea [ ] ; BRBPR [ ]    GU: Hematuria[ ] ; Dysuria [ ] ; Nocturia[ ]   Vascular: Pain in legs with walking [ ] ; Pain in feet with lying flat [ ] ; Non-healing sores [ ] ; Stroke [ ] ; TIA [ ] ; Slurred speech [ ] ;   Neuro: Headaches[ ] ; Vertigo[ ] ; Seizures[ ] ; Paresthesias[ ] ;Blurred vision [ ] ; Diplopia [ ] ; Vision changes [ ]    Ortho/Skin: Arthritis [y]; Joint pain [y]; Muscle pain [ ] ; Joint swelling [ ] ; Back Pain [ ] ; Rash [ ]    Psych: Depression[ y]; Anxiety[ ]    Heme: Bleeding problems [ ] ; Clotting disorders [ ] ; Anemia [ ]    Endocrine: Diabetes [ ] ; Thyroid dysfunction[ ]   CPX 08/10/18: FVC 1.96 (69%)    FEV1 1.46 (66%)     FEV1/FVC 74 (94%)     Resting HR: 83 Peak HR: 117  (77% age predicted max HR)  Peak Vo2 14.2 (64%) slope 40 Peak RER 1.11 OUES 0.81  ECHO 06/18/2018  - Left ventricle: Limited study. The cavity size was moderately   dilated. Wall thickness was normal. The estimated ejection  fraction was 15%. Diffuse hypokinesis with regional variation.   Indeterminate diastolic function. - Aortic valve: Moderately calcified annulus. Trileaflet. - Mitral valve: Mildly calcified annulus. There was moderate   regurgitation. - Left atrium: The atrium was dilated. - Right ventricle: Pacer wire or catheter noted in right ventricle.   Systolic function was mildly reduced. - Right atrium: The atrium was dilated. - Tricuspid valve: There was moderate regurgitation. - Pulmonic valve: There was mild regurgitation. - Pulmonary arteries: PA peak pressure: 45 mm Hg (S). - Pericardium, extracardiac: There was no pericardial effusion.  ECHO 06/2018 EF 15% RV mildly dilated.  Echo  09/17/16 :LVEF 25-30%, Grade 1  ABI 7/14 R 0.93 L 0.95 MRI (12/09):  High scar burden, Inf and Apical AK, EF 24%.  Lexiscan Myoview (11/14): LV Ejection Fraction: 24%. Extensive scar in the inferior wall, apex and anterior wall. Minimal reversibility Carotid Doppler : August 2015 showed 60-79% bilateral ICA stenosis. No previous CVA. Cath 07/26/14 Left main: Calcified 40% mid to distal stenosis LAD: Long vessel wrapping the apex 20-30% plaque in midsection. Distal vessel is small.  LCX: Large OM-1 and large OM-2. 50% lesion in proximal OM-2 RCA: Dominant vessel. Calcified, eccentric 50-60% lesion in midsection . LV-gram done in the RAO projection: Ejection fraction = 25% Severe global HK of mid to distal LV. No MR.   LHC 06/22/18: Left Main  Calcified and relatively short left main with about 40% stenosis.  Left Anterior Descending  40% mid LAD stenosis.  Left Circumflex  50% stenosis proximal LCx just prior to stent. Patient stent proximal LCx.  Right Coronary Artery  40% mid RCA stenosis.   RHC 06/22/18: RA mean 13 RV 48/13 PA 49/16, mean 27 PCWP mean 19 LV 110/27 AO 108/57 Oxygen saturations: PA 68% AO 98% Cardiac Output (Fick) 5.04  Cardiac Index (Fick) 3.16 PVR 1.59 WU Cardiac Output (Thermo) 3.68 Cardiac Index (Thermo) 2.31  CPX 11/14 FVC 2.08 (67%)  FEV1 1.58 (66%)  FEV1/FVC 76%  Resting HR: 84 Peak HR: 142 (91% age predicted max HR) BP rest: 114/60 BP peak: 148/70 (IPE) Peak VO2: 15.0 (63% predicted peak VO2) VE/VCO2 slope: 32 OUES: 0.85 Peak RER: 1.33 Ventilatory Threshold: 12.1 (50.8% predicted peak VO2) VE/MVV: 60.1% O2pulse: 6 (75% predicted O2pulse)  LE angiography 03/02/18 showed no signficant aortoiliac disease. Left sided showed diffuse 60-70% disease affecting the proximal SFA followed by short occlusion distally with reconstitution via collaterals from the profunda and three-vessel runoff below the knee.   Labs (10/12):  K 3.2, creatinine 0.9,  Hgb 13.5 Labs (9/14):    K 3.4, creatinine 1.0, pro BNP 1560, Hgb 11.8, HDL 25, LDL 60, ALT 29 Labs (10/14):  K 3.3=> 4.2, creatinine 1.2, BNP 1175 Labs (10/02/13) K 3.2 creatinine 0.9 Labs (12/13/13): LDL 134, AST 24, ALT 24, TC 221, TG 104 Labs (8/15): K 5.1, creatinine 1.1  Labs (11/15): LDL 79, HDL 67 Labs (3/16): K 3.9, creatinine 1.13 Lasbs (06/28/2018): K 4.5 Creatinine 1.38   Wt Readings from Last 3 Encounters:  12/22/18 51.9 kg  11/21/18 53.3 kg  10/25/18 54.3 kg     Past Medical History:  Diagnosis Date  . Arthritis    hands  . Asthma   . CAD (coronary artery disease)    a. s/p prior Ant MI, b. s/p prior POBA to LAD, Dx, RCA,;  c.  LHC (10/12):  dLAD 40, pCFX 30, OM1 50, pRCA 30;  d.  Carlton Adam Myoview (11/14):  High risk study; inferior, anterior, apical  defects; minimal reversibility toward the apex; consistent with prior infarct and very mild peri-infarct ischemia, EF 24%, inferior/apical/anterior akinesis  . Chronic systolic heart failure ([place])   . Depression   . H/O hiatal hernia   . HLD (hyperlipidemia)   . HTN (hypertension)   . Ischemic cardiomyopathy    MRI (12/09):  High scar burden, Inf and Apical AK, EF 24%.;  Echo (10/14): EF 15%, Gr 2 DD, mild to mod MR, mod LAE, RVSF mildly reduced, mod RAE, severe TR, PASP 49-53 (mod pulmonary HTN)  . MI (myocardial infarction) ([place])   . Tobacco abuse    Social History   Tobacco Use  . Smoking status: Current Every Day Smoker    Types: Cigarettes  . Smokeless tobacco: Never Used  Substance Use Topics  . Alcohol use: No  . Drug use: No   Family History  Problem Relation Age of Onset  . Stroke Mother   . Diabetes Mellitus II Sister   . Breast cancer Sister      Current Facility-Administered Medications  Medication Dose Route Frequency Provider Last Rate Last Dose  . 0.9 %  sodium chloride infusion  250 mL Intravenous PRN Julieta Rogalski, Shaune Pascal, MD      . 0.9 %  sodium chloride infusion   Intravenous Continuous  Jyla Hopf, Shaune Pascal, MD 10 mL/hr at 12/22/18 0700    . aspirin chewable tablet 81 mg  81 mg Oral Pre-Cath Evanell Redlich, Shaune Pascal, MD      . sodium chloride flush (NS) 0.9 % injection 3 mL  3 mL Intravenous Q12H Caela Huot, Shaune Pascal, MD      . sodium chloride flush (NS) 0.9 % injection 3 mL  3 mL Intravenous PRN Denney Shein, Shaune Pascal, MD       Allergies:    Allergies  Allergen Reactions  . Prednisone Shortness Of Breath, Swelling, Rash and Other (See Comments)    Sore throat, also  . Zebeta [Bisoprolol Fumarate] Other (See Comments)    Caused confusion  . Eggs Or Egg-Derived Products Other (See Comments)    Was told she is allergic to EGG WHITES  . Statins Other (See Comments)    Gradually tired with daily Lipitor, made pt feel badly (Pt can take Crestor 5 mg 4 times per week and Zetia 5 mg two times a week)   Social History:  The patient  reports that she has been smoking cigarettes. She has never used smokeless tobacco. She reports that she does not drink alcohol or use drugs.   Review of systems complete and found to be negative unless listed in HPI.    Wt Readings from Last 3 Encounters:  12/22/18 51.9 kg  11/21/18 53.3 kg  10/25/18 54.3 kg    PHYSICAL EXAM: General:  Fatigued appearing. No resp difficulty HEENT: normal Neck: supple. JVP to jaw Carotids 2+ bilat; no bruits. No lymphadenopathy or thryomegaly appreciated. Cor: PMI nondisplaced. Regular rate & rhythm. No rubs, gallops or murmurs. Lungs: clear with decreased bs throguhout Abdomen: soft, nontender, nondistended. No hepatosplenomegaly. No bruits or masses. Good bowel sounds. Extremities: no cyanosis, clubbing, rash, edema Neuro: alert & orientedx3, cranial nerves grossly intact. moves all 4 extremities w/o difficulty. Affect pleasant  ASSESSMENT AND PLAN:  1.  Chronic Systolic CHF  due to [facility] with mild RV dysfunction s/p [employer] ICD.  - Echo 06/2018 EF 15%   - worsening NYHA IV symptoms with worsening renal  function concern for low output. VAD w/u underway.  - CPX 9/19  with severe functional limitation due predominantly to  HF. - Plan RHC today  2. CAD s/p previous anterior MI:  Cath 8/15 nonobstructive CAD.   - Occasional CP in high-stress situations. But none currently. Stable pattern. Cath 7/19 non-obstructive CAD.  - Continue ASA and statin. Continue imdur.   3. COPD with ongoing tobacco abuse:  - As above, discussed smoking cessation. No change  4. AKI  - peak creatinine 2.1. now back to 1.65 - ? Cardiorenal component  Terri Bickers, MD  7:37 AM

## 2018-12-22 NOTE — Interval H&P Note (Signed)
History and Physical Interval Note:  12/22/2018 7:41 AM  [name]  has presented today for surgery, with the diagnosis of chf  The various methods of treatment have been discussed with the patient and family. After consideration of risks, benefits and other options for treatment, the patient has consented to  Procedure(s): RIGHT HEART CATH (N/A) as a surgical intervention .  The patient's history has been reviewed, patient examined, no change in status, stable for surgery.  I have reviewed the patient's chart and labs.  Questions were answered to the patient's satisfaction.     Britt Petroni

## 2018-12-22 NOTE — Progress Notes (Signed)
Home Paraenteral Inotropic Therapy : Data Collection Form  Patients name: [name]   Date: 12/22/18  Information below may not be completed by the supplier nor anyone in a Financial relationship with the supplier.  1. Results of invasive hemodynamic monitoring  Cardiac Index Before Inotrope infusion:            1.7               On Inotrope infusion:            1.97               Drug and dose:  Milrinone 0.25 mcg/kg/min  2. Cardiac medications immediately prior to inotrope infusion (List name, dose, and frequency) Aspirin 81 mg daily Digoxin 0.125 mg daily Imdur 30 mg daily Losartan 25 mg daily Mexilitine 150 mg TID Crestor 5 mg daily Spironolactone 25 mg daily Torsemide 40 mg BID Metolazone 2.5 mg PRN  3. Dose this represent maximum tolerated doses of these medications? Yes.   4. Breathing status Prior to inotrope infusion: Dyspnea at rest  At time of discharge: Dyspnea on moderate exertion.   5. Initial home prescription Drug and Dose:  Milrinone 0.25 mcg/kg/min for continuous infusion 24/hr day and 7 days/week  6. If continuous infusion is prescribed, have attempts to discontinue inotrope infusion in the hospital failed?   Yes.   7. If intermittent infusion is prescribed, have there been repeated hospitalizations for heart failure which Parenteral inotrope were required? Not applicable.   8. Is patient capable of going to the physician for outpatient evaluation? Yes.   9. Is routine electrocardiographic monitoring required in the Home?  No.   The above statements and any additional explanations included separately are true and accurate and there is documentation present in the patients medical record to support these statements.   Completed by Georgiana Shore, NP   In instances where this form was completed by an Advanced Practice Provider, please see EMR for physician Co-Signature.

## 2018-12-22 NOTE — Progress Notes (Signed)
CSW referred to meet with patient and discuss LVAD Psychosocial assessment. CSW met with patient and sister Romie Minus at bedside. Patient reports she lives alone and has 2 children in the area. CSW discussed assessment process, LVAD Support Group and will complete full LVAD assessment on Monday January 20th @ 10am at bedside if patient still hospitalized. Patient reports she is unsure about the LVAD but open to further discussions. CSW will follow up on Monday. Raquel Sarna, [place], [place]

## 2018-12-23 ENCOUNTER — Inpatient Hospital Stay (HOSPITAL_COMMUNITY): Payer: Medicare Other

## 2018-12-23 ENCOUNTER — Encounter (HOSPITAL_COMMUNITY): Payer: Self-pay | Admitting: Diagnostic Radiology

## 2018-12-23 DIAGNOSIS — I5023 Acute on chronic systolic (congestive) heart failure: Secondary | ICD-10-CM

## 2018-12-23 HISTORY — PX: IR US GUIDE VASC ACCESS RIGHT: IMG2390

## 2018-12-23 HISTORY — PX: IR FLUORO GUIDE CV LINE RIGHT: IMG2283

## 2018-12-23 LAB — BASIC METABOLIC PANEL
Anion gap: 9 (ref 5–15)
BUN: 32 mg/dL — AB (ref 8–23)
CO2: 27 mmol/L (ref 22–32)
Calcium: 9 mg/dL (ref 8.9–10.3)
Chloride: 101 mmol/L (ref 98–111)
Creatinine, Ser: 1.41 mg/dL — ABNORMAL HIGH (ref 0.44–1.00)
GFR calc Af Amer: 44 mL/min — ABNORMAL LOW (ref >60)
GFR calc non Af Amer: 38 mL/min — ABNORMAL LOW (ref >60)
Glucose, Bld: 108 mg/dL — ABNORMAL HIGH (ref 70–99)
Potassium: 3.6 mmol/L (ref 3.5–5.1)
Sodium: 137 mmol/L (ref 135–145)

## 2018-12-23 LAB — PULMONARY FUNCTION TEST
DL/VA % pred: 74 %
DL/VA: 3.51 ml/min/mmHg/L
DLCO cor % pred: 36 %
DLCO cor: 8.54 ml/min/mmHg
DLCO unc % pred: 33 %
DLCO unc: 7.93 ml/min/mmHg
FEF 25-75 Post: 1.85 L/sec
FEF 25-75 Pre: 1 L/sec
FEF2575-%Change-Post: 84 %
FEF2575-%Pred-Post: 96 %
FEF2575-%Pred-Pre: 52 %
FEV1-%Change-Post: 18 %
FEV1-%Pred-Post: 62 %
FEV1-%Pred-Pre: 52 %
FEV1-PRE: 1.19 L
FEV1-Post: 1.41 L
FEV1FVC-%Change-Post: -5 %
FEV1FVC-%Pred-Pre: 104 %
FEV6-%Change-Post: 24 %
FEV6-%Pred-Post: 65 %
FEV6-%Pred-Pre: 52 %
FEV6-Post: 1.85 L
FEV6-Pre: 1.48 L
FEV6FVC-%Change-Post: 0 %
FEV6FVC-%Pred-Post: 103 %
FEV6FVC-%Pred-Pre: 104 %
FVC-%Change-Post: 26 %
FVC-%PRED-PRE: 49 %
FVC-%Pred-Post: 63 %
FVC-Post: 1.87 L
FVC-Pre: 1.48 L
Post FEV1/FVC ratio: 75 %
Post FEV6/FVC ratio: 99 %
Pre FEV1/FVC ratio: 80 %
Pre FEV6/FVC Ratio: 100 %
RV % PRED: 128 %
RV: 2.75 L
TLC % pred: 92 %
TLC: 4.58 L

## 2018-12-23 LAB — COOXEMETRY PANEL
Carboxyhemoglobin: 1.8 % — ABNORMAL HIGH (ref 0.5–1.5)
Methemoglobin: 1.6 % — ABNORMAL HIGH (ref 0.0–1.5)
O2 Saturation: 60.3 %
Total hemoglobin: 12.6 g/dL (ref 12.0–16.0)

## 2018-12-23 LAB — CUP PACEART REMOTE DEVICE CHECK
Battery Voltage: 2.65 V
Brady Statistic RV Percent Paced: 0 %
Date Time Interrogation Session: 20191127093726
HighPow Impedance: 82 Ohm
Implantable Lead Location: 753860
Implantable Lead Model: 6935
Implantable Pulse Generator Implant Date: 20100212
Lead Channel Impedance Value: 520 Ohm
Lead Channel Sensing Intrinsic Amplitude: 10.856 mV
Lead Channel Setting Pacing Pulse Width: 0.4 ms
Lead Channel Setting Sensing Sensitivity: 0.3 mV
MDC IDC LEAD IMPLANT DT: 20100212
MDC IDC SET LEADCHNL RV PACING AMPLITUDE: 2.5 V

## 2018-12-23 LAB — PROTIME-INR
INR: 1.04
Prothrombin Time: 13.5 seconds (ref 11.4–15.2)

## 2018-12-23 LAB — HEPATITIS C ANTIBODY: HCV Ab: <0.1 s/co ratio (ref 0.0–0.9)

## 2018-12-23 LAB — MAGNESIUM: Magnesium: 2.3 mg/dL (ref 1.7–2.4)

## 2018-12-23 MED ORDER — HEPARIN SOD (PORK) LOCK FLUSH 100 UNIT/ML IV SOLN
INTRAVENOUS | Status: AC
  Filled 2018-12-23: qty 5

## 2018-12-23 MED ORDER — ENSURE ENLIVE PO LIQD: 237.0000 mL | Freq: Two times a day (BID) | ORAL | 12 refills | Status: DC

## 2018-12-23 MED ORDER — HEPARIN SOD (PORK) LOCK FLUSH 100 UNIT/ML IV SOLN
INTRAVENOUS | Status: DC | PRN
  Administered 2018-12-23: 300 [IU]

## 2018-12-23 MED ORDER — MILRINONE LACTATE IN DEXTROSE 20-5 MG/100ML-% IV SOLN: 0.2500 ug/kg/min | INTRAVENOUS | 11 refills | Status: AC

## 2018-12-23 MED ORDER — LIDOCAINE HCL 1 % IJ SOLN
INTRAMUSCULAR | Status: AC
  Filled 2018-12-23: qty 20

## 2018-12-23 MED ORDER — LIDOCAINE HCL 1 % IJ SOLN
INTRAMUSCULAR | Status: DC | PRN
  Administered 2018-12-23: 15 mL via INTRADERMAL

## 2018-12-23 MED ORDER — ENSURE ENLIVE PO LIQD: 237.0000 mL | Freq: Two times a day (BID) | ORAL | Status: DC

## 2018-12-23 NOTE — Progress Notes (Signed)
Initial Nutrition Assessment  DOCUMENTATION CODES:   Non-severe (moderate) malnutrition in context of chronic illness  INTERVENTION:  -Ensure Enlive po BID, each supplement provides 350 kcal and 20 grams of protein  -Encouraged pt to choose foods she likes off menu.  NUTRITION DIAGNOSIS:   Moderate Malnutrition related to chronic illness(CHF, COPD) as evidenced by moderate fat depletion, moderate muscle depletion  GOAL:   Patient will meet greater than or equal to 90% of their needs  MONITOR:   PO intake, Weight trends, Supplement acceptance, Skin, I & O's, Labs  REASON FOR ASSESSMENT:   Consult LVAD Eval  ASSESSMENT:   Pt with PMH of CAD, HTN, HLD, depression, COPD, CKD, ischemic cardiomyopathy and CHF. Continued tobacco use. Admitted 1/16 for low output HF for RHC to further assess.   Pt was awake and sitting up when we arrived. Pt reported that her appetite has been okay; she recently had an ear and sinus infection (last week) and thinks that has contributed to taste change and decreased appetite. Pt reported during dietary recall that she has 2 meals a day, and snacks throughout the day. First meal is 11am and consists of eggs, toast, cereal, or hamburgers. She has coffee with cream at that meal. Next meal is 5pm and might be beef stew (with carrots, onion, and potatoes) and biscuits. She enjoys collards with dinner but does not like broccoli or spinach. She may drink water, sweet tea, juice or coffee with dinner. Pt snacks often throughout the day; snacks can be peanuts, a cookie, fruit (peaches, mandarins). Pt reports that she enjoys fresh fruits and not canned fruits.  Pt reported that she drinks 2-16oz bottles of water each day. Observed on pt table unopened cans of sprite, diet coke, and caffeine free coke; pt said she does not drink soda often but likes sprite. Pt reported that she ate about 50% of dinner last night (1/16) and per pt chart 50% breakfast today.   No N/V  recently. Pt does report that she has been feeling SOB and that affects her mobility. She also said she was retaining water before admission and that it made her not want to move around as much. Pt reported UBW 112#. Per pt chart 3% wt loss since 12/16, which is not significant for time frame. However, is concerning giving decrease intake and mild-moderate muscle depletion on exam.  Pt agrees to begin drinking Ensure between meals. Explained to pt that she is able to order off back of the menu if house meals do not suit her wants.   Medications reviewed and include: pantoprazole Labs reviewed  NUTRITION - FOCUSED PHYSICAL EXAM:    Most Recent Value  Orbital Region  Mild depletion  Upper Arm Region  Moderate depletion  Thoracic and Lumbar Region  Mild depletion  Buccal Region  Moderate depletion  Temple Region  Moderate depletion  Clavicle Bone Region  Moderate depletion  Clavicle and Acromion Bone Region  Moderate depletion  Scapular Bone Region  Moderate depletion  Dorsal Hand  Moderate depletion  Patellar Region  Mild depletion  Anterior Thigh Region  No depletion  Posterior Calf Region  No depletion  Edema (RD Assessment)  None  Hair  Reviewed  Eyes  Reviewed  Mouth  Reviewed  Skin  Reviewed  Nails  Reviewed       Diet Order:   Diet Order            Diet NPO time specified Except for: Sips with Meds  Diet  effective now              EDUCATION NEEDS:   Education needs have been addressed  Skin:  Skin Assessment: Skin Integrity Issues: Skin Integrity Issues:: Stage I Stage I: Right heel, skin intact; redness present.  Last BM:  Unknown  Height:   Ht Readings from Last 1 Encounters:  12/22/18 5\' 3"  (1.6 m)    Weight:   Wt Readings from Last 1 Encounters:  12/22/18 51.9 kg    Ideal Body Weight:  52.27 kg  BMI:  Body mass index is 20.25 kg/m.  Estimated Nutritional Needs:   Kcal:  1600-1800 kcal  Protein:  80-90  Fluid:  1.6-1.8L    [employer]

## 2018-12-23 NOTE — Care Management Note (Signed)
Case Management Note Cross Coverage for 2C on 12/23/18 [place], [place] [phone]  Patient Details  Name: [name] MRN: [id number] Date of Birth: Dec 11, 1950  Subjective/Objective:  Pt admitted with acute on chronic HF, LVAD work up in progress, IV milrinone started                  Action/Plan: PTA pt lived at home with family, plan to return home, [facility] orders placed with plans for home IV milrinone- [facility] has been contacted per HF team for home IV milrinone needs- CM spoke with pt at bedside for [facility] agency choice- list provided to pt Per CMS website with star ratings (copy placed in shadow chart)- pt has elected to use [facility] for all [facility] service needs- have notified both Butch Penny and [place] with [facility] for [facility] referral- Pam with [facility] will follow for home IV milrinone and connect pt to home pump prior to discharge at the bedside. Pt denies any DME needs at this time and will f/u with HF clinic. Plan to d/c home once PICC line placed possibly later today vs tomorrow.   Expected Discharge Date:                  Expected Discharge Plan:  [place]  In-House Referral:  NA  Discharge planning Services  CM Consult  Post Acute Care Choice:  Home Health Choice offered to:  Patient  DME Arranged:    DME Agency:     HH Arranged:  RN, Disease Management [place] Agency:  [place]  Status of Service:  Completed, signed off  If discussed at [place] of Stay Meetings, dates discussed:    Discharge Disposition: home/home health   Additional Comments:  Dawayne Patricia, RN 12/23/2018, 1:12 PM

## 2018-12-23 NOTE — Discharge Summary (Addendum)
Advanced Heart Failure Discharge Note  Discharge Summary   Patient ID: [name] MRN: [id number], DOB/AGE: [birth year] 68 y.o. Admit date: 12/22/2018 D/C date:     12/23/2018    Primary Discharge Diagnoses:  1. Acute on chronic systolic HF - [employer] ICD - Started on milrinone 0.25 mcg/kg/min this admit. [facility] following.  2. CAD 3. COPD 4. AKI  Hospital Course:  [name] is a 68 y.o. 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   She was admitted after [place] 12/22/17 that showed low output with CI 1.7. She was started on milrinone 0.25 mcg/kg/min with improvement. A tunneled line was placed. [facility] will follow for home inotropes.   She had AKI that resolved with initiation of milrinone. Repeat PFTs were obtained as part of LVAD work up/consideration.   She will be followed closely in VAD clinic, with appointment as below. [facility] will follow for home milrinone.   Discharge Weight Range: 114 lbs.  Discharge Vitals: Blood pressure (!) 104/55, pulse 85, temperature 97.9 F (36.6 C), temperature source Oral, resp. rate 20, height 5\' 3"  (1.6 m), weight 51.9 kg, SpO2 93 %.  Labs: Lab Results  Component Value Date   WBC 7.7 12/22/2018   HGB 11.3 (L) 12/22/2018   HCT 35.4 (L) 12/22/2018   MCV 91.0 12/22/2018   PLT 156 12/22/2018    Recent Labs  Lab 12/23/18 0126  NA 137  K 3.6  CL 101  CO2 27  BUN 32*  CREATININE 1.41*  CALCIUM 9.0  GLUCOSE 108*   Lab Results  Component Value Date   CHOL 87 12/22/2018   HDL 39 (L) 12/22/2018   LDLCALC 42 12/22/2018   TRIG 31 12/22/2018   BNP (last 3 results) Recent Labs    06/18/18 0601 07/06/18 1621 09/14/18 1143  BNP 2,926.1* 1,705.1* 1,056.5*    ProBNP (last 3 results) No results for input(s): PROBNP in the last 8760 hours.   Diagnostic Studies/Procedures   [facility] 12/22/18: RA = 8 RV = 56/10 PA = 56/20  (35) PCW = 20 Fick cardiac output/index = 2.7/1.7 PVR = 5.6 WU PaPI =4.5 Ao sat = 96% PA sat = 47%, 48%  PFTs 12/23/18: FVC: 1.87 (63%) FEV1: 1.19 (52%) FEV1/FVC :75 DLCO cor: 36%  Discharge Medications   Allergies as of 12/23/2018      Reactions   Prednisone Shortness Of Breath, Swelling, Rash, Other (See Comments)   Sore throat, also   Zebeta [bisoprolol Fumarate] Other (See Comments)   Caused confusion   Eggs Or Egg-derived Products Other (See Comments)   Was told she is allergic to EGG WHITES   Statins Other (See Comments)   Gradually tired with daily Lipitor, made pt feel badly (Pt can take Crestor 5 mg 4 times per week and Zetia 5 mg two times a week)      Medication List    STOP taking these medications   levofloxacin 250 MG tablet Commonly known as:  LEVAQUIN   metolazone 2.5 MG tablet Commonly known as:  ZAROXOLYN     TAKE these medications   aspirin 81 MG tablet Take 81 mg by mouth daily.   azelastine 0.1 % nasal spray Commonly known as:  ASTELIN Place 1 spray into both nostrils daily.   citalopram 20 MG tablet Commonly known as:  CELEXA Take 20 mg by  mouth daily.   clonazePAM 0.5 MG tablet Commonly known as:  KLONOPIN Take 0.25-0.5 mg by mouth 2 (two) times daily as needed.   digoxin 0.125 MG tablet Commonly known as:  LANOXIN TAKE 1/2 TABLET BY MOUTH EVERY DAY   ezetimibe 10 MG tablet Commonly known as:  ZETIA TAKE 1/2 TABLET TWICE A WEEK What changed:  See the new instructions.   feeding supplement (ENSURE ENLIVE) Liqd Take 237 mLs by mouth 2 (two) times daily between meals.   fish oil-omega-3 fatty acids 1000 MG capsule Take 2 g by mouth daily.   fluticasone 50 MCG/ACT nasal spray Commonly known as:  FLONASE Place 2 sprays into both nostrils daily.   isosorbide mononitrate 30 MG 24 hr tablet Commonly known as:  IMDUR TAKE 1 TABLET BY MOUTH EVERY DAY   loratadine 10 MG tablet Commonly known as:  CLARITIN Take 10 mg by mouth  daily.   losartan 25 MG tablet Commonly known as:  COZAAR TAKE 0.5 TABLETS (12.5 MG TOTAL) BY MOUTH 2 (TWO) TIMES DAILY.   mexiletine 150 MG capsule Commonly known as:  MEXITIL Take 1 capsule (150 mg total) by mouth 2 (two) times daily.   milrinone 20 MG/100 ML Soln infusion Commonly known as:  PRIMACOR Inject 0.013 mg/min into the vein continuous.   nicotine 14 mg/24hr patch Commonly known as:  NICODERM CQ - dosed in mg/24 hours Place 1 patch (14 mg total) onto the skin daily.   nicotine polacrilex 4 MG gum Commonly known as:  NICORETTE Take 1 each (4 mg total) by mouth as needed for smoking cessation.   nitroGLYCERIN 0.4 MG SL tablet Commonly known as:  NITROSTAT Place 1 tablet (0.4 mg total) under the tongue every 5 (five) minutes as needed for chest pain.   omeprazole 40 MG capsule Commonly known as:  PRILOSEC Take 40 mg by mouth daily.   polyethylene glycol packet Commonly known as:  MIRALAX / GLYCOLAX Take 17 g by mouth at bedtime.   potassium chloride SA 20 MEQ tablet Commonly known as:  K-DUR,KLOR-CON Take 1 tablet (20 mEq total) by mouth daily.   PROAIR HFA 108 (90 Base) MCG/ACT inhaler Generic drug:  albuterol Inhale 2 puffs into the lungs every 6 (six) hours as needed for wheezing.   rosuvastatin 5 MG tablet Commonly known as:  CRESTOR Take 1 tablet by mouth on Monday, Wednesday, and Friday What changed:    how much to take  how to take this  when to take this  additional instructions   spironolactone 25 MG tablet Commonly known as:  ALDACTONE Take 1 tablet (25 mg total) by mouth daily.   SYMBICORT 160-4.5 MCG/ACT inhaler Generic drug:  budesonide-formoterol Inhale 2 puffs into the lungs daily.   torsemide 20 MG tablet Commonly known as:  DEMADEX Take 2 tablets (40 mg total) by mouth 2 (two) times daily.   TYLENOL ARTHRITIS PAIN 650 MG CR tablet Generic drug:  acetaminophen Take 650 mg by mouth every 8 (eight) hours as needed for pain.             Durable Medical Equipment  (From admission, onward)         Start     Ordered   12/23/18 1034  Heart failure home health orders  (Heart failure home health orders / Face to face)  Once    Comments:  Heart Failure Follow-up Care:  Verify follow-up appointments per Patient Discharge Instructions. Confirm transportation arranged. Reconcile home medications with discharge medication  list. Remove discontinued medications from use. Assist patient/caregiver to manage medications using pill box. Reinforce low sodium food selection Assessments: Vital signs and oxygen saturation at each visit. Assess home environment for safety concerns, caregiver support and availability of low-sodium foods. Consult Education officer, museum, PT/OT, Dietitian, and CNA based on assessments. Perform comprehensive cardiopulmonary assessment. Notify MD for any change in condition or weight gain of 3 pounds in one day or 5 pounds in one week with symptoms. Daily Weights and Symptom Monitoring: Ensure patient has access to scales. Teach patient/caregiver to weigh daily before breakfast and after voiding using same scale and record.    Teach patient/caregiver to track weight and symptoms and when to notify Provider. Activity: Develop individualized activity plan with patient/caregiver.  HHRN 2 wk 2 for CP assessment   [facility] to provide  Labs every other week to include BMET, Mg, and CBC with Diff. Additional as needed. Should be drawn via PERIPHERAL stick. NOT PICC line.   V6720 Milrinone 0.25 mcg/kg/min X 52 weeks A4221 Supplies for maintenance of drug infusion catheter A4222 Supplies for the external drug infusion per cassette or bag E0781 Ambulatory Infusion pump  Question Answer Comment  Heart Failure Follow-up Care [facility] at [phone]   Obtain the following labs Other see comments   Lab frequency Other see comments   Fax lab results to AHF Clinic at [phone]   Diet Low  Sodium Heart Healthy   Fluid restrictions: 2000 mL Fluid   Initiate Heart Failure Clinic Diuretic Protocol to be used by [facility] only ( to be ordered by Heart Failure Team Providers Only) Yes      12/23/18 1035          Disposition   The patient will be discharged in stable condition to home. Discharge Instructions    (HEART FAILURE PATIENTS) Call MD:  Anytime you have any of the following symptoms: 1) 3 pound weight gain in 24 hours or 5 pounds in 1 week 2) shortness of breath, with or without a dry hacking cough 3) swelling in the hands, feet or stomach 4) if you have to sleep on extra pillows at night in order to breathe.   Complete by:  As directed    Call MD for:  persistant dizziness or light-headedness   Complete by:  As directed    Call MD for:  redness, tenderness, or signs of infection (pain, swelling, redness, odor or green/yellow discharge around incision site)   Complete by:  As directed    Call MD for:  temperature >100.4   Complete by:  As directed    Diet - low sodium heart healthy   Complete by:  As directed    Heart Failure patients record your daily weight using the same scale at the same time of day   Complete by:  As directed    Increase activity slowly   Complete by:  As directed    STOP any activity that causes chest pain, shortness of breath, dizziness, sweating, or exessive weakness   Complete by:  As directed      Follow-up Information    Keonda Dow, Shaune Pascal, MD Follow up on 01/02/2019.   Specialty:  Cardiology Why:  at 1 pm. Hospital follow up in [place] [facility]. Garage code for January is [id number]. Bring all medications to appointment.  Contact information: [address] [place] [address] [place], [place] Follow  up.   Specialty:  [place] Why:  [facility] for disease management and home IV milrinone needs- they will call you to set up home visits [facility] will hookup home IV  milrinone prior to discharge once PICC line placed) Contact information: [address] [place] [address] [phone]             Duration of Discharge Encounter: Greater than 35 minutes   Signed, Georgiana Shore, NP 12/23/2018, 4:05 PM   Agree with above. [facility] suggest low output HF. Will start milrinone. DLCO toolow for VAD needs to stop smoking to qualify.   Glori Bickers, MD  1:17 PM

## 2018-12-23 NOTE — Procedures (Signed)
Interventional Radiology Procedure:   Indications: Chronic systolic heart failure, needs line for milrinone.   Procedure: Tunneled central line placement  Findings: Tip at SVC/RA junction.   Single lumen length 24 cm.    Complications: None     EBL: Minimal  Plan: Central line is ready to use.     [name] R. Anselm Pancoast, MD  Pager: [phone]

## 2018-12-23 NOTE — Progress Notes (Signed)
Pt d/c home with IV milrinone after picc line placed. Discharge summary/intructions given to pt and family. All questions answered.

## 2018-12-23 NOTE — Consult Note (Signed)
[address]       [place] [address]             [phone]        [name] Medical Record #[id number] Date of Birth: 07-May-1951  Referring: No ref. provider found Primary Care: Stephens Shire, MD Primary Cardiologist:Peter Johnsie Cancel, MD  Chief Complaint:   Fatigue, shortness of breath  History of Present Illness:     Patient examined, images of most recent echocardiogram and chest CT scan and data from right heart cath and PFTs personally reviewed and discussed with patient.  Petite 68 year old female ischemic cardiomyopathy ejection fraction 15% and mild-moderate RV dysfunction with moderate TR followed by the advanced heart failure service.  She has been admitted for heart failure several occasions over the past year and a half.  She has had a previous anterior MI remotely.  She has had an AICD implanted but has not been shocked.  She has been found to be in low cardiac output with co-oximetry 44% and she is now on home milrinone through a tunneled central line.  She lives by herself and functions fairly well.  She has had no major surgery in the past.  She is in sinus rhythm not anticoagulated.  She has no history of GI bleeding and she is not a diabetic.  Concerns regarding LVAD therapy for this patient include her short stature and BSA of 1.6, COPD with active smoking and her PFTs have shown a deterioration the past year with DLCO now only 33%, down from 52% 3 months ago.  The patient appears to be a satisfactory candidate for mechanical support if she would stop smoking and allow her lungs to clear bronchitis for 6 weeks to 2 months.  She is recently been on antibiotics-Levaquin.   Current Activity/ Functional Status: Lives alone but has supportive family   Zubrod Score: At the time of surgery this patient's most appropriate activity status/level should be described as: []     0    Normal activity, no symptoms []     1    Restricted in  physical strenuous activity but ambulatory, able to do out light work [x]     2    Ambulatory and capable of self care, unable to do work activities, up and about                 more than 50%  Of the time                            []     3    Only limited self care, in bed greater than 50% of waking hours []     4    Completely disabled, no self care, confined to bed or chair []     5    Moribund  Past Medical History:  Diagnosis Date  . Arthritis    hands  . Asthma   . CAD (coronary artery disease)    a. s/p prior Ant MI, b. s/p prior POBA to LAD, Dx, RCA,;  c.  LHC (10/12):  dLAD 40, pCFX 30, OM1 50, pRCA 30;  d.  [facility] Myoview (11/14):  High risk study; inferior, anterior, apical defects; minimal reversibility toward the apex; consistent with prior infarct and very mild peri-infarct ischemia, EF 24%, inferior/apical/anterior akinesis  . CHF (congestive heart failure) ([place])   . Chronic systolic heart failure ([place])   .  CKD (chronic kidney disease), stage III ([place])   . Depression   . H/O hiatal hernia   . HLD (hyperlipidemia)   . HTN (hypertension)   . Ischemic cardiomyopathy    MRI (12/09):  High scar burden, Inf and Apical AK, EF 24%.;  Echo (10/14): EF 15%, Gr 2 DD, mild to mod MR, mod LAE, RVSF mildly reduced, mod RAE, severe TR, PASP 49-53 (mod pulmonary HTN)  . MI (myocardial infarction) ([place])   . Tobacco abuse     Past Surgical History:  Procedure Laterality Date  . ABDOMINAL AORTOGRAM W/LOWER EXTREMITY N/A 03/02/2018   Procedure: ABDOMINAL AORTOGRAM W/LOWER EXTREMITY;  Surgeon: Wellington Hampshire, MD;  Location: [facility];  Service: Cardiovascular;  Laterality: N/A;  . CARDIAC DEFIBRILLATOR PLACEMENT  01/18/2009   MDT single chamber ICD implanted by Dr Rayann Heman   . cardiac stents    . COLONOSCOPY  03/18/2012   Procedure: COLONOSCOPY;  Surgeon: Beryle Beams, MD;  Location: [facility];  Service: Endoscopy;  Laterality: N/A;  . CORONARY ANGIOPLASTY    .  ESOPHAGOGASTRODUODENOSCOPY N/A 03/10/2013   Procedure: ESOPHAGOGASTRODUODENOSCOPY (EGD);  Surgeon: Beryle Beams, MD;  Location: [facility];  Service: Endoscopy;  Laterality: N/A;  . LEFT HEART CATHETERIZATION WITH CORONARY ANGIOGRAM N/A 07/26/2014   Procedure: LEFT HEART CATHETERIZATION WITH CORONARY ANGIOGRAM;  Surgeon: Jolaine Artist, MD;  Location: [facility];  Service: Cardiovascular;  Laterality: N/A;  . RIGHT HEART CATH N/A 12/22/2018   Procedure: RIGHT HEART CATH;  Surgeon: Jolaine Artist, MD;  Location: [facility];  Service: Cardiovascular;  Laterality: N/A;  . RIGHT/LEFT HEART CATH AND CORONARY ANGIOGRAPHY N/A 06/22/2018   Procedure: RIGHT/LEFT HEART CATH AND CORONARY ANGIOGRAPHY;  Surgeon: Larey Dresser, MD;  Location: [facility];  Service: Cardiovascular;  Laterality: N/A;    Social History   Tobacco Use  Smoking Status Current Every Day Smoker  . Years: 40.00  . Types: Cigarettes  Smokeless Tobacco Never Used  Tobacco Comment   I am cutting back    Social History   Substance and Sexual Activity  Alcohol Use No     Allergies  Allergen Reactions  . Prednisone Shortness Of Breath, Swelling, Rash and Other (See Comments)    Sore throat, also  . Zebeta [Bisoprolol Fumarate] Other (See Comments)    Caused confusion  . Eggs Or Egg-Derived Products Other (See Comments)    Was told she is allergic to EGG WHITES  . Statins Other (See Comments)    Gradually tired with daily Lipitor, made pt feel badly (Pt can take Crestor 5 mg 4 times per week and Zetia 5 mg two times a week)    Current Facility-Administered Medications  Medication Dose Route Frequency Provider Last Rate Last Dose  . 0.9 %  sodium chloride infusion  250 mL Intravenous PRN Bensimhon, Shaune Pascal, MD      . acetaminophen (TYLENOL) tablet 650 mg  650 mg Oral Q4H PRN Bensimhon, Shaune Pascal, MD      . albuterol (PROVENTIL) (2.5 MG/3ML) 0.083% nebulizer solution 2.5 mg  2.5 mg Nebulization  Q6H PRN Bensimhon, Shaune Pascal, MD   2.5 mg at 12/23/18 0837  . aspirin EC tablet 81 mg  81 mg Oral Daily Bensimhon, Shaune Pascal, MD   81 mg at 12/23/18 1028  . azelastine (ASTELIN) 0.1 % nasal spray 1 spray  1 spray Each Nare Daily Bensimhon, Shaune Pascal, MD   1 spray at 12/23/18 1035  .  citalopram (CELEXA) tablet 20 mg  20 mg Oral Daily Bensimhon, Shaune Pascal, MD   20 mg at 12/23/18 1028  . clonazePAM (KLONOPIN) disintegrating tablet 0.25 mg  0.25 mg Oral BID PRN Bensimhon, Shaune Pascal, MD      . digoxin (LANOXIN) tablet 0.0625 mg  0.0625 mg Oral Daily Einar Grad, RPH   0.0625 mg at 12/23/18 1028  . enoxaparin (LOVENOX) injection 30 mg  30 mg Subcutaneous Q24H Monia Sabal, PA-C      . [START ON 12/24/2018] ezetimibe (ZETIA) tablet 5 mg  5 mg Oral Once per day on Sun Sat Einar Grad, [facility]      . feeding supplement (ENSURE ENLIVE) (ENSURE ENLIVE) liquid 237 mL  237 mL Oral BID BM Bensimhon, Shaune Pascal, MD      . fluticasone (FLONASE) 50 MCG/ACT nasal spray 2 spray  2 spray Each Nare Daily Bensimhon, Shaune Pascal, MD   2 spray at 12/23/18 1036  . heparin lock flush 100 UNIT/ML injection           . heparin lock flush 100 unit/mL    PRN Markus Daft, MD   300 Units at 12/23/18 1648  . isosorbide mononitrate (IMDUR) 24 hr tablet 30 mg  30 mg Oral Daily Bensimhon, Shaune Pascal, MD   30 mg at 12/23/18 1028  . lidocaine (XYLOCAINE) 1 % (with pres) injection           . lidocaine (XYLOCAINE) 1 % (with pres) injection    PRN Markus Daft, MD   15 mL at 12/23/18 1640  . loratadine (CLARITIN) tablet 10 mg  10 mg Oral Daily Bensimhon, Shaune Pascal, MD   10 mg at 12/23/18 1028  . losartan (COZAAR) tablet 12.5 mg  12.5 mg Oral BID Bensimhon, Shaune Pascal, MD   12.5 mg at 12/23/18 1028  . mexiletine (MEXITIL) capsule 150 mg  150 mg Oral BID Bensimhon, Shaune Pascal, MD   150 mg at 12/23/18 1028  . milrinone (PRIMACOR) 20 MG/100 ML (0.2 mg/mL) infusion  0.25 mcg/kg/min Intravenous Continuous Georgiana Shore, NP 3.89 mL/hr at 12/22/18 1212  0.25 mcg/kg/min at 12/22/18 1212  . mometasone-formoterol (DULERA) 200-5 MCG/ACT inhaler 2 puff  2 puff Inhalation BID Bensimhon, Shaune Pascal, MD   2 puff at 12/23/18 0926  . nicotine (NICODERM CQ - dosed in mg/24 hours) patch 14 mg  14 mg Transdermal Q24H Einar Grad, RPH   14 mg at 12/23/18 1032  . omega-3 acid ethyl esters (LOVAZA) capsule 2 g  2 g Oral Daily Einar Grad, RPH   2 g at 12/23/18 1027  . ondansetron (ZOFRAN) injection 4 mg  4 mg Intravenous Q6H PRN Bensimhon, Shaune Pascal, MD      . pantoprazole (PROTONIX) EC tablet 40 mg  40 mg Oral Daily Bensimhon, Shaune Pascal, MD   40 mg at 12/23/18 1028  . polyethylene glycol (MIRALAX / GLYCOLAX) packet 17 g  17 g Oral QHS Bensimhon, Daniel R, MD      . potassium chloride SA (K-DUR,KLOR-CON) CR tablet 20 mEq  20 mEq Oral Daily Bensimhon, Shaune Pascal, MD   20 mEq at 12/23/18 1028  . rosuvastatin (CRESTOR) tablet 5 mg  5 mg Oral Once per day on Mon Wed Fri Bensimhon, Shaune Pascal, MD   5 mg at 12/23/18 1041  . sodium chloride flush (NS) 0.9 % injection 3 mL  3 mL Intravenous Q12H Bensimhon, Shaune Pascal, MD      . sodium chloride  flush (NS) 0.9 % injection 3 mL  3 mL Intravenous PRN Bensimhon, Shaune Pascal, MD      . spironolactone (ALDACTONE) tablet 25 mg  25 mg Oral Daily Bensimhon, Shaune Pascal, MD   25 mg at 12/23/18 1028  . torsemide (DEMADEX) tablet 40 mg  40 mg Oral BID Bensimhon, Shaune Pascal, MD   40 mg at 12/23/18 1831    Medications Prior to Admission  Medication Sig Dispense Refill Last Dose  . acetaminophen (TYLENOL ARTHRITIS PAIN) 650 MG CR tablet Take 650 mg by mouth every 8 (eight) hours as needed for pain.    Past Month at Unknown time  . albuterol (PROAIR HFA) 108 (90 Base) MCG/ACT inhaler Inhale 2 puffs into the lungs every 6 (six) hours as needed for wheezing.    12/22/2018 at Unknown time  . aspirin 81 MG tablet Take 81 mg by mouth daily.     12/22/2018 at Unknown time  . azelastine (ASTELIN) 0.1 % nasal spray Place 1 spray into both nostrils  daily.    12/22/2018 at Unknown time  . budesonide-formoterol (SYMBICORT) 160-4.5 MCG/ACT inhaler Inhale 2 puffs into the lungs daily.    12/21/2018 at Unknown time  . citalopram (CELEXA) 20 MG tablet Take 20 mg by mouth daily.   12/22/2018 at Unknown time  . clonazePAM (KLONOPIN) 0.5 MG tablet Take 0.25-0.5 mg by mouth 2 (two) times daily as needed.   12/21/2018 at Unknown time  . digoxin (LANOXIN) 0.125 MG tablet TAKE 1/2 TABLET BY MOUTH EVERY DAY (Patient taking differently: Take 0.0625 mg by mouth daily. ) 45 tablet 3 12/22/2018 at Unknown time  . ezetimibe (ZETIA) 10 MG tablet TAKE 1/2 TABLET TWICE A WEEK (Patient taking differently: Take 5 mg by mouth 2 (two) times a week. Saturday and Sunday) 15 tablet 3 Past Week at Unknown time  . fish oil-omega-3 fatty acids 1000 MG capsule Take 2 g by mouth daily.    Past Week at Unknown time  . fluticasone (FLONASE) 50 MCG/ACT nasal spray Place 2 sprays into both nostrils daily.   12/21/2018 at Unknown time  . isosorbide mononitrate (IMDUR) 30 MG 24 hr tablet TAKE 1 TABLET BY MOUTH EVERY DAY (Patient taking differently: Take 30 mg by mouth daily. ) 90 tablet 3 12/21/2018 at Unknown time  . loratadine (CLARITIN) 10 MG tablet Take 10 mg by mouth daily.   12/21/2018 at Unknown time  . losartan (COZAAR) 25 MG tablet TAKE 0.5 TABLETS (12.5 MG TOTAL) BY MOUTH 2 (TWO) TIMES DAILY. 60 tablet 11 12/22/2018 at Unknown time  . mexiletine (MEXITIL) 150 MG capsule Take 1 capsule (150 mg total) by mouth 2 (two) times daily. 60 capsule 11 12/22/2018 at Unknown time  . omeprazole (PRILOSEC) 40 MG capsule Take 40 mg by mouth daily.    12/22/2018 at Unknown time  . polyethylene glycol (MIRALAX / GLYCOLAX) packet Take 17 g by mouth at bedtime.   Past Week at Unknown time  . potassium chloride SA (K-DUR,KLOR-CON) 20 MEQ tablet Take 1 tablet (20 mEq total) by mouth daily. 30 tablet 2 12/21/2018 at Unknown time  . rosuvastatin (CRESTOR) 5 MG tablet Take 1 tablet by mouth on Monday,  Wednesday, and Friday (Patient taking differently: Take 5 mg by mouth 3 (three) times a week. Monday, Wednesday, and Friday) 45 tablet 3 12/21/2018 at Unknown time  . spironolactone (ALDACTONE) 25 MG tablet Take 1 tablet (25 mg total) by mouth daily. 30 tablet 0 12/22/2018 at Unknown time  .  torsemide (DEMADEX) 20 MG tablet Take 2 tablets (40 mg total) by mouth 2 (two) times daily. 120 tablet 11 12/22/2018 at Unknown time  . levofloxacin (LEVAQUIN) 250 MG tablet Take 250-500 mg by mouth See admin instructions. Take 500 mg day 1, then 250 mg daily X 6 days     . metolazone (ZAROXOLYN) 2.5 MG tablet Take 1 tablet (2.5 mg total) by mouth daily. (Patient not taking: Reported on 12/16/2018) 5 tablet 0 Not Taking at Unknown time  . nicotine (NICODERM CQ - DOSED IN MG/24 HOURS) 14 mg/24hr patch Place 1 patch (14 mg total) onto the skin daily. (Patient not taking: Reported on 12/16/2018) 30 patch 0 Not Taking at Unknown time  . nicotine polacrilex (NICORETTE) 4 MG gum Take 1 each (4 mg total) by mouth as needed for smoking cessation. 100 tablet 0 Taking  . nitroGLYCERIN (NITROSTAT) 0.4 MG SL tablet Place 1 tablet (0.4 mg total) under the tongue every 5 (five) minutes as needed for chest pain. 25 tablet 3 Unknown at Unknown time    Family History  Problem Relation Age of Onset  . Stroke Mother   . Diabetes Mellitus II Sister   . Breast cancer Sister      Review of Systems:   ROS      Cardiac Review of Systems: Y or  [    ]= no  Chest Pain [    ]  Resting SOB [   ] Exertional SOB  Blue.Reese  ]  Orthopnea [  ]   Pedal Edema [   ]    Palpitations [  ] Syncope  [  ]   Presyncope [   ]  General Review of Systems: [Y] = yes [  ]=no Constitional: recent weight change [  ]; anorexia [  ]; fatigue Blue.Reese  ]; nausea [  ]; night sweats [  ]; fever [  ]; or chills [  ]                                                               Dental: Last Dentist visit: Teeth okay  Eye : blurred vision [  ]; diplopia [   ]; vision  changes [  ];  Amaurosis fugax[  ]; Resp: cough [  ];  wheezing[  ];  hemoptysis[  ]; shortness of breath[ y ]; paroxysmal nocturnal dyspnea[  ]; dyspnea on exertion[  ]; or orthopnea[  ]; not on home oxygen GI:  gallstones[  ], vomiting[  ];  dysphagia[  ]; melena[  ];  hematochezia [  ]; heartburn[  ];   Hx of  Colonoscopy[ y ]; GU: kidney stones [  ]; hematuria[  ];   dysuria [  ];  nocturia[  ];  history of     obstruction [  ]; urinary frequency [  ]             Skin: rash, swelling[  ];, hair loss[  ];  peripheral edema[  ];  or itching[  ]; Musculosketetal: myalgias[  ];  joint swelling[  ];  joint erythema[  ];  joint pain[  ];  back pain[  ];  Heme/Lymph: bruising[  ];  bleeding[  ];  anemia[  ];  Neuro: TIA[  ];  headaches[  ];  stroke[  ];  vertigo[  ];  seizures[  ];   paresthesias[  ];  difficulty walking[  ];  Psych:depression[  ]; anxiety[  ];  Endocrine: diabetes[  ];  thyroid dysfunction[  ];                   Physical Exam: BP (!) 104/55 (BP Location: Left Arm)   Pulse 85   Temp 97.9 F (36.6 C) (Oral)   Resp 20   Ht 5\' 3"  (1.6 m)   Wt 51.9 kg   SpO2 93%   BMI 20.25 kg/m         Exam    General- alert and comfortable but frail and thin 70-year-old female    Neck- no JVD, no cervical adenopathy palpable, no carotid bruit   Lungs-breath sounds equal with scattered rhonchi   Cor- regular rate and rhythm, no murmur , + S4 gallop   Abdomen- soft, non-tender   Extremities - warm, non-tender, minimal edema   Neuro- oriented, appropriate, no focal weakness   Diagnostic Studies & Laboratory data:     Recent Radiology Findings:   No results found.   I have independently reviewed the above radiologic studies and discussed with the patient   Recent Lab Findings: Lab Results  Component Value Date   WBC 7.7 12/22/2018   HGB 11.3 (L) 12/22/2018   HCT 35.4 (L) 12/22/2018   PLT 156 12/22/2018   GLUCOSE 108 (H) 12/23/2018   CHOL 87 12/22/2018   TRIG 31  12/22/2018   HDL 39 (L) 12/22/2018   LDLDIRECT 134.7 12/13/2013   LDLCALC 42 12/22/2018   ALT 31 10/11/2018   AST 38 10/11/2018   NA 137 12/23/2018   K 3.6 12/23/2018   CL 101 12/23/2018   CREATININE 1.41 (H) 12/23/2018   BUN 32 (H) 12/23/2018   CO2 27 12/23/2018   TSH 2.586 10/11/2018   INR 1.04 12/23/2018   HGBA1C 5.5 10/11/2018      Assessment / Plan:   68 year old nondiabetic smoker with COPD, ischemic cardiomyopathy and mild-moderate RV dysfunction with moderate TR.  Currently her pulmonary status is not adequate for sternotomy and LVAD implantation but with smoking cessation and improvement in elevated pulmonary pressures and edema she certainly could become a surgical candidate.  Recommend close follow-up in the clinic.  She will need a repeat echocardiogram as her last study was over 7 months ago.  Dahlia Byes MD    @ME1 @ 12/23/2018 6:47 PM

## 2018-12-23 NOTE — Progress Notes (Addendum)
Advanced Heart Failure Rounding Note  PCP-Cardiologist: Dr Haroldine Laws.   Subjective:    Admitted after RHC with low output. Started on milrinone 0.25 mcg/kg/min. Awaiting tunneled cath in IR (confirmed that it will be today).   Creatinine improving 1.71 -> 1.41. SBP 90-110s.   Denies SOB. Feels fine. Going for PFTs today.   [place] 12/22/18: RA = 8 RV = 56/10 PA = 56/20 (35) PCW = 20 Fick cardiac output/index = 2.7/1.7 PVR = 5.6 WU PaPI =4.5 Ao sat = 96% PA sat = 47%, 48%  Objective:   Weight Range: 51.9 kg Body mass index is 20.25 kg/m.   Vital Signs:   Temp:  [97.8 F (36.6 C)-97.9 F (36.6 C)] 97.8 F (36.6 C) (01/17 0305) Pulse Rate:  [67-89] 75 (01/17 0400) Resp:  [12-53] 18 (01/17 0400) BP: (74-114)/(34-74) 102/50 (01/17 0305) SpO2:  [93 %-99 %] 93 % (01/17 0400) Weight:  [51.9 kg] 51.9 kg (01/16 1202) Last BM Date: 12/21/18  Weight change: Filed Weights   12/22/18 0620 12/22/18 1202  Weight: 51.9 kg 51.9 kg    Intake/Output:   Intake/Output Summary (Last 24 hours) at 12/23/2018 0757 Last data filed at 12/22/2018 2300 Gross per 24 hour  Intake 494.67 ml  Output 750 ml  Net -255.33 ml      Physical Exam    General:  No resp difficulty. Thin. HEENT: Normal Neck: Supple. JVP 5-6. Carotids 2+ bilat; no bruits. No lymphadenopathy or thyromegaly appreciated. Cor: PMI nondisplaced. Regular rate & rhythm. No rubs, gallops or murmurs. Lungs: Clear Abdomen: Soft, nontender, nondistended. No hepatosplenomegaly. No bruits or masses. Good bowel sounds. Extremities: No cyanosis, clubbing, rash, edema. Left forearm PIV no s/s infiltrate Neuro: Alert & orientedx3, cranial nerves grossly intact. moves all 4 extremities w/o difficulty. Affect pleasant   Telemetry   NSR 90s with occasional PVCs. Personally reviewed.   EKG    NSR with LBBB 74 bpm. Personally reviewed.   Labs    CBC Recent Labs    12/22/18 0646 12/22/18 1353  WBC 8.0 7.7    NEUTROABS  --  5.5  HGB 11.6* 11.3*  HCT 36.6 35.4*  MCV 91.7 91.0  PLT 161 947   Basic Metabolic Panel Recent Labs    12/22/18 0646 12/23/18 0126  NA 137 137  K 3.5 3.6  CL 102 101  CO2 25 27  GLUCOSE 121* 108*  BUN 36* 32*  CREATININE 1.71* 1.41*  CALCIUM 9.0 9.0  MG  --  2.3   Liver Function Tests No results for input(s): AST, ALT, ALKPHOS, BILITOT, PROT, ALBUMIN in the last 72 hours. No results for input(s): LIPASE, AMYLASE in the last 72 hours. Cardiac Enzymes No results for input(s): CKTOTAL, CKMB, CKMBINDEX, TROPONINI in the last 72 hours.  BNP: BNP (last 3 results) Recent Labs    06/18/18 0601 07/06/18 1621 09/14/18 1143  BNP 2,926.1* 1,705.1* 1,056.5*    ProBNP (last 3 results) No results for input(s): PROBNP in the last 8760 hours.   D-Dimer No results for input(s): DDIMER in the last 72 hours. Hemoglobin A1C No results for input(s): HGBA1C in the last 72 hours. Fasting Lipid Panel Recent Labs    12/22/18 1353  CHOL 87  HDL 39*  LDLCALC 42  TRIG 31  CHOLHDL 2.2   Thyroid Function Tests No results for input(s): TSH, T4TOTAL, T3FREE, THYROIDAB in the last 72 hours.  Invalid input(s): FREET3  Other results:   Imaging     No results found.  Medications:     Scheduled Medications: . aspirin EC  81 mg Oral Daily  . azelastine  1 spray Each Nare Daily  . citalopram  20 mg Oral Daily  . digoxin  0.0625 mg Oral Daily  . enoxaparin (LOVENOX) injection  30 mg Subcutaneous Q24H  . [START ON 12/24/2018] ezetimibe  5 mg Oral Once per day on Sun Sat  . fluticasone  2 spray Each Nare Daily  . isosorbide mononitrate  30 mg Oral Daily  . loratadine  10 mg Oral Daily  . losartan  12.5 mg Oral BID  . mexiletine  150 mg Oral BID  . mometasone-formoterol  2 puff Inhalation BID  . nicotine  14 mg Transdermal Q24H  . omega-3 acid ethyl esters  2 g Oral Daily  . pantoprazole  40 mg Oral Daily  . polyethylene glycol  17 g Oral QHS  .  potassium chloride SA  20 mEq Oral Daily  . rosuvastatin  5 mg Oral Once per day on Mon Wed Fri  . sodium chloride flush  3 mL Intravenous Q12H  . spironolactone  25 mg Oral Daily  . torsemide  40 mg Oral BID     Infusions: . sodium chloride    . milrinone 0.25 mcg/kg/min (12/22/18 1212)     PRN Medications:  sodium chloride, acetaminophen, albuterol, clonazePAM, ondansetron (ZOFRAN) IV, sodium chloride flush    Patient Profile   She has a hx of CAD, s/p prior Ant MI, s/p prior POBA to LAD, Dx, RCA, CHF due to ICM with high scar burden by MRI (12/09), s/p ICD, HTN, HL, depression, tobacco abuse.   Admitted after RHC with low output for initiation of milrinone.   Assessment/Plan   1.  Chronic Systolic CHF  due to [facility] with mild RV dysfunction s/p [employer] ICD.  - Echo 06/2018 EF 15%   - CPX 9/19 with severe functional limitation due predominantly to  HF. - RHC 12/22/18 showed low output with CI 1.7. [place] 4.5. RA 8 and PCWP 20. - Started on milrinone 0.25 12/22/18. Planning for tunneled line today. Confirmed with IR. [name] with [facility] is aware of her. She will have to pay $198 out of pocket only (to meet her deductible). Pt is okay with this.  - Volume looks okay on exam - Continue torsemide 40 mg BID - Continue losartan 12.5 mg BID - Continue digoxin 0.0625 mg daily - Continue spiro 25 mg daily  - Ongoing LVAD work up. She is getting PFTs today.  - Follow coox and CVP once line placed.   2. CAD s/p previous anterior MI:  Cath 8/15 nonobstructive CAD.   - Occasional CP in high-stress situations. Stable pattern. Cath 7/19 non-obstructive CAD.  - Continue ASA and statin. Continue imdur.  - No s/s ischemia   3. COPD with ongoing tobacco abuse:  - As above, discussed smoking cessation. No change. - Going for PFTs today.   4. AKI  - peak creatinine 2.1. now back to 1.65 - Improving on milrinone. 1.41 today.  Length of Stay: [place], NP  12/23/2018,  7:57 AM  Advanced Heart Failure Team Pager [phone] (M-F; 7a - 4p)  Please contact [facility] for night-coverage after hours (4p -7a ) and weekends on [email or web address]  Patient seen and examined with the above-signed Advanced Practice Provider and/or Housestaff. I personally reviewed laboratory data, imaging studies and relevant notes. I independently examined the patient and formulated the important aspects of the plan.  I have edited the note to reflect any of my changes or salient points. I have personally discussed the plan with the patient and/or family.  Milrinone started yesterday. Feeling better. Creatinine improved. Less fatigue. Will get PICC line today as well as PFTs. VAD team to see again today. Possibly home later today once PICC placed if we can get home milrinone arranged with [facility].  Glori Bickers, MD  8:39 AM

## 2018-12-23 NOTE — Progress Notes (Signed)
   Patient Status: [facility] - In-pt  Assessment and Plan: Patient in need of venous access.   Tunneled central catheter placement  ______________________________________________________________________   History of Present Illness: [name] is a 68 y.o. female   Heart failure Need home milrinone Cr 1.4 Scheduled for tunneled central catheter placement  Allergies and medications reviewed.   Review of Systems: A 12 point ROS discussed and pertinent positives are indicated in the HPI above.  All other systems are negative.   Vital Signs: BP (!) 97/49 (BP Location: Left Arm)   Pulse 85   Temp 97.8 F (36.6 C) (Oral)   Resp 16   Ht 5\' 3"  (1.6 m)   Wt 114 lb 5 oz (51.9 kg)   SpO2 97%   BMI 20.25 kg/m   Physical Exam Vitals signs reviewed.  Musculoskeletal: Normal range of motion.  Skin:    General: Skin is warm and dry.  Neurological:     General: No focal deficit present.     Mental Status: She is oriented to person, place, and time.  Psychiatric:        Behavior: Behavior normal.      Imaging reviewed.   Labs:  COAGS: Recent Labs    02/22/18 1100 06/16/18 0255 10/11/18 1223  INR 0.9 1.39 0.99  APTT  --  30  --     BMP: Recent Labs    12/16/18 1021 12/21/18 1302 12/22/18 0646 12/23/18 0126  NA 133* 136 137 137  K 4.1 3.4* 3.5 3.6  CL 99 101 102 101  CO2 24 25 25 27   GLUCOSE 94 126* 121* 108*  BUN 45* 34* 36* 32*  CALCIUM 9.4 8.8* 9.0 9.0  CREATININE 2.10* 1.65* 1.71* 1.41*  GFRNONAA 23* 31* 30* 38*  GFRAA 27* 36* 35* 44*   Scheduled for tunneled central catheter placement Pt is aware of procedure benefits and risks including but not limited to Infection; bleeding; vessel damage Agreeable to proceed' consent signed in chart    Electronically Signed: Lavonia Drafts, PA-C 12/23/2018, 10:40 AM   I spent a total of 15 minutes in face to face in clinical consultation, greater than 50% of which was counseling/coordinating care for  venous access.

## 2018-12-26 ENCOUNTER — Ambulatory Visit (INDEPENDENT_AMBULATORY_CARE_PROVIDER_SITE_OTHER): Payer: Medicare Other

## 2018-12-26 DIAGNOSIS — I5022 Chronic systolic (congestive) heart failure: Secondary | ICD-10-CM

## 2018-12-26 DIAGNOSIS — Z95810 Presence of automatic (implantable) cardiac defibrillator: Secondary | ICD-10-CM | POA: Diagnosis not present

## 2018-12-26 NOTE — Progress Notes (Signed)
EPIC Encounter for ICM Monitoring  Patient Name: [name] is a 68 y.o. female Date: 12/26/2018 Primary Care Physican: Stephens Shire, MD Primary Cardiologist: Nishan/Bensimhon Electrophysiologist: Allred Last Weight:112lbs Today's Weight: 112 lbs                                                   Heart failure questions reviewed.  Patient asymptomatic.   Thoracic impedance normal since 12/25/2018 but was abnormal from 11/21/18 through 12/24/2018.  Prescribed: Torsemide20 mg take 2 tablets(40 mg total) by mouthtwice a day.  Potassium 20 mEq take 1 tablet daily.    Labs: 12/23/2018 Creatinine 1.41, BUN 32, Potassium 3.6, Sodium 137, eGFR 38-44  12/22/2018 Creatinine 1.71, BUN 36, Potassium 3.5, Sodium 137, eGFR 30-35  12/21/2018 Creatinine 1.65, BUN 34, Potassium 3.4, Sodium 136, eGFR 31-36  12/16/2018 Creatinine 2.10, BUN 45, Potassium 4.1, Sodium 133, eGFR 23-27  11/29/2018 Creatinine 1.80, BUN 37, Potassium 3.3, Sodium 134, eGFR 28-33  11/22/2019 Creatinine 1.40, BUN 24, Potassium 3.5, Sodium 137, eGFR 38-44  11/05/2019Creatinine 1.43, BUN21, Potassium4.2, Sodium 136, [id number] 09/14/2018 Creatinine 1.31, BUN 16, Potassium 3.9, Sodium 135, eGFR 41-47 07/13/2018 Creatinine 1.18, BUN 16, Potassium 3.8, Sodium 136, eGFR 46-53  Recommendations:  No Changes.  Call if experiences any fluid symptoms.   Follow-up plan: ICM clinic phone appointment on 02/01/2019.   Office appointment scheduled 01/02/2019 with VAD clinic and 01/20/2019 with Dr Haroldine Laws.    Copy of ICM check sent to Dr. Rayann Heman and Dr Haroldine Laws.   3 month ICM trend: 12/26/2018    1 Year ICM trend:       Rosalene Billings, RN 12/26/2018 2:47 PM

## 2018-12-28 ENCOUNTER — Telehealth (HOSPITAL_COMMUNITY): Payer: Self-pay | Admitting: Cardiology

## 2018-12-28 NOTE — Telephone Encounter (Signed)
Abnormal labs received from [facility] drawn 12/27/18 K 5.5 Cr 1.11 BUN 30 Na 135   Per Rebecca Eaton Patient will need to stop potassium hold spiro x 2 days, and recheck labs 12/30/18   Pt aware and voiced understanding, repeat labs 1/24 with [facility]

## 2018-12-30 ENCOUNTER — Other Ambulatory Visit (HOSPITAL_COMMUNITY): Payer: Self-pay | Admitting: Unknown Physician Specialty

## 2018-12-30 ENCOUNTER — Other Ambulatory Visit (HOSPITAL_COMMUNITY): Payer: Self-pay | Admitting: Internal Medicine

## 2018-12-30 DIAGNOSIS — I5022 Chronic systolic (congestive) heart failure: Secondary | ICD-10-CM

## 2019-01-02 ENCOUNTER — Ambulatory Visit (HOSPITAL_COMMUNITY)
Admission: RE | Admit: 2019-01-02 | Discharge: 2019-01-02 | Disposition: A | Discharge status: Home / Self Care | Payer: Medicare Other | Source: Ambulatory Visit | Attending: Cardiology | Admitting: Cardiology

## 2019-01-02 ENCOUNTER — Encounter (HOSPITAL_COMMUNITY): Payer: Self-pay | Admitting: Cardiology

## 2019-01-02 VITALS — BP 91/53 | HR 96 | Wt 118.2 lb

## 2019-01-02 DIAGNOSIS — F17210 Nicotine dependence, cigarettes, uncomplicated: Secondary | ICD-10-CM | POA: Insufficient documentation

## 2019-01-02 DIAGNOSIS — Z79899 Other long term (current) drug therapy: Secondary | ICD-10-CM | POA: Insufficient documentation

## 2019-01-02 DIAGNOSIS — I252 Old myocardial infarction: Secondary | ICD-10-CM | POA: Diagnosis not present

## 2019-01-02 DIAGNOSIS — I083 Combined rheumatic disorders of mitral, aortic and tricuspid valves: Secondary | ICD-10-CM | POA: Diagnosis not present

## 2019-01-02 DIAGNOSIS — I272 Pulmonary hypertension, unspecified: Secondary | ICD-10-CM | POA: Insufficient documentation

## 2019-01-02 DIAGNOSIS — I2510 Atherosclerotic heart disease of native coronary artery without angina pectoris: Secondary | ICD-10-CM | POA: Diagnosis not present

## 2019-01-02 DIAGNOSIS — I130 Hypertensive heart and chronic kidney disease with heart failure and stage 1 through stage 4 chronic kidney disease, or unspecified chronic kidney disease: Secondary | ICD-10-CM | POA: Insufficient documentation

## 2019-01-02 DIAGNOSIS — N183 Chronic kidney disease, stage 3 (moderate): Secondary | ICD-10-CM | POA: Insufficient documentation

## 2019-01-02 DIAGNOSIS — I739 Peripheral vascular disease, unspecified: Secondary | ICD-10-CM | POA: Diagnosis not present

## 2019-01-02 DIAGNOSIS — F329 Major depressive disorder, single episode, unspecified: Secondary | ICD-10-CM | POA: Insufficient documentation

## 2019-01-02 DIAGNOSIS — I255 Ischemic cardiomyopathy: Secondary | ICD-10-CM | POA: Insufficient documentation

## 2019-01-02 DIAGNOSIS — J449 Chronic obstructive pulmonary disease, unspecified: Secondary | ICD-10-CM | POA: Diagnosis not present

## 2019-01-02 DIAGNOSIS — Z95810 Presence of automatic (implantable) cardiac defibrillator: Secondary | ICD-10-CM | POA: Insufficient documentation

## 2019-01-02 DIAGNOSIS — E785 Hyperlipidemia, unspecified: Secondary | ICD-10-CM | POA: Insufficient documentation

## 2019-01-02 DIAGNOSIS — I493 Ventricular premature depolarization: Secondary | ICD-10-CM | POA: Diagnosis not present

## 2019-01-02 DIAGNOSIS — I6523 Occlusion and stenosis of bilateral carotid arteries: Secondary | ICD-10-CM | POA: Diagnosis not present

## 2019-01-02 DIAGNOSIS — Z7982 Long term (current) use of aspirin: Secondary | ICD-10-CM | POA: Diagnosis not present

## 2019-01-02 DIAGNOSIS — I5022 Chronic systolic (congestive) heart failure: Secondary | ICD-10-CM | POA: Diagnosis present

## 2019-01-02 DIAGNOSIS — M19049 Primary osteoarthritis, unspecified hand: Secondary | ICD-10-CM | POA: Insufficient documentation

## 2019-01-02 LAB — CBC
HCT: 39.7 % (ref 36.0–46.0)
Hemoglobin: 12.2 g/dL (ref 12.0–15.0)
MCH: 28.4 pg (ref 26.0–34.0)
MCHC: 30.7 g/dL (ref 30.0–36.0)
MCV: 92.3 fL (ref 80.0–100.0)
NRBC: 0 % (ref 0.0–0.2)
Platelets: 204 10*3/uL (ref 150–400)
RBC: 4.3 MIL/uL (ref 3.87–5.11)
RDW: 17.1 % — ABNORMAL HIGH (ref 11.5–15.5)
WBC: 8.4 10*3/uL (ref 4.0–10.5)

## 2019-01-02 LAB — COOXEMETRY PANEL
Carboxyhemoglobin: 5.4 % (ref 0.5–1.5)
Methemoglobin: 1.2 % (ref 0.0–1.5)
O2 Saturation: 47.6 %
Total hemoglobin: 12.4 g/dL (ref 12.0–16.0)

## 2019-01-02 LAB — COMPREHENSIVE METABOLIC PANEL
ALT: 57 U/L — ABNORMAL HIGH (ref 0–44)
AST: 49 U/L — ABNORMAL HIGH (ref 15–41)
Albumin: 3.5 g/dL (ref 3.5–5.0)
Alkaline Phosphatase: 135 U/L — ABNORMAL HIGH (ref 38–126)
Anion gap: 13 (ref 5–15)
BUN: 28 mg/dL — ABNORMAL HIGH (ref 8–23)
CO2: 25 mmol/L (ref 22–32)
Calcium: 9.3 mg/dL (ref 8.9–10.3)
Chloride: 96 mmol/L — ABNORMAL LOW (ref 98–111)
Creatinine, Ser: 1.21 mg/dL — ABNORMAL HIGH (ref 0.44–1.00)
GFR calc non Af Amer: 46 mL/min — ABNORMAL LOW (ref >60)
GFR, EST AFRICAN AMERICAN: 53 mL/min — AB (ref >60)
Glucose, Bld: 73 mg/dL (ref 70–99)
Potassium: 3.9 mmol/L (ref 3.5–5.1)
SODIUM: 134 mmol/L — AB (ref 135–145)
Total Bilirubin: 1 mg/dL (ref 0.3–1.2)
Total Protein: 7.1 g/dL (ref 6.5–8.1)

## 2019-01-02 NOTE — Progress Notes (Signed)
Advanced Heart Failure Note   Patient ID: [name], female   DOB: 1951-08-18, 68 y.o.   MRN: [id number]  Date:  01/02/2019   ID:  [name], DOB [birth year], MRN [id number]  PCP:  [name] in [place] Electrophysiologist:  Dr. Thompson Grayer  General Cardiologist: Dr Johnsie Cancel HF: Dr Haroldine Laws   History of Present Illness: [name] is a 68 y.o. female who was initially referred to the HF Clinic for evaluation for advanced therapies.   She has a hx of CAD, s/p prior Ant MI, s/p prior POBA to LAD, Dx, RCA, CHF due to ICM with high scar burden by MRI (12/09), s/p ICD, HTN, HL, depression, tobacco abuse.   Admitted 2/8 - 01/17/18 with Acute/bronchitis + Influenza A. Diuresed with IV lasix with A/C CHF. Discharge weight 128 lbs. Finished course of doxycyline as outpatient.   Admitted 6/28 through 06/08/2018 with volume overload. Diuresed with IV lasix and transitioned to 80 mg lasix daily.  [name] and entresto stopped at d/c with creatinine bump. Discharge weight 128 pounds.   Admitted 7/10 through 06/28/2018. Admitted with increase dyspnea and and abdominal pain. Required short term milrinone and diuresed with IV lasix. Transitioned to torsemide 40 mg daily. GI consulted for abdominal pain. HIDDA scan was negative. Also had 20% PVCs so mexiletine was started. Discharge weight 115 pounds.   CPX 9/19 severe HF limitation with pVO2 14.2 and slope 40.   She underwent RHC on 12/22/18 notable for low output with MV sat 47% and CI 1.7. PCWP 20. She was admitted and started on milrinone. Druing that admit also underwent PFTs with further decrease in DLCO. Now on milrinone at home. Feels a bit better with milrinone. Able to do ADLs slowly but gets fatigued. Feels that milrinone has helped her to keep fluid of. No edema, orthopnea or PND. No CP. Still smoking about 5 cigs/day.  RHC 12/22/18  RA = 8 RV = 56/10 PA = 56/20 (35) PCW = 20 Fick cardiac output/index = 2.7/1.7 PVR = 5.6  WU PaPI =4.5 Ao sat = 96% PA sat = 47%, 48%   CPX 08/10/18: FVC 1.96 (69%)    FEV1 1.46 (66%)     FEV1/FVC 74 (94%)     Resting HR: 83 Peak HR: 117  (77% age predicted max HR)  Peak Vo2 14.2 (64%) slope 40 Peak RER 1.11 OUES 0.81  ECHO 06/18/2018  - Left ventricle: Limited study. The cavity size was moderately   dilated. Wall thickness was normal. The estimated ejection   fraction was 15%. Diffuse hypokinesis with regional variation.   Indeterminate diastolic function. - Aortic valve: Moderately calcified annulus. Trileaflet. - Mitral valve: Mildly calcified annulus. There was moderate   regurgitation. - Left atrium: The atrium was dilated. - Right ventricle: Pacer wire or catheter noted in right ventricle.   Systolic function was mildly reduced. - Right atrium: The atrium was dilated. - Tricuspid valve: There was moderate regurgitation. - Pulmonic valve: There was mild regurgitation. - Pulmonary arteries: PA peak pressure: 45 mm Hg (S). - Pericardium, extracardiac: There was no pericardial effusion.  ECHO 06/2018 EF 15% RV mildly dilated.  Echo 09/17/16 :LVEF 25-30%, Grade 1  ABI 7/14 R 0.93 L 0.95 MRI (12/09):  High scar burden, Inf and Apical AK, EF 24%.  Lexiscan Myoview (11/14): LV Ejection Fraction: 24%. Extensive scar in the inferior wall, apex and anterior wall. Minimal reversibility Carotid Doppler : August 2015 showed 60-79% bilateral ICA stenosis. No  previous CVA. Cath 07/26/14 Left main: Calcified 40% mid to distal stenosis LAD: Long vessel wrapping the apex 20-30% plaque in midsection. Distal vessel is small.  LCX: Large OM-1 and large OM-2. 50% lesion in proximal OM-2 RCA: Dominant vessel. Calcified, eccentric 50-60% lesion in midsection . LV-gram done in the RAO projection: Ejection fraction = 25% Severe global HK of mid to distal LV. No MR.   LHC 06/22/18: Left Main  Calcified and relatively short left main with about 40% stenosis.  Left  Anterior Descending  40% mid LAD stenosis.  Left Circumflex  50% stenosis proximal LCx just prior to stent. Patient stent proximal LCx.  Right Coronary Artery  40% mid RCA stenosis.   RHC 06/22/18: RA mean 13 RV 48/13 PA 49/16, mean 27 PCWP mean 19 LV 110/27 AO 108/57 Oxygen saturations: PA 68% AO 98% Cardiac Output (Fick) 5.04  Cardiac Index (Fick) 3.16 PVR 1.59 WU Cardiac Output (Thermo) 3.68 Cardiac Index (Thermo) 2.31  CPX 11/14 FVC 2.08 (67%)  FEV1 1.58 (66%)  FEV1/FVC 76%  Resting HR: 84 Peak HR: 142 (91% age predicted max HR) BP rest: 114/60 BP peak: 148/70 (IPE) Peak VO2: 15.0 (63% predicted peak VO2) VE/VCO2 slope: 32 OUES: 0.85 Peak RER: 1.33 Ventilatory Threshold: 12.1 (50.8% predicted peak VO2) VE/MVV: 60.1% O2pulse: 6 (75% predicted O2pulse)  LE angiography 03/02/18 showed no signficant aortoiliac disease. Left sided showed diffuse 60-70% disease affecting the proximal SFA followed by short occlusion distally with reconstitution via collaterals from the profunda and three-vessel runoff below the knee.   Labs (10/12):  K 3.2, creatinine 0.9, Hgb 13.5 Labs (9/14):    K 3.4, creatinine 1.0, pro BNP 1560, Hgb 11.8, HDL 25, LDL 60, ALT 29 Labs (10/14):  K 3.3=> 4.2, creatinine 1.2, BNP 1175 Labs (10/02/13) K 3.2 creatinine 0.9 Labs (12/13/13): LDL 134, AST 24, ALT 24, TC 221, TG 104 Labs (8/15): K 5.1, creatinine 1.1  Labs (11/15): LDL 79, HDL 67 Labs (3/16): K 3.9, creatinine 1.13 Lasbs (06/28/2018): K 4.5 Creatinine 1.38   Wt Readings from Last 3 Encounters:  01/02/19 53.6 kg (118 lb 3.2 oz)  12/22/18 51.9 kg (114 lb 5 oz)  11/21/18 53.3 kg (117 lb 9.6 oz)     Past Medical History:  Diagnosis Date  . Arthritis    hands  . Asthma   . CAD (coronary artery disease)    a. s/p prior Ant MI, b. s/p prior POBA to LAD, Dx, RCA,;  c.  LHC (10/12):  dLAD 40, pCFX 30, OM1 50, pRCA 30;  d.  Carlton Adam Myoview (11/14):  High risk study; inferior, anterior, apical  defects; minimal reversibility toward the apex; consistent with prior infarct and very mild peri-infarct ischemia, EF 24%, inferior/apical/anterior akinesis  . CHF (congestive heart failure) ([place])   . Chronic systolic heart failure ([place])   . CKD (chronic kidney disease), stage III ([place])   . Depression   . H/O hiatal hernia   . HLD (hyperlipidemia)   . HTN (hypertension)   . Ischemic cardiomyopathy    MRI (12/09):  High scar burden, Inf and Apical AK, EF 24%.;  Echo (10/14): EF 15%, Gr 2 DD, mild to mod MR, mod LAE, RVSF mildly reduced, mod RAE, severe TR, PASP 49-53 (mod pulmonary HTN)  . MI (myocardial infarction) ([place])   . Tobacco abuse     Current Outpatient Medications  Medication Sig Dispense Refill  . acetaminophen (TYLENOL ARTHRITIS PAIN) 650 MG CR tablet Take 650 mg by mouth every  8 (eight) hours as needed for pain.     Marland Kitchen albuterol (PROAIR HFA) 108 (90 Base) MCG/ACT inhaler Inhale 2 puffs into the lungs every 6 (six) hours as needed for wheezing.     Marland Kitchen aspirin 81 MG tablet Take 81 mg by mouth daily.      Marland Kitchen azelastine (ASTELIN) 0.1 % nasal spray Place 1 spray into both nostrils daily.     . budesonide-formoterol (SYMBICORT) 160-4.5 MCG/ACT inhaler Inhale 2 puffs into the lungs daily.     . citalopram (CELEXA) 20 MG tablet Take 20 mg by mouth daily.    . clonazePAM (KLONOPIN) 0.5 MG tablet Take 0.25-0.5 mg by mouth 2 (two) times daily as needed.    . digoxin (LANOXIN) 0.125 MG tablet TAKE 1/2 TABLET BY MOUTH EVERY DAY (Patient taking differently: Take 0.0625 mg by mouth daily. ) 45 tablet 3  . ezetimibe (ZETIA) 10 MG tablet TAKE 1/2 TABLET TWICE A WEEK (Patient taking differently: Take 5 mg by mouth 2 (two) times a week. Saturday and Sunday) 15 tablet 3  . feeding supplement, ENSURE ENLIVE, (ENSURE ENLIVE) LIQD Take 237 mLs by mouth 2 (two) times daily between meals. 237 mL 12  . fish oil-omega-3 fatty acids 1000 MG capsule Take 2 g by mouth daily.     . isosorbide mononitrate  (IMDUR) 30 MG 24 hr tablet TAKE 1 TABLET BY MOUTH EVERY DAY (Patient taking differently: Take 30 mg by mouth daily. ) 90 tablet 3  . loratadine (CLARITIN) 10 MG tablet Take 10 mg by mouth daily.    Marland Kitchen losartan (COZAAR) 25 MG tablet TAKE 0.5 TABLETS (12.5 MG TOTAL) BY MOUTH 2 (TWO) TIMES DAILY. 60 tablet 11  . mexiletine (MEXITIL) 150 MG capsule Take 1 capsule (150 mg total) by mouth 2 (two) times daily. 60 capsule 11  . milrinone (PRIMACOR) 20 MG/100 ML SOLN infusion Inject 0.013 mg/min into the vein continuous. 100 mL 11  . omeprazole (PRILOSEC) 40 MG capsule Take 40 mg by mouth daily.     . polyethylene glycol (MIRALAX / GLYCOLAX) packet Take 17 g by mouth at bedtime.    . rosuvastatin (CRESTOR) 5 MG tablet Take 1 tablet by mouth on Monday, Wednesday, and Friday (Patient taking differently: Take 5 mg by mouth 3 (three) times a week. Monday, Wednesday, and Friday) 45 tablet 3  . spironolactone (ALDACTONE) 25 MG tablet Take 1 tablet (25 mg total) by mouth daily. 30 tablet 0  . torsemide (DEMADEX) 20 MG tablet Take 2 tablets (40 mg total) by mouth 2 (two) times daily. 120 tablet 11  . fluticasone (FLONASE) 50 MCG/ACT nasal spray Place 2 sprays into both nostrils daily.    . nicotine (NICODERM CQ - DOSED IN MG/24 HOURS) 14 mg/24hr patch Place 1 patch (14 mg total) onto the skin daily. (Patient not taking: Reported on 12/16/2018) 30 patch 0  . nitroGLYCERIN (NITROSTAT) 0.4 MG SL tablet Place 1 tablet (0.4 mg total) under the tongue every 5 (five) minutes as needed for chest pain. (Patient not taking: Reported on 01/02/2019) 25 tablet 3   No current facility-administered medications for this encounter.    Allergies:    Allergies  Allergen Reactions  . Prednisone Shortness Of Breath, Swelling, Rash and Other (See Comments)    Sore throat, also  . Zebeta [Bisoprolol Fumarate] Other (See Comments)    Caused confusion  . Eggs Or Egg-Derived Products Other (See Comments)    Was told she is allergic to  EGG WHITES  .  Statins Other (See Comments)    Gradually tired with daily Lipitor, made pt feel badly (Pt can take Crestor 5 mg 4 times per week and Zetia 5 mg two times a week)   Social History:  The patient  reports that she has been smoking cigarettes. She has smoked for the past 40.00 years. She has never used smokeless tobacco. She reports that she does not drink alcohol or use drugs.   Review of systems complete and found to be negative unless listed in HPI.   Wt Readings from Last 3 Encounters:  01/02/19 53.6 kg (118 lb 3.2 oz)  12/22/18 51.9 kg (114 lb 5 oz)  11/21/18 53.3 kg (117 lb 9.6 oz)   Vitals:   01/02/19 1430  BP: (!) 91/53  Pulse: 96     PHYSICAL EXAM: General:  Fatigued appearing. No resp difficulty HEENT: normal Neck: supple. JVP 6  Carotids 2+ bilat; no bruits. No lymphadenopathy or thryomegaly appreciated. Cor: PMI nondisplaced. Regular rate & rhythm. No rubs, gallops or murmurs. + tunneled cath Lungs: clear with decreased BS throughout   Abdomen: soft, nontender, nondistended. No hepatosplenomegaly. No bruits or masses. Good bowel sounds. Extremities: no cyanosis, clubbing, rash, edema Neuro: alert & orientedx3, cranial nerves grossly intact. moves all 4 extremities w/o difficulty. Affect pleasant   ASSESSMENT AND PLAN:  1.  Chronic Systolic CHF  due to ICM with mild RV dysfunction s/p Medtronic ICD.  - Echo 06/2018 EF 15%   - Chronic NYHA III-IIIB symtoms - CPX 9/19 with severe functional limitation due predominantly to  HF. - Recent RHC 1/20 with low output. CI 1.7. Started on home milrinone at 0.25 - Now NYHA IIIB on home milrinone but co-ox stil--low at 47% - Volume status stable. Continue torsemide 40 mg BID. - Long talk about how she is in need of VAD but currently doesn't qualify due to worsening DLCO. We discussed need to stop smoke completely for several weeks and to repeat DLCO. If improved we would proceed with VAD. She understands that this is  a life/death issue.  - Continue digoxin 0.0625 mg daily. Check dig level today - Continue losartan 12.5 mg daily.  - Continue spironolactone 25 mg daily. - Does not tolerate beta blockers due to symptomatic brady in past. 2. CAD s/p previous anterior MI:  Cath 8/15 nonobstructive CAD.   - No s/s ischemia. Cath 7/19 non-obstructive CAD.  - Continue ASA and statin. Continue imdur.  3. COPD with ongoing tobacco abuse:  - Continues to smoke 5 cigarettes/day. She has not picked up nicotine patch or gum yet. See discussiona bove. . 4. PAD with Intermittent claudication: Medical therapy.  - Moderate bilateral calf claudication worse on the left side due to known SFA disease. This has progressed over past few months   - Had peripheral vascular catheterization 03/02/18 with no significant aortoiliac disease. Diffuse 60-70% disease affecting the left proximal and mid SFA with short occlusion distally with reconstitution via collaterals from the profunda.  Three-vessel runoff below the knee.  - Continue Risk factor management and smoking cessation. No change.  5. Bilateral carotid stenosis - Followed by Dr. Johnsie Cancel. No change.  6. H/o symptomatic bradycardia:  - Stable off BB. Do not plan to re-challenge with concerns for low output. No change.  7. Hyperlipidemia with intolerance of multiple cholesterol meds: - Tolerating Crestor 5 mg three times/week and Zetia 5mg  two times weekly.  - Per PCP. No change.  8. Depression/Anxiety:  - Per PCP.  No  change.  9. PVCs - No PVCs on exam today. - Continue mexilitene 150 mg BID. No change.  Total time spent 35 minutes. Over half that time spent discussing above.   Glori Bickers, MD  11:12 PM

## 2019-01-02 NOTE — Progress Notes (Addendum)
Patient presents for hospital follow up in [facility] today alone.  Pt states that she is able to do most housework but states that it takes her a long time to complete. She states that she does not have an appetite and admits to early satiety along with nausea. She states that she gets dizzy if she tries to do anything quickly. She denies syncope, orthopnea, PND, SOB or shocks from her defibrillator.   Vital Signs:   Automatc BP: 91/53(65) HR: 96   SPO2: 95  %  Weight: 118.2 lb w/ Milrinone pump Last weight: 112 lb Home weights: 110-113 lbs   VAD Indication: Destination Therapy-smoking  Device:Medtronic Therapies: on-200 BPM; Monitor 133BPM Last check: 12/23/18   BP & Labs: Coox: 47.6  CR: 1.21  Plan: 1. No change in medications. 2. Pt already has f/u in CHF clinic 2/14. Will need echo soon per PVT. Echo lab couldn't accommodate on the 14th.  Tanda Rockers RN [place] Coordinator   Office: [phone] 24/7 Emergency VAD Pager: [phone]

## 2019-01-03 ENCOUNTER — Other Ambulatory Visit (HOSPITAL_COMMUNITY): Payer: Self-pay | Admitting: *Deleted

## 2019-01-03 ENCOUNTER — Telehealth (HOSPITAL_COMMUNITY): Payer: Self-pay | Admitting: *Deleted

## 2019-01-03 DIAGNOSIS — I5043 Acute on chronic combined systolic (congestive) and diastolic (congestive) heart failure: Secondary | ICD-10-CM

## 2019-01-03 NOTE — Telephone Encounter (Signed)
Called pt to inform her she has echo scheduled on Friday, 01/20/19 at 2:00 pm and appt with Dr. Haroldine Laws in Heart Failure clinic at 2:40 pm same day. Pt was under impression her appt was at 1:00 pm - advised her of scheduled appts above. She is aware of new times and plans on being here for both.  Zada Girt RN, [place] Coordinator [phone]

## 2019-01-05 ENCOUNTER — Other Ambulatory Visit (HOSPITAL_COMMUNITY): Payer: Self-pay | Admitting: Internal Medicine

## 2019-01-06 ENCOUNTER — Telehealth (HOSPITAL_COMMUNITY): Payer: Self-pay | Admitting: *Deleted

## 2019-01-06 MED ORDER — TORSEMIDE 20 MG PO TABS: ORAL_TABLET | ORAL | 11 refills | Status: DC

## 2019-01-06 NOTE — Telephone Encounter (Signed)
Beth called to report pts weight is up 4lbs in 1 week also reported a dry cough. No other symptoms or complaints at this time. Per Dr.McLean take Torsemide 60mg  bid for two days then take torsemide 60mg  in the AM and 40mg  in the PM. Labs with [facility] next week. Beth is aware and agreeable with plan.

## 2019-01-20 ENCOUNTER — Other Ambulatory Visit: Payer: Self-pay

## 2019-01-20 ENCOUNTER — Ambulatory Visit (HOSPITAL_BASED_OUTPATIENT_CLINIC_OR_DEPARTMENT_OTHER)
Admission: RE | Admit: 2019-01-20 | Discharge: 2019-01-20 | Disposition: A | Discharge status: Home / Self Care | Payer: Medicare Other | Source: Ambulatory Visit | Attending: Internal Medicine | Admitting: Internal Medicine

## 2019-01-20 ENCOUNTER — Ambulatory Visit (HOSPITAL_COMMUNITY)
Admission: RE | Admit: 2019-01-20 | Discharge: 2019-01-20 | Disposition: A | Discharge status: Home / Self Care | Payer: Medicare Other | Source: Ambulatory Visit | Attending: Internal Medicine | Admitting: Internal Medicine

## 2019-01-20 VITALS — BP 98/56 | HR 97 | Wt 121.6 lb

## 2019-01-20 DIAGNOSIS — I2510 Atherosclerotic heart disease of native coronary artery without angina pectoris: Secondary | ICD-10-CM | POA: Insufficient documentation

## 2019-01-20 DIAGNOSIS — R21 Rash and other nonspecific skin eruption: Secondary | ICD-10-CM | POA: Insufficient documentation

## 2019-01-20 DIAGNOSIS — Z7982 Long term (current) use of aspirin: Secondary | ICD-10-CM | POA: Insufficient documentation

## 2019-01-20 DIAGNOSIS — F419 Anxiety disorder, unspecified: Secondary | ICD-10-CM | POA: Insufficient documentation

## 2019-01-20 DIAGNOSIS — N183 Chronic kidney disease, stage 3 (moderate): Secondary | ICD-10-CM | POA: Insufficient documentation

## 2019-01-20 DIAGNOSIS — I340 Nonrheumatic mitral (valve) insufficiency: Secondary | ICD-10-CM | POA: Diagnosis not present

## 2019-01-20 DIAGNOSIS — I252 Old myocardial infarction: Secondary | ICD-10-CM | POA: Insufficient documentation

## 2019-01-20 DIAGNOSIS — I5043 Acute on chronic combined systolic (congestive) and diastolic (congestive) heart failure: Secondary | ICD-10-CM

## 2019-01-20 DIAGNOSIS — I493 Ventricular premature depolarization: Secondary | ICD-10-CM | POA: Insufficient documentation

## 2019-01-20 DIAGNOSIS — I739 Peripheral vascular disease, unspecified: Secondary | ICD-10-CM | POA: Diagnosis not present

## 2019-01-20 DIAGNOSIS — I509 Heart failure, unspecified: Secondary | ICD-10-CM | POA: Diagnosis not present

## 2019-01-20 DIAGNOSIS — Z880 Allergy status to penicillin: Secondary | ICD-10-CM | POA: Insufficient documentation

## 2019-01-20 DIAGNOSIS — M1990 Unspecified osteoarthritis, unspecified site: Secondary | ICD-10-CM | POA: Insufficient documentation

## 2019-01-20 DIAGNOSIS — I6523 Occlusion and stenosis of bilateral carotid arteries: Secondary | ICD-10-CM | POA: Insufficient documentation

## 2019-01-20 DIAGNOSIS — Z79899 Other long term (current) drug therapy: Secondary | ICD-10-CM | POA: Diagnosis not present

## 2019-01-20 DIAGNOSIS — I255 Ischemic cardiomyopathy: Secondary | ICD-10-CM | POA: Diagnosis not present

## 2019-01-20 DIAGNOSIS — I130 Hypertensive heart and chronic kidney disease with heart failure and stage 1 through stage 4 chronic kidney disease, or unspecified chronic kidney disease: Secondary | ICD-10-CM | POA: Insufficient documentation

## 2019-01-20 DIAGNOSIS — J449 Chronic obstructive pulmonary disease, unspecified: Secondary | ICD-10-CM | POA: Insufficient documentation

## 2019-01-20 DIAGNOSIS — I272 Pulmonary hypertension, unspecified: Secondary | ICD-10-CM | POA: Insufficient documentation

## 2019-01-20 DIAGNOSIS — F17210 Nicotine dependence, cigarettes, uncomplicated: Secondary | ICD-10-CM | POA: Insufficient documentation

## 2019-01-20 DIAGNOSIS — F329 Major depressive disorder, single episode, unspecified: Secondary | ICD-10-CM | POA: Insufficient documentation

## 2019-01-20 DIAGNOSIS — Z95810 Presence of automatic (implantable) cardiac defibrillator: Secondary | ICD-10-CM | POA: Diagnosis not present

## 2019-01-20 DIAGNOSIS — I5022 Chronic systolic (congestive) heart failure: Secondary | ICD-10-CM

## 2019-01-20 DIAGNOSIS — E785 Hyperlipidemia, unspecified: Secondary | ICD-10-CM | POA: Insufficient documentation

## 2019-01-20 DIAGNOSIS — Z720 Tobacco use: Secondary | ICD-10-CM | POA: Diagnosis not present

## 2019-01-20 MED ORDER — METOLAZONE 2.5 MG PO TABS: 2.5000 mg | ORAL_TABLET | Freq: Every day | ORAL | 0 refills | Status: DC

## 2019-01-20 MED ORDER — POTASSIUM CHLORIDE CRYS ER 20 MEQ PO TBCR: 40.0000 meq | EXTENDED_RELEASE_TABLET | ORAL | 0 refills | Status: DC | PRN

## 2019-01-20 MED ORDER — METOLAZONE 2.5 MG PO TABS: 2.5000 mg | ORAL_TABLET | ORAL | 0 refills | Status: AC | PRN

## 2019-01-20 MED ORDER — POTASSIUM CHLORIDE CRYS ER 20 MEQ PO TBCR: 20.0000 meq | EXTENDED_RELEASE_TABLET | Freq: Every day | ORAL | 0 refills | Status: DC

## 2019-01-20 NOTE — Progress Notes (Signed)
Advanced Heart Failure Note   Patient ID: [name], female   DOB: 10-Mar-1951, 68 y.o.   MRN: [id number]  Date:  01/20/2019   ID:  [name], DOB [birth year], MRN [id number]  PCP:  [name] in [place] Electrophysiologist:  Dr. Thompson Grayer  General Cardiologist: Dr Johnsie Cancel HF: Dr Haroldine Laws   History of Present Illness: [name] is a 68 y.o. female who was initially referred to the HF Clinic for evaluation for advanced therapies.   She has a hx of CAD, s/p prior Ant MI, s/p prior POBA to LAD, Dx, RCA, CHF due to ICM with high scar burden by MRI (12/09), s/p ICD, HTN, HL, depression, tobacco abuse.   Admitted 2/8 - 01/17/18 with Acute/bronchitis + Influenza A. Diuresed with IV lasix with A/C CHF. Discharge weight 128 lbs. Finished course of doxycyline as outpatient.   Admitted 6/28 through 06/08/2018 with volume overload. Diuresed with IV lasix and transitioned to 80 mg lasix daily.  [name] and entresto stopped at d/c with creatinine bump. Discharge weight 128 pounds.   Admitted 7/10 through 06/28/2018. Admitted with increase dyspnea and and abdominal pain. Required short term milrinone and diuresed with IV lasix. Transitioned to torsemide 40 mg daily. GI consulted for abdominal pain. HIDDA scan was negative. Also had 20% PVCs so mexiletine was started. Discharge weight 115 pounds.   CPX 9/19 severe HF limitation with pVO2 14.2 and slope 40.   She underwent RHC on 12/22/18 notable for low output with MV sat 47% and CI 1.7. PCWP 20. She was admitted and started on milrinone. Druing that admit also underwent PFTs with further decrease in DLCO.   She returns today for HF follow up. Last visit coox was checked and was at 47% despite milrinone 0.25 mcg/kg/min. Called HF clinic 2 weeks ago with 4 lb weight gain. She was told to take torsemide 60 mg BID, then increase daily dose to 60 mg am, 40 mg pm. Overall doing okay. She stopped smoking since last visit 1/27!! She is  chewing nicotine gum. Has been more SOB over the last 2 days. No SOB with ADLs. Takes breaks in grocery store. No edema, orthopnea, or PND. No problems with tunneled line. No CP. Has rare dizziness. No presyncope. Weight 117 lbs. Taking all medications. Limits fluid and salt intake. She has an itchy rash on her chest that started after taking PCN after a root canal.   Echo today EF 15-20%, RV normal, mod MR  Labs from [facility] 01/13/19: creatinine 1.20, K 3.7, hemoglobin 11.0  Medtronic: Thoracic impedence below threshold x 2-3 weeks. No VT/VF. Active 2-3 hours/day.   RHC 12/22/18 RA = 8 RV = 56/10 PA = 56/20 (35) PCW = 20 Fick cardiac output/index = 2.7/1.7 PVR = 5.6 WU PaPI =4.5 Ao sat = 96% PA sat = 47%, 48%  CPX 08/10/18: FVC 1.96 (69%)    FEV1 1.46 (66%)     FEV1/FVC 74 (94%)     Resting HR: 83 Peak HR: 117  (77% age predicted max HR)  Peak Vo2 14.2 (64%) slope 40 Peak RER 1.11 OUES 0.81  ECHO 06/18/2018  - Left ventricle: Limited study. The cavity size was moderately   dilated. Wall thickness was normal. The estimated ejection   fraction was 15%. Diffuse hypokinesis with regional variation.   Indeterminate diastolic function. - Aortic valve: Moderately calcified annulus. Trileaflet. - Mitral valve: Mildly calcified annulus. There was moderate   regurgitation. - Left atrium: The atrium  was dilated. - Right ventricle: Pacer wire or catheter noted in right ventricle.   Systolic function was mildly reduced. - Right atrium: The atrium was dilated. - Tricuspid valve: There was moderate regurgitation. - Pulmonic valve: There was mild regurgitation. - Pulmonary arteries: PA peak pressure: 45 mm Hg (S). - Pericardium, extracardiac: There was no pericardial effusion.  ECHO 06/2018 EF 15% RV mildly dilated.  Echo 09/17/16 :LVEF 25-30%, Grade 1  ABI 7/14 R 0.93 L 0.95 MRI (12/09):  High scar burden, Inf and Apical AK, EF 24%.  Lexiscan Myoview (11/14): LV Ejection Fraction:  24%. Extensive scar in the inferior wall, apex and anterior wall. Minimal reversibility Carotid Doppler : August 2015 showed 60-79% bilateral ICA stenosis. No previous CVA. Cath 07/26/14 Left main: Calcified 40% mid to distal stenosis LAD: Long vessel wrapping the apex 20-30% plaque in midsection. Distal vessel is small.  LCX: Large OM-1 and large OM-2. 50% lesion in proximal OM-2 RCA: Dominant vessel. Calcified, eccentric 50-60% lesion in midsection . LV-gram done in the RAO projection: Ejection fraction = 25% Severe global HK of mid to distal LV. No MR.   LHC 06/22/18: Left Main  Calcified and relatively short left main with about 40% stenosis.  Left Anterior Descending  40% mid LAD stenosis.  Left Circumflex  50% stenosis proximal LCx just prior to stent. Patient stent proximal LCx.  Right Coronary Artery  40% mid RCA stenosis.   RHC 06/22/18: RA mean 13 RV 48/13 PA 49/16, mean 27 PCWP mean 19 LV 110/27 AO 108/57 Oxygen saturations: PA 68% AO 98% Cardiac Output (Fick) 5.04  Cardiac Index (Fick) 3.16 PVR 1.59 WU Cardiac Output (Thermo) 3.68 Cardiac Index (Thermo) 2.31  CPX 11/14 FVC 2.08 (67%)  FEV1 1.58 (66%)  FEV1/FVC 76%  Resting HR: 84 Peak HR: 142 (91% age predicted max HR) BP rest: 114/60 BP peak: 148/70 (IPE) Peak VO2: 15.0 (63% predicted peak VO2) VE/VCO2 slope: 32 OUES: 0.85 Peak RER: 1.33 Ventilatory Threshold: 12.1 (50.8% predicted peak VO2) VE/MVV: 60.1% O2pulse: 6 (75% predicted O2pulse)  LE angiography 03/02/18 showed no signficant aortoiliac disease. Left sided showed diffuse 60-70% disease affecting the proximal SFA followed by short occlusion distally with reconstitution via collaterals from the profunda and three-vessel runoff below the knee.   Wt Readings from Last 3 Encounters:  01/20/19 55.2 kg (121 lb 9.6 oz)  01/02/19 53.6 kg (118 lb 3.2 oz)  12/22/18 51.9 kg (114 lb 5 oz)     Past Medical History:  Diagnosis Date  . Arthritis     hands  . Asthma   . CAD (coronary artery disease)    a. s/p prior Ant MI, b. s/p prior POBA to LAD, Dx, RCA,;  c.  LHC (10/12):  dLAD 40, pCFX 30, OM1 50, pRCA 30;  d.  Carlton Adam Myoview (11/14):  High risk study; inferior, anterior, apical defects; minimal reversibility toward the apex; consistent with prior infarct and very mild peri-infarct ischemia, EF 24%, inferior/apical/anterior akinesis  . CHF (congestive heart failure) ([place])   . Chronic systolic heart failure ([place])   . CKD (chronic kidney disease), stage III ([place])   . Depression   . H/O hiatal hernia   . HLD (hyperlipidemia)   . HTN (hypertension)   . Ischemic cardiomyopathy    MRI (12/09):  High scar burden, Inf and Apical AK, EF 24%.;  Echo (10/14): EF 15%, Gr 2 DD, mild to mod MR, mod LAE, RVSF mildly reduced, mod RAE, severe TR, PASP 49-53 (mod pulmonary  HTN)  . MI (myocardial infarction) ([place])   . Tobacco abuse     Current Outpatient Medications  Medication Sig Dispense Refill  . acetaminophen (TYLENOL ARTHRITIS PAIN) 650 MG CR tablet Take 650 mg by mouth every 8 (eight) hours as needed for pain.     Marland Kitchen albuterol (PROAIR HFA) 108 (90 Base) MCG/ACT inhaler Inhale 2 puffs into the lungs every 6 (six) hours as needed for wheezing.     Marland Kitchen aspirin 81 MG tablet Take 81 mg by mouth daily.      Marland Kitchen azelastine (ASTELIN) 0.1 % nasal spray Place 1 spray into both nostrils daily.     . budesonide-formoterol (SYMBICORT) 160-4.5 MCG/ACT inhaler Inhale 2 puffs into the lungs daily.     . citalopram (CELEXA) 20 MG tablet Take 20 mg by mouth daily.    . clonazePAM (KLONOPIN) 0.5 MG tablet Take 0.25-0.5 mg by mouth 2 (two) times daily as needed.    . digoxin (LANOXIN) 0.125 MG tablet TAKE 1/2 TABLET BY MOUTH EVERY DAY (Patient taking differently: Take 0.0625 mg by mouth daily. ) 45 tablet 3  . ezetimibe (ZETIA) 10 MG tablet TAKE 1/2 TABLET TWICE A WEEK (Patient taking differently: Take 5 mg by mouth 2 (two) times a week. Saturday and Sunday) 15  tablet 3  . feeding supplement, ENSURE ENLIVE, (ENSURE ENLIVE) LIQD Take 237 mLs by mouth 2 (two) times daily between meals. 237 mL 12  . fish oil-omega-3 fatty acids 1000 MG capsule Take 2 g by mouth daily.     . fluticasone (FLONASE) 50 MCG/ACT nasal spray Place 2 sprays into both nostrils daily.    . isosorbide mononitrate (IMDUR) 30 MG 24 hr tablet TAKE 1 TABLET BY MOUTH EVERY DAY (Patient taking differently: Take 30 mg by mouth daily. ) 90 tablet 3  . loratadine (CLARITIN) 10 MG tablet Take 10 mg by mouth daily.    Marland Kitchen losartan (COZAAR) 25 MG tablet TAKE 0.5 TABLETS (12.5 MG TOTAL) BY MOUTH 2 (TWO) TIMES DAILY. 60 tablet 11  . mexiletine (MEXITIL) 150 MG capsule Take 1 capsule (150 mg total) by mouth 2 (two) times daily. 60 capsule 11  . milrinone (PRIMACOR) 20 MG/100 ML SOLN infusion Inject 0.013 mg/min into the vein continuous. 100 mL 11  . nicotine (NICODERM CQ - DOSED IN MG/24 HOURS) 14 mg/24hr patch Place 1 patch (14 mg total) onto the skin daily. 30 patch 0  . nitroGLYCERIN (NITROSTAT) 0.4 MG SL tablet Place 1 tablet (0.4 mg total) under the tongue every 5 (five) minutes as needed for chest pain. 25 tablet 3  . omeprazole (PRILOSEC) 40 MG capsule Take 40 mg by mouth daily.     . polyethylene glycol (MIRALAX / GLYCOLAX) packet Take 17 g by mouth at bedtime.    . rosuvastatin (CRESTOR) 5 MG tablet Take 1 tablet by mouth on Monday, Wednesday, and Friday (Patient taking differently: Take 5 mg by mouth 3 (three) times a week. Monday, Wednesday, and Friday) 45 tablet 3  . spironolactone (ALDACTONE) 25 MG tablet Take 1 tablet (25 mg total) by mouth daily. 30 tablet 0  . torsemide (DEMADEX) 20 MG tablet Take 60mg  in the AM and 40mg  in the PM. 150 tablet 11   No current facility-administered medications for this encounter.    Allergies:    Allergies  Allergen Reactions  . Prednisone Shortness Of Breath, Swelling, Rash and Other (See Comments)    Sore throat, also  . Zebeta [Bisoprolol  Fumarate] Other (See Comments)  Caused confusion  . Eggs Or Egg-Derived Products Other (See Comments)    Was told she is allergic to EGG WHITES  . Penicillins Rash    itching  . Statins Other (See Comments)    Gradually tired with daily Lipitor, made pt feel badly (Pt can take Crestor 5 mg 4 times per week and Zetia 5 mg two times a week)   Social History:  The patient  reports that she has been smoking cigarettes. She has smoked for the past 40.00 years. She has never used smokeless tobacco. She reports that she does not drink alcohol or use drugs.   Review of systems complete and found to be negative unless listed in HPI.   Wt Readings from Last 3 Encounters:  01/20/19 55.2 kg (121 lb 9.6 oz)  01/02/19 53.6 kg (118 lb 3.2 oz)  12/22/18 51.9 kg (114 lb 5 oz)   Vitals:   01/20/19 1500  BP: (!) 98/56  Pulse: 97  SpO2: 98%   PHYSICAL EXAM: General: Thin. No resp difficulty. HEENT: Normal Neck: Supple. JVP ~10. Carotids 2+ bilat; no bruits. No thyromegaly or nodule noted. Cor: PMI nondisplaced. RRR, No M/G/R noted. + tunneled line on right  Lungs: CTAB, normal effort. Abdomen: Soft, non-tender, non-distended, no HSM. No bruits or masses. +BS  Extremities: No cyanosis, clubbing, or rash. R and LLE no edema.  Neuro: Alert & orientedx3, cranial nerves grossly intact. moves all 4 extremities w/o difficulty. Affect pleasant   ASSESSMENT AND PLAN:  1.  Chronic Systolic CHF  due to ICM with mild RV dysfunction s/p Medtronic ICD.  - Echo 06/2018 EF 15%   - Echo today EF 15-20%, RV normal, mod MR - Chronic NYHA III-IIIB symtoms - CPX 9/19 with severe functional limitation due predominantly to  HF. - Recent RHC 1/20 with low output. CI 1.7. Started on home milrinone at 0.25 - NYHA IIIB on home milrinone 0.25.  - Volume status elevated on exam and on optivol. Continue torsemide 60 mg am, 40 mg pm. Take metolazone 2.5 mg with 40 meq today. She can take metolazone with potassium PRN.    - Continue digoxin 0.0625 mg daily. Dig level 0.6 11/29/18 - Continue losartan 12.5 mg daily.  - Continue spironolactone 25 mg daily. - Does not tolerate beta blockers due to symptomatic brady in past. - Long discussion last visit about need to stop smoking. If she stops smoking, could repeat DLCO. If improved, could move forward with VAD.  - She has stopped smoking. Repeat PFTs and DLCO in 6-8 weeks at follow up.  2. CAD s/p previous anterior MI:  Cath 8/15 nonobstructive CAD.   - No s/s ischemia. Cath 7/19 non-obstructive CAD.  - Continue ASA and statin. Continue imdur.  3. COPD with ongoing tobacco abuse:  - Stopped smoking 1/27. Using nicotine gum. Congratulated her on cessation.  4. PAD with Intermittent claudication: Medical therapy.  - Moderate bilateral calf claudication worse on the left side due to known SFA disease. This has progressed over past few months   - Had peripheral vascular catheterization 03/02/18 with no significant aortoiliac disease. Diffuse 60-70% disease affecting the left proximal and mid SFA with short occlusion distally with reconstitution via collaterals from the profunda.  Three-vessel runoff below the knee.  - Continue Risk factor management and smoking cessation. No change.  5. Bilateral carotid stenosis - Followed by Dr. Johnsie Cancel. No change. 6. H/o symptomatic bradycardia:  - Stable off BB. Do not plan to re-challenge with concerns  for low output. No change.  7. Hyperlipidemia with intolerance of multiple cholesterol meds: - Tolerating Crestor 5 mg three times/week and Zetia 5mg  two times weekly.  - Per PCP. No change.  8. Depression/Anxiety:  - Per PCP.  No change.  9. PVCs - No PVCs on exam today. - Continue mexilitene 150 mg BID. No change.  10. Rash - Itchy, red rash on neck after taking PCN. She can try benadryl.  - Advised her to contact PCP for IV steroids if not better by Monday  Repeat PFTs with DLCO in 6-8 weeks at follow up  Georgiana Shore, NP  3:30 PM  Patient seen and examined with the above-signed Advanced Practice Provider and/or Housestaff. I personally reviewed laboratory data, imaging studies and relevant notes. I independently examined the patient and formulated the important aspects of the plan. I have edited the note to reflect any of my changes or salient points. I have personally discussed the plan with the patient and/or family.  Symptoms improved on milrinone but still NYHA III-IIIB. Volume status elevated on exam and ICD interrogation No VT or AF with milrinone. She has now been off cigarettes for almost 3 weeks. I congratulated her. Will add prn metolazone for volume overload. Repeat PFTs at next visit. If DLCO improving will discuss with Dr. Prescott Gum about timing of possible VAD.   Glori Bickers, MD  8:49 PM

## 2019-01-20 NOTE — Progress Notes (Signed)
  Echocardiogram 2D Echocardiogram has been performed.  Jennette Dubin 01/20/2019, 3:07 PM

## 2019-01-20 NOTE — Patient Instructions (Addendum)
START Metolazone 2.5mg  (1 tab) as needed. When taking Metolazone also take Potassium 28meq (2 tabs). Make sure to take Potassium each time you take Metolazone.   Your physician has recommended that you have a pulmonary function test. Pulmonary Function Tests are a group of tests that measure how well air moves in and out of your lungs.  Follow up with Dr. Haroldine Laws in 6-8 weeks

## 2019-01-25 ENCOUNTER — Telehealth (HOSPITAL_COMMUNITY): Payer: Self-pay

## 2019-01-25 NOTE — Telephone Encounter (Signed)
Faxed "statement of medical necessity" for milrinone to [employer].

## 2019-01-27 ENCOUNTER — Other Ambulatory Visit (HOSPITAL_COMMUNITY): Payer: Self-pay | Admitting: Internal Medicine

## 2019-02-01 ENCOUNTER — Ambulatory Visit (INDEPENDENT_AMBULATORY_CARE_PROVIDER_SITE_OTHER): Payer: Medicare Other | Admitting: *Deleted

## 2019-02-01 DIAGNOSIS — I255 Ischemic cardiomyopathy: Secondary | ICD-10-CM

## 2019-02-02 ENCOUNTER — Other Ambulatory Visit (HOSPITAL_COMMUNITY): Payer: Self-pay | Admitting: Internal Medicine

## 2019-02-02 LAB — CUP PACEART REMOTE DEVICE CHECK
Date Time Interrogation Session: 20200226072604
HighPow Impedance: 73 Ohm
Implantable Lead Model: 6935
Implantable Pulse Generator Implant Date: 20100212
Lead Channel Impedance Value: 496 Ohm
Lead Channel Sensing Intrinsic Amplitude: 9.211 mV
Lead Channel Setting Pacing Amplitude: 2.5 V
Lead Channel Setting Pacing Pulse Width: 0.4 ms
Lead Channel Setting Sensing Sensitivity: 0.3 mV
MDC IDC LEAD IMPLANT DT: 20100212
MDC IDC LEAD LOCATION: 753860
MDC IDC MSMT BATTERY VOLTAGE: 2.64 V
MDC IDC STAT BRADY RV PERCENT PACED: 0 %

## 2019-02-03 ENCOUNTER — Telehealth (HOSPITAL_COMMUNITY): Payer: Self-pay

## 2019-02-03 ENCOUNTER — Ambulatory Visit (INDEPENDENT_AMBULATORY_CARE_PROVIDER_SITE_OTHER): Payer: Medicare Other

## 2019-02-03 ENCOUNTER — Other Ambulatory Visit (HOSPITAL_COMMUNITY): Payer: Self-pay | Admitting: Internal Medicine

## 2019-02-03 DIAGNOSIS — I5022 Chronic systolic (congestive) heart failure: Secondary | ICD-10-CM

## 2019-02-03 DIAGNOSIS — Z95810 Presence of automatic (implantable) cardiac defibrillator: Secondary | ICD-10-CM

## 2019-02-03 NOTE — Progress Notes (Signed)
EPIC Encounter for ICM Monitoring  Patient Name: [name] is a 68 y.o. female Date: 02/03/2019 Primary Care Physican: Stephens Shire, MD Primary Cardiologist: Nishan/Bensimhon Electrophysiologist: Allred Last Weight:112lbs (baseline) Today's Weight: 113 lbs  Battery: 2.64 V   Heart failure questions reviewed.  Patient has weight gain when she has fluid accumulation and shortness of breath.  She is 1 lb greater than baseline but denies any shortness of breath. She has been instructed by Dr Haroldine Laws to take Metolazone if she develops fluid symptoms and she will only take if needed. She thinks she has heard a Carletha Dawn audible tone coming from her device but she is not sure. She said if it happens again she will call.   Thoracic impedance trending just below baseline normal for past few days but has been abnormal for majority of last month  Prescribed:Torsemide20 mg take 2 tablets(40 mg total) by mouthtwice a day.  Potassium 20 mEq take 2 tablets (40 mEq total) by mouth as needed. Take when taking Metolazone.  Metolazone 2.5 mg Take 1 tablet (2.5 mg total) by mouth as needed for up to 10 days.  Labs: 01/13/2019 Creatinine 1.20, BUN 20, Potassium 3.7, Sodium 137, GFR 46-53 01/02/2019 Creatinine 1.21, BUN 28, Potassium 3.9, Sodium 134, GFR 46-53 12/30/2018 Creatinine 0.95, BUN 31, Potassium 5.1, Sodium 134, GFR 61-71 12/27/2018 Creatinine 1.11, BUN 30, Potassium 5.5, Sodium 135, GFR 51-59 12/23/2018 Creatinine 1.41, BUN 32, Potassium 3.6, Sodium 137, GFR 38-44  12/22/2018 Creatinine 1.71, BUN 36, Potassium 3.5, Sodium 137, GFR 30-35  12/21/2018 Creatinine 1.65, BUN 34, Potassium 3.4, Sodium 136, GFR 31-36  12/16/2018 Creatinine 2.10, BUN 45, Potassium 4.1, Sodium 133, GFR 23-27  11/29/2018 Creatinine 1.80, BUN 37, Potassium 3.3, Sodium 134, GFR 28-33  11/22/2019 Creatinine 1.40, BUN 24, Potassium 3.5, Sodium 137, GFR 38-44  11/05/2019Creatinine 1.43, BUN21, Potassium4.2,  Sodium 136, [id number] 09/14/2018 Creatinine 1.31, BUN 16, Potassium 3.9, Sodium 135, GFR 41-47 07/13/2018 Creatinine 1.18, BUN 16, Potassium 3.8, Sodium 136, GFR 46-53  Recommendations: She knows to take Metolazone if she has weight gain and shortness of breath. No changes today.  Reinforced limiting fluid and salt intake.     Follow-up plan: ICM clinic phone appointment on3/09/2019 to recheck fluid levels.   Office appt 03/21/2019 with Dr. Haroldine Laws.    Copy of ICM check sent to Dr. Rayann Heman and Dr Haroldine Laws for review.   3 month ICM trend: 02/03/2019    1 Year ICM trend:       Rosalene Billings, RN 02/03/2019 9:06 AM

## 2019-02-03 NOTE — Telephone Encounter (Signed)
Pt called concerned that she may need to take prior medication for her upcoming dental appointment. Pt doesn't have any valve disease hx nor is on any anticoags. Pt advised that she doesn't need to be pre medicated before dental appointment.

## 2019-02-08 NOTE — Progress Notes (Signed)
Remote ICD transmission.   

## 2019-02-10 ENCOUNTER — Other Ambulatory Visit (HOSPITAL_COMMUNITY): Payer: Self-pay | Admitting: Internal Medicine

## 2019-02-14 ENCOUNTER — Ambulatory Visit (INDEPENDENT_AMBULATORY_CARE_PROVIDER_SITE_OTHER): Payer: Medicare Other

## 2019-02-14 DIAGNOSIS — I5022 Chronic systolic (congestive) heart failure: Secondary | ICD-10-CM

## 2019-02-14 DIAGNOSIS — Z95810 Presence of automatic (implantable) cardiac defibrillator: Secondary | ICD-10-CM

## 2019-02-15 NOTE — Progress Notes (Signed)
EPIC Encounter for ICM Monitoring  Patient Name: [name] is a 68 y.o. female Date: 02/15/2019 Primary Care Physican: Stephens Shire, MD Primary Cardiologist: Nishan/Bensimhon Electrophysiologist: Allred Last Weight:112lbs (baseline) Today's Weight:115 lbs  Battery: 2.64 V   Heart failure questionsreviewed. She has gained 3 lbs in the last.  She has been instructed by Dr Haroldine Laws to take Metolazone if she develops fluid symptoms and she will only take if needed.    Thoracic impedance trending just below baselinenormal for past few days.  Prescribed:Torsemide20 mg take 3 tablets(60 mg total) by mouthevery morning and 2 tablets (40 mg total) every evening.Potassium 20 mEq take 2 tablets (40 mEq total) by mouth as needed. Take when taking Metolazone.Metolazone 2.5 mg Take 1 tablet (2.5 mg total) by mouth as needed for up to 10 days.  Labs: 01/26/2019 Creatinine 1.40, BUN 38, Potassium 4.8, Sodium 140, GFR 38-44 01/13/2019 Creatinine 1.20, BUN 20, Potassium 3.7, Sodium 137, GFR 46-53 01/02/2019 Creatinine 1.21, BUN 28, Potassium 3.9, Sodium 134, GFR 46-53 12/30/2018 Creatinine 0.95, BUN 31, Potassium 5.1, Sodium 134, GFR 61-71 12/27/2018 Creatinine 1.11, BUN 30, Potassium 5.5, Sodium 135, GFR 51-59 12/23/2018 Creatinine1.41, BUN32, Potassium3.6, [id number], [id number]  12/22/2018 Creatinine1.71, BUN36, Potassium3.5, [id number], [id number]  12/21/2018 Creatinine1.65, BUN34, Potassium3.4, Sodium136, [id number]  12/16/2018 Creatinine2.10, BUN45, Potassium4.1, Sodium133, [id number]  12/24/2019Creatinine 1.80, BUN37, Potassium3.3, Sodium134, [id number]  11/22/2019 Creatinine1.40, BUN24, Potassium3.5, [id number], [id number] 11/05/2019Creatinine 1.43, BUN21, Potassium4.2, Sodium 136, [id number] 09/14/2018 Creatinine 1.31, BUN 16, Potassium 3.9, Sodium 135, GFR 41-47 07/13/2018 Creatinine 1.18, BUN 16, Potassium 3.8, Sodium 136, GFR 46-53   Recommendations: She knows to take Metolazone if she has weight gain and shortness of breath. No changes today.  Reinforced limiting fluid and salt intake.     Follow-up plan: ICM clinic phone appointment on3/30/2020.   Office appt 03/21/2019 with Dr. Haroldine Laws.    Copy of ICM check sent to Dr. Rayann Heman and Dr Haroldine Laws for review.    3 month ICM trend: 02/13/2019    1 Year ICM trend:       Rosalene Billings, RN 02/15/2019 10:39 AM

## 2019-02-22 DIAGNOSIS — I5023 Acute on chronic systolic (congestive) heart failure: Secondary | ICD-10-CM | POA: Diagnosis not present

## 2019-02-22 DIAGNOSIS — I110 Hypertensive heart disease with heart failure: Secondary | ICD-10-CM | POA: Diagnosis not present

## 2019-03-06 ENCOUNTER — Ambulatory Visit (INDEPENDENT_AMBULATORY_CARE_PROVIDER_SITE_OTHER): Payer: Medicare Other

## 2019-03-06 ENCOUNTER — Other Ambulatory Visit: Payer: Self-pay

## 2019-03-06 DIAGNOSIS — Z95810 Presence of automatic (implantable) cardiac defibrillator: Secondary | ICD-10-CM

## 2019-03-06 DIAGNOSIS — I5022 Chronic systolic (congestive) heart failure: Secondary | ICD-10-CM | POA: Diagnosis not present

## 2019-03-06 NOTE — Progress Notes (Signed)
EPIC Encounter for ICM Monitoring  Patient Name: [name] is a 68 y.o. female Date: 03/06/2019 Primary Care Physican: Stephens Shire, MD Primary Cardiologist: Nishan/Bensimhon Electrophysiologist: Allred 02/14/2019 Weight:115lbs 03/06/2019 Weight: 116 lbs  Battery: 2.64 V  Heart failure questionsreviewed and pt asymptomatic. She said she heard the device beep last week.  She describes it as several beeps in a row and stops.  This is the 3 time beeping has occurred in the last 2 months and there is no pattern.  She is unsure what it means.  Advised will send message to device clinic to review report.   She has been instructed by Dr Haroldine Laws to take Metolazone if she develops fluid symptoms and she will only take if needed.    Thoracic impedance close to baseline normal.  Prescribed:Torsemide20 mg take 3 tablets(60 mg total) by mouthevery morning and 2 tablets (40 mg total) every evening.Potassium 20 mEqtake 2 tablets (40 mEq total) by mouth as needed. Take when taking Metolazone.Metolazone 2.5 mgTake 1 tablet (2.5 mg total) by mouth as needed for up to 10 days.  Labs: 01/26/2019 Creatinine 1.40, BUN 38, Potassium 4.8, Sodium 140, GFR 38-44 01/13/2019 Creatinine 1.20, BUN 20, Potassium 3.7, Sodium 137, GFR 46-53 01/02/2019 Creatinine 1.21, BUN 28, Potassium 3.9, Sodium 134, GFR 46-53 12/30/2018 Creatinine 0.95, BUN 31, Potassium 5.1, Sodium 134, GFR 61-71 12/27/2018 Creatinine 1.11, BUN 30, Potassium 5.5, Sodium 135, GFR 51-59 12/23/2018 Creatinine1.41, BUN32, Potassium3.6, [id number], [id number]  12/22/2018 Creatinine1.71, BUN36, Potassium3.5, [id number], [id number]  12/21/2018 Creatinine1.65, BUN34, Potassium3.4, Sodium136, [id number]  12/16/2018 Creatinine2.10, BUN45, Potassium4.1, Sodium133, [id number]  12/24/2019Creatinine 1.80, BUN37, Potassium3.3, Sodium134, [id number]  11/22/2019 Creatinine1.40, BUN24, Potassium3.5, [id number], [id number]  11/05/2019Creatinine 1.43, BUN21, Potassium4.2, Sodium 136, [id number] 09/14/2018 Creatinine 1.31, BUN 16, Potassium 3.9, Sodium 135,GFR 41-47 07/13/2018 Creatinine 1.18, BUN 16, Potassium 3.8, Sodium 136, GFR 46-53  Recommendations: She knows to take Metolazone if she has weight gain and shortness of breath. No changes today. Reinforced limiting fluid and salt intake.   Follow-up plan: ICM clinic phone appointment on4/27/2020.Office appt 03/21/2019 with Dr.Bensimhon.   Copy of ICM check sent to Dr.Allred and Dr Haroldine Laws.  3 month ICM trend: 03/06/2019    1 Year ICM trend:       Rosalene Billings, RN 03/06/2019 1:56 PM

## 2019-03-14 ENCOUNTER — Other Ambulatory Visit (HOSPITAL_COMMUNITY): Payer: Self-pay | Admitting: Internal Medicine

## 2019-03-21 ENCOUNTER — Encounter (HOSPITAL_COMMUNITY): Payer: Medicare Other

## 2019-03-21 ENCOUNTER — Encounter (HOSPITAL_COMMUNITY): Payer: Medicare Other | Admitting: Internal Medicine

## 2019-03-21 ENCOUNTER — Other Ambulatory Visit: Payer: Self-pay

## 2019-03-21 ENCOUNTER — Ambulatory Visit (HOSPITAL_COMMUNITY)
Admission: RE | Admit: 2019-03-21 | Discharge: 2019-03-21 | Disposition: A | Discharge status: Home / Self Care | Payer: Medicare Other | Source: Ambulatory Visit | Attending: Internal Medicine | Admitting: Internal Medicine

## 2019-03-21 DIAGNOSIS — N183 Chronic kidney disease, stage 3 unspecified: Secondary | ICD-10-CM

## 2019-03-21 DIAGNOSIS — I5022 Chronic systolic (congestive) heart failure: Secondary | ICD-10-CM | POA: Diagnosis not present

## 2019-03-21 DIAGNOSIS — I255 Ischemic cardiomyopathy: Secondary | ICD-10-CM

## 2019-03-21 DIAGNOSIS — I2510 Atherosclerotic heart disease of native coronary artery without angina pectoris: Secondary | ICD-10-CM

## 2019-03-21 DIAGNOSIS — Z720 Tobacco use: Secondary | ICD-10-CM

## 2019-03-21 NOTE — Addendum Note (Signed)
Encounter addended by: Valeda Malm, RN on: 03/21/2019 12:41 PM  Actions taken: Clinical Note Signed, Order list changed, Diagnosis association updated

## 2019-03-21 NOTE — Progress Notes (Addendum)
Heart Failure TeleHealth Note  Due to national recommendations of social distancing due to [place] 19, Audio/video telehealth visit is felt to be most appropriate for this patient at this time.  See MyChart message from today for patient consent regarding telehealth for [facility].  Date:  03/21/2019   ID:  [name], DOB [birth year], MRN [id number]  Location: Home  Provider location: [place] [facility] Type of Visit: Established patient   PCP:  Stephens Shire, MD  Cardiologist:  Jenkins Rouge, MD Primary HF: Dr Haroldine Laws  Chief Complaint: HF follow up   History of Present Illness: [name] is a 68 y.o. female with a history of CAD, s/p prior Ant MI, s/p prior POBA to LAD, Dx, RCA, CHF due to ICM with high scar burden by MRI (12/09), s/p ICD, HTN, HL, depression, and tobacco abuse.   Admitted 7/10 through 06/28/2018. Admitted with increase dyspnea and and abdominal pain. Required short term milrinone and diuresed with IV lasix. Transitioned to torsemide 40 mg daily. GI consulted for abdominal pain. HIDDA scan was negative. Also had 20% PVCs so mexiletine was started. Discharge weight 115 pounds.   CPX 9/19 severe HF limitation with pVO2 14.2 and slope 40.   She underwent RHC on 12/22/18 notable for low output with MV sat 47% and CI 1.7. PCWP 20. She was admitted and started on milrinone. Druing that admit also underwent PFTs with further decrease in DLCO.   Echo 01/20/19: EF 15-20%, RV normal, mod MR  Last seen in HF clinic 01/20/19. Instructed to take metolazone and potassium that day. Weight was 121 lbs on our scale, reported 117 lbs on her home scale.  She presents via Engineer, civil (consulting) for a telehealth visit today. Overall doing okay. She says she has a lot of fluid right now. Weight is up from 112 to 118 lbs. +adbominal bloating. No peripheral edema. UOP up and down with torsemide. She is SOB when fluid is up. She had PND the other night.  +orthopnea. Appetite comes and goes. Energy level is good. +cough when she goes outside. Has bad allergies. No fever, chills, or sweats. Rare dizziness, not with standing. No CP. Has not taken PRN metolazone. Itchy rash comes and goes. Her PCP has referred her to a dermatologist, but has not been able to get an appointment due to [place]. Resolves with pepcid and cortisone cream. She remains off cigarettes. Using nicotine gum. Daughter is getting her groceries and she goes to drive through for medications. [facility] following for home milrinone. No problems with PICC. SBP 100s.   Medtronic from 3/30: Thoracic impedence below threshold but trending up.  Labs from [facility] 02/24/19: creatinine 1.39, K 4.3. I believe she had more recent labs from last week with creatinine up to 1.6, not yet scanned in. Recheck ordered for this week by [facility].  she denies symptoms worrisome for COVID 19.   Past Medical History:  Diagnosis Date  . Arthritis    hands  . Asthma   . CAD (coronary artery disease)    a. s/p prior Ant MI, b. s/p prior POBA to LAD, Dx, RCA,;  c.  LHC (10/12):  dLAD 40, pCFX 30, OM1 50, pRCA 30;  d.  [facility] Myoview (11/14):  High risk study; inferior, anterior, apical defects; minimal reversibility toward the apex; consistent with prior infarct and very mild peri-infarct ischemia, EF 24%, inferior/apical/anterior akinesis  . CHF (congestive heart failure) ([place])   . Chronic systolic heart  failure ([place])   . CKD (chronic kidney disease), stage III ([place])   . Depression   . H/O hiatal hernia   . HLD (hyperlipidemia)   . HTN (hypertension)   . Ischemic cardiomyopathy    MRI (12/09):  High scar burden, Inf and Apical AK, EF 24%.;  Echo (10/14): EF 15%, Gr 2 DD, mild to mod MR, mod LAE, RVSF mildly reduced, mod RAE, severe TR, PASP 49-53 (mod pulmonary HTN)  . MI (myocardial infarction) ([place])   . Tobacco abuse    Past Surgical History:  Procedure Laterality Date  . ABDOMINAL AORTOGRAM W/LOWER EXTREMITY N/A  03/02/2018   Procedure: ABDOMINAL AORTOGRAM W/LOWER EXTREMITY;  Surgeon: Wellington Hampshire, MD;  Location: [place] CV LAB;  Service: Cardiovascular;  Laterality: N/A;  . CARDIAC DEFIBRILLATOR PLACEMENT  01/18/2009   MDT single chamber ICD implanted by Dr Rayann Heman   . cardiac stents    . COLONOSCOPY  03/18/2012   Procedure: COLONOSCOPY;  Surgeon: Beryle Beams, MD;  Location: [facility] ENDOSCOPY;  Service: Endoscopy;  Laterality: N/A;  . CORONARY ANGIOPLASTY    . ESOPHAGOGASTRODUODENOSCOPY N/A 03/10/2013   Procedure: ESOPHAGOGASTRODUODENOSCOPY (EGD);  Surgeon: Beryle Beams, MD;  Location: [facility];  Service: Endoscopy;  Laterality: N/A;  . IR FLUORO GUIDE CV LINE RIGHT  12/23/2018  . IR US GUIDE VASC ACCESS RIGHT  12/23/2018  . LEFT HEART CATHETERIZATION WITH CORONARY ANGIOGRAM N/A 07/26/2014   Procedure: LEFT HEART CATHETERIZATION WITH CORONARY ANGIOGRAM;  Surgeon: Jolaine Artist, MD;  Location: [facility] CATH LAB;  Service: Cardiovascular;  Laterality: N/A;  . RIGHT HEART CATH N/A 12/22/2018   Procedure: RIGHT HEART CATH;  Surgeon: Jolaine Artist, MD;  Location: [place] CV LAB;  Service: Cardiovascular;  Laterality: N/A;  . RIGHT/LEFT HEART CATH AND CORONARY ANGIOGRAPHY N/A 06/22/2018   Procedure: RIGHT/LEFT HEART CATH AND CORONARY ANGIOGRAPHY;  Surgeon: Larey Dresser, MD;  Location: [place] CV LAB;  Service: Cardiovascular;  Laterality: N/A;     Current Outpatient Medications  Medication Sig Dispense Refill  . aspirin 81 MG tablet Take 81 mg by mouth daily.      Marland Kitchen azelastine (ASTELIN) 0.1 % nasal spray Place 1 spray into both nostrils daily.     . citalopram (CELEXA) 20 MG tablet Take 20 mg by mouth daily.    . clonazePAM (KLONOPIN) 0.5 MG tablet Take 0.25-0.5 mg by mouth 2 (two) times daily as needed.    . digoxin (LANOXIN) 0.125 MG tablet TAKE 1/2 TABLET BY MOUTH EVERY DAY (Patient taking differently: Take 0.0625 mg by mouth daily. ) 45 tablet 3  . ezetimibe (ZETIA) 10 MG tablet  TAKE 1/2 TABLET TWICE A WEEK (Patient taking differently: Take 5 mg by mouth 2 (two) times a week. Saturday and Sunday) 15 tablet 3  . fish oil-omega-3 fatty acids 1000 MG capsule Take 2 g by mouth daily.     . fluticasone (FLONASE) 50 MCG/ACT nasal spray Place 2 sprays into both nostrils daily.    . isosorbide mononitrate (IMDUR) 30 MG 24 hr tablet TAKE 1 TABLET BY MOUTH EVERY DAY (Patient taking differently: Take 30 mg by mouth daily. ) 90 tablet 3  . loratadine (CLARITIN) 10 MG tablet Take 10 mg by mouth daily.    Marland Kitchen losartan (COZAAR) 25 MG tablet TAKE 0.5 TABLETS (12.5 MG TOTAL) BY MOUTH 2 (TWO) TIMES DAILY. 60 tablet 11  . mexiletine (MEXITIL) 150 MG capsule Take 1 capsule (150 mg total) by mouth 2 (two) times daily.  60 capsule 11  . milrinone (PRIMACOR) 20 MG/100 ML SOLN infusion Inject 0.013 mg/min into the vein continuous. 100 mL 11  . omeprazole (PRILOSEC) 40 MG capsule Take 40 mg by mouth daily.     . rosuvastatin (CRESTOR) 5 MG tablet Take 1 tablet by mouth on Monday, Wednesday, and Friday (Patient taking differently: Take 5 mg by mouth 3 (three) times a week. Monday, Wednesday, and Friday) 45 tablet 3  . spironolactone (ALDACTONE) 25 MG tablet TAKE 1 TABLET BY MOUTH EVERY DAY 30 tablet 3  . torsemide (DEMADEX) 20 MG tablet Take 60mg  in the AM and 40mg  in the PM. 150 tablet 11  . acetaminophen (TYLENOL ARTHRITIS PAIN) 650 MG CR tablet Take 650 mg by mouth every 8 (eight) hours as needed for pain.     Marland Kitchen albuterol (PROAIR HFA) 108 (90 Base) MCG/ACT inhaler Inhale 2 puffs into the lungs every 6 (six) hours as needed for wheezing.     . budesonide-formoterol (SYMBICORT) 160-4.5 MCG/ACT inhaler Inhale 2 puffs into the lungs daily.     . feeding supplement, ENSURE ENLIVE, (ENSURE ENLIVE) LIQD Take 237 mLs by mouth 2 (two) times daily between meals. 237 mL 12  . metolazone (ZAROXOLYN) 2.5 MG tablet Take 1 tablet (2.5 mg total) by mouth as needed for up to 10 days. 10 tablet 0  . nicotine  (NICODERM CQ - DOSED IN MG/24 HOURS) 14 mg/24hr patch Place 1 patch (14 mg total) onto the skin daily. 30 patch 0  . nitroGLYCERIN (NITROSTAT) 0.4 MG SL tablet Place 1 tablet (0.4 mg total) under the tongue every 5 (five) minutes as needed for chest pain. 25 tablet 3  . polyethylene glycol (MIRALAX / GLYCOLAX) packet Take 17 g by mouth at bedtime.    . potassium chloride SA (K-DUR,KLOR-CON) 20 MEQ tablet Take 2 tablets (40 mEq total) by mouth as needed. Take when taking Metolazone 20 tablet 0   No current facility-administered medications for this encounter.     Allergies:   Prednisone; Zebeta [bisoprolol fumarate]; Eggs or egg-derived products; Penicillins; and Statins   Social History:  The patient  reports that she has been smoking cigarettes. She has smoked for the past 40.00 years. She has never used smokeless tobacco. She reports that she does not drink alcohol or use drugs.   Family History:  The patient's family history includes Breast cancer in her sister; Diabetes Mellitus II in her sister; Stroke in her mother.   ROS:  Please see the history of present illness.   All other systems are personally reviewed and negative.   Exam:  (Video/Tele Health Call; Exam is subjective and or/visual.) General:  Speaks in full sentences. No resp difficulty. Lungs: Normal respiratory effort with conversation.  Abdomen: +distended per patient report Extremities: Pt denies edema. Neuro: Alert & oriented x 3.   Recent Labs: 09/14/2018: B Natriuretic Peptide 1,056.5 10/11/2018: TSH 2.586 12/23/2018: Magnesium 2.3 01/02/2019: ALT 57; BUN 28; Creatinine, Ser 1.21; Hemoglobin 12.2; Platelets 204; Potassium 3.9; Sodium 134  Personally reviewed   Wt Readings from Last 3 Encounters:  01/20/19 55.2 kg (121 lb 9.6 oz)  01/02/19 53.6 kg (118 lb 3.2 oz)  12/22/18 51.9 kg (114 lb 5 oz)      ASSESSMENT AND PLAN:  1.  Chronic Systolic CHF  due to ICM with mild RV dysfunction s/p Medtronic ICD.  - Echo  06/2018 EF 15%   - Echo 01/20/19 EF 15-20%, RV normal, mod MR - Chronic NYHA III-IIIB symtoms -  CPX 9/19 with severe functional limitation due predominantly to  HF. - Recent RHC 1/20 with low output. CI 1.7. Started on home milrinone at 0.25 - NYHA IIIB on home milrinone 0.25.  - Volume status elevated. Continue torsemide 60 mg am, 40 mg pm. She can take metolazone with potassium PRN. Will have her take again today. Will have a BMET this week and again next week.  - Continue digoxin 0.0625 mg daily. Dig level 0.6 11/29/18. Recheck dig level with next [facility] labs.  - Continue losartan 12.5 mg BID.  - Continue spironolactone 25 mg daily. - Does not tolerate beta blockers due to symptomatic brady in past. - She has stopped smoking. Repeat PFTs and DLCO when able (delayed due to [place]). If improved, could move forward with VAD. 2. CAD s/p previous anterior MI:  Cath 8/15 nonobstructive CAD.   - No s/s ischemia. Cath 7/19 non-obstructive CAD.  - Continue ASA and statin. Continue imdur.  3. COPD with ongoing tobacco abuse:  - Stopped smoking 1/27. Using nicotine gum. Still not smoking, but wants to. Congratulated her on cessation and encouraged her to keep it up.  4. PAD with Intermittent claudication: Medical therapy.  - Moderate bilateral calf claudication worse on the left side due to known SFA disease. This has progressed over past few months   - Had peripheral vascular catheterization 03/02/18 with no significant aortoiliac disease. Diffuse 60-70% disease affecting the left proximal and mid SFA with short occlusion distally with reconstitution via collaterals from the profunda. Three-vessel runoff below the knee.  - Continue Risk factor management and smoking cessation. No change.   5. Bilateral carotid stenosis - Followed by Dr. Johnsie Cancel. No change.  6. H/o symptomatic bradycardia:  - Stable off BB with low output. 7. Hyperlipidemia with intolerance of multiple cholesterol meds: - Tolerating  Crestor 5 mg three times/week and Zetia 5mg  two times weekly.  8. PVCs - Continue mexilitene 150 mg BID. Denies palpitations.  9. Rash - Comes and goes. Has happened twice now. Felt like a sunburn and peeled. PCP has referred her to a dermatologist, but unable to see anyone until June - No rash right now.    COVID screen The patient does not have any symptoms that suggest any further testing/ screening at this time.  Social distancing reinforced today. Daughter getting foods now. Wearing mask with any interactions.   Relevant cardiac medications were reviewed at length with the patient today.  The patient does not have concerns regarding their medications at this time. No refills needed  The following changes were made today: Take metolazone 2.5 mg with 40 meq K today  Recommended follow-up: 1 week by telephone. I asked her to send manual ICD transmission the day before her appointment. She is scheduled for BMET this week. Would like to also check BMET and dig level next week through [facility]  Today, I have spent 23 minutes with the patient with telehealth technology discussing the above issues.    Signed, Georgiana Shore, NP  03/21/2019 12:02 PM  [place] [address] [facility] and [place] [address] [phone] (office) [phone] (fax)

## 2019-03-21 NOTE — Progress Notes (Signed)
Called patient, discussed AVS.  Pt verbalized understanding.  AVS mailed

## 2019-03-21 NOTE — Addendum Note (Signed)
Encounter addended by: Georgiana Shore, NP on: 03/21/2019 12:43 PM  Actions taken: Clinical Note Signed

## 2019-03-21 NOTE — Addendum Note (Signed)
Encounter addended by: Valeda Malm, RN on: 03/21/2019 12:44 PM  Actions taken: Clinical Note Signed

## 2019-03-21 NOTE — Patient Instructions (Addendum)
1. Take metolazone 2.5 mg with 40 meq K today   2. Follow up 1 week by telephone with Nurse practitioner.   3. Labs next week through [place] and Digoxin level We will only contact you if something comes back abnormal or we need to make some changes. Otherwise no news is good news!  4. You have been referred for a pulmonary function test. They will contact you as soon as they are able to given the current events with Covid -19.  Your physician has recommended that you have a pulmonary function test. Pulmonary Function Tests are a group of tests that measure how well air moves in and out of your lungs.

## 2019-03-27 ENCOUNTER — Telehealth (HOSPITAL_COMMUNITY): Payer: Medicare Other

## 2019-03-28 ENCOUNTER — Other Ambulatory Visit (HOSPITAL_COMMUNITY): Payer: Self-pay | Admitting: Internal Medicine

## 2019-03-28 NOTE — Progress Notes (Addendum)
Heart Failure TeleHealth Note  Due to national recommendations of social distancing due to [place] 19, telehealth visit is felt to be most appropriate for this patient at this time.  I discussed the limitations, risks, security and privacy concerns of performing an evaluation and management service by telephone and the availability of in person appointments. I also discussed with the patient that there may be a patient responsible charge related to this service. The patient expressed understanding and agreed to proceed.   ID:  [name], DOB 19-Mar-1951, MRN [id number]  Location: Home  Provider location: [address] [place] Type of Visit: Established patient   PCP:  Stephens Shire, MD  Cardiologist:  Jenkins Rouge, MD Primary HF: Dr Haroldine Laws  Chief Complaint: HF follow up   History of Present Illness: [name] is a 68 y.o. female with a history of CAD, s/p prior Ant MI, s/p prior POBA to LAD, Dx, RCA, CHF due to ICM with high scar burden by MRI (12/09), s/p ICD, HTN, HL, depression, and tobacco abuse.   Admitted 7/10 through 06/28/2018. Admitted with increase dyspnea and and abdominal pain. Required short term milrinone and diuresed with IV lasix. Transitioned to torsemide 40 mg daily. GI consulted for abdominal pain. HIDDA scan was negative. Also had 20% PVCs so mexiletine was started. Discharge weight 115 pounds.   CPX 9/19 severe HF limitation with pVO2 14.2 and slope 40.   She underwent RHC on 12/22/18 notable for low output with MV sat 47% and CI 1.7. PCWP 20. She was admitted and started on milrinone. Druing that admit also underwent PFTs with further decrease in DLCO.  Echo 01/20/19: EF 15-20%, RV normal, mod MR  She presents via audio conferencing for a telehealth visit today.  Last week she was instructed to take metolazone due to 6 lb weight gain. Metolazone initially helped and weight got down to 114 lbs, but now back up to 117 lbs (dry weight is 112  lbs). Urinated a lot with metolazone, not as much with torsemide. Weight slowly trended back up. She has been SOB throughout the week and doesn't feel great. Says she feels "blah". +abdominal bloating. No peripheral edema. She has chronic 2 pillow orthopnea. No further PND. She has occasional lightheadedness, sometimes with standing. No falls. No CP. Had a weird sensation overnight where heart felt like it was "turning over". Appetite okay for the most part. Energy level unchanged. No problems with PICC line. No fever/chills/sweats. Weight 118 -> 114 -> 117 lbs today. SBP 90-100s. Still not smoking. Rash is back on upper chest, on left and right side.    Pt denies symptoms of cough, fevers, chills, or new SOB worrisome for COVID 19.    Past Medical History:  Diagnosis Date  . Arthritis    hands  . Asthma   . CAD (coronary artery disease)    a. s/p prior Ant MI, b. s/p prior POBA to LAD, Dx, RCA,;  c.  LHC (10/12):  dLAD 40, pCFX 30, OM1 50, pRCA 30;  d.  [facility] Myoview (11/14):  High risk study; inferior, anterior, apical defects; minimal reversibility toward the apex; consistent with prior infarct and very mild peri-infarct ischemia, EF 24%, inferior/apical/anterior akinesis  . CHF (congestive heart failure) ([place])   . Chronic systolic heart failure ([place])   . CKD (chronic kidney disease), stage III ([place])   . Depression   . H/O hiatal hernia   . HLD (hyperlipidemia)   .  HTN (hypertension)   . Ischemic cardiomyopathy    MRI (12/09):  High scar burden, Inf and Apical AK, EF 24%.;  Echo (10/14): EF 15%, Gr 2 DD, mild to mod MR, mod LAE, RVSF mildly reduced, mod RAE, severe TR, PASP 49-53 (mod pulmonary HTN)  . MI (myocardial infarction) ([place])   . Tobacco abuse    Past Surgical History:  Procedure Laterality Date  . ABDOMINAL AORTOGRAM W/LOWER EXTREMITY N/A 03/02/2018   Procedure: ABDOMINAL AORTOGRAM W/LOWER EXTREMITY;  Surgeon: Wellington Hampshire, MD;  Location: [facility];  Service:  Cardiovascular;  Laterality: N/A;  . CARDIAC DEFIBRILLATOR PLACEMENT  01/18/2009   MDT single chamber ICD implanted by Dr Rayann Heman   . cardiac stents    . COLONOSCOPY  03/18/2012   Procedure: COLONOSCOPY;  Surgeon: Beryle Beams, MD;  Location: [facility];  Service: Endoscopy;  Laterality: N/A;  . CORONARY ANGIOPLASTY    . ESOPHAGOGASTRODUODENOSCOPY N/A 03/10/2013   Procedure: ESOPHAGOGASTRODUODENOSCOPY (EGD);  Surgeon: Beryle Beams, MD;  Location: [facility];  Service: Endoscopy;  Laterality: N/A;  . IR FLUORO GUIDE CV LINE RIGHT  12/23/2018  . IR US GUIDE VASC ACCESS RIGHT  12/23/2018  . LEFT HEART CATHETERIZATION WITH CORONARY ANGIOGRAM N/A 07/26/2014   Procedure: LEFT HEART CATHETERIZATION WITH CORONARY ANGIOGRAM;  Surgeon: Jolaine Artist, MD;  Location: [facility];  Service: Cardiovascular;  Laterality: N/A;  . RIGHT HEART CATH N/A 12/22/2018   Procedure: RIGHT HEART CATH;  Surgeon: Jolaine Artist, MD;  Location: [facility];  Service: Cardiovascular;  Laterality: N/A;  . RIGHT/LEFT HEART CATH AND CORONARY ANGIOGRAPHY N/A 06/22/2018   Procedure: RIGHT/LEFT HEART CATH AND CORONARY ANGIOGRAPHY;  Surgeon: Larey Dresser, MD;  Location: [facility];  Service: Cardiovascular;  Laterality: N/A;     Current Outpatient Medications  Medication Sig Dispense Refill  . acetaminophen (TYLENOL ARTHRITIS PAIN) 650 MG CR tablet Take 650 mg by mouth every 8 (eight) hours as needed for pain.     Marland Kitchen aspirin 81 MG tablet Take 81 mg by mouth daily.      . digoxin (LANOXIN) 0.125 MG tablet TAKE 1/2 TABLET BY MOUTH EVERY DAY (Patient taking differently: Take 0.0625 mg by mouth daily. ) 45 tablet 3  . ezetimibe (ZETIA) 10 MG tablet TAKE 1/2 TABLET TWICE A WEEK (Patient taking differently: Take 5 mg by mouth 2 (two) times a week. Saturday and Sunday) 15 tablet 3  . isosorbide mononitrate (IMDUR) 30 MG 24 hr tablet TAKE 1 TABLET BY MOUTH EVERY DAY (Patient taking differently: Take 30 mg by  mouth daily. ) 90 tablet 3  . loratadine (CLARITIN) 10 MG tablet Take 10 mg by mouth daily.    Marland Kitchen losartan (COZAAR) 25 MG tablet TAKE 0.5 TABLETS (12.5 MG TOTAL) BY MOUTH 2 (TWO) TIMES DAILY. 60 tablet 11  . mexiletine (MEXITIL) 150 MG capsule Take 1 capsule (150 mg total) by mouth 2 (two) times daily. 60 capsule 11  . milrinone (PRIMACOR) 20 MG/100 ML SOLN infusion Inject 0.013 mg/min into the vein continuous. 100 mL 11  . rosuvastatin (CRESTOR) 5 MG tablet Take 1 tablet by mouth on Monday, Wednesday, and Friday (Patient taking differently: Take 5 mg by mouth 3 (three) times a week. Monday, Wednesday, and Friday) 45 tablet 3  . spironolactone (ALDACTONE) 25 MG tablet TAKE 1 TABLET BY MOUTH EVERY DAY 30 tablet 3  . torsemide (DEMADEX) 20 MG tablet Take 60mg  in the AM and 40mg  in the  PM. 150 tablet 11  . albuterol (PROAIR HFA) 108 (90 Base) MCG/ACT inhaler Inhale 2 puffs into the lungs every 6 (six) hours as needed for wheezing.     Marland Kitchen azelastine (ASTELIN) 0.1 % nasal spray Place 1 spray into both nostrils daily.     . budesonide-formoterol (SYMBICORT) 160-4.5 MCG/ACT inhaler Inhale 2 puffs into the lungs daily.     . citalopram (CELEXA) 20 MG tablet Take 20 mg by mouth daily.    . clonazePAM (KLONOPIN) 0.5 MG tablet Take 0.25-0.5 mg by mouth 2 (two) times daily as needed.    . feeding supplement, ENSURE ENLIVE, (ENSURE ENLIVE) LIQD Take 237 mLs by mouth 2 (two) times daily between meals. 237 mL 12  . fish oil-omega-3 fatty acids 1000 MG capsule Take 2 g by mouth daily.     . fluticasone (FLONASE) 50 MCG/ACT nasal spray Place 2 sprays into both nostrils daily.    . metolazone (ZAROXOLYN) 2.5 MG tablet Take 1 tablet (2.5 mg total) by mouth as needed for up to 10 days. 10 tablet 0  . nicotine (NICODERM CQ - DOSED IN MG/24 HOURS) 14 mg/24hr patch Place 1 patch (14 mg total) onto the skin daily. 30 patch 0  . nitroGLYCERIN (NITROSTAT) 0.4 MG SL tablet Place 1 tablet (0.4 mg total) under the tongue every 5  (five) minutes as needed for chest pain. 25 tablet 3  . omeprazole (PRILOSEC) 40 MG capsule Take 40 mg by mouth daily.     . polyethylene glycol (MIRALAX / GLYCOLAX) packet Take 17 g by mouth at bedtime.    . potassium chloride SA (K-DUR,KLOR-CON) 20 MEQ tablet Take 2 tablets (40 mEq total) by mouth as needed. Take when taking Metolazone 20 tablet 0   No current facility-administered medications for this encounter.     Allergies:   Prednisone; Zebeta [bisoprolol fumarate]; Eggs or egg-derived products; Penicillins; and Statins   Social History:  The patient  reports that she has been smoking cigarettes. She has smoked for the past 40.00 years. She has never used smokeless tobacco. She reports that she does not drink alcohol or use drugs.   Family History:  The patient's family history includes Breast cancer in her sister; Diabetes Mellitus II in her sister; Stroke in her mother.   ROS:  Please see the history of present illness.   All other systems are personally reviewed and negative.   Exam:  [facility] Call) Lungs: Normal respiratory effort with conversation.  Neuro: Alert & oriented x 3.  Denies edema.  + abdominal distension  Red rash to upper chest and neck.   Recent Labs: 09/14/2018: B Natriuretic Peptide 1,056.5 10/11/2018: TSH 2.586 12/23/2018: Magnesium 2.3 01/02/2019: ALT 57; BUN 28; Creatinine, Ser 1.21; Hemoglobin 12.2; Platelets 204; Potassium 3.9; Sodium 134  Personally reviewed   Wt Readings from Last 3 Encounters:  01/20/19 55.2 kg (121 lb 9.6 oz)  01/02/19 53.6 kg (118 lb 3.2 oz)  12/22/18 51.9 kg (114 lb 5 oz)      ASSESSMENT AND PLAN:  1. Chronic Systolic CHF due to ICM with mild RV dysfunction s/p [employer] ICD.  - Echo 06/2018 EF 15% - Echo 01/20/19 EF 15-20%, RV normal, mod MR - Chronic NYHA III-IIIB symtoms - CPX 9/19 with severe functional limitation due predominantly to HF. - Recent RHC 1/20 with low output. CI 1.7. Started on home milrinone at  0.25 - NYHA IIIB on home milrinone0.25. - Volume statussounds elevated. -Increase torsemide to 80 mg am, 60  mg pm. Can take metolazone 2.5 mg x1 PRN with potassium.  - Continue digoxin 0.0625 mg daily.Dig level 0.6 11/29/18. Recheck dig level with next [facility] labs.  - Continue losartan 12.5 mg BID.  - Continue spironolactone 25 mg daily. - Does not tolerate beta blockers due to symptomatic brady in past. - She has stopped smoking. Repeat PFTs and DLCO when able (delayed due to [place]).If improved, could move forward with VAD. 2. CAD s/p previous anterior MI: Cath 8/15 nonobstructive CAD.  -No s/s ischemia.Cath 7/19 non-obstructive CAD.  - Continue ASA and statin. Continue imdur.  3. COPD with ongoing tobacco abuse:  -Stopped smoking 1/27. Using nicotine gum.Stil Congratulated her on cessation and encouraged her to keep it up. 4. PAD with Intermittent claudication: Medical therapy.  - Moderate bilateral calf claudication worse on the left side due to known SFA disease. This has progressed over past few months  - Had peripheral vascular catheterization 03/02/18 with no significant aortoiliac disease. Diffuse 60-70% disease affecting the left proximal and mid SFA with short occlusion distally with reconstitution via collaterals from the profunda. Three-vessel runoff below the knee.  - Continue Risk factor management and smoking cessation.No change.  5. Bilateral carotid stenosis - Followed by Dr. Johnsie Cancel.No change 6. H/o symptomatic bradycardia:  - Stable off BB with low output. No change 7. Hyperlipidemia with intolerance of multiple cholesterol meds: - Tolerating Crestor 5 mg three times/week and Zetia 5mg  two times weekly. No change.  8. PVCs - Continue mexilitene 150 mg BID.Denies palpitations. No change.  9. Rash - Comes and goes. Itchy. Felt like a sunburn and peeled previous times. Originally thought to be related to PCN in February.  - Has recurrent rash again (3rd time)  on upper chest and neck, sometimes up to face. Sometimes feels like throat is closing. No swelling around lips. Her primary care office is not seeing anybody and she has been referred to dermatology and cannot see her until June. No new medications, lotions, or detergent. - Concerning that this contribute to tunneled PICC line infection. We are also working her up for potential LVAD and if she has severe allergies, may be prohibitive since she would have chronic dressing to driveline. - I called [facility] and they said they could probably work her in. Requested to fax records and mark as urgent with explanation. I will try to get a photo from video chat into epic of her rash to also send over.  COVID screen The patient does not have any symptoms that suggest any further testing/ screening at this time.  Social distancing reinforced today.  Patient Risk: After full review of this patients clinical status, I feel that they are at moderate risk for cardiac decompensation at this time.  Orders/Follow up: Increase torsemide 80 mg am, 60 mg pm. BMET, dig level this week with [facility]. Refer to [facility]. Follow up 1 week.    Signed, Georgiana Shore, NP  03/29/2019 10:23 AM  Today, I have spent 25 minutes with the patient with telehealth technology discussing the above.    [facility] [address] [address] [address] [phone] (office) [phone] (fax)

## 2019-03-29 ENCOUNTER — Other Ambulatory Visit: Payer: Self-pay

## 2019-03-29 ENCOUNTER — Telehealth (HOSPITAL_COMMUNITY): Payer: Self-pay

## 2019-03-29 ENCOUNTER — Ambulatory Visit (HOSPITAL_COMMUNITY)
Admission: RE | Admit: 2019-03-29 | Discharge: 2019-03-29 | Disposition: A | Discharge status: Home / Self Care | Payer: Medicare Other | Source: Ambulatory Visit | Attending: Adult Health | Admitting: Adult Health

## 2019-03-29 ENCOUNTER — Other Ambulatory Visit (HOSPITAL_COMMUNITY): Payer: Self-pay | Admitting: Internal Medicine

## 2019-03-29 DIAGNOSIS — I5022 Chronic systolic (congestive) heart failure: Secondary | ICD-10-CM

## 2019-03-29 DIAGNOSIS — I2510 Atherosclerotic heart disease of native coronary artery without angina pectoris: Secondary | ICD-10-CM

## 2019-03-29 DIAGNOSIS — N183 Chronic kidney disease, stage 3 unspecified: Secondary | ICD-10-CM

## 2019-03-29 DIAGNOSIS — I255 Ischemic cardiomyopathy: Secondary | ICD-10-CM | POA: Diagnosis not present

## 2019-03-29 DIAGNOSIS — R21 Rash and other nonspecific skin eruption: Secondary | ICD-10-CM

## 2019-03-29 MED ORDER — TORSEMIDE 20 MG PO TABS: ORAL_TABLET | ORAL | 0 refills | Status: DC

## 2019-03-29 NOTE — Telephone Encounter (Signed)
Reviewed AVS with pt and understands she needs pictures taken and Derm will call her along with Caryl Pina this afternoon  1. Urgent referral to [facility] for rash on upper chest. I spoke to them and they said they could work her in. They asked to fax our notes to [phone] and mark as urgent with reasons for urgency: due to concern that it could contribute to tunneled PICC line infection, also working her up for potential LVAD and if she has severe allergies, may be prohibitive since she would have chronic dressing to driveline, also feels like throat is tight at times. Please send my note from today and Bensimhon's note from 2/14. I am going to do a video chat with her later on today and try to get a screen shot to send over as well. /referral placed, have office staff faxing notes  2. Increase torsemide to 80 mg am, 60 mg pm. She has BMET scheduled with [facility] already this week. /completed  3. Follow up 8 days./ 30 April @10 :30

## 2019-03-29 NOTE — Patient Instructions (Signed)
1. Urgent referral to [facility] for rash on upper chest. I spoke to them and they said they could work her in. They asked to fax our notes to [phone] and mark as urgent with reasons for urgency: due to concern that it could contribute to tunneled PICC line infection, also working her up for potential LVAD and if she has severe allergies, may be prohibitive since she would have chronic dressing to driveline, also feels like throat is tight at times. Please send my note from today and Bensimhon's note from 2/14. I am going to do a video chat with her later on today and try to get a screen shot to send over as well. /referral placed, have office staff faxing notes  2. Increase torsemide to 80 mg am, 60 mg pm. She has BMET scheduled with [facility] already this week. /completed  3. Follow up 8 days./ 30 April @10 :30

## 2019-03-29 NOTE — Addendum Note (Signed)
Encounter addended by: Jovita Kussmaul, RN on: 03/29/2019 11:14 AM  Actions taken: Order list changed, Diagnosis association updated

## 2019-03-29 NOTE — Addendum Note (Signed)
Encounter addended by: Harvie Junior, CMA on: 03/29/2019 11:35 AM  Actions taken: Clinical Note Signed

## 2019-04-06 ENCOUNTER — Telehealth (HOSPITAL_COMMUNITY): Payer: Self-pay | Admitting: Cardiology

## 2019-04-06 ENCOUNTER — Other Ambulatory Visit: Payer: Self-pay

## 2019-04-06 ENCOUNTER — Ambulatory Visit (HOSPITAL_COMMUNITY)
Admission: RE | Admit: 2019-04-06 | Discharge: 2019-04-06 | Disposition: A | Discharge status: Home / Self Care | Payer: Medicare Other | Source: Ambulatory Visit | Attending: Internal Medicine | Admitting: Internal Medicine

## 2019-04-06 DIAGNOSIS — R21 Rash and other nonspecific skin eruption: Secondary | ICD-10-CM

## 2019-04-06 DIAGNOSIS — N183 Chronic kidney disease, stage 3 unspecified: Secondary | ICD-10-CM

## 2019-04-06 DIAGNOSIS — I255 Ischemic cardiomyopathy: Secondary | ICD-10-CM

## 2019-04-06 DIAGNOSIS — K219 Gastro-esophageal reflux disease without esophagitis: Secondary | ICD-10-CM

## 2019-04-06 DIAGNOSIS — I2510 Atherosclerotic heart disease of native coronary artery without angina pectoris: Secondary | ICD-10-CM

## 2019-04-06 DIAGNOSIS — I5022 Chronic systolic (congestive) heart failure: Secondary | ICD-10-CM

## 2019-04-06 DIAGNOSIS — Z720 Tobacco use: Secondary | ICD-10-CM

## 2019-04-06 NOTE — Addendum Note (Signed)
Encounter addended by: Kerry Dory, CMA on: 04/06/2019 11:38 AM  Actions taken: Order list changed, Diagnosis association updated, Clinical Note Signed

## 2019-04-06 NOTE — Telephone Encounter (Signed)
Follow up scheduled for 5/21 @ 130   Advised we will try to schedule for June now, if not we will be in contact with her sometime in June to get scheduled (message sent to schedulers and order placed)  AVS mailed to patient

## 2019-04-06 NOTE — Telephone Encounter (Signed)
-----   Message from Georgiana Shore, NP sent at 04/06/2019 11:07 AM EDT ----- Regarding: Visit orders 1. Follow up in 3 weeks with Dr Haroldine Laws. (if possible. If not, APP okay)  2. PFTs with DLCO - can we put her on recall list or schedule her for early June?

## 2019-04-06 NOTE — Progress Notes (Addendum)
Heart Failure TeleHealth Note  Due to national recommendations of social distancing due to [place] 19, telehealth visit is felt to be most appropriate for this patient at this time.  I discussed the limitations, risks, security and privacy concerns of performing an evaluation and management service by telephone and the availability of in person appointments. I also discussed with the patient that there may be a patient responsible charge related to this service. The patient expressed understanding and agreed to proceed.   ID:  [name], DOB [birth year], MRN [id number]  Location: Home  Provider location: [address] [place] Type of Visit: Established patient   PCP:  Stephens Shire, MD  Cardiologist:  Jenkins Rouge, MD Primary HF: Dr Haroldine Laws  Chief Complaint: HF follow up   History of Present Illness: Terri Million Biggsis a 68 y.o.femalewith a history of CAD, s/p prior Ant MI, s/p prior POBA to LAD, Dx, RCA, CHF due to ICM with high scar burden by MRI (12/09), s/p ICD, HTN, HL, depression,andtobacco abuse.   Admitted 7/10 through 06/28/2018. Admitted with increase dyspnea and and abdominal pain. Required short term milrinone and diuresed with IV lasix. Transitioned to torsemide 40 mg daily. GI consulted for abdominal pain. HIDDA scan was negative. Also had 20% PVCs so mexiletine was started. Discharge weight 115 pounds.   CPX 9/19 severe HF limitation with pVO2 14.2 and slope 40.   She underwent RHC on 12/22/18 notable for low output with MV sat 47% and CI 1.7. PCWP 20. She was admitted and started on milrinone. Druing that admit also underwent PFTs with further decrease in DLCO.  Echo 01/20/19: EF 15-20%, RV normal, mod MR  Patient presents via audio conferencing for a telehealth visit today for 1 week follow up. Last visit torsemide was increased and she was instructed to take PRN metolazone. She was also referred to dermatology with recurrent rash. She took  metolazone last week and increased torsemide to 60 mg BID. She is SOB if she moves quickly, occasionally with ADLs. Abdomen still feels mildly bloated. No peripheral edema. No PND. Chronic 2 pillow orthopnea. No CP. +cough when she goes outside. No fever/chills/sweats. PICC line looks good per her [facility] RN. Having GERD symptoms with burning into her throat. Prilosec and tums are helping. Symptoms improving with taking pills with food. Does not occur with exertion. Appetite generally okay. Not drinking ensures. Weight is 115 lbs today, down 2 lbs from last week. Rash is still present. It itches and burns. Taking pepcid and using cortisone cream, which helps. She has an appointment with dermatology June 22nd. BP 90/60s. Remains off cigarettes.  Labs from [facility] 4/24: creatinine 1.68, K 3.6, dig level 0.7  Pt denies symptoms of cough, fevers, chills, or new SOB worrisome for COVID 19.    Past Medical History:  Diagnosis Date   Arthritis    hands   Asthma    CAD (coronary artery disease)    a. s/p prior Ant MI, b. s/p prior POBA to LAD, Dx, RCA,;  c.  LHC (10/12):  dLAD 40, pCFX 30, OM1 50, pRCA 30;  d.  [facility] Myoview (11/14):  High risk study; inferior, anterior, apical defects; minimal reversibility toward the apex; consistent with prior infarct and very mild peri-infarct ischemia, EF 24%, inferior/apical/anterior akinesis   CHF (congestive heart failure) (HCC)    Chronic systolic heart failure (HCC)    CKD (chronic kidney disease), stage III ([place])    Depression  H/O hiatal hernia    HLD (hyperlipidemia)    HTN (hypertension)    Ischemic cardiomyopathy    MRI (12/09):  High scar burden, Inf and Apical AK, EF 24%.;  Echo (10/14): EF 15%, Gr 2 DD, mild to mod MR, mod LAE, RVSF mildly reduced, mod RAE, severe TR, PASP 49-53 (mod pulmonary HTN)   MI (myocardial infarction) (HCC)    Tobacco abuse    Past Surgical History:  Procedure Laterality Date   ABDOMINAL AORTOGRAM W/LOWER  EXTREMITY N/A 03/02/2018   Procedure: ABDOMINAL AORTOGRAM W/LOWER EXTREMITY;  Surgeon: Wellington Hampshire, MD;  Location: [place] [facility];  Service: Cardiovascular;  Laterality: N/A;   CARDIAC DEFIBRILLATOR PLACEMENT  01/18/2009   MDT single chamber ICD implanted by Dr Rayann Heman    cardiac stents     COLONOSCOPY  03/18/2012   Procedure: COLONOSCOPY;  Surgeon: Beryle Beams, MD;  Location: [facility];  Service: Endoscopy;  Laterality: N/A;   CORONARY ANGIOPLASTY     ESOPHAGOGASTRODUODENOSCOPY N/A 03/10/2013   Procedure: ESOPHAGOGASTRODUODENOSCOPY (EGD);  Surgeon: Beryle Beams, MD;  Location: [facility];  Service: Endoscopy;  Laterality: N/A;   IR FLUORO GUIDE CV LINE RIGHT  12/23/2018   IR US GUIDE VASC ACCESS RIGHT  12/23/2018   LEFT HEART CATHETERIZATION WITH CORONARY ANGIOGRAM N/A 07/26/2014   Procedure: LEFT HEART CATHETERIZATION WITH CORONARY ANGIOGRAM;  Surgeon: Jolaine Artist, MD;  Location: [facility];  Service: Cardiovascular;  Laterality: N/A;   RIGHT HEART CATH N/A 12/22/2018   Procedure: RIGHT HEART CATH;  Surgeon: Jolaine Artist, MD;  Location: [place] [facility];  Service: Cardiovascular;  Laterality: N/A;   RIGHT/LEFT HEART CATH AND CORONARY ANGIOGRAPHY N/A 06/22/2018   Procedure: RIGHT/LEFT HEART CATH AND CORONARY ANGIOGRAPHY;  Surgeon: Larey Dresser, MD;  Location: [place] [facility];  Service: Cardiovascular;  Laterality: N/A;     Current Outpatient Medications  Medication Sig Dispense Refill   aspirin 81 MG tablet Take 81 mg by mouth daily.       citalopram (CELEXA) 20 MG tablet Take 20 mg by mouth daily.     clonazePAM (KLONOPIN) 0.5 MG tablet Take 0.25-0.5 mg by mouth 2 (two) times daily as needed.     digoxin (LANOXIN) 0.125 MG tablet TAKE 1/2 TABLET BY MOUTH EVERY DAY (Patient taking differently: Take 0.0625 mg by mouth daily. ) 45 tablet 3   ezetimibe (ZETIA) 10 MG tablet TAKE 1/2 TABLET TWICE A WEEK (Patient taking differently: Take 5 mg by  mouth 2 (two) times a week. Saturday and Sunday) 15 tablet 3   fish oil-omega-3 fatty acids 1000 MG capsule Take 2 g by mouth daily.      isosorbide mononitrate (IMDUR) 30 MG 24 hr tablet TAKE 1 TABLET BY MOUTH EVERY DAY (Patient taking differently: Take 30 mg by mouth daily. ) 90 tablet 3   loratadine (CLARITIN) 10 MG tablet Take 10 mg by mouth daily.     losartan (COZAAR) 25 MG tablet TAKE 0.5 TABLETS (12.5 MG TOTAL) BY MOUTH 2 (TWO) TIMES DAILY. 60 tablet 11   metolazone (ZAROXOLYN) 2.5 MG tablet Take 1 tablet (2.5 mg total) by mouth as needed for up to 10 days. 10 tablet 0   mexiletine (MEXITIL) 150 MG capsule Take 1 capsule (150 mg total) by mouth 2 (two) times daily. 60 capsule 11   milrinone (PRIMACOR) 20 MG/100 ML SOLN infusion Inject 0.013 mg/min into the vein continuous. 100 mL 11   omeprazole (PRILOSEC) 40 MG capsule  Take 40 mg by mouth daily.      potassium chloride SA (K-DUR,KLOR-CON) 20 MEQ tablet Take 2 tablets (40 mEq total) by mouth as needed. Take when taking Metolazone 20 tablet 0   rosuvastatin (CRESTOR) 5 MG tablet Take 1 tablet by mouth on Monday, Wednesday, and Friday (Patient taking differently: Take 5 mg by mouth 3 (three) times a week. Monday, Wednesday, and Friday) 45 tablet 3   spironolactone (ALDACTONE) 25 MG tablet TAKE 1 TABLET BY MOUTH EVERY DAY 30 tablet 3   torsemide (DEMADEX) 20 MG tablet Take 4 tablets (80 mg total) by mouth every morning AND 3 tablets (60 mg total) at bedtime. (Patient taking differently: Taking 60 mg BID) 630 tablet 0   acetaminophen (TYLENOL ARTHRITIS PAIN) 650 MG CR tablet Take 650 mg by mouth every 8 (eight) hours as needed for pain.      albuterol (PROAIR HFA) 108 (90 Base) MCG/ACT inhaler Inhale 2 puffs into the lungs every 6 (six) hours as needed for wheezing.      azelastine (ASTELIN) 0.1 % nasal spray Place 1 spray into both nostrils daily.      budesonide-formoterol (SYMBICORT) 160-4.5 MCG/ACT inhaler Inhale 2 puffs into  the lungs daily.      feeding supplement, ENSURE ENLIVE, (ENSURE ENLIVE) LIQD Take 237 mLs by mouth 2 (two) times daily between meals. (Patient not taking: Reported on 04/06/2019) 237 mL 12   fluticasone (FLONASE) 50 MCG/ACT nasal spray Place 2 sprays into both nostrils daily.     nicotine (NICODERM CQ - DOSED IN MG/24 HOURS) 14 mg/24hr patch Place 1 patch (14 mg total) onto the skin daily. (Patient not taking: Reported on 04/06/2019) 30 patch 0   nitroGLYCERIN (NITROSTAT) 0.4 MG SL tablet Place 1 tablet (0.4 mg total) under the tongue every 5 (five) minutes as needed for chest pain. (Patient not taking: Reported on 04/06/2019) 25 tablet 3   polyethylene glycol (MIRALAX / GLYCOLAX) packet Take 17 g by mouth at bedtime.     No current facility-administered medications for this encounter.     Allergies:   Prednisone; Zebeta [bisoprolol fumarate]; Eggs or egg-derived products; Penicillins; and Statins   Social History:  The patient  reports that she has been smoking cigarettes. She has smoked for the past 40.00 years. She has never used smokeless tobacco. She reports that she does not drink alcohol or use drugs.   Family History:  The patient's family history includes Breast cancer in her sister; Diabetes Mellitus II in her sister; Stroke in her mother.   ROS:  Please see the history of present illness.   All other systems are personally reviewed and negative.    Exam:  (Video/Tele Health Call; Exam is subjective and or/visual.) General:  Speaks in full sentences. No resp difficulty. Lungs: Normal respiratory effort with conversation.  Abdomen: Mild distension per patient report Extremities: Pt denies edema. Reports that PICC line CDI Neuro: Alert & oriented x 3.   Recent Labs: 09/14/2018: B Natriuretic Peptide 1,056.5 10/11/2018: TSH 2.586 12/23/2018: Magnesium 2.3 01/02/2019: ALT 57; BUN 28; Creatinine, Ser 1.21; Hemoglobin 12.2; Platelets 204; Potassium 3.9; Sodium 134  Personally  reviewed   Wt Readings from Last 3 Encounters:  01/20/19 55.2 kg (121 lb 9.6 oz)  01/02/19 53.6 kg (118 lb 3.2 oz)  12/22/18 51.9 kg (114 lb 5 oz)      ASSESSMENT AND PLAN:  1. Chronic Systolic CHF due to ICM with mild RV dysfunction s/p [employer] ICD.  - Echo 06/2018  EF 15% - Echo2/14/20EF 15-20%, RV normal, mod MR - Chronic NYHA III-IIIB symtoms - CPX 9/19 with severe functional limitation due predominantly to HF. - Recent RHC 1/20 with low output. CI 1.7. Started on home milrinone at 0.25 - NYHA IIIB on home milrinone0.25. - Volume statussounds okay, maybe mildly elevated. She sent in a manual Optivol transmission this week and I have requested from our reps. Will adjust plan if needed once I get it back.  -Continue torsemide 60 mg BID. We discussed starting weekly metolazone, but she is nervous about her kidney function and would like to continue to take PRN. She gets biweekly labs through [facility]. - Continue digoxin 0.0625 mg daily.Dig level 0.7 4/24.  - Continue losartan 12.5 mgBID.  - Continue spironolactone 25 mg daily. - Does not tolerate beta blockers due to symptomatic brady in past. - She has stopped smoking. Repeat PFTs and DLCOwhen able (delayed due to [place]).If improved, could move forward with VAD. I will see if we can get her scheduled for early June or get her on a recall list.  2. CAD s/p previous anterior MI: Cath 8/15 nonobstructive CAD.  - Cath 7/19 non-obstructive CAD.  - Continue ASA and statin. Continue imdur.  - No s/s ischemia. 3. COPD with ongoing tobacco abuse:  -Stopped smoking 1/27. Using nicotine gum.Remains off cigarettes. See above re: PFTs 4. PAD with Intermittent claudication: Medical therapy.  - Moderate bilateral calf claudication worse on the left side due to known SFA disease. This has progressed over past few months  - Had peripheral vascular catheterization 03/02/18 with no significant aortoiliac disease. Diffuse 60-70% disease  affecting the left proximal and mid SFA with short occlusion distally with reconstitution via collaterals from the profunda. Three-vessel runoff below the knee.  - Continue Risk factor management and smoking cessation.No change.  5. Bilateral carotid stenosis - Followed by Dr. Johnsie Cancel.No change.  6. H/o symptomatic bradycardia:  - Stable off BBwith low output. No change.  7. Hyperlipidemia with intolerance of multiple cholesterol meds: - Tolerating Crestor 5 mg three times/week and Zetia 5mg  two times weekly.No change.   8. PVCs - Continue mexilitene 150 mg BID.Denies palpitations.No change.  9. Rash - Has f/u with dermatology in June. Seems to be controlled with pepcid and cortisone cream.  10. GERD - Takes omeprazole 40 mg daily + pepcid PRN. Advised she follow up with PCP for further management.  11. CKD 3 - Creatinie baseline seems to be 1.4-1.6 - Creatinine 1.68 on labs from last week.   COVID screen The patient does not have any symptoms that suggest any further testing/ screening at this time.  Social distancing reinforced today.  Patient Risk: After full review of this patients clinical status, I feel that they are at moderate risk for cardiac decompensation at this time.  Orders/Follow up: Follow up in 2-3 weeks with Dr Haroldine Laws. Set her up for PFTs with DLCO recall/schedule early June.  Today, I have spent 21 minutes with the patient with telehealth technology discussing the above issues.    Signed, Georgiana Shore, NP  04/06/2019 10:43 AM   [facility] [address] [facility] [place] [address] [phone] (office) [phone] (fax)

## 2019-04-06 NOTE — Patient Instructions (Signed)
It was great speaking with you today. No medication changes are needed at this time  Your physician has recommended that you have a pulmonary function test. Pulmonary Function Tests are a group of tests that measure how well air moves in and out of your lungs. -We will try to get this scheduled for sometime in June or we will call you in June to schedule.  Your physician recommends that you schedule a follow-up appointment in: 4 weeks  Virtual-telephone Follow scheduled 04/27/2019 @ 1:30p

## 2019-04-07 ENCOUNTER — Telehealth (HOSPITAL_COMMUNITY): Payer: Self-pay | Admitting: Cardiology

## 2019-04-07 NOTE — Telephone Encounter (Signed)
HHRN called to report REDS vest reading 38%  Per Holley Bouche Patient will need to take a dose of metolazone and repeat weekly thereafter   Pt aware and voiced understanding

## 2019-04-10 ENCOUNTER — Other Ambulatory Visit: Payer: Self-pay

## 2019-04-10 ENCOUNTER — Ambulatory Visit (INDEPENDENT_AMBULATORY_CARE_PROVIDER_SITE_OTHER): Payer: Medicare Other

## 2019-04-10 DIAGNOSIS — I5022 Chronic systolic (congestive) heart failure: Secondary | ICD-10-CM

## 2019-04-10 DIAGNOSIS — Z95810 Presence of automatic (implantable) cardiac defibrillator: Secondary | ICD-10-CM | POA: Diagnosis not present

## 2019-04-11 ENCOUNTER — Telehealth: Payer: Self-pay

## 2019-04-11 NOTE — Progress Notes (Signed)
EPIC Encounter for ICM Monitoring  Patient Name: [name] is a 68 y.o. female Date: 04/11/2019 Primary Care Physican: Stephens Shire, MD Primary Cardiologist: Nishan/Bensimhon Electrophysiologist: Allred 02/14/2019 Weight:115lbs 03/06/2019 Weight: 116 lbs  Battery: 2.64 V  Attempted call to patient and unable to reach. Transmission reviewed.    Optivol Thoracic impedance normal.  Impedance has progressively improved since Feb 2020  Prescribed:Torsemide20 mg take3tablets(60 mg total) by mouthtwice a day.Potassium 20 mEqtake 2 tablets (40 mEq total) by mouth as needed. Take when taking Metolazone.Metolazone 2.5 mgTake 1 tablet (2.5 mg total) by mouth as needed for up to 10 days.  Labs: 03/24/2019 Creatinine 1.53, BUN 47, Potassium 4.0, Sodium 135, GFR 34-39 03/10/2019 Creatinine 1.60, BUN 36, Potassium 4.1, Sodium 138, GFR 43-50 02/24/2019 Creatinine 1.39, BUN 28, Potassium 4.3, Sodium 137, GFR 38-44 01/26/2019 Creatinine 1.40, BUN 38, Potassium 4.8, Sodium 140, GFR 38-44 01/13/2019 Creatinine 1.20, BUN 20, Potassium 3.7, Sodium 137, GFR 46-53 01/02/2019 Creatinine 1.21, BUN 28, Potassium 3.9, Sodium 134, GFR 46-53  Recommendations: Unable to reach.   Follow-up plan: ICM clinic phone appointment on6/07/2019.Visit with Dr Rayann Heman 04/17/2019.  Appointment 04/27/2019 with HF clinic NP/PA.   Copy of ICM check sent to Dr.Allred.  3 month ICM trend: 04/10/2019    1 Year ICM trend:       Rosalene Billings, RN 04/11/2019 11:02 AM

## 2019-04-11 NOTE — Telephone Encounter (Signed)
Remote ICM transmission received.  Attempted call to patient regarding ICM remote transmission and no answer or answering machine. 

## 2019-04-14 ENCOUNTER — Telehealth: Payer: Self-pay

## 2019-04-14 NOTE — Telephone Encounter (Signed)
Left message regarding appt on 04/17/19. 

## 2019-04-17 ENCOUNTER — Other Ambulatory Visit (HOSPITAL_COMMUNITY): Payer: Self-pay | Admitting: Internal Medicine

## 2019-04-17 ENCOUNTER — Telehealth (INDEPENDENT_AMBULATORY_CARE_PROVIDER_SITE_OTHER): Payer: Medicare Other | Admitting: Internal Medicine

## 2019-04-17 ENCOUNTER — Other Ambulatory Visit: Payer: Self-pay

## 2019-04-17 DIAGNOSIS — I5022 Chronic systolic (congestive) heart failure: Secondary | ICD-10-CM

## 2019-04-17 DIAGNOSIS — I259 Chronic ischemic heart disease, unspecified: Secondary | ICD-10-CM

## 2019-04-17 DIAGNOSIS — I454 Nonspecific intraventricular block: Secondary | ICD-10-CM | POA: Diagnosis not present

## 2019-04-17 NOTE — Progress Notes (Signed)
Electrophysiology TeleHealth Note   Due to national recommendations of social distancing due to [place] 19, an audio  telehealth visit is felt to be most appropriate for this patient at this time.  Verbal consent was obtained from her today.  She reports that she has limited technology and is unable to do a virtual visit today.   Date:  04/17/2019   ID:  [name], DOB [birth year], MRN [id number]  Location: patient's home  Provider location: [address], [place]  Evaluation Performed: Follow-up visit  PCP:  Stephens Shire, MD  Cardiologist:  Jenkins Rouge, MD  CHF:  Dr Haroldine Laws Electrophysiologist:  Dr Rayann Heman  Chief Complaint:  CHF  History of Present Illness:    [name] is a 68 y.o. female who presents via audio conferencing for a telehealth visit today.  Since last being seen in our clinic, the patient reports doing reasonably well. She has NYHA Class IIIb CHF and is on home milrinone.  Today, she denies symptoms of palpitations, chest pain, shortness of breath,  lower extremity edema, dizziness, presyncope, or syncope.  The patient is otherwise without complaint today.  The patient denies symptoms of fevers, chills, cough, or new SOB worrisome for COVID 19.  Past Medical History:  Diagnosis Date  . Arthritis    hands  . Asthma   . CAD (coronary artery disease)    a. s/p prior Ant MI, b. s/p prior POBA to LAD, Dx, RCA,;  c.  LHC (10/12):  dLAD 40, pCFX 30, OM1 50, pRCA 30;  d.  Carlton Adam Myoview (11/14):  High risk study; inferior, anterior, apical defects; minimal reversibility toward the apex; consistent with prior infarct and very mild peri-infarct ischemia, EF 24%, inferior/apical/anterior akinesis  . CHF (congestive heart failure) ([place])   . Chronic systolic heart failure ([place])   . CKD (chronic kidney disease), stage III ([place])   . Depression   . H/O hiatal hernia   . HLD (hyperlipidemia)   . HTN (hypertension)   . Ischemic cardiomyopathy    MRI  (12/09):  High scar burden, Inf and Apical AK, EF 24%.;  Echo (10/14): EF 15%, Gr 2 DD, mild to mod MR, mod LAE, RVSF mildly reduced, mod RAE, severe TR, PASP 49-53 (mod pulmonary HTN)  . MI (myocardial infarction) ([place])   . Tobacco abuse     Past Surgical History:  Procedure Laterality Date  . ABDOMINAL AORTOGRAM W/LOWER EXTREMITY N/A 03/02/2018   Procedure: ABDOMINAL AORTOGRAM W/LOWER EXTREMITY;  Surgeon: Wellington Hampshire, MD;  Location: [facility];  Service: Cardiovascular;  Laterality: N/A;  . CARDIAC DEFIBRILLATOR PLACEMENT  01/18/2009   MDT single chamber ICD implanted by Dr Rayann Heman   . cardiac stents    . COLONOSCOPY  03/18/2012   Procedure: COLONOSCOPY;  Surgeon: Beryle Beams, MD;  Location: [facility];  Service: Endoscopy;  Laterality: N/A;  . CORONARY ANGIOPLASTY    . ESOPHAGOGASTRODUODENOSCOPY N/A 03/10/2013   Procedure: ESOPHAGOGASTRODUODENOSCOPY (EGD);  Surgeon: Beryle Beams, MD;  Location: [facility];  Service: Endoscopy;  Laterality: N/A;  . IR FLUORO GUIDE CV LINE RIGHT  12/23/2018  . IR US GUIDE VASC ACCESS RIGHT  12/23/2018  . LEFT HEART CATHETERIZATION WITH CORONARY ANGIOGRAM N/A 07/26/2014   Procedure: LEFT HEART CATHETERIZATION WITH CORONARY ANGIOGRAM;  Surgeon: Jolaine Artist, MD;  Location: [facility];  Service: Cardiovascular;  Laterality: N/A;  . RIGHT HEART CATH N/A 12/22/2018   Procedure: RIGHT HEART CATH;  Surgeon: Jolaine Artist, MD;  Location: [facility];  Service: Cardiovascular;  Laterality: N/A;  . RIGHT/LEFT HEART CATH AND CORONARY ANGIOGRAPHY N/A 06/22/2018   Procedure: RIGHT/LEFT HEART CATH AND CORONARY ANGIOGRAPHY;  Surgeon: Larey Dresser, MD;  Location: [facility];  Service: Cardiovascular;  Laterality: N/A;    Current Outpatient Medications  Medication Sig Dispense Refill  . acetaminophen (TYLENOL ARTHRITIS PAIN) 650 MG CR tablet Take 650 mg by mouth every 8 (eight) hours as needed for pain.     Marland Kitchen albuterol  (PROAIR HFA) 108 (90 Base) MCG/ACT inhaler Inhale 2 puffs into the lungs every 6 (six) hours as needed for wheezing.     Marland Kitchen aspirin 81 MG tablet Take 81 mg by mouth daily.      Marland Kitchen azelastine (ASTELIN) 0.1 % nasal spray Place 1 spray into both nostrils daily.     . budesonide-formoterol (SYMBICORT) 160-4.5 MCG/ACT inhaler Inhale 2 puffs into the lungs daily.     . citalopram (CELEXA) 20 MG tablet Take 20 mg by mouth daily.    . clonazePAM (KLONOPIN) 0.5 MG tablet Take 0.25-0.5 mg by mouth 2 (two) times daily as needed.    . digoxin (LANOXIN) 0.125 MG tablet TAKE 1/2 TABLET BY MOUTH EVERY DAY (Patient taking differently: Take 0.0625 mg by mouth daily. ) 45 tablet 3  . ezetimibe (ZETIA) 10 MG tablet TAKE 1/2 TABLET TWICE A WEEK (Patient taking differently: Take 5 mg by mouth 2 (two) times a week. Saturday and Sunday) 15 tablet 3  . fish oil-omega-3 fatty acids 1000 MG capsule Take 2 g by mouth daily.     . fluticasone (FLONASE) 50 MCG/ACT nasal spray Place 2 sprays into both nostrils daily.    . isosorbide mononitrate (IMDUR) 30 MG 24 hr tablet TAKE 1 TABLET BY MOUTH EVERY DAY (Patient taking differently: Take 30 mg by mouth daily. ) 90 tablet 3  . loratadine (CLARITIN) 10 MG tablet Take 10 mg by mouth daily.    Marland Kitchen losartan (COZAAR) 25 MG tablet TAKE 0.5 TABLETS (12.5 MG TOTAL) BY MOUTH 2 (TWO) TIMES DAILY. 60 tablet 11  . metolazone (ZAROXOLYN) 2.5 MG tablet Take 1 tablet (2.5 mg total) by mouth as needed for up to 10 days. 10 tablet 0  . mexiletine (MEXITIL) 150 MG capsule Take 1 capsule (150 mg total) by mouth 2 (two) times daily. 60 capsule 11  . milrinone (PRIMACOR) 20 MG/100 ML SOLN infusion Inject 0.013 mg/min into the vein continuous. 100 mL 11  . nitroGLYCERIN (NITROSTAT) 0.4 MG SL tablet Place 1 tablet (0.4 mg total) under the tongue every 5 (five) minutes as needed for chest pain. 25 tablet 3  . omeprazole (PRILOSEC) 40 MG capsule Take 40 mg by mouth daily.     . polyethylene glycol (MIRALAX /  GLYCOLAX) packet Take 17 g by mouth at bedtime.    . potassium chloride SA (K-DUR,KLOR-CON) 20 MEQ tablet Take 2 tablets (40 mEq total) by mouth as needed. Take when taking Metolazone 20 tablet 0  . rosuvastatin (CRESTOR) 5 MG tablet Take 1 tablet by mouth on Monday, Wednesday, and Friday (Patient taking differently: Take 5 mg by mouth 3 (three) times a week. Monday, Wednesday, and Friday) 45 tablet 3  . spironolactone (ALDACTONE) 25 MG tablet TAKE 1 TABLET BY MOUTH EVERY DAY 30 tablet 3  . torsemide (DEMADEX) 20 MG tablet Take 4 tablets (80 mg total) by mouth every morning AND 3 tablets (60 mg total) at bedtime. (  Patient taking differently: Taking 60 mg BID) 630 tablet 0   No current facility-administered medications for this visit.     Allergies:   Prednisone; Zebeta [bisoprolol fumarate]; Eggs or egg-derived products; Penicillins; and Statins   Social History:  The patient  reports that she has been smoking cigarettes. She has smoked for the past 40.00 years. She has never used smokeless tobacco. She reports that she does not drink alcohol or use drugs.   Family History:  The patient's family history includes Breast cancer in her sister; Diabetes Mellitus II in her sister; Stroke in her mother.   ROS:  Please see the history of present illness.   All other systems are personally reviewed and negative.    Exam:    Vital Signs:  There were no vitals taken for this visit.  Well sounding   Labs/Other Tests and Data Reviewed:    Recent Labs: 09/14/2018: B Natriuretic Peptide 1,056.5 10/11/2018: TSH 2.586 12/23/2018: Magnesium 2.3 01/02/2019: ALT 57; BUN 28; Creatinine, Ser 1.21; Hemoglobin 12.2; Platelets 204; Potassium 3.9; Sodium 134   Wt Readings from Last 3 Encounters:  01/20/19 121 lb 9.6 oz (55.2 kg)  01/02/19 118 lb 3.2 oz (53.6 kg)  12/22/18 114 lb 5 oz (51.9 kg)     Other studies personally reviewed: Additional studies/ records that were reviewed today include: CHF clinic  notes , ICM clinic note 04/2019 Review of the above records today demonstrates: as above Prior radiographs  CXR reveals stable single lead ICD   Last device remote is reviewed from [facility] PDF dated 01/2019 which reveals normal device function, no arrhythmias    ASSESSMENT & PLAN:    1.  CAD/ ischemic CM/ chronic systolic dysfunction NYHA Class IIIb symptoms with home milrinone She was being evaluated for VAD prior to covid 19.  She has IVCD with QRS > 130 msec.  Though she does now meet criteria for CRT, I am not convinced that she will receive significant beneift.  She did have CHF with QRS < 130 msec on my last visit.  I will reach out to Dr Haroldine Laws for his opinion.  She is approaching ERI. She has a few months likely remaining on her battery.  I will also consider with Dr Haroldine Laws whether or not we replace her ICD if she does indeed receive a VAD prior to that time.   COVID 19 screen The patient denies symptoms of COVID 19 at this time.  The importance of social distancing was discussed today.  Follow-up:  With me in the office in 6 months, unless she reaches ERI prior to that time Next remote: 04/2019  Current medicines are reviewed at length with the patient today.   The patient does not have concerns regarding her medicines.  The following changes were made today:  none  Labs/ tests ordered today include:  No orders of the defined types were placed in this encounter.  Patient Risk:  after full review of this patients clinical status, I feel that they are at high risk at this time.  Today, I have spent 22 minutes with the patient with telehealth technology discussing CHF .    Army Fossa, MD  04/17/2019 2:55 PM     [facility] [address] [place] [address] [phone] (office) [phone] (fax)

## 2019-04-18 ENCOUNTER — Other Ambulatory Visit (HOSPITAL_COMMUNITY): Payer: Self-pay | Admitting: Internal Medicine

## 2019-04-23 DIAGNOSIS — I5023 Acute on chronic systolic (congestive) heart failure: Secondary | ICD-10-CM | POA: Diagnosis not present

## 2019-04-27 ENCOUNTER — Ambulatory Visit (HOSPITAL_COMMUNITY)
Admission: RE | Admit: 2019-04-27 | Discharge: 2019-04-27 | Disposition: A | Discharge status: Home / Self Care | Payer: Medicare Other | Source: Ambulatory Visit | Attending: Cardiology | Admitting: Cardiology

## 2019-04-27 ENCOUNTER — Other Ambulatory Visit: Payer: Self-pay

## 2019-04-27 VITALS — Wt 116.0 lb

## 2019-04-27 DIAGNOSIS — I2510 Atherosclerotic heart disease of native coronary artery without angina pectoris: Secondary | ICD-10-CM

## 2019-04-27 DIAGNOSIS — I5022 Chronic systolic (congestive) heart failure: Secondary | ICD-10-CM

## 2019-04-27 DIAGNOSIS — I259 Chronic ischemic heart disease, unspecified: Secondary | ICD-10-CM

## 2019-04-27 DIAGNOSIS — N183 Chronic kidney disease, stage 3 unspecified: Secondary | ICD-10-CM

## 2019-04-27 DIAGNOSIS — I493 Ventricular premature depolarization: Secondary | ICD-10-CM

## 2019-04-27 NOTE — Addendum Note (Signed)
Encounter addended by: Valeda Malm, RN on: 04/27/2019 2:53 PM  Actions taken: Clinical Note Signed

## 2019-04-27 NOTE — Progress Notes (Signed)
AVS mailed to patient.

## 2019-04-27 NOTE — Progress Notes (Signed)
Spoke with patient re: AVS.  She is aware of medication change.  Pt states she does not need refills at this time.  She will be contacted regarding PFT's and f/u appointment. Verbalized understanding. AVS mailed.

## 2019-04-27 NOTE — Addendum Note (Signed)
Encounter addended by: Valeda Malm, RN on: 04/27/2019 2:34 PM  Actions taken: Clinical Note Signed

## 2019-04-27 NOTE — Progress Notes (Signed)
Left message on VM of respiratory to schedule patient for PFT's

## 2019-04-27 NOTE — Progress Notes (Signed)
Heart Failure TeleHealth Note  Due to national recommendations of social distancing due to [place] 19, telehealth visit is felt to be most appropriate for this patient at this time.  I discussed the limitations, risks, security and privacy concerns of performing an evaluation and management service by telephone and the availability of in person appointments. I also discussed with the patient that there may be a patient responsible charge related to this service. The patient expressed understanding and agreed to proceed.   ID:  [name], DOB 1951/07/20, MRN [id number]  Location: Home  Provider location: [address], [place] Type of Visit: Established patient   PCP:  Stephens Shire, MD  Cardiologist:  Jenkins Rouge, MD Primary HF: Dr Haroldine Laws EP: Dr Rayann Heman  Chief Complaint: HF follow up   History of Present Illness: Terri Amerman Biggsis a 67 y.o.femalewith a history of CAD, s/p prior Ant MI, s/p prior POBA to LAD, Dx, RCA, CHF due to ICM with high scar burden by MRI (12/09), s/p ICD, HTN, HL, depression,andtobacco abuse.   Admitted 7/10 through 06/28/2018. Admitted with increase dyspnea and and abdominal pain. Required short term milrinone and diuresed with IV lasix. Transitioned to torsemide 40 mg daily. GI consulted for abdominal pain. HIDDA scan was negative. Also had 20% PVCs so mexiletine was started. Discharge weight 115 pounds.   CPX 9/19 severe HF limitation with pVO2 14.2 and slope 40.   She underwent RHC on 12/22/18 notable for low output with MV sat 47% and CI 1.7. PCWP 20. She was admitted and started on milrinone. Druing that admit also underwent PFTs with further decrease in DLCO.  Echo 01/20/19: EF 15-20%, RV normal, mod MR  Patient presents via audio conferencing for a telehealth visit today. Overall doing okay. She has taken metolazone about once/week. No problems with PICC line. No fever. She is more SOB than before, some with ADLs. Good UOP with  torsemide. No bloating or edema today. Chronic 2 pillow orthopnea. No PND. She still has an itchy rash to chest. Sees dermatology in 1 month. GERD has improved with eating prior to taking pills. Appetite up and down. Energy level okay, but not very active right now due to weather. Weights 114-117 lbs, 116 lbs today. Remains off cigarettes.   Pt denies symptoms of cough, fevers, chills, or new SOB worrisome for COVID 19.    Past Medical History:  Diagnosis Date  . Arthritis    hands  . Asthma   . CAD (coronary artery disease)    a. s/p prior Ant MI, b. s/p prior POBA to LAD, Dx, RCA,;  c.  LHC (10/12):  dLAD 40, pCFX 30, OM1 50, pRCA 30;  d.  Carlton Adam Myoview (11/14):  High risk study; inferior, anterior, apical defects; minimal reversibility toward the apex; consistent with prior infarct and very mild peri-infarct ischemia, EF 24%, inferior/apical/anterior akinesis  . CHF (congestive heart failure) ([place])   . Chronic systolic heart failure ([place])   . CKD (chronic kidney disease), stage III ([place])   . Depression   . H/O hiatal hernia   . HLD (hyperlipidemia)   . HTN (hypertension)   . Ischemic cardiomyopathy    MRI (12/09):  High scar burden, Inf and Apical AK, EF 24%.;  Echo (10/14): EF 15%, Gr 2 DD, mild to mod MR, mod LAE, RVSF mildly reduced, mod RAE, severe TR, PASP 49-53 (mod pulmonary HTN)  . MI (myocardial infarction) ([place])   . Tobacco abuse  Past Surgical History:  Procedure Laterality Date  . ABDOMINAL AORTOGRAM W/LOWER EXTREMITY N/A 03/02/2018   Procedure: ABDOMINAL AORTOGRAM W/LOWER EXTREMITY;  Surgeon: Wellington Hampshire, MD;  Location: [facility];  Service: Cardiovascular;  Laterality: N/A;  . CARDIAC DEFIBRILLATOR PLACEMENT  01/18/2009   MDT single chamber ICD implanted by Dr Rayann Heman   . cardiac stents    . COLONOSCOPY  03/18/2012   Procedure: COLONOSCOPY;  Surgeon: Beryle Beams, MD;  [facility]: [facility];  Service: Endoscopy;  Laterality: N/A;  . CORONARY  ANGIOPLASTY    . ESOPHAGOGASTRODUODENOSCOPY N/A 03/10/2013   Procedure: ESOPHAGOGASTRODUODENOSCOPY (EGD);  Surgeon: Beryle Beams, MD;  Location: [facility];  Service: Endoscopy;  Laterality: N/A;  . IR FLUORO GUIDE CV LINE RIGHT  12/23/2018  . IR US GUIDE VASC ACCESS RIGHT  12/23/2018  . LEFT HEART CATHETERIZATION WITH CORONARY ANGIOGRAM N/A 07/26/2014   Procedure: LEFT HEART CATHETERIZATION WITH CORONARY ANGIOGRAM;  Surgeon: Jolaine Artist, MD;  Location: [facility] LAB;  Service: Cardiovascular;  Laterality: N/A;  . RIGHT HEART CATH N/A 12/22/2018   Procedure: RIGHT HEART CATH;  Surgeon: Jolaine Artist, MD;  Location: [facility];  Service: Cardiovascular;  Laterality: N/A;  . RIGHT/LEFT HEART CATH AND CORONARY ANGIOGRAPHY N/A 06/22/2018   Procedure: RIGHT/LEFT HEART CATH AND CORONARY ANGIOGRAPHY;  Surgeon: Larey Dresser, MD;  Location: [facility];  Service: Cardiovascular;  Laterality: N/A;     Current Outpatient Medications  Medication Sig Dispense Refill  . aspirin 81 MG tablet Take 81 mg by mouth daily.      . digoxin (LANOXIN) 0.125 MG tablet TAKE 1/2 TABLET BY MOUTH EVERY DAY (Patient taking differently: Take 0.0625 mg by mouth daily. ) 45 tablet 3  . isosorbide mononitrate (IMDUR) 30 MG 24 hr tablet TAKE 1 TABLET BY MOUTH EVERY DAY (Patient taking differently: Take 30 mg by mouth daily. ) 90 tablet 3  . losartan (COZAAR) 25 MG tablet TAKE 0.5 TABLETS (12.5 MG TOTAL) BY MOUTH 2 (TWO) TIMES DAILY. 60 tablet 11  . metolazone (ZAROXOLYN) 2.5 MG tablet Take 1 tablet (2.5 mg total) by mouth as needed for up to 10 days. (Patient taking differently: Take 2.5 mg by mouth as needed. Taking once weekly) 10 tablet 0  . mexiletine (MEXITIL) 150 MG capsule Take 1 capsule (150 mg total) by mouth 2 (two) times daily. 60 capsule 11  . milrinone (PRIMACOR) 20 MG/100 ML SOLN infusion Inject 0.013 mg/min into the vein continuous. 100 mL 11  . potassium chloride SA (K-DUR,KLOR-CON) 20  MEQ tablet Take 2 tablets (40 mEq total) by mouth as needed. Take when taking Metolazone 20 tablet 0  . rosuvastatin (CRESTOR) 5 MG tablet Take 1 tablet by mouth on Monday, Wednesday, and Friday (Patient taking differently: Take 5 mg by mouth 3 (three) times a week. Monday, Wednesday, and Friday) 45 tablet 3  . spironolactone (ALDACTONE) 25 MG tablet TAKE 1 TABLET BY MOUTH EVERY DAY 30 tablet 3  . torsemide (DEMADEX) 20 MG tablet Take 4 tablets (80 mg total) by mouth every morning AND 3 tablets (60 mg total) at bedtime. (Patient taking differently: Taking 60 mg BID) 630 tablet 0  . acetaminophen (TYLENOL ARTHRITIS PAIN) 650 MG CR tablet Take 650 mg by mouth every 8 (eight) hours as needed for pain.     Marland Kitchen albuterol (PROAIR HFA) 108 (90 Base) MCG/ACT inhaler Inhale 2 puffs into the lungs every 6 (six) hours as needed for wheezing.     Marland Kitchen  azelastine (ASTELIN) 0.1 % nasal spray Place 1 spray into both nostrils daily.     . budesonide-formoterol (SYMBICORT) 160-4.5 MCG/ACT inhaler Inhale 2 puffs into the lungs daily.     . citalopram (CELEXA) 20 MG tablet Take 20 mg by mouth daily.    . clonazePAM (KLONOPIN) 0.5 MG tablet Take 0.25-0.5 mg by mouth 2 (two) times daily as needed.    . ezetimibe (ZETIA) 10 MG tablet TAKE 1/2 TABLET TWICE A WEEK (Patient taking differently: Take 5 mg by mouth 2 (two) times a week. Saturday and Sunday) 15 tablet 3  . fish oil-omega-3 fatty acids 1000 MG capsule Take 2 g by mouth daily.     . fluticasone (FLONASE) 50 MCG/ACT nasal spray Place 2 sprays into both nostrils daily.    Marland Kitchen loratadine (CLARITIN) 10 MG tablet Take 10 mg by mouth daily.    . nitroGLYCERIN (NITROSTAT) 0.4 MG SL tablet Place 1 tablet (0.4 mg total) under the tongue every 5 (five) minutes as needed for chest pain. 25 tablet 3  . omeprazole (PRILOSEC) 40 MG capsule Take 40 mg by mouth daily.     . polyethylene glycol (MIRALAX / GLYCOLAX) packet Take 17 g by mouth at bedtime.     No current  facility-administered medications for this encounter.     Allergies:   Prednisone; Zebeta [bisoprolol fumarate]; Eggs or egg-derived products; Penicillins; and Statins   Social History:  The patient  reports that she has been smoking cigarettes. She has smoked for the past 40.00 years. She has never used smokeless tobacco. She reports that she does not drink alcohol or use drugs.   Family History:  The patient's family history includes Breast cancer in her sister; Diabetes Mellitus II in her sister; Stroke in her mother.   ROS:  Please see the history of present illness.   All other systems are personally reviewed and negative.    Exam:  (Video/Tele Health Call; Exam is subjective and or/visual.) General:  Speaks in full sentences. No resp difficulty. Lungs: Normal respiratory effort with conversation.  Abdomen: No distension per patient report Extremities: Pt denies edema. Neuro: Alert & oriented x 3.   Recent Labs: 09/14/2018: B Natriuretic Peptide 1,056.5 10/11/2018: TSH 2.586 12/23/2018: Magnesium 2.3 01/02/2019: ALT 57; BUN 28; Creatinine, Ser 1.21; Hemoglobin 12.2; Platelets 204; Potassium 3.9; Sodium 134  Personally reviewed   Wt Readings from Last 3 Encounters:  04/27/19 52.6 kg (116 lb)  01/20/19 55.2 kg (121 lb 9.6 oz)  01/02/19 53.6 kg (118 lb 3.2 oz)      ASSESSMENT AND PLAN:  1. Chronic Systolic CHF due to ICM with mild RV dysfunction s/p [employer] ICD.  - Echo 06/2018 EF 15% - Echo2/14/20EF 15-20%, RV normal, mod MR - Chronic NYHA III-IIIB symtoms - CPX 9/19 with severe functional limitation due predominantly to HF. - Recent RHC 1/20 with low output. CI 1.7. Started on home milrinone at 0.25 - NYHA IIIB on home milrinone0.25.[facility] follows. - Volume status sounds mildly elevated. -Increase torsemide to 80 mg am, 60 mg pm. We discussed starting weekly metolazone, but she is nervous about her kidney function and would like to continue to take PRN. She gets  [facility]. - Continue digoxin 0.0625 mg daily.Dig level 0.7 4/24.  - Continue losartan 12.5 mgBID.  - Continue spironolactone 25 mg daily. - Does not tolerate beta blockers due to symptomatic brady in past. - She has stopped smoking. Repeat PFTs and DLCOwhen able (delayed due to [place]).If  improved, could move forward with VAD. I will try to get these scheduled. - Her ICD is at [facility]. Dr Rayann Heman and Dr Haroldine Laws discussing whether she will need ICD replacement since she is ?pending VAD. They discussed CRT-D upgrade if she does get a replacement.  2. CAD s/p previous anterior MI: Cath 8/15 nonobstructive CAD.  - Cath 7/19 non-obstructive CAD.  - Continue ASA and statin. Continue imdur.  - No s/s ischemia.  3. COPD with ongoing tobacco abuse:  -Stopped smoking 1/27. Using nicotine gum.Remains off cigarettes. See above re: PFTs 4. PAD with Intermittent claudication: Medical therapy.  - Moderate bilateral calf claudication worse on the left side due to known SFA disease. This has progressed over past few months  - Had peripheral vascular catheterization 03/02/18 with no significant aortoiliac disease. Diffuse 60-70% disease affecting the left proximal and mid SFA with short occlusion distally with reconstitution via collaterals from the profunda. Three-vessel runoff below the knee.  - Continue Risk factor management and smoking cessation.No change.  5. Bilateral carotid stenosis - Followed by Dr. Johnsie Cancel.No change.  6. H/o symptomatic bradycardia:  - Stable off BBwith low output.No change.  7. Hyperlipidemia with intolerance of multiple cholesterol meds: - Tolerating Crestor 5 mg three times/week and Zetia 5mg  two times weekly.No change.  8. PVCs - Continue mexilitene 150 mg BID.Denies pelpitations  9. Rash - Has f/u with dermatology in June. Decently controlled with pepcid and cortisone cream.  10. CKD 3 - Creatinie baseline seems to be 1.4-1.6 - Stable 1.59 on  5/1. Gets labs biweekly [facility]   COVID screen The patient does not have any symptoms that suggest any further testing/ screening at this time.  Social distancing reinforced today.  Patient Risk: After full review of this patients clinical status, I feel that they are at moderate risk for cardiac decompensation at this time.  Orders/Follow up: Follow up with [facility] 4 weeks. PFTs with DLCO phase 1. Increase torsemide to 80 mg am, 60 mg pm.   Today, I have spent 17 minutes with the patient with telehealth technology discussing CHF.    Signed, Georgiana Shore, NP  04/27/2019 1:52 PM   [facility] [address] Heart and [place] [address] [phone] (office) [phone] (fax)

## 2019-04-27 NOTE — Addendum Note (Signed)
Encounter addended by: Harvie Junior, CMA on: 04/27/2019 3:27 PM  Actions taken: Clinical Note Signed

## 2019-04-27 NOTE — Patient Instructions (Addendum)
Increase torsemide to 80 mg (4 tabs) in the morning and  60 mg (3 tabs) in the evening.   PFTs with DLCO - phase 1. These have been ordered previously, but delayed with COVID.  Office will try arrange this and you will be contacted for an appointment.   Follow up with [facility] 4 weeks. You will get a phone call to schedule your appointment.

## 2019-04-30 ENCOUNTER — Other Ambulatory Visit (HOSPITAL_COMMUNITY): Payer: Self-pay | Admitting: Adult Health

## 2019-05-04 ENCOUNTER — Ambulatory Visit (INDEPENDENT_AMBULATORY_CARE_PROVIDER_SITE_OTHER): Payer: Medicare Other | Admitting: *Deleted

## 2019-05-04 DIAGNOSIS — I255 Ischemic cardiomyopathy: Secondary | ICD-10-CM | POA: Diagnosis not present

## 2019-05-04 LAB — CUP PACEART REMOTE DEVICE CHECK
Battery Voltage: 2.64 V
Brady Statistic RV Percent Paced: 0 %
Date Time Interrogation Session: 20200528113625
HighPow Impedance: 71 Ohm
Implantable Lead Implant Date: 20100212
Implantable Lead Location: 753860
Implantable Lead Model: 6935
Implantable Pulse Generator Implant Date: 20100212
Lead Channel Impedance Value: 496 Ohm
Lead Channel Sensing Intrinsic Amplitude: 8.9 mV
Lead Channel Setting Pacing Amplitude: 2.5 V
Lead Channel Setting Pacing Pulse Width: 0.4 ms
Lead Channel Setting Sensing Sensitivity: 0.3 mV

## 2019-05-07 ENCOUNTER — Emergency Department (HOSPITAL_COMMUNITY): Payer: Medicare Other

## 2019-05-07 ENCOUNTER — Inpatient Hospital Stay (HOSPITAL_COMMUNITY)
Admission: EM | Admit: 2019-05-07 | Discharge: 2019-05-09 | DRG: 640 | Disposition: A | Discharge status: Home Health | Payer: Medicare Other | Attending: Internal Medicine | Admitting: Internal Medicine

## 2019-05-07 ENCOUNTER — Encounter (HOSPITAL_COMMUNITY): Payer: Self-pay | Admitting: Emergency Medicine

## 2019-05-07 ENCOUNTER — Other Ambulatory Visit: Payer: Self-pay

## 2019-05-07 DIAGNOSIS — I70212 Atherosclerosis of native arteries of extremities with intermittent claudication, left leg: Secondary | ICD-10-CM | POA: Diagnosis present

## 2019-05-07 DIAGNOSIS — I2510 Atherosclerotic heart disease of native coronary artery without angina pectoris: Secondary | ICD-10-CM | POA: Diagnosis present

## 2019-05-07 DIAGNOSIS — Z7951 Long term (current) use of inhaled steroids: Secondary | ICD-10-CM

## 2019-05-07 DIAGNOSIS — M19042 Primary osteoarthritis, left hand: Secondary | ICD-10-CM | POA: Diagnosis present

## 2019-05-07 DIAGNOSIS — I130 Hypertensive heart and chronic kidney disease with heart failure and stage 1 through stage 4 chronic kidney disease, or unspecified chronic kidney disease: Secondary | ICD-10-CM | POA: Diagnosis present

## 2019-05-07 DIAGNOSIS — F17210 Nicotine dependence, cigarettes, uncomplicated: Secondary | ICD-10-CM | POA: Diagnosis present

## 2019-05-07 DIAGNOSIS — Z91012 Allergy to eggs: Secondary | ICD-10-CM

## 2019-05-07 DIAGNOSIS — E876 Hypokalemia: Secondary | ICD-10-CM | POA: Diagnosis not present

## 2019-05-07 DIAGNOSIS — M19041 Primary osteoarthritis, right hand: Secondary | ICD-10-CM | POA: Diagnosis present

## 2019-05-07 DIAGNOSIS — I447 Left bundle-branch block, unspecified: Secondary | ICD-10-CM | POA: Diagnosis present

## 2019-05-07 DIAGNOSIS — N183 Chronic kidney disease, stage 3 (moderate): Secondary | ICD-10-CM | POA: Diagnosis present

## 2019-05-07 DIAGNOSIS — I252 Old myocardial infarction: Secondary | ICD-10-CM

## 2019-05-07 DIAGNOSIS — I5022 Chronic systolic (congestive) heart failure: Secondary | ICD-10-CM | POA: Diagnosis present

## 2019-05-07 DIAGNOSIS — Z888 Allergy status to other drugs, medicaments and biological substances status: Secondary | ICD-10-CM

## 2019-05-07 DIAGNOSIS — I4901 Ventricular fibrillation: Secondary | ICD-10-CM

## 2019-05-07 DIAGNOSIS — Z1159 Encounter for screening for other viral diseases: Secondary | ICD-10-CM

## 2019-05-07 DIAGNOSIS — Z79899 Other long term (current) drug therapy: Secondary | ICD-10-CM

## 2019-05-07 DIAGNOSIS — F329 Major depressive disorder, single episode, unspecified: Secondary | ICD-10-CM | POA: Diagnosis present

## 2019-05-07 DIAGNOSIS — I272 Pulmonary hypertension, unspecified: Secondary | ICD-10-CM | POA: Diagnosis present

## 2019-05-07 DIAGNOSIS — Z880 Allergy status to penicillin: Secondary | ICD-10-CM

## 2019-05-07 DIAGNOSIS — I071 Rheumatic tricuspid insufficiency: Secondary | ICD-10-CM | POA: Diagnosis present

## 2019-05-07 DIAGNOSIS — Z7982 Long term (current) use of aspirin: Secondary | ICD-10-CM

## 2019-05-07 DIAGNOSIS — R55 Syncope and collapse: Secondary | ICD-10-CM

## 2019-05-07 DIAGNOSIS — J449 Chronic obstructive pulmonary disease, unspecified: Secondary | ICD-10-CM | POA: Diagnosis present

## 2019-05-07 DIAGNOSIS — Z803 Family history of malignant neoplasm of breast: Secondary | ICD-10-CM

## 2019-05-07 DIAGNOSIS — I255 Ischemic cardiomyopathy: Secondary | ICD-10-CM | POA: Diagnosis present

## 2019-05-07 DIAGNOSIS — Z95810 Presence of automatic (implantable) cardiac defibrillator: Secondary | ICD-10-CM

## 2019-05-07 DIAGNOSIS — Z955 Presence of coronary angioplasty implant and graft: Secondary | ICD-10-CM

## 2019-05-07 DIAGNOSIS — I493 Ventricular premature depolarization: Secondary | ICD-10-CM | POA: Diagnosis present

## 2019-05-07 DIAGNOSIS — E785 Hyperlipidemia, unspecified: Secondary | ICD-10-CM | POA: Diagnosis present

## 2019-05-07 LAB — CBC WITH DIFFERENTIAL/PLATELET
Abs Immature Granulocytes: 0.04 10*3/uL (ref 0.00–0.07)
Basophils Absolute: 0 10*3/uL (ref 0.0–0.1)
Basophils Relative: 0 %
Eosinophils Absolute: 0.1 10*3/uL (ref 0.0–0.5)
Eosinophils Relative: 1 %
HCT: 34.4 % — ABNORMAL LOW (ref 36.0–46.0)
Hemoglobin: 11.7 g/dL — ABNORMAL LOW (ref 12.0–15.0)
Immature Granulocytes: 1 %
Lymphocytes Relative: 8 %
Lymphs Abs: 0.6 10*3/uL — ABNORMAL LOW (ref 0.7–4.0)
MCH: 32.1 pg (ref 26.0–34.0)
MCHC: 34 g/dL (ref 30.0–36.0)
MCV: 94.5 fL (ref 80.0–100.0)
Monocytes Absolute: 0.8 10*3/uL (ref 0.1–1.0)
Monocytes Relative: 10 %
Neutro Abs: 6.8 10*3/uL (ref 1.7–7.7)
Neutrophils Relative %: 80 %
Platelets: 194 10*3/uL (ref 150–400)
RBC: 3.64 MIL/uL — ABNORMAL LOW (ref 3.87–5.11)
RDW: 14 % (ref 11.5–15.5)
WBC: 8.3 10*3/uL (ref 4.0–10.5)
nRBC: 0 % (ref 0.0–0.2)

## 2019-05-07 LAB — COMPREHENSIVE METABOLIC PANEL
ALT: 19 U/L (ref 0–44)
AST: 24 U/L (ref 15–41)
Albumin: 3.7 g/dL (ref 3.5–5.0)
Alkaline Phosphatase: 115 U/L (ref 38–126)
Anion gap: 14 (ref 5–15)
BUN: 51 mg/dL — ABNORMAL HIGH (ref 8–23)
CO2: 27 mmol/L (ref 22–32)
Calcium: 9.6 mg/dL (ref 8.9–10.3)
Chloride: 90 mmol/L — ABNORMAL LOW (ref 98–111)
Creatinine, Ser: 2.17 mg/dL — ABNORMAL HIGH (ref 0.44–1.00)
GFR calc Af Amer: 26 mL/min — ABNORMAL LOW (ref >60)
GFR calc non Af Amer: 22 mL/min — ABNORMAL LOW (ref >60)
Glucose, Bld: 123 mg/dL — ABNORMAL HIGH (ref 70–99)
Potassium: 3.3 mmol/L — ABNORMAL LOW (ref 3.5–5.1)
Sodium: 131 mmol/L — ABNORMAL LOW (ref 135–145)
Total Bilirubin: 1.1 mg/dL (ref 0.3–1.2)
Total Protein: 7.3 g/dL (ref 6.5–8.1)

## 2019-05-07 LAB — URINALYSIS, ROUTINE W REFLEX MICROSCOPIC
Bilirubin Urine: NEGATIVE
Glucose, UA: NEGATIVE mg/dL
Hgb urine dipstick: NEGATIVE
Ketones, ur: NEGATIVE mg/dL
Leukocytes,Ua: NEGATIVE
Nitrite: NEGATIVE
Protein, ur: NEGATIVE mg/dL
Specific Gravity, Urine: 1.008 (ref 1.005–1.030)
pH: 6 (ref 5.0–8.0)

## 2019-05-07 LAB — MAGNESIUM: Magnesium: 2.1 mg/dL (ref 1.7–2.4)

## 2019-05-07 LAB — DIGOXIN LEVEL: Digoxin Level: 1.2 ng/mL (ref 0.8–2.0)

## 2019-05-07 LAB — SARS CORONAVIRUS 2 BY RT PCR (HOSPITAL ORDER, PERFORMED IN ~~LOC~~ HOSPITAL LAB): SARS Coronavirus 2: NEGATIVE

## 2019-05-07 LAB — CBG MONITORING, ED: Glucose-Capillary: 149 mg/dL — ABNORMAL HIGH (ref 70–99)

## 2019-05-07 MED ORDER — ONDANSETRON HCL 4 MG/2ML IJ SOLN: 4.0000 mg | Freq: Four times a day (QID) | INTRAMUSCULAR | Status: DC | PRN

## 2019-05-07 MED ORDER — TORSEMIDE 20 MG PO TABS
60.0000 mg | ORAL_TABLET | Freq: Two times a day (BID) | ORAL | Status: DC
  Administered 2019-05-08 – 2019-05-09 (×3): 60 mg via ORAL
  Filled 2019-05-07 (×4): qty 3

## 2019-05-07 MED ORDER — NITROGLYCERIN 0.4 MG SL SUBL: 0.4000 mg | SUBLINGUAL_TABLET | SUBLINGUAL | Status: DC | PRN

## 2019-05-07 MED ORDER — SODIUM CHLORIDE 0.9% FLUSH: 3.0000 mL | INTRAVENOUS | Status: DC | PRN

## 2019-05-07 MED ORDER — SODIUM CHLORIDE 0.9 % IV SOLN: 250.0000 mL | INTRAVENOUS | Status: DC | PRN

## 2019-05-07 MED ORDER — SPIRONOLACTONE 25 MG PO TABS
25.0000 mg | ORAL_TABLET | Freq: Every day | ORAL | Status: DC
  Administered 2019-05-08 – 2019-05-09 (×2): 25 mg via ORAL
  Filled 2019-05-07 (×2): qty 1

## 2019-05-07 MED ORDER — ACETAMINOPHEN 325 MG PO TABS
650.0000 mg | ORAL_TABLET | ORAL | Status: DC | PRN
  Administered 2019-05-08 (×2): 650 mg via ORAL
  Filled 2019-05-07 (×2): qty 2

## 2019-05-07 MED ORDER — SODIUM CHLORIDE 0.9 % IV BOLUS
500.0000 mL | Freq: Once | INTRAVENOUS | Status: AC
  Administered 2019-05-07: 19:00:00 500 mL via INTRAVENOUS

## 2019-05-07 MED ORDER — ROSUVASTATIN CALCIUM 5 MG PO TABS
5.0000 mg | ORAL_TABLET | ORAL | Status: DC
  Administered 2019-05-08: 5 mg via ORAL
  Filled 2019-05-07: qty 1

## 2019-05-07 MED ORDER — MEXILETINE HCL 150 MG PO CAPS
150.0000 mg | ORAL_CAPSULE | Freq: Two times a day (BID) | ORAL | Status: DC
  Administered 2019-05-08 – 2019-05-09 (×3): 150 mg via ORAL
  Filled 2019-05-07 (×5): qty 1

## 2019-05-07 MED ORDER — ASPIRIN 81 MG PO CHEW
81.0000 mg | CHEWABLE_TABLET | Freq: Every day | ORAL | Status: DC
  Administered 2019-05-08 – 2019-05-09 (×2): 81 mg via ORAL
  Filled 2019-05-07 (×3): qty 1

## 2019-05-07 MED ORDER — POTASSIUM CHLORIDE CRYS ER 20 MEQ PO TBCR
40.0000 meq | EXTENDED_RELEASE_TABLET | Freq: Once | ORAL | Status: AC
  Administered 2019-05-07: 19:00:00 40 meq via ORAL
  Filled 2019-05-07: qty 2

## 2019-05-07 MED ORDER — ISOSORBIDE MONONITRATE ER 30 MG PO TB24
30.0000 mg | ORAL_TABLET | Freq: Every day | ORAL | Status: DC
  Administered 2019-05-08 – 2019-05-09 (×2): 30 mg via ORAL
  Filled 2019-05-07 (×3): qty 1

## 2019-05-07 MED ORDER — LOSARTAN POTASSIUM 25 MG PO TABS
12.5000 mg | ORAL_TABLET | Freq: Two times a day (BID) | ORAL | Status: DC
  Administered 2019-05-08 – 2019-05-09 (×3): 12.5 mg via ORAL
  Filled 2019-05-07 (×4): qty 1

## 2019-05-07 MED ORDER — SODIUM CHLORIDE 0.9% FLUSH
3.0000 mL | Freq: Two times a day (BID) | INTRAVENOUS | Status: DC
  Administered 2019-05-08 (×3): 3 mL via INTRAVENOUS

## 2019-05-07 NOTE — ED Notes (Signed)
This RN received call from pt's dtr, Kandi and was able to update her on pt's care. Pt's dtr would like a call when pt's disposition is set.

## 2019-05-07 NOTE — H&P (Signed)
Cardiology Admission History and Physical:   Patient ID: [name]; MRN: [id number]; DOB: 1951/03/04   Admission date: 05/07/2019  Primary Care Provider: Stephens Shire, MD Primary Cardiologist: Jenkins Rouge, MD and Pierre Bali, MD (CHF) Electrophysiologist:  Dr Rayann Heman   Chief Complaint:  Syncope  History of Present Illness:   [name] is a 68 y.o. female with a history of ischemic cardiomyopathy and NYHA Class IIIb CHF (on home milrinone), ICD placement on 01/18/2009 ([employer] single chamber device), CAD (past Anterior MI and POBA of LAD), hypertension, hyperlipidemia, depression and tobacco use who presents to the hospital after a syncopal episode.  The patient was sitting looking at some pictures with her daughter in the afternoon when she had a sudden sensation of profound dizziness and then passed out. The daughter described some generalized shaking/jerking. No incontinence or tongue biting. Other than a mild headache when she came too she did not describe any chest pain, palpitations or shortness of breath. Her torsemide was recently increased (at the recommendation of the heart failure clinic on a tele-visit on 04/27/2019) due to exertional shortness of breath and weight gain. She was also taking her metolazone along with potassium supplementation.  In the ED her ICD interrogation revealed 2 appropriate shocks (1 failed 1 successful) for VT/VF (episode at 15:23, 29 secs, V=273). Device functioning normally. Her serum potassium was 3.3, Magnesium 2.1, & digoxin level 1.2.   Cardiac testing: CPX 9/19 severe HF limitation with pVO2 14.2 and slope 40   MRI (12/09):  High scar burden, Inf and Apical AK, EF 24%  RHC on 12/22/18 notable for low output with MV sat 47% and CI 1.7. PCWP 20. She was admitted and started on milrinone. During that admit also underwent PFTs with further decrease in DLCO.  Echo 01/20/19: EF 15-20%, RV normal, mod MR    Past Medical History:    Diagnosis Date   Arthritis    hands   Asthma    CAD (coronary artery disease)    a. s/p prior Ant MI, b. s/p prior POBA to LAD, Dx, RCA,;  c.  LHC (10/12):  dLAD 40, pCFX 30, OM1 50, pRCA 30;  d.  [facility] Myoview (11/14):  High risk study; inferior, anterior, apical defects; minimal reversibility toward the apex; consistent with prior infarct and very mild peri-infarct ischemia, EF 24%, inferior/apical/anterior akinesis   CHF (congestive heart failure) (HCC)    Chronic systolic heart failure (HCC)    CKD (chronic kidney disease), stage III ([place])    Depression    H/O hiatal hernia    HLD (hyperlipidemia)    HTN (hypertension)    Ischemic cardiomyopathy    MRI (12/09):  High scar burden, Inf and Apical AK, EF 24%.;  Echo (10/14): EF 15%, Gr 2 DD, mild to mod MR, mod LAE, RVSF mildly reduced, mod RAE, severe TR, PASP 49-53 (mod pulmonary HTN)   MI (myocardial infarction) (HCC)    Tobacco abuse     Past Surgical History:  Procedure Laterality Date   ABDOMINAL AORTOGRAM W/LOWER EXTREMITY N/A 03/02/2018   Procedure: ABDOMINAL AORTOGRAM W/LOWER EXTREMITY;  Surgeon: Wellington Hampshire, MD;  Location: [facility];  Service: Cardiovascular;  Laterality: N/A;   CARDIAC DEFIBRILLATOR PLACEMENT  01/18/2009   MDT single chamber ICD implanted by Dr Rayann Heman    cardiac stents     COLONOSCOPY  03/18/2012   Procedure: COLONOSCOPY;  Surgeon: Beryle Beams, MD;  Location: [facility];  Service: Endoscopy;  Laterality:  N/A;   CORONARY ANGIOPLASTY     ESOPHAGOGASTRODUODENOSCOPY N/A 03/10/2013   Procedure: ESOPHAGOGASTRODUODENOSCOPY (EGD);  Surgeon: Beryle Beams, MD;  Location: [facility];  Service: Endoscopy;  Laterality: N/A;   IR FLUORO GUIDE CV LINE RIGHT  12/23/2018   IR US GUIDE VASC ACCESS RIGHT  12/23/2018   LEFT HEART CATHETERIZATION WITH CORONARY ANGIOGRAM N/A 07/26/2014   Procedure: LEFT HEART CATHETERIZATION WITH CORONARY ANGIOGRAM;  Surgeon: Jolaine Artist, MD;   Location: [facility];  Service: Cardiovascular;  Laterality: N/A;   RIGHT HEART CATH N/A 12/22/2018   Procedure: RIGHT HEART CATH;  Surgeon: Jolaine Artist, MD;  Location: [facility];  Service: Cardiovascular;  Laterality: N/A;   RIGHT/LEFT HEART CATH AND CORONARY ANGIOGRAPHY N/A 06/22/2018   Procedure: RIGHT/LEFT HEART CATH AND CORONARY ANGIOGRAPHY;  Surgeon: Larey Dresser, MD;  Location: [facility];  Service: Cardiovascular;  Laterality: N/A;     Medications Prior to Admission: Prior to Admission medications   Medication Sig Start Date End Date Taking? Authorizing Provider  acetaminophen (TYLENOL ARTHRITIS PAIN) 650 MG CR tablet Take 650 mg by mouth every 8 (eight) hours as needed for pain.     [provider]  albuterol (PROAIR HFA) 108 (90 Base) MCG/ACT inhaler Inhale 2 puffs into the lungs every 6 (six) hours as needed for wheezing.  01/12/18   [provider]  aspirin 81 MG tablet Take 81 mg by mouth daily.      [provider]  azelastine (ASTELIN) 0.1 % nasal spray Place 1 spray into both nostrils daily.     [provider]  budesonide-formoterol (SYMBICORT) 160-4.5 MCG/ACT inhaler Inhale 2 puffs into the lungs daily.  10/14/17   [provider]  citalopram (CELEXA) 20 MG tablet Take 20 mg by mouth daily. 12/16/17   [provider]  clonazePAM (KLONOPIN) 0.5 MG tablet Take 0.25-0.5 mg by mouth 2 (two) times daily as needed. 11/07/18   [provider]  digoxin (LANOXIN) 0.125 MG tablet TAKE 1/2 TABLET BY MOUTH EVERY DAY Patient taking differently: Take 0.0625 mg by mouth daily.  08/18/18   Shirley Friar, PA-C  ezetimibe (ZETIA) 10 MG tablet TAKE 1/2 TABLET TWICE A WEEK Patient taking differently: Take 5 mg by mouth 2 (two) times a week. Saturday and Sunday 11/07/18   Bensimhon, Shaune Pascal, MD  fish oil-omega-3 fatty acids 1000 MG capsule Take 2 g by mouth daily.     [provider]   fluticasone (FLONASE) 50 MCG/ACT nasal spray Place 2 sprays into both nostrils daily.    [provider]  isosorbide mononitrate (IMDUR) 30 MG 24 hr tablet TAKE 1 TABLET BY MOUTH EVERY DAY Patient taking differently: Take 30 mg by mouth daily.  08/02/18   Clegg, Amy D, NP  loratadine (CLARITIN) 10 MG tablet Take 10 mg by mouth daily.    [provider]  losartan (COZAAR) 25 MG tablet TAKE 0.5 TABLETS (12.5 MG TOTAL) BY MOUTH 2 (TWO) TIMES DAILY. 07/27/18   Bensimhon, Shaune Pascal, MD  metolazone (ZAROXOLYN) 2.5 MG tablet Take 1 tablet (2.5 mg total) by mouth as needed for up to 10 days. Patient taking differently: Take 2.5 mg by mouth as needed. Taking once weekly 01/20/19 04/27/19  Bensimhon, Shaune Pascal, MD  mexiletine (MEXITIL) 150 MG capsule Take 1 capsule (150 mg total) by mouth 2 (two) times daily. 07/06/18   Clegg, Amy D, NP  milrinone (PRIMACOR) 20 MG/100 ML SOLN infusion Inject 0.013  mg/min into the vein continuous. 12/23/18   Georgiana Shore, NP  nitroGLYCERIN (NITROSTAT) 0.4 MG SL tablet Place 1 tablet (0.4 mg total) under the tongue every 5 (five) minutes as needed for chest pain. 10/28/16   Josue Hector, MD  omeprazole (PRILOSEC) 40 MG capsule Take 40 mg by mouth daily.     [provider]  polyethylene glycol (MIRALAX / GLYCOLAX) packet Take 17 g by mouth at bedtime.    [provider]  potassium chloride SA (K-DUR,KLOR-CON) 20 MEQ tablet Take 2 tablets (40 mEq total) by mouth as needed. Take when taking Metolazone 01/20/19   Bensimhon, Shaune Pascal, MD  rosuvastatin (CRESTOR) 5 MG tablet Take 1 tablet by mouth on Monday, Wednesday, and Friday Patient taking differently: Take 5 mg by mouth 3 (three) times a week. Monday, Wednesday, and Friday 04/29/18   Wellington Hampshire, MD  spironolactone (ALDACTONE) 25 MG tablet TAKE 1 TABLET BY MOUTH EVERY DAY 02/02/19   Bensimhon, Shaune Pascal, MD  torsemide (DEMADEX) 20 MG tablet Taking 60 mg BID 05/02/19   Larey Dresser, MD      Allergies:    Allergies  Allergen Reactions   Prednisone Shortness Of Breath, Swelling, Rash and Other (See Comments)    Sore throat, also   Zebeta [Bisoprolol Fumarate] Other (See Comments)    Caused confusion   Eggs Or Egg-Derived Products Other (See Comments)    Was told she is allergic to EGG WHITES   Penicillins Rash    itching   Statins Other (See Comments)    Gradually tired with daily Lipitor, made pt feel badly (Pt can take Crestor 5 mg 4 times per week and Zetia 5 mg two times a week)    Social History:   Social History   Socioeconomic History   Marital status: Divorced    Spouse name: Not on file   Number of children: 2   Years of education: Not on file   Highest education level: Not on file  Occupational History   Occupation: Works for [employer]: [employer]  Social Needs   Emergency planning/management officer strain: Not on file   Food insecurity:    Worry: Not on file    Inability: Not on file   Transportation needs:    Medical: Not on file    Non-medical: Not on file  Tobacco Use   Smoking status: Current Every Day Smoker    Years: 40.00    Types: Cigarettes   Smokeless tobacco: Never Used   Tobacco comment: I am cutting back  Substance and Sexual Activity   Alcohol use: No   Drug use: No   Sexual activity: Not Currently  Lifestyle   Physical activity:    Days per week: Not on file    Minutes per session: Not on file   Stress: Not on file  Relationships   Social connections:    Talks on phone: Not on file    Gets together: Not on file    Attends religious service: Not on file    Active member of club or organization: Not on file    Attends meetings of clubs or organizations: Not on file    Relationship status: Not on file   Intimate partner violence:    Fear of current or ex partner: Not on file    Emotionally abused: Not on file    Physically abused: Not on file    Forced sexual activity: Not on file  Other  Topics Concern    Not on file  Social History Narrative   Lives in [place]     Family History:  The patient's family history includes Breast cancer in her sister; Diabetes Mellitus II in her sister; Stroke in her mother.     Review of Systems: [y] = yes, [ ]  = no    General: Weight gain [ ] ; Weight loss [ ] ; Anorexia [ ] ; Fatigue [ ] ; Fever [ ] ; Chills [ ] ; Weakness [Y ]   Cardiac: Chest pain/pressure [ ] ; Resting SOB [ ] ; Exertional SOB [Y ]; Orthopnea [ ] ; Pedal Edema [ ] ; Palpitations [ ] ; Syncope [ ] ; Presyncope [ ] ; Paroxysmal nocturnal dyspnea[ ]    Pulmonary: Cough [ ] ; Wheezing[ ] ; Hemoptysis[ ] ; Sputum [ ] ; Snoring [ ]    GI: Vomiting[ ] ; Dysphagia[ ] ; Melena[ ] ; Hematochezia [ ] ; Heartburn[ ] ; Abdominal pain [ ] ; Constipation [ ] ; Diarrhea [ ] ; BRBPR [ ]    GU: Hematuria[ ] ; Dysuria [ ] ; Nocturia[ ]    Vascular: Pain in legs with walking [ ] ; Pain in feet with lying flat [ ] ; Non-healing sores [ ] ; Stroke [ ] ; TIA [ ] ; Slurred speech [ ] ;   Neuro: Headaches[ ] ; Vertigo[ ] ; Seizures[ ] ; Paresthesias[ ] ;Blurred vision [ ] ; Diplopia [ ] ; Vision changes [ ]    Ortho/Skin: Arthritis [ ] ; Joint pain [ ] ; Muscle pain [ ] ; Joint swelling [ ] ; Back Pain [ ] ; Rash [ ]    Psych: Depression[ ] ; Anxiety[ ]    Heme: Bleeding problems [ ] ; Clotting disorders [ ] ; Anemia [ ]    Endocrine: Diabetes [ ] ; Thyroid dysfunction[ ]      Physical Exam/Data:   Vitals:   05/07/19 1915 05/07/19 1930 05/07/19 2006 05/07/19 2033  BP: 99/60 112/65 101/60 (!) 100/59  Pulse: 93 92 99 97  Resp: 18  19 18   Temp:      TempSrc:      SpO2: 96% 96% 97% 97%    Intake/Output Summary (Last 24 hours) at 05/07/2019 2104 Last data filed at 05/07/2019 2055 Gross per 24 hour  Intake 500 ml  Output --  Net 500 ml   There were no vitals filed for this visit. There is no height or weight on file to calculate BMI.  General:  Well nourished, well developed, in no acute distress HEENT: normal Lymph: no adenopathy Neck:  no JVD Endocrine:  No thryomegaly Vascular: No carotid bruits; FA pulses 2+ bilaterally without bruits  Cardiac:  normal S1, S2; RRR; no murmur  Lungs:  clear to auscultation bilaterally, no wheezing, rhonchi or rales  Abd: soft, nontender, no hepatomegaly  Ext: no edema Musculoskeletal:  No deformities, BUE and BLE strength normal and equal Skin: warm and dry  Neuro:  CNs 2-12 intact, no focal abnormalities noted Psych:  Normal affect    EKG:  The ECG that was done today was personally reviewed and demonstrates sinus rhythm, LBBB and occasional PVCs.  Laboratory Data:  Chemistry Recent Labs  Lab 05/07/19 1738  NA 131*  K 3.3*  CL 90*  CO2 27  GLUCOSE 123*  BUN 51*  CREATININE 2.17*  CALCIUM 9.6  GFRNONAA 22*  GFRAA 26*  ANIONGAP 14    Recent Labs  Lab 05/07/19 1738  PROT 7.3  ALBUMIN 3.7  AST 24  ALT 19  ALKPHOS 115  BILITOT 1.1   Hematology Recent Labs  Lab 05/07/19 1738  WBC 8.3  RBC 3.64*  HGB 11.7*  HCT 34.4*  MCV 94.5  MCH 32.1  MCHC 34.0  RDW 14.0  PLT 194   Cardiac EnzymesNo results for input(s): TROPONINI in the last 168 hours. No results for input(s): TROPIPOC in the last 168 hours.  BNPNo results for input(s): BNP, PROBNP in the last 168 hours.  DDimer No results for input(s): DDIMER in the last 168 hours.  Radiology/Studies:  Dg Chest Portable 1 View  Result Date: 05/07/2019 CLINICAL DATA:  Syncope EXAM: PORTABLE CHEST 1 VIEW COMPARISON:  06/15/2018 FINDINGS: Left AICD remains in place, unchanged. Right internal jugular PICC line tip is in the lower SVC. Cardiomegaly. Lungs clear. No effusions or edema. No acute bony abnormality. IMPRESSION: Cardiomegaly.  No active disease. Electronically Signed   By: Rolm Baptise M.D.   On: 05/07/2019 18:16    Assessment and Plan:   1. Syncope  The patient had a syncopal episode today and her ICD interrogation reveals appropriate response from the device for VT/VF. She has a h/o ischemic  cardiomyopathy and is on diuretic therapy for her heart failure. Her regimen was modified recently and today she is noted to be hypokalemic (K = 3.3) with a slightly elevated Dig level of 1.2. She had also been retaining fluid till a few days ago (but appears euvolemic on exam today)  - Admit to telemetry for overnight observation - Replace potassium IV/PO - Maintain K >4.0 and Mg >2.0 - Hold Digoxin for now - Continue other home medications as such - Check a BNP level. Cardiac biomarkers are expected to be elevated due to ICD therapy - Will need adjustment of diuretic therapy to prevent future hypokalemia   Severity of Illness: The appropriate patient status for this patient is OBSERVATION. Observation status is judged to be reasonable and necessary in order to provide the required intensity of service to ensure the patient's safety. The patient's presenting symptoms, physical exam findings, and initial radiographic and laboratory data in the context of their medical condition is felt to place them at decreased risk for further clinical deterioration. Furthermore, it is anticipated that the patient will be medically stable for discharge from the hospital within 2 midnights of admission. The following factors support the patient status of observation.   " The patient's presenting symptoms include syncope . " The physical exam findings include normal cardiac auscultation. " The initial radiographic and laboratory data show appropriate ICD shocks.     For questions or updates, please contact [place] Please consult [email or web address] for contact info under Cardiology/STEMI.    Signed, Meade Maw, MD  05/07/2019 9:04 PM

## 2019-05-07 NOTE — ED Notes (Signed)
This RN acting as Art therapist and asked pt if she would like for me to call any family/friends for her. Pt requested her dtr, [name] be called. I called the number provided for her dtr, but was not able to reach anyone. Will attempt to call again.

## 2019-05-07 NOTE — ED Provider Notes (Signed)
[facility] EMERGENCY DEPARTMENT Provider Note   CSN: [id number] Arrival date & time: 05/07/19  1627    History   Chief Complaint Chief Complaint  Patient presents with  . Loss of Consciousness    HPI [name] is a 68 y.o. female.     HPI  68 year old female presents after syncope.  She was sitting down with family looking at photos and all of a sudden felt lightheaded.  Shortly thereafter she passed out.  Daughter reported that she was having some shaking/jerking.  Patient states she had a minimal headache when she awoke but otherwise feels fine.  No headache now.  Never had chest pain or shortness of breath or palpitations.  The patient recently had her torsemide increased from 3 tablets in the morning and 2 at night to 4 in the morning and 3 at night.  This occurred about 2 weeks ago.  She denies feeling ill prior to today.  No vomiting or diarrhea or fever.  Past Medical History:  Diagnosis Date  . Arthritis    hands  . Asthma   . CAD (coronary artery disease)    a. s/p prior Ant MI, b. s/p prior POBA to LAD, Dx, RCA,;  c.  LHC (10/12):  dLAD 40, pCFX 30, OM1 50, pRCA 30;  d.  [facility] Adam Myoview (11/14):  High risk study; inferior, anterior, apical defects; minimal reversibility toward the apex; consistent with prior infarct and very mild peri-infarct ischemia, EF 24%, inferior/apical/anterior akinesis  . CHF (congestive heart failure) ([place])   . Chronic systolic heart failure ([place])   . CKD (chronic kidney disease), stage III ([place])   . Depression   . H/O hiatal hernia   . HLD (hyperlipidemia)   . HTN (hypertension)   . Ischemic cardiomyopathy    MRI (12/09):  High scar burden, Inf and Apical AK, EF 24%.;  Echo (10/14): EF 15%, Gr 2 DD, mild to mod MR, mod LAE, RVSF mildly reduced, mod RAE, severe TR, PASP 49-53 (mod pulmonary HTN)  . MI (myocardial infarction) ([place])   . Tobacco abuse     Patient Active Problem List   Diagnosis Date Noted  . Acute  on chronic systolic (congestive) heart failure ([place]) 12/22/2018  . Pressure injury of skin 12/22/2018  . AKI (acute kidney injury) ([place])   . Abdominal pain 06/16/2018  . Hypokalemia 06/16/2018  . Rectal bleeding 06/16/2018  . Elevated troponin 06/16/2018  . Iron deficiency anemia 06/16/2018  . Abnormal LFTs 06/16/2018  . Abdominal pain, RUQ   . Hyponatremia 06/08/2018  . Acute CHF (congestive heart failure) ([place]) 06/04/2018  . Acute on chronic systolic CHF (congestive heart failure) ([place]) 06/04/2018  . Dizziness 07/30/2017  . Generalized anxiety disorder 04/08/2016  . Tenosynovitis, de Quervain 04/08/2016  . Acute renal failure superimposed on stage 3 chronic kidney disease ([place]) 03/31/2016  . Allergic rhinitis 02/06/2016  . Disease of nasal cavity and sinuses 02/06/2016  . Cardiomyopathy ([place]) 02/06/2016  . Asthma 02/06/2016  . Diaphragmatic hernia 02/06/2016  . Dyslipidemia 02/06/2016  . GERD (gastroesophageal reflux disease) 02/06/2016  . Primary snoring 02/06/2016  . CAD (coronary artery disease) 02/06/2016  . Intermittent claudication ([place]) 08/07/2014  . Bilateral carotid artery stenosis 08/07/2014  . Stenosis of carotid artery 08/07/2014  . Precordial chest pain 07/25/2014  . Chronic systolic heart failure ([place]) 10/10/2013  . Chronic obstructive pulmonary disease ([place]) 10/10/2013  . Hyperlipidemia 06/22/2013  . Leg pain 06/05/2013  . Implantable cardioverter-defibrillator (ICD) in situ  09/17/2011  . Congestive heart failure ([place]) 08/14/2011  . Tobacco abuse 01/30/2011  . Other symptoms involving cardiovascular system 01/30/2011  . Depression 05/01/2009  . Essential hypertension 05/01/2009  . MYOCARDIAL INFARCTION 05/01/2009  . Coronary atherosclerosis 05/01/2009  . CARDIOMYOPATHY, ISCHEMIC 05/01/2009  . Chronic ischemic heart disease 05/01/2009    Past Surgical History:  Procedure Laterality Date  . ABDOMINAL AORTOGRAM W/LOWER EXTREMITY N/A 03/02/2018    Procedure: ABDOMINAL AORTOGRAM W/LOWER EXTREMITY;  Surgeon: Wellington Hampshire, MD;  Location: [facility];  Service: Cardiovascular;  Laterality: N/A;  . CARDIAC DEFIBRILLATOR PLACEMENT  01/18/2009   MDT single chamber ICD implanted by Dr Rayann Heman   . cardiac stents    . COLONOSCOPY  03/18/2012   Procedure: COLONOSCOPY;  Surgeon: Beryle Beams, MD;  Location: [facility];  Service: Endoscopy;  Laterality: N/A;  . CORONARY ANGIOPLASTY    . ESOPHAGOGASTRODUODENOSCOPY N/A 03/10/2013   Procedure: ESOPHAGOGASTRODUODENOSCOPY (EGD);  Surgeon: Beryle Beams, MD;  Location: [facility];  Service: Endoscopy;  Laterality: N/A;  . IR FLUORO GUIDE CV LINE RIGHT  12/23/2018  . IR US GUIDE VASC ACCESS RIGHT  12/23/2018  . LEFT HEART CATHETERIZATION WITH CORONARY ANGIOGRAM N/A 07/26/2014   Procedure: LEFT HEART CATHETERIZATION WITH CORONARY ANGIOGRAM;  Surgeon: Jolaine Artist, MD;  Location: [facility] CATH LAB;  Service: Cardiovascular;  Laterality: N/A;  . RIGHT HEART CATH N/A 12/22/2018   Procedure: RIGHT HEART CATH;  Surgeon: Jolaine Artist, MD;  Location: [facility];  Service: Cardiovascular;  Laterality: N/A;  . RIGHT/LEFT HEART CATH AND CORONARY ANGIOGRAPHY N/A 06/22/2018   Procedure: RIGHT/LEFT HEART CATH AND CORONARY ANGIOGRAPHY;  Surgeon: Larey Dresser, MD;  Location: [facility];  Service: Cardiovascular;  Laterality: N/A;     OB History   No obstetric history on file.      Home Medications    Prior to Admission medications   Medication Sig Start Date End Date Taking? Authorizing Provider  acetaminophen (TYLENOL ARTHRITIS PAIN) 650 MG CR tablet Take 650 mg by mouth every 8 (eight) hours as needed for pain.     [provider]  albuterol (PROAIR HFA) 108 (90 Base) MCG/ACT inhaler Inhale 2 puffs into the lungs every 6 (six) hours as needed for wheezing.  01/12/18   [provider]  aspirin 81 MG tablet Take 81 mg by mouth daily.      [provider]  azelastine (ASTELIN) 0.1 % nasal spray Place 1 spray into both nostrils daily.     [provider]  budesonide-formoterol (SYMBICORT) 160-4.5 MCG/ACT inhaler Inhale 2 puffs into the lungs daily.  10/14/17   [provider]  citalopram (CELEXA) 20 MG tablet Take 20 mg by mouth daily. 12/16/17   [provider]  clonazePAM (KLONOPIN) 0.5 MG tablet Take 0.25-0.5 mg by mouth 2 (two) times daily as needed. 11/07/18   [provider]  digoxin (LANOXIN) 0.125 MG tablet TAKE 1/2 TABLET BY MOUTH EVERY DAY Patient taking differently: Take 0.0625 mg by mouth daily.  08/18/18   Shirley Friar, PA-C  ezetimibe (ZETIA) 10 MG tablet TAKE 1/2 TABLET TWICE A WEEK Patient taking differently: Take 5 mg by mouth 2 (two) times a week. Saturday and Sunday 11/07/18   Bensimhon, Shaune Pascal, MD  fish oil-omega-3 fatty acids 1000 MG capsule Take 2 g by mouth daily.     [provider]  fluticasone (FLONASE) 50 MCG/ACT nasal spray Place 2 sprays into both nostrils daily.  [provider]  isosorbide mononitrate (IMDUR) 30 MG 24 hr tablet TAKE 1 TABLET BY MOUTH EVERY DAY Patient taking differently: Take 30 mg by mouth daily.  08/02/18   Clegg, Amy D, NP  loratadine (CLARITIN) 10 MG tablet Take 10 mg by mouth daily.    [provider]  losartan (COZAAR) 25 MG tablet TAKE 0.5 TABLETS (12.5 MG TOTAL) BY MOUTH 2 (TWO) TIMES DAILY. 07/27/18   Bensimhon, Shaune Pascal, MD  metolazone (ZAROXOLYN) 2.5 MG tablet Take 1 tablet (2.5 mg total) by mouth as needed for up to 10 days. Patient taking differently: Take 2.5 mg by mouth as needed. Taking once weekly 01/20/19 04/27/19  Bensimhon, Shaune Pascal, MD  mexiletine (MEXITIL) 150 MG capsule Take 1 capsule (150 mg total) by mouth 2 (two) times daily. 07/06/18   Clegg, Amy D, NP  milrinone (PRIMACOR) 20 MG/100 ML SOLN infusion Inject 0.013 mg/min into the vein continuous. 12/23/18   Georgiana Shore, NP  nitroGLYCERIN (NITROSTAT) 0.4  MG SL tablet Place 1 tablet (0.4 mg total) under the tongue every 5 (five) minutes as needed for chest pain. 10/28/16   Josue Hector, MD  omeprazole (PRILOSEC) 40 MG capsule Take 40 mg by mouth daily.     [provider]  polyethylene glycol (MIRALAX / GLYCOLAX) packet Take 17 g by mouth at bedtime.    [provider]  potassium chloride SA (K-DUR,KLOR-CON) 20 MEQ tablet Take 2 tablets (40 mEq total) by mouth as needed. Take when taking Metolazone 01/20/19   Bensimhon, Shaune Pascal, MD  rosuvastatin (CRESTOR) 5 MG tablet Take 1 tablet by mouth on Monday, Wednesday, and Friday Patient taking differently: Take 5 mg by mouth 3 (three) times a week. Monday, Wednesday, and Friday 04/29/18   Wellington Hampshire, MD  spironolactone (ALDACTONE) 25 MG tablet TAKE 1 TABLET BY MOUTH EVERY DAY 02/02/19   Bensimhon, Shaune Pascal, MD  torsemide (DEMADEX) 20 MG tablet Taking 60 mg BID 05/02/19   Larey Dresser, MD    Family History Family History  Problem Relation Age of Onset  . Stroke Mother   . Diabetes Mellitus II Sister   . Breast cancer Sister     Social History Social History   Tobacco Use  . Smoking status: Current Every Day Smoker    Years: 40.00    Types: Cigarettes  . Smokeless tobacco: Never Used  . Tobacco comment: I am cutting back  Substance Use Topics  . Alcohol use: No  . Drug use: No     Allergies   Prednisone; Zebeta [bisoprolol fumarate]; Eggs or egg-derived products; Penicillins; and Statins   Review of Systems Review of Systems  Constitutional: Negative for fever.  Respiratory: Negative for shortness of breath.   Cardiovascular: Negative for chest pain and palpitations.  Neurological: Positive for syncope and light-headedness. Negative for weakness.  All other systems reviewed and are negative.    Physical Exam Updated Vital Signs BP (!) 104/50 (BP Location: Right Arm)   Pulse 95   Temp 98.3 F (36.8 C) (Oral)   Resp 19   SpO2 97%   Physical  Exam Vitals signs and nursing note reviewed.  Constitutional:      Appearance: She is well-developed.  HENT:     Head: Normocephalic and atraumatic.     Right Ear: External ear normal.     Left Ear: External ear normal.     Nose: Nose normal.  Eyes:     General:  Right eye: No discharge.        Left eye: No discharge.  Cardiovascular:     Rate and Rhythm: Normal rate and regular rhythm.     Heart sounds: Normal heart sounds.  Pulmonary:     Effort: Pulmonary effort is normal.     Breath sounds: Normal breath sounds.  Abdominal:     Palpations: Abdomen is soft.     Tenderness: There is no abdominal tenderness.  Skin:    General: Skin is warm and dry.  Neurological:     Mental Status: She is alert.     Comments: CN 3-12 grossly intact. 5/5 strength in all 4 extremities. Grossly normal sensation. Normal finger to nose.  Psychiatric:        Mood and Affect: Mood is not anxious.      ED Treatments / Results  Labs (all labs ordered are listed, but only abnormal results are displayed) Labs Reviewed  CBC WITH DIFFERENTIAL/PLATELET - Abnormal; Notable for the following components:      Result Value   RBC 3.64 (*)    Hemoglobin 11.7 (*)    HCT 34.4 (*)    Lymphs Abs 0.6 (*)    All other components within normal limits  COMPREHENSIVE METABOLIC PANEL - Abnormal; Notable for the following components:   Sodium 131 (*)    Potassium 3.3 (*)    Chloride 90 (*)    Glucose, Bld 123 (*)    BUN 51 (*)    Creatinine, Ser 2.17 (*)    GFR calc non Af Amer 22 (*)    GFR calc Af Amer 26 (*)    All other components within normal limits  URINALYSIS, ROUTINE W REFLEX MICROSCOPIC - Abnormal; Notable for the following components:   Color, Urine STRAW (*)    All other components within normal limits  MAGNESIUM  CBG MONITORING, ED    EKG EKG Interpretation  Date/Time:  Sunday May 07 2019 16:39:08 EDT Ventricular Rate:  90 PR Interval:    QRS Duration: 157 QT Interval:  425  QTC Calculation: 521 R Axis:   -76 Text Interpretation:  Sinus rhythm Multiple premature complexes, vent & supraven Probable left atrial enlargement Left bundle branch block no significant change since Jan 2020 Confirmed by Sherwood Gambler [phone]) on 05/07/2019 4:45:45 PM   Radiology Dg Chest Portable 1 View  Result Date: 05/07/2019 CLINICAL DATA:  Syncope EXAM: PORTABLE CHEST 1 VIEW COMPARISON:  06/15/2018 FINDINGS: Left AICD remains in place, unchanged. Right internal jugular PICC line tip is in the lower SVC. Cardiomegaly. Lungs clear. No effusions or edema. No acute bony abnormality. IMPRESSION: Cardiomegaly.  No active disease. Electronically Signed   By: Rolm Baptise M.D.   On: 05/07/2019 18:16    Procedures Procedures (including critical care time)  Medications Ordered in ED Medications - No data to display   Initial Impression / Assessment and Plan / ED Course  I have reviewed the triage vital signs and the nursing notes.  Pertinent labs & imaging results that were available during my care of the patient were reviewed by me and considered in my medical decision making (see chart for details).        Pacemaker interrogation reveals brief ventricular fibrillation episode that was shocked twice by her ICD.  She has been stable here with on and off ventricular beats but no V. tach or V. fib.  Hypokalemia repleted orally.  She has an AKI and was given a small bolus of IV  fluids.  Cardiology consulted and will admit.  Final Clinical Impressions(s) / ED Diagnoses   Final diagnoses:  Ventricular fibrillation [facility])    ED Discharge Orders    None       Sherwood Gambler, MD 05/08/19 [phone]

## 2019-05-07 NOTE — ED Triage Notes (Signed)
Pt arrives from home by [facility] with complaints of a syncpole episoide lasting 30-45 secs witnessed by daughter. History of CHF. PT given 300 NS enroute and 4mg  of zofran.   BP 84/40 RR16 96%RA 97.3 temp

## 2019-05-07 NOTE — ED Notes (Signed)
ED TO INPATIENT HANDOFF REPORT  ED Nurse Name and Phone #:  [name] "[place]" [phone]  S Name/Age/Gender [name] 68 y.o. female Room/Bed: 035C/035C  Code Status   Code Status: Prior  Home/SNF/Other Home Patient oriented to: self, place, time and situation Is this baseline? Yes   Triage Complete: Triage complete  Chief Complaint syncope  Triage Note Pt arrives from home by [facility] with complaints of a syncpole episoide lasting 30-45 secs witnessed by daughter. History of CHF. PT given 300 NS enroute and 4mg  of zofran.   BP 84/40 RR16 96%RA 97.3 temp   Allergies Allergies  Allergen Reactions  . Prednisone Shortness Of Breath, Swelling, Rash and Other (See Comments)    Sore throat, also  . Zebeta [Bisoprolol Fumarate] Other (See Comments)    Caused confusion  . Eggs Or Egg-Derived Products Other (See Comments)    Was told she is allergic to EGG WHITES  . Penicillins Itching and Rash    Did it involve swelling of the face/tongue/throat, SOB, or low BP? No Did it involve sudden or severe rash/hives, skin peeling, or any reaction on the inside of your mouth or nose? No Did you need to seek medical attention at a hospital or doctor's office? No When did it last happen?68 years old If all above answers are "NO", may proceed with cephalosporin use.  . Statins Other (See Comments)    Gradually tired with daily Lipitor, made pt feel badly (Pt can take Crestor 5 mg 4 times per week and Zetia 5 mg two times a week)    Level of Care/Admitting Diagnosis ED Disposition    ED Disposition Condition [facility] Area: [place] [[id number]]  Level of Care: Telemetry Cardiac [103]  Covid Evaluation: N/A  Diagnosis: Syncope [194174]  Admitting Physician: Meade Maw [[id number]]  Attending Physician: Jolaine Artist [[id number]]  PT Class (Do Not Modify): Observation [104]  PT Acc Code (Do Not Modify): Observation [10022]        B Medical/Surgery History Past Medical History:  Diagnosis Date  . Arthritis    hands  . Asthma   . CAD (coronary artery disease)    a. s/p prior Ant MI, b. s/p prior POBA to LAD, Dx, RCA,;  c.  LHC (10/12):  dLAD 40, pCFX 30, OM1 50, pRCA 30;  d.  [facility] Adam Myoview (11/14):  High risk study; inferior, anterior, apical defects; minimal reversibility toward the apex; consistent with prior infarct and very mild peri-infarct ischemia, EF 24%, inferior/apical/anterior akinesis  . CHF (congestive heart failure) ([place])   . Chronic systolic heart failure ([place])   . CKD (chronic kidney disease), stage III ([place])   . Depression   . H/O hiatal hernia   . HLD (hyperlipidemia)   . HTN (hypertension)   . Ischemic cardiomyopathy    MRI (12/09):  High scar burden, Inf and Apical AK, EF 24%.;  Echo (10/14): EF 15%, Gr 2 DD, mild to mod MR, mod LAE, RVSF mildly reduced, mod RAE, severe TR, PASP 49-53 (mod pulmonary HTN)  . MI (myocardial infarction) ([place])   . Tobacco abuse    Past Surgical History:  Procedure Laterality Date  . ABDOMINAL AORTOGRAM W/LOWER EXTREMITY N/A 03/02/2018   Procedure: ABDOMINAL AORTOGRAM W/LOWER EXTREMITY;  Surgeon: Wellington Hampshire, MD;  Location: [facility];  Service: Cardiovascular;  Laterality: N/A;  . CARDIAC DEFIBRILLATOR PLACEMENT  01/18/2009   MDT single chamber ICD implanted by Dr Rayann Heman   .  cardiac stents    . COLONOSCOPY  03/18/2012   Procedure: COLONOSCOPY;  Surgeon: Beryle Beams, MD;  Location: [facility]OSCOPY;  Service: Endoscopy;  Laterality: N/A;  . CORONARY ANGIOPLASTY    . ESOPHAGOGASTRODUODENOSCOPY N/A 03/10/2013   Procedure: ESOPHAGOGASTRODUODENOSCOPY (EGD);  Surgeon: Beryle Beams, MD;  Location: [facility];  Service: Endoscopy;  Laterality: N/A;  . IR FLUORO GUIDE CV LINE RIGHT  12/23/2018  . IR US GUIDE VASC ACCESS RIGHT  12/23/2018  . LEFT HEART CATHETERIZATION WITH CORONARY ANGIOGRAM N/A 07/26/2014   Procedure: LEFT HEART CATHETERIZATION WITH  CORONARY ANGIOGRAM;  Surgeon: Jolaine Artist, MD;  Location: [facility] CATH LAB;  Service: Cardiovascular;  Laterality: N/A;  . RIGHT HEART CATH N/A 12/22/2018   Procedure: RIGHT HEART CATH;  Surgeon: Jolaine Artist, MD;  Location: [facility];  Service: Cardiovascular;  Laterality: N/A;  . RIGHT/LEFT HEART CATH AND CORONARY ANGIOGRAPHY N/A 06/22/2018   Procedure: RIGHT/LEFT HEART CATH AND CORONARY ANGIOGRAPHY;  Surgeon: Larey Dresser, MD;  Location: [facility];  Service: Cardiovascular;  Laterality: N/A;     A IV Location/Drains/Wounds Patient Lines/Drains/Airways Status   Active Line/Drains/Airways    Name:   Placement date:   Placement time:   Site:   Days:   Peripheral IV 05/07/19 Left Antecubital   05/07/19    1925    Antecubital   less than 1   Tunneled CVC Single Lumen (Radiology) 12/23/18 Right Internal jugular 24 cm   12/23/18    1643    Internal jugular   135   Pressure Injury 12/22/18 Stage I -  Intact skin with non-blanchable redness of a localized area usually over a bony prominence.   12/22/18    1202     136          Intake/Output Last 24 hours  Intake/Output Summary (Last 24 hours) at 05/07/2019 2223 Last data filed at 05/07/2019 2055 Gross per 24 hour  Intake 500 ml  Output -  Net 500 ml    Labs/Imaging Results for orders placed or performed during the hospital encounter of 05/07/19 (from the past 48 hour(s))  Urinalysis, Routine w reflex microscopic     Status: Abnormal   Collection Time: 05/07/19  5:10 PM  Result Value Ref Range   Color, Urine STRAW (A) YELLOW   APPearance CLEAR CLEAR   Specific Gravity, Urine 1.008 1.005 - 1.030   pH 6.0 5.0 - 8.0   Glucose, UA NEGATIVE NEGATIVE mg/dL   Hgb urine dipstick NEGATIVE NEGATIVE   Bilirubin Urine NEGATIVE NEGATIVE   Ketones, ur NEGATIVE NEGATIVE mg/dL   Protein, ur NEGATIVE NEGATIVE mg/dL   Nitrite NEGATIVE NEGATIVE   Leukocytes,Ua NEGATIVE NEGATIVE    Comment: Performed at [facility], [place] [address], [place], [place] [address]  CBC WITH DIFFERENTIAL     Status: Abnormal   Collection Time: 05/07/19  5:38 PM  Result Value Ref Range   WBC 8.3 4.0 - 10.5 K/uL   RBC 3.64 (L) 3.87 - 5.11 MIL/uL   Hemoglobin 11.7 (L) 12.0 - 15.0 g/dL   HCT 34.4 (L) 36.0 - 46.0 %   MCV 94.5 80.0 - 100.0 fL   MCH 32.1 26.0 - 34.0 pg   MCHC 34.0 30.0 - 36.0 g/dL   RDW 14.0 11.5 - 15.5 %   Platelets 194 150 - 400 K/uL   nRBC 0.0 0.0 - 0.2 %   Neutrophils Relative % 80 %   Neutro Abs  6.8 1.7 - 7.7 K/uL   Lymphocytes Relative 8 %   Lymphs Abs 0.6 (L) 0.7 - 4.0 K/uL   Monocytes Relative 10 %   Monocytes Absolute 0.8 0.1 - 1.0 K/uL   Eosinophils Relative 1 %   Eosinophils Absolute 0.1 0.0 - 0.5 K/uL   Basophils Relative 0 %   Basophils Absolute 0.0 0.0 - 0.1 K/uL   Immature Granulocytes 1 %   Abs Immature Granulocytes 0.04 0.00 - 0.07 K/uL    Comment: Performed at [facility] [address] [place], [place] [address]  Comprehensive metabolic panel     Status: Abnormal   Collection Time: 05/07/19  5:38 PM  Result Value Ref Range   Sodium 131 (L) 135 - 145 mmol/L   Potassium 3.3 (L) 3.5 - 5.1 mmol/L   Chloride 90 (L) 98 - 111 mmol/L   CO2 27 22 - 32 mmol/L   Glucose, Bld 123 (H) 70 - 99 mg/dL   BUN 51 (H) 8 - 23 mg/dL   Creatinine, Ser 2.17 (H) 0.44 - 1.00 mg/dL   Calcium 9.6 8.9 - 10.3 mg/dL   Total Protein 7.3 6.5 - 8.1 g/dL   Albumin 3.7 3.5 - 5.0 g/dL   AST 24 15 - 41 U/L   ALT 19 0 - 44 U/L   Alkaline Phosphatase 115 38 - 126 U/L   Total Bilirubin 1.1 0.3 - 1.2 mg/dL   GFR calc non Af Amer 22 (L) >60 mL/min   GFR calc Af Amer 26 (L) >60 mL/min   Anion gap 14 5 - 15    Comment: Performed at [facility], [place] [address], [place], [place] [address]  Magnesium     Status: None   Collection Time: 05/07/19  5:38 PM  Result Value Ref Range   Magnesium 2.1 1.7 - 2.4 mg/dL    Comment: Performed at [facility], [place] [address], [place], [place] [address]   Digoxin level     Status: None   Collection Time: 05/07/19  7:24 PM  Result Value Ref Range   Digoxin Level 1.2 0.8 - 2.0 ng/mL    Comment: Performed at [place] [facility] [place] [address] [place], [place] [address]  CBG monitoring, ED     Status: Abnormal   Collection Time: 05/07/19  8:07 PM  Result Value Ref Range   Glucose-Capillary 149 (H) 70 - 99 mg/dL  SARS Coronavirus 2 (CEPHEID - Performed in [facility] hospital lab), Hosp Order     Status: None   Collection Time: 05/07/19  9:05 PM  Result Value Ref Range   SARS Coronavirus 2 NEGATIVE NEGATIVE    Comment: (NOTE) If result is NEGATIVE SARS-CoV-2 target nucleic acids are NOT DETECTED. The SARS-CoV-2 RNA is generally detectable in upper and lower  respiratory specimens during the acute phase of infection. The lowest  concentration of SARS-CoV-2 viral copies this assay can detect is 250  copies / mL. A negative result does not preclude SARS-CoV-2 infection  and should not be used as the sole basis for treatment or other  patient management decisions.  A negative result may occur with  improper specimen collection / handling, submission of specimen other  than nasopharyngeal swab, presence of viral mutation(s) within the  areas targeted by this assay, and inadequate number of viral copies  (<250 copies / mL). A negative result must be combined with clinical  observations, patient history, and epidemiological information. If result is POSITIVE  SARS-CoV-2 target nucleic acids are DETECTED. The SARS-CoV-2 RNA is generally detectable in upper and lower  respiratory specimens dur ing the acute phase of infection.  Positive  results are indicative of active infection with SARS-CoV-2.  Clinical  correlation with patient history and other diagnostic information is  necessary to determine patient infection status.  Positive results do  not rule out bacterial infection or co-infection with other viruses. If result is PRESUMPTIVE  POSTIVE SARS-CoV-2 nucleic acids MAY BE PRESENT.   A presumptive positive result was obtained on the submitted specimen  and confirmed on repeat testing.  While 2019 novel coronavirus  (SARS-CoV-2) nucleic acids may be present in the submitted sample  additional confirmatory testing may be necessary for epidemiological  and / or clinical management purposes  to differentiate between  SARS-CoV-2 and other Sarbecovirus currently known to infect humans.  If clinically indicated additional testing with an alternate test  methodology [id number]) is advised. The SARS-CoV-2 RNA is generally  detectable in upper and lower respiratory sp ecimens during the acute  phase of infection. The expected result is Negative. Fact Sheet for Patients:  StrictlyIdeas.no Fact Sheet for Healthcare Providers: BankingDealers.co.za This test is not yet approved or cleared by the [place] FDA and has been authorized for detection and/or diagnosis of SARS-CoV-2 by FDA under an Emergency Use Authorization (EUA).  This EUA will remain in effect (meaning this test can be used) for the duration of the COVID-19 declaration under Section 564(b)(1) of the Act, 21 U.S.C. section 360bbb-3(b)(1), unless the authorization is terminated or revoked sooner. Performed at [facility], [place] [address], [place], [place] [address]    Dg Chest Portable 1 View  Result Date: 05/07/2019 CLINICAL DATA:  Syncope EXAM: PORTABLE CHEST 1 VIEW COMPARISON:  06/15/2018 FINDINGS: Left AICD remains in place, unchanged. Right internal jugular PICC line tip is in the lower SVC. Cardiomegaly. Lungs clear. No effusions or edema. No acute bony abnormality. IMPRESSION: Cardiomegaly.  No active disease. Electronically Signed   By: Rolm Baptise M.D.   On: 05/07/2019 18:16    Pending Labs Unresulted Labs (From admission, onward)    Start     Ordered   05/08/19 0500  Brain natriuretic peptide   Tomorrow morning,   R     05/07/19 2145   Signed and Held  Basic metabolic panel  Daily,   R     Signed and Held          Vitals/Pain Today's Vitals   05/07/19 2045 05/07/19 2100 05/07/19 2130 05/07/19 2200  BP: (!) 102/57 106/81 103/86 (!) 104/56  Pulse: 92 91 91 88  Resp:  20 19 18   Temp:      TempSrc:      SpO2: 98% 96% 97% 97%  PainSc:        Isolation Precautions No active isolations  Medications Medications  aspirin tablet 81 mg (has no administration in time range)  isosorbide mononitrate (IMDUR) 24 hr tablet 30 mg (has no administration in time range)  losartan (COZAAR) tablet 12.5 mg (has no administration in time range)  mexiletine (MEXITIL) capsule 150 mg (has no administration in time range)  nitroGLYCERIN (NITROSTAT) SL tablet 0.4 mg (has no administration in time range)  rosuvastatin (CRESTOR) tablet 5 mg (has no administration in time range)  spironolactone (ALDACTONE) tablet 25 mg (has no administration in time range)  torsemide (DEMADEX) tablet 60 mg (has no administration in time range)  potassium chloride SA (K-DUR) CR tablet 40  mEq (40 mEq Oral Given 05/07/19 1923)  sodium chloride 0.9 % bolus 500 mL (0 mLs Intravenous Stopped 05/07/19 2055)    Mobility walks with person assist Low fall risk   Focused Assessments Cardiac Assessment Handoff:  Cardiac Rhythm: Normal sinus rhythm Lab Results  Component Value Date   TROPONINI 0.03 ([place]) 06/16/2018   No results found for: DDIMER Does the Patient currently have chest pain? No  ,    R Recommendations: See Admitting Provider Note  Report given to:   Additional Notes:

## 2019-05-08 DIAGNOSIS — Z955 Presence of coronary angioplasty implant and graft: Secondary | ICD-10-CM | POA: Diagnosis not present

## 2019-05-08 DIAGNOSIS — I255 Ischemic cardiomyopathy: Secondary | ICD-10-CM | POA: Diagnosis present

## 2019-05-08 DIAGNOSIS — E785 Hyperlipidemia, unspecified: Secondary | ICD-10-CM | POA: Diagnosis present

## 2019-05-08 DIAGNOSIS — M19041 Primary osteoarthritis, right hand: Secondary | ICD-10-CM | POA: Diagnosis present

## 2019-05-08 DIAGNOSIS — I70212 Atherosclerosis of native arteries of extremities with intermittent claudication, left leg: Secondary | ICD-10-CM | POA: Diagnosis present

## 2019-05-08 DIAGNOSIS — I70213 Atherosclerosis of native arteries of extremities with intermittent claudication, bilateral legs: Secondary | ICD-10-CM | POA: Diagnosis not present

## 2019-05-08 DIAGNOSIS — N183 Chronic kidney disease, stage 3 (moderate): Secondary | ICD-10-CM | POA: Diagnosis present

## 2019-05-08 DIAGNOSIS — I447 Left bundle-branch block, unspecified: Secondary | ICD-10-CM | POA: Diagnosis present

## 2019-05-08 DIAGNOSIS — R55 Syncope and collapse: Secondary | ICD-10-CM | POA: Diagnosis present

## 2019-05-08 DIAGNOSIS — Z79899 Other long term (current) drug therapy: Secondary | ICD-10-CM | POA: Diagnosis not present

## 2019-05-08 DIAGNOSIS — I4901 Ventricular fibrillation: Secondary | ICD-10-CM | POA: Diagnosis present

## 2019-05-08 DIAGNOSIS — Z7951 Long term (current) use of inhaled steroids: Secondary | ICD-10-CM | POA: Diagnosis not present

## 2019-05-08 DIAGNOSIS — I5022 Chronic systolic (congestive) heart failure: Secondary | ICD-10-CM | POA: Diagnosis present

## 2019-05-08 DIAGNOSIS — I252 Old myocardial infarction: Secondary | ICD-10-CM | POA: Diagnosis not present

## 2019-05-08 DIAGNOSIS — F17210 Nicotine dependence, cigarettes, uncomplicated: Secondary | ICD-10-CM | POA: Diagnosis present

## 2019-05-08 DIAGNOSIS — M19042 Primary osteoarthritis, left hand: Secondary | ICD-10-CM | POA: Diagnosis present

## 2019-05-08 DIAGNOSIS — N179 Acute kidney failure, unspecified: Secondary | ICD-10-CM | POA: Diagnosis not present

## 2019-05-08 DIAGNOSIS — Z7982 Long term (current) use of aspirin: Secondary | ICD-10-CM | POA: Diagnosis not present

## 2019-05-08 DIAGNOSIS — I071 Rheumatic tricuspid insufficiency: Secondary | ICD-10-CM | POA: Diagnosis present

## 2019-05-08 DIAGNOSIS — I130 Hypertensive heart and chronic kidney disease with heart failure and stage 1 through stage 4 chronic kidney disease, or unspecified chronic kidney disease: Secondary | ICD-10-CM | POA: Diagnosis present

## 2019-05-08 DIAGNOSIS — F329 Major depressive disorder, single episode, unspecified: Secondary | ICD-10-CM | POA: Diagnosis present

## 2019-05-08 DIAGNOSIS — I472 Ventricular tachycardia: Secondary | ICD-10-CM | POA: Diagnosis not present

## 2019-05-08 DIAGNOSIS — I272 Pulmonary hypertension, unspecified: Secondary | ICD-10-CM | POA: Diagnosis present

## 2019-05-08 DIAGNOSIS — I493 Ventricular premature depolarization: Secondary | ICD-10-CM | POA: Diagnosis present

## 2019-05-08 DIAGNOSIS — Z1159 Encounter for screening for other viral diseases: Secondary | ICD-10-CM | POA: Diagnosis not present

## 2019-05-08 DIAGNOSIS — J449 Chronic obstructive pulmonary disease, unspecified: Secondary | ICD-10-CM | POA: Diagnosis present

## 2019-05-08 DIAGNOSIS — I2510 Atherosclerotic heart disease of native coronary artery without angina pectoris: Secondary | ICD-10-CM | POA: Diagnosis present

## 2019-05-08 DIAGNOSIS — E876 Hypokalemia: Secondary | ICD-10-CM | POA: Diagnosis present

## 2019-05-08 LAB — BASIC METABOLIC PANEL
Anion gap: 11 (ref 5–15)
BUN: 58 mg/dL — ABNORMAL HIGH (ref 8–23)
CO2: 27 mmol/L (ref 22–32)
Calcium: 9.3 mg/dL (ref 8.9–10.3)
Chloride: 93 mmol/L — ABNORMAL LOW (ref 98–111)
Creatinine, Ser: 2.05 mg/dL — ABNORMAL HIGH (ref 0.44–1.00)
GFR calc Af Amer: 28 mL/min — ABNORMAL LOW (ref >60)
GFR calc non Af Amer: 24 mL/min — ABNORMAL LOW (ref >60)
Glucose, Bld: 93 mg/dL (ref 70–99)
Potassium: 3.8 mmol/L (ref 3.5–5.1)
Sodium: 131 mmol/L — ABNORMAL LOW (ref 135–145)

## 2019-05-08 LAB — BRAIN NATRIURETIC PEPTIDE: B Natriuretic Peptide: 1026.9 pg/mL — ABNORMAL HIGH (ref 0.0–100.0)

## 2019-05-08 MED ORDER — POTASSIUM CHLORIDE CRYS ER 20 MEQ PO TBCR
30.0000 meq | EXTENDED_RELEASE_TABLET | Freq: Two times a day (BID) | ORAL | Status: DC
  Administered 2019-05-08 – 2019-05-09 (×3): 30 meq via ORAL
  Filled 2019-05-08 (×3): qty 1

## 2019-05-08 MED ORDER — SODIUM CHLORIDE 0.9% FLUSH: 10.0000 mL | INTRAVENOUS | Status: DC | PRN

## 2019-05-08 MED ORDER — SODIUM CHLORIDE 0.9% FLUSH: 10.0000 mL | Freq: Two times a day (BID) | INTRAVENOUS | Status: DC

## 2019-05-08 MED ORDER — DIGOXIN 125 MCG PO TABS
0.0625 mg | ORAL_TABLET | Freq: Every day | ORAL | Status: DC
  Administered 2019-05-08: 0.0625 mg via ORAL
  Filled 2019-05-08: qty 1

## 2019-05-08 MED ORDER — DIGOXIN 125 MCG PO TABS: 0.0625 mg | ORAL_TABLET | Freq: Every day | ORAL | Status: DC

## 2019-05-08 NOTE — Progress Notes (Signed)
Progress Note  Patient Name: [name] Date of Encounter: 05/08/2019  Primary Cardiologist: Jenkins Rouge, MD / [place]   Subjective   68 year old with a history of ischemic cardiomyopathy with NYHA class IIIb CHF.  She is on home milrinone.  She has an ICD placement, history of coronary artery disease, hypertension, hyperlipidemia, depression, tobacco abuse.  She was admitted to the hospital with an episode of syncope.  She had been seen in the CHF clinic on May 21.  Her torsemide had been increased at that time.  She was admitted with syncope.  ICD interrogation revealed 2 appropriate shocks (1 failed, 1 successful for VT/VF.  Her potassium was 3.3 at the time.  She is doing better with potassium replacement.  She is not had any further sustained ventricular arrhythmias.  Inpatient Medications    Scheduled Meds: . aspirin  81 mg Oral Daily  . isosorbide mononitrate  30 mg Oral Daily  . losartan  12.5 mg Oral BID  . mexiletine  150 mg Oral BID  . rosuvastatin  5 mg Oral Once per day on Mon Wed Fri  . sodium chloride flush  3 mL Intravenous Q12H  . spironolactone  25 mg Oral Daily  . torsemide  60 mg Oral BID   Continuous Infusions: . sodium chloride     PRN Meds: sodium chloride, acetaminophen, nitroGLYCERIN, ondansetron (ZOFRAN) IV, sodium chloride flush   Vital Signs    Vitals:   05/08/19 0431 05/08/19 0433 05/08/19 0838 05/08/19 0841  BP:  (!) 96/47 (!) 81/58 (!) 87/53  Pulse:  88 87 88  Resp:  18    Temp:  98.2 F (36.8 C)    TempSrc:  Oral    SpO2:  91% 98%   Weight: 51.8 kg     Height:        Intake/Output Summary (Last 24 hours) at 05/08/2019 1113 Last data filed at 05/08/2019 0847 Gross per 24 hour  Intake 1540 ml  Output 500 ml  Net 1040 ml   Last 3 Weights 05/08/2019 05/07/2019 04/27/2019  Weight (lbs) 114 lb 3.2 oz 118 lb 12.8 oz 116 lb  Weight (kg) 51.801 kg 53.887 kg 52.617 kg      Telemetry     NSR , PVCs  - Personally Reviewed  ECG      - Personally Reviewed  Physical Exam   GEN:  elderly female,  NAD  Neck: No JVD Cardiac: RRR, no murmurs, rubs, or gallops.  Respiratory: Clear to auscultation bilaterally. GI: Soft, nontender, non-distended  MS: No edema; No deformity. Neuro:  Nonfocal  Psych: Normal affect   Labs    Chemistry Recent Labs  Lab 05/07/19 1738 05/08/19 0634  NA 131* 131*  K 3.3* 3.8  CL 90* 93*  CO2 27 27  GLUCOSE 123* 93  BUN 51* 58*  CREATININE 2.17* 2.05*  CALCIUM 9.6 9.3  PROT 7.3  --   ALBUMIN 3.7  --   AST 24  --   ALT 19  --   ALKPHOS 115  --   BILITOT 1.1  --   GFRNONAA 22* 24*  GFRAA 26* 28*  ANIONGAP 14 11     Hematology Recent Labs  Lab 05/07/19 1738  WBC 8.3  RBC 3.64*  HGB 11.7*  HCT 34.4*  MCV 94.5  MCH 32.1  MCHC 34.0  RDW 14.0  PLT 194    Cardiac EnzymesNo results for input(s): TROPONINI in the last 168 hours. No results for input(s):  TROPIPOC in the last 168 hours.   BNP Recent Labs  Lab 05/08/19 0634  BNP 1,026.9*     DDimer No results for input(s): DDIMER in the last 168 hours.   Radiology    Dg Chest Portable 1 View  Result Date: 05/07/2019 CLINICAL DATA:  Syncope EXAM: PORTABLE CHEST 1 VIEW COMPARISON:  06/15/2018 FINDINGS: Left AICD remains in place, unchanged. Right internal jugular PICC line tip is in the lower SVC. Cardiomegaly. Lungs clear. No effusions or edema. No acute bony abnormality. IMPRESSION: Cardiomegaly.  No active disease. Electronically Signed   By: Rolm Baptise M.D.   On: 05/07/2019 18:16    Cardiac Studies     Patient Profile     68 y.o. female with ischemic CM,  Admitted with syncope.  [place] interrogation reveals VT/VF with 2 ICD shocks   Assessment & Plan    1   Ventricular Tach / VF :  Likely related to her hypokalemia ( K = 3.3 on admission)    She is better after potassium replacement .  Will continue higher dose of kdur,   Watch her through the day and anticpate DC tomorrow if she remains stable  Will  restart Digoxin,  Check BMP tomorrow .     For questions or updates, please contact [place] Please consult [email or web address] for contact info under        Signed, Mertie Moores, MD  05/08/2019, 11:13 AM

## 2019-05-08 NOTE — TOC Initial Note (Signed)
Transition of Care [facility]) - Initial/Assessment Note    Patient Details  Name: [name] MRN: [id number] Date of Birth: 05/19/1951  Transition of Care [facility]) CM/SW Contact:    Sherrilyn Rist Phone Number: [phone] 05/08/2019, 3:20 PM  Clinical Narrative:                 Patient lives at home; Primary Care Provider: Stephens Shire, MD; has private insurance with Medicare; is on home Milrinone; [place] services provided by [facility] [place] as prior to admission. CM will continue to follow for progression of care.  Expected Discharge Plan: [place] Barriers to Discharge: No Barriers Identified   Patient Goals and CMS Choice Patient states their goals for this hospitalization and ongoing recovery are:: to stay at home CMS Medicare.gov Compare Post Acute Care list provided to:: Patient Choice offered to / list presented to : NA  Expected Discharge Plan and Services Expected Discharge Plan: [place] In-house Referral: NA Discharge Planning Services: CM Consult Post Acute Care Choice: Resumption of Svcs/PTA Provider Living arrangements for the past 2 months: Single Family Home Expected Discharge Date: 05/01/19                         HH Arranged: RN [place] Agency: [place] ([facility])        Prior Living Arrangements/Services Living arrangements for the past 2 months: Single Family Home Lives with:: Self Patient language and need for interpreter reviewed:: Yes        Need for Family Participation in Patient Care: Yes (Comment) Care giver support system in place?: Yes (comment)   Criminal Activity/Legal Involvement Pertinent to Current Situation/Hospitalization: No - Comment as needed  Activities of Daily Living Home Assistive Devices/Equipment: None ADL Screening (condition at time of admission) Patient's cognitive ability adequate to safely complete daily activities?: Yes Is the patient deaf or have  difficulty hearing?: No Does the patient have difficulty seeing, even when wearing glasses/contacts?: No Does the patient have difficulty concentrating, remembering, or making decisions?: No Patient able to express need for assistance with ADLs?: Yes Does the patient have difficulty dressing or bathing?: Yes Independently performs ADLs?: Yes (appropriate for developmental age) Does the patient have difficulty walking or climbing stairs?: No Weakness of Legs: None Weakness of Arms/Hands: None  Permission Sought/Granted                  Emotional Assessment         Alcohol / Substance Use: Not Applicable Psych Involvement: No (comment)  Admission diagnosis:  syncope Patient Active Problem List   Diagnosis Date Noted  . Syncope 05/07/2019  . Acute on chronic systolic (congestive) heart failure ([place]) 12/22/2018  . Pressure injury of skin 12/22/2018  . AKI (acute kidney injury) ([place])   . Abdominal pain 06/16/2018  . Hypokalemia 06/16/2018  . Rectal bleeding 06/16/2018  . Elevated troponin 06/16/2018  . Iron deficiency anemia 06/16/2018  . Abnormal LFTs 06/16/2018  . Abdominal pain, RUQ   . Hyponatremia 06/08/2018  . Acute CHF (congestive heart failure) ([place]) 06/04/2018  . Acute on chronic systolic CHF (congestive heart failure) ([place]) 06/04/2018  . Dizziness 07/30/2017  . Generalized anxiety disorder 04/08/2016  . Tenosynovitis, de Quervain 04/08/2016  . Acute renal failure superimposed on stage 3 chronic kidney disease ([place]) 03/31/2016  . Allergic rhinitis 02/06/2016  . Disease of nasal cavity and sinuses 02/06/2016  . Cardiomyopathy (  [place]) 02/06/2016  . Asthma 02/06/2016  . Diaphragmatic hernia 02/06/2016  . Dyslipidemia 02/06/2016  . GERD (gastroesophageal reflux disease) 02/06/2016  . Primary snoring 02/06/2016  . CAD (coronary artery disease) 02/06/2016  . Intermittent claudication ([place]) 08/07/2014  . Bilateral carotid artery stenosis 08/07/2014  . Stenosis of  carotid artery 08/07/2014  . Precordial chest pain 07/25/2014  . Chronic systolic heart failure ([place]) 10/10/2013  . Chronic obstructive pulmonary disease ([place]) 10/10/2013  . Hyperlipidemia 06/22/2013  . Leg pain 06/05/2013  . Implantable cardioverter-defibrillator (ICD) in situ 09/17/2011  . Congestive heart failure ([place]) 08/14/2011  . Tobacco abuse 01/30/2011  . Other symptoms involving cardiovascular system 01/30/2011  . Depression 05/01/2009  . Essential hypertension 05/01/2009  . MYOCARDIAL INFARCTION 05/01/2009  . Coronary atherosclerosis 05/01/2009  . CARDIOMYOPATHY, ISCHEMIC 05/01/2009  . Chronic ischemic heart disease 05/01/2009   PCP:  Stephens Shire, MD Pharmacy:   [id number] - [place], [place] - [address] [place] [address] Phone: [phone] Fax: [phone]     Social Determinants of Health (SDOH) Interventions    Readmission Risk Interventions No flowsheet data found.

## 2019-05-08 NOTE — Progress Notes (Signed)
Patient tunneled line assessed. Line seems stable no present issues. Patient states home health manages line care and her dressing was last changed on Friday May 29. Next dressing change would be June 5th if the patient remains in the hospital until that time. Patient requesting dressing not to be changed at this time. Patient also has milronone infusing though the line which was also placed by home health and the medication last for 7 days, per the patient. Shirlee Limerick RN notified

## 2019-05-08 NOTE — Progress Notes (Signed)
Pharmacy made aware that patient had Milrinone drip  form home, this Rn verified if it will be switch to  and our pump. Pharmacist said she has to wait for the MD to  order.

## 2019-05-08 NOTE — Progress Notes (Signed)
Pt arrived to room 3E06 from [facility]. A&Ox3. No complaints of pain. VSS. SR on tel. No skin breakdown. CHF plan of care initiated. Patient advised to wait for asistance when out of bed. Oriented to room and call system.

## 2019-05-08 NOTE — Plan of Care (Signed)
  Problem: Clinical Measurements: Goal: Ability to maintain clinical measurements within normal limits will improve Outcome: Progressing Goal: Will remain free from infection Outcome: Progressing   Problem: Coping: Goal: Level of anxiety will decrease Outcome: Progressing   Problem: Elimination: Goal: Will not experience complications related to bowel motility Outcome: Progressing   Problem: Safety: Goal: Ability to remain free from injury will improve Outcome: Progressing   Problem: Activity: Goal: Risk for activity intolerance will decrease Outcome: Completed/Met

## 2019-05-08 NOTE — Consult Note (Addendum)
Advanced Heart Failure Team Consult Note   Primary Physician: Stephens Shire, MD PCP-Cardiologist:  Jenkins Rouge, MD  Consulting: [place]  Reason for Consultation: HF/VT  HPI:    Terri Schlauch Biggsis a 68 y.o.femalewith a history of CAD, s/p prior Ant MI, s/p prior POBA to LAD, Dx, RCA, CHF due to ICM with high scar burden by MRI (12/09), s/p ICD, HTN, HL, depression,andtobacco abuse.   Admitted 7/10 through 06/28/2018. Admitted with increase dyspnea and and abdominal pain. Required short term milrinone and diuresed with IV lasix. Transitioned to torsemide 40 mg daily. GI consulted for abdominal pain. HIDDA scan was negative. Also had 20% PVCs so mexiletine was started. Discharge weight 115 pounds.   CPX 9/19 severe HF limitation with pVO2 14.2 and slope 40.   She underwent RHC on 12/22/18 notable for low output with MV sat 47% and CI 1.7. PCWP 20. She was admitted and started on milrinone. Druing that admit also underwent PFTs with further decrease in DLCO.  Echo 01/20/19: EF 15-20%, RV normal, mod MR  Remains on milrinone 0.25. has been feeling ok but recently with some volume overload and torsemide increased from 60 bid to 80/60. Admitted last night with syncope and ICD firing for VT. Now back in NSR. Feels back to baseline. Denies head trauma. No CP. Volume status improved with increased torsemide. K 3.3 Mg 2.1   Review of Systems: [y] = yes, [ ]  = no   . General: Weight gain [ ] ; Weight loss [ ] ; Anorexia [ ] ; Fatigue [ y]; Fever [ ] ; Chills [ ] ; Weakness [ ]   . Cardiac: Chest pain/pressure [ ] ; Resting SOB [ ] ; Exertional SOB [ y]; Orthopnea [ ] ; Pedal Edema [ y]; Palpitations [ ] ; Syncope [ ] ; Presyncope [ ] ; Paroxysmal nocturnal dyspnea[ ]   . Pulmonary: Cough [ ] ; Wheezing[ ] ; Hemoptysis[ ] ; Sputum [ ] ; Snoring [ ]   . GI: Vomiting[ ] ; Dysphagia[ ] ; Melena[ ] ; Hematochezia [ ] ; Heartburn[ ] ; Abdominal pain [ ] ; Constipation [ ] ; Diarrhea [ ] ; BRBPR [ ]   . GU:  Hematuria[ ] ; Dysuria [ ] ; Nocturia[ ]   . Vascular: Pain in legs with walking [ y]; Pain in feet with lying flat [ ] ; Non-healing sores [ ] ; Stroke [ ] ; TIA [ ] ; Slurred speech [ ] ;  . Neuro: Headaches[ ] ; Vertigo[ ] ; Seizures[ ] ; Paresthesias[ ] ;Blurred vision [ ] ; Diplopia [ ] ; Vision changes [ ]   . Ortho/Skin: Arthritis [ y]; Joint pain [ y]; Muscle pain [ ] ; Joint swelling [ ] ; Back Pain [ ] ; Rash [ ]   . Psych: Depression[y ]; Anxiety[y ]  . Heme: Bleeding problems [ ] ; Clotting disorders [ ] ; Anemia [ ]   . Endocrine: Diabetes [ ] ; Thyroid dysfunction[ ]   Home Medications Prior to Admission medications   Medication Sig Start Date End Date Taking? Authorizing Provider  acetaminophen (TYLENOL ARTHRITIS PAIN) 650 MG CR tablet Take 650 mg by mouth every 8 (eight) hours as needed for pain.    Yes [provider]  albuterol (PROAIR HFA) 108 (90 Base) MCG/ACT inhaler Inhale 2 puffs into the lungs every 6 (six) hours as needed for wheezing.  01/12/18  Yes [provider]  aspirin EC 81 MG tablet Take 81 mg by mouth daily.   Yes [provider]  azelastine (ASTELIN) 0.1 % nasal spray Place 1 spray into both nostrils daily.    Yes [provider]  budesonide-formoterol (SYMBICORT) 160-4.5 MCG/ACT inhaler Inhale 2 puffs into  the lungs daily.  10/14/17  Yes [provider]  citalopram (CELEXA) 20 MG tablet Take 20 mg by mouth at bedtime.  12/16/17  Yes [provider]  clonazePAM (KLONOPIN) 0.5 MG tablet Take 0.5 mg by mouth 2 (two) times daily as needed for anxiety.  11/07/18  Yes [provider]  digoxin (LANOXIN) 0.125 MG tablet TAKE 1/2 TABLET BY MOUTH EVERY DAY Patient taking differently: Take 0.0625 mg by mouth daily.  08/18/18  Yes Shirley Friar, PA-C  ezetimibe (ZETIA) 10 MG tablet TAKE 1/2 TABLET TWICE A WEEK Patient taking differently: Take 5 mg by mouth 2 (two) times a week. Saturday and Sunday 11/07/18  Yes Bensimhon, Shaune Pascal, MD  Famotidine (PEPCID PO) Take 1 tablet by mouth 3 (three) times daily as needed (itching).   Yes [provider]  fish oil-omega-3 fatty acids 1000 MG capsule Take 2 g by mouth at bedtime.    Yes [provider]  fluticasone (FLONASE) 50 MCG/ACT nasal spray Place 2 sprays into both nostrils at bedtime.    Yes [provider]  hydrocortisone cream 1 % Apply 1 application topically 4 (four) times daily as needed for itching.   Yes [provider]  isosorbide mononitrate (IMDUR) 30 MG 24 hr tablet TAKE 1 TABLET BY MOUTH EVERY DAY Patient taking differently: Take 30 mg by mouth at bedtime.  08/02/18  Yes Clegg, Amy D, NP  loratadine (CLARITIN) 10 MG tablet Take 10 mg by mouth daily as needed (seasonal allergies).    Yes [provider]  losartan (COZAAR) 25 MG tablet TAKE 0.5 TABLETS (12.5 MG TOTAL) BY MOUTH 2 (TWO) TIMES DAILY. 07/27/18  Yes Bensimhon, Shaune Pascal, MD  metolazone (ZAROXOLYN) 2.5 MG tablet Take 1 tablet (2.5 mg total) by mouth as needed for up to 10 days. Patient taking differently: Take 2.5 mg by mouth every Friday.  01/20/19 06/06/19 Yes Bensimhon, Shaune Pascal, MD  mexiletine (MEXITIL) 150 MG capsule Take 1 capsule (150 mg total) by mouth 2 (two) times daily. 07/06/18  Yes Clegg, Amy D, NP  milrinone (PRIMACOR) 20 MG/100 ML SOLN infusion Inject 0.013 mg/min into the vein continuous. 12/23/18  Yes Georgiana Shore, NP  nitroGLYCERIN (NITROSTAT) 0.4 MG SL tablet Place 1 tablet (0.4 mg total) under the tongue every 5 (five) minutes as needed for chest pain. 10/28/16  Yes Josue Hector, MD  omeprazole (PRILOSEC) 40 MG capsule Take 40 mg by mouth daily.    Yes [provider]  polyethylene glycol (MIRALAX / GLYCOLAX) packet Take 17 g by mouth at bedtime.   Yes [provider]  potassium chloride SA (K-DUR,KLOR-CON) 20 MEQ tablet Take 2 tablets (40 mEq total) by mouth as needed. Take when taking Metolazone Patient taking differently:  Take 40 mEq by mouth every Friday. Take when taking Metolazone 01/20/19  Yes Bensimhon, Shaune Pascal, MD  rosuvastatin (CRESTOR) 5 MG tablet Take 1 tablet by mouth on Monday, Wednesday, and Friday Patient taking differently: Take 5 mg by mouth every Monday, Wednesday, and Friday.  04/29/18  Yes Wellington Hampshire, MD  spironolactone (ALDACTONE) 25 MG tablet TAKE 1 TABLET BY MOUTH EVERY DAY Patient taking differently: Take 25 mg by mouth daily.  02/02/19  Yes Bensimhon, Shaune Pascal, MD  torsemide (DEMADEX) 20 MG tablet Taking 60 mg BID Patient taking differently: Take 60-80 mg by mouth See admin instructions. Take 4 tablets (80 mg) by mouth every morning and 3 tablets (60 mg) at 4pm 05/02/19  Yes Larey Dresser, MD    Past Medical History: Past Medical History:  Diagnosis Date  . Arthritis    hands  . Asthma   . CAD (coronary artery disease)    a. s/p prior Ant MI, b. s/p prior POBA to LAD, Dx, RCA,;  c.  LHC (10/12):  dLAD 40, pCFX 30, OM1 50, pRCA 30;  d.  [facility] Myoview (11/14):  High risk study; inferior, anterior, apical defects; minimal reversibility toward the apex; consistent with prior infarct and very mild peri-infarct ischemia, EF 24%, inferior/apical/anterior akinesis  . CHF (congestive heart failure) ([place])   . Chronic systolic heart failure ([place])   . CKD (chronic kidney disease), stage III ([place])   . Depression   . H/O hiatal hernia   . HLD (hyperlipidemia)   . HTN (hypertension)   . Ischemic cardiomyopathy    MRI (12/09):  High scar burden, Inf and Apical AK, EF 24%.;  Echo (10/14): EF 15%, Gr 2 DD, mild to mod MR, mod LAE, RVSF mildly reduced, mod RAE, severe TR, PASP 49-53 (mod pulmonary HTN)  . MI (myocardial infarction) ([place])   . Tobacco abuse     Past Surgical History: Past Surgical History:  Procedure Laterality Date  . ABDOMINAL AORTOGRAM W/LOWER EXTREMITY N/A 03/02/2018   Procedure: ABDOMINAL AORTOGRAM W/LOWER EXTREMITY;  Surgeon: Wellington Hampshire, MD;  Location: [facility];  Service: Cardiovascular;  Laterality: N/A;  . CARDIAC DEFIBRILLATOR PLACEMENT  01/18/2009   MDT single chamber ICD implanted by Dr Rayann Heman   . cardiac stents    . COLONOSCOPY  03/18/2012   Procedure: COLONOSCOPY;  Surgeon: Beryle Beams, MD;  Location: [facility];  Service: Endoscopy;  Laterality: N/A;  . CORONARY ANGIOPLASTY    . ESOPHAGOGASTRODUODENOSCOPY N/A 03/10/2013   Procedure: ESOPHAGOGASTRODUODENOSCOPY (EGD);  Surgeon: Beryle Beams, MD;  Location: [facility];  Service: Endoscopy;  Laterality: N/A;  . IR FLUORO GUIDE CV LINE RIGHT  12/23/2018  . IR US GUIDE VASC ACCESS RIGHT  12/23/2018  . LEFT HEART CATHETERIZATION WITH CORONARY ANGIOGRAM N/A 07/26/2014   Procedure: LEFT HEART CATHETERIZATION WITH CORONARY ANGIOGRAM;  Surgeon: Jolaine Artist, MD;  Location: [facility];  Service: Cardiovascular;  Laterality: N/A;  . RIGHT HEART CATH N/A 12/22/2018   Procedure: RIGHT HEART CATH;  Surgeon: Jolaine Artist, MD;  Location: [facility];  Service: Cardiovascular;  Laterality: N/A;  . RIGHT/LEFT HEART CATH AND CORONARY ANGIOGRAPHY N/A 06/22/2018   Procedure: RIGHT/LEFT HEART CATH AND CORONARY ANGIOGRAPHY;  Surgeon: Larey Dresser, MD;  Location: [facility];  Service: Cardiovascular;  Laterality: N/A;    Family History: Family History  Problem Relation Age of Onset  . Stroke Mother   . Diabetes Mellitus II Sister   . Breast cancer Sister     Social History: Social History   Socioeconomic History  . Marital status: Divorced    Spouse name: Not on file  . Number of children: 2  . Years of education: Not on file  . Highest education level: Not on file  Occupational History  . Occupation: Works for [employer]: [place]  . Financial resource strain: Not on file  . Food insecurity:    Worry: Not on file    Inability: Not on file  . Transportation needs:    Medical: Not on file    Non-medical: Not on file  Tobacco  Use  . Smoking status: Current Every Day  Smoker    Years: 40.00    Types: Cigarettes  . Smokeless tobacco: Never Used  . Tobacco comment: I am cutting back  Substance and Sexual Activity  . Alcohol use: No  . Drug use: No  . Sexual activity: Not Currently  Lifestyle  . Physical activity:    Days per week: Not on file    Minutes per session: Not on file  . Stress: Not on file  Relationships  . Social connections:    Talks on phone: Not on file    Gets together: Not on file    Attends religious service: Not on file    Active member of club or organization: Not on file    Attends meetings of clubs or organizations: Not on file    Relationship status: Not on file  Other Topics Concern  . Not on file  Social History Narrative   Lives in [place]    Allergies:  Allergies  Allergen Reactions  . Prednisone Shortness Of Breath, Swelling, Rash and Other (See Comments)    Sore throat, also  . Zebeta [Bisoprolol Fumarate] Other (See Comments)    Caused confusion  . Eggs Or Egg-Derived Products Other (See Comments)    Was told she is allergic to EGG WHITES  . Penicillins Itching and Rash    Did it involve swelling of the face/tongue/throat, SOB, or low BP? No Did it involve sudden or severe rash/hives, skin peeling, or any reaction on the inside of your mouth or nose? No Did you need to seek medical attention at a hospital or doctor's office? No When did it last happen?68 years old If all above answers are "NO", may proceed with cephalosporin use.  . Statins Other (See Comments)    Gradually tired with daily Lipitor, made pt feel badly (Pt can take Crestor 5 mg 4 times per week and Zetia 5 mg two times a week)    Objective:    Vital Signs:   Temp:  [97.7 F (36.5 C)-98.3 F (36.8 C)] 98.2 F (36.8 C) (06/01 1627) Pulse Rate:  [85-99] 85 (06/01 1627) Resp:  [17-25] 18 (06/01 1627) BP: (81-112)/(47-86) 90/55 (06/01 1627) SpO2:  [91 %-100 %] 96 % (06/01 1627)  Weight:  [51.8 kg-53.9 kg] 51.8 kg (06/01 0431) Last BM Date: 05/06/19  Weight change: Filed Weights   05/07/19 2345 05/08/19 0431  Weight: 53.9 kg 51.8 kg    Intake/Output:   Intake/Output Summary (Last 24 hours) at 05/08/2019 1720 Last data filed at 05/08/2019 1700 Gross per 24 hour  Intake 1980 ml  Output 950 ml  Net 1030 ml      Physical Exam    General:  Well appearing. No resp difficulty HEENT: normal Neck: supple. JVP . Carotids 2+ bilat; no bruits. No lymphadenopathy or thyromegaly appreciated. Cor: PMI nondisplaced. Regular rate & rhythm. No rubs, gallops or murmurs. Lungs: clear Abdomen: soft, nontender, nondistended. No hepatosplenomegaly. No bruits or masses. Good bowel sounds. Extremities: no cyanosis, clubbing, rash, edema Neuro: alert & orientedx3, cranial nerves grossly intact. moves all 4 extremities w/o difficulty. Affect pleasant   Telemetry   NSR 70-80s Personally reviewed   EKG    NSR 90 chronic LBBB Personally reviewed   Labs   Basic Metabolic Panel: Recent Labs  Lab 05/07/19 1738 05/08/19 0634  NA 131* 131*  K 3.3* 3.8  CL 90* 93*  CO2 27 27  GLUCOSE 123* 93  BUN 51* 58*  CREATININE 2.17* 2.05*  CALCIUM 9.6 9.3  MG 2.1  --     Liver Function Tests: Recent Labs  Lab 05/07/19 1738  AST 24  ALT 19  ALKPHOS 115  BILITOT 1.1  PROT 7.3  ALBUMIN 3.7   No results for input(s): LIPASE, AMYLASE in the last 168 hours. No results for input(s): AMMONIA in the last 168 hours.  CBC: Recent Labs  Lab 05/07/19 1738  WBC 8.3  NEUTROABS 6.8  HGB 11.7*  HCT 34.4*  MCV 94.5  PLT 194    Cardiac Enzymes: No results for input(s): CKTOTAL, CKMB, CKMBINDEX, TROPONINI in the last 168 hours.  BNP: BNP (last 3 results) Recent Labs    07/06/18 1621 09/14/18 1143 05/08/19 0634  BNP 1,705.1* 1,056.5* 1,026.9*    ProBNP (last 3 results) No results for input(s): PROBNP in the last 8760 hours.   CBG: Recent Labs  Lab 05/07/19  2007  GLUCAP 149*    Coagulation Studies: No results for input(s): LABPROT, INR in the last 72 hours.   Imaging   Dg Chest Portable 1 View  Result Date: 05/07/2019 CLINICAL DATA:  Syncope EXAM: PORTABLE CHEST 1 VIEW COMPARISON:  06/15/2018 FINDINGS: Left AICD remains in place, unchanged. Right internal jugular PICC line tip is in the lower SVC. Cardiomegaly. Lungs clear. No effusions or edema. No acute bony abnormality. IMPRESSION: Cardiomegaly.  No active disease. Electronically Signed   By: Rolm Baptise M.D.   On: 05/07/2019 18:16      Medications:     Current Medications: . aspirin  81 mg Oral Daily  . [START ON 05/10/2019] digoxin  0.0625 mg Oral Daily  . isosorbide mononitrate  30 mg Oral Daily  . losartan  12.5 mg Oral BID  . mexiletine  150 mg Oral BID  . potassium chloride  30 mEq Oral BID  . rosuvastatin  5 mg Oral Once per day on Mon Wed Fri  . sodium chloride flush  10-40 mL Intracatheter Q12H  . sodium chloride flush  3 mL Intravenous Q12H  . spironolactone  25 mg Oral Daily  . torsemide  60 mg Oral BID     Infusions: . sodium chloride        Assessment/Plan   1, VT in setting of hypokalemia (k 3,3)  - ICD interrogation reviewed - K improved to 3.8 . Mg 2,1  - Will continue supp keep K > 4.0 Mg > 2.0 - No driving x 6 months  2. Chronic Systolic CHF due to ICM with mild RV dysfunction s/p Medtronic ICD.  - Echo2/14/20EF 15-20%, RV normal, mod MR - CPX 9/19 with severe functional limitation due predominantly to HF. - RHC 1/20 with low output. CI 1.7. Started on home milrinone at 0.25 - Chronic NYHA III-IIIB symptoms. Now on home milrinone 0.25 - Volume status improved with recent increase of torsemide to 80/60 watch for overdiuresis  - Digoxin currently on hold as level was 1.2. Discussed dosing with PharmD personally. Will hold and recheck level - Continue losartan 12.5 mgBID.  - Continue spironolactone 25 mg daily. - Does not tolerate beta  blockers due to symptomatic brady in past. - She has stopped smoking. Repeat PFTs and DLCOwhen able (delayed due to [place]).If improved, could consider VAD. I will try to get these scheduled. - I discussed with Dr. Prescott Gum today and we both feel that with severe COPD and CKD not good candidate for VAD. ICD is at [facility] and with LBBB may be worthwhile to pursue CRT-D. I will d/w Dr. Rayann Heman  3. CAD s/p previous anterior MI: Cath 8/15 nonobstructive CAD.  - Cath 7/19 non-obstructive CAD.  - Continue ASA and statin. Continue imdur. - No s/s ischemia.   4. COPD  -Stopped smoking 1/27. Using nicotine gum.Remains off cigarettes. See above re: PFTs  5. Acute on CKD 3 - Creatinie baseline seems to be 1.4-1.6 - Now 2.0. Will recheck in am. May have to cut back torsemide  6. PAD with Intermittent claudication: Medical therapy.  - Moderate bilateral calf claudication worse on the left side due to known SFA disease. This has progressed over past few months  - Had peripheral vascular catheterization 03/02/18 with no significant aortoiliac disease. Diffuse 60-70% disease affecting the left proximal and mid SFA with short occlusion distally with reconstitution via collaterals from the profunda. Three-vessel runoff below the knee.   7. PVCs - Continue mexilitene 150 mg BID.Denies pelpitations   Length of Stay: 0  Glori Bickers, MD  05/08/2019, 5:20 PM  Advanced Heart Failure Team Pager [phone] (M-F; 7a - 4p)  Please contact [facility] for night-coverage after hours (4p -7a ) and weekends on [email or web address]

## 2019-05-09 LAB — BASIC METABOLIC PANEL
Anion gap: 12 (ref 5–15)
BUN: 52 mg/dL — ABNORMAL HIGH (ref 8–23)
CO2: 26 mmol/L (ref 22–32)
Calcium: 9.7 mg/dL (ref 8.9–10.3)
Chloride: 96 mmol/L — ABNORMAL LOW (ref 98–111)
Creatinine, Ser: 1.93 mg/dL — ABNORMAL HIGH (ref 0.44–1.00)
GFR calc Af Amer: 30 mL/min — ABNORMAL LOW (ref >60)
GFR calc non Af Amer: 26 mL/min — ABNORMAL LOW (ref >60)
Glucose, Bld: 126 mg/dL — ABNORMAL HIGH (ref 70–99)
Potassium: 3.6 mmol/L (ref 3.5–5.1)
Sodium: 134 mmol/L — ABNORMAL LOW (ref 135–145)

## 2019-05-09 MED ORDER — POTASSIUM CHLORIDE CRYS ER 20 MEQ PO TBCR: 40.0000 meq | EXTENDED_RELEASE_TABLET | Freq: Two times a day (BID) | ORAL | 6 refills | Status: DC

## 2019-05-09 MED ORDER — TORSEMIDE 20 MG PO TABS: ORAL_TABLET | ORAL | 3 refills | Status: AC

## 2019-05-09 NOTE — Progress Notes (Signed)
Advanced Heart Failure Rounding Note   Subjective:    Rhythm stable overnight. No further VT.  BMET pending  Denies SOB, orthopnea or PND.  Weight stable   Objective:   Weight Range:  Vital Signs:   Temp:  [97.7 F (36.5 C)-98.3 F (36.8 C)] 98.1 F (36.7 C) (06/02 0447) Pulse Rate:  [76-93] 76 (06/02 0447) Resp:  [17-18] 17 (06/01 1918) BP: (81-94)/(50-60) 82/50 (06/02 0447) SpO2:  [94 %-98 %] 94 % (06/02 0447) Weight:  [52.2 kg] 52.2 kg (06/02 0450) Last BM Date: 05/08/19  Weight change: Filed Weights   05/07/19 2345 05/08/19 0431 05/09/19 0450  Weight: 53.9 kg 51.8 kg 52.2 kg    Intake/Output:   Intake/Output Summary (Last 24 hours) at 05/09/2019 0808 Last data filed at 05/09/2019 0454 Gross per 24 hour  Intake 1700 ml  Output 1650 ml  Net 50 ml     Physical Exam: General:   Lying in bed. No resp difficulty HEENT: normal Neck: supple. JVP 6-7 . Carotids 2+ bilat; no bruits. No lymphadenopathy or thryomegaly appreciated. Cor: PMI nondisplaced. Regular rate & rhythm. No rubs, gallops or murmurs. Lungs: clear with decreased breath sounds Abdomen: soft, nontender, nondistended. No hepatosplenomegaly. No bruits or masses. Good bowel sounds. Extremities: no cyanosis, clubbing, rash, edema Neuro: alert & orientedx3, cranial nerves grossly intact. moves all 4 extremities w/o difficulty. Affect pleasant  Telemetry: Sinus with LBBB 70s Personally reviewed   Labs: Basic Metabolic Panel: Recent Labs  Lab 05/07/19 1738 05/08/19 0634  NA 131* 131*  K 3.3* 3.8  CL 90* 93*  CO2 27 27  GLUCOSE 123* 93  BUN 51* 58*  CREATININE 2.17* 2.05*  CALCIUM 9.6 9.3  MG 2.1  --     Liver Function Tests: Recent Labs  Lab 05/07/19 1738  AST 24  ALT 19  ALKPHOS 115  BILITOT 1.1  PROT 7.3  ALBUMIN 3.7   No results for input(s): LIPASE, AMYLASE in the last 168 hours. No results for input(s): AMMONIA in the last 168 hours.  CBC: Recent Labs  Lab 05/07/19 1738   WBC 8.3  NEUTROABS 6.8  HGB 11.7*  HCT 34.4*  MCV 94.5  PLT 194    Cardiac Enzymes: No results for input(s): CKTOTAL, CKMB, CKMBINDEX, TROPONINI in the last 168 hours.  BNP: BNP (last 3 results) Recent Labs    07/06/18 1621 09/14/18 1143 05/08/19 0634  BNP 1,705.1* 1,056.5* 1,026.9*    ProBNP (last 3 results) No results for input(s): PROBNP in the last 8760 hours.    Other results:  Imaging: Dg Chest Portable 1 View  Result Date: 05/07/2019 CLINICAL DATA:  Syncope EXAM: PORTABLE CHEST 1 VIEW COMPARISON:  06/15/2018 FINDINGS: Left AICD remains in place, unchanged. Right internal jugular PICC line tip is in the lower SVC. Cardiomegaly. Lungs clear. No effusions or edema. No acute bony abnormality. IMPRESSION: Cardiomegaly.  No active disease. Electronically Signed   By: Rolm Baptise M.D.   On: 05/07/2019 18:16      Medications:     Scheduled Medications: . aspirin  81 mg Oral Daily  . [START ON 05/10/2019] digoxin  0.0625 mg Oral Daily  . isosorbide mononitrate  30 mg Oral Daily  . losartan  12.5 mg Oral BID  . mexiletine  150 mg Oral BID  . potassium chloride  30 mEq Oral BID  . rosuvastatin  5 mg Oral Once per day on Mon Wed Fri  . sodium chloride flush  10-40 mL Intracatheter  Q12H  . sodium chloride flush  3 mL Intravenous Q12H  . spironolactone  25 mg Oral Daily  . torsemide  60 mg Oral BID     Infusions: . sodium chloride       PRN Medications:  sodium chloride, acetaminophen, nitroGLYCERIN, ondansetron (ZOFRAN) IV, sodium chloride flush, sodium chloride flush   Assessment/Plan:    1, VT in setting of hypokalemia (k 3,3)  - ICD interrogation reviewed - No further VT - Will continue supp keep K > 4.0 Mg > 2.0 - No driving x 6 months  2. Chronic Systolic CHF due to ICM with mild RV dysfunction s/p Medtronic ICD.  - Echo2/14/20EF 15-20%, RV normal, mod MR - CPX 9/19 with severe functional limitation due predominantly to HF. - RHC 1/20  with low output. CI 1.7. Started on home milrinone at 0.25 - Chronic NYHA III-IIIB symptoms. Now on home milrinone 0.25 - Volume status improved with recent increase of torsemide to 80/60 watch for overdiuresis  - Digoxin currently on hold as level was 1.2. Discussed dosing with PharmD personally. Will restart at 0.0625 tomorrow - Continue losartan 12.5 mgBID.  - Continue spironolactone 25 mg daily. - Does not tolerate beta blockers due to symptomatic brady in past. - She has stopped smoking. Repeat PFTs and DLCOwhen able (delayed due to [place]).If improved, could consider VAD.I will try to get these scheduled. - I discussed with Dr. Prescott Gum today and we both feel that with severe COPD and CKD not good candidate for VAD. ICD is at [facility] and with LBBB may be worthwhile to pursue CRT-D. D/w Dr. Rayann Heman   3. CAD s/p previous anterior MI: Cath 8/15 nonobstructive CAD.  - Cath 7/19 non-obstructive CAD.  - Continue ASA and statin. Continue imdur. -No s/s ischemia.  4. COPD  -Stopped smoking 1/27. Using nicotine gum.Remains off cigarettes. See above re: PFTs  5. Acute on CKD 3 - Creatinie baseline seems to be 1.4-1.6 - Now 2.0. Will recheck in am. May have to cut back torsemide  6. PAD with Intermittent claudication: Medical therapy.  - Moderate bilateral calf claudication worse on the left side due to known SFA disease. This has progressed over past few months  - Had peripheral vascular catheterization 03/02/18 with no significant aortoiliac disease. Diffuse 60-70% disease affecting the left proximal and mid SFA with short occlusion distally with reconstitution via collaterals from the profunda. Three-vessel runoff below the knee.   7. PVCs - Continue mexilitene 150 mg BID.Denies pelpitations  Ok for d/c today. Will check labs on Friday and next Wednesday including dig level.   Length of Stay: 1   Glori Bickers MD 05/09/2019, 8:08 AM  Advanced Heart Failure Team Pager  [phone] (M-F; 7a - 4p)  Please contact [facility] for night-coverage after hours (4p -7a ) and weekends on [email or web address]

## 2019-05-09 NOTE — Discharge Summary (Addendum)
Advanced Heart Failure Team  Discharge Summary   Patient ID: [name] MRN: [id number], DOB/AGE: Feb 24, 1951 68 y.o. Admit date: 05/07/2019 D/C date:     05/09/2019   Primary Discharge Diagnoses:  1. Syncope in the setting of VT No driving for 6 months.   2. Chronic Systolic HF On home milrinone 0.25 mct.  3. CAD 4. COPD  5. A/C CKD stabe III 6. PAD 7. PVCs  Bowen Course:  Terri Grasse Biggsis a 68 y.o.femalewith a history of CAD, s/p prior Ant MI, s/p prior POBA to LAD, Dx, RCA, CHF due to ICM with high scar burden by MRI (12/09), s/p ICD, HTN, HL, depression,PVCs, andtobacco abuse. She is on chronic milrinone 0. 25 mcg.   Admitted with syncope in the setting of VT. Admission Potassium 3.3 and Mag 2.1. ICD interrogation showed VT. Prior to admit diuretics had been increased. Electrolytes replaced. Digoxin elevated on admit so digoxin was stopped.   Volume status appeared stable and she continued on torsemide 80 mg/60 mg with scheduled potassium added 40 meq tiwce a day.  Potassium daily dose increased. She not further VT. She continued on mexiletine. She is not on amiodarone due to severe COPD.   She was being considered for possible LVAD. [name] discussed with  [name] and given severe COPD and CKD this was not felt to be an option. Plan to continue milrinone for palliation. [facility] will check BMET 6/5 and 05/17/19. I personally discussed with [name], [name] representative.   She will follow up with EP and was instructed to not drive for 6 months. Plan to follow closely in the HF clinic. [facility] will continue to follow for home milrinone.   Discharge Vitals: Blood pressure (!) 96/57, pulse 87, temperature 97.8 F (36.6 C), temperature source Oral, resp. rate 18, height 5\' 3"  (1.6 m), weight 52.2 kg, SpO2 97 %.  Labs: Lab Results  Component Value Date   WBC 8.3 05/07/2019   HGB 11.7 (L) 05/07/2019   HCT 34.4 (L) 05/07/2019   MCV 94.5 05/07/2019   PLT 194 05/07/2019     Recent Labs  Lab 05/07/19 1738  05/09/19 0918  NA 131*   < > 134*  K 3.3*   < > 3.6  CL 90*   < > 96*  CO2 27   < > 26  BUN 51*   < > 52*  CREATININE 2.17*   < > 1.93*  CALCIUM 9.6   < > 9.7  PROT 7.3  --   --   BILITOT 1.1  --   --   ALKPHOS 115  --   --   ALT 19  --   --   AST 24  --   --   GLUCOSE 123*   < > 126*   < > = values in this interval not displayed.   Lab Results  Component Value Date   CHOL 87 12/22/2018   HDL 39 (L) 12/22/2018   LDLCALC 42 12/22/2018   TRIG 31 12/22/2018   BNP (last 3 results) Recent Labs    07/06/18 1621 09/14/18 1143 05/08/19 0634  BNP 1,705.1* 1,056.5* 1,026.9*    ProBNP (last 3 results) No results for input(s): PROBNP in the last 8760 hours.   Diagnostic Studies/Procedures   Dg Chest Portable 1 View  Result Date: 05/07/2019 CLINICAL DATA:  Syncope EXAM: PORTABLE CHEST 1 VIEW COMPARISON:  06/15/2018 FINDINGS: Left AICD remains in place, unchanged. Right internal jugular PICC line tip is  in the lower SVC. Cardiomegaly. Lungs clear. No effusions or edema. No acute bony abnormality. IMPRESSION: Cardiomegaly.  No active disease. Electronically Signed   By: [name] M.D.   On: 05/07/2019 18:16    Discharge Medications   Allergies as of 05/09/2019      Reactions   Prednisone Shortness Of Breath, Swelling, Rash, Other (See Comments)   Sore throat, also   Zebeta [bisoprolol Fumarate] Other (See Comments)   Caused confusion   Eggs Or Egg-derived Products Other (See Comments)   Was told she is allergic to EGG WHITES   Penicillins Itching, Rash   Did it involve swelling of the face/tongue/throat, SOB, or low BP? No Did it involve sudden or severe rash/hives, skin peeling, or any reaction on the inside of your mouth or nose? No Did you need to seek medical attention at a Bowen or doctor's office? No When did it last happen?68 years old If all above answers are "NO", may proceed with cephalosporin use.   Statins Other  (See Comments)   Gradually tired with daily Lipitor, made pt feel badly (Pt can take Crestor 5 mg 4 times per week and Zetia 5 mg two times a week)      Medication List    STOP taking these medications   digoxin 0.125 MG tablet Commonly known as:  LANOXIN     TAKE these medications   aspirin EC 81 MG tablet Take 81 mg by mouth daily. Notes to patient:  6/3   azelastine 0.1 % nasal spray Commonly known as:  ASTELIN Place 1 spray into both nostrils daily.   citalopram 20 MG tablet Commonly known as:  CELEXA Take 20 mg by mouth at bedtime.   clonazePAM 0.5 MG tablet Commonly known as:  KLONOPIN Take 0.5 mg by mouth 2 (two) times daily as needed for anxiety.   ezetimibe 10 MG tablet Commonly known as:  ZETIA TAKE 1/2 TABLET TWICE A WEEK What changed:  See the new instructions.   fish oil-omega-3 fatty acids 1000 MG capsule Take 2 g by mouth at bedtime.   fluticasone 50 MCG/ACT nasal spray Commonly known as:  FLONASE Place 2 sprays into both nostrils at bedtime.   hydrocortisone cream 1 % Apply 1 application topically 4 (four) times daily as needed for itching.   isosorbide mononitrate 30 MG 24 hr tablet Commonly known as:  IMDUR TAKE 1 TABLET BY MOUTH EVERY DAY What changed:  when to take this Notes to patient:  6/3   loratadine 10 MG tablet Commonly known as:  CLARITIN Take 10 mg by mouth daily as needed (seasonal allergies).   losartan 25 MG tablet Commonly known as:  COZAAR TAKE 0.5 TABLETS (12.5 MG TOTAL) BY MOUTH 2 (TWO) TIMES DAILY. Notes to patient:  6/2   metolazone 2.5 MG tablet Commonly known as:  ZAROXOLYN Take 1 tablet (2.5 mg total) by mouth as needed for up to 10 days. What changed:  when to take this   mexiletine 150 MG capsule Commonly known as:  MEXITIL Take 1 capsule (150 mg total) by mouth 2 (two) times daily. Notes to patient:  6/2   milrinone 20 MG/100 ML Soln infusion Commonly known as:  PRIMACOR Inject 0.013 mg/min into the  vein continuous.   nitroGLYCERIN 0.4 MG SL tablet Commonly known as:  NITROSTAT Place 1 tablet (0.4 mg total) under the tongue every 5 (five) minutes as needed for chest pain.   omeprazole 40 MG capsule Commonly known as:  PRILOSEC Take 40 mg by mouth daily.   PEPCID PO Take 1 tablet by mouth 3 (three) times daily as needed (itching).   polyethylene glycol 17 g packet Commonly known as:  MIRALAX / GLYCOLAX Take 17 g by mouth at bedtime.   potassium chloride SA 20 MEQ tablet Commonly known as:  K-DUR Take 2 tablets (40 mEq total) by mouth 2 (two) times daily. What changed:    when to take this  reasons to take this  additional instructions   ProAir HFA 108 (90 Base) MCG/ACT inhaler Generic drug:  albuterol Inhale 2 puffs into the lungs every 6 (six) hours as needed for wheezing.   rosuvastatin 5 MG tablet Commonly known as:  CRESTOR Take 1 tablet by mouth on Monday, Wednesday, and Friday What changed:    how much to take  how to take this  when to take this  additional instructions   spironolactone 25 MG tablet Commonly known as:  ALDACTONE TAKE 1 TABLET BY MOUTH EVERY DAY   Symbicort 160-4.5 MCG/ACT inhaler Generic drug:  budesonide-formoterol Inhale 2 puffs into the lungs daily.   torsemide 20 MG tablet Commonly known as:  DEMADEX Taking 80 mg in am/60 mg in pm What changed:  additional instructions   Tylenol Arthritis Pain 650 MG CR tablet Generic drug:  acetaminophen Take 650 mg by mouth every 8 (eight) hours as needed for pain.            Durable Medical Equipment  (From admission, onward)         Start     Ordered   05/09/19 1257  Heart failure home health orders  (Heart failure home health orders / Face to face)  Once    Comments:  Heart Failure Follow-up Care:  Verify follow-up appointments per Patient Discharge Instructions. Confirm transportation arranged. Reconcile home medications with discharge medication list. Remove  discontinued medications from use. Assist patient/caregiver to manage medications using pill box. Reinforce low sodium food selection Assessments: Vital signs and oxygen saturation at each visit. Assess home environment for safety concerns, caregiver support and availability of low-sodium foods. Consult Education officer, museum, PT/OT, Dietitian, and CNA based on assessments. Perform comprehensive cardiopulmonary assessment. Notify MD for any change in condition or weight gain of 3 pounds in one day or 5 pounds in one week with symptoms. Daily Weights and Symptom Monitoring: Ensure patient has access to scales. Teach patient/caregiver to weigh daily before breakfast and after voiding using same scale and record.    Teach patient/caregiver to track weight and symptoms and when to notify Provider. Activity: Develop individualized activity plan with patient/caregiver.   Resumption of care for home milrinone.  Check BMET 6/5 and 6/ 09/2019 with result faxed to HF clinic  Question Answer Comment  Heart Failure Follow-up Care Advanced Heart Failure (AHF) Clinic at [phone]   Obtain the following labs Other see comments   Lab frequency Other see comments   Fax lab results to AHF Clinic at [phone]   Diet Low Sodium Heart Healthy   Fluid restrictions: 2000 mL Fluid      05/09/19 1259          Disposition   The patient will be discharged in stable condition to home. Discharge Instructions    (HEART FAILURE PATIENTS) Call MD:  Anytime you have any of the following symptoms: 1) 3 pound weight gain in 24 hours or 5 pounds in 1 week 2) shortness of breath, with or without a dry hacking cough 3)  swelling in the hands, feet or stomach 4) if you have to sleep on extra pillows at night in order to breathe.   Complete by:  As directed    Diet - low sodium heart healthy   Complete by:  As directed    Heart Failure patients record your daily weight using the same scale at the same time of day    Complete by:  As directed    Increase activity slowly   Complete by:  As directed      Follow-up Information    Patsey Berthold, NP Follow up.   Specialty:  Cardiology Why:  06/05/2019 @ 9:20AM, device check/visit Contact information: [place] [address] [phone]        Jolaine Artist, MD On 05/17/2019.   Specialty:  Cardiology Why:  at [place] information: [place] [address] [phone]             Duration of Discharge Encounter: Greater than 35 minutes   Signed, Darrick Grinder NP-C  05/09/2019, 1:05 PM   Patient seen and examined with the above-signed Advanced Practice Provider and/or Housestaff. I personally reviewed laboratory data, imaging studies and relevant notes. I independently examined the patient and formulated the important aspects of the plan. I have edited the note to reflect any of my changes or salient points. I have personally discussed the plan with the patient and/or family.  She is stable for d/c today. Follow electrolytes closely. No driving for 6 months. D/w Terri. Rayann Heman and they will plan CRT upgrade in near future.   Glori Bickers, MD  1:55 PM

## 2019-05-09 NOTE — TOC Transition Note (Signed)
Transition of Care [facility]) - CM/SW Discharge Note   Patient Details  Name: MELANNI BENWAY MRN: [id number] Date of Birth: 1951/11/20  Transition of Care [facility]) CM/SW Contact:  Sherrilyn Rist Phone Number: [phone] 05/09/2019, 1:07 PM   Clinical Narrative:    Patient is to be discharged home today with [facility] services provided by [facility] for IV Milrinone drip as prior to admission; [name] RN with [facility] made aware of discharge home today.   Final next level of care: [place] Barriers to Discharge: No Barriers Identified   Patient Goals and CMS Choice Patient states their goals for this hospitalization and ongoing recovery are:: to stay at home CMS Medicare.gov Compare Post Acute Care list provided to:: Patient Choice offered to / list presented to : NA  Discharge Placement                       Discharge Plan and Services In-house Referral: NA Discharge Planning Services: CM Consult Post Acute Care Choice: Resumption of Svcs/PTA Provider                    HH Arranged: RN [place] Agency: [place] ([facility])        Social Determinants of Health (SDOH) Interventions     Readmission Risk Interventions No flowsheet data found.

## 2019-05-10 ENCOUNTER — Encounter: Payer: Self-pay | Admitting: Cardiology

## 2019-05-10 NOTE — Progress Notes (Signed)
Remote ICD transmission.   

## 2019-05-11 ENCOUNTER — Other Ambulatory Visit (HOSPITAL_COMMUNITY): Payer: Self-pay | Admitting: Internal Medicine

## 2019-05-11 ENCOUNTER — Other Ambulatory Visit (HOSPITAL_COMMUNITY)
Admission: AD | Admit: 2019-05-11 | Discharge: 2019-05-11 | Disposition: A | Discharge status: Home / Self Care | Payer: Medicare Other | Source: Skilled Nursing Facility | Attending: Internal Medicine | Admitting: Internal Medicine

## 2019-05-11 DIAGNOSIS — I5022 Chronic systolic (congestive) heart failure: Secondary | ICD-10-CM | POA: Diagnosis present

## 2019-05-11 LAB — BASIC METABOLIC PANEL
Anion gap: 11 (ref 5–15)
BUN: 52 mg/dL — ABNORMAL HIGH (ref 8–23)
CO2: 25 mmol/L (ref 22–32)
Calcium: 9.4 mg/dL (ref 8.9–10.3)
Chloride: 100 mmol/L (ref 98–111)
Creatinine, Ser: 2.07 mg/dL — ABNORMAL HIGH (ref 0.44–1.00)
GFR calc Af Amer: 27 mL/min — ABNORMAL LOW (ref >60)
GFR calc non Af Amer: 24 mL/min — ABNORMAL LOW (ref >60)
Glucose, Bld: 106 mg/dL — ABNORMAL HIGH (ref 70–99)
Potassium: 5 mmol/L (ref 3.5–5.1)
Sodium: 136 mmol/L (ref 135–145)

## 2019-05-15 ENCOUNTER — Ambulatory Visit (INDEPENDENT_AMBULATORY_CARE_PROVIDER_SITE_OTHER): Payer: Medicare Other

## 2019-05-15 DIAGNOSIS — Z95810 Presence of automatic (implantable) cardiac defibrillator: Secondary | ICD-10-CM | POA: Diagnosis not present

## 2019-05-15 DIAGNOSIS — I5022 Chronic systolic (congestive) heart failure: Secondary | ICD-10-CM

## 2019-05-16 ENCOUNTER — Telehealth (HOSPITAL_COMMUNITY): Payer: Self-pay | Admitting: *Deleted

## 2019-05-16 MED ORDER — POTASSIUM CHLORIDE CRYS ER 20 MEQ PO TBCR: 40.0000 meq | EXTENDED_RELEASE_TABLET | Freq: Every day | ORAL | 6 refills | Status: AC

## 2019-05-16 NOTE — Telephone Encounter (Signed)
Received call from [facility], pt's K came back as 5.0 and she wanted to know if we need to make changes.  Discussed w/Amy Ninfa Meeker, NP she advised pt should decrease KCL to 40 meq DAILY  HHRN is aware and will advise pt, med list updated

## 2019-05-16 NOTE — Progress Notes (Signed)
EPIC Encounter for ICM Monitoring  Patient Name: [name] is a 68 y.o. female Date: 05/16/2019 Primary Care Physican: Stephens Shire, MD Primary Cardiologist: Nishan/Bensimhon Electrophysiologist: Allred 04/27/2019 Weight: 116 lbs  Battery: 2.64 V  Transmission reviewed.  Patient hospitalized 5/31 - 6/2 and has follow office appointment with Dr Haroldine Laws tomorrow 05/17/2019.  Optivol Thoracic impedance at baseline normal 6/8 but was abnormal suggesting fluid accumulation 6/1 to 6/8.   Prescribed:Torsemide20 mg take4tablets(80 mg total) by mouthevery morning and 3 tablets (60 mg total) every evening. Potassium 20 mEqtake 2 tablets (40 mEq total) by mouth twice a day.  Metolazone 2.5 mgTake 1 tablet (2.5 mg total) by mouth as needed for up to 10 days.  Labs: 05/11/2019 Creatinine 2.07, BUN 52, Potassium 5.0, Sodium 136, GFR 24-27 05/09/2019 Creatinine 1.93, BUN 52, Potassium 3.6, Sodium 134, GFR 26-30  05/08/2019 Creatinine 2.05, BUN 58, Potassium 3.8, Sodium 131, GFR 24-28  05/07/2019 Creatinine 2.17, BUN 51, Potassium 3.3, Sodium 131, GFR 22-26  03/24/2019 Creatinine 1.53, BUN 47, Potassium 4.0, Sodium 135, GFR 34-39 03/10/2019 Creatinine 1.60, BUN 36, Potassium 4.1, Sodium 138, GFR 43-50 02/24/2019 Creatinine 1.39, BUN 28, Potassium 4.3, Sodium 137, GFR 38-44 A complete set of results can be found in Results Review.  Recommendations: None.   Follow-up plan: ICM clinic phone appointment on7/13/2020.Visit with Dr Rayann Heman 05/19/2019.  Appointment 05/17/2019 with Dr Haroldine Laws.   Copy of ICM check sent to Dr.Allred.  3 month ICM trend: 05/15/2019    1 Year ICM trend:       Rosalene Billings, RN 05/16/2019 9:26 AM

## 2019-05-17 ENCOUNTER — Encounter (HOSPITAL_COMMUNITY): Payer: Self-pay | Admitting: Internal Medicine

## 2019-05-17 ENCOUNTER — Other Ambulatory Visit: Payer: Self-pay

## 2019-05-17 ENCOUNTER — Ambulatory Visit (HOSPITAL_COMMUNITY)
Admission: RE | Admit: 2019-05-17 | Discharge: 2019-05-17 | Disposition: A | Discharge status: Home / Self Care | Payer: Medicare Other | Source: Ambulatory Visit | Attending: Internal Medicine | Admitting: Internal Medicine

## 2019-05-17 VITALS — BP 98/64 | HR 96 | Wt 117.0 lb

## 2019-05-17 DIAGNOSIS — Z880 Allergy status to penicillin: Secondary | ICD-10-CM | POA: Insufficient documentation

## 2019-05-17 DIAGNOSIS — E876 Hypokalemia: Secondary | ICD-10-CM | POA: Insufficient documentation

## 2019-05-17 DIAGNOSIS — I739 Peripheral vascular disease, unspecified: Secondary | ICD-10-CM | POA: Diagnosis not present

## 2019-05-17 DIAGNOSIS — I255 Ischemic cardiomyopathy: Secondary | ICD-10-CM | POA: Insufficient documentation

## 2019-05-17 DIAGNOSIS — Z888 Allergy status to other drugs, medicaments and biological substances status: Secondary | ICD-10-CM | POA: Insufficient documentation

## 2019-05-17 DIAGNOSIS — Z91012 Allergy to eggs: Secondary | ICD-10-CM | POA: Diagnosis not present

## 2019-05-17 DIAGNOSIS — I6523 Occlusion and stenosis of bilateral carotid arteries: Secondary | ICD-10-CM | POA: Diagnosis not present

## 2019-05-17 DIAGNOSIS — J449 Chronic obstructive pulmonary disease, unspecified: Secondary | ICD-10-CM | POA: Diagnosis not present

## 2019-05-17 DIAGNOSIS — Z955 Presence of coronary angioplasty implant and graft: Secondary | ICD-10-CM | POA: Insufficient documentation

## 2019-05-17 DIAGNOSIS — I472 Ventricular tachycardia, unspecified: Secondary | ICD-10-CM

## 2019-05-17 DIAGNOSIS — M19049 Primary osteoarthritis, unspecified hand: Secondary | ICD-10-CM | POA: Insufficient documentation

## 2019-05-17 DIAGNOSIS — Z7951 Long term (current) use of inhaled steroids: Secondary | ICD-10-CM | POA: Insufficient documentation

## 2019-05-17 DIAGNOSIS — F17210 Nicotine dependence, cigarettes, uncomplicated: Secondary | ICD-10-CM | POA: Insufficient documentation

## 2019-05-17 DIAGNOSIS — E785 Hyperlipidemia, unspecified: Secondary | ICD-10-CM | POA: Diagnosis not present

## 2019-05-17 DIAGNOSIS — I493 Ventricular premature depolarization: Secondary | ICD-10-CM

## 2019-05-17 DIAGNOSIS — I130 Hypertensive heart and chronic kidney disease with heart failure and stage 1 through stage 4 chronic kidney disease, or unspecified chronic kidney disease: Secondary | ICD-10-CM | POA: Insufficient documentation

## 2019-05-17 DIAGNOSIS — I5022 Chronic systolic (congestive) heart failure: Secondary | ICD-10-CM

## 2019-05-17 DIAGNOSIS — Z95810 Presence of automatic (implantable) cardiac defibrillator: Secondary | ICD-10-CM | POA: Diagnosis not present

## 2019-05-17 DIAGNOSIS — N183 Chronic kidney disease, stage 3 (moderate): Secondary | ICD-10-CM | POA: Insufficient documentation

## 2019-05-17 DIAGNOSIS — Z7982 Long term (current) use of aspirin: Secondary | ICD-10-CM | POA: Diagnosis not present

## 2019-05-17 DIAGNOSIS — N179 Acute kidney failure, unspecified: Secondary | ICD-10-CM | POA: Diagnosis not present

## 2019-05-17 DIAGNOSIS — I2510 Atherosclerotic heart disease of native coronary artery without angina pectoris: Secondary | ICD-10-CM

## 2019-05-17 DIAGNOSIS — I252 Old myocardial infarction: Secondary | ICD-10-CM | POA: Insufficient documentation

## 2019-05-17 DIAGNOSIS — Z803 Family history of malignant neoplasm of breast: Secondary | ICD-10-CM | POA: Insufficient documentation

## 2019-05-17 DIAGNOSIS — F329 Major depressive disorder, single episode, unspecified: Secondary | ICD-10-CM | POA: Insufficient documentation

## 2019-05-17 DIAGNOSIS — Z833 Family history of diabetes mellitus: Secondary | ICD-10-CM | POA: Insufficient documentation

## 2019-05-17 DIAGNOSIS — Z79899 Other long term (current) drug therapy: Secondary | ICD-10-CM | POA: Insufficient documentation

## 2019-05-17 LAB — BASIC METABOLIC PANEL
Anion gap: 14 (ref 5–15)
BUN: 42 mg/dL — ABNORMAL HIGH (ref 8–23)
CO2: 21 mmol/L — ABNORMAL LOW (ref 22–32)
Calcium: 9.5 mg/dL (ref 8.9–10.3)
Chloride: 98 mmol/L (ref 98–111)
Creatinine, Ser: 2.33 mg/dL — ABNORMAL HIGH (ref 0.44–1.00)
GFR calc Af Amer: 24 mL/min — ABNORMAL LOW (ref >60)
GFR calc non Af Amer: 21 mL/min — ABNORMAL LOW (ref >60)
Glucose, Bld: 92 mg/dL (ref 70–99)
Potassium: 4.2 mmol/L (ref 3.5–5.1)
Sodium: 133 mmol/L — ABNORMAL LOW (ref 135–145)

## 2019-05-17 LAB — BRAIN NATRIURETIC PEPTIDE: B Natriuretic Peptide: 655.9 pg/mL — ABNORMAL HIGH (ref 0.0–100.0)

## 2019-05-17 NOTE — Progress Notes (Signed)
Advanced Heart Failure Clinic Note   ID:  [name], [name] 01-12-1951, MRN [id number]  Location: Home  Provider location: [address], [place] Type of Visit: Established patient   PCP:  Stephens Shire, MD  Cardiologist:  Jenkins Rouge, MD Primary HF: Dr Haroldine Laws  Chief Complaint: HF follow up   History of Present Illness: Terri Abrell Biggsis a 68 y.o.femalewith a history of CAD, s/p prior Ant MI, s/p prior POBA to LAD, Dx, RCA, CHF due to ICM with high scar burden by MRI (12/09), s/p ICD, HTN, HL, depression,andtobacco abuse.   Admitted 7/10 through 06/28/2018. Admitted with increase dyspnea and and abdominal pain. Required short term milrinone and diuresed with IV lasix. Transitioned to torsemide 40 mg daily. GI consulted for abdominal pain. HIDDA scan was negative. Also had 20% PVCs so mexiletine was started. Discharge weight 115 pounds.   CPX 9/19 severe HF limitation with pVO2 14.2 and slope 40.   She underwent RHC on 12/22/18 notable for low output with MV sat 47% and CI 1.7. PCWP 20. She was admitted and started on milrinone. Druing that admit also underwent PFTs with further decrease in DLCO.  Echo 01/20/19: EF 15-20%, RV normal, mod MR   Admitted 05/07/19 to 05/09/19 in setting of VT with ICD firing due to hypokalemia (k 3.3) after torsemide increased to 80/60. Electrolytes supplemented. Torsemide continued at 80/60 and K increased to 40 bid. F/u labs on 6/4 with K 5.0 and Cr 2/07 (up from 1.9 recently. Baseline about 1.7). K decreased from 40 bid to daily.   Here for post-hospital f/u. Remains on milrinone 0.25.  Had stomach virus when she got home from hospital. Feels weak. Chronic SOB with daily activities. Har to do much.  Weight up about 2 pounds. Taking torsemide 80/60. Trying to watch fluid intake.   ICD interrogation: No further VT. Fluid trending back up. Activity < 1hr  Pt denies symptoms of cough, fevers, chills, or new SOB worrisome for COVID  19.    Past Medical History:  Diagnosis Date  . Arthritis    hands  . Asthma   . CAD (coronary artery disease)    a. s/p prior Ant MI, b. s/p prior POBA to LAD, Dx, RCA,;  c.  LHC (10/12):  dLAD 40, pCFX 30, OM1 50, pRCA 30;  d.  [facility] Adam Myoview (11/14):  High risk study; inferior, anterior, apical defects; minimal reversibility toward the apex; consistent with prior infarct and very mild peri-infarct ischemia, EF 24%, inferior/apical/anterior akinesis  . CHF (congestive heart failure) ([place])   . Chronic systolic heart failure ([place])   . CKD (chronic kidney disease), stage III ([place])   . Depression   . H/O hiatal hernia   . HLD (hyperlipidemia)   . HTN (hypertension)   . Ischemic cardiomyopathy    MRI (12/09):  High scar burden, Inf and Apical AK, EF 24%.;  Echo (10/14): EF 15%, Gr 2 DD, mild to mod MR, mod LAE, RVSF mildly reduced, mod RAE, severe TR, PASP 49-53 (mod pulmonary HTN)  . MI (myocardial infarction) ([place])   . Tobacco abuse    Past Surgical History:  Procedure Laterality Date  . ABDOMINAL AORTOGRAM W/LOWER EXTREMITY N/A 03/02/2018   Procedure: ABDOMINAL AORTOGRAM W/LOWER EXTREMITY;  Surgeon: Wellington Hampshire, MD;  Location: [facility];  Service: Cardiovascular;  Laterality: N/A;  . CARDIAC DEFIBRILLATOR PLACEMENT  01/18/2009   MDT single chamber ICD implanted by Dr Rayann Heman   .  cardiac stents    . COLONOSCOPY  03/18/2012   Procedure: COLONOSCOPY;  Surgeon: Beryle Beams, MD;  Location: [facility];  Service: Endoscopy;  Laterality: N/A;  . CORONARY ANGIOPLASTY    . ESOPHAGOGASTRODUODENOSCOPY N/A 03/10/2013   Procedure: ESOPHAGOGASTRODUODENOSCOPY (EGD);  Surgeon: Beryle Beams, MD;  Location: [facility];  Service: Endoscopy;  Laterality: N/A;  . IR FLUORO GUIDE CV LINE RIGHT  12/23/2018  . IR US GUIDE VASC ACCESS RIGHT  12/23/2018  . LEFT HEART CATHETERIZATION WITH CORONARY ANGIOGRAM N/A 07/26/2014   Procedure: LEFT HEART CATHETERIZATION WITH CORONARY ANGIOGRAM;   Surgeon: Jolaine Artist, MD;  [facility]: [facility];  Service: Cardiovascular;  Laterality: N/A;  . RIGHT HEART CATH N/A 12/22/2018   Procedure: RIGHT HEART CATH;  Surgeon: Jolaine Artist, MD;  Location: [facility];  Service: Cardiovascular;  Laterality: N/A;  . RIGHT/LEFT HEART CATH AND CORONARY ANGIOGRAPHY N/A 06/22/2018   Procedure: RIGHT/LEFT HEART CATH AND CORONARY ANGIOGRAPHY;  Surgeon: Larey Dresser, MD;  Location: [facility];  Service: Cardiovascular;  Laterality: N/A;     Current Outpatient Medications  Medication Sig Dispense Refill  . acetaminophen (TYLENOL ARTHRITIS PAIN) 650 MG CR tablet Take 650 mg by mouth every 8 (eight) hours as needed for pain.     Marland Kitchen albuterol (PROAIR HFA) 108 (90 Base) MCG/ACT inhaler Inhale 2 puffs into the lungs every 6 (six) hours as needed for wheezing.     Marland Kitchen aspirin EC 81 MG tablet Take 81 mg by mouth daily.    Marland Kitchen azelastine (ASTELIN) 0.1 % nasal spray Place 1 spray into both nostrils daily.     . budesonide-formoterol (SYMBICORT) 160-4.5 MCG/ACT inhaler Inhale 2 puffs into the lungs daily.     . citalopram (CELEXA) 20 MG tablet Take 20 mg by mouth at bedtime.     . clonazePAM (KLONOPIN) 0.5 MG tablet Take 0.5 mg by mouth 2 (two) times daily as needed for anxiety.     Marland Kitchen ezetimibe (ZETIA) 10 MG tablet TAKE 1/2 TABLET TWICE A WEEK (Patient taking differently: Take 5 mg by mouth 2 (two) times a week. Saturday and Sunday) 15 tablet 3  . Famotidine (PEPCID PO) Take 1 tablet by mouth 3 (three) times daily as needed (itching).    . fish oil-omega-3 fatty acids 1000 MG capsule Take 2 g by mouth at bedtime.     . fluticasone (FLONASE) 50 MCG/ACT nasal spray Place 2 sprays into both nostrils at bedtime.     . hydrocortisone cream 1 % Apply 1 application topically 4 (four) times daily as needed for itching.    . isosorbide mononitrate (IMDUR) 30 MG 24 hr tablet TAKE 1 TABLET BY MOUTH EVERY DAY (Patient taking differently: Take 30 mg by  mouth at bedtime. ) 90 tablet 3  . loratadine (CLARITIN) 10 MG tablet Take 10 mg by mouth daily as needed (seasonal allergies).     . losartan (COZAAR) 25 MG tablet TAKE 0.5 TABLETS (12.5 MG TOTAL) BY MOUTH 2 (TWO) TIMES DAILY. 60 tablet 11  . metolazone (ZAROXOLYN) 2.5 MG tablet Take 1 tablet (2.5 mg total) by mouth as needed for up to 10 days. (Patient taking differently: Take 2.5 mg by mouth every Friday. ) 10 tablet 0  . mexiletine (MEXITIL) 150 MG capsule Take 1 capsule (150 mg total) by mouth 2 (two) times daily. 60 capsule 11  . milrinone (PRIMACOR) 20 MG/100 ML SOLN infusion Inject 0.013 mg/min into the vein continuous. 100 mL  11  . nitroGLYCERIN (NITROSTAT) 0.4 MG SL tablet Place 1 tablet (0.4 mg total) under the tongue every 5 (five) minutes as needed for chest pain. 25 tablet 3  . omeprazole (PRILOSEC) 40 MG capsule Take 40 mg by mouth daily.     . polyethylene glycol (MIRALAX / GLYCOLAX) packet Take 17 g by mouth at bedtime.    . potassium chloride SA (K-DUR) 20 MEQ tablet Take 2 tablets (40 mEq total) by mouth daily. 120 tablet 6  . rosuvastatin (CRESTOR) 5 MG tablet Take 1 tablet by mouth on Monday, Wednesday, and Friday (Patient taking differently: Take 5 mg by mouth every Monday, Wednesday, and Friday. ) 45 tablet 3  . spironolactone (ALDACTONE) 25 MG tablet TAKE 1 TABLET BY MOUTH EVERY DAY (Patient taking differently: Take 25 mg by mouth daily. ) 30 tablet 3  . torsemide (DEMADEX) 20 MG tablet Taking 80 mg in am/60 mg in pm 360 tablet 3   No current facility-administered medications for this encounter.     Allergies:   Prednisone; Zebeta [bisoprolol fumarate]; Eggs or egg-derived products; Penicillins; and Statins   Social History:  The patient  reports that she has been smoking cigarettes. She has smoked for the past 40.00 years. She has never used smokeless tobacco. She reports that she does not drink alcohol or use drugs.   Family History:  The patient's family history  includes Breast cancer in her sister; Diabetes Mellitus II in her sister; Stroke in her mother.   ROS:  Please see the history of present illness.   All other systems are personally reviewed and negative.    Exam: General:  Weak appearing. No resp difficulty HEENT: normal Neck: supple. no JVD. Carotids 2+ bilat; no bruits. No lymphadenopathy or thryomegaly appreciated. Cor: PMI nondisplaced. Regular rate & rhythm. No rubs, gallops or murmurs. Tunneled PICC site ok  Lungs: clear with decreased BS throughout Abdomen: soft, nontender, nondistended. No hepatosplenomegaly. No bruits or masses. Good bowel sounds. Extremities: no cyanosis, clubbing, rash, edema Neuro: alert & orientedx3, cranial nerves grossly intact. moves all 4 extremities w/o difficulty. Affect pleasant   Recent Labs: 10/11/2018: TSH 2.586 05/07/2019: ALT 19; Hemoglobin 11.7; Magnesium 2.1; Platelets 194 05/08/2019: B Natriuretic Peptide 1,026.9 05/11/2019: BUN 52; Creatinine, Ser 2.07; Potassium 5.0; Sodium 136  Personally reviewed   Wt Readings from Last 3 Encounters:  05/09/19 52.2 kg (115 lb 1.6 oz)  04/27/19 52.6 kg (116 lb)  01/20/19 55.2 kg (121 lb 9.6 oz)      ASSESSMENT AND PLAN:  1. Chronic Systolic CHF due to ICM with mild RV dysfunction s/p [employer] ICD.  - Echo 06/2018 EF 15% - Echo2/14/20EF 15-20%, RV normal, mod MR - CPX 9/19 with severe functional limitation due predominantly to HF. - RHC 1/20 with low output. CI 1.7. Started on home milrinone at 0.25 - Remains NYHA IIIB-IV on home milrinone0.25. - Volume statuslooks ok on exam but mildly elevated by Optivol despite recent increase in torsemide to 80/60. Will continue torsemide. Take metolazone 2.5 as needed but if she takes it has to take Kcl 40.  - Repeat labs today to check K and renal function have to follow very closely with recent VT with hypokalemia  - Continue digoxin 0.0625 mg daily.Dig level 0.7 4/24.  - Continue losartan 12.5  mgBID.  - Continue spironolactone 25 mg daily. - Does not tolerate beta blockers due to symptomatic brady in past. - She has stopped smoking. Repeat PFTs and DLCOwhen able (delayed due to  COVID). - Overall continues to deteriorate despite inotropic support. I d/w Dr. Prescott Gum and he feels she is likely not suitable VAD candidate at this point. (I agree). Will see what PFTs show. She ahs visit with Dr. Rayann Heman this week to see if CRT upgrade might help her symptomatically. Typically I dont feel this will help much at this stage but with ned to replace ICD may be reasonable to consider as a last option to help her.   2. VT with ICD firing in setting of hypokalemia on 05/06/19 - ICD interrogated personally today. No recurrent events. No driving for 6 months - Watch K closely - Repeat labs today  3. CAD s/p previous anterior MI: Cath 8/15 nonobstructive CAD.  - Cath 7/19 non-obstructive CAD.  - Continue ASA and statin. Continue imdur.  - No s/s ischemia.  4. COPD with ongoing tobacco abuse:  -Stopped smoking 12/2718 . Using nicotine gum.Remains off cigarettes. See above re: PFTs  5. AKI with CKD 3 - repeat labs today - baseline ~ 1.6  6. PAD with Intermittent claudication: Medical therapy.  - Moderate bilateral calf claudication worse on the left side due to known SFA disease. This has progressed over past few months  - Had peripheral vascular catheterization 03/02/18 with no significant aortoiliac disease. Diffuse 60-70% disease affecting the left proximal and mid SFA with short occlusion distally with reconstitution via collaterals from the profunda. Three-vessel runoff below the knee.  - Continue Risk factor management and smoking cessation.No change.   7. Bilateral carotid stenosis - Followed by Dr. Johnsie Cancel.No change.   8. Hyperlipidemia with intolerance of multiple cholesterol meds: - Tolerating Crestor 5 mg three times/week and Zetia 5mg  two times weekly.No change.    9.  PVCs - Continue mexilitene 150 mg BID.Denies palpitations.No change.   Signed, Glori Bickers, MD  05/17/2019 9:39 AM   [facility] [address] [facility] [place] [address] [phone] (office) [phone] (fax)

## 2019-05-17 NOTE — Patient Instructions (Signed)
Labs done today  Your physician has recommended that you have a pulmonary function test. Pulmonary Function Tests are a group of tests that measure how well air moves in and out of your lungs.   WE WILL TRY TO SCHEDULES THEM SOON, [place] IS NOT CURRENTLY PERFORMING UNLESS EMERGENT DUE TO COVID-19 RESTRICTIONS.  Your physician recommends that you schedule a follow-up appointment in: 6 weeks

## 2019-05-18 ENCOUNTER — Telehealth (HOSPITAL_COMMUNITY): Payer: Self-pay | Admitting: *Deleted

## 2019-05-18 NOTE — Telephone Encounter (Signed)
[facility] orders signed and faxed to [facility]

## 2019-05-19 ENCOUNTER — Telehealth (INDEPENDENT_AMBULATORY_CARE_PROVIDER_SITE_OTHER): Payer: Medicare Other | Admitting: Internal Medicine

## 2019-05-19 ENCOUNTER — Encounter: Payer: Self-pay | Admitting: Internal Medicine

## 2019-05-19 VITALS — Ht 63.0 in | Wt 114.0 lb

## 2019-05-19 DIAGNOSIS — I472 Ventricular tachycardia: Secondary | ICD-10-CM | POA: Diagnosis not present

## 2019-05-19 DIAGNOSIS — I5022 Chronic systolic (congestive) heart failure: Secondary | ICD-10-CM | POA: Diagnosis not present

## 2019-05-19 DIAGNOSIS — I255 Ischemic cardiomyopathy: Secondary | ICD-10-CM | POA: Diagnosis not present

## 2019-05-19 DIAGNOSIS — I259 Chronic ischemic heart disease, unspecified: Secondary | ICD-10-CM

## 2019-05-19 DIAGNOSIS — I2510 Atherosclerotic heart disease of native coronary artery without angina pectoris: Secondary | ICD-10-CM

## 2019-05-19 NOTE — Progress Notes (Signed)
Electrophysiology TeleHealth Note   Due to national recommendations of social distancing due to [place] 19, an audio telehealth visit is felt to be most appropriate for this patient at this time.  Verbal consent was obtained by me for the telehealth visit today.  The patient does not have capability for a virtual visit.  A phone visit is therefore required today.   Date:  05/19/2019   ID:  [name], DOB [birth year], MRN [id number]  Location: patient's home  Provider location:  [facility]  Evaluation Performed: Follow-up visit  PCP:  Stephens Shire, MD   Electrophysiologist:  Dr Rayann Heman  Chief Complaint:  HF follow up  History of Present Illness:    [name] is a 68 y.o. female who presents via telehealth conferencing today.  Since last being seen in our clinic, the patient reports doing relatively well.  She continues to be followed closely in the AHF clinic.  Today, she denies symptoms of palpitations, chest pain, shortness of breath (above baseline),  lower extremity edema, dizziness, presyncope, or syncope.  The patient is otherwise without complaint today.  The patient denies symptoms of fevers, chills, cough, or new SOB worrisome for COVID 19.  Past Medical History:  Diagnosis Date  . Arthritis    hands  . Asthma   . CAD (coronary artery disease)    a. s/p prior Ant MI, b. s/p prior POBA to LAD, Dx, RCA,;  c.  LHC (10/12):  dLAD 40, pCFX 30, OM1 50, pRCA 30;  d.  [facility] Adam Myoview (11/14):  High risk study; inferior, anterior, apical defects; minimal reversibility toward the apex; consistent with prior infarct and very mild peri-infarct ischemia, EF 24%, inferior/apical/anterior akinesis  . CHF (congestive heart failure) ([place])   . Chronic systolic heart failure ([place])   . CKD (chronic kidney disease), stage III ([place])   . Depression   . H/O hiatal hernia   . HLD (hyperlipidemia)   . HTN (hypertension)   . Ischemic cardiomyopathy    MRI (12/09):  High scar  burden, Inf and Apical AK, EF 24%.;  Echo (10/14): EF 15%, Gr 2 DD, mild to mod MR, mod LAE, RVSF mildly reduced, mod RAE, severe TR, PASP 49-53 (mod pulmonary HTN)  . MI (myocardial infarction) ([place])   . Tobacco abuse     Past Surgical History:  Procedure Laterality Date  . ABDOMINAL AORTOGRAM W/LOWER EXTREMITY N/A 03/02/2018   Procedure: ABDOMINAL AORTOGRAM W/LOWER EXTREMITY;  Surgeon: Wellington Hampshire, MD;  Location: [facility];  Service: Cardiovascular;  Laterality: N/A;  . CARDIAC DEFIBRILLATOR PLACEMENT  01/18/2009   MDT single chamber ICD implanted by Dr Rayann Heman   . cardiac stents    . COLONOSCOPY  03/18/2012   Procedure: COLONOSCOPY;  Surgeon: Beryle Beams, MD;  Location: [facility];  Service: Endoscopy;  Laterality: N/A;  . CORONARY ANGIOPLASTY    . ESOPHAGOGASTRODUODENOSCOPY N/A 03/10/2013   Procedure: ESOPHAGOGASTRODUODENOSCOPY (EGD);  Surgeon: Beryle Beams, MD;  Location: [facility];  Service: Endoscopy;  Laterality: N/A;  . IR FLUORO GUIDE CV LINE RIGHT  12/23/2018  . IR US GUIDE VASC ACCESS RIGHT  12/23/2018  . LEFT HEART CATHETERIZATION WITH CORONARY ANGIOGRAM N/A 07/26/2014   Procedure: LEFT HEART CATHETERIZATION WITH CORONARY ANGIOGRAM;  Surgeon: Jolaine Artist, MD;  Location: [facility] CATH LAB;  Service: Cardiovascular;  Laterality: N/A;  . RIGHT HEART CATH N/A 12/22/2018   Procedure: RIGHT HEART CATH;  Surgeon: Jolaine Artist, MD;  Location: [facility];  Service: Cardiovascular;  Laterality: N/A;  . RIGHT/LEFT HEART CATH AND CORONARY ANGIOGRAPHY N/A 06/22/2018   Procedure: RIGHT/LEFT HEART CATH AND CORONARY ANGIOGRAPHY;  Surgeon: Larey Dresser, MD;  Location: [facility];  Service: Cardiovascular;  Laterality: N/A;    Current Outpatient Medications  Medication Sig Dispense Refill  . acetaminophen (TYLENOL ARTHRITIS PAIN) 650 MG CR tablet Take 650 mg by mouth every 8 (eight) hours as needed for pain.     Marland Kitchen albuterol (PROAIR HFA) 108 (90  Base) MCG/ACT inhaler Inhale 2 puffs into the lungs every 6 (six) hours as needed for wheezing.     Marland Kitchen aspirin EC 81 MG tablet Take 81 mg by mouth daily.    Marland Kitchen azelastine (ASTELIN) 0.1 % nasal spray Place 1 spray into both nostrils daily.     . budesonide-formoterol (SYMBICORT) 160-4.5 MCG/ACT inhaler Inhale 2 puffs into the lungs daily.     . citalopram (CELEXA) 20 MG tablet Take 20 mg by mouth at bedtime.     . clonazePAM (KLONOPIN) 0.5 MG tablet Take 0.5 mg by mouth 2 (two) times daily as needed for anxiety.     Marland Kitchen ezetimibe (ZETIA) 10 MG tablet TAKE 1/2 TABLET TWICE A WEEK (Patient taking differently: Take 5 mg by mouth 2 (two) times a week. Saturday and Sunday) 15 tablet 3  . Famotidine (PEPCID PO) Take 1 tablet by mouth 3 (three) times daily as needed (itching).    . fish oil-omega-3 fatty acids 1000 MG capsule Take 2 g by mouth at bedtime.     . hydrocortisone cream 1 % Apply 1 application topically 4 (four) times daily as needed for itching.    . isosorbide mononitrate (IMDUR) 30 MG 24 hr tablet TAKE 1 TABLET BY MOUTH EVERY DAY 90 tablet 3  . loratadine (CLARITIN) 10 MG tablet Take 10 mg by mouth daily as needed (seasonal allergies).     . losartan (COZAAR) 25 MG tablet TAKE 0.5 TABLETS (12.5 MG TOTAL) BY MOUTH 2 (TWO) TIMES DAILY. 60 tablet 11  . metolazone (ZAROXOLYN) 2.5 MG tablet Take 1 tablet (2.5 mg total) by mouth as needed for up to 10 days. (Patient taking differently: Take 2.5 mg by mouth every Friday. ) 10 tablet 0  . mexiletine (MEXITIL) 150 MG capsule Take 1 capsule (150 mg total) by mouth 2 (two) times daily. 60 capsule 11  . milrinone (PRIMACOR) 20 MG/100 ML SOLN infusion Inject 0.013 mg/min into the vein continuous. 100 mL 11  . nitroGLYCERIN (NITROSTAT) 0.4 MG SL tablet Place 1 tablet (0.4 mg total) under the tongue every 5 (five) minutes as needed for chest pain. 25 tablet 3  . omeprazole (PRILOSEC) 40 MG capsule Take 40 mg by mouth daily.     . polyethylene glycol (MIRALAX /  GLYCOLAX) packet Take 17 g by mouth at bedtime.    . potassium chloride SA (K-DUR) 20 MEQ tablet Take 2 tablets (40 mEq total) by mouth daily. 120 tablet 6  . rosuvastatin (CRESTOR) 5 MG tablet Take 1 tablet by mouth on Monday, Wednesday, and Friday (Patient taking differently: Take 5 mg by mouth every Monday, Wednesday, and Friday. ) 45 tablet 3  . spironolactone (ALDACTONE) 25 MG tablet TAKE 1 TABLET BY MOUTH EVERY DAY 30 tablet 3  . torsemide (DEMADEX) 20 MG tablet Taking 80 mg in am/60 mg in pm 360 tablet 3   No current facility-administered medications for this visit.     Allergies:  Prednisone, Zebeta [bisoprolol fumarate], Eggs or egg-derived products, Penicillins, and Statins   Social History:  The patient  reports that she has quit smoking. Her smoking use included cigarettes. She quit after 40.00 years of use. She has never used smokeless tobacco. She reports that she does not drink alcohol or use drugs.   Family History:  The patient's family history includes Breast cancer in her sister; Diabetes Mellitus II in her sister; Stroke in her mother.   ROS:  Please see the history of present illness.   All other systems are personally reviewed and negative.    Exam:    Vital Signs:  Ht 5\' 3"  (1.6 m)   Wt 114 lb (51.7 kg)   BMI 20.19 kg/m   Well sounding today   Labs/Other Tests and Data Reviewed:    Recent Labs: 10/11/2018: TSH 2.586 05/07/2019: ALT 19; Hemoglobin 11.7; Magnesium 2.1; Platelets 194 05/17/2019: B Natriuretic Peptide 655.9; BUN 42; Creatinine, Ser 2.33; Potassium 4.2; Sodium 133   Wt Readings from Last 3 Encounters:  05/19/19 114 lb (51.7 kg)  05/17/19 117 lb (53.1 kg)  05/09/19 115 lb 1.6 oz (52.2 kg)     Last device remote is reviewed from [place] PDF.   ASSESSMENT & PLAN:    1.  Chronic systolic heart failure/CAD/ICM Followed closely by AHF clinic Dr Haroldine Laws has asked that we consider CRTD upgrade. She is not a VAD candidate. Risks, benefits to  CRTD upgrade reviewed with patient who wishes to proceed. Will plan at the next available time  2.  VT with appropriate ICD therapy In the setting of hypokalemia Recent labs reviewed No driving x6 months    Follow-up:  After CRTD upgrade    Patient Risk:  after full review of this patients clinical status, I feel that they are at moderate risk at this time.  Today, I have spent 25 minutes with the patient with telehealth technology discussing arrhythmia management .    Army Fossa, MD  05/19/2019 2:37 PM     [address] [place] [address] [phone] (office) [phone] (fax)

## 2019-05-23 ENCOUNTER — Other Ambulatory Visit (HOSPITAL_COMMUNITY): Payer: Self-pay | Admitting: Internal Medicine

## 2019-05-28 ENCOUNTER — Other Ambulatory Visit (HOSPITAL_COMMUNITY): Payer: Self-pay | Admitting: Internal Medicine

## 2019-05-30 ENCOUNTER — Other Ambulatory Visit: Payer: Self-pay | Admitting: Internal Medicine

## 2019-05-30 ENCOUNTER — Inpatient Hospital Stay (HOSPITAL_COMMUNITY)
Admission: AD | Admit: 2019-05-30 | Discharge: 2019-06-07 | DRG: 291 | Disposition: E | Payer: Medicare Other | Attending: Internal Medicine | Admitting: Internal Medicine

## 2019-05-30 ENCOUNTER — Telehealth (HOSPITAL_COMMUNITY): Payer: Self-pay | Admitting: *Deleted

## 2019-05-30 DIAGNOSIS — N179 Acute kidney failure, unspecified: Secondary | ICD-10-CM | POA: Diagnosis present

## 2019-05-30 DIAGNOSIS — Z8249 Family history of ischemic heart disease and other diseases of the circulatory system: Secondary | ICD-10-CM

## 2019-05-30 DIAGNOSIS — R55 Syncope and collapse: Secondary | ICD-10-CM | POA: Diagnosis present

## 2019-05-30 DIAGNOSIS — I130 Hypertensive heart and chronic kidney disease with heart failure and stage 1 through stage 4 chronic kidney disease, or unspecified chronic kidney disease: Principal | ICD-10-CM | POA: Diagnosis present

## 2019-05-30 DIAGNOSIS — Z803 Family history of malignant neoplasm of breast: Secondary | ICD-10-CM | POA: Diagnosis not present

## 2019-05-30 DIAGNOSIS — I5084 End stage heart failure: Secondary | ICD-10-CM | POA: Diagnosis present

## 2019-05-30 DIAGNOSIS — I255 Ischemic cardiomyopathy: Secondary | ICD-10-CM | POA: Diagnosis not present

## 2019-05-30 DIAGNOSIS — I2510 Atherosclerotic heart disease of native coronary artery without angina pectoris: Secondary | ICD-10-CM | POA: Diagnosis present

## 2019-05-30 DIAGNOSIS — I6523 Occlusion and stenosis of bilateral carotid arteries: Secondary | ICD-10-CM | POA: Diagnosis present

## 2019-05-30 DIAGNOSIS — Z1159 Encounter for screening for other viral diseases: Secondary | ICD-10-CM | POA: Diagnosis not present

## 2019-05-30 DIAGNOSIS — F419 Anxiety disorder, unspecified: Secondary | ICD-10-CM | POA: Diagnosis present

## 2019-05-30 DIAGNOSIS — R1084 Generalized abdominal pain: Secondary | ICD-10-CM | POA: Diagnosis not present

## 2019-05-30 DIAGNOSIS — F329 Major depressive disorder, single episode, unspecified: Secondary | ICD-10-CM | POA: Diagnosis present

## 2019-05-30 DIAGNOSIS — I252 Old myocardial infarction: Secondary | ICD-10-CM

## 2019-05-30 DIAGNOSIS — Z7189 Other specified counseling: Secondary | ICD-10-CM

## 2019-05-30 DIAGNOSIS — I5023 Acute on chronic systolic (congestive) heart failure: Secondary | ICD-10-CM | POA: Diagnosis present

## 2019-05-30 DIAGNOSIS — I509 Heart failure, unspecified: Secondary | ICD-10-CM | POA: Diagnosis present

## 2019-05-30 DIAGNOSIS — J449 Chronic obstructive pulmonary disease, unspecified: Secondary | ICD-10-CM | POA: Diagnosis present

## 2019-05-30 DIAGNOSIS — Z823 Family history of stroke: Secondary | ICD-10-CM

## 2019-05-30 DIAGNOSIS — N184 Chronic kidney disease, stage 4 (severe): Secondary | ICD-10-CM | POA: Diagnosis present

## 2019-05-30 DIAGNOSIS — I959 Hypotension, unspecified: Secondary | ICD-10-CM | POA: Diagnosis not present

## 2019-05-30 DIAGNOSIS — Z95810 Presence of automatic (implantable) cardiac defibrillator: Secondary | ICD-10-CM | POA: Diagnosis present

## 2019-05-30 DIAGNOSIS — Z66 Do not resuscitate: Secondary | ICD-10-CM | POA: Diagnosis present

## 2019-05-30 DIAGNOSIS — E876 Hypokalemia: Secondary | ICD-10-CM | POA: Diagnosis present

## 2019-05-30 DIAGNOSIS — Z7982 Long term (current) use of aspirin: Secondary | ICD-10-CM

## 2019-05-30 DIAGNOSIS — Z79899 Other long term (current) drug therapy: Secondary | ICD-10-CM

## 2019-05-30 DIAGNOSIS — Z833 Family history of diabetes mellitus: Secondary | ICD-10-CM | POA: Diagnosis not present

## 2019-05-30 DIAGNOSIS — Z888 Allergy status to other drugs, medicaments and biological substances status: Secondary | ICD-10-CM

## 2019-05-30 DIAGNOSIS — K559 Vascular disorder of intestine, unspecified: Secondary | ICD-10-CM | POA: Diagnosis present

## 2019-05-30 DIAGNOSIS — R570 Cardiogenic shock: Secondary | ICD-10-CM | POA: Diagnosis not present

## 2019-05-30 DIAGNOSIS — Z515 Encounter for palliative care: Secondary | ICD-10-CM | POA: Diagnosis not present

## 2019-05-30 DIAGNOSIS — I739 Peripheral vascular disease, unspecified: Secondary | ICD-10-CM | POA: Diagnosis present

## 2019-05-30 DIAGNOSIS — Z87891 Personal history of nicotine dependence: Secondary | ICD-10-CM

## 2019-05-30 DIAGNOSIS — E785 Hyperlipidemia, unspecified: Secondary | ICD-10-CM | POA: Diagnosis present

## 2019-05-30 LAB — PROTIME-INR
INR: 1.1 (ref 0.8–1.2)
Prothrombin Time: 14.2 seconds (ref 11.4–15.2)

## 2019-05-30 LAB — CBC
HCT: 33.6 % — ABNORMAL LOW (ref 36.0–46.0)
Hemoglobin: 11 g/dL — ABNORMAL LOW (ref 12.0–15.0)
MCH: 31.6 pg (ref 26.0–34.0)
MCHC: 32.7 g/dL (ref 30.0–36.0)
MCV: 96.6 fL (ref 80.0–100.0)
Platelets: 194 10*3/uL (ref 150–400)
RBC: 3.48 MIL/uL — ABNORMAL LOW (ref 3.87–5.11)
RDW: 13.8 % (ref 11.5–15.5)
WBC: 6.9 10*3/uL (ref 4.0–10.5)
nRBC: 0.3 % — ABNORMAL HIGH (ref 0.0–0.2)

## 2019-05-30 LAB — BASIC METABOLIC PANEL
Anion gap: 10 (ref 5–15)
BUN: 54 mg/dL — ABNORMAL HIGH (ref 8–23)
CO2: 25 mmol/L (ref 22–32)
Calcium: 9.3 mg/dL (ref 8.9–10.3)
Chloride: 99 mmol/L (ref 98–111)
Creatinine, Ser: 2.87 mg/dL — ABNORMAL HIGH (ref 0.44–1.00)
GFR calc Af Amer: 18 mL/min — ABNORMAL LOW (ref >60)
GFR calc non Af Amer: 16 mL/min — ABNORMAL LOW (ref >60)
Glucose, Bld: 128 mg/dL — ABNORMAL HIGH (ref 70–99)
Potassium: 4.9 mmol/L (ref 3.5–5.1)
Sodium: 134 mmol/L — ABNORMAL LOW (ref 135–145)

## 2019-05-30 LAB — COOXEMETRY PANEL
Carboxyhemoglobin: 1.2 % (ref 0.5–1.5)
Methemoglobin: 1 % (ref 0.0–1.5)
O2 Saturation: 40.8 %
Total hemoglobin: 11.2 g/dL — ABNORMAL LOW (ref 12.0–16.0)

## 2019-05-30 LAB — BRAIN NATRIURETIC PEPTIDE: B Natriuretic Peptide: 1314.5 pg/mL — ABNORMAL HIGH (ref 0.0–100.0)

## 2019-05-30 LAB — MRSA PCR SCREENING: MRSA by PCR: NEGATIVE

## 2019-05-30 LAB — SARS CORONAVIRUS 2 BY RT PCR (HOSPITAL ORDER, PERFORMED IN ~~LOC~~ HOSPITAL LAB): SARS Coronavirus 2: NEGATIVE

## 2019-05-30 MED ORDER — POTASSIUM CHLORIDE CRYS ER 20 MEQ PO TBCR
40.0000 meq | EXTENDED_RELEASE_TABLET | Freq: Every day | ORAL | Status: DC
  Administered 2019-05-31 – 2019-06-02 (×3): 40 meq via ORAL
  Filled 2019-05-30 (×3): qty 2

## 2019-05-30 MED ORDER — MOMETASONE FURO-FORMOTEROL FUM 200-5 MCG/ACT IN AERO
2.0000 | INHALATION_SPRAY | Freq: Two times a day (BID) | RESPIRATORY_TRACT | Status: DC
  Administered 2019-05-31 – 2019-06-03 (×7): 2 via RESPIRATORY_TRACT
  Filled 2019-05-30: qty 8.8

## 2019-05-30 MED ORDER — OMEGA-3 FATTY ACIDS 1000 MG PO CAPS: 2.0000 g | ORAL_CAPSULE | Freq: Every day | ORAL | Status: DC

## 2019-05-30 MED ORDER — EZETIMIBE 10 MG PO TABS: 5.0000 mg | ORAL_TABLET | ORAL | Status: DC

## 2019-05-30 MED ORDER — ALBUTEROL SULFATE (2.5 MG/3ML) 0.083% IN NEBU: 3.0000 mL | INHALATION_SOLUTION | Freq: Four times a day (QID) | RESPIRATORY_TRACT | Status: DC | PRN

## 2019-05-30 MED ORDER — NITROGLYCERIN 0.4 MG SL SUBL: 0.4000 mg | SUBLINGUAL_TABLET | SUBLINGUAL | Status: DC | PRN

## 2019-05-30 MED ORDER — LORATADINE 10 MG PO TABS
10.0000 mg | ORAL_TABLET | Freq: Every day | ORAL | Status: DC | PRN
  Administered 2019-05-30 – 2019-06-01 (×3): 10 mg via ORAL
  Filled 2019-05-30 (×3): qty 1

## 2019-05-30 MED ORDER — DOBUTAMINE IN D5W 4-5 MG/ML-% IV SOLN
2.0000 ug/kg/min | INTRAVENOUS | Status: DC
  Administered 2019-05-30 – 2019-06-02 (×2): 5 ug/kg/min via INTRAVENOUS
  Filled 2019-05-30 (×2): qty 250

## 2019-05-30 MED ORDER — ISOSORBIDE MONONITRATE ER 30 MG PO TB24
30.0000 mg | ORAL_TABLET | Freq: Every day | ORAL | Status: DC
  Administered 2019-05-31 – 2019-06-02 (×3): 30 mg via ORAL
  Filled 2019-05-30 (×3): qty 1

## 2019-05-30 MED ORDER — CITALOPRAM HYDROBROMIDE 20 MG PO TABS
20.0000 mg | ORAL_TABLET | Freq: Every day | ORAL | Status: DC
  Administered 2019-05-30 – 2019-06-01 (×3): 20 mg via ORAL
  Filled 2019-05-30 (×3): qty 1

## 2019-05-30 MED ORDER — MILRINONE LACTATE IN DEXTROSE 20-5 MG/100ML-% IV SOLN
0.2500 ug/kg/min | INTRAVENOUS | Status: DC
  Administered 2019-05-30: 0.25 ug/kg/min via INTRAVENOUS
  Filled 2019-05-30: qty 100

## 2019-05-30 MED ORDER — AZELASTINE HCL 0.1 % NA SOLN
1.0000 | Freq: Every day | NASAL | Status: DC
  Administered 2019-05-31 – 2019-06-02 (×3): 1 via NASAL
  Filled 2019-05-30: qty 30

## 2019-05-30 MED ORDER — ASPIRIN EC 81 MG PO TBEC
81.0000 mg | DELAYED_RELEASE_TABLET | Freq: Every day | ORAL | Status: DC
  Administered 2019-05-31 – 2019-06-02 (×3): 81 mg via ORAL
  Filled 2019-05-30 (×3): qty 1

## 2019-05-30 MED ORDER — MEXILETINE HCL 150 MG PO CAPS
150.0000 mg | ORAL_CAPSULE | Freq: Two times a day (BID) | ORAL | Status: DC
  Administered 2019-05-30 – 2019-06-02 (×6): 150 mg via ORAL
  Filled 2019-05-30 (×7): qty 1

## 2019-05-30 MED ORDER — ACETAMINOPHEN 325 MG PO TABS
650.0000 mg | ORAL_TABLET | Freq: Three times a day (TID) | ORAL | Status: DC | PRN
  Administered 2019-05-31: 650 mg via ORAL
  Filled 2019-05-30: qty 2

## 2019-05-30 MED ORDER — SPIRONOLACTONE 25 MG PO TABS
25.0000 mg | ORAL_TABLET | Freq: Every day | ORAL | Status: DC
  Administered 2019-05-31 – 2019-06-02 (×3): 25 mg via ORAL
  Filled 2019-05-30 (×3): qty 1

## 2019-05-30 MED ORDER — PANTOPRAZOLE SODIUM 40 MG PO TBEC
40.0000 mg | DELAYED_RELEASE_TABLET | Freq: Every day | ORAL | Status: DC
  Administered 2019-05-31 – 2019-06-03 (×4): 40 mg via ORAL
  Filled 2019-05-30 (×4): qty 1

## 2019-05-30 MED ORDER — POLYETHYLENE GLYCOL 3350 17 G PO PACK
17.0000 g | PACK | Freq: Every day | ORAL | Status: DC
  Administered 2019-06-01: 17 g via ORAL
  Filled 2019-05-30 (×3): qty 1

## 2019-05-30 MED ORDER — MILRINONE LACTATE IN DEXTROSE 20-5 MG/100ML-% IV SOLN: 0.2500 ug/kg/min | INTRAVENOUS | Status: DC

## 2019-05-30 MED ORDER — OMEGA-3-ACID ETHYL ESTERS 1 G PO CAPS
2.0000 g | ORAL_CAPSULE | Freq: Every day | ORAL | Status: DC
  Administered 2019-05-31 – 2019-06-01 (×2): 2 g via ORAL
  Filled 2019-05-30 (×3): qty 2

## 2019-05-30 MED ORDER — FUROSEMIDE 10 MG/ML IJ SOLN
80.0000 mg | Freq: Two times a day (BID) | INTRAMUSCULAR | Status: DC
  Administered 2019-05-30 – 2019-06-02 (×6): 80 mg via INTRAVENOUS
  Filled 2019-05-30 (×8): qty 8

## 2019-05-30 MED ORDER — LOSARTAN POTASSIUM 25 MG PO TABS
12.5000 mg | ORAL_TABLET | Freq: Two times a day (BID) | ORAL | Status: DC
  Administered 2019-05-30 – 2019-06-02 (×6): 12.5 mg via ORAL
  Filled 2019-05-30 (×6): qty 1

## 2019-05-30 MED ORDER — DOBUTAMINE IN D5W 4-5 MG/ML-% IV SOLN
INTRAVENOUS | Status: AC
  Administered 2019-05-30: 1000 mg
  Filled 2019-05-30: qty 250

## 2019-05-30 NOTE — H&P (Addendum)
Advanced Heart Failure Team History and Physical Note   PCP:  Stephens Shire, MD  PCP-Cardiology: Jenkins Rouge, MD     Reason for Admission: A/C Systolic Heart Failure   HPI:    Sharlyne E Biggsis a 68 y.o.femalewith a history of CAD, s/p prior Ant MI, s/p prior POBA to LAD, Dx, RCA, CHF due to ICM with high scar burden by MRI (12/09), s/p ICD, HTN, HL, VT, depression,andtobacco abuse.  Over the last year she has struggled with A/C systolic heart failure and has been on milrinone 0.25 mcg at home since January. She was being considered for LVAD and has been seen by Dr Darcey Nora. She was not thought to be a candidate with COPD.   Admitted 5/31 - 6/2 with VT and syncope in setting of hypokalemia  Today she contacted the HF clinic with increased dyspnea and nausea. On Friday she took a  Metolazone plus her routine torsemide 80mg /60 mg and has had not relief. Currently with NYHA IV symptoms with DOE at rest. Unable to do any activity despite milrinone. Weight up about 6-7 pounds  On exam SOB at rest. SBP 80s. JVP to ear. +s3 1-2+ edema. Warm. PICC site ok   Cardiac Studies  Echo2/14/20EF 15-20%, RV normal, mod MR - CPX 9/19 with severe functional limitation due predominantly to HF. - RHC 1/20 with low output. CI 1.7. Started on home milrinone at 0.25  Review of Systems: [y] = yes, [ ]  = no   General: Weight gain [Y ]; Weight loss [ ] ; Anorexia [ ] ; Fatigue [Y ]; Fever [ ] ; Chills [ ] ; Weakness [ ]   Cardiac: Chest pain/pressure [ ] ; Resting SOB [ Y]; Exertional SOB [ Y]; Orthopnea [ Y]; Pedal Edema [Y]; Palpitations [ ] ; Syncope [ ] ; Presyncope [ ] ; Paroxysmal nocturnal dyspnea[ ]   Pulmonary: Cough [ ] ; Wheezing[ ] ; Hemoptysis[ ] ; Sputum [ ] ; Snoring [ ]   GI: Vomiting[ ] ; Dysphagia[ ] ; Melena[ ] ; Hematochezia [ ] ; Heartburn[ ] ; Abdominal pain [ ] ; Constipation [ ] ; Diarrhea [ ] ; BRBPR [ ]   GU: Hematuria[ ] ; Dysuria [ ] ; Nocturia[ ]   Vascular: Pain in legs with walking [ ] ;  Pain in feet with lying flat [ ] ; Non-healing sores [ ] ; Stroke [ ] ; TIA [ ] ; Slurred speech [ ] ;  Neuro: Headaches[ ] ; Vertigo[ ] ; Seizures[ ] ; Paresthesias[ ] ;Blurred vision [ ] ; Diplopia [ ] ; Vision changes [ ]   Ortho/Skin: Arthritis [ Y]; Joint pain [Y ]; Muscle pain [ ] ; Joint swelling [ ] ; Back Pain [ ] ; Rash [ ]   Psych: Depression[ Y]; Anxiety[Y ]  Heme: Bleeding problems [ ] ; Clotting disorders [ ] ; Anemia [ ]   Endocrine: Diabetes [ ] ; Thyroid dysfunction[ ]    Home Medications Prior to Admission medications   Medication Sig Start Date End Date Taking? Authorizing Provider  acetaminophen (TYLENOL ARTHRITIS PAIN) 650 MG CR tablet Take 650 mg by mouth every 8 (eight) hours as needed for pain.     [provider]  albuterol (PROAIR HFA) 108 (90 Base) MCG/ACT inhaler Inhale 2 puffs into the lungs every 6 (six) hours as needed for wheezing.  01/12/18   [provider]  aspirin EC 81 MG tablet Take 81 mg by mouth daily.    [provider]  azelastine (ASTELIN) 0.1 % nasal spray Place 1 spray into both nostrils daily.     [provider]  budesonide-formoterol (SYMBICORT) 160-4.5 MCG/ACT inhaler Inhale 2 puffs into the lungs daily.  10/14/17   [provider]  citalopram (CELEXA) 20 MG tablet Take 20 mg by mouth at bedtime.  12/16/17   [provider]  clonazePAM (KLONOPIN) 0.5 MG tablet Take 0.5 mg by mouth 2 (two) times daily as needed for anxiety.  11/07/18   [provider]  ezetimibe (ZETIA) 10 MG tablet TAKE 1/2 TABLET TWICE A WEEK Patient taking differently: Take 5 mg by mouth 2 (two) times a week. Saturday and Sunday 11/07/18   Bensimhon, Shaune Pascal, MD  Famotidine (PEPCID PO) Take 1 tablet by mouth 3 (three) times daily as needed (itching).    [provider]  fish oil-omega-3 fatty acids 1000 MG capsule Take 2 g by mouth at bedtime.     [provider]  hydrocortisone cream 1 % Apply 1 application topically 4  (four) times daily as needed for itching.    [provider]  isosorbide mononitrate (IMDUR) 30 MG 24 hr tablet TAKE 1 TABLET BY MOUTH EVERY DAY 08/02/18   Clegg, Amy D, NP  loratadine (CLARITIN) 10 MG tablet Take 10 mg by mouth daily as needed (seasonal allergies).     [provider]  losartan (COZAAR) 25 MG tablet TAKE 0.5 TABLETS (12.5 MG TOTAL) BY MOUTH 2 (TWO) TIMES DAILY. 07/27/18   Bensimhon, Shaune Pascal, MD  metolazone (ZAROXOLYN) 2.5 MG tablet Take 1 tablet (2.5 mg total) by mouth as needed for up to 10 days. Patient taking differently: Take 2.5 mg by mouth every Friday.  01/20/19 06/06/19  Bensimhon, Shaune Pascal, MD  mexiletine (MEXITIL) 150 MG capsule Take 1 capsule (150 mg total) by mouth 2 (two) times daily. 07/06/18   Clegg, Amy D, NP  milrinone (PRIMACOR) 20 MG/100 ML SOLN infusion Inject 0.013 mg/min into the vein continuous. 12/23/18   Georgiana Shore, NP  nitroGLYCERIN (NITROSTAT) 0.4 MG SL tablet Place 1 tablet (0.4 mg total) under the tongue every 5 (five) minutes as needed for chest pain. 10/28/16   Josue Hector, MD  omeprazole (PRILOSEC) 40 MG capsule Take 40 mg by mouth daily.     [provider]  polyethylene glycol (MIRALAX / GLYCOLAX) packet Take 17 g by mouth at bedtime.    [provider]  potassium chloride SA (K-DUR) 20 MEQ tablet Take 2 tablets (40 mEq total) by mouth daily. 05/16/19   Clegg, Amy D, NP  rosuvastatin (CRESTOR) 5 MG tablet TAKE 1 TABLET BY MOUTH ON MON/WED/FRI 05/13/2019   Larey Dresser, MD  spironolactone (ALDACTONE) 25 MG tablet TAKE 1 TABLET BY MOUTH EVERY DAY 05/29/19   Bensimhon, Shaune Pascal, MD  torsemide (DEMADEX) 20 MG tablet Taking 80 mg in am/60 mg in pm 05/09/19   Darrick Grinder D, NP    Past Medical History: Past Medical History:  Diagnosis Date  . Arthritis    hands  . Asthma   . CAD (coronary artery disease)    a. s/p prior Ant MI, b. s/p prior POBA to LAD, Dx, RCA,;  c.  LHC (10/12):  dLAD 40, pCFX 30, OM1 50, pRCA  30;  d.  [facility] Myoview (11/14):  High risk study; inferior, anterior, apical defects; minimal reversibility toward the apex; consistent with prior infarct and very mild peri-infarct ischemia, EF 24%, inferior/apical/anterior akinesis  . CHF (congestive heart failure) ([place])   . Chronic systolic heart failure ([place])   . CKD (chronic kidney disease), stage III ([place])   . Depression   . H/O hiatal hernia   . HLD (hyperlipidemia)   .  HTN (hypertension)   . Ischemic cardiomyopathy    MRI (12/09):  High scar burden, Inf and Apical AK, EF 24%.;  Echo (10/14): EF 15%, Gr 2 DD, mild to mod MR, mod LAE, RVSF mildly reduced, mod RAE, severe TR, PASP 49-53 (mod pulmonary HTN)  . MI (myocardial infarction) ([place])   . Tobacco abuse     Past Surgical History: Past Surgical History:  Procedure Laterality Date  . ABDOMINAL AORTOGRAM W/LOWER EXTREMITY N/A 03/02/2018   Procedure: ABDOMINAL AORTOGRAM W/LOWER EXTREMITY;  Surgeon: Wellington Hampshire, MD;  Location: [facility];  Service: Cardiovascular;  Laterality: N/A;  . CARDIAC DEFIBRILLATOR PLACEMENT  01/18/2009   MDT single chamber ICD implanted by Dr Rayann Heman   . cardiac stents    . COLONOSCOPY  03/18/2012   Procedure: COLONOSCOPY;  Surgeon: Beryle Beams, MD;  Location: [facility] ENDOS[facility];  Service: Endoscopy;  Laterality: N/A;  . CORONARY ANGIOPLASTY    . ESOPHAGOGASTRODUODENOSCOPY N/A 03/10/2013   Procedure: ESOPHAGOGASTRODUODENOSCOPY (EGD);  Surgeon: Beryle Beams, MD;  Location: [facility];  Service: Endoscopy;  Laterality: N/A;  . IR FLUORO GUIDE CV LINE RIGHT  12/23/2018  . IR US GUIDE VASC ACCESS RIGHT  12/23/2018  . LEFT HEART CATHETERIZATION WITH CORONARY ANGIOGRAM N/A 07/26/2014   Procedure: LEFT HEART CATHETERIZATION WITH CORONARY ANGIOGRAM;  Surgeon: Jolaine Artist, MD;  Location: [facility] CATH LAB;  Service: Cardiovascular;  Laterality: N/A;  . RIGHT HEART CATH N/A 12/22/2018   Procedure: RIGHT HEART CATH;  Surgeon: Jolaine Artist, MD;   Location: [facility] CV LAB;  Service: Cardiovascular;  Laterality: N/A;  . RIGHT/LEFT HEART CATH AND CORONARY ANGIOGRAPHY N/A 06/22/2018   Procedure: RIGHT/LEFT HEART CATH AND CORONARY ANGIOGRAPHY;  Surgeon: Larey Dresser, MD;  Location: [facility] CV LAB;  Service: Cardiovascular;  Laterality: N/A;    Family History:  Family History  Problem Relation Age of Onset  . Stroke Mother   . Diabetes Mellitus II Sister   . Breast cancer Sister     Social History: Social History   Socioeconomic History  . Marital status: Divorced    Spouse name: Not on file  . Number of children: 2  . Years of education: Not on file  . Highest education level: Not on file  Occupational History  . Occupation: Works for [employer]: [place]  . Financial resource strain: Not on file  . Food insecurity    Worry: Not on file    Inability: Not on file  . Transportation needs    Medical: Not on file    Non-medical: Not on file  Tobacco Use  . Smoking status: Former Smoker    Years: 40.00    Types: Cigarettes  . Smokeless tobacco: Never Used  Substance and Sexual Activity  . Alcohol use: No  . Drug use: No  . Sexual activity: Not Currently  Lifestyle  . Physical activity    Days per week: Not on file    Minutes per session: Not on file  . Stress: Not on file  Relationships  . Social Herbalist on phone: Not on file    Gets together: Not on file    Attends religious service: Not on file    Active member of club or organization: Not on file    Attends meetings of clubs or organizations: Not on file    Relationship status: Not on file  Other Topics Concern  . Not on file  Social History Narrative   Lives in [place]    Allergies:  Allergies  Allergen Reactions  . Prednisone Shortness Of Breath, Swelling, Rash and Other (See Comments)    Sore throat, also  . Zebeta [Bisoprolol Fumarate] Other (See Comments)    Caused confusion  . Eggs Or  Egg-Derived Products Other (See Comments)    Was told she is allergic to EGG WHITES  . Penicillins Itching and Rash    Did it involve swelling of the face/tongue/throat, SOB, or low BP? No Did it involve sudden or severe rash/hives, skin peeling, or any reaction on the inside of your mouth or nose? No Did you need to seek medical attention at a hospital or doctor's office? No When did it last happen?68 years old If all above answers are "NO", may proceed with cephalosporin use.  . Statins Other (See Comments)    Gradually tired with daily Lipitor, made pt feel badly (Pt can take Crestor 5 mg 4 times per week and Zetia 5 mg two times a week)    Objective:    Vital Signs:       There were no vitals filed for this visit.   Physical Exam     General:  Weak appearing SOB at rest  HEENT: Normal Neck: Supple. JVP to ear  Carotids 2+ bilat; no bruits. No lymphadenopathy or thyromegaly appreciated. RIJ PICC ok Cor: PMI nondisplaced. Regular rate & rhythm. +s3 Lungs: Clear Abdomen: Soft, nontender, nondistended. No hepatosplenomegaly. No bruits or masses. Good bowel sounds. Extremities: No cyanosis, clubbing, rash, 2+ edema Neuro: Alert & oriented x 3, cranial nerves grossly intact. moves all 4 extremities w/o difficulty. Affect pleasant.   Telemetry   Sinus 80-90s Personally reviewed  EKG   pending  Labs    pending  Basic Metabolic Panel: No results for input(s): NA, K, CL, CO2, GLUCOSE, BUN, CREATININE, CALCIUM, MG, PHOS in the last 168 hours.  Liver Function Tests: No results for input(s): AST, ALT, ALKPHOS, BILITOT, PROT, ALBUMIN in the last 168 hours. No results for input(s): LIPASE, AMYLASE in the last 168 hours. No results for input(s): AMMONIA in the last 168 hours.  CBC: No results for input(s): WBC, NEUTROABS, HGB, HCT, MCV, PLT in the last 168 hours.  Cardiac Enzymes: No results for input(s): CKTOTAL, CKMB, CKMBINDEX, TROPONINI in the last 168 hours.   BNP: BNP (last 3 results) Recent Labs    09/14/18 1143 05/08/19 0634 05/17/19 1040  BNP 1,056.5* 1,026.9* 655.9*    ProBNP (last 3 results) No results for input(s): PROBNP in the last 8760 hours.   CBG: No results for input(s): GLUCAP in the last 168 hours.  Coagulation Studies: No results for input(s): LABPROT, INR in the last 72 hours.  Imaging:  No results found.     Assessment/Plan   1. A/C Systolic Heart Failure, ICM on milrinone 0.25 mcg.   Most recent ECHO 01/2019 EF 15-20%.  -Chec CVP and CO-OX .  -Volume overloaded today. Start lasix 80 mg twice a day.  -No bb with low output.  -Off dig with recent elevated level.  - Continue spiro and losartan.  -Check BMET  2. CKD Stage IV  On 6/12 creatinine was 2.0  Check BMET   3. COPD Quit smoking 12/2018  4. H/O VT with ICD firing in setting of hypokalemia on 05/06/19 -  No driving for 6 months - Check BMEt and Mag level  5. CAD s/p previous anterior MI: Cath 8/15 nonobstructive CAD.  -  Cath 7/19 non-obstructive CAD.  - Continue ASA and statin. Continue imdur.  - No s/s ischemia.  6. PAD with Intermittent claudication: Medical therapy.  - Moderate bilateral calf claudication worse on the left side due to known SFA disease. This has progressed over past few months  - Had peripheral vascular catheterization 03/02/18 with no significant aortoiliac disease. Diffuse 60-70% disease affecting the left proximal and mid SFA with short occlusion distally with reconstitution via collaterals from the profunda. Three-vessel runoff below the knee.  - Continue Risk factor management and smoking cessation.No change.   7. Bilateral carotid stenosis - Followed by Dr. Johnsie Cancel.No change.   8. Hyperlipidemia with intolerance of multiple cholesterol meds: - Tolerating Crestor 5 mg three times/week and Zetia 5mg  two times weekly.No change.   Admit to progressive care.   Darrick Grinder, NP 05/29/2019, 3:48 PM  Advanced  Heart Failure Team Pager [phone] (M-F; 7a - 4p)  Please contact [facility] for night-coverage after hours (4p -7a ) and weekends on [email or web address]  Patient seen and examined with the above-signed Advanced Practice Provider and/or Housestaff. I personally reviewed laboratory data, imaging studies and relevant notes. I independently examined the patient and formulated the important aspects of the plan. I have edited the note to reflect any of my changes or salient points. I have personally discussed the plan with the patient and/or family.   68 y/o with COPD, CKD 3-4, end-stage systolic HF EF 46% on home milrinone.   Now admitted with NYHA IV sx and volume overload despite milrinone support. Not responding to IV diuretics. SBP in 80s CVP 13  Admit to SDU. Start IV lasix. Check co-ox. May need to consider switch to dobutamine.   Discussed with VAD team recently and not felt to be VAD candidate due to comorbidities. May be nearing time for Hospice eval.   Glori Bickers, MD  8:45 PM   Addendum:  Co-ox 40% SBP 80.    Stop milrinone. Start dobutamine 5.   Glori Bickers, MD  9:19 PM

## 2019-05-30 NOTE — Progress Notes (Signed)
Dr. Haroldine Laws call service paged for consult for direct admit pt.

## 2019-05-30 NOTE — Plan of Care (Signed)
  Problem: Education: Goal: Knowledge of General Education information will improve Description: Including pain rating scale, medication(s)/side effects and non-pharmacologic comfort measures Outcome: Progressing   Problem: Clinical Measurements: Goal: Diagnostic test results will improve Outcome: Progressing Goal: Respiratory complications will improve Outcome: Progressing Goal: Cardiovascular complication will be avoided Outcome: Progressing   Problem: Activity: Goal: Risk for activity intolerance will decrease Outcome: Progressing   

## 2019-05-30 NOTE — Telephone Encounter (Signed)
Pt called stating she is full of fluid and more SOB.  Discussed w/Dr Bensimhon, he advises if Metolazone has not helped she may just need to be direct admit to cardiac stepdown.  Called and discussed further, she states her wt is up she took Metolazone on Fri and wt maybe came down a lb or 2 but then went right back up, she is SOB w/any activity and even had to stop while bathing to catch her breathe, she states her abd is pretty distended and she is having some nausea, she states her appetite is off/on, she is agreeable to admission   Called Pt placement and they are aware of need and state they are currently holding so may be a while.  Notified pt of this and she is agreeable.

## 2019-05-31 ENCOUNTER — Other Ambulatory Visit: Payer: Self-pay

## 2019-05-31 ENCOUNTER — Encounter (HOSPITAL_COMMUNITY): Payer: Self-pay | Admitting: *Deleted

## 2019-05-31 LAB — BASIC METABOLIC PANEL
Anion gap: 12 (ref 5–15)
BUN: 50 mg/dL — ABNORMAL HIGH (ref 8–23)
CO2: 25 mmol/L (ref 22–32)
Calcium: 9.2 mg/dL (ref 8.9–10.3)
Chloride: 98 mmol/L (ref 98–111)
Creatinine, Ser: 2.21 mg/dL — ABNORMAL HIGH (ref 0.44–1.00)
GFR calc Af Amer: 25 mL/min — ABNORMAL LOW (ref >60)
GFR calc non Af Amer: 22 mL/min — ABNORMAL LOW (ref >60)
Glucose, Bld: 91 mg/dL (ref 70–99)
Potassium: 3.9 mmol/L (ref 3.5–5.1)
Sodium: 135 mmol/L (ref 135–145)

## 2019-05-31 LAB — COOXEMETRY PANEL
Carboxyhemoglobin: 1.4 % (ref 0.5–1.5)
Methemoglobin: 0.7 % (ref 0.0–1.5)
O2 Saturation: 60.2 %
Total hemoglobin: 10.2 g/dL — ABNORMAL LOW (ref 12.0–16.0)

## 2019-05-31 MED ORDER — ENOXAPARIN SODIUM 30 MG/0.3ML ~~LOC~~ SOLN
30.0000 mg | Freq: Every day | SUBCUTANEOUS | Status: DC
  Administered 2019-05-31 – 2019-06-01 (×2): 30 mg via SUBCUTANEOUS
  Filled 2019-05-31 (×2): qty 0.3

## 2019-05-31 NOTE — Plan of Care (Signed)
  Problem: Education: Goal: Ability to demonstrate management of disease process will improve Outcome: Progressing Goal: Ability to verbalize understanding of medication therapies will improve Outcome: Progressing   Problem: Activity: Goal: Capacity to carry out activities will improve Outcome: Progressing   Problem: Education: Goal: Knowledge of General Education information will improve Description: Including pain rating scale, medication(s)/side effects and non-pharmacologic comfort measures Outcome: Progressing   Problem: Clinical Measurements: Goal: Ability to maintain clinical measurements within normal limits will improve Outcome: Progressing Goal: Will remain free from infection Outcome: Progressing Goal: Diagnostic test results will improve Outcome: Progressing Goal: Respiratory complications will improve Outcome: Progressing Goal: Cardiovascular complication will be avoided Outcome: Progressing   Problem: Nutrition: Goal: Adequate nutrition will be maintained Outcome: Progressing

## 2019-05-31 NOTE — Progress Notes (Addendum)
Advanced Heart Failure Rounding Note  PCP-Cardiologist: Jenkins Rouge, MD   Subjective:     Yesterday milrinone was stopped with low co-ox (40%) and dobutamine 5 mcg started. CO-OX improved from 40%>60%. Diuresing with 80 mg IV lasix. Weight down 2 pounds.   Feeling better today. Denies SOB. Still weak. No orthopnea or PND   Objective:   Weight Range: 52.8 kg Body mass index is 20.62 kg/m.   Vital Signs:   Temp:  [97.9 F (36.6 C)-98.1 F (36.7 C)] 98 F (36.7 C) (06/24 0732) Pulse Rate:  [92-97] 95 (06/24 0757) Resp:  [16-22] 16 (06/24 0757) BP: (80-92)/(48-58) 88/53 (06/24 0732) SpO2:  [96 %-100 %] 100 % (06/24 0757) Weight:  [52.8 kg-53.9 kg] 52.8 kg (06/24 0329) Last BM Date: 05/17/2019  Weight change: Filed Weights   05/23/2019 1858 05/31/19 0329  Weight: 53.9 kg 52.8 kg    Intake/Output:   Intake/Output Summary (Last 24 hours) at 05/31/2019 0851 Last data filed at 05/31/2019 0750 Gross per 24 hour  Intake 1.7 ml  Output 1350 ml  Net -1348.3 ml      Physical Exam   CVP 10 General:   No resp difficulty HEENT: Normal Neck: Supple. JVP ~10. Carotids 2+ bilat; no bruits. No lymphadenopathy or thyromegaly appreciated. Cor: PMI nondisplaced. Regular rate & rhythm. No rubs, or murmurs. + S3  R upper chest tunneled catheter.  Lungs: BS decreased . LLL crackles.  Abdomen: Soft, nontender, nondistended. No hepatosplenomegaly. No bruits or masses. Good bowel sounds. Extremities: warm, no cyanosis, clubbing, rash, edema Neuro: Alert & orientedx3, cranial nerves grossly intact. moves all 4 extremities w/o difficulty. Affect pleasant   Telemetry  Sinus Tach 100s personally reviewed.    EKG    N/A   Labs    CBC Recent Labs    05/10/2019 2053  WBC 6.9  HGB 11.0*  HCT 33.6*  MCV 96.6  PLT 277   Basic Metabolic Panel Recent Labs    05/27/2019 2053 05/31/19 0518  NA 134* 135  K 4.9 3.9  CL 99 98  CO2 25 25  GLUCOSE 128* 91  BUN 54* 50*  CREATININE  2.87* 2.21*  CALCIUM 9.3 9.2   Liver Function Tests No results for input(s): AST, ALT, ALKPHOS, BILITOT, PROT, ALBUMIN in the last 72 hours. No results for input(s): LIPASE, AMYLASE in the last 72 hours. Cardiac Enzymes No results for input(s): CKTOTAL, CKMB, CKMBINDEX, TROPONINI in the last 72 hours.  BNP: BNP (last 3 results) Recent Labs    05/08/19 0634 05/17/19 1040 05/14/2019 2053  BNP 1,026.9* 655.9* 1,314.5*    ProBNP (last 3 results) No results for input(s): PROBNP in the last 8760 hours.   D-Dimer No results for input(s): DDIMER in the last 72 hours. Hemoglobin A1C No results for input(s): HGBA1C in the last 72 hours. Fasting Lipid Panel No results for input(s): CHOL, HDL, LDLCALC, TRIG, CHOLHDL, LDLDIRECT in the last 72 hours. Thyroid Function Tests No results for input(s): TSH, T4TOTAL, T3FREE, THYROIDAB in the last 72 hours.  Invalid input(s): FREET3  Other results:   Imaging     No results found.   Medications:     Scheduled Medications: . aspirin EC  81 mg Oral Daily  . azelastine  1 spray Each Nare Daily  . citalopram  20 mg Oral QHS  . [START ON 07/03/19] ezetimibe  5 mg Oral Once per day on Sun Sat  . furosemide  80 mg Intravenous BID  . isosorbide mononitrate  30 mg Oral Daily  . losartan  12.5 mg Oral BID  . mexiletine  150 mg Oral BID  . mometasone-formoterol  2 puff Inhalation BID  . omega-3 acid ethyl esters  2 g Oral QHS  . pantoprazole  40 mg Oral Daily  . polyethylene glycol  17 g Oral QHS  . potassium chloride SA  40 mEq Oral Daily  . spironolactone  25 mg Oral Daily     Infusions: . DOBUTamine 5 mcg/kg/min (05/12/2019 2130)     PRN Medications:  acetaminophen, albuterol, loratadine, nitroGLYCERIN    Patient Profile   Terri E Biggsis a 68 y.o.femalewith a history of CAD, s/p prior Ant MI, s/p prior POBA to LAD, Dx, RCA, CHF due to ICM with high scar burden by MRI (12/09), s/p ICD, HTN, HL, VT,  depression,andtobacco abuse.  Admitted on home milrione 0.25 mcg with marked volume overload.   Assessment/Plan   1. A/C Systolic Heart Failure, ICM on milrinone 0.25 mcg.   Most recent ECHO 01/2019 EF 15-20%.  Initial CO-OX 40%. Milrinone was stopped. Switched to dobutamine 5 mcg.  Todays CO-OX is 60%. CVP trending down.  Weight coming down to 116 pounds. Baseline weight 112-113 pounds.  - Continue IV lasix 80 mg twice a day.  -No bb with low output.  -Off dig with recent elevated level.  - Continue spiro and losartan.  -Renal function improving.  - Not a candidate for LVAD due to multiple co-morbidities.   2. Acute on Chronic CKDStage IV  Creatinine on admit 2.87. Trending down today, 2.21.   BMEt in am.   3. COPD Quit smoking 12/2018  4. H/O VT with ICD firing in setting of hypokalemia on 05/06/19 -No driving for 6 months - No Vt   5. CAD s/p previous anterior MI: Cath 8/15 nonobstructive CAD.  - Cath 7/19 non-obstructive CAD.  - Continue ASA and statin. Continue imdur.  - No s/s ischemia.  6. PAD with Intermittent claudication: Medical therapy.  - Moderate bilateral calf claudication worse on the left side due to known SFA disease. This has progressed over past few months  - Had peripheral vascular catheterization 03/02/18 with no significant aortoiliac disease. Diffuse 60-70% disease affecting the left proximal and mid SFA with short occlusion distally with reconstitution via collaterals from the profunda. Three-vessel runoff below the knee.  - Continue Risk factor management and smoking cessation.No change.  7. Bilateral carotid stenosis - Followed by Dr. Johnsie Cancel.No change.  8. Hyperlipidemia with intolerance of multiple cholesterol meds: - Tolerating Crestor 5 mg three times/week and Zetia 5mg  two times weekly.No change.  Will need [facility] conversation.   Length of Stay: 1  Amy Clegg, NP  05/31/2019, 8:51 AM  Advanced Heart Failure Team Pager  [phone] (M-F; 7a - 4p)  Please contact [facility] for night-coverage after hours (4p -7a ) and weekends on [email or web address]  Patient seen and examined with the above-signed Advanced Practice Provider and/or Housestaff. I personally reviewed laboratory data, imaging studies and relevant notes. I independently examined the patient and formulated the important aspects of the plan. I have edited the note to reflect any of my changes or salient points. I have personally discussed the plan with the patient and/or family.  She remains very tenuous. Co-ox on an admit 40% on milrinone. Now improved on dobutamine but I told her that this would likely not last long. She has been seen by VAD team recently and felt not to be vAD candidate due to AKI and  COPD. Will continue dobutamine and see how much better creatinine will get but I doubt this will change her status. We discussed need to get Hospice involved. I will also reach out to [facility] to discuss.   Glori Bickers, MD  3:34 PM

## 2019-06-01 DIAGNOSIS — Z515 Encounter for palliative care: Secondary | ICD-10-CM

## 2019-06-01 DIAGNOSIS — Z95810 Presence of automatic (implantable) cardiac defibrillator: Secondary | ICD-10-CM

## 2019-06-01 DIAGNOSIS — Z7189 Other specified counseling: Secondary | ICD-10-CM

## 2019-06-01 DIAGNOSIS — I255 Ischemic cardiomyopathy: Secondary | ICD-10-CM

## 2019-06-01 LAB — BASIC METABOLIC PANEL
Anion gap: 14 (ref 5–15)
BUN: 54 mg/dL — ABNORMAL HIGH (ref 8–23)
CO2: 22 mmol/L (ref 22–32)
Calcium: 9.5 mg/dL (ref 8.9–10.3)
Chloride: 97 mmol/L — ABNORMAL LOW (ref 98–111)
Creatinine, Ser: 2.69 mg/dL — ABNORMAL HIGH (ref 0.44–1.00)
GFR calc Af Amer: 20 mL/min — ABNORMAL LOW (ref >60)
GFR calc non Af Amer: 17 mL/min — ABNORMAL LOW (ref >60)
Glucose, Bld: 122 mg/dL — ABNORMAL HIGH (ref 70–99)
Potassium: 4.5 mmol/L (ref 3.5–5.1)
Sodium: 133 mmol/L — ABNORMAL LOW (ref 135–145)

## 2019-06-01 LAB — COOXEMETRY PANEL
Carboxyhemoglobin: 1.5 % (ref 0.5–1.5)
Methemoglobin: 0.6 % (ref 0.0–1.5)
O2 Saturation: 37.9 %
Total hemoglobin: 10.9 g/dL — ABNORMAL LOW (ref 12.0–16.0)

## 2019-06-01 MED ORDER — CLONAZEPAM 0.5 MG PO TABS
0.2500 mg | ORAL_TABLET | Freq: Two times a day (BID) | ORAL | Status: DC | PRN
  Administered 2019-06-01 – 2019-06-02 (×3): 0.25 mg via ORAL
  Filled 2019-06-01 (×6): qty 1

## 2019-06-01 MED ORDER — ALTEPLASE 2 MG IJ SOLR
2.0000 mg | Freq: Once | INTRAMUSCULAR | Status: AC
  Administered 2019-06-01: 2 mg

## 2019-06-01 MED ORDER — CLONAZEPAM 0.5 MG PO TABS: 0.2500 mg | ORAL_TABLET | Freq: Two times a day (BID) | ORAL | Status: DC | PRN

## 2019-06-01 MED ORDER — ONDANSETRON 4 MG PO TBDP
4.0000 mg | ORAL_TABLET | Freq: Three times a day (TID) | ORAL | Status: DC | PRN
  Administered 2019-06-01 – 2019-06-02 (×2): 4 mg via ORAL
  Filled 2019-06-01 (×2): qty 1

## 2019-06-01 NOTE — Consult Note (Signed)
Consultation Note Date: 06/01/2019   Patient Name: [name]  DOB: 07/16/1951  MRN: [id number]  Age / Sex: 68 y.o., female  PCP: Stephens Shire, MD Referring Physician: Jolaine Artist, MD  Reason for Consultation: Establishing goals of care  HPI/Patient Profile: 68 y.o. female  with past medical history of ICM EF 15-20%, home milrinone, systolic CHF, MI, HTN, HLD, CKD, COPD, past smoker, depression, PAD admitted on 05/24/2019 with shortness of breath and fluid overload. Patient is not a candidate for LVAD due to co-morbidities. Dr. Haroldine Laws also discussed LVAD with [facility], who did not feel she is a candidate with underlying co-morbidities. Milrinone stopped and switched to dobutamine infusion inpatient. Per attending, can consider trial of home dobutamine versus hospice services. Palliative medicine consultation for goals of care.   Clinical Assessment and Goals of Care:  I have reviewed medical records, discussed with care team, and met with patient at bedside this AM to discuss diagnosis, prognosis, GOC, EOL wishes, disposition and options.  Introduced Palliative Medicine as specialized medical care for people living with serious illness. It focuses on providing relief from the symptoms and stress of a serious illness. The goal is to improve quality of life for both the patient and the family.  We discussed a brief life review of the patient. Prior to admission, living home alone and fairly independent. She describes herself as "active" but this has been challenging lately with worsening heart failure. She was started on Milrinone home infusion in January. She previously loved gardening. One daughter and two grandchildren. Supportive daughter who lives close to her and readily available to assist in her care.   Discussed events leading up to admission and course of hospitalization including diagnoses,  interventions, and the unfortunate end-stage of her heart disease indicating poor prognosis. Patient understands she is not a candidate for LVAD placement through [facility] or [facility].   The natural disease trajectory and expectations at EOL were discussed. Discussed Dr. Letha Cape recommendation for home dobutamine infusion and/or hospice initiation (eligibility meaning 6 months if not significantly less time). Introduced hospice options and philosophy, with focus on comfort, symptom management, and preventing recurrent hospitalization.   I attempted to elicit values and goals of care important to the patient knowing time is short. [name] shares that being home and spending time with her daughter and grandchildren are most important. She speaks of her strong [place] faith and belief that she is at peace and this is in the Lord's hands.   Advanced directives, concepts specific to code status, artifical feeding and hydration, and rehospitalization were considered and discussed. Introduced MOST form. Patient tells me she has a documented living will and has spoken to her daughter, [name], regarding wishes against heroic interventions including resuscitation, life support, or feeding tube.   Patient gives me permission to call her daughter after 2pm to discuss goals of care, MOST form, and hospice options.   Patient c/o of anxiety and nausea. Medications added to [facility] and notified RN.     **This afternoon,  spoke with patient's daughter [name] via telephone.   Introduced Palliative Medicine as specialized medical care for people living with serious illness. It focuses on providing relief from the symptoms and stress of a serious illness. The goal is to improve quality of life for both the patient and the family.  Discussed course of hospitalization including diagnoses, interventions, poor prognosis, and recommendations from heart failure team. [name] understands her mother's poor prognosis and shares her  belief that Santana has "come to peace" with this knowing what was to come in the future with progressive heart failure. [name] understands she is not a LVAD candidate with underlying comorbidities.   I attempted to elicit values and goals of care important to the daughter. [name] wishes to "keep her comfortable" and "follow her wishes." I updated [name] on my conversation regarding MOST form and heroic interventions. [name] confirms her mother's decision for DNR/DNI and no heroic measures at EOL. [name] requests I complete MOST form with her mother.   Discussed hospice philosophy and options. [name] wishes to bring her mother home and is interested in support from hospice. Explained that we could reach out to hospice agencies to see if they will accept her mother with dobutamine infusion, understanding poor prognosis even with dobutamine infusion. [name] agreeable with this plan.   Answered questions and concerns. [name] understands PMT f/u tomorrow regarding plan of care.   After speaking with daughter, f/u with patient at bedside. Completed MOST form. Patient's wishes include DNR/DNI, focus on comfort measures, ABX if indicated, IVF for time trial if indicated, and NO Feeding tube. Copies made for chart and patient. Hard Choices booklet left for review.   Updated patient that I have spoken with her daughter and plan to reach out to hospice agencies to see if they will accept her with home dobutamine infusion.     SUMMARY OF RECOMMENDATIONS    GOC discussion with patient and daughter.   MOST form completed. Patient's wishes include: DNR/DNI, comfort focused care, ABX/IVF for time trial if indicated, and NO feeding tube.   Patient/daughter interested in home hospice services and request [facility] RN/SW reach out to hospice agencies to inquire whether they will accept with home dobutamine infusion on discharge.   PMT NP will f/u tomorrow. If we cannot find a hospice agency that will accept home dobutamine  infusion, will need to discuss plan for home with home health/dobutamine OR home with hospice services.   Code Status/Advance Care Planning:  DNR  Symptom Management:   Klonopin 0.'25mg'$  PO BID prn (home dose is 0.'5mg'$  PO BID prn)  Zofran '4mg'$  ODT q8h prn nausea  Palliative Prophylaxis:   Aspiration, Delirium Protocol, Frequent Pain Assessment, Oral Care and Turn Reposition  Psycho-social/Spiritual:   Desire for further Chaplaincy support:yes  Additional Recommendations: Caregiving  Support/Resources, Compassionate Wean Education and Education on Hospice  Prognosis:   Months if not weeks prognosis with end-stage heart failure/ICM EF 15-20%. Not an LVAD candidate and requiring continuous dobutamine infusion.   Discharge Planning: Pending     Primary Diagnoses: Present on Admission: . Acute on chronic systolic CHF (congestive heart failure) ([place]) . CAD (coronary artery disease) . Chronic obstructive pulmonary disease ([place]) . Implantable cardioverter-defibrillator (ICD) in situ . Hyperlipidemia . Bilateral carotid artery stenosis . Acute on chronic systolic (congestive) heart failure ([place])   I have reviewed the medical record, interviewed the patient and family, and examined the patient. The following aspects are pertinent.  Past Medical History:  Diagnosis Date  . Arthritis  hands  . Asthma   . CAD (coronary artery disease)    a. s/p prior Ant MI, b. s/p prior POBA to LAD, Dx, RCA,;  c.  LHC (10/12):  dLAD 40, pCFX 30, OM1 50, pRCA 30;  d.  [facility] Myoview (11/14):  High risk study; inferior, anterior, apical defects; minimal reversibility toward the apex; consistent with prior infarct and very mild peri-infarct ischemia, EF 24%, inferior/apical/anterior akinesis  . CHF (congestive heart failure) ([place])   . Chronic systolic heart failure ([place])   . CKD (chronic kidney disease), stage III ([place])   . Depression   . H/O hiatal hernia   . HLD (hyperlipidemia)   . HTN  (hypertension)   . Ischemic cardiomyopathy    MRI (12/09):  High scar burden, Inf and Apical AK, EF 24%.;  Echo (10/14): EF 15%, Gr 2 DD, mild to mod MR, mod LAE, RVSF mildly reduced, mod RAE, severe TR, PASP 49-53 (mod pulmonary HTN)  . MI (myocardial infarction) ([place])   . Tobacco abuse    Social History   Socioeconomic History  . Marital status: Divorced    Spouse name: Not on file  . Number of children: 2  . Years of education: Not on file  . Highest education level: Not on file  Occupational History  . Occupation: Works for [employer]: [place]  . Financial resource strain: Not on file  . Food insecurity    Worry: Not on file    Inability: Not on file  . Transportation needs    Medical: Not on file    Non-medical: Not on file  Tobacco Use  . Smoking status: Former Smoker    Years: 40.00    Types: Cigarettes  . Smokeless tobacco: Never Used  Substance and Sexual Activity  . Alcohol use: No  . Drug use: No  . Sexual activity: Not Currently  Lifestyle  . Physical activity    Days per week: Not on file    Minutes per session: Not on file  . Stress: Not on file  Relationships  . Social Herbalist on phone: Not on file    Gets together: Not on file    Attends religious service: Not on file    Active member of club or organization: Not on file    Attends meetings of clubs or organizations: Not on file    Relationship status: Not on file  Other Topics Concern  . Not on file  Social History Narrative   Lives in [place]   Family History  Problem Relation Age of Onset  . Stroke Mother   . Diabetes Mellitus II Sister   . Breast cancer Sister    Scheduled Meds: . aspirin EC  81 mg Oral Daily  . azelastine  1 spray Each Nare Daily  . citalopram  20 mg Oral QHS  . enoxaparin (LOVENOX) injection  30 mg Subcutaneous QHS  . [START ON 06/05/2019] ezetimibe  5 mg Oral Once per day on Sun Sat  . furosemide  80 mg Intravenous BID  .  isosorbide mononitrate  30 mg Oral Daily  . losartan  12.5 mg Oral BID  . mexiletine  150 mg Oral BID  . mometasone-formoterol  2 puff Inhalation BID  . omega-3 acid ethyl esters  2 g Oral QHS  . pantoprazole  40 mg Oral Daily  . polyethylene glycol  17 g Oral QHS  . potassium chloride SA  40  mEq Oral Daily  . spironolactone  25 mg Oral Daily   Continuous Infusions: . DOBUTamine 5 mcg/kg/min (05/28/2019 2130)   PRN Meds:.acetaminophen, albuterol, clonazePAM, loratadine, nitroGLYCERIN, ondansetron Medications Prior to Admission:  Prior to Admission medications   Medication Sig Start Date End Date Taking? Authorizing Provider  acetaminophen (TYLENOL ARTHRITIS PAIN) 650 MG CR tablet Take 650 mg by mouth every 8 (eight) hours as needed for pain.    Yes [provider]  albuterol (PROAIR HFA) 108 (90 Base) MCG/ACT inhaler Inhale 2 puffs into the lungs every 6 (six) hours as needed for wheezing.  01/12/18  Yes [provider]  aspirin EC 81 MG tablet Take 81 mg by mouth daily.   Yes [provider]  azelastine (ASTELIN) 0.1 % nasal spray Place 1 spray into both nostrils daily.    Yes [provider]  budesonide-formoterol (SYMBICORT) 160-4.5 MCG/ACT inhaler Inhale 2 puffs into the lungs daily.  10/14/17  Yes [provider]  citalopram (CELEXA) 20 MG tablet Take 20 mg by mouth at bedtime.  12/16/17  Yes [provider]  clonazePAM (KLONOPIN) 0.5 MG tablet Take 0.5 mg by mouth 2 (two) times daily as needed for anxiety.  11/07/18  Yes [provider]  ezetimibe (ZETIA) 10 MG tablet TAKE 1/2 TABLET TWICE A WEEK Patient taking differently: Take 5 mg by mouth 2 (two) times a week. Saturday and Sunday 11/07/18  Yes Bensimhon, Shaune Pascal, MD  Famotidine (PEPCID PO) Take 1 tablet by mouth 3 (three) times daily as needed (itching).   Yes [provider]  fish oil-omega-3 fatty acids 1000 MG capsule Take 2 g by mouth at bedtime.    Yes  [provider]  hydrocortisone cream 1 % Apply 1 application topically 4 (four) times daily as needed for itching.   Yes [provider]  isosorbide mononitrate (IMDUR) 30 MG 24 hr tablet TAKE 1 TABLET BY MOUTH EVERY DAY Patient taking differently: Take 30 mg by mouth daily.  08/02/18  Yes Clegg, Amy D, NP  loratadine (CLARITIN) 10 MG tablet Take 10 mg by mouth daily as needed (seasonal allergies).    Yes [provider]  losartan (COZAAR) 25 MG tablet TAKE 0.5 TABLETS (12.5 MG TOTAL) BY MOUTH 2 (TWO) TIMES DAILY. 07/27/18  Yes Bensimhon, Shaune Pascal, MD  metolazone (ZAROXOLYN) 2.5 MG tablet Take 1 tablet (2.5 mg total) by mouth as needed for up to 10 days. Patient taking differently: Take 2.5 mg by mouth every Friday.  01/20/19 06/06/19 Yes Bensimhon, Shaune Pascal, MD  mexiletine (MEXITIL) 150 MG capsule Take 1 capsule (150 mg total) by mouth 2 (two) times daily. 07/06/18  Yes Clegg, Amy D, NP  milrinone (PRIMACOR) 20 MG/100 ML SOLN infusion Inject 0.013 mg/min into the vein continuous. 12/23/18  Yes Georgiana Shore, NP  nitroGLYCERIN (NITROSTAT) 0.4 MG SL tablet Place 1 tablet (0.4 mg total) under the tongue every 5 (five) minutes as needed for chest pain. 10/28/16  Yes Josue Hector, MD  omeprazole (PRILOSEC) 40 MG capsule Take 40 mg by mouth daily.    Yes [provider]  polyethylene glycol (MIRALAX / GLYCOLAX) packet Take 17 g by mouth daily as needed for mild constipation (taking in the PM).    Yes [provider]  potassium chloride SA (K-DUR) 20 MEQ tablet Take 2 tablets (40 mEq total) by mouth daily. 05/16/19  Yes Clegg, Amy D, NP  rosuvastatin (CRESTOR) 5 MG tablet TAKE 1 TABLET BY MOUTH ON  MON/WED/FRI Patient taking differently: Take 5 mg by mouth 3 (three) times a week. TAKE 1 TABLET BY MOUTH ON MON/WED/FRI 06/05/2019  Yes Larey Dresser, MD  spironolactone (ALDACTONE) 25 MG tablet TAKE 1 TABLET BY MOUTH EVERY DAY 05/29/19  Yes Bensimhon, Shaune Pascal, MD   torsemide (DEMADEX) 20 MG tablet Taking 80 mg in am/60 mg in pm 05/09/19  Yes Clegg, Amy D, NP   Allergies  Allergen Reactions  . Prednisone Shortness Of Breath, Swelling, Rash and Other (See Comments)    Sore throat, also  . Zebeta [Bisoprolol Fumarate] Other (See Comments)    Caused confusion  . Eggs Or Egg-Derived Products Other (See Comments)    Was told she is allergic to EGG WHITES  . Penicillins Itching and Rash    Did it involve swelling of the face/tongue/throat, SOB, or low BP? No Did it involve sudden or severe rash/hives, skin peeling, or any reaction on the inside of your mouth or nose? No Did you need to seek medical attention at a hospital or doctor's office? No When did it last happen?68 years old If all above answers are "NO", may proceed with cephalosporin use.  . Statins Other (See Comments)    Gradually tired with daily Lipitor, made pt feel badly (Pt can take Crestor 5 mg 4 times per week and Zetia 5 mg two times a week)   Review of Systems  Respiratory: Positive for shortness of breath.   Gastrointestinal: Positive for nausea.  Neurological: Positive for weakness.   Physical Exam Vitals signs and nursing note reviewed.  Constitutional:      General: She is awake.     Appearance: She is ill-appearing.  HENT:     Head: Normocephalic and atraumatic.  Cardiovascular:     Rate and Rhythm: Normal rate.  Pulmonary:     Effort: No tachypnea, accessory muscle usage or respiratory distress.     Breath sounds: Normal breath sounds.  Abdominal:     Tenderness: There is no abdominal tenderness.  Skin:    General: Skin is warm and dry.  Neurological:     Mental Status: She is alert and oriented to person, place, and time.  Psychiatric:        Mood and Affect: Mood is anxious.        Speech: Speech normal.        Cognition and Memory: Cognition normal.    Vital Signs: BP (!) 85/53 (BP Location: Right Arm)   Pulse 90   Temp 97.8 F (36.6 C) (Oral)    Resp (!) 24   Ht '5\' 3"'$  (1.6 m)   Wt 52.5 kg Comment: scale A  SpO2 99%   BMI 20.50 kg/m  Pain Scale: 0-10 POSS *See Group Information*: 1-Acceptable,Awake and alert Pain Score: 0-No pain   SpO2: SpO2: 99 % O2 Device:SpO2: 99 % O2 Flow Rate: .   IO: Intake/output summary:   Intake/Output Summary (Last 24 hours) at 06/01/2019 1653 Last data filed at 06/01/2019 1300 Gross per 24 hour  Intake 942.54 ml  Output 425 ml  Net 517.54 ml    LBM: Last BM Date: 06/01/2019 Baseline Weight: Weight: 53.9 kg Most recent weight: Weight: 52.5 kg(scale A)     Palliative Assessment/Data: PPS 50%   Flowsheet Rows     Most Recent Value  Intake Tab  Referral Department  Cardiology  Unit at Time of Referral  Intermediate Care Unit  Palliative Care Primary Diagnosis  Cardiac  Date Notified  05/31/19  Palliative Care Type  New Palliative care  Reason for referral  Clarify Goals of Care  Date first seen by Palliative Care  06/01/19  # of days Palliative referral response time  1 Day(s)  Clinical Assessment  Palliative Performance Scale Score  50%  Psychosocial & Spiritual Assessment  Palliative Care Outcomes  Patient/Family meeting held?  Yes  Who was at the meeting?  patient and spoke with daughter via telephone  Palliative Care Outcomes  Clarified goals of care, Counseled regarding hospice, Provided end of life care assistance, Improved pain interventions, Improved non-pain symptom therapy, Provided advance care planning, Provided psychosocial or spiritual support, ACP counseling assistance      Time In/Out: 1030-1120, 3014-9969 Time Total: 183mn Greater than 50%  of this time was spent counseling and coordinating care related to the above assessment and plan.  Signed by:  MIhor Dow FNP-C Palliative Medicine Team  Phone: [phone]: [phone]  Please contact Palliative Medicine Team phone at [phone] questions and concerns.  For individual provider: See AShea Evans

## 2019-06-01 NOTE — Progress Notes (Signed)
Unable to draw blood via central line.  Flushed line with 20cc NS and still unable to draw labs.  Phlebotomy notified.

## 2019-06-01 NOTE — Progress Notes (Signed)
Advanced Heart Failure Rounding Note  PCP-Cardiologist: Jenkins Rouge, MD   Subjective:     Milrinone was stopped on admit with low co-ox (40%) and dobutamine 5 mcg started.   CO-OX improved from 40%>60%. Remains on IV lasix. Unable to get labs from PICC this am  Feels ok today. Less SOB. Good diuresis with weight down another pound to 115 (baseline about 112) No orthopnea or PND  Objective:   Weight Range: 52.5 kg Body mass index is 20.5 kg/m.   Vital Signs:   Temp:  [97.4 F (36.3 C)-98.5 F (36.9 C)] 97.9 F (36.6 C) (06/25 0330) Pulse Rate:  [89-95] 92 (06/25 0330) Resp:  [16-20] 20 (06/25 0330) BP: (80-97)/(50-58) 87/55 (06/25 0330) SpO2:  [99 %-100 %] 100 % (06/25 0330) Weight:  [52.5 kg] 52.5 kg (06/25 0330) Last BM Date: 05/21/2019  Weight change: Filed Weights   05/31/2019 1858 05/31/19 0329 06/01/19 0330  Weight: 53.9 kg 52.8 kg 52.5 kg    Intake/Output:   Intake/Output Summary (Last 24 hours) at 06/01/2019 0623 Last data filed at 06/01/2019 0500 Gross per 24 hour  Intake -  Output 950 ml  Net -950 ml      Physical Exam   General:  Weak appearing. No resp difficulty HEENT: normal Neck: supple. no JVD. RIJ cath Carotids 2+ bilat; no bruits. No lymphadenopathy or thryomegaly appreciated. Cor: PMI nondisplaced. Regular rate & rhythm.+s3 Lungs: clear with decreased BS throughout Abdomen: soft, nontender, nondistended. No hepatosplenomegaly. No bruits or masses. Good bowel sounds. Extremities: no cyanosis, clubbing, rash, edema Neuro: alert & orientedx3, cranial nerves grossly intact. moves all 4 extremities w/o difficulty. Affect pleasant   Telemetry  Sinus 90s personally reviewed.    EKG    N/A   Labs    CBC Recent Labs    05/10/2019 2053 05/31/19 1600  WBC 6.9 5.7  HGB 11.0* 10.4*  HCT 33.6* 31.9*  MCV 96.6 96.4  PLT 194 638   Basic Metabolic Panel Recent Labs    05/23/2019 2053 05/31/19 0518  NA 134* 135  K 4.9 3.9  CL 99 98   CO2 25 25  GLUCOSE 128* 91  BUN 54* 50*  CREATININE 2.87* 2.21*  CALCIUM 9.3 9.2   Liver Function Tests No results for input(s): AST, ALT, ALKPHOS, BILITOT, PROT, ALBUMIN in the last 72 hours. No results for input(s): LIPASE, AMYLASE in the last 72 hours. Cardiac Enzymes No results for input(s): CKTOTAL, CKMB, CKMBINDEX, TROPONINI in the last 72 hours.  BNP: BNP (last 3 results) Recent Labs    05/08/19 0634 05/17/19 1040 05/11/2019 2053  BNP 1,026.9* 655.9* 1,314.5*    ProBNP (last 3 results) No results for input(s): PROBNP in the last 8760 hours.   D-Dimer No results for input(s): DDIMER in the last 72 hours. Hemoglobin A1C No results for input(s): HGBA1C in the last 72 hours. Fasting Lipid Panel No results for input(s): CHOL, HDL, LDLCALC, TRIG, CHOLHDL, LDLDIRECT in the last 72 hours. Thyroid Function Tests No results for input(s): TSH, T4TOTAL, T3FREE, THYROIDAB in the last 72 hours.  Invalid input(s): FREET3  Other results:   Imaging    No results found.   Medications:     Scheduled Medications: . aspirin EC  81 mg Oral Daily  . azelastine  1 spray Each Nare Daily  . citalopram  20 mg Oral QHS  . enoxaparin (LOVENOX) injection  30 mg Subcutaneous QHS  . [START ON 07/01/19] ezetimibe  5 mg Oral Once per  day on Sun Sat  . furosemide  80 mg Intravenous BID  . isosorbide mononitrate  30 mg Oral Daily  . losartan  12.5 mg Oral BID  . mexiletine  150 mg Oral BID  . mometasone-formoterol  2 puff Inhalation BID  . omega-3 acid ethyl esters  2 g Oral QHS  . pantoprazole  40 mg Oral Daily  . polyethylene glycol  17 g Oral QHS  . potassium chloride SA  40 mEq Oral Daily  . spironolactone  25 mg Oral Daily    Infusions: . DOBUTamine 5 mcg/kg/min (05/12/2019 2130)    PRN Medications: acetaminophen, albuterol, loratadine, nitroGLYCERIN    Patient Profile   Terri E Biggsis a 68 y.o.femalewith a history of CAD, s/p prior Ant MI, s/p prior POBA to  LAD, Dx, RCA, CHF due to ICM with high scar burden by MRI (12/09), s/p ICD, HTN, HL, VT, depression,andtobacco abuse.  Admitted on home milrione 0.25 mcg with marked volume overload.   Assessment/Plan   1. A/C Systolic Heart Failure, ICM on milrinone 0.25 mcg.  - Most recent ECHO 01/2019 EF 15-20%.  - Initial CO-OX 40%. Milrinone was stopped. Switched to dobutamine 5 mcg. Co-ox 60% on 6/24 - Unable to get co-ox of PICC today. Will need to have PICC team evalaute - Diuresing well. Weight coming down to 115 pounds. Baseline weight 112-113 pounds.  - Continue IV lasix 80 mg twice a day for one more day if renal function tolerates - No bb with low output.  - Off dig with recent elevated level.  - Continue spiro and losartan.  - Has been turned down for LVAD due to multiple co-morbidities including AKI and CKD. I d/w [facility] yesterday who also felt she would not be VAD candidate. Palliative care consult placed.  - Can consider trial of sending home on dobutamine 5 or 7.5 but we discussed fact that it may not last long  2. 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 - BMET pending this am - Baseline ~2.0   3. COPD Quit smoking 12/2018  4. H/O VT with ICD firing in setting of hypokalemia on 05/06/19 -No driving for 6 months - VT quiescent - Watch electrolytes closely  5. CAD s/p previous anterior MI: Cath 8/15 nonobstructive CAD.  - Cath 7/19 non-obstructive CAD.  - Continue ASA and statin. Continue imdur.  - No s/s ischemia.  6. PAD with Intermittent claudication: Medical therapy.  - Moderate bilateral calf claudication worse on the left side due to known SFA disease. This has progressed over past few months  - Had peripheral vascular catheterization 03/02/18 with no significant aortoiliac disease. Diffuse 60-70% disease affecting the left proximal and mid SFA with short occlusion distally with reconstitution via collaterals from the profunda. Three-vessel  runoff below the knee.  - Continue Risk factor management and smoking cessation.No change.  7. Hyperlipidemia with intolerance of multiple cholesterol meds: - Tolerating Crestor 5 mg three times/week and Zetia 5mg  two times weekly.No change.  Possibly home in next day or two with Hospice versus switch of home milrinone to dobutamine  Length of Stay: 2  Glori Bickers, MD  06/01/2019, 6:23 AM  Advanced Heart Failure Team Pager [phone] (M-F; 7a - 4p)  Please contact [facility] for night-coverage after hours (4p -7a ) and weekends on [email or web address]

## 2019-06-02 DIAGNOSIS — R570 Cardiogenic shock: Secondary | ICD-10-CM

## 2019-06-02 LAB — BASIC METABOLIC PANEL
Anion gap: 12 (ref 5–15)
BUN: 56 mg/dL — ABNORMAL HIGH (ref 8–23)
CO2: 23 mmol/L (ref 22–32)
Calcium: 9.2 mg/dL (ref 8.9–10.3)
Chloride: 94 mmol/L — ABNORMAL LOW (ref 98–111)
Creatinine, Ser: 2.65 mg/dL — ABNORMAL HIGH (ref 0.44–1.00)
GFR calc Af Amer: 20 mL/min — ABNORMAL LOW (ref >60)
GFR calc non Af Amer: 18 mL/min — ABNORMAL LOW (ref >60)
Glucose, Bld: 215 mg/dL — ABNORMAL HIGH (ref 70–99)
Potassium: 4.7 mmol/L (ref 3.5–5.1)
Sodium: 129 mmol/L — ABNORMAL LOW (ref 135–145)

## 2019-06-02 LAB — COOXEMETRY PANEL
Carboxyhemoglobin: 1 % (ref 0.5–1.5)
Methemoglobin: 0.5 % (ref 0.0–1.5)
O2 Saturation: 41.8 %
Total hemoglobin: 10.3 g/dL — ABNORMAL LOW (ref 12.0–16.0)

## 2019-06-02 MED ORDER — LORAZEPAM 2 MG/ML IJ SOLN
0.5000 mg | INTRAMUSCULAR | Status: DC | PRN
  Administered 2019-06-03: 0.5 mg via INTRAVENOUS
  Filled 2019-06-02: qty 1

## 2019-06-02 MED ORDER — GLYCOPYRROLATE 0.2 MG/ML IJ SOLN: 0.2000 mg | INTRAMUSCULAR | Status: DC | PRN

## 2019-06-02 MED ORDER — MORPHINE SULFATE (PF) 4 MG/ML IV SOLN
4.0000 mg | Freq: Once | INTRAVENOUS | Status: AC
  Administered 2019-06-02: 4 mg via INTRAVENOUS
  Filled 2019-06-02: qty 1

## 2019-06-02 MED ORDER — MORPHINE SULFATE (PF) 2 MG/ML IV SOLN
1.0000 mg | INTRAVENOUS | Status: DC | PRN
  Administered 2019-06-02 (×3): 2 mg via INTRAVENOUS
  Filled 2019-06-02 (×3): qty 1

## 2019-06-02 MED ORDER — ONDANSETRON HCL 4 MG/2ML IJ SOLN: 4.0000 mg | Freq: Four times a day (QID) | INTRAMUSCULAR | Status: DC | PRN

## 2019-06-02 MED ORDER — MORPHINE 100MG IN NS 100ML (1MG/ML) PREMIX INFUSION
0.0000 mg/h | INTRAVENOUS | Status: DC
  Administered 2019-06-03: 1 mg/h via INTRAVENOUS

## 2019-06-02 MED ORDER — MORPHINE 100MG IN NS 100ML (1MG/ML) PREMIX INFUSION
1.0000 mg/h | INTRAVENOUS | Status: DC
  Filled 2019-06-02 (×3): qty 100

## 2019-06-02 MED ORDER — CLONAZEPAM 0.5 MG PO TABS: 0.5000 mg | ORAL_TABLET | Freq: Two times a day (BID) | ORAL | Status: DC | PRN

## 2019-06-02 MED ORDER — SIMETHICONE 40 MG/0.6ML PO SUSP
80.0000 mg | Freq: Four times a day (QID) | ORAL | Status: DC | PRN
  Filled 2019-06-02: qty 1.2

## 2019-06-02 MED ORDER — MIDAZOLAM HCL 2 MG/2ML IJ SOLN
2.0000 mg | Freq: Once | INTRAMUSCULAR | Status: AC
  Administered 2019-06-02: 2 mg via INTRAVENOUS
  Filled 2019-06-02: qty 2

## 2019-06-02 MED ORDER — MIDAZOLAM 50MG/50ML (1MG/ML) PREMIX INFUSION: 0.0000 mg/h | INTRAVENOUS | Status: DC

## 2019-06-02 NOTE — Progress Notes (Signed)
Versed 2 mg and morphine 4 mg IV given to patient.  Patient's BP remains at 83/50 (58) with HR in 60-70's.  Patient appears relaxed and is resting comfortably now.  Will continue to monitor patient closely.

## 2019-06-02 NOTE — Progress Notes (Signed)
Called by RN re: increasing pain  Pt pain is not being controlled by prn MSO4.  Will start morphine gtt, ok to use prn morphine for breakthrough pain until gtt is titrated to effective.  Rosaria Ferries, PA-C 06/02/2019 7:42 PM Beeper [phone]

## 2019-06-02 NOTE — Care Management (Addendum)
CM acknowledges consult for home with hospice on dobutamine  CM contacted [facility] regarding dobutamine in the home - agency will review chart to determine if they will accept  [facility] also reviewing chart   Update:  CM informed that pt is a expected hospital death

## 2019-06-02 NOTE — Progress Notes (Signed)
Received verbal orders from Spray MD regarding pain management for patient.  2 mg of Versed bolus ordered and 4 mg of morphine bolus ordered now.  MD will order Versed gtt to be started at 3 mg and morphine gtt to be started at 5 mg.  Orders read back and confirmed.  Will continue to monitor patient and ensure she is comfortable. Family is currently at the bedside.

## 2019-06-02 NOTE — Plan of Care (Signed)
Problem: Education: Goal: Ability to demonstrate management of disease process will improve 06/02/2019 0954 by Don Perking, RN Outcome: Progressing 06/02/2019 0954 by Don Perking, RN Outcome: Progressing Goal: Ability to verbalize understanding of medication therapies will improve 06/02/2019 0954 by Don Perking, RN Outcome: Progressing 06/02/2019 0954 by Don Perking, RN Outcome: Progressing Goal: Individualized Educational Video(s) 06/02/2019 0954 by Don Perking, RN Outcome: Progressing 06/02/2019 0954 by Don Perking, RN Outcome: Progressing   Problem: Activity: Goal: Capacity to carry out activities will improve 06/02/2019 0954 by Don Perking, RN Outcome: Progressing 06/02/2019 0954 by Don Perking, RN Outcome: Progressing   Problem: Cardiac: Goal: Ability to achieve and maintain adequate cardiopulmonary perfusion will improve 06/02/2019 0954 by Don Perking, RN Outcome: Progressing 06/02/2019 0954 by Don Perking, RN Outcome: Progressing   Problem: Education: Goal: Knowledge of General Education information will improve Description: Including pain rating scale, medication(s)/side effects and non-pharmacologic comfort measures 06/02/2019 0954 by Don Perking, RN Outcome: Progressing 06/02/2019 0954 by Don Perking, RN Outcome: Progressing   Problem: Health Behavior/Discharge Planning: Goal: Ability to manage health-related needs will improve 06/02/2019 0954 by Don Perking, RN Outcome: Progressing 06/02/2019 0954 by Don Perking, RN Outcome: Progressing   Problem: Clinical Measurements: Goal: Ability to maintain clinical measurements within normal limits will improve 06/02/2019 0954 by Don Perking, RN Outcome: Progressing 06/02/2019 0954 by Don Perking, RN Outcome: Progressing Goal: Will remain free from infection 06/02/2019 0954 by Don Perking, RN Outcome:  Progressing 06/02/2019 0954 by Don Perking, RN Outcome: Progressing Goal: Diagnostic test results will improve 06/02/2019 0954 by Don Perking, RN Outcome: Progressing 06/02/2019 0954 by Don Perking, RN Outcome: Progressing Goal: Respiratory complications will improve 06/02/2019 0954 by Don Perking, RN Outcome: Progressing 06/02/2019 0954 by Don Perking, RN Outcome: Progressing Goal: Cardiovascular complication will be avoided 06/02/2019 0954 by Don Perking, RN Outcome: Progressing 06/02/2019 0954 by Don Perking, RN Outcome: Progressing   Problem: Activity: Goal: Risk for activity intolerance will decrease 06/02/2019 0954 by Don Perking, RN Outcome: Progressing 06/02/2019 0954 by Don Perking, RN Outcome: Progressing   Problem: Nutrition: Goal: Adequate nutrition will be maintained 06/02/2019 0954 by Don Perking, RN Outcome: Progressing 06/02/2019 0954 by Don Perking, RN Outcome: Progressing   Problem: Coping: Goal: Level of anxiety will decrease 06/02/2019 0954 by Don Perking, RN Outcome: Progressing 06/02/2019 0954 by Don Perking, RN Outcome: Progressing   Problem: Elimination: Goal: Will not experience complications related to bowel motility 06/02/2019 0954 by Don Perking, RN Outcome: Progressing 06/02/2019 0954 by Don Perking, RN Outcome: Progressing Goal: Will not experience complications related to urinary retention 06/02/2019 0954 by Don Perking, RN Outcome: Progressing 06/02/2019 0954 by Don Perking, RN Outcome: Progressing   Problem: Pain Managment: Goal: General experience of comfort will improve 06/02/2019 0954 by Don Perking, RN Outcome: Progressing 06/02/2019 0954 by Don Perking, RN Outcome: Progressing   Problem: Safety: Goal: Ability to remain free from injury will improve 06/02/2019 0954 by Don Perking, RN Outcome:  Progressing 06/02/2019 0954 by Don Perking, RN Outcome: Progressing   Problem: Skin Integrity: Goal: Risk for impaired skin integrity will decrease 06/02/2019 0954 by Don Perking, RN Outcome: Progressing 06/02/2019 0954 by Don Perking, RN Outcome: Progressing   Problem: Education: Goal: Knowledge of the prescribed therapeutic regimen will improve Outcome: Progressing   Problem:  Coping: Goal: Ability to identify and develop effective coping behavior will improve Outcome: Progressing   Problem: Clinical Measurements: Goal: Quality of life will improve Outcome: Progressing   Problem: Respiratory: Goal: Verbalizations of increased ease of respirations will increase Outcome: Progressing   Problem: Role Relationship: Goal: Family's ability to cope with current situation will improve Outcome: Progressing Goal: Ability to verbalize concerns, feelings, and thoughts to partner or family member will improve Outcome: Progressing   Problem: Pain Management: Goal: Satisfaction with pain management regimen will improve Outcome: Progressing   Problem: Education: Goal: Ability to demonstrate management of disease process will improve 06/02/2019 0954 by Don Perking, RN Outcome: Progressing 06/02/2019 0954 by Don Perking, RN Outcome: Progressing Goal: Ability to verbalize understanding of medication therapies will improve 06/02/2019 0954 by Don Perking, RN Outcome: Progressing 06/02/2019 0954 by Don Perking, RN Outcome: Progressing Goal: Individualized Educational Video(s) 06/02/2019 0954 by Don Perking, RN Outcome: Progressing 06/02/2019 0954 by Don Perking, RN Outcome: Progressing   Problem: Activity: Goal: Capacity to carry out activities will improve 06/02/2019 0954 by Don Perking, RN Outcome: Progressing 06/02/2019 0954 by Don Perking, RN Outcome: Progressing   Problem: Cardiac: Goal: Ability to achieve  and maintain adequate cardiopulmonary perfusion will improve 06/02/2019 0954 by Don Perking, RN Outcome: Progressing 06/02/2019 0954 by Don Perking, RN Outcome: Progressing   Problem: Education: Goal: Knowledge of General Education information will improve Description: Including pain rating scale, medication(s)/side effects and non-pharmacologic comfort measures 06/02/2019 0954 by Don Perking, RN Outcome: Progressing 06/02/2019 0954 by Don Perking, RN Outcome: Progressing   Problem: Health Behavior/Discharge Planning: Goal: Ability to manage health-related needs will improve 06/02/2019 0954 by Don Perking, RN Outcome: Progressing 06/02/2019 0954 by Don Perking, RN Outcome: Progressing   Problem: Clinical Measurements: Goal: Ability to maintain clinical measurements within normal limits will improve 06/02/2019 0954 by Don Perking, RN Outcome: Progressing 06/02/2019 0954 by Don Perking, RN Outcome: Progressing Goal: Will remain free from infection 06/02/2019 0954 by Don Perking, RN Outcome: Progressing 06/02/2019 0954 by Don Perking, RN Outcome: Progressing Goal: Diagnostic test results will improve 06/02/2019 0954 by Don Perking, RN Outcome: Progressing 06/02/2019 0954 by Don Perking, RN Outcome: Progressing Goal: Respiratory complications will improve 06/02/2019 0954 by Don Perking, RN Outcome: Progressing 06/02/2019 0954 by Don Perking, RN Outcome: Progressing Goal: Cardiovascular complication will be avoided 06/02/2019 0954 by Don Perking, RN Outcome: Progressing 06/02/2019 0954 by Don Perking, RN Outcome: Progressing   Problem: Activity: Goal: Risk for activity intolerance will decrease 06/02/2019 0954 by Don Perking, RN Outcome: Progressing 06/02/2019 0954 by Don Perking, RN Outcome: Progressing   Problem: Nutrition: Goal: Adequate nutrition will be  maintained 06/02/2019 0954 by Don Perking, RN Outcome: Progressing 06/02/2019 0954 by Don Perking, RN Outcome: Progressing   Problem: Coping: Goal: Level of anxiety will decrease 06/02/2019 0954 by Don Perking, RN Outcome: Progressing 06/02/2019 0954 by Don Perking, RN Outcome: Progressing   Problem: Elimination: Goal: Will not experience complications related to bowel motility 06/02/2019 0954 by Don Perking, RN Outcome: Progressing 06/02/2019 0954 by Don Perking, RN Outcome: Progressing Goal: Will not experience complications related to urinary retention 06/02/2019 0954 by Don Perking, RN Outcome: Progressing 06/02/2019 0954 by Don Perking, RN Outcome: Progressing   Problem: Pain Managment: Goal: General experience of comfort will improve 06/02/2019 0954 by Pecolia Ades  Y, RN Outcome: Progressing 06/02/2019 0954 by Don Perking, RN Outcome: Progressing   Problem: Safety: Goal: Ability to remain free from injury will improve 06/02/2019 0954 by Don Perking, RN Outcome: Progressing 06/02/2019 0954 by Don Perking, RN Outcome: Progressing   Problem: Skin Integrity: Goal: Risk for impaired skin integrity will decrease 06/02/2019 0954 by Don Perking, RN Outcome: Progressing 06/02/2019 0954 by Don Perking, RN Outcome: Progressing   Problem: Education: Goal: Knowledge of the prescribed therapeutic regimen will improve Outcome: Progressing   Problem: Coping: Goal: Ability to identify and develop effective coping behavior will improve Outcome: Progressing   Problem: Clinical Measurements: Goal: Quality of life will improve Outcome: Progressing   Problem: Respiratory: Goal: Verbalizations of increased ease of respirations will increase Outcome: Progressing   Problem: Role Relationship: Goal: Family's ability to cope with current situation will improve Outcome: Progressing Goal:  Ability to verbalize concerns, feelings, and thoughts to partner or family member will improve Outcome: Progressing   Problem: Pain Management: Goal: Satisfaction with pain management regimen will improve Outcome: Progressing

## 2019-06-02 NOTE — Progress Notes (Addendum)
Advanced Heart Failure Rounding Note  PCP-Cardiologist: Jenkins Rouge, MD   Subjective:     Milrinone was stopped on admit with low co-ox (40%) and dobutamine 5 mcg started. CO-OX has gone back down to 40%.   Palliative Care consulted. She is DNR/DNI and interested in Hospice.   Complaining nausea and abdominal discomfort.     Objective:   Weight Range: 52.8 kg Body mass index is 20.62 kg/m.   Vital Signs:   Temp:  [97.7 F (36.5 C)-98.1 F (36.7 C)] 98.1 F (36.7 C) (06/26 0802) Pulse Rate:  [90-92] 90 (06/26 0802) Resp:  [14-25] 20 (06/26 0729) BP: (83-94)/(50-56) 83/54 (06/26 0802) SpO2:  [97 %-99 %] 98 % (06/26 0729) Weight:  [52.8 kg] 52.8 kg (06/26 0432) Last BM Date: 05/31/19  Weight change: Filed Weights   05/31/19 0329 06/01/19 0330 06/02/19 0432  Weight: 52.8 kg 52.5 kg 52.8 kg    Intake/Output:   Intake/Output Summary (Last 24 hours) at 06/02/2019 0808 Last data filed at 06/02/2019 0802 Gross per 24 hour  Intake 1698.31 ml  Output 410 ml  Net 1288.31 ml      Physical Exam  CVp 6-7  General:  Appears chronically ill.  No resp difficulty HEENT: normal Neck: supple. JVD 6-7 . Carotids 2+ bilat; no bruits. No lymphadenopathy or thryomegaly appreciated. Cor: PMI nondisplaced. Regular rate & rhythm. No rubs, gallops or murmurs. R upper chest tunneled cath.  Lungs: clear Abdomen: soft, nontender, nondistended. No hepatosplenomegaly. No bruits or masses. Good bowel sounds. Extremities: no cyanosis, clubbing, rash, edema Neuro: alert & orientedx3, cranial nerves grossly intact. moves all 4 extremities w/o difficulty. Affect pleasant   Telemetry  Sinus 90s personally reviewed.    EKG    N/A   Labs    CBC Recent Labs    05/23/2019 2053 05/31/19 1600  WBC 6.9 5.7  HGB 11.0* 10.4*  HCT 33.6* 31.9*  MCV 96.6 96.4  PLT 194 202   Basic Metabolic Panel Recent Labs    06/01/19 0547 06/02/19 0345  NA 133* 129*  K 4.5 4.7  CL 97* 94*   CO2 22 23  GLUCOSE 122* 215*  BUN 54* 56*  CREATININE 2.69* 2.65*  CALCIUM 9.5 9.2   Liver Function Tests No results for input(s): AST, ALT, ALKPHOS, BILITOT, PROT, ALBUMIN in the last 72 hours. No results for input(s): LIPASE, AMYLASE in the last 72 hours. Cardiac Enzymes No results for input(s): CKTOTAL, CKMB, CKMBINDEX, TROPONINI in the last 72 hours.  BNP: BNP (last 3 results) Recent Labs    05/08/19 0634 05/17/19 1040 05/27/2019 2053  BNP 1,026.9* 655.9* 1,314.5*    ProBNP (last 3 results) No results for input(s): PROBNP in the last 8760 hours.   D-Dimer No results for input(s): DDIMER in the last 72 hours. Hemoglobin A1C No results for input(s): HGBA1C in the last 72 hours. Fasting Lipid Panel No results for input(s): CHOL, HDL, LDLCALC, TRIG, CHOLHDL, LDLDIRECT in the last 72 hours. Thyroid Function Tests No results for input(s): TSH, T4TOTAL, T3FREE, THYROIDAB in the last 72 hours.  Invalid input(s): FREET3  Other results:   Imaging    No results found.   Medications:     Scheduled Medications: . aspirin EC  81 mg Oral Daily  . azelastine  1 spray Each Nare Daily  . citalopram  20 mg Oral QHS  . enoxaparin (LOVENOX) injection  30 mg Subcutaneous QHS  . [START ON 11-Jun-2019] ezetimibe  5 mg Oral Once per  day on Sun Sat  . furosemide  80 mg Intravenous BID  . isosorbide mononitrate  30 mg Oral Daily  . losartan  12.5 mg Oral BID  . mexiletine  150 mg Oral BID  . mometasone-formoterol  2 puff Inhalation BID  . omega-3 acid ethyl esters  2 g Oral QHS  . pantoprazole  40 mg Oral Daily  . polyethylene glycol  17 g Oral QHS  . potassium chloride SA  40 mEq Oral Daily  . spironolactone  25 mg Oral Daily    Infusions: . DOBUTamine 5 mcg/kg/min (06/02/19 0400)    PRN Medications: acetaminophen, albuterol, clonazePAM, loratadine, nitroGLYCERIN, ondansetron    Patient Profile   Nolan E Biggsis a 68 y.o.femalewith a history of CAD, s/p prior  Ant MI, s/p prior POBA to LAD, Dx, RCA, CHF due to ICM with high scar burden by MRI (12/09), s/p ICD, HTN, HL, VT, depression,andtobacco abuse.  Admitted on home milrione 0.25 mcg with marked volume overload.   Assessment/Plan   1. A/C Systolic Heart Failure, ICM - Most recent ECHO 01/2019 EF 15-20%. Has been turned down for LVAD due to multiple co-morbidities including AKI and CKD. Dr Haroldine Laws discussed with [facility]  who also felt she would not be VAD candidate.  - Initial CO-OX 40%. Milrinone was stopped. Switched to dobutamine 5 mcg. Co-ox 60% on 6/24 but has since gone back down to 40% for the last 2 days.  -CVP coming down. Continue IV lasix until we determine d/c paln. .  - No bb with low output.  - Off dig with recent elevated level.  - Continue spiro and losartan.  -She is interested in Hospice.   2. 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  3. COPD Quit smoking 12/2018  4. H/O VT with ICD firing in setting of hypokalemia on 05/06/19 -No driving for 6 months - VT quiescent - Watch electrolytes closely  5. CAD s/p previous anterior MI: Cath 8/15 nonobstructive CAD.  - Cath 7/19 non-obstructive CAD.  - Continue ASA and statin. Continue imdur.  - No s/s ischemia.  6. PAD with Intermittent claudication: Medical therapy.  - Moderate bilateral calf claudication worse on the left side due to known SFA disease. This has progressed over past few months  - Had peripheral vascular catheterization 03/02/18 with no significant aortoiliac disease. Diffuse 60-70% disease affecting the left proximal and mid SFA with short occlusion distally with reconstitution via collaterals from the profunda. Three-vessel runoff below the knee.  - Continue Risk factor management and smoking cessation.No change.  7. Hyperlipidemia with intolerance of multiple cholesterol meds: - Tolerating Crestor 5 mg three times/week and Zetia 5mg  two times weekly.No change.   8. DNR/DNI  Palliative Care appreciated. Long conversation with Mrs Bath. Ideally she would like to go home but she does not have 24 hour care. She says she does not want to burden her family. She is considering stopping dobutamine and focusing on symptom management. She is clearly end stage with limited time. I mentioned Opt[facility]rist versus home hospice.  I am not sure how much support she will have at home. Will follow up later today. I suspect if we stop dobutamine she will decompensate.    Will discuss with Dr Aundra Dubin and [place].   Length of Stay: 3  Amy Clegg, NP  06/02/2019, 8:08 AM  Advanced Heart Failure Team Pager [phone] (M-F; 7a - 4p)  Please contact [facility] for night-coverage  after hours (4p -7a ) and weekends on [email or web address]  Patient seen with NP, agree with the above note.   Currently doing very poorly.  Low co-ox despite dobutamine.  SBP down to 70s.  Not particularly short of breath but has developed abdominal pain.   On exam, JVP 14 cm, mild abdominal tenderness, 1+ edema to knees.    End stage low output HF without further options for treatment, now hypotensive.  We have been working on getting her to [facility] but I do not think she is going to be able to get there.  I suspect she will expire in the hospital.  Palliative care following, will have family come in.  Will start morphine gtt for abdominal pain which is likely mesenteric ischemia. After family has seen her, will plan to stop dobutamine and treat with full comfort measures.    Will deactivate ICD.   Loralie Champagne 06/02/2019 1:59 PM

## 2019-06-02 NOTE — Care Management Important Message (Signed)
Important Message  Patient Details  Name: [name] MRN: [id number] Date of Birth: 02-19-1951   Medicare Important Message Given:  Yes     Memory Argue 06/02/2019, 3:06 PM

## 2019-06-02 NOTE — Progress Notes (Addendum)
Chart reviewed. Patient had been planned for CRT upgrade but unfortunately was admitted with recurrent HF exacerbation and is now end stage. She is not a VAD candidate with co-morbidities. Palliative care team has seen and she is now DNR/DNI.  AHF team working on discharge planning ([facility] vs home hospice).   Discussed with patient today. Will plan to deactivate tachy therapies prior to discharge.   Discussed with AHF team.   Please call if needed.  [name], NP 06/02/2019 8:55 AM

## 2019-06-02 NOTE — Progress Notes (Signed)
   Family at bedside 2 sisters, daughter, grand children, and son-in-law.   We discussed that she is end stage and will not survive her hospitalization. Family verbalized understanding and appreciated the care the she has received.   We will wean off dobutamine.   ICD has been turned off. She is dnr/dni  [name] is having ongoing abdominal pain. We have started intermittent morphine and ativan. I will stop daily lab work.   Palliative appreciated.   Niyah Mamaril NP-C  4:35 PM

## 2019-06-02 NOTE — Progress Notes (Addendum)
Daily Progress Note   Patient Name: [name]       Date: 06/02/2019 DOB: [birth year]  Age: 68 y.o. MRN#: [id number] Attending Physician: Jolaine Artist, MD Primary Care Physician: Stephens Shire, MD Admit Date: 05/20/2019  Reason for Consultation/Follow-up: Establishing goals of care  Subjective/GOC:  Visited with patient x2 today. This morning, denies nausea, pain or dyspnea but states she feels "puney." Discussed GOC in regards to home hospice or hospice facility, explaining concern that symptoms will be challenging to manage at home, especially if dobutamine is discontinued. Patient is unsure what to do and leans on heart failure team to make the best decision for her. She speaks of being at peace with EOL and has known this was coming.   This afternoon, patient complaining of abdominal pain and with worsening blood pressure. Explained to patient worsening blood pressure despite dobutamine indicating poor prognosis and that CHF team and I have called her family in to visit with her at bedside. Explained focus on comfort and relief from suffering at EOL.  Spoke with daughter, Olive Bass via telephone to update on worsening blood pressure and symptoms. Explained that her mother may not leave the hospital and encouraged family to come to bedside as soon as possible. Answered questions and concerns for daughter.    ADDENDUM 1700: Family at bedside. They have spoken with Darrick Grinder NP and understand diagnoses and poor prognosis. Discussed plan to focus on comfort and symptom management. Patient states pain is a 4/10 but does not currently want another dose of morphine at this time. Encouraged patient/family to notify RN when patient needs more medication. Explained that morphine infusion can be  started if necessary in order to ensure relief from suffering. Answered questions for family. Emotional support provided.   Length of Stay: 3  Current Medications: Scheduled Meds:  . aspirin EC  81 mg Oral Daily  . azelastine  1 spray Each Nare Daily  . citalopram  20 mg Oral QHS  . enoxaparin (LOVENOX) injection  30 mg Subcutaneous QHS  . [START ON 2019/06/14] ezetimibe  5 mg Oral Once per day on Sun Sat  . furosemide  80 mg Intravenous BID  . isosorbide mononitrate  30 mg Oral Daily  . losartan  12.5 mg Oral BID  . mexiletine  150 mg Oral  BID  . mometasone-formoterol  2 puff Inhalation BID  . omega-3 acid ethyl esters  2 g Oral QHS  . pantoprazole  40 mg Oral Daily  . polyethylene glycol  17 g Oral QHS  . potassium chloride SA  40 mEq Oral Daily  . spironolactone  25 mg Oral Daily    Continuous Infusions: . DOBUTamine 5 mcg/kg/min (06/02/19 0400)    PRN Meds: acetaminophen, albuterol, clonazePAM, loratadine, nitroGLYCERIN, ondansetron  Physical Exam Vitals signs and nursing note reviewed.  Constitutional:      General: She is awake.  HENT:     Head: Normocephalic and atraumatic.  Cardiovascular:     Rate and Rhythm: Normal rate.  Pulmonary:     Effort: No tachypnea, accessory muscle usage or respiratory distress.  Skin:    General: Skin is warm and dry.     Coloration: Skin is pale.  Neurological:     Mental Status: She is alert and oriented to person, place, and time.  Psychiatric:        Mood and Affect: Mood normal.        Speech: Speech normal.        Behavior: Behavior normal.        Cognition and Memory: Cognition normal.            Vital Signs: BP (!) 83/54 (BP Location: Right Arm)   Pulse 90   Temp 98.1 F (36.7 C) (Other (Comment))   Resp 16   Ht 5\' 3"  (1.6 m)   Wt 52.8 kg   SpO2 98%   BMI 20.62 kg/m  SpO2: SpO2: 98 % O2 Device: O2 Device: Room Air O2 Flow Rate:    Intake/output summary:   Intake/Output Summary (Last 24 hours) at 06/02/2019  1050 Last data filed at 06/02/2019 0900 Gross per 24 hour  Intake 1470.43 ml  Output 410 ml  Net 1060.43 ml   LBM: Last BM Date: 05/31/19 Baseline Weight: Weight: 53.9 kg Most recent weight: Weight: 52.8 kg       Palliative Assessment/Data: PPS 50%    Flowsheet Rows     Most Recent Value  Intake Tab  Referral Department  Cardiology  Unit at Time of Referral  Intermediate Care Unit  Palliative Care Primary Diagnosis  Cardiac  Date Notified  05/31/19  Palliative Care Type  New Palliative care  Reason for referral  Clarify Goals of Care  Date first seen by Palliative Care  06/01/19  # of days Palliative referral response time  1 Day(s)  Clinical Assessment  Palliative Performance Scale Score  50%  Psychosocial & Spiritual Assessment  Palliative Care Outcomes  Patient/Family meeting held?  Yes  Who was at the meeting?  patient and spoke with daughter via telephone  Palliative Care Outcomes  Clarified goals of care, Counseled regarding hospice, Provided end of life care assistance, Improved pain interventions, Improved non-pain symptom therapy, Provided advance care planning, Provided psychosocial or spiritual support, ACP counseling assistance      Patient Active Problem List   Diagnosis Date Noted  . Palliative care by specialist   . Goals of care, counseling/discussion   . Cardiomyopathy, ischemic   . Chronic kidney disease (CKD), stage IV (severe) ([place]) 05/19/2019  . Syncope 05/07/2019  . Acute on chronic systolic (congestive) heart failure ([place]) 12/22/2018  . Pressure injury of skin 12/22/2018  . AKI (acute kidney injury) ([place])   . Abdominal pain 06/16/2018  . Hypokalemia 06/16/2018  . Rectal bleeding 06/16/2018  . Elevated  troponin 06/16/2018  . Iron deficiency anemia 06/16/2018  . Abnormal LFTs 06/16/2018  . Abdominal pain, RUQ   . Hyponatremia 06/08/2018  . Acute CHF (congestive heart failure) ([place]) 06/04/2018  . Acute on chronic systolic CHF (congestive  heart failure) ([place]) 06/04/2018  . Dizziness 07/30/2017  . Generalized anxiety disorder 04/08/2016  . Tenosynovitis, de Quervain 04/08/2016  . Acute renal failure superimposed on stage 3 chronic kidney disease ([place]) 03/31/2016  . Allergic rhinitis 02/06/2016  . Disease of nasal cavity and sinuses 02/06/2016  . Cardiomyopathy ([place]) 02/06/2016  . Asthma 02/06/2016  . Diaphragmatic hernia 02/06/2016  . Dyslipidemia 02/06/2016  . GERD (gastroesophageal reflux disease) 02/06/2016  . Primary snoring 02/06/2016  . CAD (coronary artery disease) 02/06/2016  . Intermittent claudication ([place]) 08/07/2014  . Bilateral carotid artery stenosis 08/07/2014  . Stenosis of carotid artery 08/07/2014  . Precordial chest pain 07/25/2014  . Chronic systolic heart failure ([place]) 10/10/2013  . Chronic obstructive pulmonary disease ([place]) 10/10/2013  . Hyperlipidemia 06/22/2013  . Leg pain 06/05/2013  . Implantable cardioverter-defibrillator (ICD) in situ 09/17/2011  . Congestive heart failure ([place]) 08/14/2011  . Tobacco abuse 01/30/2011  . Other symptoms involving cardiovascular system 01/30/2011  . Depression 05/01/2009  . Essential hypertension 05/01/2009  . MYOCARDIAL INFARCTION 05/01/2009  . Coronary atherosclerosis 05/01/2009  . CARDIOMYOPATHY, ISCHEMIC 05/01/2009  . Chronic ischemic heart disease 05/01/2009    Palliative Care Assessment & Plan   Patient Profile: 68 y.o. female  with past medical history of ICM EF 15-20%, home milrinone, systolic CHF, MI, HTN, HLD, CKD, COPD, past smoker, depression, PAD admitted on 06/05/2019 with shortness of breath and fluid overload. Patient is not a candidate for LVAD due to co-morbidities. Dr. Haroldine Laws also discussed LVAD with [facility], who did not feel she is a candidate with underlying co-morbidities. Milrinone stopped and switched to dobutamine infusion inpatient. Per attending, can consider trial of home dobutamine versus hospice services. Palliative medicine  consultation for goals of care.   Assessment: Acute on chronic systolic heart failure Ischemic cardiomyopathy EF 15-20% Acute on chronic CKD stage IV Hx VT with ICD firing CAD PAD with intermittent claudication Hypotension  Recommendations/Plan:  MOST form completed with patient and daughter 6/25. Patient's wishes include: DNR/DNI, comfort focused care, ABX/IVF for time trial if indicated, and NO feeding tube.  Clinical decline this afternoon. Patient unstable for transfer out of hospital to hospice facility. I have called family in to visit with her at bedside.   Transition to comfort focused care once family arrives and ready.   Comfort meds ordered. Low threshold for morphine infusion with new onset abdominal pain and per attending, likely mesenteric ischemia.  Deactivate ICD.   Code Status: DNR   Code Status Orders  (From admission, onward)         Start     Ordered   06/01/19 1547  Do not attempt resuscitation (DNR)  Continuous    Question Answer Comment  In the event of cardiac or respiratory ARREST Do not call a "code blue"   In the event of cardiac or respiratory ARREST Do not perform Intubation, CPR, defibrillation or ACLS   In the event of cardiac or respiratory ARREST Use medication by any route, position, wound care, and other measures to relive pain and suffering. May use oxygen, suction and manual treatment of airway obstruction as needed for comfort.      06/01/19 1547        Code Status History    Date Active Date  Inactive Code Status Order ID Comments User Context   05/07/2019 2353 05/09/2019 1806 Full Code [id number]  Meade Maw, MD Inpatient   12/22/2018 1202 12/23/2018 2312 Full Code [id number]  Jolaine Artist, MD Inpatient   06/16/2018 0128 06/28/2018 1651 Full Code [id number]  Ivor Costa, MD ED   06/04/2018 0237 06/08/2018 1601 Full Code [id number]  Self, Sherlyn Lees, MD ED   03/02/2018 1119 03/02/2018 1817 Full Code [id number]  Wellington Hampshire, MD  Inpatient   01/14/2018 1923 01/17/2018 1612 Full Code [id number]  Patrecia Pour, MD ED   07/26/2014 1248 07/26/2014 1954 Full Code [id number]  Bensimhon, Shaune Pascal, MD Inpatient   Advance Care Planning Activity    Advance Directive Documentation     Most Recent Value  Type of Advance Directive  Healthcare Power of Attorney  Pre-existing out of facility DNR order (yellow form or pink MOST form)  -  "MOST" Form in Place?  -       Prognosis:   Hours - Days  Discharge Planning:  Anticipated Hospital Death  Care plan was discussed with patient, daughter, RN, CHF team  Thank you for allowing the Palliative Medicine Team to assist in the care of this patient.   Time In: 1000- 1230- 1400- 1655-   Time Out: 3532 9924 2683 1705 Total Time 70 Prolonged Time Billed  no      Greater than 50%  of this time was spent counseling and coordinating care related to the above assessment and plan.  Ihor Dow, DNP, FNP-C Palliative Medicine Team  Phone: [phone] Fax: [phone]  Please contact Palliative Medicine Team phone at [phone] for questions and concerns.

## 2019-06-02 NOTE — Progress Notes (Signed)
Patient alert and states she is having "no pain".  Does not want to start the gtts at this time. Wants to "wait a little bit". Family has left bedside, except for daughter who will be staying the night.  Patient is comfortable with HR in 80's and BP if 90/54 (60).  Will continue to monitor and provide emotional support to patient and family.

## 2019-06-02 NOTE — Plan of Care (Signed)

## 2019-06-03 DIAGNOSIS — Z515 Encounter for palliative care: Secondary | ICD-10-CM

## 2019-06-03 DIAGNOSIS — R1084 Generalized abdominal pain: Secondary | ICD-10-CM

## 2019-06-03 DIAGNOSIS — Z7189 Other specified counseling: Secondary | ICD-10-CM

## 2019-06-05 ENCOUNTER — Telehealth (HOSPITAL_COMMUNITY): Payer: Self-pay

## 2019-06-05 ENCOUNTER — Encounter: Payer: Medicare Other | Admitting: Nurse Practitioner

## 2019-06-05 NOTE — Telephone Encounter (Signed)
[facility] test request was faxed over 06/02/2019

## 2019-06-07 NOTE — Progress Notes (Signed)
Confirmed w/hospital leadership that patients who are actively dying can have up to 4 visitors, only 2 at a time in the room d/t social distancing guidelines.  Family was under the impression via Palliative that they could have 6 family members visit in the room, and at the same time.  Hospital policy must be observed per [facility].  Family and patient all made aware of hospital policy to deter any additional confusion.  Patient is to be transferred to [facility] - Palliative care unit when bed is available.  Family to be notified of transfer when able.

## 2019-06-07 NOTE — Progress Notes (Signed)
Arrived to 6n10 from [place] at this time. 2 family members at bedside. Pt ambulated from wc to bed with 1 assist.

## 2019-06-07 NOTE — Progress Notes (Signed)
Patient states she is experiencing "quite a bit of pain". Would like to proceed with the morphine gtt at this time.  Morphine gtt started at 1 mg/hr - will be back in 30 minutes to titrate gtt accordingly. Patient HR is in the 80's and BP is 77/50 (60). RR is 19 at this time.  Will continue to closely monitor the patient and offer emotional support to patient and family member.

## 2019-06-07 NOTE — Progress Notes (Signed)
Daily Progress Note   Patient Name: [name]       Date: 2019-06-26 DOB: 01-12-1951  Age: 68 y.o. MRN#: [id number] Attending Physician: Jolaine Artist, MD Primary Care Physician: Stephens Shire, MD Admit Date: 05/28/2019  Reason for Consultation/Follow-up: Establishing goals of care  Subjective/GOC:  Patient is awake alert, sitting up in bed. Daughter at bedside  Patient did not rest well, was up for most of the night off and on.  Denies chest pain, denies shortness of breath, still has generalized abdominal pain going on  ICD has been d/c as per patient, still on Dobutamine, discussed with both patient and daughter, see below.    Scheduled Meds:  . aspirin EC  81 mg Oral Daily  . azelastine  1 spray Each Nare Daily  . citalopram  20 mg Oral QHS  . furosemide  80 mg Intravenous BID  . isosorbide mononitrate  30 mg Oral Daily  . mexiletine  150 mg Oral BID  . mometasone-formoterol  2 puff Inhalation BID  . pantoprazole  40 mg Oral Daily  . polyethylene glycol  17 g Oral QHS  . potassium chloride SA  40 mEq Oral Daily    Continuous Infusions: . midazolam Stopped (06/02/19 2223)  . morphine 4 mg/hr (June 26, 2019 0838)    PRN Meds: acetaminophen, albuterol, glycopyrrolate, loratadine, LORazepam, morphine injection, nitroGLYCERIN, ondansetron (ZOFRAN) IV, simethicone  Physical Exam Vitals signs and nursing note reviewed.  Constitutional:      General: She is awake.  HENT:     Head: Normocephalic and atraumatic.  Cardiovascular:     Rate and Rhythm: Normal rate.  Pulmonary:     Effort: No tachypnea, accessory muscle usage or respiratory distress.  Skin:    General: Skin is warm and dry.     Coloration: Skin is pale.  Neurological:     Mental Status: She is alert  and oriented to person, place, and time.  Psychiatric:        Mood and Affect: Mood normal.        Speech: Speech normal.        Behavior: Behavior normal.        Cognition and Memory: Cognition normal.    abdominal generalized discomfort No edema No coolness no mottling  Vital Signs: BP (!) 98/57 (BP Location: Right Arm)   Pulse 67   Temp 97.6 F (36.4 C) (Oral)   Resp 17   Ht 5\' 3"  (1.6 m)   Wt 52.8 kg   SpO2 97%   BMI 20.62 kg/m  SpO2: SpO2: 97 % O2 Device: O2 Device: Room Air O2 Flow Rate:    Intake/output summary:   Intake/Output Summary (Last 24 hours) at June 14, 2019 1962 Last data filed at 14-Jun-2019 0400 Gross per 24 hour  Intake 534.77 ml  Output 200 ml  Net 334.77 ml   LBM: Last BM Date: 06/02/2019 Baseline Weight: Weight: 53.9 kg Most recent weight: Weight: 52.8 kg       Palliative Assessment/Data: PPS 50%    Flowsheet Rows     Most Recent Value  Intake Tab  Referral Department  Cardiology  Unit at Time of Referral  Intermediate Care Unit  Palliative Care Primary Diagnosis  Cardiac  Date Notified  05/31/19  Palliative Care Type  New Palliative care  Reason for referral  Clarify Goals of Care  Date first seen by Palliative Care  06/01/19  # of days Palliative referral response time  1 Day(s)  Clinical Assessment  Palliative Performance Scale Score  50%  Psychosocial & Spiritual Assessment  Palliative Care Outcomes  Patient/Family meeting held?  Yes  Who was at the meeting?  patient and spoke with daughter via telephone  Palliative Care Outcomes  Clarified goals of care, Counseled regarding hospice, Provided end of life care assistance, Improved pain interventions, Improved non-pain symptom therapy, Provided advance care planning, Provided psychosocial or spiritual support, ACP counseling assistance      Patient Active Problem List   Diagnosis Date Noted  . Palliative care by specialist   . Goals of care, counseling/discussion   .  Cardiomyopathy, ischemic   . Chronic kidney disease (CKD), stage IV (severe) ([place]) 05/16/2019  . Syncope 05/07/2019  . Acute on chronic systolic (congestive) heart failure ([place]) 12/22/2018  . Pressure injury of skin 12/22/2018  . AKI (acute kidney injury) ([place])   . Abdominal pain 06/16/2018  . Hypokalemia 06/16/2018  . Rectal bleeding 06/16/2018  . Elevated troponin 06/16/2018  . Iron deficiency anemia 06/16/2018  . Abnormal LFTs 06/16/2018  . Abdominal pain, RUQ   . Hyponatremia 06/08/2018  . Acute CHF (congestive heart failure) ([place]) 06/04/2018  . Acute on chronic systolic CHF (congestive heart failure) ([place]) 06/04/2018  . Dizziness 07/30/2017  . Generalized anxiety disorder 04/08/2016  . Tenosynovitis, de Quervain 04/08/2016  . Acute renal failure superimposed on stage 3 chronic kidney disease ([place]) 03/31/2016  . Allergic rhinitis 02/06/2016  . Disease of nasal cavity and sinuses 02/06/2016  . Cardiomyopathy ([place]) 02/06/2016  . Asthma 02/06/2016  . Diaphragmatic hernia 02/06/2016  . Dyslipidemia 02/06/2016  . GERD (gastroesophageal reflux disease) 02/06/2016  . Primary snoring 02/06/2016  . CAD (coronary artery disease) 02/06/2016  . Intermittent claudication ([place]) 08/07/2014  . Bilateral carotid artery stenosis 08/07/2014  . Stenosis of carotid artery 08/07/2014  . Precordial chest pain 07/25/2014  . Chronic systolic heart failure ([place]) 10/10/2013  . Chronic obstructive pulmonary disease ([place]) 10/10/2013  . Hyperlipidemia 06/22/2013  . Leg pain 06/05/2013  . Implantable cardioverter-defibrillator (ICD) in situ 09/17/2011  . Congestive heart failure ([place]) 08/14/2011  . Tobacco abuse 01/30/2011  . Other symptoms involving cardiovascular system 01/30/2011  . Depression 05/01/2009  . Essential hypertension 05/01/2009  . MYOCARDIAL INFARCTION 05/01/2009  . Coronary atherosclerosis 05/01/2009  . CARDIOMYOPATHY, ISCHEMIC 05/01/2009  .  Chronic ischemic heart disease  05/01/2009    Palliative Care Assessment & Plan   Patient Profile: 68 y.o. female  with past medical history of ICM EF 15-20%, home milrinone, systolic CHF, MI, HTN, HLD, CKD, COPD, past smoker, depression, PAD admitted on 05/31/2019 with shortness of breath and fluid overload. Patient is not a candidate for LVAD due to co-morbidities. Dr. Haroldine Laws also discussed LVAD with [facility], who did not feel she is a candidate with underlying co-morbidities. Milrinone stopped and switched to dobutamine infusion inpatient. Per attending, can consider trial of home dobutamine versus hospice services. Palliative medicine consultation for goals of care.   Assessment: Acute on chronic systolic heart failure Ischemic cardiomyopathy EF 15-20% Acute on chronic CKD stage IV Hx VT with ICD firing CAD PAD with intermittent claudication Hypotension Abdominal pain, ?mesenteric ischemia is etiology Comfort care.   Recommendations/Plan:  MOST form completed with patient and daughter 6/25. Patient's wishes include: DNR/DNI, comfort focused care, ABX/IVF for time trial if indicated, and NO feeding tube.  Discussed with patient and daughter about continuing with comfort measures. Discussed about up titration of morphine drip, could also consider opioid rotation to Dilaudid drip if morphine not working well, also discussed about d/c dobutamine today, patient and daughter agreeable.   Med history noted, will d/c medications not directly responsible for comfort focused care.   Continue with IV diuretics for now, as a symptom management measures. could d/c when patient is no longer alert/awake.   ICD has been de activated.   Continue in hospital comfort care, patient lives by herself in [place], [place] and daughter lives in [place], [place]. Patient has high risk of ongoing and sudden decline decompensation and death.  Discussed with patient about our goals being to ensure she remains as well as she can for as long as she  can, with a comfort focused approach to her care. Patient and daughter are in agreement.   Code Status: DNR   Code Status Orders  (From admission, onward)         Start     Ordered   06/01/19 1547  Do not attempt resuscitation (DNR)  Continuous    Question Answer Comment  In the event of cardiac or respiratory ARREST Do not call a "code blue"   In the event of cardiac or respiratory ARREST Do not perform Intubation, CPR, defibrillation or ACLS   In the event of cardiac or respiratory ARREST Use medication by any route, position, wound care, and other measures to relive pain and suffering. May use oxygen, suction and manual treatment of airway obstruction as needed for comfort.      06/01/19 1547        Code Status History    Date Active Date Inactive Code Status Order ID Comments User Context   05/07/2019 2353 05/09/2019 1806 Full Code [id number]  Meade Maw, MD Inpatient   12/22/2018 1202 12/23/2018 2312 Full Code [id number]  Jolaine Artist, MD Inpatient   06/16/2018 0128 06/28/2018 1651 Full Code [id number]  Ivor Costa, MD ED   06/04/2018 0237 06/08/2018 1601 Full Code [id number]  Self, Sherlyn Lees, MD ED   03/02/2018 1119 03/02/2018 1817 Full Code [id number]  Wellington Hampshire, MD Inpatient   01/14/2018 1923 01/17/2018 1612 Full Code [id number]  Patrecia Pour, MD ED   07/26/2014 1248 07/26/2014 1954 Full Code [id number]  Bensimhon, Shaune Pascal, MD Inpatient   Advance Care Planning Activity    Advance Directive Documentation     Most  Recent Value  Type of Advance Directive  Healthcare Power of Attorney  Pre-existing out of facility DNR order (yellow form or pink MOST form)  -  "MOST" Form in Place?  -       Prognosis:  ? Days  Discharge Planning:  Anticipated Hospital Death  Care plan was discussed with patient, daughter, RN   Thank you for allowing the Palliative Medicine Team to assist in the care of this patient.   Time In: 9   Time Out: 9.35 Total Time 35 Prolonged Time  Billed        Greater than 50%  of this time was spent counseling and coordinating care related to the above assessment and plan.  Loistine Chance MD Palliative Medicine Team  Phone: [phone] Fax: [phone]  Please contact Palliative Medicine Team phone at [phone] for questions and concerns.

## 2019-06-07 NOTE — Progress Notes (Signed)
On call MD - Cardiology notified of patient's passing.  Family members just left unit.

## 2019-06-07 NOTE — Progress Notes (Signed)
Patient ID: [name], female   DOB: [birth year], 68 y.o.   MRN: [id number]     Advanced Heart Failure Rounding Note  PCP-Cardiologist: Jenkins Rouge, MD   Subjective:    Patient is now full comfort care.  Dobutamine stopped this morning.  Palliative care following.  Currently alert/oriented and talking with family. SBP 70s-90s  Objective:   Weight Range: 52.8 kg Body mass index is 20.62 kg/m.   Vital Signs:   Temp:  [97.6 F (36.4 C)] 97.6 F (36.4 C) (06/26 1451) Pulse Rate:  [64-89] 67 (06/26 1936) Resp:  [16-24] 17 (06/27 0723) BP: (68-98)/(47-57) 98/57 (06/27 0600) SpO2:  [97 %-100 %] 97 % (06/27 0723) Last BM Date: 05/29/2019  Weight change: Filed Weights   05/31/19 0329 06/01/19 0330 06/02/19 0432  Weight: 52.8 kg 52.5 kg 52.8 kg    Intake/Output:   Intake/Output Summary (Last 24 hours) at 06-29-19 1045 Last data filed at June 29, 2019 0400 Gross per 24 hour  Intake 534.77 ml  Output 200 ml  Net 334.77 ml      Physical Exam  General: NAD Neck: JVP 12+ cm, no thyromegaly or thyroid nodule.  Lungs: Decreased BS at bases.  CV: Lateral PMI.  Heart regular S1/S2, no S3/S4, no murmur.  No edema.   Abdomen: Soft, nontender, no hepatosplenomegaly, no distention.  Skin: Intact without lesions or rashes.  Neurologic: Alert and oriented x 3.  Psych: Normal affect. Extremities: No clubbing or cyanosis.  HEENT: Normal.    Telemetry   Telemetry off   EKG    N/A   Labs    CBC No results for input(s): WBC, NEUTROABS, HGB, HCT, MCV, PLT in the last 72 hours. Basic Metabolic Panel Recent Labs    06/01/19 0547 06/02/19 0345  NA 133* 129*  K 4.5 4.7  CL 97* 94*  CO2 22 23  GLUCOSE 122* 215*  BUN 54* 56*  CREATININE 2.69* 2.65*  CALCIUM 9.5 9.2   Liver Function Tests No results for input(s): AST, ALT, ALKPHOS, BILITOT, PROT, ALBUMIN in the last 72 hours. No results for input(s): LIPASE, AMYLASE in the last 72 hours. Cardiac Enzymes No results for  input(s): CKTOTAL, CKMB, CKMBINDEX, TROPONINI in the last 72 hours.  BNP: BNP (last 3 results) Recent Labs    05/08/19 0634 05/17/19 1040 05/09/2019 2053  BNP 1,026.9* 655.9* 1,314.5*    ProBNP (last 3 results) No results for input(s): PROBNP in the last 8760 hours.   D-Dimer No results for input(s): DDIMER in the last 72 hours. Hemoglobin A1C No results for input(s): HGBA1C in the last 72 hours. Fasting Lipid Panel No results for input(s): CHOL, HDL, LDLCALC, TRIG, CHOLHDL, LDLDIRECT in the last 72 hours. Thyroid Function Tests No results for input(s): TSH, T4TOTAL, T3FREE, THYROIDAB in the last 72 hours.  Invalid input(s): FREET3  Other results:   Imaging    No results found.   Medications:     Scheduled Medications: . aspirin EC  81 mg Oral Daily  . azelastine  1 spray Each Nare Daily  . citalopram  20 mg Oral QHS  . furosemide  80 mg Intravenous BID  . isosorbide mononitrate  30 mg Oral Daily  . mexiletine  150 mg Oral BID  . mometasone-formoterol  2 puff Inhalation BID  . pantoprazole  40 mg Oral Daily  . polyethylene glycol  17 g Oral QHS  . potassium chloride SA  40 mEq Oral Daily    Infusions: . midazolam Stopped (06/02/19 2223)  .  morphine 4 mg/hr (09-Jun-2019 0838)    PRN Medications: acetaminophen, albuterol, glycopyrrolate, loratadine, LORazepam, morphine injection, nitroGLYCERIN, ondansetron (ZOFRAN) IV, simethicone    Patient Profile   Terri E Biggsis a 68 y.o.femalewith a history of CAD, s/p prior Ant MI, s/p prior POBA to LAD, Dx, RCA, CHF due to ICM with high scar burden by MRI (12/09), s/p ICD, HTN, HL, VT, depression,andtobacco abuse.  Admitted on home milrione 0.25 mcg with marked volume overload, transitioned to dobutamine.   Assessment/Plan   End stage low output HF without further options for treatment. Now with comfort measures and ICD off.  We have turned dobutamine off this morning.  Currently stable and talking with  family.  Will arrange for transfer to [facility].  Will see how she does, whether she can get home or to [facility] place or will need to stay here in hospital. Palliative care service following.    Loralie Champagne 2019/06/09 10:45 AM

## 2019-06-07 NOTE — Progress Notes (Signed)
Increased morphine gtt to 2 mg/hr per patient request.  Will return to titrate as needed based upon patient's needs.

## 2019-06-07 NOTE — Progress Notes (Signed)
Patient reports that she is "comfortable" at this time and requested that the morphine gtt be left at its current settings. Morphine gtt remains at 1 mg/hr. Patient is relaxed with HR in 80's and RR of 17.  Will continue to monitor and titrate gtt as necessary according to patient's pain level.

## 2019-06-07 NOTE — Progress Notes (Signed)
Pt very sleepy with periods of not breathing. Sisters at bedside. Morphine rate lowered due to family fills this sleepiness is from Ativan given before arriving to unit.

## 2019-06-07 NOTE — Progress Notes (Signed)
Patient passed at 2250 confirmed by 2 RN's.  Patient's family member present.  MD to be notified.  Morphine IV drip was wasted by Ivin Booty, RN and Blanchard Mane, RN 40 mL in steric-ycle.

## 2019-06-07 DEATH — deceased

## 2019-06-08 ENCOUNTER — Encounter (HOSPITAL_COMMUNITY): Payer: Self-pay | Admitting: *Deleted

## 2019-06-08 NOTE — Progress Notes (Signed)
Received death certificate from [facility] H[facility], form completed and signed by Dr Haroldine Laws, funeral aware and state they will p/u, left at front desk, copy made for chart.

## 2019-06-13 ENCOUNTER — Encounter (HOSPITAL_COMMUNITY): Payer: Medicare Other | Admitting: Internal Medicine

## 2019-06-23 ENCOUNTER — Telehealth (HOSPITAL_COMMUNITY): Payer: Self-pay

## 2019-06-23 NOTE — Telephone Encounter (Signed)
Plan of care signed for advance home care, to be picked up by [facility]

## 2019-06-24 ENCOUNTER — Other Ambulatory Visit (HOSPITAL_COMMUNITY): Payer: Self-pay | Admitting: Adult Health

## 2019-06-28 ENCOUNTER — Encounter (HOSPITAL_COMMUNITY): Payer: Medicare Other | Admitting: Internal Medicine

## 2019-07-08 NOTE — Discharge Summary (Signed)
Patient passed away on Jun 28, 2019. Was on comfort care at time of passing.

## 2019-08-08 NOTE — Discharge Summary (Addendum)
  Advanced Heart Failure Death Summary  Death Summary   Patient ID: [name] MRN: [id number], DOB/AGE: 01-16-1951 68 y.o. Admit date: 06/01/2019 D/C date:    2019/06/05   Primary Discharge Diagnoses:  1. A/C End Stage Systolic HF, ICM  2. Cardiogenic Shock 3 CKD Stage IV 4. COPD 5. H/O VT 6. CAD  7. PAD 8. Bilateral Carotid Stenosis 9. Hyperlipidemia   Hospital Course:  [name] a  68 y.o.femalewith a history of CAD, s/p prior Ant MI, s/p prior POBA to LAD, Dx, RCA, CHF due to ICM with high scar burden by MRI (12/09), s/p ICD, HTN, HL,VT,depression,andtobacco abuse. She had been seen by VAD team recently and felt not to be VAD candidate due to AKI and COPD.  Admitted on home milrione 0.25 mcg with marked volume overload and dyspnea. Mixed venous saturation was severely reduced on admit so milrinone was stopped and dobutamine was started. Initially she diuresed with IV lasix and mixed venous saturation improving.  A few days later mixed venous saturation went back down to 40%. She became hypotensive with nausea and abdominal pain which was consistent with recurrent cardiogenic shock. Given she was not a candidate for advanced therapies she elected to transition to comfort care.   [facility] was contacted for VAD but she was not a candidate for high risk VAD due to co-morbidities with AKI and COPD.  Palliative Care consulted for [name]. On 6/26 she transitioned to comfort care. Dobutamine was stopped and ICD was deactivated. She was started on pain medications and transferred to [facility]. She passed on 6/272020 with family at her bedside.    Duration of Discharge Encounter: Greater than 35 minutes   Signed, Darrick Grinder, NP 07/26/2019, 9:49 AM

## 2019-08-15 ENCOUNTER — Ambulatory Visit: Payer: Medicare Other | Admitting: Cardiovascular Disease
